# Patient Record
Sex: Female | Born: 1980 | Race: White | Hispanic: No | Marital: Single | Smoking: Former smoker
Health system: Southern US, Community
[De-identification: ages and names within clinical notes are randomized; demographics above are authoritative.]

## PROBLEM LIST (undated history)

## (undated) ENCOUNTER — Emergency Department (HOSPITAL_COMMUNITY): Discharge status: Absent without leave | Payer: Medicaid Other

## (undated) DIAGNOSIS — R112 Nausea with vomiting, unspecified: Secondary | ICD-10-CM

## (undated) DIAGNOSIS — N390 Urinary tract infection, site not specified: Secondary | ICD-10-CM

## (undated) DIAGNOSIS — R12 Heartburn: Secondary | ICD-10-CM

## (undated) DIAGNOSIS — N312 Flaccid neuropathic bladder, not elsewhere classified: Secondary | ICD-10-CM

## (undated) DIAGNOSIS — D699 Hemorrhagic condition, unspecified: Secondary | ICD-10-CM

## (undated) DIAGNOSIS — I10 Essential (primary) hypertension: Secondary | ICD-10-CM

## (undated) DIAGNOSIS — Z973 Presence of spectacles and contact lenses: Secondary | ICD-10-CM

## (undated) DIAGNOSIS — F419 Anxiety disorder, unspecified: Secondary | ICD-10-CM

## (undated) DIAGNOSIS — L8990 Pressure ulcer of unspecified site, unspecified stage: Secondary | ICD-10-CM

## (undated) DIAGNOSIS — M62838 Other muscle spasm: Secondary | ICD-10-CM

## (undated) DIAGNOSIS — A419 Sepsis, unspecified organism: Secondary | ICD-10-CM

## (undated) DIAGNOSIS — Q02 Microcephaly: Secondary | ICD-10-CM

## (undated) DIAGNOSIS — D509 Iron deficiency anemia, unspecified: Secondary | ICD-10-CM

## (undated) DIAGNOSIS — E871 Hypo-osmolality and hyponatremia: Secondary | ICD-10-CM

## (undated) DIAGNOSIS — E039 Hypothyroidism, unspecified: Secondary | ICD-10-CM

## (undated) DIAGNOSIS — G839 Paralytic syndrome, unspecified: Secondary | ICD-10-CM

## (undated) DIAGNOSIS — J189 Pneumonia, unspecified organism: Secondary | ICD-10-CM

## (undated) DIAGNOSIS — Z9889 Other specified postprocedural states: Secondary | ICD-10-CM

## (undated) DIAGNOSIS — E785 Hyperlipidemia, unspecified: Secondary | ICD-10-CM

## (undated) DIAGNOSIS — K029 Dental caries, unspecified: Secondary | ICD-10-CM

## (undated) DIAGNOSIS — M4628 Osteomyelitis of vertebra, sacral and sacrococcygeal region: Secondary | ICD-10-CM

## (undated) DIAGNOSIS — G629 Polyneuropathy, unspecified: Secondary | ICD-10-CM

## (undated) DIAGNOSIS — F32A Depression, unspecified: Secondary | ICD-10-CM

## (undated) DIAGNOSIS — K8050 Calculus of bile duct without cholangitis or cholecystitis without obstruction: Secondary | ICD-10-CM

## (undated) DIAGNOSIS — F329 Major depressive disorder, single episode, unspecified: Secondary | ICD-10-CM

## (undated) DIAGNOSIS — E079 Disorder of thyroid, unspecified: Secondary | ICD-10-CM

## (undated) DIAGNOSIS — R488 Other symbolic dysfunctions: Secondary | ICD-10-CM

## (undated) DIAGNOSIS — K219 Gastro-esophageal reflux disease without esophagitis: Secondary | ICD-10-CM

## (undated) DIAGNOSIS — N83201 Unspecified ovarian cyst, right side: Secondary | ICD-10-CM

## (undated) DIAGNOSIS — G8220 Paraplegia, unspecified: Secondary | ICD-10-CM

## (undated) DIAGNOSIS — K592 Neurogenic bowel, not elsewhere classified: Secondary | ICD-10-CM

## (undated) DIAGNOSIS — M5030 Other cervical disc degeneration, unspecified cervical region: Secondary | ICD-10-CM

## (undated) DIAGNOSIS — M1990 Unspecified osteoarthritis, unspecified site: Secondary | ICD-10-CM

## (undated) DIAGNOSIS — N83202 Unspecified ovarian cyst, left side: Secondary | ICD-10-CM

## (undated) DIAGNOSIS — M5020 Other cervical disc displacement, unspecified cervical region: Secondary | ICD-10-CM

## (undated) HISTORY — DX: Depression, unspecified: F32.A

## (undated) HISTORY — DX: Hyperlipidemia, unspecified: E78.5

## (undated) HISTORY — PX: ADENOIDECTOMY: SUR15

## (undated) HISTORY — PX: TONSILLECTOMY AND ADENOIDECTOMY: SUR1326

## (undated) HISTORY — DX: Heartburn: R12

## (undated) HISTORY — DX: Hemorrhagic condition, unspecified: D69.9

## (undated) HISTORY — PX: INTRATHECAL PUMP IMPLANTATION: SHX1844

## (undated) HISTORY — DX: Major depressive disorder, single episode, unspecified: F32.9

## (undated) HISTORY — PX: BACK SURGERY: SHX140

## (undated) HISTORY — DX: Disorder of thyroid, unspecified: E07.9

## (undated) HISTORY — DX: Morbid (severe) obesity due to excess calories: E66.01

## (undated) HISTORY — PX: OTHER SURGICAL HISTORY: SHX169

## (undated) HISTORY — PX: APPENDECTOMY: SHX54

## (undated) HISTORY — PX: ABDOMINAL HYSTERECTOMY: SHX81

## (undated) HISTORY — PX: OVARIAN CYST REMOVAL: SHX89

---
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
## 2001-10-13 ENCOUNTER — Other Ambulatory Visit: Admission: RE | Admit: 2001-10-13 | Discharge: 2001-10-13 | Payer: Self-pay | Admitting: Obstetrics and Gynecology

## 2003-01-18 ENCOUNTER — Emergency Department (HOSPITAL_COMMUNITY): Admission: EM | Admit: 2003-01-18 | Discharge: 2003-01-19 | Payer: Self-pay | Admitting: Emergency Medicine

## 2003-02-03 ENCOUNTER — Encounter (INDEPENDENT_AMBULATORY_CARE_PROVIDER_SITE_OTHER): Payer: Self-pay | Admitting: Specialist

## 2003-02-03 ENCOUNTER — Ambulatory Visit (HOSPITAL_COMMUNITY): Admission: RE | Admit: 2003-02-03 | Discharge: 2003-02-03 | Payer: Self-pay | Admitting: Gastroenterology

## 2003-02-16 ENCOUNTER — Encounter: Admission: RE | Admit: 2003-02-16 | Discharge: 2003-02-16 | Payer: Self-pay | Admitting: Gastroenterology

## 2003-04-28 ENCOUNTER — Ambulatory Visit (HOSPITAL_COMMUNITY): Admission: RE | Admit: 2003-04-28 | Discharge: 2003-04-28 | Payer: Self-pay | Admitting: *Deleted

## 2003-06-20 ENCOUNTER — Other Ambulatory Visit: Admission: RE | Admit: 2003-06-20 | Discharge: 2003-06-20 | Payer: Self-pay | Admitting: *Deleted

## 2004-08-13 ENCOUNTER — Other Ambulatory Visit: Admission: RE | Admit: 2004-08-13 | Discharge: 2004-08-13 | Payer: Self-pay | Admitting: *Deleted

## 2005-07-16 ENCOUNTER — Encounter: Admission: RE | Admit: 2005-07-16 | Discharge: 2005-07-16 | Payer: Self-pay | Admitting: *Deleted

## 2005-08-27 ENCOUNTER — Other Ambulatory Visit: Admission: RE | Admit: 2005-08-27 | Discharge: 2005-08-27 | Payer: Self-pay | Admitting: *Deleted

## 2006-09-02 ENCOUNTER — Other Ambulatory Visit: Admission: RE | Admit: 2006-09-02 | Discharge: 2006-09-02 | Payer: Self-pay | Admitting: *Deleted

## 2007-09-06 ENCOUNTER — Emergency Department (HOSPITAL_COMMUNITY): Admission: EM | Admit: 2007-09-06 | Discharge: 2007-09-07 | Payer: Self-pay | Admitting: Emergency Medicine

## 2007-09-25 ENCOUNTER — Encounter: Payer: Self-pay | Admitting: Orthopedic Surgery

## 2007-09-25 ENCOUNTER — Emergency Department (HOSPITAL_COMMUNITY): Admission: EM | Admit: 2007-09-25 | Discharge: 2007-09-25 | Payer: Self-pay | Admitting: Emergency Medicine

## 2007-09-29 ENCOUNTER — Ambulatory Visit: Payer: Self-pay | Admitting: Orthopedic Surgery

## 2007-10-18 ENCOUNTER — Ambulatory Visit: Payer: Self-pay | Admitting: Orthopedic Surgery

## 2008-02-24 ENCOUNTER — Ambulatory Visit: Payer: Self-pay | Admitting: Internal Medicine

## 2008-03-30 ENCOUNTER — Other Ambulatory Visit: Admission: RE | Admit: 2008-03-30 | Discharge: 2008-03-30 | Payer: Self-pay | Admitting: Internal Medicine

## 2008-03-30 ENCOUNTER — Ambulatory Visit: Payer: Self-pay | Admitting: Internal Medicine

## 2008-05-26 ENCOUNTER — Ambulatory Visit: Payer: Self-pay | Admitting: Internal Medicine

## 2008-07-13 ENCOUNTER — Ambulatory Visit: Payer: Self-pay | Admitting: Internal Medicine

## 2008-07-13 ENCOUNTER — Encounter: Admission: RE | Admit: 2008-07-13 | Discharge: 2008-07-13 | Payer: Self-pay | Admitting: Internal Medicine

## 2008-09-25 ENCOUNTER — Ambulatory Visit: Payer: Self-pay | Admitting: Internal Medicine

## 2009-04-06 ENCOUNTER — Ambulatory Visit: Payer: Self-pay | Admitting: Internal Medicine

## 2009-09-14 ENCOUNTER — Ambulatory Visit: Payer: Self-pay | Admitting: Internal Medicine

## 2010-03-25 ENCOUNTER — Encounter: Payer: Self-pay | Admitting: Internal Medicine

## 2010-04-01 ENCOUNTER — Ambulatory Visit: Admit: 2010-04-01 | Payer: Self-pay | Admitting: Internal Medicine

## 2010-04-12 ENCOUNTER — Encounter (INDEPENDENT_AMBULATORY_CARE_PROVIDER_SITE_OTHER): Payer: Medicaid Other | Admitting: Internal Medicine

## 2010-04-12 DIAGNOSIS — E039 Hypothyroidism, unspecified: Secondary | ICD-10-CM

## 2010-05-19 ENCOUNTER — Emergency Department (HOSPITAL_COMMUNITY): Payer: Medicaid Other

## 2010-05-19 ENCOUNTER — Emergency Department (HOSPITAL_COMMUNITY)
Admission: EM | Admit: 2010-05-19 | Discharge: 2010-05-19 | Disposition: A | Discharge status: Home / Self Care | Payer: Medicaid Other | Attending: Emergency Medicine | Admitting: Emergency Medicine

## 2010-05-19 DIAGNOSIS — Y92410 Unspecified street and highway as the place of occurrence of the external cause: Secondary | ICD-10-CM | POA: Insufficient documentation

## 2010-05-19 DIAGNOSIS — IMO0002 Reserved for concepts with insufficient information to code with codable children: Secondary | ICD-10-CM | POA: Insufficient documentation

## 2010-05-19 DIAGNOSIS — S0990XA Unspecified injury of head, initial encounter: Secondary | ICD-10-CM | POA: Insufficient documentation

## 2010-05-19 DIAGNOSIS — S5000XA Contusion of unspecified elbow, initial encounter: Secondary | ICD-10-CM | POA: Insufficient documentation

## 2010-05-19 DIAGNOSIS — M549 Dorsalgia, unspecified: Secondary | ICD-10-CM | POA: Insufficient documentation

## 2010-06-27 ENCOUNTER — Ambulatory Visit: Payer: No Typology Code available for payment source | Admitting: Internal Medicine

## 2010-07-19 NOTE — Op Note (Signed)
NAME:  [name], [name]                       ACCOUNT NO.:  [id number]   MEDICAL RECORD NO.:  [id number]                   PATIENT TYPE:  AMB   LOCATION:  SDC                                  FACILITY:  [facility]   PHYSICIAN:  Staples B. Earlene Plater, M.D.               DATE OF BIRTH:  [birth year]   DATE OF PROCEDURE:  04/28/2003  DATE OF DISCHARGE:                                 OPERATIVE REPORT   PREOPERATIVE DIAGNOSIS:  Chronic pelvic pain.   POSTOPERATIVE DIAGNOSIS:  Chronic pelvic pain.   PROCEDURE:  Open diagnostic laparoscopy.   SURGEON:  Chester Holstein. Earlene Plater, M.D.   ANESTHESIA:  General.   FINDINGS:  Normal-appearing uterus, tubes, and ovaries.  Single 1 mm area of  scarring in the right ovarian fossa indicative of possible previous  endometriosis.  Also minor adhesions around the terminal ileum (history of  previous appendectomy).   ESTIMATED BLOOD LOSS:  25 mL.   COMPLICATIONS:  None.   INDICATIONS:  Patient with a long history of lower abdominal pain, previous  appendectomy, and previous history of presumed ileitis based on  leukocytosis, abdominal pain, fever, and findings on CT scan.  These  symptoms resolved but had a persistent elevated white count with a normal CT  scan prior to surgery.  Ultrasound was normal.  Informed the patient that  endometriosis could not be ruled out to explain her pain, and she therefore  presented for laparoscopy.   DESCRIPTION OF PROCEDURE:  Patient taken to the operating room and general  anesthesia obtained.  She was prepped and draped in standard fashion and a  Foley catheter used to empty the bladder.  Heparin 5000 units was given  subcu for DVT prophylaxis.  A single-tooth attached to the anterior lip of  the cervix and a Hulka tenaculum inserted and attached.   Attention turned to the abdomen.  A 10 mm vertical infraumbilical skin fold  incision made with a knife and carried sharply to the fascia.  The fascia  was divided sharply and  elevated with the Kocher clamps.  The posterior  sheath and peritoneum were entered sharply.  A pursestring suture of 0  Vicryl placed around the fascial defect, Hasson cannula inserted and  secured, pneumoperitoneum obtained with CO2 gas.   A 5 mm port placed in the left lower quadrant under direct laparoscopic  visualization.   Pelvis and abdomen inspected with the above findings noted.  As there was no  evidence of any active endometriosis, I decided not to treat the single area  that looked like an old scar from endometriosis in the right ovarian fossa.  The adhesions around the terminal ileum are presumably due to previous  appendectomy.  The bowel itself appeared normal, and the adhesions were very  filmy and therefore left untreated.  The upper abdomen appeared normal.  Given there was no active pathology to treat, the procedure was terminated.   The  inferior port was removed and the site inspected with the laparoscope  and found to be hemostatic.  The scope was removed and gas released and  Hasson cannula removed.  I inserted my index finger through the fascial  defect and snugged down the pursestring suture.  This obliterated the  fascial defect and no intra-abdominal contents herniated through prior to  closure.  Skin was closed in each site with 4-0 Vicryl in a subcuticular  fashion.   The alcohol was removed and cervix hemostatic.   The patient tolerated the procedure well and there were no complications.  She was taken to recovery awake and alert, in stable condition.                                               Gerri Spore B. Earlene Plater, M.D.    WBD/MEDQ  D:  04/28/2003  T:  04/28/2003  Job:  [id number]

## 2010-07-19 NOTE — H&P (Signed)
[facility]  Patient:    [name], [name]                    MRN: [id number] Adm. Date:  [id number] Attending:  Kelvin Cellar CC:         Sung Amabile. Roslyn Smiling, M.D.  Lindaann Slough, M.D.  Harlon Ditty, M.D., [facility]   History and Physical  REASON FOR ADMISSION:  Acute abdomen.  BRIEF HISTORY:  Patient is an 30 year old white female with a complex two month history of illness.  She is referred at this time by Dr. Harlon Ditty of [facility].  The patient had initially presented approximately two months ago with sinusitis and cough.  She also developed abdominal pain. She was seen at that time by Dr. Brunilda Payor for cystoscopy which was negative. Patient had persistent abdominal pain and was seen back at [facility].  She underwent CT scan of the abdomen which was nonspecific in its findings.  Patient subsequently had an ultrasound performed for nausea and vomiting that did not demonstrate any biliary disease.  She was evaluated by Dr. Jake Church.  She was placed on oral Cipro for the past two weeks.  She completed her course of antibiotics four days ago.  She now has low grade fever of 99.1 with shaking chills.  White blood cell count is elevated at 17,000 with a left shift.  Patient complains of right lower quadrant abdominal pain.  Patient was seen this evening at [facility] by Dr. Hurley Cisco and referred to me for surgical evaluation here in the [facility] Emergency Department.  She had been seen previously by my partner, Dr. Ovidio Kin, at our office and at [facility] Emergency Department in the past 2-4 weeks.  Patient complains of right lower quadrant abdominal pain and suprapubic pain. She notes nausea and vomiting.  She has had a low grade fever.  She has shaking chills.  She was able to eat a slice of pizza at 2 p.m. today, but shortly thereafter, had emesis.  PAST MEDICAL HISTORY:  Essentially  unremarkable.  MEDICATIONS:  None.  ALLERGIES:  None known.  SOCIAL HISTORY:  Patient is a Holiday representative at [employer].  She lives in [place].  She does not smoke.  She does not drink alcohol.  She does not use drugs.  She is not sexually active.  She is accompanied by her mother and father.  REVIEW OF SYSTEMS:  Fifteen system review without significant other positives.  FAMILY HISTORY:  Notable for colon cancer in the patients grandfather.  PHYSICAL EXAMINATION:  GENERAL:  An 30 year old moderately obese white female on a stretcher in the Emergency Department in moderate discomfort.  VITAL SIGNS:  Temperature 97.4, pulse 88, respirations 18, blood pressure 141/77.  HEENT:  Normocephalic.  Sclerae are clear.  Mucous membranes are dry. Dentition is good.  NECK:  Supple without masses.  Thyroid is normal without nodularity.  LUNGS:  Clear to auscultation bilaterally.  There was no costovertebral angle tenderness.  CARDIAC:  Regular rate and rhythm without murmur.  ABDOMEN:  Soft.  There are a few bowel sounds present.  There is tenderness to percussion in the suprapubic and right lower quadrant.  There is tenderness to palpation, suprapubic, and right lower quadrant.  There are no palpable masses.  There is no hepatosplenomegaly.  There is rebound tenderness in the suprapubic and right lower quadrant areas.  EXTREMITIES:  Nontender without edema.  NEUROLOGIC:  Patient is alert and oriented to person, place, and time without focal neurologic deficit.  LABORATORY STUDIES:  CBC from [facility] done this evening shows a white blood cell count of 17,400 with 73% granulocytes and 20% lymphocytes. Hemoglobin is 12.2, hematocrit 36.8%, platelet count 555,000.  Urinalysis is benign.  Chemistry profile is pending.  RADIOGRAPHIC STUDIES:  None.  IMPRESSION:  Acute abdomen, rule out acute appendicitis versus gynecologic infection.  PLAN: 1.  Admission to [facility]. 2. Initiation of intravenous antibiotics. 3. To operating room for diagnostic laparoscopy and possibly laparoscopic    appendectomy. 4. Routine postoperative care.  I had a lengthy discussion with the patient, her mother, and her father regarding options for further management.  She has had a complicated course over the past two months with intermittent treatment with oral antibiotics. She has had multiple diagnostic studies without a clear cut diagnosis. Patient now presents with abdominal pain, chills, low grade fever, nausea, and vomiting.  I think the best course of action is a direct approach with diagnostic laparoscopy for diagnosis and possible therapeutic intervention for appendectomy.  The alternative to surgery would be a repeat CT scan of the abdomen and clinical observation here in the hospital.  Both the patient, her mother, and her father desire the surgical approach.  We will make arrangements with the operating room at this time.  Risks and benefits of the procedure have been discussed.  Potential for conversion to an open procedure has been discussed and understood.  We will proceed to the operating room on an urgent basis. DD:  01/17/00 TD:  01/18/00 Job: [id number]

## 2010-07-19 NOTE — Op Note (Signed)
[facility]  Patient:    [name], [name]                    MRN: [id number] Proc. Date: 01/17/00 Adm. Date:  [id number] Attending:  Gustavo Lah A CC:         Dr. Harlon Ditty of [facility]  Lindaann Slough, M.D.  Sung Amabile Roslyn Smiling, M.D.   Operative Report  PREOPERATIVE DIAGNOSIS:  Acute abdomen.  POSTOPERATIVE DIAGNOSES: 1. Right ovarian cyst. 2. Normal retrocecal appendix.  PROCEDURE: 1. Diagnostic laparoscopy. 2. Fenestration of right ovarian cyst. 3. Laparoscopic appendectomy.  SURGEON:  Velora Heckler, M.D.  ANESTHESIA:  General.  ESTIMATED BLOOD LOSS:  Minimal.  PREPARATION:  Betadine.  COMPLICATIONS:  None.  INDICATIONS:  The patient is an 30 year old white female with complicated two month history of abdominal pain.  She has been on and off antibiotics.  She has had an extensive workup including CT scan of the abdomen, abdominal ultrasound, and cystoscopy.  The patient now presents four days after discontinuing oral Ciprofloxacin with right lower quadrant abdominal pain, nausea, vomiting, and leukocytosis.  The patient is brought to the operating room for diagnostic laparoscopy.  DESCRIPTION OF PROCEDURE:  The procedure was done at [facility] in operating room #11.  The patient was brought to the operating room and placed in the supine position on the operating room table.  Following the administration of general anesthesia, the patient was prepped and draped in the usual strict aseptic fashion.  After ascertaining that an adequate level of anesthesia had been obtained, a supraumbilical incision was made with a #15 blade.  Dissection was carried down to the fascia.  The fascia was incised in the midline and the peritoneal cavity was entered cautiously.  An 0 Vicryl pursestring suture was placed in the fascia.  A Hasson cannula was introduced under direct vision and secured with a pursestring  suture.  The abdomen was insufflated with carbon dioxide.  The laparoscope was introduced under direct vision and the abdomen explored.  With positioning of the patient and exploration and manipulation of the bowel using a Glassman clamp placed through a right abdominal wall 5 mm port, the appendix was identified.  It is retrocecal.  It is grossly normal.  Terminal ileum is normal.  In the pelvis, the uterus is normal.  There is a large right ovarian cyst.  The left ovary and tube are normal.  Sigmoid colon is normal.  Liver is normal.  Gallbladder is essentially normal.  Left lateral segment of the liver and the stomach are normal.  Spleen is not visualized.  Representative photographs were made for the medical record.  The second operative port is placed in the left lower quadrant.  Peritoneal attachments around the cecum are mobilized.  The appendix is mobilized from its retrocecal location.  A window is created at the base of the appendix.  The base of the appendix was transected with an Endo-GIA stapler.  The appendiceal mesentery is dissected out and the mesentery transected with an Endo-GIA stapler using a vascular load of staples.  Remaining adventitial tissue to the appendix was dissected out using the electrocautery for hemostasis.  The appendix was placed into an Endocatch bag and withdrawn from the peritoneal cavity.  Next, we turned our attention to the pelvis.  The right ovary was elevated. Using the electrocautery, the cyst was fenestrated and clear fluid is evacuated from the right ovarian cyst.  Hemostasis was obtained  with the electrocautery.  Again, representative photographs were made for the medical record.  All ports were then removed under direct vision.  Port sites were anesthetized with local anesthetic.  The Hasson is removed and the 0 Vicryl pursestring suture tied securely.  All three surgical wounds were closed with interrupted 4-0 Vicryl subcuticular  sutures.  The wounds were washed and dried, and Benzoin and Steri-Strips are applied.  Sterile gauze dressings are applied.  The patient is awakened from anesthesia and brought to the recovery room in stable condition.  The patient tolerated the procedure well. DD:  01/17/00 TD:  01/18/00 Job: [id number]

## 2010-07-19 NOTE — H&P (Signed)
[facility]  Patient:    [name], [name]                    MRN: [id number] Adm. Date:  [id number] Disc. Date: [id number] Attending:  Lindaann Slough CC:         Louanna Raw, M.D., [facility], [address] [place], [place]   History and Physical  DATE OF BIRTH: [birth year]  CHIEF COMPLAINT: Ms. Manansala is a 30 year old white female, who goes to [facility] for her primary care.  She has otherwise been healthy up until three to four weeks ago and has had some really irregular periods. She has had some bleeding off and on over the last three or four weeks.  Then approximately five days ago, on December 13, 1999, she developed abdominal pain.  She kind of points to the right lower quadrant.  This pain has been kind of persistent for the past four or five days and got bad enough today in school that she was taken out by her mother and brought to Urgent Care.  She says her appetite has been poor.  She has not been really nauseated to today.  Her bowels have been normal.  She thinks she may have had some fever but she never had any documented febrile episode.  She was seen by Dr. Harlon Ditty at Urgent Care, who felt she may have appendicitis and asked me to see to see her.  She has no gastrointestinal history such as peptic ulcer disease, liver disease, pancreatic disease, or change in bowel habits.  She has had no prior abdominal surgery.  She denies any sexual activity.  She started her first period at age age 80 but, again, has had irregular menstrual periods the last three to four weeks.  Again, she is not sexually active.  ALLERGIES: No known drug allergies.  CURRENT MEDICATIONS: She is on no medications.  REVIEW OF SYSTEMS: Negative for significant pulmonary, cardiac, gastrointestinal, or urologic problems.  She is accompanied by her mother in the emergency room.  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  PHYSICAL EXAMINATION:  VITAL SIGNS: Temperature 98.3 degrees, pulse 87, respirations 20, blood pressure 152/90.  GENERAL: She is a well-nourished, mildly obese white female, alert and cooperative on physical examination.  HEENT: Unremarkable.  NECK: Supple, without masses or thyromegaly.  LUNGS: Clear to auscultation.  HEART: Regular rate and rhythm.  ABDOMEN: Soft.  Bowel sounds noted in all four quadrants.  She is tender in the right lower quadrant.  There is no rebound or guarding but she is tender in that area.  PELVIC: I tried to do a vaginal examination but she really did not tolerate that at all.  RECTAL: I did not do a rectal examination.  EXTREMITIES: Good strength in all four extremities.  LABORATORY DATA: WBC here is 16,100.  IMPRESSION/PLAN: I doubt she has appendicitis.  I think with her presentation and symptoms she would be best served by obtaining a CT scan.  I think this is probably more gynecologic in nature.  The elevated WBC is of concern.  I discussed this with the mother and we will proceed with CT scan.  If negative, she will be discharged home with pain medication and if positive she obviously will need further something further done. DD:  12/18/99 TD:  12/18/99 Job: [id number]

## 2010-07-19 NOTE — Op Note (Signed)
NAME:  [name], [name]                       ACCOUNT NO.:  [id number]   MEDICAL RECORD NO.:  [id number]                   PATIENT TYPE:  AMB   LOCATION:  ENDO                                 FACILITY:  [facility]   PHYSICIAN:  Danise Edge, M.D.                DATE OF BIRTH:  1981/01/25   DATE OF PROCEDURE:  02/04/2003  DATE OF DISCHARGE:                                 OPERATIVE REPORT   PROCEDURE:  Colonoscopy.   INDICATIONS FOR PROCEDURE:  Ms. Lillyonna A. Glasby is a 30 year old female  born 1981/01/20.  On January 18, 2003, Ms. Folsom presented to the  [facility] emergency room with right lower quadrant abdominal  pain, nausea and vomiting.  She was evaluated by Dr. Mancel Bale.  Her  white blood cell count was 20,300, hemoglobin 12.7 g, platelet count  500,000.  Her complete metabolic profile and urinalysis were normal.  Qualitative urine pregnancy test was negative.  RPR was nonreactive.  Cervical screen for yeast, trichomonas, gonorrhea and chlamydia negative.  CT scan of the abdomen and pelvis revealed thickening of the wall in the  terminal ileum and nonspecific mesenteric lymphadenopathy.  Ms. Creek was  discharged from the emergency room on Cipro and I evaluated her in my office  January 24, 2003.  She reported right lower quadrant abdominal pain and  back pain.  She also has abnormal menses.  She denied dysuria, hematuria,  gastrointestinal bleeding, diarrhea or constipation.  She was only taking  Cipro.   In July 2000, Kristel developed predominantly right lower quadrant abdominal  pain.  Her September 05, 1999 intravenous pyelogram was normal.  Her September 11, 1999  and Jul 17, 2000 abdominal ultrasounds were normal.  Her October 01, 1999,  December 18, 1999 and Jul 23, 2000 CT scan of the abdomen and pelvis were  normal.   On September 13, 1999, she underwent cystoscopy performed by Lindaann Slough,  M.D. to evaluate hematuria and the exam was normal.   On  January 17, 2000, Ms. Casco underwent a diagnostic laparoscopy with  appendectomy and fenestration of a large right ovarian cyst performed by  Velora Heckler, M.D. and Sung Amabile. Roslyn Smiling, M.D.   ALLERGIES:  No known drug allergies.   CURRENT MEDICATIONS:  None.   FAMILY HISTORY:  Colon cancer in grandparents.  I performed a colonoscopy on  Julieta's father and his exam was normal.   HABITS:  Teanna does not smoke cigarettes or consume alcohol.   ENDOSCOPIST:  Danise Edge, M.D.   PREMEDICATION:  Versed 10 mg, Demerol 100 mg.   DESCRIPTION OF PROCEDURE:  After obtaining informed consent, Ms. Santarelli was  placed in the left lateral decubitus position. I administered intravenous  Demerol and intravenous Versed to achieve conscious sedation for the  procedure. The patient's blood pressure, oxygen saturation and cardiac  rhythm were monitored throughout the procedure and documented in the medical  record.   Anal inspection was normal. Digital rectal exam was normal. The Olympus  adjustable pediatric colonoscope was introduced into the rectum and advanced  to the cecum.  The lip of the ileocecal valve had a bumpy mucosal appearance  without inflammation.  The ileocecal valve was intubated and the distal  ileum inspected.  The ileal mucosa appeared normal and biopsies were taken.  Colonic preparation for the exam today was excellent.   RECTUM:  Normal.   SIGMOID COLON AND DESCENDING COLON:  Normal.   SPLENIC FLEXURE:  Normal.   TRANSVERSE COLON:  Normal.   HEPATIC FLEXURE:  Normal.   ASCENDING COLON:  Normal.   CECUM:  Normal.   ILEOCECAL VALVE:  The inferior lip of the ileocecal valve had a bumpy  mucosal appearance without inflammation or erosion.   DISTAL ILEUM:  The distal ileal mucosa did not appear inflamed.  The bumpy  mucosal appearance was most consistent with lymphoid hyperplasia.  Multiple  biopsies were taken from the distal ileum and along the inferior lip of  the  ileocecal valve.   ASSESSMENT:  Essentially normal colonoscopy with distal ileoscopy in a  patient whose CT scan shows distal ileitis and whose white blood cell count  in the [facility] Emergency Room January 18, 2003 was 20,300.   PLAN:  Biopsies then performed looking for signs of Crohns disease.  She  could have had an infectious ileitis which prompted her emergency room visit  and has resolved after her course of Cipro.  If her biopsies from the distal  ileum are normal and she continues to have pain, I will schedule her for a  capsule enteroscopy looking for signs of Crohns disease.                                               Danise Edge, M.D.    MJ/MEDQ  D:  02/03/2003  T:  02/04/2003  Job:  [id number]   cc:   Sung Amabile. Roslyn Smiling, M.D.  [address]  [place]  [place] [address]  Fax: [phone]   Lindaann Slough, M.D.  [address]  [place]  [place] [address]  Fax: [phone]   Velora Heckler, M.D.  [address] [place]  [place] [address]  Fax: [phone]

## 2010-07-19 NOTE — Discharge Summary (Signed)
[facility]  Patient:    [name], [name]                    MRN: [id number] Adm. Date:  [id number] Disc. Date: 01/02/00 Attending:  Kelvin Cellar CC:         Sung Amabile. Roslyn Smiling, M.D.  Lindaann Slough, M.D.  Harlon Ditty, M.D., [facility]   Discharge Summary  REASON FOR ADMISSION:  Abdominal pain, rule out acute abdomen.  BRIEF HISTORY:  The patient is an 30 year old white female with a complicated two month history of abdominal pain.  The patient has had an extensive work-up including CT scan of the abdomen, ultrasound of abdomen, and cystoscopy.  The patient has been on and off of antibiotics.  She has been seen in consultation by urology and gynecology as well as general surgery.  The patient returns to the emergency room on January 17, 2000, after being off of Cipro oral antibiotics for four days.   She has developed a low-grade fever of 99.1 with a white count of 17,000.  She complains of right lower quadrant abdominal pain.  General surgery is again called to see the patient, and the patient is admitted on the general surgical service.  HOSPITAL COURSE:  The patient was admitted on January 17, 2000, and taken to the operating room where she underwent diagnostic laparoscopy.  Findings at surgery included a normal retrocecal appendix which was removed.  There was a large right ovarian cyst which was fenestrated.  Postoperative course was straightforward.  The patient had some initial nausea.  She tolerated clear liquids.  She was advanced to a regular diet.  She was prepared for discharge home on the second postoperative day.  DISCHARGE PLANNING:  The patient is discharged home on January 19, 2000.  She is tolerating a regular diet and ambulating independently.  She will be seen back at my office at [facility] in two weeks.  DISCHARGE MEDICATIONS:  Vicodin as needed for pain.  FINAL DIAGNOSES: 1. Normal  appendix. 2. Large right ovarian cyst.  CONDITION ON DISCHARGE:  Improved. DD:  01/19/00 TD:  01/19/00 Job: [id number]

## 2010-07-19 NOTE — Op Note (Signed)
[place]  Patient:    [name], [name]                    MRN: [id number] Proc. Date: 09/13/99 Adm. Date:  [id number] Attending:  Lindaann Slough                           Operative Report  PREOPERATIVE DIAGNOSIS:  Gross hematuria.  POSTOPERATIVE DIAGNOSIS:  Gross hematuria.  OPERATION PERFORMED:  Cystoscopy.  SURGEON:  Lindaann Slough, M.D.  ANESTHESIA:  INDICATIONS FOR PROCEDURE:  The patient is a 30 year old female who had been complaining of right flank pain and gross hematuria.  An IVP showed normal upper tracts.  She is now scheduled for cystoscopy.  DESCRIPTION OF PROCEDURE:  A #17 Wappler cystoscope was inserted in the bladder.  The bladder mucosa is normal.  There is no stone or tumor in the bladder.  The ureteral orifices are in normal position and shape with clear efflux.  There was no evidence of submucosal hemorrhage.  The bladder was emptied and the cystoscope removed.  The patient tolerated the procedure well and left the operating room in satifactory condition to post anesthesia care unit. DD:  09/13/99 TD:  09/13/99 Job: [id number]

## 2010-07-19 NOTE — H&P (Signed)
NAME:  [name], CHERAMIE                       ACCOUNT NO.:  [id number]   MEDICAL RECORD NO.:  [id number]                   PATIENT TYPE:  AMB   LOCATION:  SDC                                  FACILITY:  [facility]   PHYSICIAN:  [place] B. Earlene Plater, M.D.               DATE OF BIRTH:  1980-04-18   DATE OF ADMISSION:  DATE OF DISCHARGE:                                HISTORY & PHYSICAL   DATE OF PROCEDURE:  April 28, 2003   PREOPERATIVE DIAGNOSIS:  Chronic pelvic pain.   INTENDED PROCEDURE:  Diagnostic laparoscopy, possible CO2 laser ablation of  endometriosis.   HISTORY OF PRESENT ILLNESS:  A 30 year old white female gravida 0 with a  long history (over 2 years) of bilateral lower quadrant pain.  History of  recurrent ovarian cysts.  Also recently with a history of possible ileitis  diagnosed by leukocytosis and CT findings.  This was treated with Cipro and  the CT scan showed resolution of her ileitis.  However, she had a  persistently-elevated leukocytosis that has otherwise been unexplained at  this point.  Most recent CT scan was April 07, 2003 and showed no  abnormalities.  Given the persistent nature of the patient's pain and  unclear diagnosis she presents for a diagnostic laparoscopy.   PAST MEDICAL HISTORY:  Obesity and developmental delay.  The patient's  mother is intimately involved in her health care and decision making.   PAST SURGICAL HISTORY:  Appendectomy in November 2001.   FAMILY HISTORY:  Colon cancer.   SOCIAL HISTORY:  The patient smokes.  No alcohol or other drugs.  Family  history otherwise negative.   REVIEW OF SYSTEMS:  Otherwise negative.   PHYSICAL EXAMINATION:  VITAL SIGNS:  Blood pressure 120/84, weight 256,  height 5 feet 4 inches.  GENERAL:  Alert and oriented, no acute distress.  SKIN:  Warm and dry, no lesions.  HEART:  Regular rate and rhythm.  LUNGS:  Clear to auscultation.  ABDOMEN:  Liver and spleen normal, no hernia.  PELVIC:  Normal  external genitalia, vagina and cervix normal.  Uterus normal  size, nontender.  No adnexal masses or tenderness.   ASSESSMENT:  Persistent bilateral lower quadrant abdominal pain, cannot rule  out gynecological condition such as endometriosis or pelvic scarring.  The  patient desires definitive diagnosis if possible.   PLAN:  Diagnostic laparoscopy, CO2 ablation of endometriosis if identified.  Operative risks discussed including infection; bleeding; damage to bowel,  bladder, or surrounding organs.  All questions answered.  The patient wishes  to proceed.                                               Gerri Spore B. Earlene Plater, M.D.    WBD/MEDQ  D:  04/28/2003  T:  04/28/2003  Job:  [id number]

## 2010-08-02 ENCOUNTER — Ambulatory Visit: Payer: No Typology Code available for payment source | Admitting: Internal Medicine

## 2010-08-06 ENCOUNTER — Encounter: Payer: Self-pay | Admitting: Internal Medicine

## 2010-08-06 ENCOUNTER — Ambulatory Visit (INDEPENDENT_AMBULATORY_CARE_PROVIDER_SITE_OTHER): Payer: No Typology Code available for payment source | Admitting: Internal Medicine

## 2010-08-06 VITALS — BP 106/82 | HR 84 | Temp 97.6°F | Ht 65.75 in | Wt 258.0 lb

## 2010-08-06 DIAGNOSIS — E039 Hypothyroidism, unspecified: Secondary | ICD-10-CM | POA: Insufficient documentation

## 2010-08-06 DIAGNOSIS — F339 Major depressive disorder, recurrent, unspecified: Secondary | ICD-10-CM | POA: Insufficient documentation

## 2010-08-06 DIAGNOSIS — F329 Major depressive disorder, single episode, unspecified: Secondary | ICD-10-CM

## 2010-08-06 DIAGNOSIS — F70 Mild intellectual disabilities: Secondary | ICD-10-CM | POA: Insufficient documentation

## 2010-08-06 DIAGNOSIS — E669 Obesity, unspecified: Secondary | ICD-10-CM | POA: Insufficient documentation

## 2010-08-06 DIAGNOSIS — F17200 Nicotine dependence, unspecified, uncomplicated: Secondary | ICD-10-CM | POA: Insufficient documentation

## 2010-08-06 LAB — TSH: TSH: 4.307 u[IU]/mL (ref 0.350–4.500)

## 2010-08-06 NOTE — Patient Instructions (Signed)
Take meds as directed. Return Feb 2013 for PE

## 2010-08-06 NOTE — Progress Notes (Signed)
  Subjective:    [name], female    DOB: Jan 24, 1981, 30 y.o.   MRN: [id number]  HPI in today for followup of depression and hypothyroidism. Seems to be doing better with regard to depression. At last visit was having considerable lethargy and sleeping long hours with little motivation. She says that she is getting up earlier in the day and trying to help around the house. History of mild mental retardation. She is on disability. Cannot seem to hold a job due to mental handicap.    Review of Systems     Objective:   Physical Exam Neck: no JVD, no thyromegaly; Chest: clear; Cardiac exam: regular rate and rhythm no murmurs appreciated        Assessment & Plan:  1-depression stable on Prozac  2 hypothyroidism TSH drawn and is pending  3-obesity not motivated to diet and exercise  4 cigarette abuse not willing to stop smoking  5-mild mental retardation  Plan return February 2013 for physical examination

## 2010-08-07 ENCOUNTER — Telehealth: Payer: Self-pay

## 2010-08-07 NOTE — Telephone Encounter (Signed)
Patient informed of lab results today. TSH 4.2 . She is to remain on same dose of Synthroid and take it in a.m. On an empty stomach. Return in 4 weeks for TSH again. Scheduled for [facility]

## 2010-09-05 ENCOUNTER — Other Ambulatory Visit: Payer: No Typology Code available for payment source | Admitting: Internal Medicine

## 2010-09-05 DIAGNOSIS — E039 Hypothyroidism, unspecified: Secondary | ICD-10-CM

## 2010-09-05 LAB — TSH: TSH: 5.25 u[IU]/mL — ABNORMAL HIGH (ref 0.350–4.500)

## 2010-10-18 ENCOUNTER — Other Ambulatory Visit: Payer: No Typology Code available for payment source | Admitting: Internal Medicine

## 2010-12-30 ENCOUNTER — Emergency Department (HOSPITAL_COMMUNITY)
Admission: EM | Admit: 2010-12-30 | Discharge: 2010-12-31 | Disposition: A | Discharge status: Home / Self Care | Payer: Medicaid Other | Attending: Emergency Medicine | Admitting: Emergency Medicine

## 2010-12-30 ENCOUNTER — Encounter (HOSPITAL_COMMUNITY): Payer: Self-pay | Admitting: *Deleted

## 2010-12-30 DIAGNOSIS — R109 Unspecified abdominal pain: Secondary | ICD-10-CM | POA: Insufficient documentation

## 2010-12-30 HISTORY — DX: Unspecified ovarian cyst, right side: N83.201

## 2010-12-30 HISTORY — DX: Unspecified ovarian cyst, right side: N83.202

## 2010-12-30 LAB — URINALYSIS, ROUTINE W REFLEX MICROSCOPIC
Bilirubin Urine: NEGATIVE
Glucose, UA: NEGATIVE mg/dL
Hgb urine dipstick: NEGATIVE
Ketones, ur: NEGATIVE mg/dL
Leukocytes, UA: NEGATIVE
Nitrite: NEGATIVE
Protein, ur: NEGATIVE mg/dL
Specific Gravity, Urine: 1.025 (ref 1.005–1.030)
Urobilinogen, UA: 0.2 mg/dL (ref 0.0–1.0)
pH: 6 (ref 5.0–8.0)

## 2010-12-30 LAB — PREGNANCY, URINE: Preg Test, Ur: NEGATIVE

## 2010-12-30 MED ORDER — MORPHINE SULFATE 4 MG/ML IJ SOLN
4.0000 mg | Freq: Once | INTRAMUSCULAR | Status: AC
  Administered 2010-12-30: 4 mg via INTRAVENOUS
  Filled 2010-12-30: qty 1

## 2010-12-30 MED ORDER — ONDANSETRON HCL 4 MG/2ML IJ SOLN
4.0000 mg | Freq: Once | INTRAMUSCULAR | Status: AC
  Administered 2010-12-30: 4 mg via INTRAVENOUS
  Filled 2010-12-30: qty 2

## 2010-12-30 MED ORDER — SODIUM CHLORIDE 0.9 % IV BOLUS (SEPSIS)
1000.0000 mL | Freq: Once | INTRAVENOUS | Status: AC
  Administered 2010-12-30: 1000 mL via INTRAVENOUS

## 2010-12-30 NOTE — ED Notes (Signed)
Family at bedside. Patient wants a glass of water RN notified.

## 2010-12-30 NOTE — ED Notes (Signed)
Pt c/o pain in her lower abdomen that is mostly on the right but shoots over to the left side at times. Pt denies nausea, vomiting, diarrhea, urinary symptoms or vaginal discharge.

## 2010-12-31 LAB — BASIC METABOLIC PANEL
BUN: 3 mg/dL — ABNORMAL LOW (ref 6–23)
CO2: 27 mEq/L (ref 19–32)
Calcium: 9.1 mg/dL (ref 8.4–10.5)
Chloride: 99 mEq/L (ref 96–112)
Creatinine, Ser: 0.65 mg/dL (ref 0.50–1.10)
GFR calc Af Amer: >90 mL/min (ref >90)
GFR calc non Af Amer: >90 mL/min (ref >90)
Glucose, Bld: 92 mg/dL (ref 70–99)
Potassium: 3.9 mEq/L (ref 3.5–5.1)
Sodium: 135 mEq/L (ref 135–145)

## 2010-12-31 LAB — CBC
HCT: 38.9 % (ref 36.0–46.0)
Hemoglobin: 12.7 g/dL (ref 12.0–15.0)
MCH: 27.6 pg (ref 26.0–34.0)
MCHC: 32.6 g/dL (ref 30.0–36.0)
MCV: 84.6 fL (ref 78.0–100.0)
Platelets: 411 10*3/uL — ABNORMAL HIGH (ref 150–400)
RBC: 4.6 MIL/uL (ref 3.87–5.11)
RDW: 13.5 % (ref 11.5–15.5)
WBC: 13 10*3/uL — ABNORMAL HIGH (ref 4.0–10.5)

## 2010-12-31 MED ORDER — KETOROLAC TROMETHAMINE 30 MG/ML IJ SOLN
30.0000 mg | Freq: Once | INTRAMUSCULAR | Status: AC
  Administered 2010-12-31: 30 mg via INTRAVENOUS
  Filled 2010-12-31: qty 1

## 2010-12-31 MED ORDER — HYDROCODONE-ACETAMINOPHEN 5-325 MG PO TABS: 1.0000 | ORAL_TABLET | ORAL | Status: AC | PRN

## 2010-12-31 MED ORDER — HYDROCODONE-ACETAMINOPHEN 5-325 MG PO TABS
1.0000 | ORAL_TABLET | Freq: Once | ORAL | Status: AC
  Administered 2010-12-31: 1 via ORAL
  Filled 2010-12-31: qty 1

## 2010-12-31 MED ORDER — PROMETHAZINE HCL 25 MG PO TABS: 25.0000 mg | ORAL_TABLET | Freq: Four times a day (QID) | ORAL | Status: DC | PRN

## 2010-12-31 NOTE — ED Provider Notes (Signed)
Medical screening examination/treatment/procedure(s) were conducted as a shared visit with non-physician practitioner(s) and myself.  I personally evaluated the patient during the encounter  Raeford Razor, MD 12/31/10 [phone]

## 2010-12-31 NOTE — ED Provider Notes (Signed)
History     CSN: [id number] Arrival date & time: 12/30/2010 10:07 PM   First MD Initiated Contact with Patient 12/30/10 2256      Chief Complaint  Patient presents with  . Abdominal Pain    (Consider location/radiation/quality/duration/timing/severity/associated sxs/prior treatment) Patient is a 30 y.o. female presenting with abdominal pain. The history is provided by the patient.  Abdominal Pain The primary symptoms of the illness include abdominal pain and vaginal bleeding. The primary symptoms of the illness do not include fever, shortness of breath, nausea, vomiting, dysuria or vaginal discharge. Episode onset: She developed pain 1 week ago which has increased over the past 3 days. The onset of the illness was gradual. The problem has been gradually worsening.  The patient states that she believes she is currently not pregnant. The patient has not had a change in bowel habit. Symptoms associated with the illness do not include heartburn, constipation, urgency or frequency. Associated symptoms comments: She denies nausea,  Vomiting and fever.  She reports her pain reminds her her previous history of having ovarian cysts.  She also has endometriosis.  She denies vaginal discharge and is not sexually active..    Past Medical History  Diagnosis Date  . Depression   . Thyroid disease     hypothyroidism  . Endometriosis   . Morbid obesity   . Bilateral ovarian cysts     Past Surgical History  Procedure Date  . Appendectomy   . Tonsillectomy and adenoidectomy   . Laparoscopic ovarian cystectomy 2002    rt ovary  . Adenoidectomy     History reviewed. No pertinent family history.  History  Substance Use Topics  . Smoking status: Current Everyday Smoker  . Smokeless tobacco: Never Used  . Alcohol Use: No    OB History    Grav Para Term Preterm Abortions TAB SAB Ect Mult Living                  Review of Systems  Constitutional: Negative for fever.  HENT: Negative  for congestion, sore throat and neck pain.   Eyes: Negative.   Respiratory: Negative for chest tightness and shortness of breath.   Cardiovascular: Negative for chest pain.  Gastrointestinal: Positive for abdominal pain. Negative for heartburn, nausea, vomiting and constipation.  Genitourinary: Positive for vaginal bleeding. Negative for dysuria, urgency, frequency, vaginal discharge and vaginal pain.  Musculoskeletal: Negative for joint swelling and arthralgias.  Skin: Negative.  Negative for rash and wound.  Neurological: Negative for dizziness, weakness, light-headedness, numbness and headaches.  Hematological: Negative.   Psychiatric/Behavioral: Negative.     Allergies  Review of patient's allergies indicates no known allergies.  Home Medications   Current Outpatient Rx  Name Route Sig Dispense Refill  . LEVOTHYROXINE SODIUM 100 MCG PO TABS Oral Take 100 mcg by mouth daily.      . NORETHINDRONE 0.35 MG PO TABS Oral Take 1 tablet by mouth daily.      [name] CITALOPRAM HYDROBROMIDE 40 MG PO TABS Oral Take 40 mg by mouth daily.      [name] HYDROCODONE-ACETAMINOPHEN 5-325 MG PO TABS Oral Take 1 tablet by mouth every 4 (four) hours as needed for pain. 15 tablet 0  . LEVOTHYROXINE SODIUM 75 MCG PO TABS Oral Take 75 mcg by mouth daily.      [name] PROMETHAZINE HCL 25 MG PO TABS Oral Take 1 tablet (25 mg total) by mouth every 6 (six) hours as needed for nausea. 15 tablet 0  BP 115/59  Pulse 80  Temp(Src) 97.7 F (36.5 C) (Oral)  Resp 16  Ht 5\' 5"  (1.651 m)  Wt 250 lb (113.399 kg)  BMI 41.60 kg/m2  SpO2 99%  LMP 11/06/2010  Physical Exam  Nursing note and vitals reviewed. Constitutional: She is oriented to person, place, and time. She appears well-developed and well-nourished.  HENT:  Head: Normocephalic and atraumatic.  Eyes: Conjunctivae are normal.  Neck: Normal range of motion.  Cardiovascular: Normal rate, regular rhythm, normal heart sounds and intact distal pulses.     Pulmonary/Chest: Effort normal and breath sounds normal. She has no wheezes.  Abdominal: Soft. Bowel sounds are normal. She exhibits no distension and no mass. There is no hepatosplenomegaly. There is tenderness in the right lower quadrant and suprapubic area. There is no rigidity, no rebound, no guarding and no CVA tenderness. No hernia.  Musculoskeletal: Normal range of motion.  Neurological: She is alert and oriented to person, place, and time.  Skin: Skin is warm and dry.  Psychiatric: She has a normal mood and affect.    ED Course  Procedures (including critical care time)  Labs Reviewed  CBC - Abnormal; Notable for the following:    WBC 13.0 (*)    Platelets 411 (*)    All other components within normal limits  BASIC METABOLIC PANEL - Abnormal; Notable for the following:    BUN 3 (*)    All other components within normal limits  URINALYSIS, ROUTINE W REFLEX MICROSCOPIC  PREGNANCY, URINE  URINE CULTURE   No results found.   1. Abdominal pain       MDM  Discussed lab results with patient.  Exam and history most consistent with ovarian cyst.   No GI symptoms in this patient with appendectomy,  H/o ovarian cyst and endometriosis.  Morphine IV given in ed with moderate relief of pain. Added toradol with extra relief obtained.  Encouraged 12 hour f/u here or by pcp  if sx worsened or not improving.          Candis Musa, PA 12/31/10 0118  Candis Musa, PA 12/31/10 [phone]

## 2011-01-01 LAB — URINE CULTURE
Colony Count: 85000
Culture  Setup Time: 201210301356

## 2011-03-18 ENCOUNTER — Other Ambulatory Visit: Payer: Self-pay | Admitting: Internal Medicine

## 2011-04-15 ENCOUNTER — Ambulatory Visit (INDEPENDENT_AMBULATORY_CARE_PROVIDER_SITE_OTHER): Payer: No Typology Code available for payment source | Admitting: Internal Medicine

## 2011-04-15 ENCOUNTER — Encounter: Payer: Self-pay | Admitting: Internal Medicine

## 2011-04-15 DIAGNOSIS — J029 Acute pharyngitis, unspecified: Secondary | ICD-10-CM

## 2011-04-15 DIAGNOSIS — Z0000 Encounter for general adult medical examination without abnormal findings: Secondary | ICD-10-CM

## 2011-04-15 DIAGNOSIS — N912 Amenorrhea, unspecified: Secondary | ICD-10-CM

## 2011-04-15 DIAGNOSIS — E039 Hypothyroidism, unspecified: Secondary | ICD-10-CM

## 2011-04-15 LAB — POCT RAPID STREP A (OFFICE): Rapid Strep A Screen: NEGATIVE

## 2011-04-15 LAB — CBC WITH DIFFERENTIAL/PLATELET
Eosinophils Absolute: 0.4 10*3/uL (ref 0.0–0.7)
HCT: 43.3 % (ref 36.0–46.0)
Hemoglobin: 13.7 g/dL (ref 12.0–15.0)
Lymphs Abs: 3.8 10*3/uL (ref 0.7–4.0)
MCH: 27.4 pg (ref 26.0–34.0)
Monocytes Absolute: 0.8 10*3/uL (ref 0.1–1.0)
Monocytes Relative: 5 % (ref 3–12)
Neutro Abs: 10.5 10*3/uL — ABNORMAL HIGH (ref 1.7–7.7)
Neutrophils Relative %: 68 % (ref 43–77)
RBC: 5 MIL/uL (ref 3.87–5.11)

## 2011-04-15 LAB — POCT URINALYSIS DIPSTICK
Bilirubin, UA: NEGATIVE
Blood, UA: NEGATIVE
Glucose, UA: NEGATIVE
Ketones, UA: NEGATIVE
Nitrite, UA: NEGATIVE

## 2011-04-15 LAB — COMPREHENSIVE METABOLIC PANEL
Albumin: 4.3 g/dL (ref 3.5–5.2)
CO2: 27 mEq/L (ref 19–32)
Calcium: 9.5 mg/dL (ref 8.4–10.5)
Glucose, Bld: 93 mg/dL (ref 70–99)
Potassium: 4.7 mEq/L (ref 3.5–5.3)
Sodium: 141 mEq/L (ref 135–145)
Total Protein: 6.5 g/dL (ref 6.0–8.3)

## 2011-04-16 ENCOUNTER — Encounter: Payer: Self-pay | Admitting: Internal Medicine

## 2011-04-16 LAB — VITAMIN D 25 HYDROXY (VIT D DEFICIENCY, FRACTURES): Vit D, 25-Hydroxy: 11 ng/mL — ABNORMAL LOW (ref 30–89)

## 2011-04-16 LAB — HCG, QUANTITATIVE, PREGNANCY: hCG, Beta Chain, Quant, S: <2 m[IU]/mL
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
## 2011-04-16 NOTE — Patient Instructions (Signed)
Take Zithromax Z-PAK as directed for URI symptoms. Return in 6 months assuming lab work is normal.  Addendum: Decrease Synthroid to 0.88 mg daily and return in 3 months for TSH and office visit. Take Drisdol 50,000 units weekly for 12 weeks followed by 2000 units vitamin D 3 daily. Patient to return in 3 months for office visit, TSH, vitamin D level.

## 2011-04-18 LAB — TSH: TSH: 0.317 u[IU]/mL — ABNORMAL LOW (ref 0.350–4.500)

## 2011-06-17 ENCOUNTER — Other Ambulatory Visit: Payer: Self-pay | Admitting: Internal Medicine

## 2011-07-18 ENCOUNTER — Ambulatory Visit: Payer: Medicaid Other | Admitting: Internal Medicine

## 2011-07-18 ENCOUNTER — Encounter: Payer: Self-pay | Admitting: Internal Medicine

## 2011-08-12 ENCOUNTER — Other Ambulatory Visit: Payer: Self-pay | Admitting: Internal Medicine

## 2011-08-12 NOTE — Telephone Encounter (Signed)
Pt did not keep recent appointment. I am going to deny these refills. We did not have a correct phone number on her as I recall.

## 2011-08-16 ENCOUNTER — Other Ambulatory Visit: Payer: Self-pay | Admitting: Internal Medicine

## 2011-08-17 NOTE — Telephone Encounter (Signed)
Pt missed last appt. Was supposed to have contacted [place] for appt and then we will fill for 30-60 days.We do not have a good phone contact number as I recall. You may try her mother [name] who is also a patient here. Lacye is mildly  Mentally retarded.

## 2011-08-17 NOTE — Telephone Encounter (Signed)
Pt missed last appt. Have denied refills. We did not have a valid phone for her and she is mildly mentally retarded. Her mother is [name] also a patient here. We need to contact her about missed appt and refills. Currently she is off Synthroid. Would prefer to have her back on Synthroid for several weeks. I believe a CPE was missed since OCPs are due for refill as well.

## 2011-08-18 ENCOUNTER — Other Ambulatory Visit: Payer: Self-pay

## 2011-09-02 ENCOUNTER — Ambulatory Visit (INDEPENDENT_AMBULATORY_CARE_PROVIDER_SITE_OTHER): Payer: BC Managed Care – PPO | Admitting: Internal Medicine

## 2011-09-02 ENCOUNTER — Encounter: Payer: Self-pay | Admitting: Internal Medicine

## 2011-09-02 VITALS — BP 124/80 | HR 84 | Temp 98.4°F | Ht 64.5 in | Wt 259.0 lb

## 2011-09-02 DIAGNOSIS — E039 Hypothyroidism, unspecified: Secondary | ICD-10-CM

## 2011-09-02 DIAGNOSIS — F70 Mild intellectual disabilities: Secondary | ICD-10-CM

## 2011-09-02 DIAGNOSIS — Z8639 Personal history of other endocrine, nutritional and metabolic disease: Secondary | ICD-10-CM

## 2011-09-02 DIAGNOSIS — N912 Amenorrhea, unspecified: Secondary | ICD-10-CM

## 2011-09-02 DIAGNOSIS — Z87898 Personal history of other specified conditions: Secondary | ICD-10-CM

## 2011-09-02 DIAGNOSIS — M25519 Pain in unspecified shoulder: Secondary | ICD-10-CM

## 2011-09-02 DIAGNOSIS — E669 Obesity, unspecified: Secondary | ICD-10-CM

## 2011-09-02 DIAGNOSIS — M25512 Pain in left shoulder: Secondary | ICD-10-CM

## 2011-09-02 DIAGNOSIS — Z23 Encounter for immunization: Secondary | ICD-10-CM

## 2011-09-02 NOTE — Patient Instructions (Addendum)
Take Flexeril at bedtime for shoulder pain. Exercises left shoulder several times daily. Apply ice to shoulder 20 minutes twice daily. Take prednisone as directed in tapering course. Return in 3 weeks. Restart Synthroid 0.088 mg daily. Return in 3 weeks for reevaluation.
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
## 2011-09-30 ENCOUNTER — Encounter: Payer: Self-pay | Admitting: Internal Medicine

## 2011-09-30 ENCOUNTER — Ambulatory Visit (INDEPENDENT_AMBULATORY_CARE_PROVIDER_SITE_OTHER): Payer: Medicaid Other | Admitting: Internal Medicine

## 2011-09-30 ENCOUNTER — Ambulatory Visit
Admission: RE | Admit: 2011-09-30 | Discharge: 2011-09-30 | Disposition: A | Discharge status: Home / Self Care | Payer: BC Managed Care – PPO | Source: Ambulatory Visit | Attending: Internal Medicine | Admitting: Internal Medicine

## 2011-09-30 VITALS — BP 110/80 | HR 100 | Temp 98.8°F | Ht 64.5 in | Wt 253.0 lb

## 2011-09-30 DIAGNOSIS — M542 Cervicalgia: Secondary | ICD-10-CM

## 2011-09-30 DIAGNOSIS — M25519 Pain in unspecified shoulder: Secondary | ICD-10-CM

## 2011-09-30 DIAGNOSIS — F79 Unspecified intellectual disabilities: Secondary | ICD-10-CM

## 2011-09-30 DIAGNOSIS — E039 Hypothyroidism, unspecified: Secondary | ICD-10-CM

## 2011-09-30 DIAGNOSIS — M62838 Other muscle spasm: Secondary | ICD-10-CM
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
## 2011-10-01 NOTE — Patient Instructions (Addendum)
Take Ste rapred DS 10 mg 12 day dosepak as directed. We will instruct you further as to how to proceed for evaluation of shoulder and neck pain. Scheduled for MRI of C Spine on 10/08/2011 at 11:45 at [facility]

## 2011-10-01 NOTE — Addendum Note (Signed)
Addended by: Judy Pimple on: 10/01/2011 09:37 AM   Modules accepted: Orders

## 2011-10-08 ENCOUNTER — Ambulatory Visit
Admission: RE | Admit: 2011-10-08 | Discharge: 2011-10-08 | Disposition: A | Discharge status: Home / Self Care | Payer: BC Managed Care – PPO | Source: Ambulatory Visit | Attending: Internal Medicine | Admitting: Internal Medicine

## 2011-10-08 ENCOUNTER — Telehealth: Payer: Self-pay | Admitting: Internal Medicine

## 2011-10-08 DIAGNOSIS — M542 Cervicalgia: Secondary | ICD-10-CM

## 2011-10-08 DIAGNOSIS — M25519 Pain in unspecified shoulder: Secondary | ICD-10-CM

## 2011-10-08 NOTE — Telephone Encounter (Signed)
Mistakenly opened phone encounter.

## 2011-10-09 ENCOUNTER — Telehealth: Payer: Self-pay

## 2011-10-09 MED ORDER — HYDROCODONE-ACETAMINOPHEN 5-500 MG PO TABS: 1.0000 | ORAL_TABLET | Freq: Three times a day (TID) | ORAL | Status: AC | PRN

## 2011-10-09 NOTE — Telephone Encounter (Signed)
Rx for Vicodin 5/500 1 po q 8 hr prn sent to [facility]

## 2011-10-09 NOTE — Telephone Encounter (Signed)
Requesting pain medication/ states Ibuprofen not working

## 2011-10-09 NOTE — Telephone Encounter (Signed)
Requesting pain medication/ ibuprofen not helping enough

## 2011-10-09 NOTE — Progress Notes (Signed)
Patient informed. 

## 2011-10-17 ENCOUNTER — Ambulatory Visit: Payer: Medicaid Other | Admitting: Internal Medicine

## 2011-10-30 ENCOUNTER — Ambulatory Visit: Payer: BC Managed Care – PPO | Admitting: Internal Medicine

## 2011-11-07 ENCOUNTER — Encounter: Payer: Self-pay | Admitting: Internal Medicine

## 2011-11-07 ENCOUNTER — Ambulatory Visit (INDEPENDENT_AMBULATORY_CARE_PROVIDER_SITE_OTHER): Payer: Medicaid Other | Admitting: Internal Medicine

## 2011-11-07 VITALS — BP 134/78 | Temp 98.1°F | Ht 64.5 in | Wt 252.0 lb

## 2011-11-07 DIAGNOSIS — M25519 Pain in unspecified shoulder: Secondary | ICD-10-CM

## 2011-11-07 DIAGNOSIS — M25512 Pain in left shoulder: Secondary | ICD-10-CM

## 2011-11-07 DIAGNOSIS — E039 Hypothyroidism, unspecified: Secondary | ICD-10-CM

## 2011-11-07 LAB — TSH: TSH: 1.132 u[IU]/mL (ref 0.350–4.500)

## 2011-11-07 NOTE — Patient Instructions (Addendum)
Continue same dose of Synthroid and return in 6 months. 

## 2011-11-07 NOTE — Progress Notes (Signed)
  Subjective:    [name], female    DOB: May 01, 1980, 31 y.o.   MRN: [id number]  HPI 31 year old obese white female in today to followup on hypothyroidism. Denies noncompliance with thyroid replacement. Continues to have issues with painful left shoulder and will be seeing neurosurgeon today. Recent MRI shows right C4 encroachment but symptoms are on the left. If neurosurgeon cannot find reason for shoulder issues, we will need to do MRI of her left shoulder. Not motivated to diet and exercise. Has mild mental retardation.    Review of Systems     Objective:   Physical Exam obese white female in no acute distress. No thyromegaly. Continues to complain of shoulder pain. Tender in left trapezius muscle.        Assessment & Plan:  Left shoulder pain  Right C4 encroachment  Hypothyroidism  Obesity  Mild mental retardation  Plan: Await neurosurgical opinion make further recommendations after his evaluation. TSH drawn on Synthroid 0.088 mg daily

## 2011-11-11 ENCOUNTER — Ambulatory Visit: Payer: BC Managed Care – PPO | Admitting: Internal Medicine

## 2012-03-08 ENCOUNTER — Other Ambulatory Visit: Payer: Self-pay | Admitting: Internal Medicine

## 2012-03-08 NOTE — Telephone Encounter (Signed)
Refill Synthroid- should have repeat visit booked for spring. Please check on this may be a PE.

## 2012-03-09 ENCOUNTER — Other Ambulatory Visit: Payer: Self-pay

## 2012-03-09 MED ORDER — LEVOTHYROXINE SODIUM 88 MCG PO TABS: 88.0000 ug | ORAL_TABLET | Freq: Every day | ORAL | Status: DC

## 2012-04-15 ENCOUNTER — Other Ambulatory Visit: Payer: BC Managed Care – PPO | Admitting: Internal Medicine

## 2012-04-16 ENCOUNTER — Encounter: Payer: BC Managed Care – PPO | Admitting: Internal Medicine

## 2012-04-26 ENCOUNTER — Telehealth: Payer: Self-pay | Admitting: Internal Medicine

## 2012-04-26 MED ORDER — NORETHINDRONE 0.35 MG PO TABS: ORAL_TABLET | ORAL | Status: DC
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
## 2012-06-01 ENCOUNTER — Other Ambulatory Visit: Payer: BC Managed Care – PPO | Admitting: Internal Medicine

## 2012-06-01 ENCOUNTER — Other Ambulatory Visit: Payer: Self-pay | Admitting: Internal Medicine

## 2012-06-01 DIAGNOSIS — Z0000 Encounter for general adult medical examination without abnormal findings: Secondary | ICD-10-CM

## 2012-06-01 DIAGNOSIS — E039 Hypothyroidism, unspecified: Secondary | ICD-10-CM

## 2012-06-01 LAB — COMPREHENSIVE METABOLIC PANEL
ALT: 30 U/L (ref 0–35)
CO2: 26 mEq/L (ref 19–32)
Sodium: 138 mEq/L (ref 135–145)
Total Bilirubin: 0.6 mg/dL (ref 0.3–1.2)
Total Protein: 6.3 g/dL (ref 6.0–8.3)

## 2012-06-01 LAB — CBC WITH DIFFERENTIAL/PLATELET
Eosinophils Absolute: 0.3 10*3/uL (ref 0.0–0.7)
Lymphocytes Relative: 35 % (ref 12–46)
Lymphs Abs: 3.3 10*3/uL (ref 0.7–4.0)
MCH: 27.6 pg (ref 26.0–34.0)
Neutrophils Relative %: 51 % (ref 43–77)
Platelets: 497 10*3/uL — ABNORMAL HIGH (ref 150–400)
RBC: 4.68 MIL/uL (ref 3.87–5.11)
WBC: 9.4 10*3/uL (ref 4.0–10.5)

## 2012-06-01 LAB — LIPID PANEL
Cholesterol: 180 mg/dL (ref 0–200)
LDL Cholesterol: 131 mg/dL — ABNORMAL HIGH (ref 0–99)
VLDL: 19 mg/dL (ref 0–40)

## 2012-06-02 LAB — VITAMIN D 25 HYDROXY (VIT D DEFICIENCY, FRACTURES): Vit D, 25-Hydroxy: 17 ng/mL — ABNORMAL LOW (ref 30–89)

## 2012-06-02 LAB — TSH: TSH: 2.771 u[IU]/mL (ref 0.350–4.500)

## 2012-06-03 ENCOUNTER — Encounter: Payer: Self-pay | Admitting: Internal Medicine

## 2012-06-03 ENCOUNTER — Ambulatory Visit (INDEPENDENT_AMBULATORY_CARE_PROVIDER_SITE_OTHER): Payer: Medicaid Other | Admitting: Internal Medicine

## 2012-06-03 ENCOUNTER — Other Ambulatory Visit (HOSPITAL_COMMUNITY)
Admission: RE | Admit: 2012-06-03 | Discharge: 2012-06-03 | Disposition: A | Discharge status: Home / Self Care | Payer: Medicaid Other | Source: Ambulatory Visit | Attending: Internal Medicine | Admitting: Internal Medicine

## 2012-06-03 VITALS — BP 116/90 | HR 104 | Temp 98.8°F | Ht 64.75 in | Wt 234.0 lb

## 2012-06-03 DIAGNOSIS — Z01419 Encounter for gynecological examination (general) (routine) without abnormal findings: Secondary | ICD-10-CM | POA: Insufficient documentation

## 2012-06-03 DIAGNOSIS — M25519 Pain in unspecified shoulder: Secondary | ICD-10-CM

## 2012-06-03 DIAGNOSIS — R300 Dysuria: Secondary | ICD-10-CM

## 2012-06-03 DIAGNOSIS — F329 Major depressive disorder, single episode, unspecified: Secondary | ICD-10-CM

## 2012-06-03 DIAGNOSIS — M542 Cervicalgia: Secondary | ICD-10-CM

## 2012-06-03 DIAGNOSIS — E039 Hypothyroidism, unspecified: Secondary | ICD-10-CM

## 2012-06-03 DIAGNOSIS — Z0000 Encounter for general adult medical examination without abnormal findings: Secondary | ICD-10-CM

## 2012-06-03 DIAGNOSIS — Z87891 Personal history of nicotine dependence: Secondary | ICD-10-CM

## 2012-06-03 DIAGNOSIS — E669 Obesity, unspecified: Secondary | ICD-10-CM

## 2012-06-03 DIAGNOSIS — F70 Mild intellectual disabilities: Secondary | ICD-10-CM

## 2012-06-03 DIAGNOSIS — Z8639 Personal history of other endocrine, nutritional and metabolic disease: Secondary | ICD-10-CM

## 2012-06-03 DIAGNOSIS — M25512 Pain in left shoulder: Secondary | ICD-10-CM

## 2012-06-03 DIAGNOSIS — F341 Dysthymic disorder: Secondary | ICD-10-CM

## 2012-06-03 LAB — POCT URINALYSIS DIPSTICK
Bilirubin, UA: NEGATIVE
Leukocytes, UA: NEGATIVE
Nitrite, UA: NEGATIVE
Protein, UA: NEGATIVE
Urobilinogen, UA: NEGATIVE
pH, UA: 8.5

## 2012-06-08 ENCOUNTER — Telehealth: Payer: Self-pay

## 2012-06-08 NOTE — Progress Notes (Signed)
Attempted to call patient 2 days in a row. Phone no. Is not accepting any calls.

## 2012-07-20 ENCOUNTER — Telehealth: Payer: Self-pay

## 2012-07-20 ENCOUNTER — Other Ambulatory Visit: Payer: Self-pay | Admitting: Internal Medicine

## 2012-07-20 NOTE — Telephone Encounter (Signed)
Finished pack of oral contraceptives a week ago. Still no menstrual cycle.

## 2012-07-21 NOTE — Telephone Encounter (Signed)
Please refer to GYN physician

## 2012-08-03 ENCOUNTER — Other Ambulatory Visit: Payer: Self-pay | Admitting: Internal Medicine

## 2012-08-03 DIAGNOSIS — N912 Amenorrhea, unspecified: Secondary | ICD-10-CM

## 2012-08-03 NOTE — Telephone Encounter (Signed)
Patient scheduled for an appointment with Dr. Coral Ceo on 08/09/2012 at 1:00 pm. Informed of this.

## 2012-08-03 NOTE — Telephone Encounter (Signed)
Refill once 

## 2012-08-09 ENCOUNTER — Ambulatory Visit (INDEPENDENT_AMBULATORY_CARE_PROVIDER_SITE_OTHER): Payer: Medicaid Other | Admitting: Obstetrics

## 2012-08-09 ENCOUNTER — Encounter: Payer: Self-pay | Admitting: Obstetrics

## 2012-08-09 VITALS — BP 137/86 | HR 89 | Temp 99.4°F | Ht 65.0 in | Wt 231.0 lb

## 2012-08-09 DIAGNOSIS — N926 Irregular menstruation, unspecified: Secondary | ICD-10-CM | POA: Insufficient documentation

## 2012-08-09 DIAGNOSIS — Z3041 Encounter for surveillance of contraceptive pills: Secondary | ICD-10-CM

## 2012-08-09 DIAGNOSIS — N946 Dysmenorrhea, unspecified: Secondary | ICD-10-CM

## 2012-08-09 MED ORDER — MELOXICAM 7.5 MG PO TABS: 7.5000 mg | ORAL_TABLET | Freq: Every day | ORAL | Status: DC

## 2012-08-09 NOTE — Progress Notes (Signed)
Subjective:     [name] is a 32 y.o. female here for a routine exam.  Current complaints: Patient reports she had been using POP for years- in January her cycle stopped while still using POP. Stopped the pills in April to see if cycle would start- no cycle and pills restarted. Patient did have a pap in April- normal result . Personal health questionnaire reviewed:    Gynecologic History No LMP recorded. Patient is not currently having periods (Reason: Irregular Periods). Contraception: oral progesterone-only contraceptive Last Pap: 06/2012. Results were: normal                    Obstetric History OB History   Grav Para Term Preterm Abortions TAB SAB Ect Mult Living                   The following portions of the patient's history were reviewed and updated as appropriate: allergies, current medications, past family history, past medical history, past social history, past surgical history and problem list.  Review of Systems Pertinent items are noted in HPI.    Objective:    General appearance: alert and no distress Abdomen: normal findings: soft, non-tender Pelvic: cervix normal in appearance, external genitalia normal, no adnexal masses or tenderness, no cervical motion tenderness, uterus normal size, shape, and consistency and vagina normal without discharge    Assessment:    Irregular cycles.  Dysmenorrhea.  Tobacco abuse with contraceptive hormone use.   Plan:    Education reviewed: smoking cessation and dysmenorrhea. Contraception: oral progesterone-only contraceptive. Follow up in: 2 weeks. Ultrasound ordered. Mobic Rx.

## 2012-08-10 LAB — WET PREP BY MOLECULAR PROBE
Candida species: NEGATIVE
Gardnerella vaginalis: NEGATIVE

## 2012-08-10 LAB — GC/CHLAMYDIA PROBE AMP: GC Probe RNA: NEGATIVE

## 2012-08-11 ENCOUNTER — Other Ambulatory Visit: Payer: Self-pay | Admitting: Obstetrics

## 2012-08-11 ENCOUNTER — Ambulatory Visit (INDEPENDENT_AMBULATORY_CARE_PROVIDER_SITE_OTHER): Payer: Medicaid Other

## 2012-08-11 DIAGNOSIS — N946 Dysmenorrhea, unspecified: Secondary | ICD-10-CM

## 2012-08-11 DIAGNOSIS — N926 Irregular menstruation, unspecified: Secondary | ICD-10-CM

## 2012-08-23 ENCOUNTER — Encounter: Payer: Self-pay | Admitting: Obstetrics

## 2012-08-26 ENCOUNTER — Ambulatory Visit (INDEPENDENT_AMBULATORY_CARE_PROVIDER_SITE_OTHER): Payer: Medicaid Other | Admitting: Obstetrics

## 2012-08-26 ENCOUNTER — Encounter: Payer: Self-pay | Admitting: Obstetrics

## 2012-08-26 DIAGNOSIS — N946 Dysmenorrhea, unspecified: Secondary | ICD-10-CM

## 2012-08-26 DIAGNOSIS — Z3009 Encounter for other general counseling and advice on contraception: Secondary | ICD-10-CM

## 2012-08-26 DIAGNOSIS — N912 Amenorrhea, unspecified: Secondary | ICD-10-CM

## 2012-08-26 MED ORDER — LEVONORGESTREL-ETHINYL ESTRAD 0.15-30 MG-MCG PO TABS: 1.0000 | ORAL_TABLET | Freq: Every day | ORAL | Status: DC

## 2012-08-26 MED ORDER — HYDROCODONE-ACETAMINOPHEN 10-325 MG PO TABS: 1.0000 | ORAL_TABLET | Freq: Four times a day (QID) | ORAL | Status: DC | PRN

## 2012-08-26 MED ORDER — MEDROXYPROGESTERONE ACETATE 10 MG PO TABS: 10.0000 mg | ORAL_TABLET | Freq: Every day | ORAL | Status: DC

## 2012-08-26 NOTE — Progress Notes (Signed)
.   Subjective:     [name] is a 32 y.o. female here for a follow up visit.  No current complaints.  Personal health questionnaire reviewed: yes.   Gynecologic History No LMP recorded. Patient is not currently having periods (Reason: Irregular Periods). Contraception: oral progesterone-only contraceptive   Obstetric History OB History   Grav Para Term Preterm Abortions TAB SAB Ect Mult Living   0 0               The following portions of the patient's history were reviewed and updated as appropriate: allergies, current medications, past family history, past medical history, past social history, past surgical history and problem list.  Review of Systems Pertinent items are noted in HPI.    Objective:    No exam performed today, Follow up consult only for results of U/S and management of amenorrhea..    Assessment:    Amenorrhea  Dysmenorrhea   Plan:    Education reviewed: safe sex/STD prevention and management of secondary amenorrhea. Follow up in: 3 months. Hydrocodone Rx for dysmenorrhea.   Provera Rx for secondary amenorrhea. Nordette Rx for regulation of cycles and contraception.

## 2012-09-14 ENCOUNTER — Other Ambulatory Visit: Payer: Self-pay | Admitting: Internal Medicine

## 2012-09-14 NOTE — Telephone Encounter (Signed)
Give #60 with no refill. 

## 2012-09-21 ENCOUNTER — Encounter: Payer: Self-pay | Admitting: Obstetrics & Gynecology

## 2012-10-11 ENCOUNTER — Other Ambulatory Visit: Payer: Self-pay | Admitting: Internal Medicine

## 2012-10-11 NOTE — Telephone Encounter (Signed)
Refill once 

## 2012-10-22 ENCOUNTER — Other Ambulatory Visit: Payer: Self-pay | Admitting: Obstetrics

## 2012-10-25 ENCOUNTER — Telehealth: Payer: Self-pay | Admitting: *Deleted

## 2012-10-25 NOTE — Telephone Encounter (Signed)
Patient called requesting a refill. Left message to call back. ? medication

## 2012-11-14 ENCOUNTER — Other Ambulatory Visit: Payer: Self-pay | Admitting: Internal Medicine

## 2012-11-15 ENCOUNTER — Other Ambulatory Visit: Payer: Self-pay

## 2012-11-15 MED ORDER — ALPRAZOLAM 0.5 MG PO TABS: 0.5000 mg | ORAL_TABLET | Freq: Two times a day (BID) | ORAL | Status: DC | PRN

## 2012-11-15 NOTE — Telephone Encounter (Signed)
Refill once 
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
## 2012-11-21 NOTE — Patient Instructions (Addendum)
Takes Xanax sparingly for anxiety. Continue same dose of thyroid replacement. Try to diet exercise and lose weight. Return in 6 months.

## 2012-11-25 ENCOUNTER — Encounter: Payer: Self-pay | Admitting: Obstetrics

## 2012-11-25 ENCOUNTER — Ambulatory Visit (INDEPENDENT_AMBULATORY_CARE_PROVIDER_SITE_OTHER): Payer: Medicaid Other | Admitting: Obstetrics

## 2012-11-25 DIAGNOSIS — N912 Amenorrhea, unspecified: Secondary | ICD-10-CM

## 2012-11-25 DIAGNOSIS — N926 Irregular menstruation, unspecified: Secondary | ICD-10-CM

## 2012-11-25 NOTE — Progress Notes (Addendum)
.   Subjective:     [name] is a 32 y.o. female here for a 3 month follow up exam.  Current complaints  She still has not had a cycle in 10 months..  Personal health questionnaire reviewed: yes.   Gynecologic History No LMP recorded. Patient is not currently having periods (Reason: Irregular Periods). Contraception: none Last Pap: 2013. Results were: normal Last mammogram:N/A  Obstetric History OB History  Gravida Para Term Preterm AB SAB TAB Ectopic Multiple Living  0 0                 The following portions of the patient's history were reviewed and updated as appropriate: allergies, current medications, past family history, past medical history, past social history, past surgical history and problem list.  Review of Systems Pertinent items are noted in HPI.    Objective:    No exam performed today, Consult only..    Assessment:    Amenorrhea.   Plan:    Education reviewed: Management of amenorrhea.. Follow up in: 3 weeks. Check TSH.  E-P withdrawal if TSH normal.

## 2012-11-29 ENCOUNTER — Other Ambulatory Visit: Payer: Self-pay | Admitting: Internal Medicine

## 2012-11-29 NOTE — Telephone Encounter (Signed)
Have refilled Synthroid make sure she has 6 month recheck in October please

## 2012-11-30 NOTE — Telephone Encounter (Signed)
Has appointment scheduled for early October

## 2012-12-10 ENCOUNTER — Ambulatory Visit (INDEPENDENT_AMBULATORY_CARE_PROVIDER_SITE_OTHER): Payer: Medicaid Other | Admitting: Internal Medicine

## 2012-12-10 ENCOUNTER — Encounter: Payer: Self-pay | Admitting: Internal Medicine

## 2012-12-10 VITALS — BP 106/86 | HR 76 | Temp 97.9°F | Wt 237.0 lb

## 2012-12-10 DIAGNOSIS — F70 Mild intellectual disabilities: Secondary | ICD-10-CM

## 2012-12-10 DIAGNOSIS — F411 Generalized anxiety disorder: Secondary | ICD-10-CM

## 2012-12-10 DIAGNOSIS — E039 Hypothyroidism, unspecified: Secondary | ICD-10-CM

## 2012-12-10 DIAGNOSIS — Z23 Encounter for immunization: Secondary | ICD-10-CM

## 2012-12-10 MED ORDER — ALPRAZOLAM 0.5 MG PO TABS: 0.5000 mg | ORAL_TABLET | Freq: Two times a day (BID) | ORAL | Status: DC | PRN

## 2012-12-10 NOTE — Patient Instructions (Signed)
Suggest counseling with mental health in [place]. Take Xanax no more than twice daily. TSH checked today. Flu vaccine given. Return in 6 months
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
## 2012-12-11 LAB — TSH: TSH: 1.85 u[IU]/mL (ref 0.350–4.500)

## 2012-12-16 ENCOUNTER — Ambulatory Visit (INDEPENDENT_AMBULATORY_CARE_PROVIDER_SITE_OTHER): Payer: Medicaid Other | Admitting: Obstetrics

## 2012-12-16 ENCOUNTER — Encounter: Payer: Self-pay | Admitting: Obstetrics

## 2012-12-16 VITALS — BP 129/84 | HR 94 | Temp 98.1°F | Ht 64.0 in | Wt 241.8 lb

## 2012-12-16 DIAGNOSIS — N926 Irregular menstruation, unspecified: Secondary | ICD-10-CM

## 2012-12-16 MED ORDER — NORETHINDRONE ACETATE 5 MG PO TABS: 10.0000 mg | ORAL_TABLET | Freq: Every day | ORAL | Status: DC

## 2012-12-16 NOTE — Progress Notes (Signed)
.   Subjective:     [name] is a 32 y.o. female here for a routine exam.  Current complaints:Patient is here today for her follow up with her irregular cycles,.  Personal health questionnaire reviewed: yes.   Gynecologic History Patient's last menstrual period was 03/03/2012. Contraception: none Last Pap: 2014. Results were: normal Last mammogram: N/A  Obstetric History OB History  Gravida Para Term Preterm AB SAB TAB Ectopic Multiple Living  0 0                 The following portions of the patient's history were reviewed and updated as appropriate: allergies, current medications, past family history, past medical history, past social history, past surgical history and problem list.  Review of Systems Pertinent items are noted in HPI.    Objective:    No exam performed today, follow up for lab results.   Assessment:    Amenorrhea.  TSH WNL's.   Plan:    Education reviewed: treatment of irregular cycles.. Follow up in: 4 months. Aygestin for 10 days monthly x 3 months.

## 2013-01-12 ENCOUNTER — Ambulatory Visit: Payer: Medicaid Other | Admitting: Internal Medicine

## 2013-01-21 ENCOUNTER — Other Ambulatory Visit: Payer: Self-pay | Admitting: Obstetrics

## 2013-01-24 ENCOUNTER — Ambulatory Visit (INDEPENDENT_AMBULATORY_CARE_PROVIDER_SITE_OTHER): Payer: Medicaid Other | Admitting: Internal Medicine

## 2013-01-24 ENCOUNTER — Encounter: Payer: Self-pay | Admitting: Internal Medicine

## 2013-01-24 VITALS — BP 130/98 | HR 100 | Temp 98.9°F | Ht 64.75 in | Wt 233.0 lb

## 2013-01-24 DIAGNOSIS — J029 Acute pharyngitis, unspecified: Secondary | ICD-10-CM

## 2013-01-24 DIAGNOSIS — H6690 Otitis media, unspecified, unspecified ear: Secondary | ICD-10-CM

## 2013-01-24 DIAGNOSIS — H6693 Otitis media, unspecified, bilateral: Secondary | ICD-10-CM

## 2013-01-24 DIAGNOSIS — J209 Acute bronchitis, unspecified: Secondary | ICD-10-CM

## 2013-01-24 MED ORDER — FLUCONAZOLE 150 MG PO TABS: 150.0000 mg | ORAL_TABLET | Freq: Once | ORAL | Status: DC

## 2013-01-24 MED ORDER — AMOXICILLIN-POT CLAVULANATE 500-125 MG PO TABS: 1.0000 | ORAL_TABLET | Freq: Three times a day (TID) | ORAL | Status: DC

## 2013-01-24 NOTE — Progress Notes (Signed)
  Subjective:    [name], female    DOB: Dec 18, 1980, 32 y.o.   MRN: [id number]  HPI URI symptoms for 2 weeks. Smokes occasionally. Has had green sputum production. Has had sore throat, headache and runny nose. Sounds nasally congested. Has also developed a coated tongue posterior tongue during that time she has been ill. No documented fever or shaking chills. She took her mother to the emergency department at 2 AM today because of chest pain. She is still there being evaluated. Patient has history of anxiety, hypothyroidism, mental retardation.    Review of Systems     Objective:   Physical Exam Pharynx is red. TMs are full bilaterally but not pain. Neck is supple without significant adenopathy. She sounds hoarse when she speaks. Chest is clear. Tongue is coated posteriorly.       Assessment & Plan:  Bilateral serous otitis media  Pharyngitis- non strep  Bronchitis  History of smoking  Hypothyroidism  Mild mental retardation  Anxiety  Glossitis  Plan: Augmentin 500 mg 3 times daily for 10 days. Diflucan 150 mg 1 time dose.Call if tongue remains coated. 25 minutes spent with patient

## 2013-01-24 NOTE — Patient Instructions (Signed)
Take Augmentin 500 mg 3 times daily for 10 days. Take Diflucan 150 mg tablet one time dose. Call if not better in 7-10 days or sooner if worse

## 2013-02-11 ENCOUNTER — Other Ambulatory Visit: Payer: Self-pay | Admitting: Internal Medicine

## 2013-02-11 NOTE — Telephone Encounter (Signed)
Refill x 3 months 

## 2013-04-11 ENCOUNTER — Other Ambulatory Visit: Payer: Self-pay | Admitting: Internal Medicine

## 2013-04-18 ENCOUNTER — Ambulatory Visit (INDEPENDENT_AMBULATORY_CARE_PROVIDER_SITE_OTHER): Payer: Medicaid Other | Admitting: Obstetrics

## 2013-04-18 ENCOUNTER — Encounter: Payer: Self-pay | Admitting: Obstetrics

## 2013-04-18 VITALS — BP 127/84 | HR 91 | Temp 97.6°F | Wt 227.0 lb

## 2013-04-18 DIAGNOSIS — N946 Dysmenorrhea, unspecified: Secondary | ICD-10-CM

## 2013-04-18 MED ORDER — HYDROCODONE-IBUPROFEN 7.5-200 MG PO TABS: 1.0000 | ORAL_TABLET | Freq: Four times a day (QID) | ORAL | Status: DC | PRN

## 2013-04-18 MED ORDER — IBUPROFEN 600 MG PO TABS: 600.0000 mg | ORAL_TABLET | Freq: Four times a day (QID) | ORAL | Status: DC | PRN

## 2013-04-18 NOTE — Progress Notes (Signed)
Subjective:     [name] is a 33 y.o. female here for a follow up exam.  Current complaints: pt states that her cycles have only lasted approx 2 days with use of Aygestin.  Pt states that she is in a lot of pain with cycles.  Pt states that she has had a cycle at the beginning and end of the month.  Pt states that her pain rates 8 out of 10.  Pt mother states that she thinks that pt endometriosis may be back and causing the pain. Personal health questionnaire reviewed: yes.   Gynecologic History Patient's last menstrual period was 04/07/2013. Contraception: none Last Pap: 06/2012. Results were: normal Last mammogram: n/a  Obstetric History OB History  Gravida Para Term Preterm AB SAB TAB Ectopic Multiple Living  0 0                 The following portions of the patient's history were reviewed and updated as appropriate: allergies, current medications, past family history, past medical history, past social history, past surgical history and problem list.  Review of Systems Pertinent items are noted in HPI.    Objective:    General appearance: alert and no distress Abdomen: normal findings: soft, non-tender Pelvic: cervix normal in appearance, external genitalia normal, no adnexal masses or tenderness, no cervical motion tenderness, rectovaginal septum normal, uterus normal size, shape, and consistency and vagina normal without discharge    Assessment:    Healthy female exam.   Dysmenorrhea   Plan:    Education reviewed: low fat, low cholesterol diet, safe sex/STD prevention and weight bearing exercise. Contraception: none. Patient to F/U in the [facility].   Vicoprofen and Ibuprofen Rx.

## 2013-06-02 ENCOUNTER — Other Ambulatory Visit: Payer: Medicaid Other | Admitting: Internal Medicine

## 2013-06-06 ENCOUNTER — Other Ambulatory Visit: Payer: Medicaid Other | Admitting: Internal Medicine

## 2013-06-06 ENCOUNTER — Encounter: Payer: Self-pay | Admitting: Internal Medicine

## 2013-06-06 ENCOUNTER — Ambulatory Visit (INDEPENDENT_AMBULATORY_CARE_PROVIDER_SITE_OTHER): Payer: Medicaid Other | Admitting: Internal Medicine

## 2013-06-06 VITALS — BP 124/86 | HR 88 | Ht 65.0 in | Wt 224.0 lb

## 2013-06-06 DIAGNOSIS — Z87891 Personal history of nicotine dependence: Secondary | ICD-10-CM | POA: Diagnosis not present

## 2013-06-06 DIAGNOSIS — Z130 Encounter for screening for diseases of the blood and blood-forming organs and certain disorders involving the immune mechanism: Secondary | ICD-10-CM

## 2013-06-06 DIAGNOSIS — F411 Generalized anxiety disorder: Secondary | ICD-10-CM | POA: Diagnosis not present

## 2013-06-06 DIAGNOSIS — E039 Hypothyroidism, unspecified: Secondary | ICD-10-CM | POA: Diagnosis not present

## 2013-06-06 DIAGNOSIS — F70 Mild intellectual disabilities: Secondary | ICD-10-CM | POA: Diagnosis not present

## 2013-06-06 DIAGNOSIS — IMO0001 Reserved for inherently not codable concepts without codable children: Secondary | ICD-10-CM

## 2013-06-06 DIAGNOSIS — Z0000 Encounter for general adult medical examination without abnormal findings: Secondary | ICD-10-CM

## 2013-06-06 DIAGNOSIS — F418 Other specified anxiety disorders: Secondary | ICD-10-CM | POA: Insufficient documentation

## 2013-06-06 DIAGNOSIS — E559 Vitamin D deficiency, unspecified: Secondary | ICD-10-CM

## 2013-06-06 DIAGNOSIS — Z8659 Personal history of other mental and behavioral disorders: Secondary | ICD-10-CM

## 2013-06-06 DIAGNOSIS — M7918 Myalgia, other site: Secondary | ICD-10-CM

## 2013-06-06 DIAGNOSIS — Z13220 Encounter for screening for lipoid disorders: Secondary | ICD-10-CM

## 2013-06-06 DIAGNOSIS — F439 Reaction to severe stress, unspecified: Secondary | ICD-10-CM

## 2013-06-06 DIAGNOSIS — N946 Dysmenorrhea, unspecified: Secondary | ICD-10-CM

## 2013-06-06 DIAGNOSIS — Z733 Stress, not elsewhere classified: Secondary | ICD-10-CM | POA: Diagnosis not present

## 2013-06-06 DIAGNOSIS — E669 Obesity, unspecified: Secondary | ICD-10-CM | POA: Diagnosis not present

## 2013-06-06 LAB — CBC WITH DIFFERENTIAL/PLATELET
Basophils Absolute: 0 10*3/uL (ref 0.0–0.1)
Basophils Relative: 0 % (ref 0–1)
EOS ABS: 0.4 10*3/uL (ref 0.0–0.7)
EOS PCT: 3 % (ref 0–5)
HCT: 41.5 % (ref 36.0–46.0)
HEMOGLOBIN: 14.4 g/dL (ref 12.0–15.0)
LYMPHS ABS: 4.4 10*3/uL — AB (ref 0.7–4.0)
Lymphocytes Relative: 30 % (ref 12–46)
MCH: 29.7 pg (ref 26.0–34.0)
MCHC: 34.7 g/dL (ref 30.0–36.0)
MCV: 85.6 fL (ref 78.0–100.0)
MONOS PCT: 6 % (ref 3–12)
Monocytes Absolute: 0.9 10*3/uL (ref 0.1–1.0)
Neutro Abs: 8.8 10*3/uL — ABNORMAL HIGH (ref 1.7–7.7)
Neutrophils Relative %: 61 % (ref 43–77)
Platelets: 423 10*3/uL — ABNORMAL HIGH (ref 150–400)
RBC: 4.85 MIL/uL (ref 3.87–5.11)
RDW: 14.4 % (ref 11.5–15.5)
WBC: 14.5 10*3/uL — ABNORMAL HIGH (ref 4.0–10.5)

## 2013-06-06 LAB — POCT URINALYSIS DIPSTICK
BILIRUBIN UA: NEGATIVE
Blood, UA: NEGATIVE
Glucose, UA: NEGATIVE
Ketones, UA: NEGATIVE
LEUKOCYTES UA: NEGATIVE
Nitrite, UA: NEGATIVE
PH UA: 6.5
Protein, UA: NEGATIVE
Spec Grav, UA: 1.01
Urobilinogen, UA: NEGATIVE

## 2013-06-06 LAB — COMPREHENSIVE METABOLIC PANEL
ALT: <8 U/L (ref 0–35)
AST: 9 U/L (ref 0–37)
Albumin: 3.8 g/dL (ref 3.5–5.2)
Alkaline Phosphatase: 60 U/L (ref 39–117)
BILIRUBIN TOTAL: 0.7 mg/dL (ref 0.2–1.2)
BUN: 6 mg/dL (ref 6–23)
CO2: 29 meq/L (ref 19–32)
CREATININE: 0.6 mg/dL (ref 0.50–1.10)
Calcium: 9 mg/dL (ref 8.4–10.5)
Chloride: 100 mEq/L (ref 96–112)
GLUCOSE: 90 mg/dL (ref 70–99)
Potassium: 3.9 mEq/L (ref 3.5–5.3)
Sodium: 139 mEq/L (ref 135–145)
TOTAL PROTEIN: 6.3 g/dL (ref 6.0–8.3)

## 2013-06-06 LAB — LIPID PANEL
CHOL/HDL RATIO: 5.7 ratio
CHOLESTEROL: 166 mg/dL (ref 0–200)
HDL: 29 mg/dL — ABNORMAL LOW (ref >39)
LDL Cholesterol: 123 mg/dL — ABNORMAL HIGH (ref 0–99)
Triglycerides: 71 mg/dL (ref <150)
VLDL: 14 mg/dL (ref 0–40)

## 2013-06-06 LAB — TSH: TSH: 4.108 u[IU]/mL (ref 0.350–4.500)

## 2013-06-06 MED ORDER — ALPRAZOLAM 0.5 MG PO TABS: ORAL_TABLET | ORAL | Status: DC
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
## 2013-06-06 NOTE — Patient Instructions (Addendum)
Continue same meds and return in 6 months 

## 2013-06-07 ENCOUNTER — Other Ambulatory Visit: Payer: Medicaid Other | Admitting: Internal Medicine

## 2013-06-07 LAB — VITAMIN D 25 HYDROXY (VIT D DEFICIENCY, FRACTURES): Vit D, 25-Hydroxy: 35 ng/mL (ref 30–89)

## 2013-06-07 MED ORDER — LEVOTHYROXINE SODIUM 88 MCG PO TABS: 88.0000 ug | ORAL_TABLET | Freq: Every day | ORAL | Status: DC

## 2013-06-07 NOTE — Addendum Note (Signed)
Addended by: Brett Canales on: 06/07/2013 02:31 PM   Modules accepted: Orders

## 2013-06-09 ENCOUNTER — Encounter: Payer: Medicaid Other | Admitting: Internal Medicine

## 2013-06-14 ENCOUNTER — Other Ambulatory Visit: Payer: Medicaid Other | Admitting: Internal Medicine

## 2013-06-14 DIAGNOSIS — D72829 Elevated white blood cell count, unspecified: Secondary | ICD-10-CM

## 2013-06-14 LAB — CBC WITH DIFFERENTIAL/PLATELET
BASOS ABS: 0 10*3/uL (ref 0.0–0.1)
Basophils Relative: 0 % (ref 0–1)
Eosinophils Absolute: 0.5 10*3/uL (ref 0.0–0.7)
Eosinophils Relative: 4 % (ref 0–5)
HCT: 39.6 % (ref 36.0–46.0)
Hemoglobin: 13.4 g/dL (ref 12.0–15.0)
Lymphocytes Relative: 39 % (ref 12–46)
Lymphs Abs: 5 10*3/uL — ABNORMAL HIGH (ref 0.7–4.0)
MCH: 28.9 pg (ref 26.0–34.0)
MCHC: 33.8 g/dL (ref 30.0–36.0)
MCV: 85.3 fL (ref 78.0–100.0)
MONO ABS: 0.8 10*3/uL (ref 0.1–1.0)
Monocytes Relative: 6 % (ref 3–12)
NEUTROS ABS: 6.5 10*3/uL (ref 1.7–7.7)
Neutrophils Relative %: 51 % (ref 43–77)
PLATELETS: 499 10*3/uL — AB (ref 150–400)
RBC: 4.64 MIL/uL (ref 3.87–5.11)
RDW: 14.7 % (ref 11.5–15.5)
WBC: 12.7 10*3/uL — AB (ref 4.0–10.5)

## 2013-07-19 ENCOUNTER — Ambulatory Visit: Payer: Medicaid Other | Admitting: Internal Medicine

## 2013-08-16 ENCOUNTER — Telehealth: Payer: Self-pay | Admitting: *Deleted

## 2013-08-16 NOTE — Telephone Encounter (Signed)
Patient is calling to see if Dr Jodi Mourning has made a referral/ appointment  For her at the GYN clinic across the street.

## 2013-10-06 ENCOUNTER — Other Ambulatory Visit: Payer: Self-pay | Admitting: Internal Medicine

## 2013-10-06 NOTE — Telephone Encounter (Signed)
Refill x 90 days 

## 2013-12-08 ENCOUNTER — Ambulatory Visit
Admission: RE | Admit: 2013-12-08 | Discharge: 2013-12-08 | Disposition: A | Discharge status: Home / Self Care | Payer: Medicaid Other | Source: Ambulatory Visit | Attending: Internal Medicine | Admitting: Internal Medicine

## 2013-12-08 ENCOUNTER — Ambulatory Visit (INDEPENDENT_AMBULATORY_CARE_PROVIDER_SITE_OTHER): Payer: Medicaid Other | Admitting: Internal Medicine

## 2013-12-08 ENCOUNTER — Encounter: Payer: Self-pay | Admitting: Internal Medicine

## 2013-12-08 VITALS — BP 118/78 | HR 92 | Temp 98.1°F | Ht 65.0 in | Wt 219.0 lb

## 2013-12-08 DIAGNOSIS — M5442 Lumbago with sciatica, left side: Secondary | ICD-10-CM

## 2013-12-08 DIAGNOSIS — Z8659 Personal history of other mental and behavioral disorders: Secondary | ICD-10-CM | POA: Diagnosis not present

## 2013-12-08 DIAGNOSIS — N946 Dysmenorrhea, unspecified: Secondary | ICD-10-CM | POA: Diagnosis not present

## 2013-12-08 DIAGNOSIS — Z23 Encounter for immunization: Secondary | ICD-10-CM

## 2013-12-08 DIAGNOSIS — E039 Hypothyroidism, unspecified: Secondary | ICD-10-CM

## 2013-12-08 DIAGNOSIS — F411 Generalized anxiety disorder: Secondary | ICD-10-CM | POA: Diagnosis not present

## 2013-12-08 MED ORDER — ALPRAZOLAM 0.5 MG PO TABS: ORAL_TABLET | ORAL | Status: DC

## 2013-12-08 MED ORDER — HYDROCODONE-ACETAMINOPHEN 10-325 MG PO TABS: 1.0000 | ORAL_TABLET | Freq: Four times a day (QID) | ORAL | Status: DC | PRN

## 2013-12-08 MED ORDER — PREDNISONE 10 MG PO TABS: 10.0000 mg | ORAL_TABLET | Freq: Every day | ORAL | Status: DC

## 2013-12-08 MED ORDER — CYCLOBENZAPRINE HCL 10 MG PO TABS: 10.0000 mg | ORAL_TABLET | Freq: Every day | ORAL | Status: DC

## 2013-12-08 NOTE — Patient Instructions (Signed)
Take Flexeril at bedtime. Take hydrocodone/APAP sparingly for back pain. Take prednisone dosepak hysterectomy. If not better in 2 weeks, we will send you to physical therapy. Have LS-spine films today.
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
## 2013-12-09 NOTE — Addendum Note (Signed)
Addended by: Amado Coe on: 12/09/2013 12:47 PM   Modules accepted: Orders

## 2014-01-06 ENCOUNTER — Ambulatory Visit (INDEPENDENT_AMBULATORY_CARE_PROVIDER_SITE_OTHER): Payer: Medicaid Other | Admitting: Internal Medicine

## 2014-01-06 ENCOUNTER — Encounter: Payer: Self-pay | Admitting: Internal Medicine

## 2014-01-06 VITALS — BP 122/80 | HR 99 | Temp 97.3°F | Wt 216.0 lb

## 2014-01-06 DIAGNOSIS — N946 Dysmenorrhea, unspecified: Secondary | ICD-10-CM

## 2014-01-06 DIAGNOSIS — J069 Acute upper respiratory infection, unspecified: Secondary | ICD-10-CM

## 2014-01-06 DIAGNOSIS — M549 Dorsalgia, unspecified: Secondary | ICD-10-CM | POA: Diagnosis not present

## 2014-01-06 LAB — POCT URINALYSIS DIPSTICK
Bilirubin, UA: NEGATIVE
GLUCOSE UA: NEGATIVE
KETONES UA: NEGATIVE
Leukocytes, UA: NEGATIVE
Nitrite, UA: NEGATIVE
Protein, UA: NEGATIVE
Spec Grav, UA: 1.01
UROBILINOGEN UA: NEGATIVE
pH, UA: 6

## 2014-01-06 MED ORDER — PREDNISONE 10 MG PO TABS: 10.0000 mg | ORAL_TABLET | Freq: Every day | ORAL | Status: DC

## 2014-01-06 MED ORDER — LEVOFLOXACIN 500 MG PO TABS: 500.0000 mg | ORAL_TABLET | Freq: Every day | ORAL | Status: DC

## 2014-01-06 MED ORDER — HYDROCODONE-ACETAMINOPHEN 10-325 MG PO TABS: ORAL_TABLET | ORAL | Status: DC

## 2014-01-06 MED ORDER — CYCLOBENZAPRINE HCL 10 MG PO TABS: 10.0000 mg | ORAL_TABLET | Freq: Every day | ORAL | Status: DC

## 2014-01-06 NOTE — Patient Instructions (Signed)
Take Prednisone in tapering fashion as directed. Take Levaquin daily with a meal. Take Flexeril at bedtime. Take pain meds twice a day.

## 2014-01-06 NOTE — Progress Notes (Signed)
   Subjective:    [name], female    DOB: [birth year], 33 y.o.   MRN: [id number]  HPI  33 year old overweight Female with history of hypothyroidism in today with acute back pain. Pain is in low back radiating into her left buttock. Issues with dysmenorrhea. History of anxiety and hypothyroidism. Also has acute URI symptoms. History of mental retardation. Situational stress.    Review of Systems     Objective:   Physical Exam Pharynx is slightly injected. TMs are clear. Neck supple. Chest clear.  Straight leg raising is negative at 90 bilaterally. Muscle strength in lower extremities is normal. Deep tendon reflexes 1+ and symmetrical in the knees.      Assessment & Plan:  Low back pain  History of dysmenorrhea  Anxiety  Hypothyroidism  Acute URI  Plan: Sterapred DS 10 mg 6 day dosepak to take in tapering course as directed. Norco 10/325 one by mouth every 12 hours when necessary pain. Levaquin 500 milligrams daily for 7 days. Flexeril 10 mg at bedtime.

## 2014-02-01 ENCOUNTER — Emergency Department (HOSPITAL_COMMUNITY)
Admission: EM | Admit: 2014-02-01 | Discharge: 2014-02-01 | Disposition: A | Discharge status: Home / Self Care | Payer: No Typology Code available for payment source | Attending: Emergency Medicine | Admitting: Emergency Medicine

## 2014-02-01 ENCOUNTER — Emergency Department (HOSPITAL_COMMUNITY): Payer: No Typology Code available for payment source

## 2014-02-01 ENCOUNTER — Encounter (HOSPITAL_COMMUNITY): Payer: Self-pay | Admitting: Emergency Medicine

## 2014-02-01 DIAGNOSIS — F329 Major depressive disorder, single episode, unspecified: Secondary | ICD-10-CM | POA: Insufficient documentation

## 2014-02-01 DIAGNOSIS — Z8742 Personal history of other diseases of the female genital tract: Secondary | ICD-10-CM | POA: Insufficient documentation

## 2014-02-01 DIAGNOSIS — Z720 Tobacco use: Secondary | ICD-10-CM | POA: Insufficient documentation

## 2014-02-01 DIAGNOSIS — S4992XA Unspecified injury of left shoulder and upper arm, initial encounter: Secondary | ICD-10-CM | POA: Diagnosis not present

## 2014-02-01 DIAGNOSIS — R0602 Shortness of breath: Secondary | ICD-10-CM | POA: Diagnosis not present

## 2014-02-01 DIAGNOSIS — Z79899 Other long term (current) drug therapy: Secondary | ICD-10-CM | POA: Diagnosis not present

## 2014-02-01 DIAGNOSIS — S199XXA Unspecified injury of neck, initial encounter: Secondary | ICD-10-CM | POA: Diagnosis present

## 2014-02-01 DIAGNOSIS — E039 Hypothyroidism, unspecified: Secondary | ICD-10-CM | POA: Diagnosis not present

## 2014-02-01 DIAGNOSIS — Y998 Other external cause status: Secondary | ICD-10-CM | POA: Insufficient documentation

## 2014-02-01 DIAGNOSIS — S3992XA Unspecified injury of lower back, initial encounter: Secondary | ICD-10-CM | POA: Diagnosis not present

## 2014-02-01 DIAGNOSIS — Y9389 Activity, other specified: Secondary | ICD-10-CM | POA: Diagnosis not present

## 2014-02-01 DIAGNOSIS — Y92410 Unspecified street and highway as the place of occurrence of the external cause: Secondary | ICD-10-CM | POA: Diagnosis not present

## 2014-02-01 DIAGNOSIS — Z792 Long term (current) use of antibiotics: Secondary | ICD-10-CM | POA: Diagnosis not present

## 2014-02-01 MED ORDER — OXYCODONE-ACETAMINOPHEN 5-325 MG PO TABS
1.0000 | ORAL_TABLET | Freq: Once | ORAL | Status: AC
  Administered 2014-02-01: 1 via ORAL
  Filled 2014-02-01: qty 1

## 2014-02-01 MED ORDER — CYCLOBENZAPRINE HCL 10 MG PO TABS: 10.0000 mg | ORAL_TABLET | Freq: Three times a day (TID) | ORAL | Status: DC | PRN

## 2014-02-01 MED ORDER — IBUPROFEN 800 MG PO TABS: 800.0000 mg | ORAL_TABLET | Freq: Three times a day (TID) | ORAL | Status: DC
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
## 2014-02-01 NOTE — Discharge Instructions (Signed)
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
## 2014-02-01 NOTE — Progress Notes (Signed)
Orthopedic Tech Progress Note Patient Details:  [name] 1980/08/01 [id number] Applied soft cervical collar. Ortho Devices Type of Ortho Device: Soft collar Ortho Device/Splint Interventions: Application   Darrol Poke 02/01/2014, 7:08 PM

## 2014-02-01 NOTE — ED Notes (Signed)
Paged ortho for soft collar.

## 2014-02-07 ENCOUNTER — Other Ambulatory Visit: Payer: Self-pay | Admitting: Internal Medicine

## 2014-04-08 ENCOUNTER — Encounter: Payer: Self-pay | Admitting: Internal Medicine

## 2014-05-31 ENCOUNTER — Other Ambulatory Visit: Payer: Self-pay | Admitting: Internal Medicine

## 2014-05-31 NOTE — Telephone Encounter (Signed)
Refill x one month

## 2014-06-01 ENCOUNTER — Other Ambulatory Visit: Payer: Self-pay | Admitting: *Deleted

## 2014-06-01 MED ORDER — ALPRAZOLAM 0.5 MG PO TABS: ORAL_TABLET | ORAL | Status: DC

## 2014-06-01 NOTE — Telephone Encounter (Signed)
Refill on xanax sent to patient pharmacy

## 2014-07-18 ENCOUNTER — Ambulatory Visit (INDEPENDENT_AMBULATORY_CARE_PROVIDER_SITE_OTHER): Payer: Medicaid Other | Admitting: Internal Medicine

## 2014-07-18 ENCOUNTER — Encounter: Payer: Self-pay | Admitting: Internal Medicine

## 2014-07-18 VITALS — BP 124/84 | HR 71 | Temp 97.6°F | Ht 65.0 in | Wt 213.0 lb

## 2014-07-18 DIAGNOSIS — R0789 Other chest pain: Secondary | ICD-10-CM

## 2014-07-18 DIAGNOSIS — H6503 Acute serous otitis media, bilateral: Secondary | ICD-10-CM

## 2014-07-18 DIAGNOSIS — Z639 Problem related to primary support group, unspecified: Secondary | ICD-10-CM

## 2014-07-18 DIAGNOSIS — F411 Generalized anxiety disorder: Secondary | ICD-10-CM | POA: Diagnosis not present

## 2014-07-18 DIAGNOSIS — E039 Hypothyroidism, unspecified: Secondary | ICD-10-CM | POA: Diagnosis not present

## 2014-07-18 LAB — TSH: TSH: 4.146 u[IU]/mL (ref 0.350–4.500)

## 2014-07-18 MED ORDER — ALPRAZOLAM 0.5 MG PO TABS: ORAL_TABLET | ORAL | Status: DC

## 2014-07-18 MED ORDER — AZITHROMYCIN 250 MG PO TABS: ORAL_TABLET | ORAL | Status: DC

## 2014-07-18 NOTE — Patient Instructions (Signed)
Take Zithromax Z-PAK as directed. Takes Xanax for anxiety. Take Advil for musculoskeletal pain. But physical exam in the next 6 months.
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
## 2014-10-20 ENCOUNTER — Other Ambulatory Visit: Payer: Medicaid Other | Admitting: Internal Medicine

## 2014-10-21 ENCOUNTER — Other Ambulatory Visit: Payer: Self-pay | Admitting: Internal Medicine

## 2014-10-23 ENCOUNTER — Other Ambulatory Visit: Payer: Medicaid Other | Admitting: Internal Medicine

## 2014-10-23 DIAGNOSIS — Z0000 Encounter for general adult medical examination without abnormal findings: Secondary | ICD-10-CM

## 2014-10-23 DIAGNOSIS — Z1329 Encounter for screening for other suspected endocrine disorder: Secondary | ICD-10-CM

## 2014-10-23 DIAGNOSIS — Z13220 Encounter for screening for lipoid disorders: Secondary | ICD-10-CM

## 2014-10-23 DIAGNOSIS — Z130 Encounter for screening for diseases of the blood and blood-forming organs and certain disorders involving the immune mechanism: Secondary | ICD-10-CM

## 2014-10-23 DIAGNOSIS — Z1321 Encounter for screening for nutritional disorder: Secondary | ICD-10-CM

## 2014-10-23 LAB — CBC WITH DIFFERENTIAL/PLATELET
Basophils Absolute: 0 10*3/uL (ref 0.0–0.1)
Basophils Relative: 0 % (ref 0–1)
Eosinophils Absolute: 0.4 10*3/uL (ref 0.0–0.7)
Eosinophils Relative: 3 % (ref 0–5)
HCT: 42.3 % (ref 36.0–46.0)
HEMOGLOBIN: 14 g/dL (ref 12.0–15.0)
Lymphocytes Relative: 30 % (ref 12–46)
Lymphs Abs: 3.9 10*3/uL (ref 0.7–4.0)
MCH: 28.8 pg (ref 26.0–34.0)
MCHC: 33.1 g/dL (ref 30.0–36.0)
MCV: 87 fL (ref 78.0–100.0)
MPV: 9.2 fL (ref 8.6–12.4)
Monocytes Absolute: 0.9 10*3/uL (ref 0.1–1.0)
Monocytes Relative: 7 % (ref 3–12)
NEUTROS ABS: 7.7 10*3/uL (ref 1.7–7.7)
NEUTROS PCT: 60 % (ref 43–77)
PLATELETS: 504 10*3/uL — AB (ref 150–400)
RBC: 4.86 MIL/uL (ref 3.87–5.11)
RDW: 13.4 % (ref 11.5–15.5)
WBC: 12.9 10*3/uL — ABNORMAL HIGH (ref 4.0–10.5)

## 2014-10-23 LAB — COMPLETE METABOLIC PANEL WITH GFR
ALBUMIN: 3.7 g/dL (ref 3.6–5.1)
ALK PHOS: 61 U/L (ref 33–115)
ALT: 10 U/L (ref 6–29)
AST: 12 U/L (ref 10–30)
BUN: 4 mg/dL — AB (ref 7–25)
CO2: 27 mmol/L (ref 20–31)
Calcium: 9 mg/dL (ref 8.6–10.2)
Chloride: 103 mmol/L (ref 98–110)
Creat: 0.66 mg/dL (ref 0.50–1.10)
GFR, Est African American: >89 mL/min (ref >=60)
GFR, Est Non African American: >89 mL/min (ref >=60)
GLUCOSE: 107 mg/dL — AB (ref 65–99)
POTASSIUM: 4.4 mmol/L (ref 3.5–5.3)
SODIUM: 141 mmol/L (ref 135–146)
Total Bilirubin: 0.4 mg/dL (ref 0.2–1.2)
Total Protein: 6.4 g/dL (ref 6.1–8.1)

## 2014-10-23 LAB — LIPID PANEL
CHOLESTEROL: 237 mg/dL — AB (ref 125–200)
HDL: 31 mg/dL — ABNORMAL LOW (ref >=46)
LDL Cholesterol: 174 mg/dL — ABNORMAL HIGH (ref <130)
Total CHOL/HDL Ratio: 7.6 Ratio — ABNORMAL HIGH (ref <=5.0)
Triglycerides: 162 mg/dL — ABNORMAL HIGH (ref <150)
VLDL: 32 mg/dL — ABNORMAL HIGH (ref <30)

## 2014-10-23 LAB — TSH: TSH: 1.534 u[IU]/mL (ref 0.350–4.500)

## 2014-10-24 ENCOUNTER — Encounter: Payer: Medicaid Other | Admitting: Internal Medicine

## 2014-10-24 LAB — VITAMIN D 25 HYDROXY (VIT D DEFICIENCY, FRACTURES): Vit D, 25-Hydroxy: 26 ng/mL — ABNORMAL LOW (ref 30–100)

## 2014-11-13 ENCOUNTER — Encounter (HOSPITAL_COMMUNITY): Payer: Self-pay | Admitting: Emergency Medicine

## 2014-11-13 ENCOUNTER — Emergency Department (HOSPITAL_COMMUNITY)
Admission: EM | Admit: 2014-11-13 | Discharge: 2014-11-13 | Disposition: A | Discharge status: Home / Self Care | Payer: Medicaid Other | Attending: Emergency Medicine | Admitting: Emergency Medicine

## 2014-11-13 DIAGNOSIS — Z720 Tobacco use: Secondary | ICD-10-CM | POA: Insufficient documentation

## 2014-11-13 DIAGNOSIS — K002 Abnormalities of size and form of teeth: Secondary | ICD-10-CM | POA: Diagnosis not present

## 2014-11-13 DIAGNOSIS — E039 Hypothyroidism, unspecified: Secondary | ICD-10-CM | POA: Diagnosis not present

## 2014-11-13 DIAGNOSIS — Z8742 Personal history of other diseases of the female genital tract: Secondary | ICD-10-CM | POA: Insufficient documentation

## 2014-11-13 DIAGNOSIS — K029 Dental caries, unspecified: Secondary | ICD-10-CM | POA: Diagnosis not present

## 2014-11-13 DIAGNOSIS — Z79899 Other long term (current) drug therapy: Secondary | ICD-10-CM | POA: Diagnosis not present

## 2014-11-13 DIAGNOSIS — K088 Other specified disorders of teeth and supporting structures: Secondary | ICD-10-CM | POA: Diagnosis present

## 2014-11-13 DIAGNOSIS — F329 Major depressive disorder, single episode, unspecified: Secondary | ICD-10-CM | POA: Insufficient documentation

## 2014-11-13 MED ORDER — IBUPROFEN 800 MG PO TABS: 800.0000 mg | ORAL_TABLET | Freq: Three times a day (TID) | ORAL | Status: DC

## 2014-11-13 MED ORDER — PENICILLIN V POTASSIUM 500 MG PO TABS: 500.0000 mg | ORAL_TABLET | Freq: Three times a day (TID) | ORAL | Status: DC

## 2014-11-13 NOTE — ED Provider Notes (Signed)
CSN: [id number]     Arrival date & time 11/13/14  1942 History  This chart was scribed for non-physician practitioner, Abigail Butts, PA-C, working with Gareth Morgan, MD, by Helane Gunther ED Scribe. This patient was seen in room TR08C/TR08C and the patient's care was started at 8:53 PM     Chief Complaint  Patient presents with  . Dental Pain   The history is provided by the patient and medical records. No language interpreter was used.   HPI Comments: [name] is a 34 y.o. female who presents to the Emergency Department complaining of constant, aching, worsening dental pain onset 3 weeks ago. She notes 3 teeth in particular where the pain is localized. She states the pain is exacerbated with eating, causing her to eat less. She has tried taking ibuprofen (2 pills every 6-8 hours) and tylenol with some relief. She reports her last dose of both at 6 PM. She states she will not be able to see her dentist until 10/5. Pt denies fever, chills, nausea, and vomiting.  Patient is a smoker.  Pt has NKDA.  Past Medical History  Diagnosis Date  . Depression   . Thyroid disease     hypothyroidism  . Endometriosis   . Morbid obesity   . Bilateral ovarian cysts    Past Surgical History  Procedure Laterality Date  . Appendectomy    . Tonsillectomy and adenoidectomy    . Laparoscopic ovarian cystectomy  2002    rt ovary  . Adenoidectomy     Family History  Problem Relation Age of Onset  . Heart disease Mother    Social History  Substance Use Topics  . Smoking status: Current Every Day Smoker -- 0.50 packs/day    Types: Cigarettes  . Smokeless tobacco: Never Used  . Alcohol Use: No   OB History    Gravida Para Term Preterm AB TAB SAB Ectopic Multiple Living   0 0             Review of Systems  Constitutional: Negative for fever, chills and appetite change.  HENT: Positive for dental problem. Negative for drooling, ear pain, facial swelling, nosebleeds, postnasal  drip, rhinorrhea and trouble swallowing.   Eyes: Negative for pain and redness.  Respiratory: Negative for cough and wheezing.   Cardiovascular: Negative for chest pain.  Gastrointestinal: Negative for nausea, vomiting and abdominal pain.  Musculoskeletal: Negative for neck pain and neck stiffness.  Skin: Negative for color change and rash.  Neurological: Positive for headaches. Negative for weakness and light-headedness.  All other systems reviewed and are negative.   Allergies  Review of patient's allergies indicates no known allergies.  Home Medications   Prior to Admission medications   Medication Sig Start Date End Date Taking? Authorizing Provider  ALPRAZolam [name]) 0.5 MG tablet TAKE 1 TABLET BY MOUTH TWICE A DAY FOR ANXIETY 07/18/14   Elby Showers, MD  azithromycin (ZITHROMAX) 250 MG tablet 2 by mouth day one followed by 1 by mouth days 2 through 5 07/18/14   Elby Showers, MD  ibuprofen (ADVIL,MOTRIN) 800 MG tablet Take 1 tablet (800 mg total) by mouth 3 (three) times daily. 11/13/14   Azir Muzyka, PA-C  levothyroxine (SYNTHROID, LEVOTHROID) 88 MCG tablet TAKE 1 TABLET BY MOUTH EVERY MORNING BEFORE BREAKFAST 10/21/14   Elby Showers, MD  penicillin v potassium (VEETID) 500 MG tablet Take 1 tablet (500 mg total) by mouth 3 (three) times daily. 11/13/14   Jarrett Soho Renika Shiflet, PA-C  BP 131/88 mmHg  Pulse 96  Temp(Src) 98.7 F (37.1 C) (Oral)  Resp 16  Ht 5\' 5"  (1.651 m)  Wt 218 lb (98.884 kg)  BMI 36.28 kg/m2  SpO2 99%  LMP 10/12/2014 Physical Exam  Constitutional: She appears well-developed and well-nourished.  HENT:  Head: Normocephalic.  Right Ear: Tympanic membrane, external ear and ear canal normal.  Left Ear: Tympanic membrane, external ear and ear canal normal.  Nose: Nose normal. Right sinus exhibits no maxillary sinus tenderness and no frontal sinus tenderness. Left sinus exhibits no maxillary sinus tenderness and no frontal sinus tenderness.   Mouth/Throat: Uvula is midline, oropharynx is clear and moist and mucous membranes are normal. No oral lesions. Abnormal dentition. Dental caries present. No uvula swelling or lacerations. No oropharyngeal exudate, posterior oropharyngeal edema, posterior oropharyngeal erythema or tonsillar abscesses.    No gingival swelling, fluctuance or induration No gross abscess Dental caries throughout - visible at tooth #7, 16 and 25 where patient is having pain  Eyes: Conjunctivae are normal. Pupils are equal, round, and reactive to light. Right eye exhibits no discharge. Left eye exhibits no discharge.  Neck: Normal range of motion. Neck supple.  No stridor Handling secretions without difficulty No nuchal rigidity No cervical lymphadenopathy   Cardiovascular: Normal rate, regular rhythm and normal heart sounds.   Pulmonary/Chest: Effort normal. No respiratory distress.  Equal chest rise  Abdominal: Soft. Bowel sounds are normal. She exhibits no distension. There is no tenderness.  Lymphadenopathy:    She has no cervical adenopathy.  Neurological: She is alert.  Skin: Skin is warm and dry.  Psychiatric: She has a normal mood and affect.  Nursing note and vitals reviewed.   ED Course  Procedures  DIAGNOSTIC STUDIES: Oxygen Saturation is 99% on RA, normal by my interpretation.    COORDINATION OF CARE: 8:57 PM - Discussed plans to order penicillin. Will refer to a dentist. Pt advised of plan for treatment and pt agrees.  Labs Review Labs Reviewed - No data to display  Imaging Review No results found. I have personally reviewed and evaluated these images and lab results as part of my medical decision-making.   EKG Interpretation None      MDM   Final diagnoses:  Pain due to dental caries   [name] presents with toothache.  No gross abscess.  Exam unconcerning for Ludwig's angina or spread of infection.  Will treat with penicillin and anti-inflammatory medications.   Urged patient to follow-up with dentist.    BP 131/88 mmHg  Pulse 96  Temp(Src) 98.7 F (37.1 C) (Oral)  Resp 16  Ht 5\' 5"  (1.651 m)  Wt 218 lb (98.884 kg)  BMI 36.28 kg/m2  SpO2 99%  LMP 10/12/2014     Matia Zelada, PA-C 11/13/14 2106  Gareth Morgan, MD 11/16/14 1235

## 2014-11-13 NOTE — ED Notes (Signed)
Pt. reports multiple dental pain for several days unrelieved by OTC pain medication .

## 2014-11-13 NOTE — Discharge Instructions (Signed)
1. Medications: ibuprofen, penicillin, usual home medications 2. Treatment: rest, drink plenty of fluids, take medications as prescribed 3. Follow Up: Please followup with dentistry within 1 week for discussion of your diagnoses and further evaluation after today's visit; if you do not have a primary care doctor use the resource guide provided to find one; Return to the ER for high fevers, difficulty breathing, difficulty swallowing or other concerning symptoms    Dental Caries Dental caries (also called tooth decay) is the most common oral disease. It can occur at any age but is more common in children and young adults.  HOW DENTAL CARIES DEVELOPS  The process of decay begins when bacteria and foods (particularly sugars and starches) combine in your mouth to produce plaque. Plaque is a substance that sticks to the hard, outer surface of a tooth (enamel). The bacteria in plaque produce acids that attack enamel. These acids may also attack the root surface of a tooth (cementum) if it is exposed. Repeated attacks dissolve these surfaces and create holes in the tooth (cavities). If left untreated, the acids destroy the other layers of the tooth.  RISK FACTORS  Frequent sipping of sugary beverages.   Frequent snacking on sugary and starchy foods, especially those that easily get stuck in the teeth.   Poor oral hygiene.   Dry mouth.   Substance abuse such as methamphetamine abuse.   Broken or poor-fitting dental restorations.   Eating disorders.   Gastroesophageal reflux disease (GERD).   Certain radiation treatments to the head and neck. SYMPTOMS In the early stages of dental caries, symptoms are seldom present. Sometimes white, chalky areas may be seen on the enamel or other tooth layers. In later stages, symptoms may include:  Pits and holes on the enamel.  Toothache after sweet, hot, or cold foods or drinks are consumed.  Pain around the tooth.  Swelling around the  tooth. DIAGNOSIS  Most of the time, dental caries is detected during a regular dental checkup. A diagnosis is made after a thorough medical and dental history is taken and the surfaces of your teeth are checked for signs of dental caries. Sometimes special instruments, such as lasers, are used to check for dental caries. Dental X-ray exams may be taken so that areas not visible to the eye (such as between the contact areas of the teeth) can be checked for cavities.  TREATMENT  If dental caries is in its early stages, it may be reversed with a fluoride treatment or an application of a remineralizing agent at the dental office. Thorough brushing and flossing at home is needed to aid these treatments. If it is in its later stages, treatment depends on the location and extent of tooth destruction:   If a small area of the tooth has been destroyed, the destroyed area will be removed and cavities will be filled with a material such as gold, silver amalgam, or composite resin.   If a large area of the tooth has been destroyed, the destroyed area will be removed and a cap (crown) will be fitted over the remaining tooth structure.   If the center part of the tooth (pulp) is affected, a procedure called a root canal will be needed before a filling or crown can be placed.   If most of the tooth has been destroyed, the tooth may need to be pulled (extracted). HOME CARE INSTRUCTIONS You can prevent, stop, or reverse dental caries at home by practicing good oral hygiene. Good oral hygiene includes:  Thoroughly cleaning your teeth at least twice a day with a toothbrush and dental floss.   Using a fluoride toothpaste. A fluoride mouth rinse may also be used if recommended by your dentist or health care provider.   Restricting the amount of sugary and starchy foods and sugary liquids you consume.   Avoiding frequent snacking on these foods and sipping of these liquids.   Keeping regular visits with a  dentist for checkups and cleanings. PREVENTION   Practice good oral hygiene.  Consider a dental sealant. A dental sealant is a coating material that is applied by your dentist to the pits and grooves of teeth. The sealant prevents food from being trapped in them. It may protect the teeth for several years.  Ask about fluoride supplements if you live in a community without fluorinated water or with water that has a low fluoride content. Use fluoride supplements as directed by your dentist or health care provider.  Allow fluoride varnish applications to teeth if directed by your dentist or health care provider. Document Released: 11/09/2001 Document Revised: 07/04/2013 Document Reviewed: 02/20/2012 [facility] Patient Information 2015 [place], [place]. This information is not intended to replace advice given to you by your health care provider. Make sure you discuss any questions you have with your health care provider.

## 2014-11-27 ENCOUNTER — Encounter: Payer: Self-pay | Admitting: Internal Medicine

## 2014-11-27 ENCOUNTER — Ambulatory Visit (INDEPENDENT_AMBULATORY_CARE_PROVIDER_SITE_OTHER): Payer: Medicaid Other | Admitting: Internal Medicine

## 2014-11-27 ENCOUNTER — Other Ambulatory Visit: Payer: Self-pay | Admitting: Internal Medicine

## 2014-11-27 VITALS — BP 120/82 | HR 90 | Temp 97.9°F | Ht 65.0 in | Wt 214.0 lb

## 2014-11-27 DIAGNOSIS — Z658 Other specified problems related to psychosocial circumstances: Secondary | ICD-10-CM | POA: Diagnosis not present

## 2014-11-27 DIAGNOSIS — R739 Hyperglycemia, unspecified: Secondary | ICD-10-CM | POA: Diagnosis not present

## 2014-11-27 DIAGNOSIS — E785 Hyperlipidemia, unspecified: Secondary | ICD-10-CM | POA: Diagnosis not present

## 2014-11-27 DIAGNOSIS — H6691 Otitis media, unspecified, right ear: Secondary | ICD-10-CM | POA: Diagnosis not present

## 2014-11-27 DIAGNOSIS — Z720 Tobacco use: Secondary | ICD-10-CM | POA: Diagnosis not present

## 2014-11-27 DIAGNOSIS — Z23 Encounter for immunization: Secondary | ICD-10-CM | POA: Diagnosis not present

## 2014-11-27 DIAGNOSIS — F439 Reaction to severe stress, unspecified: Secondary | ICD-10-CM

## 2014-11-27 DIAGNOSIS — Z0000 Encounter for general adult medical examination without abnormal findings: Secondary | ICD-10-CM

## 2014-11-27 DIAGNOSIS — Z87891 Personal history of nicotine dependence: Secondary | ICD-10-CM

## 2014-11-27 DIAGNOSIS — F70 Mild intellectual disabilities: Secondary | ICD-10-CM

## 2014-11-27 DIAGNOSIS — H6692 Otitis media, unspecified, left ear: Secondary | ICD-10-CM

## 2014-11-27 DIAGNOSIS — F411 Generalized anxiety disorder: Secondary | ICD-10-CM | POA: Diagnosis not present

## 2014-11-27 LAB — POCT URINALYSIS DIPSTICK
BILIRUBIN UA: NEGATIVE
Blood, UA: NEGATIVE
GLUCOSE UA: NEGATIVE
Ketones, UA: NEGATIVE
Leukocytes, UA: NEGATIVE
Nitrite, UA: NEGATIVE
Protein, UA: NEGATIVE
SPEC GRAV UA: 1.02
UROBILINOGEN UA: NEGATIVE
pH, UA: 6

## 2014-11-27 MED ORDER — ALPRAZOLAM 0.5 MG PO TABS: ORAL_TABLET | ORAL | Status: DC

## 2014-11-27 MED ORDER — AZITHROMYCIN 250 MG PO TABS: ORAL_TABLET | ORAL | Status: DC

## 2014-11-27 NOTE — Patient Instructions (Signed)
Take Zithromax Z-PAK as directed for bilateral air infection. Xanax refilled. Please consider stopping smoking. Return in 6 months. Please have GYN exam.
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
## 2014-12-08 ENCOUNTER — Emergency Department (HOSPITAL_COMMUNITY)
Admission: EM | Admit: 2014-12-08 | Discharge: 2014-12-08 | Disposition: A | Discharge status: Home / Self Care | Payer: Medicaid Other | Attending: Emergency Medicine | Admitting: Emergency Medicine

## 2014-12-08 ENCOUNTER — Emergency Department (HOSPITAL_COMMUNITY): Payer: Medicaid Other

## 2014-12-08 ENCOUNTER — Encounter (HOSPITAL_COMMUNITY): Payer: Self-pay | Admitting: Emergency Medicine

## 2014-12-08 DIAGNOSIS — F329 Major depressive disorder, single episode, unspecified: Secondary | ICD-10-CM | POA: Insufficient documentation

## 2014-12-08 DIAGNOSIS — Z8742 Personal history of other diseases of the female genital tract: Secondary | ICD-10-CM | POA: Insufficient documentation

## 2014-12-08 DIAGNOSIS — Z720 Tobacco use: Secondary | ICD-10-CM | POA: Insufficient documentation

## 2014-12-08 DIAGNOSIS — Y9389 Activity, other specified: Secondary | ICD-10-CM | POA: Insufficient documentation

## 2014-12-08 DIAGNOSIS — W19XXXA Unspecified fall, initial encounter: Secondary | ICD-10-CM

## 2014-12-08 DIAGNOSIS — E079 Disorder of thyroid, unspecified: Secondary | ICD-10-CM | POA: Insufficient documentation

## 2014-12-08 DIAGNOSIS — Y998 Other external cause status: Secondary | ICD-10-CM | POA: Diagnosis not present

## 2014-12-08 DIAGNOSIS — T148XXA Other injury of unspecified body region, initial encounter: Secondary | ICD-10-CM

## 2014-12-08 DIAGNOSIS — S6991XA Unspecified injury of right wrist, hand and finger(s), initial encounter: Secondary | ICD-10-CM | POA: Insufficient documentation

## 2014-12-08 DIAGNOSIS — T148 Other injury of unspecified body region: Secondary | ICD-10-CM | POA: Diagnosis not present

## 2014-12-08 DIAGNOSIS — S59911A Unspecified injury of right forearm, initial encounter: Secondary | ICD-10-CM | POA: Diagnosis not present

## 2014-12-08 DIAGNOSIS — Y9289 Other specified places as the place of occurrence of the external cause: Secondary | ICD-10-CM | POA: Insufficient documentation

## 2014-12-08 DIAGNOSIS — W1839XA Other fall on same level, initial encounter: Secondary | ICD-10-CM | POA: Diagnosis not present

## 2014-12-08 MED ORDER — OXYCODONE HCL ER 10 MG PO T12A
10.0000 mg | EXTENDED_RELEASE_TABLET | Freq: Two times a day (BID) | ORAL | Status: DC
  Administered 2014-12-08: 10 mg via ORAL
  Filled 2014-12-08: qty 1

## 2014-12-08 MED ORDER — IBUPROFEN 600 MG PO TABS: 600.0000 mg | ORAL_TABLET | Freq: Four times a day (QID) | ORAL | Status: DC | PRN

## 2014-12-08 MED ORDER — HYDROCODONE-ACETAMINOPHEN 5-325 MG PO TABS: 1.0000 | ORAL_TABLET | Freq: Four times a day (QID) | ORAL | Status: DC | PRN

## 2014-12-08 NOTE — ED Provider Notes (Signed)
CSN: [id number]     Arrival date & time 12/08/14  0204 History   By signing my name below, I, Forrestine Him, attest that this documentation has been prepared under the direction and in the presence of Varney Biles, MD. Electronically Signed: Forrestine Him, ED Scribe. 12/08/2014. 3:32 AM.   Chief Complaint  Patient presents with  . Fall   The history is provided by the patient. No language interpreter was used.    HPI Comments: [name], R hand dominant is a 34 y.o. female with a PMHx of thyroid disease who presents to the Emergency Department here after a fall sustained at 1:30 AM this morning. Pt states she was out walking her dog when he/she dragged her across the pavement. No head trauma or LOC. She now c/o constant, ongoing pain and swelling to the R wrist. Discomfort is made worse with certain movements and palpation. No alleviating factors a this time. No OTC medications or home remedies attempted prior to arrival. Denies any recent fever, chills, nausea, vomiting, or abdominal pain. No numbness, loss of sensation, or weakness to R arm. No known allergies to medications.  Past Medical History  Diagnosis Date  . Depression   . Thyroid disease     hypothyroidism  . Endometriosis   . Morbid obesity ([place])   . Bilateral ovarian cysts    Past Surgical History  Procedure Laterality Date  . Appendectomy    . Tonsillectomy and adenoidectomy    . Laparoscopic ovarian cystectomy  2002    rt ovary  . Adenoidectomy     Family History  Problem Relation Age of Onset  . Heart disease Mother    Social History  Substance Use Topics  . Smoking status: Current Every Day Smoker -- 0.00 packs/day    Types: Cigarettes  . Smokeless tobacco: Never Used  . Alcohol Use: No   OB History    Gravida Para Term Preterm AB TAB SAB Ectopic Multiple Living   0 0             Review of Systems  Constitutional: Negative for fever and chills.  Gastrointestinal: Negative for nausea, vomiting  and abdominal pain.  Musculoskeletal: Positive for joint swelling and arthralgias.  Neurological: Negative for weakness and numbness.  Psychiatric/Behavioral: Negative for confusion.  All other systems reviewed and are negative.     Allergies  Review of patient's allergies indicates no known allergies.  Home Medications   Prior to Admission medications   Medication Sig Start Date End Date Taking? Authorizing Provider  ALPRAZolam [name]) 0.5 MG tablet TAKE 1 TABLET BY MOUTH TWICE A DAY FOR ANXIETY 11/27/14  Yes Elby Showers, MD  levothyroxine (SYNTHROID, LEVOTHROID) 88 MCG tablet TAKE 1 TABLET BY MOUTH EVERY MORNING BEFORE BREAKFAST 11/27/14  Yes Elby Showers, MD  azithromycin (ZITHROMAX Z-PAK) 250 MG tablet 2 tabs PO today then 1 tab daily for next 4 days Patient not taking: Reported on 12/08/2014 11/27/14   Elby Showers, MD  HYDROcodone-acetaminophen (NORCO/VICODIN) 5-325 MG tablet Take 1 tablet by mouth every 6 (six) hours as needed. 12/08/14   Varney Biles, MD  ibuprofen (ADVIL,MOTRIN) 600 MG tablet Take 1 tablet (600 mg total) by mouth every 6 (six) hours as needed. 12/08/14   Varney Biles, MD   Triage Vitals: BP 108/72 mmHg  Pulse 92  Temp(Src) 98.2 F (36.8 C) (Oral)  Resp 16  SpO2 99%  LMP 11/30/2014   Physical Exam  Constitutional: She is oriented to person,  place, and time. She appears well-developed and well-nourished. No distress.  HENT:  Head: Normocephalic and atraumatic.  Eyes: EOM are normal.  Neck: Normal range of motion.  Cardiovascular: Normal rate, regular rhythm and normal heart sounds.   Pulses:      Radial pulses are 2+ on the right side.  Capillary refill less than 2 seconds  Pulmonary/Chest: Effort normal and breath sounds normal.  Abdominal: Soft. She exhibits no distension. There is no tenderness.  Musculoskeletal: Normal range of motion. She exhibits edema and tenderness.  Swelling/edema to distal part of forearm of the R upper extremity No  anatomical snuffbox tenderness Focal R carpal bone tenderness  intrinsic and extrinsic muscles of the hand are working normally  Neurological: She is alert and oriented to person, place, and time.  Gross sensory exam is normal  Skin: Skin is warm and dry.  Psychiatric: She has a normal mood and affect. Judgment normal.  Nursing note and vitals reviewed.   ED Course  Procedures (including critical care time)  DIAGNOSTIC STUDIES: Oxygen Saturation is 99% on RA, Normal by my interpretation.    COORDINATION OF CARE: 3:22 AM- Will order DG wrist complete R and DG forearm R. Will give OxyContin. Discussed treatment plan with pt at bedside and pt agreed to plan.     Labs Review Labs Reviewed - No data to display  Imaging Review Dg Wrist Complete Right  12/08/2014   CLINICAL DATA:  Status post fall, with right wrist pain and swelling. Initial encounter.  EXAM: RIGHT WRIST - COMPLETE 3+ VIEW  COMPARISON:  None.  FINDINGS: There is no evidence of fracture or dislocation. The carpal rows are intact, and demonstrate normal alignment. The joint spaces are preserved. Mild negative ulnar variance is noted.  No significant soft tissue abnormalities are seen.  IMPRESSION: No evidence of fracture or dislocation.   Electronically Signed   By: Garald Balding M.D.   On: 12/08/2014 02:46   I have personally reviewed and evaluated these images and lab results as part of my medical decision-making.   EKG Interpretation None      MDM   Final diagnoses:  Fall, initial encounter  Contusion    I personally performed the services described in this documentation, which was scribed in my presence. The recorded information has been reviewed and is accurate.  Pt comes in with cc of fall. She as significant swelling on the distal forearm on the dorsal aspect, and i had high clinical suspicion of a fracture. Xrays however reveal no fractures. On reassessment, pt is able to supinate and pronate with  discomfort and also able to flex and extend the wrist. Will be conservative and place her in a short arm splnt and have Ortho f/u in 1 week for repeat evaluation. She is neuro vasculalarly intact.   Varney Biles, MD 12/08/14 [phone]

## 2014-12-08 NOTE — Discharge Instructions (Signed)
The Xrays are normal. Still, you are having severe pain, so we will place your arm in a splint and send you with an orthopedic follow up. Please call and set up an appointment for next week.  Contusion A contusion is a deep bruise. Contusions are the result of a blunt injury to tissues and muscle fibers under the skin. The injury causes bleeding under the skin. The skin overlying the contusion may turn blue, purple, or yellow. Minor injuries will give you a painless contusion, but more severe contusions may stay painful and swollen for a few weeks.  CAUSES  This condition is usually caused by a blow, trauma, or direct force to an area of the body. SYMPTOMS  Symptoms of this condition include:  Swelling of the injured area.  Pain and tenderness in the injured area.  Discoloration. The area may have redness and then turn blue, purple, or yellow. DIAGNOSIS  This condition is diagnosed based on a physical exam and medical history. An X-ray, CT scan, or MRI may be needed to determine if there are any associated injuries, such as broken bones (fractures). TREATMENT  Specific treatment for this condition depends on what area of the body was injured. In general, the best treatment for a contusion is resting, icing, applying pressure to (compression), and elevating the injured area. This is often called the RICE strategy. Over-the-counter anti-inflammatory medicines may also be recommended for pain control.  HOME CARE INSTRUCTIONS   Rest the injured area.  If directed, apply ice to the injured area:  Put ice in a plastic bag.  Place a towel between your skin and the bag.  Leave the ice on for 20 minutes, 2-3 times per day.  If directed, apply light compression to the injured area using an elastic bandage. Make sure the bandage is not wrapped too tightly. Remove and reapply the bandage as directed by your health care provider.  If possible, raise (elevate) the injured area above the level of  your heart while you are sitting or lying down.  Take over-the-counter and prescription medicines only as told by your health care provider. SEEK MEDICAL CARE IF:  Your symptoms do not improve after several days of treatment.  Your symptoms get worse.  You have difficulty moving the injured area. SEEK IMMEDIATE MEDICAL CARE IF:   You have severe pain.  You have numbness in a hand or foot.  Your hand or foot turns pale or cold.   This information is not intended to replace advice given to you by your health care provider. Make sure you discuss any questions you have with your health care provider.   Document Released: 11/27/2004 Document Revised: 11/08/2014 Document Reviewed: 07/05/2014 Elsevier Interactive Patient Education 2016 [place] DO? Elastic bandages come in different shapes and sizes. They generally provide support to your injury and reduce swelling while you are healing, but they can perform different functions. Your health care provider will help you to decide what is best for your protection, recovery, or rehabilitation following an injury. WHAT ARE SOME GENERAL TIPS FOR USING AN ELASTIC BANDAGE?  Use the bandage as directed by the maker of the bandage that you are using.  Do not wrap the bandage too tightly. This may cut off the circulation in the arm or leg in the area below the bandage.  If part of your body beyond the bandage becomes blue, numb, cold, swollen, or is more painful, your bandage  is most likely too tight. If this occurs, remove your bandage and reapply it more loosely.  See your health care provider if the bandage seems to be making your problems worse rather than better.  An elastic bandage should be removed and reapplied every 3-4 hours or as directed by your health care provider. WHAT IS RICE? The routine care of many injuries includes rest, ice, compression, and elevation (RICE therapy).    Rest Rest is required to allow your body to heal. Generally, you can resume your routine activities when you are comfortable and have been given permission by your health care provider. Ice Icing your injury helps to keep the swelling down and it reduces pain. Do not apply ice directly to your skin.  Put ice in a plastic bag.  Place a towel between your skin and the bag.  Leave the ice on for 20 minutes, 2-3 times per day. Do this for as long as you are directed by your health care provider. Compression Compression helps to keep swelling down, gives support, and helps with discomfort. Compression may be done with an elastic bandage. Elevation Elevation helps to reduce swelling and it decreases pain. If possible, your injured area should be placed at or above the level of your heart or the center of your chest. [place]? You should seek medical care if:  You have persistent pain and swelling.  Your symptoms are getting worse rather than improving. These symptoms may indicate that further evaluation or further X-rays are needed. Sometimes, X-rays may not show a small broken bone (fracture) until a number of days later. Make a follow-up appointment with your health care provider. Ask when your X-ray results will be ready. Make sure that you get your X-ray results. WHEN SHOULD I SEEK IMMEDIATE MEDICAL CARE? You should seek immediate medical care if:  You have a sudden onset of severe pain at or below the area of your injury.  You develop redness or increased swelling around your injury.  You have tingling or numbness at or below the area of your injury that does not improve after you remove the elastic bandage.   This information is not intended to replace advice given to you by your health care provider. Make sure you discuss any questions you have with your health care provider.   Document Released: 08/09/2001 Document Revised: 11/08/2014 Document Reviewed:  10/03/2013 Elsevier Interactive Patient Education [employer].

## 2014-12-08 NOTE — ED Notes (Addendum)
Pt. slipped and fell on wet grass this evening presents with right wrist pain/swelling , forehead swelling , denies LOC / ambulatory . Mild nasal bleeding .

## 2015-02-21 ENCOUNTER — Other Ambulatory Visit: Payer: Self-pay | Admitting: Internal Medicine

## 2015-02-22 NOTE — Telephone Encounter (Signed)
Refill x 3 months 

## 2015-02-22 NOTE — Telephone Encounter (Signed)
Phoned to pharmacy 

## 2015-05-09 ENCOUNTER — Other Ambulatory Visit: Payer: Self-pay | Admitting: Internal Medicine

## 2015-05-09 NOTE — Telephone Encounter (Signed)
Phoned to pharmacy 

## 2015-05-09 NOTE — Telephone Encounter (Signed)
Refill x 90 days 

## 2015-05-24 ENCOUNTER — Other Ambulatory Visit: Payer: Medicaid Other | Admitting: Internal Medicine

## 2015-05-28 ENCOUNTER — Ambulatory Visit (INDEPENDENT_AMBULATORY_CARE_PROVIDER_SITE_OTHER): Payer: Medicaid Other | Admitting: Internal Medicine

## 2015-05-28 ENCOUNTER — Encounter: Payer: Self-pay | Admitting: Internal Medicine

## 2015-05-28 VITALS — BP 124/80 | HR 88 | Temp 97.4°F | Resp 20 | Wt 218.0 lb

## 2015-05-28 DIAGNOSIS — E785 Hyperlipidemia, unspecified: Secondary | ICD-10-CM | POA: Diagnosis not present

## 2015-05-28 DIAGNOSIS — E039 Hypothyroidism, unspecified: Secondary | ICD-10-CM

## 2015-05-28 DIAGNOSIS — Z720 Tobacco use: Secondary | ICD-10-CM | POA: Diagnosis not present

## 2015-05-28 DIAGNOSIS — F411 Generalized anxiety disorder: Secondary | ICD-10-CM | POA: Diagnosis not present

## 2015-05-28 DIAGNOSIS — Z87891 Personal history of nicotine dependence: Secondary | ICD-10-CM

## 2015-05-28 LAB — LIPID PANEL
CHOLESTEROL: 185 mg/dL (ref 125–200)
HDL: 27 mg/dL — ABNORMAL LOW (ref >=46)
LDL Cholesterol: 126 mg/dL (ref <130)
Total CHOL/HDL Ratio: 6.9 Ratio — ABNORMAL HIGH (ref <=5.0)
Triglycerides: 162 mg/dL — ABNORMAL HIGH (ref <150)
VLDL: 32 mg/dL — ABNORMAL HIGH (ref <30)

## 2015-05-28 LAB — TSH: TSH: 1.49 mIU/L

## 2015-05-28 NOTE — Progress Notes (Signed)
   Subjective:    [name], female    DOB: Feb 06, 1981, 35 y.o.   MRN: [id number]  HPI In today for six-month recheck on anxiety, hyperlipidemia, hypothyroidism. Lipid panel and TSH drawn today. Results are pending. Continues to smoke. Remains overweight.Continues to smoke 10 cigarettes daily. Hasn't been able to stop. Fortunately, [name] and his girlfriend have moved out of the house. That is less stressful for her. Her mother is still sick from time to time. She has a history of Addison's disease and heart problems [name] says she tries to look after her mother's bed she can. Marriah says her anxiety has improved since [name] moved out. Recently felt nauseated and had some epigastric discomfort over the weekend. Seems to be resolving. No fever or chills. No diarrhea. TSH and fasting lipid panel drawn today. She did drink some sweet tea before coming to the office but did not eat food. Explained to her that she needed to work on diet and exercise. Her main exercise is walking the dog. Doesn't seem motivated to diet and exercise.     Review of Systems     Objective:   Physical Exam Neck is supple without thyromegaly. Chest clear to auscultation. Cardiac exam regular rate and rhythm normal S1 and S2. Extremities without edema. Hypothyroidism-TSH pending on thyroid replacement therapy  Hyperlipidemia-lipid panel pending       Assessment & Plan:  Hypothyroidism-TSH pending  Hyperlipidemia-fasting lipid panel pending  Anxiety-Xanax can be refilled for 6 months. Will need physical exam in 6 months.  History of smoking-not willing to quit at present time  Obesity-needs to be serious about diet and exercise. Cannot seem to motivate her. She does have some limitations mentally but doesn't seem to be motivated to see dietitian.

## 2015-05-28 NOTE — Patient Instructions (Signed)
Please work on diet and exercise. Please stop smoking. Lab work is pending. Return in 6 months for physical exam.

## 2015-06-05 ENCOUNTER — Other Ambulatory Visit: Payer: Self-pay | Admitting: Internal Medicine

## 2015-08-13 ENCOUNTER — Other Ambulatory Visit: Payer: Self-pay | Admitting: Internal Medicine

## 2015-08-13 NOTE — Telephone Encounter (Signed)
Refill x 3 months 

## 2015-11-07 ENCOUNTER — Other Ambulatory Visit: Payer: Self-pay | Admitting: Internal Medicine

## 2015-11-07 NOTE — Telephone Encounter (Signed)
Refill once has appt late September

## 2015-11-08 ENCOUNTER — Other Ambulatory Visit: Payer: Self-pay

## 2015-11-08 MED ORDER — ALPRAZOLAM 0.5 MG PO TABS: 0.5000 mg | ORAL_TABLET | Freq: Two times a day (BID) | ORAL | 0 refills | Status: DC | PRN

## 2015-11-15 ENCOUNTER — Ambulatory Visit (INDEPENDENT_AMBULATORY_CARE_PROVIDER_SITE_OTHER): Payer: Medicaid Other | Admitting: Internal Medicine

## 2015-11-15 DIAGNOSIS — M549 Dorsalgia, unspecified: Secondary | ICD-10-CM

## 2015-11-15 DIAGNOSIS — F411 Generalized anxiety disorder: Secondary | ICD-10-CM

## 2015-11-15 DIAGNOSIS — J069 Acute upper respiratory infection, unspecified: Secondary | ICD-10-CM

## 2015-11-15 MED ORDER — AZITHROMYCIN 250 MG PO TABS: ORAL_TABLET | ORAL | 0 refills | Status: DC

## 2015-11-15 MED ORDER — HYDROCODONE-ACETAMINOPHEN 5-325 MG PO TABS: 1.0000 | ORAL_TABLET | Freq: Four times a day (QID) | ORAL | 0 refills | Status: DC | PRN
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
## 2015-11-22 ENCOUNTER — Encounter: Payer: Self-pay | Admitting: Internal Medicine

## 2015-11-22 ENCOUNTER — Telehealth: Payer: Self-pay | Admitting: Internal Medicine

## 2015-11-22 ENCOUNTER — Other Ambulatory Visit: Payer: Medicaid Other | Admitting: Internal Medicine

## 2015-11-22 NOTE — Telephone Encounter (Signed)
Patient did not keep appointment today for fasting lab work. Has CPE scheduled September 28. Patient was contacted by phone. Said she had to go get her mother from the hospital today and forgot about her appointment. Says she will come fasting on September 28.

## 2015-11-28 ENCOUNTER — Encounter: Payer: Self-pay | Admitting: Internal Medicine

## 2015-11-28 NOTE — Patient Instructions (Signed)
Hydrocodone/APAP 5/325 one by mouth every 6 hours when necessary pain #30 no refill. Physical therapy orders written. Xanax 0.5 mg #60 one by mouth twice daily. Keep appointment for physical exam and fasting lab work.

## 2015-11-29 ENCOUNTER — Other Ambulatory Visit: Payer: Medicaid Other | Admitting: Internal Medicine

## 2015-11-29 ENCOUNTER — Other Ambulatory Visit: Payer: Self-pay | Admitting: Internal Medicine

## 2015-11-29 ENCOUNTER — Ambulatory Visit (INDEPENDENT_AMBULATORY_CARE_PROVIDER_SITE_OTHER): Payer: Medicaid Other | Admitting: Internal Medicine

## 2015-11-29 ENCOUNTER — Encounter: Payer: Self-pay | Admitting: Internal Medicine

## 2015-11-29 VITALS — BP 122/84 | HR 85 | Temp 97.6°F | Ht 64.5 in | Wt 211.5 lb

## 2015-11-29 DIAGNOSIS — Z23 Encounter for immunization: Secondary | ICD-10-CM | POA: Diagnosis not present

## 2015-11-29 DIAGNOSIS — E039 Hypothyroidism, unspecified: Secondary | ICD-10-CM

## 2015-11-29 DIAGNOSIS — Z0000 Encounter for general adult medical examination without abnormal findings: Secondary | ICD-10-CM | POA: Diagnosis not present

## 2015-11-29 DIAGNOSIS — R739 Hyperglycemia, unspecified: Secondary | ICD-10-CM | POA: Diagnosis not present

## 2015-11-29 DIAGNOSIS — M549 Dorsalgia, unspecified: Secondary | ICD-10-CM

## 2015-11-29 DIAGNOSIS — Z8659 Personal history of other mental and behavioral disorders: Secondary | ICD-10-CM

## 2015-11-29 LAB — COMPREHENSIVE METABOLIC PANEL
ALK PHOS: 53 U/L (ref 33–115)
ALT: 9 U/L (ref 6–29)
AST: 16 U/L (ref 10–30)
Albumin: 3.6 g/dL (ref 3.6–5.1)
BILIRUBIN TOTAL: 0.4 mg/dL (ref 0.2–1.2)
BUN: 5 mg/dL — AB (ref 7–25)
CALCIUM: 8.6 mg/dL (ref 8.6–10.2)
CO2: 23 mmol/L (ref 20–31)
CREATININE: 0.58 mg/dL (ref 0.50–1.10)
Chloride: 104 mmol/L (ref 98–110)
GLUCOSE: 111 mg/dL — AB (ref 65–99)
Potassium: 3.8 mmol/L (ref 3.5–5.3)
SODIUM: 137 mmol/L (ref 135–146)
Total Protein: 6 g/dL — ABNORMAL LOW (ref 6.1–8.1)

## 2015-11-29 LAB — POCT URINALYSIS DIPSTICK
Bilirubin, UA: NEGATIVE
Glucose, UA: NEGATIVE
Ketones, UA: NEGATIVE
Leukocytes, UA: NEGATIVE
NITRITE UA: NEGATIVE
PH UA: 7
PROTEIN UA: NEGATIVE
UROBILINOGEN UA: NEGATIVE

## 2015-11-29 LAB — CBC WITH DIFFERENTIAL/PLATELET
BASOS PCT: 0 %
Basophils Absolute: 0 cells/uL (ref 0–200)
EOS PCT: 3 %
Eosinophils Absolute: 225 cells/uL (ref 15–500)
HEMATOCRIT: 40.7 % (ref 35.0–45.0)
HEMOGLOBIN: 13.6 g/dL (ref 11.7–15.5)
LYMPHS ABS: 2625 {cells}/uL (ref 850–3900)
LYMPHS PCT: 35 %
MCH: 28.5 pg (ref 27.0–33.0)
MCHC: 33.4 g/dL (ref 32.0–36.0)
MCV: 85.1 fL (ref 80.0–100.0)
MONO ABS: 600 {cells}/uL (ref 200–950)
MPV: 9.8 fL (ref 7.5–12.5)
Monocytes Relative: 8 %
Neutro Abs: 4050 cells/uL (ref 1500–7800)
Neutrophils Relative %: 54 %
Platelets: 433 10*3/uL — ABNORMAL HIGH (ref 140–400)
RBC: 4.78 MIL/uL (ref 3.80–5.10)
RDW: 14.3 % (ref 11.0–15.0)
WBC: 7.5 10*3/uL (ref 3.8–10.8)

## 2015-11-29 LAB — LIPID PANEL
Cholesterol: 185 mg/dL (ref 125–200)
HDL: 36 mg/dL — AB (ref >=46)
LDL CALC: 126 mg/dL (ref <130)
Total CHOL/HDL Ratio: 5.1 Ratio — ABNORMAL HIGH (ref <=5.0)
Triglycerides: 116 mg/dL (ref <150)
VLDL: 23 mg/dL (ref <30)

## 2015-11-29 LAB — TSH: TSH: 2.06 m[IU]/L

## 2015-11-29 MED ORDER — CYCLOBENZAPRINE HCL 10 MG PO TABS: 10.0000 mg | ORAL_TABLET | Freq: Every day | ORAL | 0 refills | Status: DC

## 2015-11-29 MED ORDER — CITALOPRAM HYDROBROMIDE 40 MG PO TABS: 40.0000 mg | ORAL_TABLET | Freq: Every day | ORAL | 3 refills | Status: DC

## 2015-11-29 MED ORDER — PREDNISONE 10 MG PO TABS: ORAL_TABLET | ORAL | 0 refills | Status: DC

## 2015-11-29 MED ORDER — HYDROCODONE-ACETAMINOPHEN 10-325 MG PO TABS: ORAL_TABLET | ORAL | 0 refills | Status: DC

## 2015-11-29 MED ORDER — MUPIROCIN 2 % EX OINT: 1.0000 "application " | TOPICAL_OINTMENT | Freq: Two times a day (BID) | CUTANEOUS | 0 refills | Status: DC

## 2015-11-29 NOTE — Patient Instructions (Addendum)
Have ordered Sterapred DS 10 mg 6 day dosepak, Flexeril, hydrocodone 10/325 for back pain. She has yet to start physical therapy. Continue same dose of thyroid medication. Has refill on Xanax for anxiety. Not motivated to diet exercise and lose weight. Flu vaccine given. Restart Celexa for depression. Return in 6 months.
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
## 2015-11-30 ENCOUNTER — Telehealth: Payer: Self-pay | Admitting: *Deleted

## 2015-11-30 LAB — VITAMIN D 25 HYDROXY (VIT D DEFICIENCY, FRACTURES): VIT D 25 HYDROXY: 26 ng/mL — AB (ref 30–100)

## 2015-11-30 NOTE — Telephone Encounter (Signed)
hgb a1c added

## 2015-12-01 LAB — HEMOGLOBIN A1C
Hgb A1c MFr Bld: 5 % (ref <5.7)
MEAN PLASMA GLUCOSE: 97 mg/dL

## 2015-12-03 ENCOUNTER — Other Ambulatory Visit: Payer: Self-pay | Admitting: *Deleted

## 2015-12-03 ENCOUNTER — Telehealth: Payer: Self-pay | Admitting: Internal Medicine

## 2015-12-03 ENCOUNTER — Telehealth: Payer: Self-pay | Admitting: *Deleted

## 2015-12-03 MED ORDER — ALPRAZOLAM 0.5 MG PO TABS: 0.5000 mg | ORAL_TABLET | Freq: Two times a day (BID) | ORAL | 0 refills | Status: DC | PRN

## 2015-12-03 MED ORDER — HYDROCODONE-ACETAMINOPHEN 5-325 MG PO TABS: 1.0000 | ORAL_TABLET | Freq: Four times a day (QID) | ORAL | 0 refills | Status: DC | PRN

## 2015-12-03 NOTE — Telephone Encounter (Signed)
Refill xanax x 3 months

## 2015-12-03 NOTE — Telephone Encounter (Signed)
Patient came to pick up Rx for her pain medication and advised that she also needs a refill on her Xanax please.  Can we call her a refill in on her Xanax to her pharmacy please?    Thank you.

## 2015-12-03 NOTE — Telephone Encounter (Signed)
Pt notified of Rx ready for pick up.

## 2015-12-03 NOTE — Telephone Encounter (Signed)
Done

## 2015-12-10 ENCOUNTER — Other Ambulatory Visit: Payer: Self-pay | Admitting: Internal Medicine

## 2016-02-08 ENCOUNTER — Ambulatory Visit (INDEPENDENT_AMBULATORY_CARE_PROVIDER_SITE_OTHER): Payer: Medicaid Other | Admitting: Internal Medicine

## 2016-02-08 ENCOUNTER — Encounter: Payer: Self-pay | Admitting: Internal Medicine

## 2016-02-08 VITALS — BP 100/70 | HR 92 | Temp 97.6°F | Wt 195.0 lb

## 2016-02-08 DIAGNOSIS — M545 Low back pain, unspecified: Secondary | ICD-10-CM

## 2016-02-08 DIAGNOSIS — F411 Generalized anxiety disorder: Secondary | ICD-10-CM

## 2016-02-08 DIAGNOSIS — F70 Mild intellectual disabilities: Secondary | ICD-10-CM

## 2016-02-08 DIAGNOSIS — Z639 Problem related to primary support group, unspecified: Secondary | ICD-10-CM

## 2016-02-08 DIAGNOSIS — Z87891 Personal history of nicotine dependence: Secondary | ICD-10-CM

## 2016-02-08 DIAGNOSIS — J209 Acute bronchitis, unspecified: Secondary | ICD-10-CM

## 2016-02-08 MED ORDER — PREDNISONE 10 MG PO TABS: ORAL_TABLET | ORAL | 0 refills | Status: DC

## 2016-02-08 MED ORDER — AZITHROMYCIN 250 MG PO TABS: ORAL_TABLET | ORAL | 0 refills | Status: DC

## 2016-02-08 MED ORDER — HYDROCODONE-ACETAMINOPHEN 5-325 MG PO TABS: 1.0000 | ORAL_TABLET | Freq: Two times a day (BID) | ORAL | 0 refills | Status: DC

## 2016-02-14 ENCOUNTER — Telehealth: Payer: Self-pay | Admitting: Internal Medicine

## 2016-02-14 NOTE — Telephone Encounter (Signed)
Called [facility] to follow up on authorization for patient on her Hydrocodone today.  Had not heard back from them.  Spoke with Janett Billow @ [phone] in the pharmacy department.  She advised that this was approved on 12/9.  No reason as to why they had not called [place] back or faxed the information back to [place] or to the pharmacy.  PA [id number] x 30 days.    Patient's Mother, Babs Bertin called prior to this and states that they paid $28 for the Rx because patient needed the Rx.  Advised we had been waiting on the PA.    Called the pharmacy and left the PA # on the pharmacy line.  Advised they could call us back with any questions they may have.

## 2016-02-14 NOTE — Telephone Encounter (Signed)
Reference # for the call to [place] if [id number].

## 2016-03-02 NOTE — Patient Instructions (Signed)
Take tapering course of prednisone is directed. Take Zithromax Z-PAK for respiratory infection. Take Norco sparingly for pain and that will also help with cough. Rest and drink plenty of fluids. Try not to do heavy lifting. Please consider physical therapy. You were given order previously but it has not been arranged. Please call them.
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
## 2016-04-02 ENCOUNTER — Other Ambulatory Visit: Payer: Self-pay | Admitting: Internal Medicine

## 2016-04-02 NOTE — Telephone Encounter (Signed)
Refill x 90 days 

## 2016-04-07 ENCOUNTER — Emergency Department (HOSPITAL_COMMUNITY): Payer: Medicaid Other

## 2016-04-07 ENCOUNTER — Encounter (HOSPITAL_COMMUNITY): Payer: Self-pay | Admitting: *Deleted

## 2016-04-07 ENCOUNTER — Inpatient Hospital Stay (HOSPITAL_COMMUNITY)
Admission: EM | Admit: 2016-04-07 | Discharge: 2016-04-17 | DRG: 460 | Disposition: A | Discharge status: Home / Self Care | Payer: Medicaid Other | Attending: Neurosurgery | Admitting: Neurosurgery

## 2016-04-07 DIAGNOSIS — R402142 Coma scale, eyes open, spontaneous, at arrival to emergency department: Secondary | ICD-10-CM | POA: Diagnosis present

## 2016-04-07 DIAGNOSIS — R200 Anesthesia of skin: Secondary | ICD-10-CM | POA: Diagnosis present

## 2016-04-07 DIAGNOSIS — S32019A Unspecified fracture of first lumbar vertebra, initial encounter for closed fracture: Secondary | ICD-10-CM | POA: Diagnosis present

## 2016-04-07 DIAGNOSIS — M79609 Pain in unspecified limb: Secondary | ICD-10-CM | POA: Diagnosis not present

## 2016-04-07 DIAGNOSIS — S32011A Stable burst fracture of first lumbar vertebra, initial encounter for closed fracture: Principal | ICD-10-CM | POA: Diagnosis present

## 2016-04-07 DIAGNOSIS — F329 Major depressive disorder, single episode, unspecified: Secondary | ICD-10-CM | POA: Diagnosis present

## 2016-04-07 DIAGNOSIS — R339 Retention of urine, unspecified: Secondary | ICD-10-CM | POA: Diagnosis not present

## 2016-04-07 DIAGNOSIS — M545 Low back pain: Secondary | ICD-10-CM | POA: Diagnosis present

## 2016-04-07 DIAGNOSIS — N319 Neuromuscular dysfunction of bladder, unspecified: Secondary | ICD-10-CM | POA: Diagnosis not present

## 2016-04-07 DIAGNOSIS — R402252 Coma scale, best verbal response, oriented, at arrival to emergency department: Secondary | ICD-10-CM | POA: Diagnosis present

## 2016-04-07 DIAGNOSIS — S32001A Stable burst fracture of unspecified lumbar vertebra, initial encounter for closed fracture: Secondary | ICD-10-CM

## 2016-04-07 DIAGNOSIS — Z79899 Other long term (current) drug therapy: Secondary | ICD-10-CM

## 2016-04-07 DIAGNOSIS — G8220 Paraplegia, unspecified: Secondary | ICD-10-CM | POA: Diagnosis present

## 2016-04-07 DIAGNOSIS — M48061 Spinal stenosis, lumbar region without neurogenic claudication: Secondary | ICD-10-CM | POA: Diagnosis present

## 2016-04-07 DIAGNOSIS — Y92410 Unspecified street and highway as the place of occurrence of the external cause: Secondary | ICD-10-CM

## 2016-04-07 DIAGNOSIS — Z6831 Body mass index (BMI) 31.0-31.9, adult: Secondary | ICD-10-CM | POA: Diagnosis not present

## 2016-04-07 DIAGNOSIS — R739 Hyperglycemia, unspecified: Secondary | ICD-10-CM | POA: Diagnosis present

## 2016-04-07 DIAGNOSIS — S34101A Unspecified injury to L1 level of lumbar spinal cord, initial encounter: Secondary | ICD-10-CM

## 2016-04-07 DIAGNOSIS — R402362 Coma scale, best motor response, obeys commands, at arrival to emergency department: Secondary | ICD-10-CM | POA: Diagnosis present

## 2016-04-07 DIAGNOSIS — S32012A Unstable burst fracture of first lumbar vertebra, initial encounter for closed fracture: Secondary | ICD-10-CM

## 2016-04-07 DIAGNOSIS — F17210 Nicotine dependence, cigarettes, uncomplicated: Secondary | ICD-10-CM | POA: Diagnosis present

## 2016-04-07 DIAGNOSIS — N312 Flaccid neuropathic bladder, not elsewhere classified: Secondary | ICD-10-CM | POA: Diagnosis present

## 2016-04-07 DIAGNOSIS — S0083XA Contusion of other part of head, initial encounter: Secondary | ICD-10-CM | POA: Diagnosis present

## 2016-04-07 DIAGNOSIS — G8929 Other chronic pain: Secondary | ICD-10-CM | POA: Diagnosis present

## 2016-04-07 DIAGNOSIS — Z419 Encounter for procedure for purposes other than remedying health state, unspecified: Secondary | ICD-10-CM

## 2016-04-07 DIAGNOSIS — K592 Neurogenic bowel, not elsewhere classified: Secondary | ICD-10-CM | POA: Diagnosis present

## 2016-04-07 DIAGNOSIS — M5489 Other dorsalgia: Secondary | ICD-10-CM | POA: Diagnosis present

## 2016-04-07 DIAGNOSIS — Z791 Long term (current) use of non-steroidal anti-inflammatories (NSAID): Secondary | ICD-10-CM | POA: Diagnosis not present

## 2016-04-07 DIAGNOSIS — E039 Hypothyroidism, unspecified: Secondary | ICD-10-CM | POA: Diagnosis present

## 2016-04-07 DIAGNOSIS — E872 Acidosis: Secondary | ICD-10-CM | POA: Diagnosis present

## 2016-04-07 DIAGNOSIS — T1490XS Injury, unspecified, sequela: Secondary | ICD-10-CM | POA: Diagnosis present

## 2016-04-07 HISTORY — DX: Other cervical disc displacement, unspecified cervical region: M50.20

## 2016-04-07 HISTORY — DX: Other cervical disc degeneration, unspecified cervical region: M50.30

## 2016-04-07 LAB — COMPREHENSIVE METABOLIC PANEL
ALT: 12 U/L — AB (ref 14–54)
AST: 23 U/L (ref 15–41)
Albumin: 4 g/dL (ref 3.5–5.0)
Alkaline Phosphatase: 58 U/L (ref 38–126)
Anion gap: 11 (ref 5–15)
BUN: 6 mg/dL (ref 6–20)
CHLORIDE: 106 mmol/L (ref 101–111)
CO2: 21 mmol/L — AB (ref 22–32)
CREATININE: 0.74 mg/dL (ref 0.44–1.00)
Calcium: 9 mg/dL (ref 8.9–10.3)
GFR calc non Af Amer: >60 mL/min (ref >60)
Glucose, Bld: 142 mg/dL — ABNORMAL HIGH (ref 65–99)
POTASSIUM: 3.5 mmol/L (ref 3.5–5.1)
SODIUM: 138 mmol/L (ref 135–145)
Total Bilirubin: 0.7 mg/dL (ref 0.3–1.2)
Total Protein: 7.1 g/dL (ref 6.5–8.1)

## 2016-04-07 LAB — I-STAT CG4 LACTIC ACID, ED: Lactic Acid, Venous: 2.28 mmol/L (ref 0.5–1.9)

## 2016-04-07 LAB — ETHANOL: Alcohol, Ethyl (B): <5 mg/dL (ref <5)

## 2016-04-07 LAB — I-STAT CHEM 8, ED
BUN: 5 mg/dL — ABNORMAL LOW (ref 6–20)
Calcium, Ion: 0.97 mmol/L — ABNORMAL LOW (ref 1.15–1.40)
Chloride: 107 mmol/L (ref 101–111)
Creatinine, Ser: 0.6 mg/dL (ref 0.44–1.00)
GLUCOSE: 138 mg/dL — AB (ref 65–99)
HEMATOCRIT: 45 % (ref 36.0–46.0)
HEMOGLOBIN: 15.3 g/dL — AB (ref 12.0–15.0)
POTASSIUM: 3.6 mmol/L (ref 3.5–5.1)
Sodium: 141 mmol/L (ref 135–145)
TCO2: 23 mmol/L (ref 0–100)

## 2016-04-07 LAB — CBC
HCT: 44.6 % (ref 36.0–46.0)
Hemoglobin: 14.7 g/dL (ref 12.0–15.0)
MCH: 29 pg (ref 26.0–34.0)
MCHC: 33 g/dL (ref 30.0–36.0)
MCV: 88 fL (ref 78.0–100.0)
Platelets: 396 10*3/uL (ref 150–400)
RBC: 5.07 MIL/uL (ref 3.87–5.11)
RDW: 14.6 % (ref 11.5–15.5)
WBC: 14.3 10*3/uL — ABNORMAL HIGH (ref 4.0–10.5)

## 2016-04-07 LAB — I-STAT BETA HCG BLOOD, ED (MC, WL, AP ONLY)

## 2016-04-07 LAB — SAMPLE TO BLOOD BANK

## 2016-04-07 LAB — PROTIME-INR
INR: 1.19
PROTHROMBIN TIME: 15.2 s (ref 11.4–15.2)

## 2016-04-07 LAB — CDS SEROLOGY

## 2016-04-07 MED ORDER — FENTANYL CITRATE (PF) 100 MCG/2ML IJ SOLN
50.0000 ug | Freq: Once | INTRAMUSCULAR | Status: AC
  Administered 2016-04-07: 50 ug via INTRAVENOUS
  Filled 2016-04-07: qty 2

## 2016-04-07 MED ORDER — HYDROMORPHONE HCL 2 MG/ML IJ SOLN
1.0000 mg | Freq: Once | INTRAMUSCULAR | Status: AC
  Administered 2016-04-07: 1 mg via INTRAVENOUS
  Filled 2016-04-07: qty 1

## 2016-04-07 MED ORDER — HYDROMORPHONE HCL 2 MG/ML IJ SOLN
0.5000 mg | INTRAMUSCULAR | Status: DC | PRN
  Administered 2016-04-07 – 2016-04-08 (×2): 1 mg via INTRAVENOUS
  Filled 2016-04-07 (×2): qty 1

## 2016-04-07 MED ORDER — POTASSIUM CHLORIDE IN NACL 20-0.9 MEQ/L-% IV SOLN
INTRAVENOUS | Status: DC
  Administered 2016-04-08 – 2016-04-14 (×7): via INTRAVENOUS
  Filled 2016-04-07 (×10): qty 1000

## 2016-04-07 MED ORDER — IOPAMIDOL (ISOVUE-300) INJECTION 61%
INTRAVENOUS | Status: AC
  Administered 2016-04-07: 100 mL
  Filled 2016-04-07: qty 100

## 2016-04-07 MED ORDER — IOPAMIDOL (ISOVUE-300) INJECTION 61%: 100.0000 mL | Freq: Once | INTRAVENOUS | Status: DC | PRN
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
## 2016-04-07 NOTE — ED Notes (Signed)
Dr.Page at bedside updating pt

## 2016-04-07 NOTE — ED Notes (Signed)
Attempted to contact brother, Erlene Quan, [phone], no answer x 2

## 2016-04-07 NOTE — Progress Notes (Signed)
Orthopedic Tech Progress Note Patient Details:  [name] 11-21-1980 [id number]  Ortho Devices Type of Ortho Device: Other (comment) Ortho Device/Splint Location: Ortho Tech Not needed at this time.  Ortho Device/Splint Interventions: Other (comment)   Kristopher Oppenheim 04/07/2016, 9:16 PM

## 2016-04-08 ENCOUNTER — Inpatient Hospital Stay (HOSPITAL_COMMUNITY): Payer: Medicaid Other | Admitting: Certified Registered Nurse Anesthetist

## 2016-04-08 ENCOUNTER — Encounter (HOSPITAL_COMMUNITY): Payer: Self-pay | Admitting: *Deleted

## 2016-04-08 ENCOUNTER — Inpatient Hospital Stay (HOSPITAL_COMMUNITY): Payer: Medicaid Other

## 2016-04-08 ENCOUNTER — Encounter (HOSPITAL_COMMUNITY): Admission: EM | Disposition: A | Discharge status: Home / Self Care | Payer: Self-pay | Source: Home / Self Care

## 2016-04-08 HISTORY — PX: POSTERIOR LUMBAR FUSION 4 LEVEL: SHX6037

## 2016-04-08 LAB — CBC
HEMATOCRIT: 42.7 % (ref 36.0–46.0)
HEMOGLOBIN: 14 g/dL (ref 12.0–15.0)
MCH: 28.7 pg (ref 26.0–34.0)
MCHC: 32.8 g/dL (ref 30.0–36.0)
MCV: 87.7 fL (ref 78.0–100.0)
Platelets: 387 10*3/uL (ref 150–400)
RBC: 4.87 MIL/uL (ref 3.87–5.11)
RDW: 14.7 % (ref 11.5–15.5)
WBC: 18 10*3/uL — AB (ref 4.0–10.5)

## 2016-04-08 LAB — GLUCOSE, CAPILLARY
GLUCOSE-CAPILLARY: 151 mg/dL — AB (ref 65–99)
GLUCOSE-CAPILLARY: 95 mg/dL (ref 65–99)
Glucose-Capillary: 105 mg/dL — ABNORMAL HIGH (ref 65–99)

## 2016-04-08 LAB — RAPID URINE DRUG SCREEN, HOSP PERFORMED
AMPHETAMINES: NOT DETECTED
BENZODIAZEPINES: POSITIVE — AB
Barbiturates: NOT DETECTED
Cocaine: NOT DETECTED
Opiates: POSITIVE — AB
Tetrahydrocannabinol: POSITIVE — AB

## 2016-04-08 LAB — BASIC METABOLIC PANEL
ANION GAP: 13 (ref 5–15)
BUN: 6 mg/dL (ref 6–20)
CHLORIDE: 104 mmol/L (ref 101–111)
CO2: 24 mmol/L (ref 22–32)
Calcium: 8.9 mg/dL (ref 8.9–10.3)
Creatinine, Ser: 0.63 mg/dL (ref 0.44–1.00)
GFR calc non Af Amer: >60 mL/min (ref >60)
Glucose, Bld: 121 mg/dL — ABNORMAL HIGH (ref 65–99)
Potassium: 3.8 mmol/L (ref 3.5–5.1)
SODIUM: 141 mmol/L (ref 135–145)

## 2016-04-08 LAB — TYPE AND SCREEN
BLOOD PRODUCT EXPIRATION DATE: 201802092359
Blood Product Expiration Date: 201802062359
ISSUE DATE / TIME: 201801291033
ISSUE DATE / TIME: 201801291033
UNIT TYPE AND RH: 6200
Unit Type and Rh: 6200

## 2016-04-08 LAB — ABO/RH: ABO/RH(D): A POS

## 2016-04-08 LAB — PREPARE RBC (CROSSMATCH)

## 2016-04-08 LAB — MRSA PCR SCREENING: MRSA BY PCR: NEGATIVE

## 2016-04-08 SURGERY — POSTERIOR LUMBAR FUSION 4 LEVEL
Anesthesia: General | Site: Spine Thoracic | Laterality: Bilateral

## 2016-04-08 MED ORDER — DEXAMETHASONE SODIUM PHOSPHATE 10 MG/ML IJ SOLN
INTRAMUSCULAR | Status: DC | PRN
  Administered 2016-04-08: 10 mg via INTRAVENOUS

## 2016-04-08 MED ORDER — HYDROMORPHONE HCL 1 MG/ML IJ SOLN
INTRAMUSCULAR | Status: AC
  Administered 2016-04-08: 1 mg
  Filled 2016-04-08: qty 0.5

## 2016-04-08 MED ORDER — CEFAZOLIN SODIUM-DEXTROSE 2-4 GM/100ML-% IV SOLN
2.0000 g | INTRAVENOUS | Status: AC
  Administered 2016-04-08: 2 g via INTRAVENOUS
  Filled 2016-04-08: qty 100

## 2016-04-08 MED ORDER — BACITRACIN 50000 UNITS IM SOLR
INTRAMUSCULAR | Status: DC | PRN
  Administered 2016-04-08: 18:00:00

## 2016-04-08 MED ORDER — HYDROMORPHONE HCL 1 MG/ML IJ SOLN
INTRAMUSCULAR | Status: AC
  Administered 2016-04-08: 0.5 mg via INTRAVENOUS
  Filled 2016-04-08: qty 1

## 2016-04-08 MED ORDER — ALPRAZOLAM 0.5 MG PO TABS: 0.5000 mg | ORAL_TABLET | Freq: Two times a day (BID) | ORAL | Status: DC | PRN

## 2016-04-08 MED ORDER — LIDOCAINE 2% (20 MG/ML) 5 ML SYRINGE
INTRAMUSCULAR | Status: DC | PRN
  Administered 2016-04-08: 60 mg via INTRAVENOUS

## 2016-04-08 MED ORDER — PANTOPRAZOLE SODIUM 40 MG IV SOLR: 40.0000 mg | Freq: Every day | INTRAVENOUS | Status: DC

## 2016-04-08 MED ORDER — INSULIN ASPART 100 UNIT/ML ~~LOC~~ SOLN
0.0000 [IU] | Freq: Three times a day (TID) | SUBCUTANEOUS | Status: DC
  Administered 2016-04-08: 2 [IU] via SUBCUTANEOUS
  Administered 2016-04-09 – 2016-04-12 (×4): 1 [IU] via SUBCUTANEOUS

## 2016-04-08 MED ORDER — PNEUMOCOCCAL VAC POLYVALENT 25 MCG/0.5ML IJ INJ
0.5000 mL | INJECTION | INTRAMUSCULAR | Status: AC
  Administered 2016-04-10: 0.5 mL via INTRAMUSCULAR
  Filled 2016-04-08: qty 0.5

## 2016-04-08 MED ORDER — SODIUM CHLORIDE 0.9% FLUSH: 3.0000 mL | Freq: Two times a day (BID) | INTRAVENOUS | Status: DC

## 2016-04-08 MED ORDER — FENTANYL CITRATE (PF) 100 MCG/2ML IJ SOLN
INTRAMUSCULAR | Status: AC
  Filled 2016-04-08: qty 4

## 2016-04-08 MED ORDER — KETAMINE HCL 10 MG/ML IJ SOLN
INTRAMUSCULAR | Status: DC | PRN
  Administered 2016-04-08: 10 mg via INTRAVENOUS
  Administered 2016-04-08: 20 mg via INTRAVENOUS
  Administered 2016-04-08: 30 mg via INTRAVENOUS
  Administered 2016-04-08: 40 mg via INTRAVENOUS

## 2016-04-08 MED ORDER — ACETAMINOPHEN 325 MG PO TABS
650.0000 mg | ORAL_TABLET | ORAL | Status: DC | PRN
  Administered 2016-04-09 – 2016-04-14 (×6): 650 mg via ORAL
  Filled 2016-04-08 (×5): qty 2

## 2016-04-08 MED ORDER — PROPOFOL 10 MG/ML IV BOLUS
INTRAVENOUS | Status: DC | PRN
  Administered 2016-04-08: 200 mg via INTRAVENOUS

## 2016-04-08 MED ORDER — SODIUM CHLORIDE 0.9 % IV SOLN
250.0000 mL | INTRAVENOUS | Status: DC
  Administered 2016-04-08: 250 mL via INTRAVENOUS

## 2016-04-08 MED ORDER — THROMBIN 20000 UNITS EX SOLR
CUTANEOUS | Status: DC | PRN
  Administered 2016-04-08: 18:00:00 via TOPICAL

## 2016-04-08 MED ORDER — LEVOTHYROXINE SODIUM 88 MCG PO TABS
88.0000 ug | ORAL_TABLET | Freq: Every day | ORAL | Status: DC
  Administered 2016-04-09 – 2016-04-17 (×9): 88 ug via ORAL
  Filled 2016-04-08 (×9): qty 1

## 2016-04-08 MED ORDER — OXYCODONE HCL 5 MG PO TABS
5.0000 mg | ORAL_TABLET | ORAL | Status: DC | PRN
  Administered 2016-04-08 – 2016-04-10 (×9): 10 mg via ORAL
  Filled 2016-04-08 (×10): qty 2

## 2016-04-08 MED ORDER — MIDAZOLAM HCL 2 MG/2ML IJ SOLN
INTRAMUSCULAR | Status: AC
  Filled 2016-04-08: qty 2

## 2016-04-08 MED ORDER — DEXTROSE 5 % IV SOLN
INTRAVENOUS | Status: DC | PRN
  Administered 2016-04-08: 20 ug/min via INTRAVENOUS

## 2016-04-08 MED ORDER — MIDAZOLAM HCL 2 MG/2ML IJ SOLN
INTRAMUSCULAR | Status: DC | PRN
  Administered 2016-04-08: 2 mg via INTRAVENOUS

## 2016-04-08 MED ORDER — HYDROMORPHONE HCL 1 MG/ML IJ SOLN
0.5000 mg | INTRAMUSCULAR | Status: DC | PRN
  Administered 2016-04-08 (×3): 2 mg via INTRAVENOUS
  Filled 2016-04-08 (×3): qty 2

## 2016-04-08 MED ORDER — ROCURONIUM BROMIDE 50 MG/5ML IV SOSY
PREFILLED_SYRINGE | INTRAVENOUS | Status: AC
  Filled 2016-04-08: qty 5

## 2016-04-08 MED ORDER — PANTOPRAZOLE SODIUM 40 MG PO TBEC
40.0000 mg | DELAYED_RELEASE_TABLET | Freq: Every day | ORAL | Status: DC
  Administered 2016-04-09 – 2016-04-17 (×9): 40 mg via ORAL
  Filled 2016-04-08 (×9): qty 1

## 2016-04-08 MED ORDER — BUPIVACAINE HCL (PF) 0.25 % IJ SOLN
INTRAMUSCULAR | Status: AC
  Filled 2016-04-08: qty 30

## 2016-04-08 MED ORDER — HYDROMORPHONE 1 MG/ML IV SOLN
INTRAVENOUS | Status: DC
  Administered 2016-04-08: 25 mg via INTRAVENOUS
  Administered 2016-04-09: 5.1 mg via INTRAVENOUS
  Administered 2016-04-09: 6.6 mg via INTRAVENOUS
  Administered 2016-04-09: 2.4 mg via INTRAVENOUS
  Administered 2016-04-09: 16:00:00 via INTRAVENOUS
  Administered 2016-04-09: 1.8 mg via INTRAVENOUS
  Administered 2016-04-09: 4.5 mg via INTRAVENOUS
  Administered 2016-04-10: 2.7 mg via INTRAVENOUS
  Administered 2016-04-10: 6 mg via INTRAVENOUS
  Administered 2016-04-10: 4.2 mg via INTRAVENOUS
  Administered 2016-04-10: 3 mg via INTRAVENOUS
  Filled 2016-04-08 (×3): qty 25

## 2016-04-08 MED ORDER — MENTHOL 3 MG MT LOZG: 1.0000 | LOZENGE | OROMUCOSAL | Status: DC | PRN

## 2016-04-08 MED ORDER — PHENOL 1.4 % MT LIQD
1.0000 | OROMUCOSAL | Status: DC | PRN
Start: 2016-04-08 — End: 2016-04-17

## 2016-04-08 MED ORDER — PHENYLEPHRINE 40 MCG/ML (10ML) SYRINGE FOR IV PUSH (FOR BLOOD PRESSURE SUPPORT)
PREFILLED_SYRINGE | INTRAVENOUS | Status: DC | PRN
  Administered 2016-04-08: 80 ug via INTRAVENOUS

## 2016-04-08 MED ORDER — SCOPOLAMINE 1 MG/3DAYS TD PT72
MEDICATED_PATCH | TRANSDERMAL | Status: AC
  Filled 2016-04-08: qty 1

## 2016-04-08 MED ORDER — DIPHENHYDRAMINE HCL 50 MG/ML IJ SOLN: 12.5000 mg | Freq: Four times a day (QID) | INTRAMUSCULAR | Status: DC | PRN

## 2016-04-08 MED ORDER — FENTANYL CITRATE (PF) 100 MCG/2ML IJ SOLN
INTRAMUSCULAR | Status: DC | PRN
  Administered 2016-04-08: 200 ug via INTRAVENOUS
  Administered 2016-04-08 (×2): 50 ug via INTRAVENOUS

## 2016-04-08 MED ORDER — CEFAZOLIN IN D5W 1 GM/50ML IV SOLN
1.0000 g | Freq: Three times a day (TID) | INTRAVENOUS | Status: AC
  Administered 2016-04-08 – 2016-04-09 (×2): 1 g via INTRAVENOUS
  Filled 2016-04-08 (×2): qty 50

## 2016-04-08 MED ORDER — FENTANYL CITRATE (PF) 100 MCG/2ML IJ SOLN
INTRAMUSCULAR | Status: AC
  Filled 2016-04-08: qty 2

## 2016-04-08 MED ORDER — LORAZEPAM 2 MG/ML IJ SOLN
1.0000 mg | Freq: Four times a day (QID) | INTRAMUSCULAR | Status: DC | PRN
  Administered 2016-04-10 (×2): 1 mg via INTRAVENOUS
  Filled 2016-04-08: qty 1

## 2016-04-08 MED ORDER — NICOTINE 14 MG/24HR TD PT24
14.0000 mg | MEDICATED_PATCH | Freq: Every day | TRANSDERMAL | Status: DC
  Administered 2016-04-08 – 2016-04-17 (×10): 14 mg via TRANSDERMAL
  Filled 2016-04-08 (×10): qty 1

## 2016-04-08 MED ORDER — VECURONIUM BROMIDE 10 MG IV SOLR
INTRAVENOUS | Status: DC | PRN
  Administered 2016-04-08 (×2): 2 mg via INTRAVENOUS

## 2016-04-08 MED ORDER — LACTATED RINGERS IV SOLN
INTRAVENOUS | Status: DC
  Administered 2016-04-08 (×3): via INTRAVENOUS

## 2016-04-08 MED ORDER — SUGAMMADEX SODIUM 200 MG/2ML IV SOLN
INTRAVENOUS | Status: DC | PRN
  Administered 2016-04-08: 175 mg via INTRAVENOUS

## 2016-04-08 MED ORDER — ONDANSETRON HCL 4 MG/2ML IJ SOLN: 4.0000 mg | Freq: Four times a day (QID) | INTRAMUSCULAR | Status: DC | PRN

## 2016-04-08 MED ORDER — THROMBIN 20000 UNITS EX SOLR
CUTANEOUS | Status: AC
  Filled 2016-04-08: qty 20000

## 2016-04-08 MED ORDER — DOCUSATE SODIUM 100 MG PO CAPS
100.0000 mg | ORAL_CAPSULE | Freq: Two times a day (BID) | ORAL | Status: DC
  Administered 2016-04-09 – 2016-04-17 (×16): 100 mg via ORAL
  Filled 2016-04-08 (×17): qty 1

## 2016-04-08 MED ORDER — NALOXONE HCL 0.4 MG/ML IJ SOLN: 0.4000 mg | INTRAMUSCULAR | Status: DC | PRN

## 2016-04-08 MED ORDER — SUGAMMADEX SODIUM 200 MG/2ML IV SOLN
INTRAVENOUS | Status: AC
  Filled 2016-04-08: qty 2

## 2016-04-08 MED ORDER — PHENYLEPHRINE 40 MCG/ML (10ML) SYRINGE FOR IV PUSH (FOR BLOOD PRESSURE SUPPORT)
PREFILLED_SYRINGE | INTRAVENOUS | Status: AC
  Filled 2016-04-08: qty 10

## 2016-04-08 MED ORDER — CITALOPRAM HYDROBROMIDE 40 MG PO TABS
40.0000 mg | ORAL_TABLET | Freq: Every day | ORAL | Status: DC
  Administered 2016-04-09 – 2016-04-17 (×9): 40 mg via ORAL
  Filled 2016-04-08: qty 1
  Filled 2016-04-08: qty 4
  Filled 2016-04-08 (×7): qty 1

## 2016-04-08 MED ORDER — ONDANSETRON HCL 4 MG/2ML IJ SOLN
INTRAMUSCULAR | Status: AC
  Filled 2016-04-08: qty 4

## 2016-04-08 MED ORDER — DIPHENHYDRAMINE HCL 12.5 MG/5ML PO ELIX
12.5000 mg | ORAL_SOLUTION | Freq: Four times a day (QID) | ORAL | Status: DC | PRN
  Administered 2016-04-16: 12.5 mg via ORAL
  Filled 2016-04-08: qty 10

## 2016-04-08 MED ORDER — VANCOMYCIN HCL 1000 MG IV SOLR
INTRAVENOUS | Status: AC
  Filled 2016-04-08: qty 1000

## 2016-04-08 MED ORDER — 0.9 % SODIUM CHLORIDE (POUR BTL) OPTIME
TOPICAL | Status: DC | PRN
  Administered 2016-04-08: 1000 mL

## 2016-04-08 MED ORDER — ONDANSETRON HCL 4 MG PO TABS
4.0000 mg | ORAL_TABLET | Freq: Four times a day (QID) | ORAL | Status: DC | PRN
  Administered 2016-04-10: 4 mg via ORAL
  Filled 2016-04-08: qty 1

## 2016-04-08 MED ORDER — SODIUM CHLORIDE 0.9% FLUSH: 9.0000 mL | INTRAVENOUS | Status: DC | PRN

## 2016-04-08 MED ORDER — PROMETHAZINE HCL 25 MG/ML IJ SOLN: 6.2500 mg | INTRAMUSCULAR | Status: DC | PRN

## 2016-04-08 MED ORDER — CEFAZOLIN SODIUM-DEXTROSE 2-4 GM/100ML-% IV SOLN: 2.0000 g | INTRAVENOUS | Status: DC

## 2016-04-08 MED ORDER — BISACODYL 10 MG RE SUPP
10.0000 mg | Freq: Every day | RECTAL | Status: DC | PRN
  Administered 2016-04-11: 10 mg via RECTAL
  Filled 2016-04-08: qty 1

## 2016-04-08 MED ORDER — BUPIVACAINE HCL (PF) 0.25 % IJ SOLN
INTRAMUSCULAR | Status: DC | PRN
  Administered 2016-04-08: 30 mL

## 2016-04-08 MED ORDER — LIDOCAINE 2% (20 MG/ML) 5 ML SYRINGE
INTRAMUSCULAR | Status: AC
  Filled 2016-04-08: qty 15

## 2016-04-08 MED ORDER — HYDROMORPHONE HCL 1 MG/ML IJ SOLN
0.5000 mg | Freq: Once | INTRAMUSCULAR | Status: AC
  Administered 2016-04-08: 0.5 mg via INTRAVENOUS

## 2016-04-08 MED ORDER — PROPOFOL 10 MG/ML IV BOLUS
INTRAVENOUS | Status: AC
  Filled 2016-04-08: qty 40

## 2016-04-08 MED ORDER — DEXAMETHASONE SODIUM PHOSPHATE 10 MG/ML IJ SOLN
INTRAMUSCULAR | Status: AC
  Filled 2016-04-08: qty 3

## 2016-04-08 MED ORDER — HYDROMORPHONE HCL 1 MG/ML IJ SOLN
0.2500 mg | INTRAMUSCULAR | Status: DC | PRN
  Administered 2016-04-08 (×2): 0.5 mg via INTRAVENOUS

## 2016-04-08 MED ORDER — SCOPOLAMINE 1 MG/3DAYS TD PT72
1.0000 | MEDICATED_PATCH | TRANSDERMAL | Status: DC
  Administered 2016-04-08: 1.5 mg via TRANSDERMAL

## 2016-04-08 MED ORDER — SODIUM CHLORIDE 0.9% FLUSH: 3.0000 mL | INTRAVENOUS | Status: DC | PRN

## 2016-04-08 MED ORDER — ROCURONIUM BROMIDE 100 MG/10ML IV SOLN
INTRAVENOUS | Status: DC | PRN
  Administered 2016-04-08: 50 mg via INTRAVENOUS

## 2016-04-08 MED ORDER — HYDROMORPHONE HCL 1 MG/ML IJ SOLN
INTRAMUSCULAR | Status: AC
  Administered 2016-04-08: 0.5 mg via INTRAVENOUS
  Filled 2016-04-08: qty 0.5

## 2016-04-08 MED ORDER — KETAMINE HCL-SODIUM CHLORIDE 100-0.9 MG/10ML-% IV SOSY
PREFILLED_SYRINGE | INTRAVENOUS | Status: AC
  Filled 2016-04-08: qty 10

## 2016-04-08 MED ORDER — VANCOMYCIN HCL 1000 MG IV SOLR
INTRAVENOUS | Status: DC | PRN
  Administered 2016-04-08: 1000 mg via TOPICAL

## 2016-04-08 MED ORDER — SODIUM CHLORIDE 0.9 % IV BOLUS (SEPSIS)
1000.0000 mL | Freq: Once | INTRAVENOUS | Status: AC
  Administered 2016-04-08: 1000 mL via INTRAVENOUS

## 2016-04-08 MED ORDER — METHOCARBAMOL 1000 MG/10ML IJ SOLN
1000.0000 mg | Freq: Three times a day (TID) | INTRAMUSCULAR | Status: DC | PRN
  Administered 2016-04-08 – 2016-04-10 (×3): 1000 mg via INTRAVENOUS
  Filled 2016-04-08 (×7): qty 10

## 2016-04-08 SURGICAL SUPPLY — 63 items
BAG DECANTER FOR FLEXI CONT (MISCELLANEOUS) ×3 IMPLANT
BENZOIN TINCTURE PRP APPL 2/3 (GAUZE/BANDAGES/DRESSINGS) ×3 IMPLANT
BLADE CLIPPER SURG (BLADE) IMPLANT
BUR CUTTER 7.0 ROUND (BURR) IMPLANT
BUR MATCHSTICK NEURO 3.0 LAGG (BURR) ×3 IMPLANT
CANISTER SUCT 3000ML PPV (MISCELLANEOUS) ×3 IMPLANT
CAP LCK SPNE (Orthopedic Implant) ×8 IMPLANT
CAP LOCK SPINE RADIUS (Orthopedic Implant) ×8 IMPLANT
CAP LOCKING (Orthopedic Implant) ×16 IMPLANT
CARTRIDGE OIL MAESTRO DRILL (MISCELLANEOUS) ×1 IMPLANT
CLOSURE WOUND 1/2 X4 (GAUZE/BANDAGES/DRESSINGS) ×1
CONT SPEC 4OZ CLIKSEAL STRL BL (MISCELLANEOUS) ×3 IMPLANT
COVER BACK TABLE 60X90IN (DRAPES) ×3 IMPLANT
CROSSLINK MEDIUM (Orthopedic Implant) ×3 IMPLANT
DECANTER SPIKE VIAL GLASS SM (MISCELLANEOUS) ×3 IMPLANT
DERMABOND ADVANCED (GAUZE/BANDAGES/DRESSINGS) ×2
DERMABOND ADVANCED .7 DNX12 (GAUZE/BANDAGES/DRESSINGS) ×1 IMPLANT
DIFFUSER DRILL AIR PNEUMATIC (MISCELLANEOUS) ×3 IMPLANT
DRAPE C-ARM 42X72 X-RAY (DRAPES) ×6 IMPLANT
DRAPE HALF SHEET 40X57 (DRAPES) IMPLANT
DRAPE LAPAROTOMY 100X72X124 (DRAPES) ×3 IMPLANT
DRAPE POUCH INSTRU U-SHP 10X18 (DRAPES) ×3 IMPLANT
DRAPE SURG 17X23 STRL (DRAPES) ×12 IMPLANT
DRSG OPSITE POSTOP 4X10 (GAUZE/BANDAGES/DRESSINGS) ×3 IMPLANT
DURAPREP 26ML APPLICATOR (WOUND CARE) ×3 IMPLANT
ELECT REM PT RETURN 9FT ADLT (ELECTROSURGICAL) ×3
ELECTRODE REM PT RTRN 9FT ADLT (ELECTROSURGICAL) ×1 IMPLANT
EVACUATOR 1/8 PVC DRAIN (DRAIN) ×3 IMPLANT
GAUZE SPONGE 4X4 12PLY STRL (GAUZE/BANDAGES/DRESSINGS) ×3 IMPLANT
GLOVE BIO SURGEON STRL SZ 6.5 (GLOVE) ×6 IMPLANT
GLOVE BIO SURGEON STRL SZ8 (GLOVE) ×3 IMPLANT
GLOVE BIO SURGEON STRL SZ8.5 (GLOVE) ×3 IMPLANT
GLOVE BIO SURGEONS STRL SZ 6.5 (GLOVE) ×3
GLOVE BIOGEL PI IND STRL 8.5 (GLOVE) ×1 IMPLANT
GLOVE BIOGEL PI INDICATOR 8.5 (GLOVE) ×2
GLOVE ECLIPSE 9.0 STRL (GLOVE) ×9 IMPLANT
GOWN STRL REUS W/ TWL LRG LVL3 (GOWN DISPOSABLE) ×2 IMPLANT
GOWN STRL REUS W/ TWL XL LVL3 (GOWN DISPOSABLE) ×4 IMPLANT
GOWN STRL REUS W/TWL 2XL LVL3 (GOWN DISPOSABLE) IMPLANT
GOWN STRL REUS W/TWL LRG LVL3 (GOWN DISPOSABLE) ×4
GOWN STRL REUS W/TWL XL LVL3 (GOWN DISPOSABLE) ×8
GRAFT BN 10X1XDBM MAGNIFUSE (Bone Implant) ×1 IMPLANT
GRAFT BONE MAGNIFUSE 1X10 SNGL (Bone Implant) ×6 IMPLANT
GRAFT BONE MAGNIFUSE 1X10CM (Bone Implant) ×2 IMPLANT
KIT BASIN OR (CUSTOM PROCEDURE TRAY) ×3 IMPLANT
KIT ROOM TURNOVER OR (KITS) ×3 IMPLANT
NEEDLE HYPO 22GX1.5 SAFETY (NEEDLE) ×3 IMPLANT
NS IRRIG 1000ML POUR BTL (IV SOLUTION) ×3 IMPLANT
OIL CARTRIDGE MAESTRO DRILL (MISCELLANEOUS) ×3
PACK LAMINECTOMY NEURO (CUSTOM PROCEDURE TRAY) ×3 IMPLANT
ROD 160MM (Rod) ×6 IMPLANT
SCREW 5.75X40M (Screw) ×12 IMPLANT
SCREW 5.75X45MM (Screw) ×12 IMPLANT
SPONGE SURGIFOAM ABS GEL 100 (HEMOSTASIS) ×3 IMPLANT
STRIP CLOSURE SKIN 1/2X4 (GAUZE/BANDAGES/DRESSINGS) ×2 IMPLANT
SUT VIC AB 0 CT1 18XCR BRD8 (SUTURE) ×2 IMPLANT
SUT VIC AB 0 CT1 8-18 (SUTURE) ×4
SUT VIC AB 2-0 CT1 18 (SUTURE) ×6 IMPLANT
SUT VIC AB 3-0 SH 8-18 (SUTURE) ×6 IMPLANT
TOWEL OR 17X24 6PK STRL BLUE (TOWEL DISPOSABLE) ×3 IMPLANT
TOWEL OR 17X26 10 PK STRL BLUE (TOWEL DISPOSABLE) ×3 IMPLANT
TRAY FOLEY W/METER SILVER 16FR (SET/KITS/TRAYS/PACK) ×3 IMPLANT
WATER STERILE IRR 1000ML POUR (IV SOLUTION) ×3 IMPLANT

## 2016-04-08 NOTE — ED Notes (Signed)
Consent signed and at bedside  

## 2016-04-08 NOTE — Transfer of Care (Signed)
Immediate Anesthesia Transfer of Care Note  Patient: [name]  Procedure(s) Performed: Procedure(s): Thoracic Ten-Lumbar Three Posterior Lateral Arthrodesis with segmental pedicle screw fixation, Lumbar One transpedicular decompression (Bilateral)  Patient Location: PACU  Anesthesia Type:General  Level of Consciousness: awake and patient cooperative  Airway & Oxygen Therapy: Patient Spontanous Breathing and Patient connected to nasal cannula oxygen  Post-op Assessment: Report given to RN, Post -op Vital signs reviewed and stable and not attempting to move lower extremities  Post vital signs: Reviewed and stable  Last Vitals:  Vitals:   04/08/16 1500 04/08/16 2005  BP: (!) 102/59   Pulse: 73   Resp: 19   Temp: 37.3 C (P) 36.3 C    Last Pain:  Vitals:   04/08/16 1629  TempSrc:   PainSc: 7       Patients Stated Pain Goal: 2 ([id number])  Complications: No apparent anesthesia complications
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
## 2016-04-08 NOTE — Progress Notes (Signed)
Orthopedic Tech Progress Note Patient Details:  [name] [birth year] [id number]  [name], female   DOB: 10/24/1980, 36 y.o.   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 04/08/2016, 9:04 AM
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
## 2016-04-08 NOTE — ED Notes (Signed)
Updated pt's brother and mother on plan of care per pt's request
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
## 2016-04-08 NOTE — Progress Notes (Signed)
Pre op CHG bath done- pt refused wipe for back. Does not want to turn for wiping back d/t pain. Short stay and OR aware. Plan on completing full bath in OR.  Dr. Grandville Silos on unit as pt leaving with transport. Order given for pt to transport off monitor. PCA disconnected and in patient room. Will restart post op if order continued.

## 2016-04-08 NOTE — Anesthesia Postprocedure Evaluation (Signed)
Anesthesia Post Note  Patient: [name]  Procedure(s) Performed: Procedure(s) (LRB): Thoracic Ten-Lumbar Three Posterior Lateral Arthrodesis with segmental pedicle screw fixation, Lumbar One transpedicular decompression (Bilateral)  Patient location during evaluation: PACU Anesthesia Type: General Level of consciousness: awake Pain management: pain level controlled Vital Signs Assessment: post-procedure vital signs reviewed and stable Respiratory status: spontaneous breathing Cardiovascular status: stable Anesthetic complications: no       Last Vitals:  Vitals:   04/08/16 2130 04/08/16 2145  BP: (!) 75/50 (!) 91/57  Pulse: 88 83  Resp: 16 17  Temp: 37.2 C 37.3 C    Last Pain:  Vitals:   04/08/16 2114  TempSrc:   PainSc: 8                  Perley Arthurs

## 2016-04-08 NOTE — Anesthesia Procedure Notes (Signed)
Procedure Name: Intubation Date/Time: 04/08/2016 5:04 PM Performed by: Trixie Deis A Pre-anesthesia Checklist: Patient identified, Emergency Drugs available, Suction available and Patient being monitored Patient Re-evaluated:Patient Re-evaluated prior to inductionOxygen Delivery Method: Circle System Utilized Preoxygenation: Pre-oxygenation with 100% oxygen Intubation Type: IV induction Ventilation: Mask ventilation without difficulty Laryngoscope Size: Mac and 3 Grade View: Grade I Tube type: Oral Tube size: 7.0 mm Number of attempts: 1 Airway Equipment and Method: Stylet and Oral airway Placement Confirmation: ETT inserted through vocal cords under direct vision,  positive ETCO2 and breath sounds checked- equal and bilateral Secured at: 22 cm Tube secured with: Tape Dental Injury: Teeth and Oropharynx as per pre-operative assessment

## 2016-04-09 DIAGNOSIS — S32012A Unstable burst fracture of first lumbar vertebra, initial encounter for closed fracture: Secondary | ICD-10-CM

## 2016-04-09 DIAGNOSIS — N319 Neuromuscular dysfunction of bladder, unspecified: Secondary | ICD-10-CM

## 2016-04-09 DIAGNOSIS — N312 Flaccid neuropathic bladder, not elsewhere classified: Secondary | ICD-10-CM | POA: Diagnosis present

## 2016-04-09 DIAGNOSIS — T1490XS Injury, unspecified, sequela: Secondary | ICD-10-CM | POA: Diagnosis present

## 2016-04-09 DIAGNOSIS — K592 Neurogenic bowel, not elsewhere classified: Secondary | ICD-10-CM

## 2016-04-09 DIAGNOSIS — G8220 Paraplegia, unspecified: Secondary | ICD-10-CM | POA: Diagnosis present

## 2016-04-09 LAB — GLUCOSE, CAPILLARY
GLUCOSE-CAPILLARY: 126 mg/dL — AB (ref 65–99)
GLUCOSE-CAPILLARY: 107 mg/dL — AB (ref 65–99)
GLUCOSE-CAPILLARY: 104 mg/dL — AB (ref 65–99)
GLUCOSE-CAPILLARY: 107 mg/dL — AB (ref 65–99)

## 2016-04-09 NOTE — Progress Notes (Signed)
Orthopedic Tech Progress Note Patient Details:  [name] Aug 15, 1980 [id number]  [name], female   DOB: [birth year], 36 y.o.   MRN: [id number]   [name] 04/09/2016, 9:34 AM Called in bio-tech brace order; spoke with Judeen Hammans
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
## 2016-04-09 NOTE — Progress Notes (Signed)
Postop day 1. Patient overall doing reasonably well. Pain control difficult. No radicular pain. Patient still complains of sensory loss and weakness in both legs.  Slightly hypertensive but heart rate normal. Patient awake and alert. She is oriented and reasonably appropriate. Upper extremity strength normal. Lower extremity strength unchanged. Flicker muscle contraction in her quadriceps and gastrocnemius on the right. Patient still with some deep pressure and pain sensation in both anterior thighs but no sensation distal to knees. Dressing clean and dry.  Status post lumbar decompression with subsequent thoracic and lumbar fusion. Begin mobilization. Patient will certainly need prolonged rehabilitation for optimal recovery.

## 2016-04-09 NOTE — Progress Notes (Addendum)
I met with pt at bedside to discuss a potential inpt rehab admission. 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 She states her Mom is staying with her brother and with his family at this time. Patient has a wheelchair accessible home and aware she will need physical assist at a wheelchair level at d/c likely. She has given me permission to contact her brother, Erlene Quan, to discuss her rehab and care needs. 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 I await PT and OT evaluations so that I can begin discussions with pt and family for eventual disposition. [phone]
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
## 2016-04-10 ENCOUNTER — Encounter (HOSPITAL_COMMUNITY): Payer: Self-pay | Admitting: Neurosurgery

## 2016-04-10 LAB — GLUCOSE, CAPILLARY
GLUCOSE-CAPILLARY: 116 mg/dL — AB (ref 65–99)
GLUCOSE-CAPILLARY: 109 mg/dL — AB (ref 65–99)
GLUCOSE-CAPILLARY: 117 mg/dL — AB (ref 65–99)
Glucose-Capillary: 122 mg/dL — ABNORMAL HIGH (ref 65–99)

## 2016-04-10 MED ORDER — HYDROMORPHONE HCL 1 MG/ML IJ SOLN
1.0000 mg | INTRAMUSCULAR | Status: DC | PRN
  Administered 2016-04-10: 1 mg via INTRAVENOUS
  Administered 2016-04-11 (×3): 2 mg via INTRAVENOUS
  Administered 2016-04-11 – 2016-04-12 (×5): 1 mg via INTRAVENOUS
  Filled 2016-04-10 (×2): qty 1
  Filled 2016-04-10: qty 2
  Filled 2016-04-10 (×3): qty 1
  Filled 2016-04-10 (×2): qty 2
  Filled 2016-04-10: qty 1

## 2016-04-10 MED ORDER — OXYCODONE HCL 5 MG PO TABS
10.0000 mg | ORAL_TABLET | ORAL | Status: DC | PRN
  Administered 2016-04-10: 10 mg via ORAL
  Administered 2016-04-10: 20 mg via ORAL
  Administered 2016-04-10: 10 mg via ORAL
  Administered 2016-04-11 – 2016-04-12 (×6): 20 mg via ORAL
  Administered 2016-04-12: 10 mg via ORAL
  Administered 2016-04-12 – 2016-04-16 (×22): 20 mg via ORAL
  Administered 2016-04-17: 15 mg via ORAL
  Administered 2016-04-17: 10 mg via ORAL
  Administered 2016-04-17: 20 mg via ORAL
  Administered 2016-04-17: 10 mg via ORAL
  Filled 2016-04-10 (×6): qty 4
  Filled 2016-04-10: qty 2
  Filled 2016-04-10 (×16): qty 4
  Filled 2016-04-10: qty 2
  Filled 2016-04-10 (×4): qty 4
  Filled 2016-04-10: qty 2
  Filled 2016-04-10: qty 3
  Filled 2016-04-10 (×5): qty 4

## 2016-04-10 MED ORDER — TRAMADOL HCL 50 MG PO TABS
50.0000 mg | ORAL_TABLET | Freq: Four times a day (QID) | ORAL | Status: DC
  Administered 2016-04-10 – 2016-04-12 (×8): 50 mg via ORAL
  Filled 2016-04-10 (×8): qty 1

## 2016-04-10 MED FILL — Heparin Sodium (Porcine) Inj 1000 Unit/ML: INTRAMUSCULAR | Qty: 30 | Status: AC

## 2016-04-10 MED FILL — Sodium Chloride IV Soln 0.9%: INTRAVENOUS | Qty: 2000 | Status: AC
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
## 2016-04-10 NOTE — Progress Notes (Signed)
Patient encouraged to use incentive spirometer, 500 ml achieved.

## 2016-04-10 NOTE — Progress Notes (Signed)
Postop day 2. Pain reasonably controlled. No change in neurologic function of lower extremities. Dressing clean and dry. Abdomen soft.  Continue efforts at mobilization.

## 2016-04-10 NOTE — Progress Notes (Signed)
Pt is having difficulty voiding, she has been in/out straight cath'ed twice today both times over 1000 ml was taken out. She says she has no sensation of needing to void only feels pressure at her bladder.

## 2016-04-10 NOTE — Progress Notes (Signed)
Patient encouraged to use incentive spirometer, little effort made at this time. Patient achieved 250 ml x 3.

## 2016-04-10 NOTE — Progress Notes (Addendum)
I met with pt at bedside to discuss her progress thus far. Pain limiting her at this time. I contacted her brother, Erlene Quan, with her permission. We discussed a possible inpt rehab admission with plans that pt likely would d/c home at wheelchair level and would need 24/7 assist at home. 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 Pt also states she has chronic back pain issues. Erlene Quan to discuss issues with his parents and then will contact me to discuss caregiver support  Or arrange for a meeting with me to discuss pt situation with family. I have requested RN for pt to receive ducolax suppository, if no BM since admission due to spinal cord injury. [phone]

## 2016-04-11 LAB — GLUCOSE, CAPILLARY
GLUCOSE-CAPILLARY: 90 mg/dL (ref 65–99)
GLUCOSE-CAPILLARY: 150 mg/dL — AB (ref 65–99)
Glucose-Capillary: 112 mg/dL — ABNORMAL HIGH (ref 65–99)
Glucose-Capillary: 122 mg/dL — ABNORMAL HIGH (ref 65–99)

## 2016-04-11 MED ORDER — BETHANECHOL CHLORIDE 10 MG PO TABS
10.0000 mg | ORAL_TABLET | Freq: Three times a day (TID) | ORAL | Status: DC
  Administered 2016-04-11 – 2016-04-17 (×18): 10 mg via ORAL
  Filled 2016-04-11 (×19): qty 1
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
## 2016-04-11 NOTE — Progress Notes (Signed)
Orthopedic Tech Progress Note Patient Details:  [name] [birth year] [id number]  Ortho Devices Type of Ortho Device:  (prafo boot) Ortho Device/Splint Location: bilateral Ortho Device/Splint Interventions: Application   [name] 04/11/2016, 3:36 PM

## 2016-04-11 NOTE — Progress Notes (Signed)
I have left a message for pt's brother, Erlene Quan, to call me to continue our discussion about rehab venue options and caregiver support once discharged home. I await call back. [phone]
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
## 2016-04-11 NOTE — Progress Notes (Signed)
Pt has not voided on her own since Foley catheter was removed over almost 24 hours ago. She has been in/out twice this evening. Pt is also bed fast refusing to attempt to ambulate. Will continue to monitor.

## 2016-04-12 LAB — GLUCOSE, CAPILLARY
GLUCOSE-CAPILLARY: 124 mg/dL — AB (ref 65–99)
GLUCOSE-CAPILLARY: 107 mg/dL — AB (ref 65–99)
Glucose-Capillary: 105 mg/dL — ABNORMAL HIGH (ref 65–99)
Glucose-Capillary: 110 mg/dL — ABNORMAL HIGH (ref 65–99)

## 2016-04-12 MED ORDER — HYDROMORPHONE HCL 1 MG/ML IJ SOLN
1.0000 mg | INTRAMUSCULAR | Status: DC | PRN
  Administered 2016-04-12 – 2016-04-17 (×17): 1 mg via INTRAVENOUS
  Filled 2016-04-12 (×19): qty 1

## 2016-04-12 MED ORDER — TRAMADOL HCL 50 MG PO TABS
100.0000 mg | ORAL_TABLET | Freq: Four times a day (QID) | ORAL | Status: DC
  Administered 2016-04-12 – 2016-04-17 (×21): 100 mg via ORAL
  Filled 2016-04-12 (×22): qty 2

## 2016-04-12 MED ORDER — METHOCARBAMOL 500 MG PO TABS
500.0000 mg | ORAL_TABLET | Freq: Four times a day (QID) | ORAL | Status: DC | PRN
  Administered 2016-04-12 – 2016-04-14 (×5): 500 mg via ORAL
  Filled 2016-04-12 (×5): qty 1
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
## 2016-04-12 NOTE — Progress Notes (Signed)
Pt seen and examined. No issues overnight.  EXAM: Temp:  [98.6 F (37 C)-99.7 F (37.6 C)] 98.9 F (37.2 C) (02/10 0535) Pulse Rate:  [78-95] 82 (02/10 0535) Resp:  [18-20] 20 (02/10 0535) BP: (107-116)/(57-66) 116/66 (02/10 0535) SpO2:  [96 %-99 %] 99 % (02/10 0535) Intake/Output      02/09 0701 - 02/10 0700 02/10 0701 - 02/11 0700   I.V. (mL/kg)     IV Piggyback     Total Intake(mL/kg)     Urine (mL/kg/hr) 1250 (0.6)    Total Output 1250     Net -1250           Awake and alert Trace movements in quads and gastrocs  Stable Continue current care Encouraged therapy

## 2016-04-12 NOTE — Progress Notes (Signed)
Pt is having tingling sensation in right lower extremity, still requesting pain medication as soon as it is available, will conntinue to round on patient hourly.
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
## 2016-04-13 LAB — GLUCOSE, CAPILLARY
GLUCOSE-CAPILLARY: 112 mg/dL — AB (ref 65–99)
GLUCOSE-CAPILLARY: 109 mg/dL — AB (ref 65–99)
GLUCOSE-CAPILLARY: 110 mg/dL — AB (ref 65–99)
Glucose-Capillary: 82 mg/dL (ref 65–99)
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
## 2016-04-13 NOTE — Progress Notes (Signed)
Pt seen and examined.  No issues overnight. She is without concerns.   EXAM: Temp:  [98.4 F (36.9 C)-99.6 F (37.6 C)] 98.4 F (36.9 C) (02/11 0620) Pulse Rate:  [72-93] 72 (02/11 0620) Resp:  [20] 20 (02/11 0139) BP: (105-124)/(54-73) 109/73 (02/11 0620) SpO2:  [94 %-97 %] 96 % (02/11 0620) Intake/Output      02/10 0701 - 02/11 0700 02/11 0701 - 02/12 0700   Urine (mL/kg/hr) 1850 (0.9)    Stool 0 (0)    Total Output 1850     Net -1850          Stool Occurrence 3 x     Awake and alert Oriented x3 Trace movements in quads and gastrocs CN intact.  Plan Stable Continue current care Continue therapy

## 2016-04-13 NOTE — Progress Notes (Signed)
Pt unable to void since removal of foley. Pt bladder scanned for 858ml. Dr. Georgette Dover notified and new order received for foley. Foley inserted with NT in 5C06. Peri-care performed prior to foley insertion. Delia Heady RN

## 2016-04-14 LAB — GLUCOSE, CAPILLARY
GLUCOSE-CAPILLARY: 100 mg/dL — AB (ref 65–99)
Glucose-Capillary: 92 mg/dL (ref 65–99)
Glucose-Capillary: 80 mg/dL (ref 65–99)
Glucose-Capillary: 112 mg/dL — ABNORMAL HIGH (ref 65–99)

## 2016-04-14 MED ORDER — ALPRAZOLAM 0.5 MG PO TABS
0.5000 mg | ORAL_TABLET | Freq: Two times a day (BID) | ORAL | Status: DC | PRN
Start: 2016-04-14 — End: 2016-04-17
  Administered 2016-04-14 – 2016-04-17 (×6): 0.5 mg via ORAL
  Filled 2016-04-14 (×6): qty 1

## 2016-04-14 MED ORDER — METHOCARBAMOL 500 MG PO TABS
1000.0000 mg | ORAL_TABLET | Freq: Four times a day (QID) | ORAL | Status: DC | PRN
  Administered 2016-04-14 – 2016-04-17 (×6): 1000 mg via ORAL
  Filled 2016-04-14 (×6): qty 2

## 2016-04-14 MED ORDER — ENOXAPARIN SODIUM 40 MG/0.4ML ~~LOC~~ SOLN
40.0000 mg | SUBCUTANEOUS | Status: DC
  Administered 2016-04-14 – 2016-04-17 (×4): 40 mg via SUBCUTANEOUS
  Filled 2016-04-14 (×4): qty 0.4
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
## 2016-04-14 NOTE — Clinical Social Work Placement (Signed)
   CLINICAL SOCIAL WORK PLACEMENT  NOTE  Date:  04/14/2016  Patient Details  Name: [name] MRN: [id number] Date of Birth: 1980-06-20  Clinical Social Work is seeking post-discharge placement for this patient at the [place] level of care (*CSW will initial, date and re-position this form in  chart as items are completed):  Yes   Patient/family provided with [facility] Work Department's list of facilities offering this level of care within the geographic area requested by the patient (or if unable, by the patient's family).  Yes   Patient/family informed of their freedom to choose among providers that offer the needed level of care, that participate in Medicare, Medicaid or managed care program needed by the patient, have an available bed and are willing to accept the patient.  Yes   Patient/family informed of [facility]'s ownership interest in [facility] and [facility], as well as of the fact that they are under no obligation to receive care at these facilities.  PASRR submitted to EDS on 04/14/16     PASRR number received on 04/14/16     Existing PASRR number confirmed on       FL2 transmitted to all facilities in geographic area requested by pt/family on 04/14/16     FL2 transmitted to all facilities within larger geographic area on       Patient informed that his/her managed care company has contracts with or will negotiate with certain facilities, including the following:            Patient/family informed of bed offers received.  Patient chooses bed at       Physician recommends and patient chooses bed at      Patient to be transferred to   on  .  Patient to be transferred to facility by       Patient family notified on   of transfer.  Name of family member notified:        PHYSICIAN       Additional Comment:    _______________________________________________ Serafina Mitchell, [place] 04/14/2016, 3:52 PM
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
## 2016-04-14 NOTE — Clinical Social Work Note (Addendum)
Clinical Social Work Assessment  Patient Details  Name: [name] MRN: [id number] Date of Birth: 02-Jan-1981  Date of referral:  04/14/16               Reason for consult:  Discharge Planning, Trauma, Facility Placement                Permission sought to share information with:  Family Supports Permission granted to share information::  Yes, Verbal Permission Granted  Name::     Both Parents  Agency::     Relationship::     Contact Information:     Housing/Transportation Living arrangements for the past 2 months:  Single Family Home Source of Information:  Patient, Parent (Mom @ bedside) Patient Interpreter Needed:  None Criminal Activity/Legal Involvement Pertinent to Current Situation/Hospitalization:  No - Comment as needed Significant Relationships:  Parents Lives with:  Parents (Mom) Do you feel safe going back to the place where you live?  Yes Need for family participation in patient care:  Yes (Comment)  Care giving concerns:  Pt resides with her mother and prior to hospitalization she provided care for her mother. Pt's mother does not have the capacity to care for pt. Pt has no family that can provide 24 hr care for her at this time. Pt open to CIR and SNF.   Social Worker assessment / plan:  CSW met with pt and pt's mother at bedside to provide support and discuss DC planning. Pt pleasant, aox4. CSW discussed CIR and SNF, pt agreeable to both. CSW discussed SNF back-up in the event bed is not available for pt if accepted @ CIR. Pt is ok with this, reports that she just wants to walk again and she will do whatever it takes. CSW provided a list of SNF's so that pt/family could start looking at options as a back-up.  Pt becoming tearful when CSW attempted to discuss incident. It is unclear at this time if pt is experiencing nightmares or having flashbacks.  Prior to hospitalization pt was independent with all ADL's and ambulated with no assistive devices. Pt was the  caregiver for her mother at bedside and understands this will change. 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 Pt very optimistic she will recover and feels supported by both parents. CSW will  Remain available to coordinate DC plan for pt once pt is medically stable.  Employment status:  Disabled (Comment on whether or not currently receiving Disability) (SSI for dx Mentally Handicapp) Insurance information:  Medicaid [place] PT Recommendations:  [place], 24 Hour Supervision, Inpatient Rehab Consult Information / Referral to community resources:  [place], SBIRT  Patient/Family's Response to care: Pt and mother very appreciative of care received and CSW's involvement with DC planning.  Patient/Family's Understanding of and Emotional Response to Diagnosis, Current Treatment, and Prognosis:  Pt understands her prognosis and is very positive that she will overcome this setback/situation and walk again independently.  Emotional Assessment Appearance:  Appears stated age Attitude/Demeanor/Rapport:   (Pleasant) Affect (typically observed):  Accepting, Calm Orientation:  Oriented to Self, Oriented to Place, Oriented to  Time, Oriented to Situation Alcohol / Substance use:  Not Applicable Psych involvement (Current and /or in the community):  Outpatient Provider  Discharge Needs  Concerns to be addressed:  Home Safety Concerns, Discharge Planning Concerns Readmission within the last 30 days:  No Current discharge risk:  Physical Impairment Barriers to Discharge:  Continued Medical  Work up   [place], [place], [place] 04/14/2016, 3:02 PM

## 2016-04-15 ENCOUNTER — Inpatient Hospital Stay (HOSPITAL_COMMUNITY): Payer: Medicaid Other

## 2016-04-15 DIAGNOSIS — M79609 Pain in unspecified limb: Secondary | ICD-10-CM

## 2016-04-15 LAB — GLUCOSE, CAPILLARY
GLUCOSE-CAPILLARY: 90 mg/dL (ref 65–99)
GLUCOSE-CAPILLARY: 99 mg/dL (ref 65–99)
Glucose-Capillary: 86 mg/dL (ref 65–99)
Glucose-Capillary: 86 mg/dL (ref 65–99)

## 2016-04-15 LAB — BASIC METABOLIC PANEL
ANION GAP: 10 (ref 5–15)
BUN: 8 mg/dL (ref 6–20)
CHLORIDE: 98 mmol/L — AB (ref 101–111)
CO2: 28 mmol/L (ref 22–32)
Calcium: 8.5 mg/dL — ABNORMAL LOW (ref 8.9–10.3)
Creatinine, Ser: 0.55 mg/dL (ref 0.44–1.00)
GFR calc non Af Amer: >60 mL/min (ref >60)
GLUCOSE: 96 mg/dL (ref 65–99)
Potassium: 4.4 mmol/L (ref 3.5–5.1)
Sodium: 136 mmol/L (ref 135–145)

## 2016-04-15 MED ORDER — OXYCODONE HCL ER 10 MG PO T12A
10.0000 mg | EXTENDED_RELEASE_TABLET | Freq: Two times a day (BID) | ORAL | Status: DC
  Administered 2016-04-15 – 2016-04-17 (×5): 10 mg via ORAL
  Filled 2016-04-15 (×5): qty 1
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
## 2016-04-15 NOTE — Progress Notes (Signed)
Pt up in chair complaining of pain. RN aware and timing administration of meds when they are due. I spoke with Mom at bedside with pt and discussed that pt would have to be able to tolerate 4 hours of therapy per day with inpt rehab even if we admit her for two weeks to prepare her for d/c to SNF. I also discussed with pt and Mom that SNF would be where bed availability was and [place]. I will follow up tomorrow to assess pt's tolerance to participate with more therapy. [id number]

## 2016-04-15 NOTE — Progress Notes (Addendum)
*  PRELIMINARY RESULTS* Vascular Ultrasound Lower extremity venous duplex has been completed.  Preliminary findings: No evidence of DVT or baker's cyst.   Landry Mellow, RDMS, RVT  04/15/2016, 2:51 PM

## 2016-04-16 LAB — GLUCOSE, CAPILLARY
GLUCOSE-CAPILLARY: 88 mg/dL (ref 65–99)
GLUCOSE-CAPILLARY: 125 mg/dL — AB (ref 65–99)
GLUCOSE-CAPILLARY: 111 mg/dL — AB (ref 65–99)
Glucose-Capillary: 91 mg/dL (ref 65–99)
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
## 2016-04-16 NOTE — Progress Notes (Signed)
I met with pt at bedside to discuss her progress thus far. She states she does not feel she can do the 4 hrs of therapy per day required of her for admission to inpt rehab. She is in agreement to SNF rehab for her rehab venue. I have left a voicemail for her brother, Erlene Quan, or her Amado Nash to call me to discuss. I will alert RN CM and SW. [phone]

## 2016-04-17 DIAGNOSIS — S34101D Unspecified injury to L1 level of lumbar spinal cord, subsequent encounter: Secondary | ICD-10-CM | POA: Insufficient documentation

## 2016-04-17 LAB — GLUCOSE, CAPILLARY
GLUCOSE-CAPILLARY: 108 mg/dL — AB (ref 65–99)
GLUCOSE-CAPILLARY: 107 mg/dL — AB (ref 65–99)

## 2016-04-17 MED ORDER — ONDANSETRON HCL 4 MG PO TABS: 4.0000 mg | ORAL_TABLET | Freq: Four times a day (QID) | ORAL | 0 refills | Status: DC | PRN

## 2016-04-17 MED ORDER — OXYCODONE HCL ER 10 MG PO T12A: 10.0000 mg | EXTENDED_RELEASE_TABLET | Freq: Two times a day (BID) | ORAL | 0 refills | Status: DC

## 2016-04-17 MED ORDER — DOCUSATE SODIUM 100 MG PO CAPS: 100.0000 mg | ORAL_CAPSULE | Freq: Two times a day (BID) | ORAL | 0 refills | Status: DC

## 2016-04-17 MED ORDER — ACETAMINOPHEN 325 MG PO TABS: 650.0000 mg | ORAL_TABLET | ORAL | Status: DC | PRN

## 2016-04-17 MED ORDER — MENTHOL 3 MG MT LOZG: 1.0000 | LOZENGE | OROMUCOSAL | 12 refills | Status: DC | PRN

## 2016-04-17 MED ORDER — GABAPENTIN 400 MG PO CAPS: 400.0000 mg | ORAL_CAPSULE | Freq: Three times a day (TID) | ORAL | Status: DC

## 2016-04-17 MED ORDER — PANTOPRAZOLE SODIUM 40 MG PO TBEC: 40.0000 mg | DELAYED_RELEASE_TABLET | Freq: Every day | ORAL | Status: DC

## 2016-04-17 MED ORDER — INSULIN ASPART 100 UNIT/ML ~~LOC~~ SOLN: 0.0000 [IU] | Freq: Three times a day (TID) | SUBCUTANEOUS | 11 refills | Status: DC

## 2016-04-17 MED ORDER — NICOTINE 14 MG/24HR TD PT24: 14.0000 mg | MEDICATED_PATCH | Freq: Every day | TRANSDERMAL | 0 refills | Status: DC

## 2016-04-17 MED ORDER — DIPHENHYDRAMINE HCL 50 MG/ML IJ SOLN: 12.5000 mg | Freq: Four times a day (QID) | INTRAMUSCULAR | 0 refills | Status: DC | PRN

## 2016-04-17 MED ORDER — ENOXAPARIN SODIUM 40 MG/0.4ML ~~LOC~~ SOLN: 40.0000 mg | SUBCUTANEOUS | Status: DC

## 2016-04-17 MED ORDER — OXYCODONE HCL 10 MG PO TABS: 10.0000 mg | ORAL_TABLET | ORAL | 0 refills | Status: DC | PRN

## 2016-04-17 MED ORDER — METHOCARBAMOL 500 MG PO TABS: 1000.0000 mg | ORAL_TABLET | Freq: Four times a day (QID) | ORAL | Status: DC | PRN

## 2016-04-17 MED ORDER — GABAPENTIN 400 MG PO CAPS
400.0000 mg | ORAL_CAPSULE | Freq: Three times a day (TID) | ORAL | Status: DC
  Administered 2016-04-17: 400 mg via ORAL
  Filled 2016-04-17: qty 1

## 2016-04-17 MED ORDER — TRAMADOL HCL 50 MG PO TABS: 100.0000 mg | ORAL_TABLET | Freq: Four times a day (QID) | ORAL | 0 refills | Status: DC

## 2016-04-17 MED ORDER — BISACODYL 10 MG RE SUPP: 10.0000 mg | Freq: Every day | RECTAL | 0 refills | Status: DC | PRN

## 2016-04-17 NOTE — Care Management Note (Signed)
Case Management Note  Patient Details  Name: [name] MRN: [id number] Date of Birth: 03-24-1980  Subjective/Objective:    Pt medically stable for discharge today.  She is not able to tolerate therapy requirements for CIR, and will need SNF for rehab.                 Action/Plan: Plan dc to SNF today, per CSW arrangements.  Answered all of mother's questions with CSW present to the best of our abilities.  Letter given to mother to excuse pt from upcoming jury duty.    Expected Discharge Date:  04/17/16               Expected Discharge Plan:  Skilled Nursing Facility  In-House Referral:  Clinical Social Work  Discharge planning Services  CM Consult  Post Acute Care Choice:    Choice offered to:     DME Arranged:    DME Agency:     HH Arranged:    [place] Agency:     Status of Service:  Completed, signed off  If discussed at [employer], dates discussed:    Additional Comments:  Reinaldo Raddle, RN, BSN  Trauma/Neuro ICU Case Manager [phone]

## 2016-04-17 NOTE — Progress Notes (Signed)
No new issues. Pain control still a problem.  Wound looks good
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
## 2016-04-18 ENCOUNTER — Encounter: Payer: Self-pay | Admitting: Internal Medicine

## 2016-05-14 LAB — URINALYSIS, ROUTINE W REFLEX MICROSCOPIC
Bacteria, UA: NONE SEEN
Bilirubin Urine: NEGATIVE
GLUCOSE, UA: NEGATIVE mg/dL
HGB URINE DIPSTICK: NEGATIVE
Ketones, ur: NEGATIVE mg/dL
Leukocytes, UA: NEGATIVE
Nitrite: NEGATIVE
PH: 6 (ref 5.0–8.0)
Protein, ur: NEGATIVE mg/dL
SPECIFIC GRAVITY, URINE: 1.015 (ref 1.005–1.030)

## 2016-06-10 ENCOUNTER — Emergency Department (HOSPITAL_COMMUNITY): Payer: Medicaid Other

## 2016-06-10 ENCOUNTER — Inpatient Hospital Stay (HOSPITAL_COMMUNITY)
Admission: EM | Admit: 2016-06-10 | Discharge: 2016-06-16 | DRG: 853 | Disposition: A | Discharge status: Skilled Nursing Facility | Payer: Medicaid Other | Attending: Internal Medicine | Admitting: Internal Medicine

## 2016-06-10 ENCOUNTER — Encounter (HOSPITAL_COMMUNITY): Payer: Self-pay | Admitting: *Deleted

## 2016-06-10 DIAGNOSIS — N319 Neuromuscular dysfunction of bladder, unspecified: Secondary | ICD-10-CM | POA: Diagnosis present

## 2016-06-10 DIAGNOSIS — Z794 Long term (current) use of insulin: Secondary | ICD-10-CM | POA: Diagnosis not present

## 2016-06-10 DIAGNOSIS — G8220 Paraplegia, unspecified: Secondary | ICD-10-CM | POA: Diagnosis present

## 2016-06-10 DIAGNOSIS — T148XXA Other injury of unspecified body region, initial encounter: Secondary | ICD-10-CM

## 2016-06-10 DIAGNOSIS — Z7901 Long term (current) use of anticoagulants: Secondary | ICD-10-CM

## 2016-06-10 DIAGNOSIS — R6510 Systemic inflammatory response syndrome (SIRS) of non-infectious origin without acute organ dysfunction: Secondary | ICD-10-CM | POA: Diagnosis not present

## 2016-06-10 DIAGNOSIS — L89154 Pressure ulcer of sacral region, stage 4: Secondary | ICD-10-CM | POA: Diagnosis present

## 2016-06-10 DIAGNOSIS — M4628 Osteomyelitis of vertebra, sacral and sacrococcygeal region: Secondary | ICD-10-CM

## 2016-06-10 DIAGNOSIS — Z981 Arthrodesis status: Secondary | ICD-10-CM | POA: Diagnosis not present

## 2016-06-10 DIAGNOSIS — F329 Major depressive disorder, single episode, unspecified: Secondary | ICD-10-CM | POA: Diagnosis present

## 2016-06-10 DIAGNOSIS — L089 Local infection of the skin and subcutaneous tissue, unspecified: Secondary | ICD-10-CM | POA: Diagnosis not present

## 2016-06-10 DIAGNOSIS — F419 Anxiety disorder, unspecified: Secondary | ICD-10-CM | POA: Diagnosis present

## 2016-06-10 DIAGNOSIS — L89620 Pressure ulcer of left heel, unstageable: Secondary | ICD-10-CM | POA: Diagnosis present

## 2016-06-10 DIAGNOSIS — F339 Major depressive disorder, recurrent, unspecified: Secondary | ICD-10-CM | POA: Diagnosis present

## 2016-06-10 DIAGNOSIS — E039 Hypothyroidism, unspecified: Secondary | ICD-10-CM | POA: Diagnosis present

## 2016-06-10 DIAGNOSIS — N390 Urinary tract infection, site not specified: Secondary | ICD-10-CM | POA: Diagnosis present

## 2016-06-10 DIAGNOSIS — D638 Anemia in other chronic diseases classified elsewhere: Secondary | ICD-10-CM | POA: Diagnosis present

## 2016-06-10 DIAGNOSIS — E860 Dehydration: Secondary | ICD-10-CM | POA: Diagnosis present

## 2016-06-10 DIAGNOSIS — Z6836 Body mass index (BMI) 36.0-36.9, adult: Secondary | ICD-10-CM | POA: Diagnosis not present

## 2016-06-10 DIAGNOSIS — B951 Streptococcus, group B, as the cause of diseases classified elsewhere: Secondary | ICD-10-CM | POA: Diagnosis present

## 2016-06-10 DIAGNOSIS — Z79899 Other long term (current) drug therapy: Secondary | ICD-10-CM

## 2016-06-10 DIAGNOSIS — G8929 Other chronic pain: Secondary | ICD-10-CM | POA: Diagnosis present

## 2016-06-10 DIAGNOSIS — M25559 Pain in unspecified hip: Secondary | ICD-10-CM | POA: Diagnosis not present

## 2016-06-10 DIAGNOSIS — E871 Hypo-osmolality and hyponatremia: Secondary | ICD-10-CM | POA: Diagnosis present

## 2016-06-10 DIAGNOSIS — Z8249 Family history of ischemic heart disease and other diseases of the circulatory system: Secondary | ICD-10-CM

## 2016-06-10 DIAGNOSIS — L0390 Cellulitis, unspecified: Secondary | ICD-10-CM | POA: Diagnosis present

## 2016-06-10 DIAGNOSIS — L8945 Pressure ulcer of contiguous site of back, buttock and hip, unstageable: Secondary | ICD-10-CM | POA: Diagnosis not present

## 2016-06-10 DIAGNOSIS — M25561 Pain in right knee: Secondary | ICD-10-CM | POA: Diagnosis present

## 2016-06-10 DIAGNOSIS — F17210 Nicotine dependence, cigarettes, uncomplicated: Secondary | ICD-10-CM | POA: Diagnosis present

## 2016-06-10 DIAGNOSIS — E876 Hypokalemia: Secondary | ICD-10-CM | POA: Diagnosis present

## 2016-06-10 DIAGNOSIS — M869 Osteomyelitis, unspecified: Secondary | ICD-10-CM | POA: Diagnosis not present

## 2016-06-10 DIAGNOSIS — M25562 Pain in left knee: Secondary | ICD-10-CM | POA: Diagnosis present

## 2016-06-10 DIAGNOSIS — L8990 Pressure ulcer of unspecified site, unspecified stage: Secondary | ICD-10-CM | POA: Insufficient documentation

## 2016-06-10 DIAGNOSIS — Z9889 Other specified postprocedural states: Secondary | ICD-10-CM | POA: Diagnosis not present

## 2016-06-10 DIAGNOSIS — K592 Neurogenic bowel, not elsewhere classified: Secondary | ICD-10-CM | POA: Diagnosis present

## 2016-06-10 DIAGNOSIS — B9689 Other specified bacterial agents as the cause of diseases classified elsewhere: Secondary | ICD-10-CM | POA: Diagnosis not present

## 2016-06-10 DIAGNOSIS — Z79891 Long term (current) use of opiate analgesic: Secondary | ICD-10-CM

## 2016-06-10 DIAGNOSIS — D649 Anemia, unspecified: Secondary | ICD-10-CM | POA: Diagnosis not present

## 2016-06-10 DIAGNOSIS — T1490XS Injury, unspecified, sequela: Secondary | ICD-10-CM | POA: Diagnosis present

## 2016-06-10 DIAGNOSIS — A419 Sepsis, unspecified organism: Secondary | ICD-10-CM | POA: Diagnosis present

## 2016-06-10 DIAGNOSIS — F418 Other specified anxiety disorders: Secondary | ICD-10-CM | POA: Diagnosis present

## 2016-06-10 DIAGNOSIS — N312 Flaccid neuropathic bladder, not elsewhere classified: Secondary | ICD-10-CM | POA: Diagnosis not present

## 2016-06-10 DIAGNOSIS — B954 Other streptococcus as the cause of diseases classified elsewhere: Secondary | ICD-10-CM | POA: Diagnosis not present

## 2016-06-10 DIAGNOSIS — Z95828 Presence of other vascular implants and grafts: Secondary | ICD-10-CM | POA: Diagnosis not present

## 2016-06-10 DIAGNOSIS — L538 Other specified erythematous conditions: Secondary | ICD-10-CM | POA: Diagnosis present

## 2016-06-10 DIAGNOSIS — Z791 Long term (current) use of non-steroidal anti-inflammatories (NSAID): Secondary | ICD-10-CM

## 2016-06-10 DIAGNOSIS — F70 Mild intellectual disabilities: Secondary | ICD-10-CM | POA: Diagnosis present

## 2016-06-10 DIAGNOSIS — E559 Vitamin D deficiency, unspecified: Secondary | ICD-10-CM | POA: Diagnosis present

## 2016-06-10 DIAGNOSIS — L89159 Pressure ulcer of sacral region, unspecified stage: Secondary | ICD-10-CM | POA: Diagnosis present

## 2016-06-10 LAB — I-STAT CG4 LACTIC ACID, ED
Lactic Acid, Venous: 2.08 mmol/L (ref 0.5–1.9)
Lactic Acid, Venous: 1.34 mmol/L (ref 0.5–1.9)

## 2016-06-10 LAB — CBC WITH DIFFERENTIAL/PLATELET
BASOS PCT: 0 %
Basophils Absolute: 0 10*3/uL (ref 0.0–0.1)
EOS ABS: 0.4 10*3/uL (ref 0.0–0.7)
EOS PCT: 3 %
HCT: 30.5 % — ABNORMAL LOW (ref 36.0–46.0)
HEMOGLOBIN: 9.7 g/dL — AB (ref 12.0–15.0)
Lymphocytes Relative: 18 %
Lymphs Abs: 2.9 10*3/uL (ref 0.7–4.0)
MCH: 26.2 pg (ref 26.0–34.0)
MCHC: 31.8 g/dL (ref 30.0–36.0)
MCV: 82.4 fL (ref 78.0–100.0)
MONOS PCT: 8 %
Monocytes Absolute: 1.4 10*3/uL — ABNORMAL HIGH (ref 0.1–1.0)
NEUTROS PCT: 71 %
Neutro Abs: 12 10*3/uL — ABNORMAL HIGH (ref 1.7–7.7)
PLATELETS: 552 10*3/uL — AB (ref 150–400)
RBC: 3.7 MIL/uL — ABNORMAL LOW (ref 3.87–5.11)
RDW: 13.8 % (ref 11.5–15.5)
WBC: 16.7 10*3/uL — ABNORMAL HIGH (ref 4.0–10.5)

## 2016-06-10 LAB — URINALYSIS, ROUTINE W REFLEX MICROSCOPIC
Bilirubin Urine: NEGATIVE
GLUCOSE, UA: NEGATIVE mg/dL
Ketones, ur: NEGATIVE mg/dL
NITRITE: NEGATIVE
PH: 6 (ref 5.0–8.0)
Protein, ur: NEGATIVE mg/dL
SPECIFIC GRAVITY, URINE: 1.009 (ref 1.005–1.030)

## 2016-06-10 LAB — COMPREHENSIVE METABOLIC PANEL
ALK PHOS: 185 U/L — AB (ref 38–126)
ALT: 16 U/L (ref 14–54)
AST: 20 U/L (ref 15–41)
Albumin: 2.7 g/dL — ABNORMAL LOW (ref 3.5–5.0)
Anion gap: 10 (ref 5–15)
BUN: <5 mg/dL — ABNORMAL LOW (ref 6–20)
CALCIUM: 8.5 mg/dL — AB (ref 8.9–10.3)
CO2: 29 mmol/L (ref 22–32)
CREATININE: 0.42 mg/dL — AB (ref 0.44–1.00)
Chloride: 93 mmol/L — ABNORMAL LOW (ref 101–111)
Glucose, Bld: 101 mg/dL — ABNORMAL HIGH (ref 65–99)
Potassium: 3.5 mmol/L (ref 3.5–5.1)
SODIUM: 132 mmol/L — AB (ref 135–145)
Total Bilirubin: 0.5 mg/dL (ref 0.3–1.2)
Total Protein: 7.1 g/dL (ref 6.5–8.1)

## 2016-06-10 LAB — RETICULOCYTES
RBC.: 3.42 MIL/uL — AB (ref 3.87–5.11)
RETIC COUNT ABSOLUTE: 41 10*3/uL (ref 19.0–186.0)
Retic Ct Pct: 1.2 % (ref 0.4–3.1)

## 2016-06-10 LAB — APTT: aPTT: 30 seconds (ref 24–36)

## 2016-06-10 LAB — GLUCOSE, CAPILLARY: Glucose-Capillary: 97 mg/dL (ref 65–99)

## 2016-06-10 LAB — TYPE AND SCREEN
ABO/RH(D): A POS
ANTIBODY SCREEN: NEGATIVE

## 2016-06-10 LAB — PROTIME-INR
INR: 1.36
PROTHROMBIN TIME: 16.9 s — AB (ref 11.4–15.2)

## 2016-06-10 LAB — SEDIMENTATION RATE: Sed Rate: 99 mm/hr — ABNORMAL HIGH (ref 0–22)

## 2016-06-10 LAB — LACTIC ACID, PLASMA: Lactic Acid, Venous: 1 mmol/L (ref 0.5–1.9)

## 2016-06-10 MED ORDER — ALPRAZOLAM 0.5 MG PO TABS
0.5000 mg | ORAL_TABLET | Freq: Two times a day (BID) | ORAL | Status: DC | PRN
  Administered 2016-06-11: 0.5 mg via ORAL
  Filled 2016-06-10: qty 1

## 2016-06-10 MED ORDER — ONDANSETRON HCL 4 MG PO TABS: 4.0000 mg | ORAL_TABLET | Freq: Four times a day (QID) | ORAL | Status: DC | PRN

## 2016-06-10 MED ORDER — INSULIN ASPART 100 UNIT/ML ~~LOC~~ SOLN: 0.0000 [IU] | Freq: Three times a day (TID) | SUBCUTANEOUS | Status: DC

## 2016-06-10 MED ORDER — ACETAMINOPHEN 325 MG PO TABS
650.0000 mg | ORAL_TABLET | Freq: Four times a day (QID) | ORAL | Status: DC | PRN
  Administered 2016-06-10 – 2016-06-16 (×6): 650 mg via ORAL
  Filled 2016-06-10 (×7): qty 2

## 2016-06-10 MED ORDER — PIPERACILLIN-TAZOBACTAM 3.375 G IVPB
3.3750 g | Freq: Three times a day (TID) | INTRAVENOUS | Status: DC
  Administered 2016-06-10 – 2016-06-16 (×17): 3.375 g via INTRAVENOUS
  Filled 2016-06-10 (×18): qty 50

## 2016-06-10 MED ORDER — ENOXAPARIN SODIUM 40 MG/0.4ML ~~LOC~~ SOLN
40.0000 mg | SUBCUTANEOUS | Status: DC
Start: 2016-06-10 — End: 2016-06-16
  Administered 2016-06-10 – 2016-06-15 (×6): 40 mg via SUBCUTANEOUS
  Filled 2016-06-10 (×6): qty 0.4

## 2016-06-10 MED ORDER — METHOCARBAMOL 500 MG PO TABS
1000.0000 mg | ORAL_TABLET | Freq: Four times a day (QID) | ORAL | Status: DC | PRN
  Administered 2016-06-10 – 2016-06-16 (×12): 1000 mg via ORAL
  Filled 2016-06-10 (×12): qty 2

## 2016-06-10 MED ORDER — POLYETHYLENE GLYCOL 3350 17 G PO PACK
17.0000 g | PACK | Freq: Every day | ORAL | Status: DC
  Administered 2016-06-13 – 2016-06-15 (×2): 17 g via ORAL
  Filled 2016-06-10 (×5): qty 1

## 2016-06-10 MED ORDER — SODIUM CHLORIDE 0.9 % IV BOLUS (SEPSIS)
1000.0000 mL | Freq: Once | INTRAVENOUS | Status: AC
  Administered 2016-06-10: 1000 mL via INTRAVENOUS

## 2016-06-10 MED ORDER — VITAMIN D 1000 UNITS PO TABS
2000.0000 [IU] | ORAL_TABLET | Freq: Every day | ORAL | Status: DC
  Administered 2016-06-11 – 2016-06-16 (×6): 2000 [IU] via ORAL
  Filled 2016-06-10 (×7): qty 2

## 2016-06-10 MED ORDER — SODIUM CHLORIDE 0.9 % IV SOLN
INTRAVENOUS | Status: DC
  Administered 2016-06-10: 22:00:00 via INTRAVENOUS

## 2016-06-10 MED ORDER — PANTOPRAZOLE SODIUM 40 MG PO TBEC
40.0000 mg | DELAYED_RELEASE_TABLET | Freq: Every day | ORAL | Status: DC
  Administered 2016-06-11 – 2016-06-16 (×6): 40 mg via ORAL
  Filled 2016-06-10 (×6): qty 1

## 2016-06-10 MED ORDER — OMEGA-3-ACID ETHYL ESTERS 1 G PO CAPS
1.0000 g | ORAL_CAPSULE | Freq: Every day | ORAL | Status: DC
  Filled 2016-06-10: qty 1

## 2016-06-10 MED ORDER — OXYCODONE HCL 5 MG PO TABS
10.0000 mg | ORAL_TABLET | ORAL | Status: DC | PRN
  Administered 2016-06-11: 10 mg via ORAL
  Filled 2016-06-10: qty 2

## 2016-06-10 MED ORDER — GABAPENTIN 600 MG PO TABS: 600.0000 mg | ORAL_TABLET | Freq: Three times a day (TID) | ORAL | Status: DC

## 2016-06-10 MED ORDER — OXYCODONE HCL ER 10 MG PO T12A
10.0000 mg | EXTENDED_RELEASE_TABLET | Freq: Two times a day (BID) | ORAL | Status: DC
  Administered 2016-06-10: 10 mg via ORAL
  Filled 2016-06-10 (×2): qty 1

## 2016-06-10 MED ORDER — SENNA 8.6 MG PO TABS
1.0000 | ORAL_TABLET | Freq: Two times a day (BID) | ORAL | Status: DC
  Administered 2016-06-10 – 2016-06-15 (×9): 8.6 mg via ORAL
  Filled 2016-06-10 (×10): qty 1

## 2016-06-10 MED ORDER — PIPERACILLIN-TAZOBACTAM 3.375 G IVPB 30 MIN
3.3750 g | Freq: Once | INTRAVENOUS | Status: AC
  Administered 2016-06-10: 3.375 g via INTRAVENOUS
  Filled 2016-06-10: qty 50

## 2016-06-10 MED ORDER — ONDANSETRON HCL 4 MG/2ML IJ SOLN
4.0000 mg | Freq: Four times a day (QID) | INTRAMUSCULAR | Status: DC | PRN
  Administered 2016-06-12: 4 mg via INTRAVENOUS

## 2016-06-10 MED ORDER — IOPAMIDOL (ISOVUE-300) INJECTION 61%
INTRAVENOUS | Status: AC
  Administered 2016-06-10: 100 mL
  Filled 2016-06-10: qty 100

## 2016-06-10 MED ORDER — VANCOMYCIN HCL IN DEXTROSE 1-5 GM/200ML-% IV SOLN
1000.0000 mg | Freq: Three times a day (TID) | INTRAVENOUS | Status: DC
  Administered 2016-06-11 – 2016-06-15 (×13): 1000 mg via INTRAVENOUS
  Filled 2016-06-10 (×16): qty 200

## 2016-06-10 MED ORDER — DOCUSATE SODIUM 100 MG PO CAPS
100.0000 mg | ORAL_CAPSULE | Freq: Two times a day (BID) | ORAL | Status: DC
  Administered 2016-06-10 – 2016-06-15 (×9): 100 mg via ORAL
  Filled 2016-06-10 (×12): qty 1

## 2016-06-10 MED ORDER — DIPHENHYDRAMINE HCL 50 MG/ML IJ SOLN: 12.5000 mg | Freq: Four times a day (QID) | INTRAMUSCULAR | Status: DC | PRN

## 2016-06-10 MED ORDER — ACETAMINOPHEN 650 MG RE SUPP: 650.0000 mg | Freq: Four times a day (QID) | RECTAL | Status: DC | PRN

## 2016-06-10 MED ORDER — BISACODYL 10 MG RE SUPP: 10.0000 mg | Freq: Every day | RECTAL | Status: DC | PRN

## 2016-06-10 MED ORDER — LEVOTHYROXINE SODIUM 88 MCG PO TABS
88.0000 ug | ORAL_TABLET | Freq: Every day | ORAL | Status: DC
  Administered 2016-06-11 – 2016-06-16 (×6): 88 ug via ORAL
  Filled 2016-06-10 (×6): qty 1

## 2016-06-10 MED ORDER — INSULIN ASPART 100 UNIT/ML ~~LOC~~ SOLN
0.0000 [IU] | Freq: Every day | SUBCUTANEOUS | Status: DC
Start: 2016-06-10 — End: 2016-06-13

## 2016-06-10 MED ORDER — CITALOPRAM HYDROBROMIDE 20 MG PO TABS
40.0000 mg | ORAL_TABLET | Freq: Every day | ORAL | Status: DC
  Administered 2016-06-11 – 2016-06-16 (×6): 40 mg via ORAL
  Filled 2016-06-10 (×6): qty 2

## 2016-06-10 MED ORDER — GABAPENTIN 300 MG PO CAPS
600.0000 mg | ORAL_CAPSULE | Freq: Three times a day (TID) | ORAL | Status: DC
  Administered 2016-06-10 – 2016-06-16 (×16): 600 mg via ORAL
  Filled 2016-06-10 (×4): qty 2
  Filled 2016-06-10: qty 6
  Filled 2016-06-10 (×13): qty 2

## 2016-06-10 MED ORDER — FENTANYL CITRATE (PF) 100 MCG/2ML IJ SOLN
50.0000 ug | Freq: Once | INTRAMUSCULAR | Status: AC
  Administered 2016-06-10: 50 ug via INTRAVENOUS
  Filled 2016-06-10: qty 2

## 2016-06-10 MED ORDER — VITAMIN B-12 1000 MCG PO TABS
1000.0000 ug | ORAL_TABLET | Freq: Every day | ORAL | Status: DC
  Administered 2016-06-11 – 2016-06-16 (×6): 1000 ug via ORAL
  Filled 2016-06-10 (×6): qty 1

## 2016-06-10 MED ORDER — VANCOMYCIN HCL IN DEXTROSE 1-5 GM/200ML-% IV SOLN
1000.0000 mg | Freq: Once | INTRAVENOUS | Status: AC
  Administered 2016-06-10: 1000 mg via INTRAVENOUS
  Filled 2016-06-10: qty 200

## 2016-06-10 MED ORDER — MENTHOL 3 MG MT LOZG: 1.0000 | LOZENGE | OROMUCOSAL | Status: DC | PRN

## 2016-06-10 NOTE — ED Provider Notes (Signed)
  Face-to-face evaluation   History: Patient here for tender and concerning sacral decubitus.  She also complains of pain in her knees, and discomfort with mobilization of her knees.  She is currently in a skilled nursing facility.  She is not getting active physical therapy at this time.    Physical exam: Alert, cooperative.  Large sacral/coccygeal cubitus, with overlying necrotic tissue, and associated left-sided induration fluctuance and swelling, in the buttock.  Evaluation consistent with secondary infection with abscess.  Medical screening examination/treatment/procedure(s) were conducted as a shared visit with non-physician practitioner(s) and myself.  I personally evaluated the patient during the encounter   Daleen Bo, MD 06/11/16 [phone]

## 2016-06-10 NOTE — ED Notes (Signed)
Bed: WA08 Expected date:  Expected time:  Means of arrival:  Comments: EMS-fever

## 2016-06-10 NOTE — ED Notes (Signed)
Unable to collect labs at this time patient is on the phone
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
## 2016-06-10 NOTE — Progress Notes (Addendum)
Pharmacy Antibiotic Note  [name] is a 36 y.o. female admitted on 06/10/2016 with sepsis secondary to possible sacral wound infection. Pharmacy has been consulted for Vancomycin and Zosyn dosing.  Plan: Vancomycin 1g IV q8h. Plan for Vancomycin trough level at steady state.  Zosyn 3.375g IV x 1 over 30 minutes, then Zosyn 3.375g IV q8h (infuse over 4 hours). Monitor renal function, cultures, clinical course.   Weight: 195 lb (88.5 kg)  Temp (24hrs), Avg:102.2 F (39 C), Min:102.2 F (39 C), Max:102.2 F (39 C)   Recent Labs Lab 06/10/16 1418 06/10/16 1433  WBC 16.7*  --   CREATININE 0.42*  --   LATICACIDVEN  --  2.08*    Estimated Creatinine Clearance: 111.1 mL/min (A) (by C-G formula based on SCr of 0.42 mg/dL (L)).    No Known Allergies  Antimicrobials this admission: 4/10 >> Vancomycin >> 4/10 >> Zosyn >>  Dose adjustments this admission: --  Microbiology results: 4/10 BCx: sent 4/10 UCx: sent   Thank you for allowing pharmacy to be a part of this patient's care.   Lindell Spar, PharmD, BCPS Pager: [phone] 06/10/2016 3:28 PM
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
## 2016-06-11 DIAGNOSIS — E871 Hypo-osmolality and hyponatremia: Secondary | ICD-10-CM

## 2016-06-11 DIAGNOSIS — G8220 Paraplegia, unspecified: Secondary | ICD-10-CM

## 2016-06-11 DIAGNOSIS — T148XXA Other injury of unspecified body region, initial encounter: Secondary | ICD-10-CM

## 2016-06-11 DIAGNOSIS — L0390 Cellulitis, unspecified: Secondary | ICD-10-CM

## 2016-06-11 DIAGNOSIS — F418 Other specified anxiety disorders: Secondary | ICD-10-CM

## 2016-06-11 DIAGNOSIS — L089 Local infection of the skin and subcutaneous tissue, unspecified: Secondary | ICD-10-CM

## 2016-06-11 DIAGNOSIS — N312 Flaccid neuropathic bladder, not elsewhere classified: Secondary | ICD-10-CM

## 2016-06-11 DIAGNOSIS — N390 Urinary tract infection, site not specified: Secondary | ICD-10-CM

## 2016-06-11 DIAGNOSIS — E039 Hypothyroidism, unspecified: Secondary | ICD-10-CM

## 2016-06-11 DIAGNOSIS — L8990 Pressure ulcer of unspecified site, unspecified stage: Secondary | ICD-10-CM | POA: Insufficient documentation

## 2016-06-11 DIAGNOSIS — A419 Sepsis, unspecified organism: Principal | ICD-10-CM

## 2016-06-11 DIAGNOSIS — D649 Anemia, unspecified: Secondary | ICD-10-CM

## 2016-06-11 LAB — CBC WITH DIFFERENTIAL/PLATELET
Basophils Absolute: 0 10*3/uL (ref 0.0–0.1)
Basophils Relative: 0 %
EOS PCT: 4 %
Eosinophils Absolute: 0.5 10*3/uL (ref 0.0–0.7)
HCT: 27 % — ABNORMAL LOW (ref 36.0–46.0)
HEMOGLOBIN: 8.5 g/dL — AB (ref 12.0–15.0)
LYMPHS ABS: 2.2 10*3/uL (ref 0.7–4.0)
LYMPHS PCT: 19 %
MCH: 26.8 pg (ref 26.0–34.0)
MCHC: 31.5 g/dL (ref 30.0–36.0)
MCV: 85.2 fL (ref 78.0–100.0)
Monocytes Absolute: 1.1 10*3/uL — ABNORMAL HIGH (ref 0.1–1.0)
Monocytes Relative: 9 %
Neutro Abs: 7.7 10*3/uL (ref 1.7–7.7)
Neutrophils Relative %: 68 %
PLATELETS: 471 10*3/uL — AB (ref 150–400)
RBC: 3.17 MIL/uL — ABNORMAL LOW (ref 3.87–5.11)
RDW: 14 % (ref 11.5–15.5)
WBC: 11.4 10*3/uL — AB (ref 4.0–10.5)

## 2016-06-11 LAB — URINE CULTURE: Culture: >=100000 — AB

## 2016-06-11 LAB — BASIC METABOLIC PANEL
Anion gap: 9 (ref 5–15)
CHLORIDE: 100 mmol/L — AB (ref 101–111)
CO2: 26 mmol/L (ref 22–32)
Calcium: 7.9 mg/dL — ABNORMAL LOW (ref 8.9–10.3)
Creatinine, Ser: 0.41 mg/dL — ABNORMAL LOW (ref 0.44–1.00)
GFR calc Af Amer: >60 mL/min (ref >60)
GFR calc non Af Amer: >60 mL/min (ref >60)
GLUCOSE: 100 mg/dL — AB (ref 65–99)
POTASSIUM: 2.8 mmol/L — AB (ref 3.5–5.1)
Sodium: 135 mmol/L (ref 135–145)

## 2016-06-11 LAB — IRON AND TIBC
IRON: 9 ug/dL — AB (ref 28–170)
Saturation Ratios: 6 % — ABNORMAL LOW (ref 10.4–31.8)
TIBC: 160 ug/dL — ABNORMAL LOW (ref 250–450)
UIBC: 151 ug/dL

## 2016-06-11 LAB — GLUCOSE, CAPILLARY
GLUCOSE-CAPILLARY: 100 mg/dL — AB (ref 65–99)
Glucose-Capillary: 97 mg/dL (ref 65–99)
Glucose-Capillary: 109 mg/dL — ABNORMAL HIGH (ref 65–99)
Glucose-Capillary: 108 mg/dL — ABNORMAL HIGH (ref 65–99)

## 2016-06-11 LAB — VITAMIN B12: VITAMIN B 12: 436 pg/mL (ref 180–914)

## 2016-06-11 LAB — C-REACTIVE PROTEIN: CRP: 23.7 mg/dL — ABNORMAL HIGH (ref <1.0)

## 2016-06-11 LAB — ABO/RH: ABO/RH(D): A POS

## 2016-06-11 LAB — FERRITIN: Ferritin: 432 ng/mL — ABNORMAL HIGH (ref 11–307)

## 2016-06-11 LAB — FOLATE: FOLATE: 8.2 ng/mL (ref >5.9)

## 2016-06-11 MED ORDER — MAGNESIUM SULFATE 2 GM/50ML IV SOLN
2.0000 g | Freq: Once | INTRAVENOUS | Status: AC
  Administered 2016-06-11: 2 g via INTRAVENOUS
  Filled 2016-06-11: qty 50

## 2016-06-11 MED ORDER — MORPHINE SULFATE (PF) 2 MG/ML IV SOLN
2.0000 mg | INTRAVENOUS | Status: DC | PRN
  Administered 2016-06-12 (×2): 2 mg via INTRAVENOUS
  Filled 2016-06-11 (×2): qty 1

## 2016-06-11 MED ORDER — PREMIER PROTEIN SHAKE
11.0000 [oz_av] | Freq: Two times a day (BID) | ORAL | Status: DC
  Administered 2016-06-11 – 2016-06-16 (×5): 11 [oz_av] via ORAL

## 2016-06-11 MED ORDER — ALPRAZOLAM 0.25 MG PO TABS: 0.2500 mg | ORAL_TABLET | Freq: Three times a day (TID) | ORAL | Status: DC | PRN

## 2016-06-11 MED ORDER — POTASSIUM CHLORIDE CRYS ER 20 MEQ PO TBCR
40.0000 meq | EXTENDED_RELEASE_TABLET | Freq: Four times a day (QID) | ORAL | Status: AC
  Administered 2016-06-11 (×2): 40 meq via ORAL
  Filled 2016-06-11 (×2): qty 2

## 2016-06-11 MED ORDER — OXYCODONE HCL 5 MG PO TABS
10.0000 mg | ORAL_TABLET | Freq: Four times a day (QID) | ORAL | Status: DC | PRN
  Administered 2016-06-11 – 2016-06-16 (×15): 10 mg via ORAL
  Filled 2016-06-11 (×16): qty 2

## 2016-06-11 MED ORDER — DIPHENHYDRAMINE HCL 25 MG PO CAPS: 25.0000 mg | ORAL_CAPSULE | Freq: Four times a day (QID) | ORAL | Status: DC | PRN
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
## 2016-06-11 NOTE — NC FL2 (Signed)
[place] MEDICAID FL2 LEVEL OF CARE SCREENING TOOL     IDENTIFICATION  Patient Name: [name] Birthdate: [birth year] Sex: female Admission Date (Current Location): 06/10/2016  [facility] and [place] Number:  Engineer, manufacturing systems and Address:  [facility],  [place] [address], [place]      Provider Number: [id number]  Attending Physician Name and Address:  Verlee Monte, MD  Relative Name and Phone Number:       Current Level of Care: Hospital Recommended Level of Care: [place] Prior Approval Number:    Date Approved/Denied:   PASRR Number:    Discharge Plan: SNF    Current Diagnoses: Patient Active Problem List   Diagnosis Date Noted  . Pressure injury of skin 06/11/2016  . Sepsis due to cellulitis ([place]) 06/10/2016  . Normocytic anemia 06/10/2016  . Hyponatremia 06/10/2016  . Acute lower UTI 06/10/2016  . Wound infection   . 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 L1 vertebral fracture ([place]) 04/07/2016  . Depression with anxiety 06/06/2013  . Dysmenorrhea 08/09/2012  . Irregular menstrual cycle 08/09/2012  . Shoulder pain, left 11/07/2011  . H/O vitamin D deficiency 09/02/2011  . Obesity 08/06/2010  . Hypothyroidism 08/06/2010  . Smoking 08/06/2010  . Mental retardation, mild (I.Q. 50-70) 08/06/2010    Orientation RESPIRATION BLADDER Height & Weight     Self, Time, Situation, Place  Normal Incontinent Weight: 218 lb 0.6 oz (98.9 kg) Height:  5\' 6"  (167.6 cm)  BEHAVIORAL SYMPTOMS/MOOD NEUROLOGICAL BOWEL NUTRITION STATUS      Continent Diet (See DC summary)  AMBULATORY STATUS COMMUNICATION OF NEEDS Skin   Extensive Assist Verbally Other (Comment)   Unstageable - Full thickness tissue loss in which the base of the ulcer is covered by slough (yellow, tan, gray, green or brown) and/or eschar (tan, brown or black) in the wound bed.   (Sacrum)  Stage III -Full thickness tissue loss. Subcutaneous fat may be visible but bone, tendon or muscle are NOT exposed.  (L Heel)                     Personal Care Assistance Level of Assistance  Bathing, Feeding, Dressing Bathing Assistance: Limited assistance Feeding assistance: Independent Dressing Assistance: Limited assistance     Functional Limitations Info  Sight, Hearing, Speech Sight Info: Adequate Hearing Info: Adequate Speech Info: Adequate    SPECIAL CARE FACTORS FREQUENCY                       Contractures Contractures Info: Not present    Additional Factors Info  Code Status, Allergies Code Status Info: Full Code Allergies Info: NKA           Current Medications (06/11/2016):  This is the current hospital active medication list Current Facility-Administered Medications  Medication Dose Route Frequency Provider Last Rate Last Dose  . acetaminophen (TYLENOL) tablet 650 mg  650 mg Oral Q6H PRN Vianne Bulls, MD   650 mg at 06/10/16 1943   Or  . acetaminophen (TYLENOL) suppository 650 mg  650 mg Rectal Q6H PRN Vianne Bulls, MD      . ALPRAZolam [name]) tablet 0.25 mg  0.25 mg Oral TID PRN Verlee Monte, MD      . bisacodyl (DULCOLAX) suppository 10 mg  10 mg Rectal Daily PRN Vianne Bulls, MD      . cholecalciferol (VITAMIN  D) tablet 2,000 Units  2,000 Units Oral Daily Vianne Bulls, MD   2,000 Units at 06/11/16 0915  . citalopram (CELEXA) tablet 40 mg  40 mg Oral Daily Vianne Bulls, MD   40 mg at 06/11/16 0915  . diphenhydrAMINE (BENADRYL) capsule 25 mg  25 mg Oral Q6H PRN Verlee Monte, MD      . docusate sodium (COLACE) capsule 100 mg  100 mg Oral BID Vianne Bulls, MD   100 mg at 06/11/16 0914  . enoxaparin (LOVENOX) injection 40 mg  40 mg Subcutaneous Q24H Vianne Bulls, MD   40 mg at 06/10/16 2136  . gabapentin (NEURONTIN) capsule 600 mg  600 mg Oral TID Vianne Bulls, MD   600 mg at 06/11/16 0914  . insulin aspart (novoLOG)  injection 0-5 Units  0-5 Units Subcutaneous QHS Timothy S Opyd, MD      . insulin aspart (novoLOG) injection 0-9 Units  0-9 Units Subcutaneous TID WC Timothy S Opyd, MD      . levothyroxine (SYNTHROID, LEVOTHROID) tablet 88 mcg  88 mcg Oral QAC breakfast Vianne Bulls, MD   88 mcg at 06/11/16 0917  . menthol-cetylpyridinium (CEPACOL) lozenge 3 mg  1 lozenge Oral PRN Vianne Bulls, MD      . methocarbamol (ROBAXIN) tablet 1,000 mg  1,000 mg Oral Q6H PRN Vianne Bulls, MD   1,000 mg at 06/11/16 0553  . ondansetron (ZOFRAN) tablet 4 mg  4 mg Oral Q6H PRN Vianne Bulls, MD       Or  . ondansetron (ZOFRAN) injection 4 mg  4 mg Intravenous Q6H PRN Vianne Bulls, MD      . pantoprazole (PROTONIX) EC tablet 40 mg  40 mg Oral Daily Vianne Bulls, MD   40 mg at 06/11/16 0915  . piperacillin-tazobactam (ZOSYN) IVPB 3.375 g  3.375 g Intravenous Q8H Luiz Ochoa, RPH   3.375 g at 06/11/16 0548  . polyethylene glycol (MIRALAX / GLYCOLAX) packet 17 g  17 g Oral Daily Vianne Bulls, MD      . senna (SENOKOT) tablet 8.6 mg  1 tablet Oral BID Vianne Bulls, MD   8.6 mg at 06/11/16 0915  . vancomycin (VANCOCIN) IVPB 1000 mg/200 mL premix  1,000 mg Intravenous Q8H Luiz Ochoa, RPH   1,000 mg at 06/11/16 8527  . vitamin B-12 (CYANOCOBALAMIN) tablet 1,000 mcg  1,000 mcg Oral Daily Vianne Bulls, MD   1,000 mcg at 06/11/16 7824     Discharge Medications: Please see discharge summary for a list of discharge medications.  Relevant Imaging Results:  Relevant Lab Results:   Additional Information SSN:  [id number]  Continue with Wound Care at facility.   Lilly [place], LCSW
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
## 2016-06-12 ENCOUNTER — Encounter (HOSPITAL_COMMUNITY)
Admission: EM | Disposition: A | Discharge status: Skilled Nursing Facility | Payer: Self-pay | Source: Home / Self Care | Attending: Internal Medicine

## 2016-06-12 ENCOUNTER — Inpatient Hospital Stay (HOSPITAL_COMMUNITY): Payer: Medicaid Other | Admitting: Anesthesiology

## 2016-06-12 ENCOUNTER — Encounter (HOSPITAL_COMMUNITY): Payer: Self-pay | Admitting: Anesthesiology

## 2016-06-12 DIAGNOSIS — R6510 Systemic inflammatory response syndrome (SIRS) of non-infectious origin without acute organ dysfunction: Secondary | ICD-10-CM

## 2016-06-12 DIAGNOSIS — L8945 Pressure ulcer of contiguous site of back, buttock and hip, unstageable: Secondary | ICD-10-CM

## 2016-06-12 HISTORY — PX: IRRIGATION AND DEBRIDEMENT BUTTOCKS: SHX6601

## 2016-06-12 LAB — SURGICAL PCR SCREEN
MRSA, PCR: NEGATIVE
STAPHYLOCOCCUS AUREUS: NEGATIVE

## 2016-06-12 LAB — BASIC METABOLIC PANEL
Anion gap: 9 (ref 5–15)
BUN: 6 mg/dL (ref 6–20)
CALCIUM: 8.1 mg/dL — AB (ref 8.9–10.3)
CO2: 27 mmol/L (ref 22–32)
CREATININE: 0.37 mg/dL — AB (ref 0.44–1.00)
Chloride: 103 mmol/L (ref 101–111)
GFR calc Af Amer: >60 mL/min (ref >60)
GFR calc non Af Amer: >60 mL/min (ref >60)
GLUCOSE: 84 mg/dL (ref 65–99)
Potassium: 3.6 mmol/L (ref 3.5–5.1)
SODIUM: 139 mmol/L (ref 135–145)

## 2016-06-12 LAB — GLUCOSE, CAPILLARY
GLUCOSE-CAPILLARY: 87 mg/dL (ref 65–99)
GLUCOSE-CAPILLARY: 93 mg/dL (ref 65–99)
Glucose-Capillary: 95 mg/dL (ref 65–99)
Glucose-Capillary: 75 mg/dL (ref 65–99)

## 2016-06-12 LAB — CBC
HCT: 28.3 % — ABNORMAL LOW (ref 36.0–46.0)
Hemoglobin: 8.7 g/dL — ABNORMAL LOW (ref 12.0–15.0)
MCH: 25.4 pg — ABNORMAL LOW (ref 26.0–34.0)
MCHC: 30.7 g/dL (ref 30.0–36.0)
MCV: 82.7 fL (ref 78.0–100.0)
PLATELETS: 503 10*3/uL — AB (ref 150–400)
RBC: 3.42 MIL/uL — ABNORMAL LOW (ref 3.87–5.11)
RDW: 14 % (ref 11.5–15.5)
WBC: 11.3 10*3/uL — AB (ref 4.0–10.5)

## 2016-06-12 LAB — MAGNESIUM: MAGNESIUM: 1.8 mg/dL (ref 1.7–2.4)

## 2016-06-12 LAB — HIV ANTIBODY (ROUTINE TESTING W REFLEX): HIV Screen 4th Generation wRfx: NONREACTIVE

## 2016-06-12 SURGERY — IRRIGATION AND DEBRIDEMENT BUTTOCKS
Anesthesia: General | Site: Buttocks

## 2016-06-12 MED ORDER — 0.9 % SODIUM CHLORIDE (POUR BTL) OPTIME
TOPICAL | Status: DC | PRN
  Administered 2016-06-12: 1000 mL

## 2016-06-12 MED ORDER — SCOPOLAMINE 1 MG/3DAYS TD PT72
1.0000 | MEDICATED_PATCH | Freq: Once | TRANSDERMAL | Status: DC
  Administered 2016-06-12: 1.5 mg via TRANSDERMAL

## 2016-06-12 MED ORDER — ONDANSETRON HCL 4 MG/2ML IJ SOLN
INTRAMUSCULAR | Status: AC
  Filled 2016-06-12: qty 2

## 2016-06-12 MED ORDER — HYDROMORPHONE HCL 2 MG/ML IJ SOLN
INTRAMUSCULAR | Status: AC
  Administered 2016-06-12: 0.3 mg via INTRAVENOUS
  Filled 2016-06-12: qty 1

## 2016-06-12 MED ORDER — SODIUM CHLORIDE 0.9 % IV SOLN
INTRAVENOUS | Status: DC
  Administered 2016-06-12: 12:00:00 via INTRAVENOUS
  Administered 2016-06-12: 1000 mL via INTRAVENOUS

## 2016-06-12 MED ORDER — SUGAMMADEX SODIUM 200 MG/2ML IV SOLN
INTRAVENOUS | Status: DC | PRN
  Administered 2016-06-12: 200 mg via INTRAVENOUS

## 2016-06-12 MED ORDER — MIDAZOLAM HCL 2 MG/2ML IJ SOLN
INTRAMUSCULAR | Status: AC
  Filled 2016-06-12: qty 2

## 2016-06-12 MED ORDER — PROPOFOL 10 MG/ML IV BOLUS
INTRAVENOUS | Status: DC | PRN
  Administered 2016-06-12: 150 mg via INTRAVENOUS

## 2016-06-12 MED ORDER — PROPOFOL 10 MG/ML IV BOLUS
INTRAVENOUS | Status: AC
  Filled 2016-06-12: qty 20

## 2016-06-12 MED ORDER — LACTATED RINGERS IV SOLN
INTRAVENOUS | Status: DC
  Administered 2016-06-12: 14:00:00 via INTRAVENOUS

## 2016-06-12 MED ORDER — FENTANYL CITRATE (PF) 100 MCG/2ML IJ SOLN
INTRAMUSCULAR | Status: DC | PRN
  Administered 2016-06-12: 100 ug via INTRAVENOUS
  Administered 2016-06-12: 50 ug via INTRAVENOUS

## 2016-06-12 MED ORDER — FENTANYL CITRATE (PF) 250 MCG/5ML IJ SOLN
INTRAMUSCULAR | Status: AC
  Filled 2016-06-12: qty 5

## 2016-06-12 MED ORDER — LIDOCAINE 2% (20 MG/ML) 5 ML SYRINGE
INTRAMUSCULAR | Status: AC
  Filled 2016-06-12: qty 5

## 2016-06-12 MED ORDER — METOCLOPRAMIDE HCL 5 MG/ML IJ SOLN: 10.0000 mg | Freq: Once | INTRAMUSCULAR | Status: DC | PRN

## 2016-06-12 MED ORDER — DEXAMETHASONE SODIUM PHOSPHATE 10 MG/ML IJ SOLN
INTRAMUSCULAR | Status: AC
  Filled 2016-06-12: qty 1

## 2016-06-12 MED ORDER — ROCURONIUM BROMIDE 50 MG/5ML IV SOSY
PREFILLED_SYRINGE | INTRAVENOUS | Status: AC
  Filled 2016-06-12: qty 5

## 2016-06-12 MED ORDER — SUCCINYLCHOLINE CHLORIDE 200 MG/10ML IV SOSY
PREFILLED_SYRINGE | INTRAVENOUS | Status: AC
  Filled 2016-06-12: qty 10

## 2016-06-12 MED ORDER — MIDAZOLAM HCL 5 MG/5ML IJ SOLN
INTRAMUSCULAR | Status: DC | PRN
  Administered 2016-06-12: 2 mg via INTRAVENOUS

## 2016-06-12 MED ORDER — PIPERACILLIN-TAZOBACTAM 3.375 G IVPB
INTRAVENOUS | Status: AC
  Filled 2016-06-12: qty 50

## 2016-06-12 MED ORDER — SCOPOLAMINE 1 MG/3DAYS TD PT72
MEDICATED_PATCH | TRANSDERMAL | Status: AC
  Administered 2016-06-12: 1.5 mg via TRANSDERMAL
  Filled 2016-06-12: qty 1

## 2016-06-12 MED ORDER — LIDOCAINE 2% (20 MG/ML) 5 ML SYRINGE
INTRAMUSCULAR | Status: DC | PRN
  Administered 2016-06-12: 75 mg via INTRAVENOUS

## 2016-06-12 MED ORDER — HYDROMORPHONE HCL 2 MG/ML IJ SOLN
0.2500 mg | INTRAMUSCULAR | Status: DC | PRN
  Administered 2016-06-12 (×2): 0.3 mg via INTRAVENOUS

## 2016-06-12 MED ORDER — MORPHINE SULFATE (PF) 10 MG/ML IV SOLN
2.0000 mg | INTRAVENOUS | Status: DC | PRN
  Administered 2016-06-12 – 2016-06-16 (×13): 2 mg via INTRAVENOUS
  Filled 2016-06-12 (×13): qty 1

## 2016-06-12 MED ORDER — MEPERIDINE HCL 50 MG/ML IJ SOLN: 6.2500 mg | INTRAMUSCULAR | Status: DC | PRN

## 2016-06-12 MED ORDER — ROCURONIUM BROMIDE 10 MG/ML (PF) SYRINGE
PREFILLED_SYRINGE | INTRAVENOUS | Status: DC | PRN
  Administered 2016-06-12: 50 mg via INTRAVENOUS

## 2016-06-12 MED ORDER — SUGAMMADEX SODIUM 200 MG/2ML IV SOLN
INTRAVENOUS | Status: AC
  Filled 2016-06-12: qty 2

## 2016-06-12 SURGICAL SUPPLY — 27 items
BLADE SURG SZ10 CARB STEEL (BLADE) ×3 IMPLANT
BNDG GAUZE ELAST 4 BULKY (GAUZE/BANDAGES/DRESSINGS) ×3 IMPLANT
CONT SPEC 4OZ CLIKSEAL STRL BL (MISCELLANEOUS) ×3 IMPLANT
DECANTER SPIKE VIAL GLASS SM (MISCELLANEOUS) IMPLANT
DERMABOND ADVANCED (GAUZE/BANDAGES/DRESSINGS)
DERMABOND ADVANCED .7 DNX12 (GAUZE/BANDAGES/DRESSINGS) IMPLANT
DRAPE LAPAROTOMY TRNSV 102X78 (DRAPE) IMPLANT
ELECT PENCIL ROCKER SW 15FT (MISCELLANEOUS) ×3 IMPLANT
ELECT REM PT RETURN 15FT ADLT (MISCELLANEOUS) ×3 IMPLANT
GAUZE SPONGE 4X4 12PLY STRL (GAUZE/BANDAGES/DRESSINGS) ×3 IMPLANT
GLOVE BIOGEL PI IND STRL 7.0 (GLOVE) ×1 IMPLANT
GLOVE BIOGEL PI INDICATOR 7.0 (GLOVE) ×2
GOWN STRL REUS W/TWL 2XL LVL3 (GOWN DISPOSABLE) ×3 IMPLANT
GOWN STRL REUS W/TWL XL LVL3 (GOWN DISPOSABLE) ×3 IMPLANT
KIT BASIN OR (CUSTOM PROCEDURE TRAY) ×3 IMPLANT
NEEDLE HYPO 22GX1.5 SAFETY (NEEDLE) ×3 IMPLANT
PACK BASIC VI WITH GOWN DISP (CUSTOM PROCEDURE TRAY) ×3 IMPLANT
PAD ABD 8X10 STRL (GAUZE/BANDAGES/DRESSINGS) ×3 IMPLANT
SPONGE LAP 18X18 X RAY DECT (DISPOSABLE) ×3 IMPLANT
STAPLER VISISTAT 35W (STAPLE) IMPLANT
SUT VIC AB 3-0 SH 18 (SUTURE) IMPLANT
SUT VIC AB 4-0 PS2 18 (SUTURE) IMPLANT
SYR 20CC LL (SYRINGE) ×3 IMPLANT
TAPE CLOTH SURG 4X10 WHT LF (GAUZE/BANDAGES/DRESSINGS) ×3 IMPLANT
TOWEL OR 17X26 10 PK STRL BLUE (TOWEL DISPOSABLE) ×3 IMPLANT
TOWEL OR NON WOVEN STRL DISP B (DISPOSABLE) ×3 IMPLANT
YANKAUER SUCT BULB TIP 10FT TU (MISCELLANEOUS) ×3 IMPLANT
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
## 2016-06-12 NOTE — Op Note (Signed)
06/10/2016 - 06/12/2016  3:54 PM  PATIENT:  [name]  36 y.o. female  Patient Care Team: Elby Showers, MD as PCP - General (Internal Medicine) Elby Showers, MD (Internal Medicine)  PRE-OPERATIVE DIAGNOSIS:  sacral ulcer  POST-OPERATIVE DIAGNOSIS:  sacral ulcer  PROCEDURE:  Procedure(s): IRRIGATION AND DEBRIDEMENT BUTTOCKS  SURGEON:  Surgeon(s): Leighton Ruff, MD  ASSISTANT: none   ANESTHESIA:   general  EBL:  Total I/O In: 1476.7 [I.V.:1276.7; IV Piggyback:200] Out: 625 [Urine:600; Blood:25]  DRAINS: none   SPECIMEN:  Source of Specimen:  tissue culture for micro  DISPOSITION OF SPECIMEN:  Micro  COUNTS:  YES  PLAN OF CARE: inpatient  PATIENT DISPOSITION:  PACU - hemodynamically stable.  INDICATION: 36 y.o. F with sacral decubitus   OR FINDINGS: stage 4 pressure ulcer  DESCRIPTION: the patient was identified in the preoperative holding area and taken to the OR where general anesthesia was induced without difficulty. they were then laid prone on the operating room table.   SCDs were also noted to be in place prior to the initiation of anesthesia.  The patient was then prepped and draped in the usual sterile fashion.   A surgical timeout was performed indicating the correct patient, procedure, positioning and need for preoperative antibiotics.   I began by incising the eschar around the ulcer using Bovie electrocautery. I debrided the tissue down to the level of healthy appearing tissue. The ulcer extended down to the coccyx. I sent a piece of the periosteum for wound cultures. The wound measured approximately 8 x 10 cm and approximately 6 cm deep.  Hemostasis was applied using electrocautery. The wound was then packed with a Kerlix fluff dressing. Patient tolerated this well and sent to postanesthesia care unit in stable condition. All counts were correct per operating room staff.

## 2016-06-12 NOTE — Transfer of Care (Signed)
Immediate Anesthesia Transfer of Care Note  Patient: [name]  Procedure(s) Performed: Procedure(s): IRRIGATION AND DEBRIDEMENT BUTTOCKS (N/A)  Patient Location: PACU  Anesthesia Type:General  Level of Consciousness: awake, alert , oriented and patient cooperative  Airway & Oxygen Therapy: Patient Spontanous Breathing and Patient connected to face mask oxygen  Post-op Assessment: Report given to RN and Post -op Vital signs reviewed and stable  Post vital signs: Reviewed and stable  Last Vitals:  Vitals:   06/12/16 0830 06/12/16 1249  BP: (!) 90/52 (!) 92/51  Pulse: 87 85  Resp: 17 17  Temp: 37.2 C 37 C    Last Pain:  Vitals:   06/12/16 1249  TempSrc: Oral  PainSc:       Patients Stated Pain Goal: 3 (17/35/67 0141)  Complications: No apparent anesthesia complications

## 2016-06-12 NOTE — Progress Notes (Signed)
Sepsis due to cellulitis ([place])  Subjective: Pt ready for operative debridement  Objective: Vital signs in last 24 hours: Temp:  [98.6 F (37 C)-101.8 F (38.8 C)] 98.6 F (37 C) (04/12 1249) Pulse Rate:  [81-95] 85 (04/12 1249) Resp:  [17-18] 17 (04/12 1249) BP: (90-104)/(51-56) 92/51 (04/12 1249) SpO2:  [94 %-100 %] 94 % (04/12 1249) Weight:  [103 kg (227 lb 1.2 oz)] 103 kg (227 lb 1.2 oz) (04/12 0526) Last BM Date: 06/10/16  Intake/Output from previous day: 04/11 0701 - 04/12 0700 In: 1080 [P.O.:480; IV Piggyback:600] Out: 3050 [Urine:3050] Intake/Output this shift: Total I/O In: 676.7 [I.V.:476.7; IV Piggyback:200] Out: 600 [Urine:600]  General appearance: alert and cooperative Resp: clear to auscultation bilaterally Cardio: regular rate and rhythm GI: normal findings: soft, non-tender  Lab Results:  Results for orders placed or performed during the hospital encounter of 06/10/16 (from the past 24 hour(s))  Glucose, capillary     Status: Abnormal   Collection Time: 06/11/16  5:40 PM  Result Value Ref Range   Glucose-Capillary 108 (H) 65 - 99 mg/dL  Glucose, capillary     Status: Abnormal   Collection Time: 06/11/16  9:41 PM  Result Value Ref Range   Glucose-Capillary 100 (H) 65 - 99 mg/dL  Magnesium     Status: None   Collection Time: 06/12/16  5:06 AM  Result Value Ref Range   Magnesium 1.8 1.7 - 2.4 mg/dL  Basic metabolic panel     Status: Abnormal   Collection Time: 06/12/16  5:06 AM  Result Value Ref Range   Sodium 139 135 - 145 mmol/L   Potassium 3.6 3.5 - 5.1 mmol/L   Chloride 103 101 - 111 mmol/L   CO2 27 22 - 32 mmol/L   Glucose, Bld 84 65 - 99 mg/dL   BUN 6 6 - 20 mg/dL   Creatinine, Ser 0.37 (L) 0.44 - 1.00 mg/dL   Calcium 8.1 (L) 8.9 - 10.3 mg/dL   GFR calc non Af Amer >60 >60 mL/min   GFR calc Af Amer >60 >60 mL/min   Anion gap 9 5 - 15  CBC     Status: Abnormal   Collection Time: 06/12/16  5:06 AM  Result Value Ref Range   WBC 11.3 (H)  4.0 - 10.5 K/uL   RBC 3.42 (L) 3.87 - 5.11 MIL/uL   Hemoglobin 8.7 (L) 12.0 - 15.0 g/dL   HCT 28.3 (L) 36.0 - 46.0 %   MCV 82.7 78.0 - 100.0 fL   MCH 25.4 (L) 26.0 - 34.0 pg   MCHC 30.7 30.0 - 36.0 g/dL   RDW 14.0 11.5 - 15.5 %   Platelets 503 (H) 150 - 400 K/uL  Glucose, capillary     Status: None   Collection Time: 06/12/16  8:00 AM  Result Value Ref Range   Glucose-Capillary 87 65 - 99 mg/dL  Surgical pcr screen     Status: None   Collection Time: 06/12/16  9:17 AM  Result Value Ref Range   MRSA, PCR NEGATIVE NEGATIVE   Staphylococcus aureus NEGATIVE NEGATIVE  Glucose, capillary     Status: None   Collection Time: 06/12/16 12:02 PM  Result Value Ref Range   Glucose-Capillary 95 65 - 99 mg/dL     Studies/Results Radiology     MEDS, Scheduled . [MAR Hold] cholecalciferol  2,000 Units Oral Daily  . [MAR Hold] citalopram  40 mg Oral Daily  . [MAR Hold] docusate sodium  100 mg Oral BID  . [  MAR Hold] enoxaparin (LOVENOX) injection  40 mg Subcutaneous Q24H  . [MAR Hold] gabapentin  600 mg Oral TID  . [MAR Hold] insulin aspart  0-5 Units Subcutaneous QHS  . [MAR Hold] insulin aspart  0-9 Units Subcutaneous TID WC  . [MAR Hold] levothyroxine  88 mcg Oral QAC breakfast  . [MAR Hold] pantoprazole  40 mg Oral Daily  . [MAR Hold] piperacillin-tazobactam (ZOSYN)  IV  3.375 g Intravenous Q8H  . [MAR Hold] polyethylene glycol  17 g Oral Daily  . [MAR Hold] protein supplement shake  11 oz Oral BID BM  . [MAR Hold] senna  1 tablet Oral BID  . [MAR Hold] vancomycin  1,000 mg Intravenous Q8H  . [MAR Hold] vitamin B-12  1,000 mcg Oral Daily     Assessment: Sepsis due to cellulitis ([place]) Sacral decubitus ulcer  Plan: OR for debridement.  Risks include bleeding, continued infection, need for additional surgery and chronic wound.  I believe she understands this and has agreed to proceed with surgery.  LOS: 2 days    Rosario Adie, MD [facility],  [place] [phone]   06/12/2016 1:16 PM

## 2016-06-12 NOTE — Anesthesia Procedure Notes (Signed)
Procedure Name: Intubation Date/Time: 06/12/2016 2:32 PM Performed by: Lollie Sails Pre-anesthesia Checklist: Patient identified, Emergency Drugs available, Suction available, Patient being monitored and Timeout performed Patient Re-evaluated:Patient Re-evaluated prior to inductionOxygen Delivery Method: Circle system utilized Preoxygenation: Pre-oxygenation with 100% oxygen Intubation Type: IV induction Ventilation: Mask ventilation without difficulty Laryngoscope Size: Mac and 4 Grade View: Grade I Tube type: Oral Tube size: 7.5 mm Number of attempts: 1 Airway Equipment and Method: Stylet Placement Confirmation: ETT inserted through vocal cords under direct vision,  positive ETCO2 and breath sounds checked- equal and bilateral Secured at: 22 cm Tube secured with: Tape Dental Injury: Teeth and Oropharynx as per pre-operative assessment

## 2016-06-12 NOTE — Anesthesia Postprocedure Evaluation (Addendum)
Anesthesia Post Note  Patient: [name]  Procedure(s) Performed: Procedure(s) (LRB): IRRIGATION AND DEBRIDEMENT BUTTOCKS (N/A)  Patient location during evaluation: PACU Anesthesia Type: General Level of consciousness: awake Pain management: pain level controlled Vital Signs Assessment: post-procedure vital signs reviewed and stable Respiratory status: spontaneous breathing Cardiovascular status: stable Postop Assessment: no signs of nausea or vomiting Anesthetic complications: no        Last Vitals:  Vitals:   06/12/16 1600 06/12/16 1614  BP: (!) 105/58 (!) 105/57  Pulse: 77 78  Resp: 12 16  Temp: 36.4 C 36.6 C    Last Pain:  Vitals:   06/12/16 1631  TempSrc:   PainSc: 8    Pain Goal: Patients Stated Pain Goal: 4 (06/12/16 1631)               [place]

## 2016-06-13 LAB — BASIC METABOLIC PANEL
ANION GAP: 6 (ref 5–15)
BUN: <5 mg/dL — ABNORMAL LOW (ref 6–20)
CALCIUM: 7.6 mg/dL — AB (ref 8.9–10.3)
CO2: 26 mmol/L (ref 22–32)
Chloride: 104 mmol/L (ref 101–111)
Creatinine, Ser: 0.31 mg/dL — ABNORMAL LOW (ref 0.44–1.00)
GFR calc non Af Amer: >60 mL/min (ref >60)
Glucose, Bld: 84 mg/dL (ref 65–99)
Potassium: 3.3 mmol/L — ABNORMAL LOW (ref 3.5–5.1)
Sodium: 136 mmol/L (ref 135–145)

## 2016-06-13 LAB — GLUCOSE, CAPILLARY: Glucose-Capillary: 86 mg/dL (ref 65–99)

## 2016-06-13 LAB — VANCOMYCIN, TROUGH: VANCOMYCIN TR: 15 ug/mL (ref 15–20)

## 2016-06-13 MED ORDER — POTASSIUM CHLORIDE CRYS ER 10 MEQ PO TBCR
30.0000 meq | EXTENDED_RELEASE_TABLET | Freq: Four times a day (QID) | ORAL | Status: AC
  Administered 2016-06-13 (×2): 30 meq via ORAL
  Filled 2016-06-13 (×2): qty 1

## 2016-06-13 MED ORDER — COLLAGENASE 250 UNIT/GM EX OINT
TOPICAL_OINTMENT | Freq: Every day | CUTANEOUS | Status: DC
  Administered 2016-06-14 – 2016-06-15 (×2): via TOPICAL
  Filled 2016-06-13: qty 30
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
## 2016-06-13 NOTE — Progress Notes (Signed)
[facility] Progress Note  1 Day Post-Op  Subjective: CC: pain 8/10  No feeling of fever, chills, nausea, vomiting overnight. Pain in buttock.  Objective: Vital signs in last 24 hours: Temp:  [97.6 F (36.4 C)-99.8 F (37.7 C)] 98.9 F (37.2 C) (04/13 0452) Pulse Rate:  [75-88] 75 (04/13 0452) Resp:  [12-20] 16 (04/13 0452) BP: (92-108)/(45-71) 103/52 (04/13 0452) SpO2:  [94 %-100 %] 97 % (04/13 0452) Last BM Date: 06/10/16  Intake/Output from previous day: 04/12 0701 - 04/13 0700 In: 4055 [P.O.:680; I.V.:2875; IV Piggyback:500] Out: 1195 [Urine:1170; Blood:25] Intake/Output this shift: Total I/O In: 400 [P.O.:400] Out: -   PE: Gen:  Alert, NAD, pleasant, cooperative, well appearing Pulm:  Rate and effort normal Skin: no rashes noted, warm and dry GU: sacral wound without purulent drainage, see photo below      Lab Results:   Recent Labs  06/11/16 0448 06/12/16 0506  WBC 11.4* 11.3*  HGB 8.5* 8.7*  HCT 27.0* 28.3*  PLT 471* 503*   BMET  Recent Labs  06/12/16 0506 06/13/16 0441  NA 139 136  K 3.6 3.3*  CL 103 104  CO2 27 26  GLUCOSE 84 84  BUN 6 <5*  CREATININE 0.37* 0.31*  CALCIUM 8.1* 7.6*   PT/INR  Recent Labs  06/10/16 2204  LABPROT 16.9*  INR 1.36   CMP     Component Value Date/Time   NA 136 06/13/2016 0441   K 3.3 (L) 06/13/2016 0441   CL 104 06/13/2016 0441   CO2 26 06/13/2016 0441   GLUCOSE 84 06/13/2016 0441   BUN <5 (L) 06/13/2016 0441   CREATININE 0.31 (L) 06/13/2016 0441   CREATININE 0.58 11/29/2015 1415   CALCIUM 7.6 (L) 06/13/2016 0441   PROT 7.1 06/10/2016 1418   ALBUMIN 2.7 (L) 06/10/2016 1418   AST 20 06/10/2016 1418   ALT 16 06/10/2016 1418   ALKPHOS 185 (H) 06/10/2016 1418   BILITOT 0.5 06/10/2016 1418   GFRNONAA >60 06/13/2016 0441   GFRNONAA >89 10/23/2014 0907   GFRAA >60 06/13/2016 0441   GFRAA >89 10/23/2014 0907   Lipase  No results found for: LIPASE     Studies/Results: No  results found.  Anti-infectives: Anti-infectives    Start     Dose/Rate Route Frequency Ordered Stop   06/12/16 1322  piperacillin-tazobactam (ZOSYN) 3.375 GM/50ML IVPB    Comments:  Whitlow, Cheryl   : cabinet override      06/12/16 1322 06/12/16 1434   06/11/16 0000  vancomycin (VANCOCIN) IVPB 1000 mg/200 mL premix     1,000 mg 200 mL/hr over 60 Minutes Intravenous Every 8 hours 06/10/16 1525     06/10/16 2200  piperacillin-tazobactam (ZOSYN) IVPB 3.375 g     3.375 g 12.5 mL/hr over 240 Minutes Intravenous Every 8 hours 06/10/16 1525     06/10/16 1530  piperacillin-tazobactam (ZOSYN) IVPB 3.375 g     3.375 g 100 mL/hr over 30 Minutes Intravenous  Once 06/10/16 1522 06/10/16 1620   06/10/16 1530  vancomycin (VANCOCIN) IVPB 1000 mg/200 mL premix     1,000 mg 200 mL/hr over 60 Minutes Intravenous  Once 06/10/16 1522 06/10/16 1650       Assessment/Plan Sepsis, suspect secondary to necrotic sacral ulcer with surrounding cellulitis  S/P IRRIGATION AND DEBRIDEMENT BUTTOCKS, Dr. Marcello Moores, 06/12/16 - wound culture growing gram (-) rods and gram (+) cocci in chains - blood cultures no growth in 2 days - continue IV abx   UTI  in the setting of neurogenic bladder - GROUP B STREP(S.AGALACTIAE)  Leukocytosis - trending down  Normocytic anemia - iron and ferritin are low, B12 and folate WNL Depression with anxiety - home medications, stable  Hypothyroidism - synthroid   FEN - reg diet ID - Vancomycin stopped, Zosyn 4/10 >>  VTE - SCD's   Plan - wound care BID wet to dry dressing changes. WOC consult pending    LOS: 3 days    Kalman Drape , [facility] 06/13/2016, 8:45 AM Pager: [phone] Consults: [phone] Mon-Fri 7:00 am-4:30 pm Sat-Sun 7:00 am-11:30 am
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
## 2016-06-13 NOTE — Progress Notes (Signed)
Pharmacy Antibiotic Note  Please see progress note from Reuel Boom, PharmD from earlier today for full details.  Vancomycin trough = 15 mcg/ml which is within goal of 15-20 mcg/ml  Plan: - continue vancomycin 1g IV q8h - continue to monitor renal function - recheck trough as needed  Peggyann Juba, PharmD, BCPS Pager: [phone] 06/13/2016 4:29 PM

## 2016-06-14 LAB — CBC
HCT: 23 % — ABNORMAL LOW (ref 36.0–46.0)
Hemoglobin: 7.2 g/dL — ABNORMAL LOW (ref 12.0–15.0)
MCH: 26 pg (ref 26.0–34.0)
MCHC: 31.3 g/dL (ref 30.0–36.0)
MCV: 83 fL (ref 78.0–100.0)
PLATELETS: 453 10*3/uL — AB (ref 150–400)
RBC: 2.77 MIL/uL — ABNORMAL LOW (ref 3.87–5.11)
RDW: 14.1 % (ref 11.5–15.5)
WBC: 6 10*3/uL (ref 4.0–10.5)

## 2016-06-14 LAB — BASIC METABOLIC PANEL
Anion gap: 6 (ref 5–15)
CHLORIDE: 107 mmol/L (ref 101–111)
CO2: 28 mmol/L (ref 22–32)
CREATININE: 0.43 mg/dL — AB (ref 0.44–1.00)
Calcium: 7.9 mg/dL — ABNORMAL LOW (ref 8.9–10.3)
GFR calc Af Amer: >60 mL/min (ref >60)
Glucose, Bld: 88 mg/dL (ref 65–99)
Potassium: 3.6 mmol/L (ref 3.5–5.1)
SODIUM: 141 mmol/L (ref 135–145)

## 2016-06-14 LAB — GLUCOSE, CAPILLARY: Glucose-Capillary: 105 mg/dL — ABNORMAL HIGH (ref 65–99)

## 2016-06-14 MED ORDER — MAGNESIUM SULFATE 2 GM/50ML IV SOLN
2.0000 g | Freq: Once | INTRAVENOUS | Status: AC
  Administered 2016-06-14: 2 g via INTRAVENOUS
  Filled 2016-06-14: qty 50

## 2016-06-14 MED ORDER — FUROSEMIDE 10 MG/ML IJ SOLN
40.0000 mg | Freq: Once | INTRAMUSCULAR | Status: AC
  Administered 2016-06-14: 40 mg via INTRAVENOUS
  Filled 2016-06-14: qty 4

## 2016-06-14 MED ORDER — POTASSIUM CHLORIDE CRYS ER 20 MEQ PO TBCR
30.0000 meq | EXTENDED_RELEASE_TABLET | Freq: Four times a day (QID) | ORAL | Status: AC
  Administered 2016-06-14 (×2): 30 meq via ORAL
  Filled 2016-06-14 (×2): qty 1
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
## 2016-06-14 NOTE — Progress Notes (Signed)
2 Days Post-Op  Subjective: Complains of pain at hip and back  Objective: Vital signs in last 24 hours: Temp:  [97.8 F (36.6 C)-98.9 F (37.2 C)] 98.6 F (37 C) (04/14 0510) Pulse Rate:  [65-74] 74 (04/14 0510) Resp:  [16-18] 18 (04/14 0510) BP: (87-111)/(43-64) 99/63 (04/14 0510) SpO2:  [95 %-98 %] 95 % (04/14 0510) Last BM Date: 06/13/16  Intake/Output from previous day: 04/13 0701 - 04/14 0700 In: 4931.7 [P.O.:2990; I.V.:1191.7; IV Piggyback:750] Out: 4170 [Urine:4170] Intake/Output this shift: No intake/output data recorded.  General appearance: alert and cooperative Resp: clear to auscultation bilaterally Cardio: regular rate and rhythm GI: soft, nontender. good bs and having bm's  Lab Results:   Recent Labs  06/12/16 0506 06/14/16 0452  WBC 11.3* 6.0  HGB 8.7* 7.2*  HCT 28.3* 23.0*  PLT 503* 453*   BMET  Recent Labs  06/13/16 0441 06/14/16 0452  NA 136 141  K 3.3* 3.6  CL 104 107  CO2 26 28  GLUCOSE 84 88  BUN <5* <5*  CREATININE 0.31* 0.43*  CALCIUM 7.6* 7.9*   PT/INR No results for input(s): LABPROT, INR in the last 72 hours. ABG No results for input(s): PHART, HCO3 in the last 72 hours.  Invalid input(s): PCO2, PO2  Studies/Results: No results found.  Anti-infectives: Anti-infectives    Start     Dose/Rate Route Frequency Ordered Stop   06/12/16 1322  piperacillin-tazobactam (ZOSYN) 3.375 GM/50ML IVPB    Comments:  [name], Jamie   : cabinet override      06/12/16 1322 06/12/16 1434   06/11/16 0000  vancomycin (VANCOCIN) IVPB 1000 mg/200 mL premix     1,000 mg 200 mL/hr over 60 Minutes Intravenous Every 8 hours 06/10/16 1525     06/10/16 2200  piperacillin-tazobactam (ZOSYN) IVPB 3.375 g     3.375 g 12.5 mL/hr over 240 Minutes Intravenous Every 8 hours 06/10/16 1525     06/10/16 1530  piperacillin-tazobactam (ZOSYN) IVPB 3.375 g     3.375 g 100 mL/hr over 30 Minutes Intravenous  Once 06/10/16 1522 06/10/16 1620   06/10/16 1530   vancomycin (VANCOCIN) IVPB 1000 mg/200 mL premix     1,000 mg 200 mL/hr over 60 Minutes Intravenous  Once 06/10/16 1522 06/10/16 1650      Assessment/Plan: s/p Procedure(s): IRRIGATION AND DEBRIDEMENT BUTTOCKS (N/A) Advance diet  Continue vanc and zosyn Continue dressing changes. Will check again on monday  LOS: 4 days    [name],[name] 06/14/2016

## 2016-06-15 DIAGNOSIS — Z9889 Other specified postprocedural states: Secondary | ICD-10-CM

## 2016-06-15 DIAGNOSIS — F17210 Nicotine dependence, cigarettes, uncomplicated: Secondary | ICD-10-CM

## 2016-06-15 DIAGNOSIS — Z8249 Family history of ischemic heart disease and other diseases of the circulatory system: Secondary | ICD-10-CM

## 2016-06-15 DIAGNOSIS — M4628 Osteomyelitis of vertebra, sacral and sacrococcygeal region: Secondary | ICD-10-CM

## 2016-06-15 LAB — BASIC METABOLIC PANEL
Anion gap: 10 (ref 5–15)
BUN: <5 mg/dL — ABNORMAL LOW (ref 6–20)
CHLORIDE: 101 mmol/L (ref 101–111)
CO2: 27 mmol/L (ref 22–32)
CREATININE: 0.47 mg/dL (ref 0.44–1.00)
Calcium: 7.9 mg/dL — ABNORMAL LOW (ref 8.9–10.3)
GFR calc Af Amer: >60 mL/min (ref >60)
GFR calc non Af Amer: >60 mL/min (ref >60)
GLUCOSE: 81 mg/dL (ref 65–99)
POTASSIUM: 3.7 mmol/L (ref 3.5–5.1)
Sodium: 138 mmol/L (ref 135–145)

## 2016-06-15 LAB — CBC
HEMATOCRIT: 25.7 % — AB (ref 36.0–46.0)
Hemoglobin: 8.1 g/dL — ABNORMAL LOW (ref 12.0–15.0)
MCH: 26.6 pg (ref 26.0–34.0)
MCHC: 31.5 g/dL (ref 30.0–36.0)
MCV: 84.3 fL (ref 78.0–100.0)
PLATELETS: 527 10*3/uL — AB (ref 150–400)
RBC: 3.05 MIL/uL — ABNORMAL LOW (ref 3.87–5.11)
RDW: 14.5 % (ref 11.5–15.5)
WBC: 8.9 10*3/uL (ref 4.0–10.5)

## 2016-06-15 LAB — CULTURE, BLOOD (ROUTINE X 2)
CULTURE: NO GROWTH
Culture: NO GROWTH
Special Requests: ADEQUATE
Special Requests: ADEQUATE

## 2016-06-15 LAB — VANCOMYCIN, TROUGH: Vancomycin Tr: 20 ug/mL (ref 15–20)

## 2016-06-15 LAB — MAGNESIUM: Magnesium: 2.1 mg/dL (ref 1.7–2.4)

## 2016-06-15 MED ORDER — VANCOMYCIN HCL IN DEXTROSE 750-5 MG/150ML-% IV SOLN
750.0000 mg | Freq: Three times a day (TID) | INTRAVENOUS | Status: DC
  Administered 2016-06-15 – 2016-06-16 (×3): 750 mg via INTRAVENOUS
  Filled 2016-06-15 (×3): qty 150

## 2016-06-15 MED ORDER — POTASSIUM CHLORIDE CRYS ER 20 MEQ PO TBCR
40.0000 meq | EXTENDED_RELEASE_TABLET | Freq: Four times a day (QID) | ORAL | Status: AC
  Administered 2016-06-15 (×2): 40 meq via ORAL
  Filled 2016-06-15 (×3): qty 2

## 2016-06-15 MED ORDER — SODIUM CHLORIDE 0.9 % IV SOLN
INTRAVENOUS | Status: DC
  Administered 2016-06-15 – 2016-06-16 (×2): via INTRAVENOUS

## 2016-06-15 MED ORDER — SODIUM CHLORIDE 0.9 % IV BOLUS (SEPSIS)
500.0000 mL | Freq: Once | INTRAVENOUS | Status: AC
  Administered 2016-06-16: 500 mL via INTRAVENOUS

## 2016-06-15 MED ORDER — FUROSEMIDE 10 MG/ML IJ SOLN
40.0000 mg | Freq: Once | INTRAMUSCULAR | Status: AC
  Administered 2016-06-15: 40 mg via INTRAVENOUS
  Filled 2016-06-15: qty 4
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
## 2016-06-15 NOTE — Progress Notes (Signed)
Pharmacy: Re- vancomycin  Patient's a 36 y.o paraplegic F currently on vancomycin for sacral wound infection (s/p I&D) and suspected osteomyelitis.  Scr trending up to 0.47 today. Vancomycin trough level was at 15 (goal 15-20) on 4/13 and today's level now back as 20-- therapeutic  but is at the upper end of therapeutic range. Will anticipate drug to accumulate and level will be greater than 20 if to continue at current regimen.   Plan: - decrease dose to 750 mg IV q8h - monitor renal function  Dia Sitter, PharmD, BCPS 06/15/2016 4:32 PM
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
## 2016-06-15 NOTE — Progress Notes (Signed)
3 Days Post-Op  Subjective: Complains of pain in back  Objective: Vital signs in last 24 hours: Temp:  [98.3 F (36.8 C)-99.4 F (37.4 C)] 99.4 F (37.4 C) (04/15 0440) Pulse Rate:  [72-75] 72 (04/15 0440) Resp:  [16-20] 20 (04/15 0440) BP: (95-110)/(53-69) 99/53 (04/15 0440) SpO2:  [96 %-99 %] 99 % (04/15 0440) Last BM Date: 06/14/16  Intake/Output from previous day: 04/14 0701 - 04/15 0700 In: 3080 [P.O.:2280; IV Piggyback:700] Out: 6200 [Urine:6200] Intake/Output this shift: No intake/output data recorded.  General appearance: alert and cooperative Resp: clear to auscultation bilaterally Cardio: regular rate and rhythm GI: soft, nontender  Lab Results:   Recent Labs  06/14/16 0452 06/15/16 0426  WBC 6.0 8.9  HGB 7.2* 8.1*  HCT 23.0* 25.7*  PLT 453* 527*   BMET  Recent Labs  06/14/16 0452 06/15/16 0426  NA 141 138  K 3.6 3.7  CL 107 101  CO2 28 27  GLUCOSE 88 81  BUN <5* <5*  CREATININE 0.43* 0.47  CALCIUM 7.9* 7.9*   PT/INR No results for input([name]): LABPROT, INR in the last 72 hours. ABG No results for input([name]): PHART, HCO3 in the last 72 hours.  Invalid input([name]): PCO2, PO2  Studies/Results: No results found.  Anti-infectives: Anti-infectives    Start     Dose/Rate Route Frequency Ordered Stop   06/12/16 1322  piperacillin-tazobactam (ZOSYN) 3.375 GM/50ML IVPB    Comments:  [name], Jamie   : cabinet override      06/12/16 1322 06/12/16 1434   06/11/16 0000  vancomycin (VANCOCIN) IVPB 1000 mg/200 mL premix     1,000 mg 200 mL/hr over 60 Minutes Intravenous Every 8 hours 06/10/16 1525     06/10/16 2200  piperacillin-tazobactam (ZOSYN) IVPB 3.375 g     3.375 g 12.5 mL/hr over 240 Minutes Intravenous Every 8 hours 06/10/16 1525     06/10/16 1530  piperacillin-tazobactam (ZOSYN) IVPB 3.375 g     3.375 g 100 mL/hr over 30 Minutes Intravenous  Once 06/10/16 1522 06/10/16 1620   06/10/16 1530  vancomycin (VANCOCIN) IVPB 1000 mg/200 mL premix      1,000 mg 200 mL/hr over 60 Minutes Intravenous  Once 06/10/16 1522 06/10/16 1650      Assessment/Plan: [name] Procedure([name]): IRRIGATION AND DEBRIDEMENT BUTTOCKS (N/A) continue dressing changes and abx  No new recs  LOS: 5 days    [name],Jamie [name] 06/15/2016

## 2016-06-16 DIAGNOSIS — B9689 Other specified bacterial agents as the cause of diseases classified elsewhere: Secondary | ICD-10-CM

## 2016-06-16 DIAGNOSIS — M869 Osteomyelitis, unspecified: Secondary | ICD-10-CM

## 2016-06-16 DIAGNOSIS — B954 Other streptococcus as the cause of diseases classified elsewhere: Secondary | ICD-10-CM

## 2016-06-16 DIAGNOSIS — M4628 Osteomyelitis of vertebra, sacral and sacrococcygeal region: Secondary | ICD-10-CM

## 2016-06-16 DIAGNOSIS — Z95828 Presence of other vascular implants and grafts: Secondary | ICD-10-CM

## 2016-06-16 LAB — BASIC METABOLIC PANEL
Anion gap: 7 (ref 5–15)
BUN: 9 mg/dL (ref 6–20)
CHLORIDE: 101 mmol/L (ref 101–111)
CO2: 29 mmol/L (ref 22–32)
CREATININE: 0.58 mg/dL (ref 0.44–1.00)
Calcium: 8 mg/dL — ABNORMAL LOW (ref 8.9–10.3)
Glucose, Bld: 88 mg/dL (ref 65–99)
Potassium: 4 mmol/L (ref 3.5–5.1)
SODIUM: 137 mmol/L (ref 135–145)

## 2016-06-16 LAB — AEROBIC/ANAEROBIC CULTURE W GRAM STAIN (SURGICAL/DEEP WOUND)

## 2016-06-16 LAB — AEROBIC/ANAEROBIC CULTURE (SURGICAL/DEEP WOUND)

## 2016-06-16 MED ORDER — HEPARIN SOD (PORK) LOCK FLUSH 100 UNIT/ML IV SOLN
250.0000 [IU] | Freq: Every day | INTRAVENOUS | Status: DC
  Filled 2016-06-16: qty 3

## 2016-06-16 MED ORDER — VANCOMYCIN HCL 10 G IV SOLR
1250.0000 mg | Freq: Two times a day (BID) | INTRAVENOUS | Status: DC
  Filled 2016-06-16: qty 1250

## 2016-06-16 MED ORDER — OXYCODONE HCL ER 10 MG PO T12A: 10.0000 mg | EXTENDED_RELEASE_TABLET | Freq: Two times a day (BID) | ORAL | 0 refills | Status: DC

## 2016-06-16 MED ORDER — SODIUM CHLORIDE 0.9% FLUSH: 10.0000 mL | INTRAVENOUS | Status: DC | PRN

## 2016-06-16 MED ORDER — METRONIDAZOLE 500 MG PO TABS: 500.0000 mg | ORAL_TABLET | Freq: Three times a day (TID) | ORAL | Status: DC

## 2016-06-16 MED ORDER — HEPARIN SOD (PORK) LOCK FLUSH 100 UNIT/ML IV SOLN
250.0000 [IU] | INTRAVENOUS | Status: DC | PRN
  Administered 2016-06-16: 250 [IU]
  Filled 2016-06-16: qty 3

## 2016-06-16 MED ORDER — DEXTROSE 5 % IV SOLN: 2.0000 g | INTRAVENOUS | Status: DC

## 2016-06-16 MED ORDER — OXYCODONE HCL 10 MG PO TABS: 10.0000 mg | ORAL_TABLET | ORAL | 0 refills | Status: DC | PRN

## 2016-06-16 MED ORDER — DEXTROSE 5 % IV SOLN
2.0000 g | INTRAVENOUS | Status: DC
  Administered 2016-06-16: 2 g via INTRAVENOUS
  Filled 2016-06-16: qty 2

## 2016-06-16 MED ORDER — METRONIDAZOLE 500 MG PO TABS
500.0000 mg | ORAL_TABLET | Freq: Three times a day (TID) | ORAL | Status: DC
  Administered 2016-06-16: 500 mg via ORAL
  Filled 2016-06-16: qty 1

## 2016-06-16 NOTE — Clinical Social Work Placement (Signed)
   CLINICAL SOCIAL WORK PLACEMENT  NOTE  Date:  06/16/2016  Patient Details  Name: [name] MRN: [id number] Date of Birth: 04/12/1980  Clinical Social Work is seeking post-discharge placement for this patient at the [place] level of care (*CSW will initial, date and re-position this form in  chart as items are completed):  Yes   Patient/family provided with [facility] Work Department's list of facilities offering this level of care within the geographic area requested by the patient (or if unable, by the patient's family).  Yes   Patient/family informed of their freedom to choose among providers that offer the needed level of care, that participate in Medicare, Medicaid or managed care program needed by the patient, have an available bed and are willing to accept the patient.  Yes   Patient/family informed of [facility]'s ownership interest in [facility] and [facility], as well as of the fact that they are under no obligation to receive care at these facilities.  PASRR submitted to EDS on       PASRR number received on       Existing PASRR number confirmed on 06/16/16     FL2 transmitted to all facilities in geographic area requested by pt/family on       FL2 transmitted to all facilities within larger geographic area on       Patient informed that his/her managed care company has contracts with or will negotiate with certain facilities, including the following:            Patient/family informed of bed offers received.  Patient chooses bed at [facility]     Physician recommends and patient chooses bed at      Patient to be transferred to [facility] on  .  Patient to be transferred to facility by [facility]     Patient family notified on   of transfer.  Name of family member notified:  Mother at Bedside/ Patient is returning to [facility].      PHYSICIAN Please prepare priority discharge summary, including  medications     Additional Comment:    _______________________________________________ Lia Hopping, LCSW 06/16/2016, 12:09 PM

## 2016-06-16 NOTE — Progress Notes (Signed)
Discharge instructions discussed with patientand also called to [facility] at [facility]

## 2016-06-16 NOTE — Progress Notes (Signed)
Pharmacy Antibiotic Note  [name] is a 36 y.o. female admitted on 06/10/2016 with sepsis secondary to sacral wound infection s/p I&D 4/13. [name] been consulted for Vancomycin and Zosyn dosing.  Today, 06/16/2016: Day #6/42 broad spectrum antibiotics  Plan: 1) Patient to be discharged home on abxs per ID of IV Vanc/IV Rocephin/PO Flagyl for osteomyelitis. Confirmed with ID that abx's to continue x 6 weeks - End date would therefore be May 22 2) In order for patient to better able to administer vanc at home, will change Vanc from 750mg  IV q8 to 1250mg  IV q12. Recheck vanc trough prior to 6th dose   Height: 5\' 6"  (167.6 cm) Weight: 227 lb 1.2 oz (103 kg) IBW/kg (Calculated) : 59.3  Temp (24hrs), Avg:98.4 F (36.9 C), Min:98.3 F (36.8 C), Max:98.4 F (36.9 C)   Recent Labs Lab 06/10/16 1418 06/10/16 1433 06/10/16 1758 06/10/16 2204 06/11/16 0448 06/12/16 0506 06/13/16 0441 06/13/16 1532 06/14/16 0452 06/15/16 0426 06/15/16 1511 06/16/16 0420  WBC 16.7*  --   --   --  11.4* 11.3*  --   --  6.0 8.9  --   --   CREATININE 0.42*  --   --   --  0.41* 0.37* 0.31*  --  0.43* 0.47  --  0.58  LATICACIDVEN  --  2.08* 1.34 1.0  --   --   --   --   --   --   --   --   VANCOTROUGH  --   --   --   --   --   --   --  15  --   --  20  --     Estimated Creatinine Clearance: 120.1 mL/min (by C-G formula based on SCr of 0.58 mg/dL).    No Known Allergies  Antimicrobials this admission: 4/10 >> Vancomycin >> 4/10 >> Zosyn >> 4/16 4/16 >> Rocephin/Flagyl >>   Dose adjustments this admission: 4/13 at 1530 VT = 15 before 8th dose of vanc 1000 q8 (note missed dose yesterday) - no change 4/15 1530 VT = mcg/mL on 1gm q8h (no missed doses)   Microbiology results: 4/10 BCx: NGTD 4/10 UCx: > 100k GBS (usu PCN sens) - poor quality sample, chronic foley until recently 4/12: Surgical tissue Cx: rare GNR; rare GPC chains. Re-incubated for possible anaerobe  Thank you for  allowing pharmacy to be a part of this patient's care.  Adrian Saran, PharmD, BCPS Pager [phone] 06/16/2016 10:18 AM

## 2016-06-16 NOTE — Progress Notes (Signed)
CSW met with patient and mother at bedside. Patient express concerns about her care at [facility]. Patient inquired about going to another facuility. Patient reports she has to be able to smoke at the facility. CSW presented other bed offer, patient reports the facility is too far. Patient says she is agreeable to return to [facility]. CSW updated facility admission coordinator. Facility is ordering air mattress bed.  Will continue to assist with d/c summary.   Kathrin Greathouse, Latanya Presser, MSW Clinical Social Worker [place] [facility] [phone] 06/16/2016  11:32 AM

## 2016-06-16 NOTE — Progress Notes (Signed)
PHARMACY CONSULT NOTE FOR:  OUTPATIENT  PARENTERAL ANTIBIOTIC THERAPY (OPAT)  Indication: osteomyelitis, wound Regimen: IV Vancomycin, IV Rocephin, PO Flagyl End date: 07/22/16  IV antibiotic discharge orders are pended. To discharging provider:  please sign these orders via discharge navigator,  Select New Orders & click on the button choice - Manage This Unsigned Work.     Thank you for allowing pharmacy to be a part of this patient's care.  Kara Mead 06/16/2016, 10:30 AM

## 2016-06-16 NOTE — Progress Notes (Signed)
Peripherally Inserted Central Catheter/Midline Placement  The IV Nurse has discussed with the patient and/or persons authorized to consent for the patient, the purpose of this procedure and the potential benefits and risks involved with this procedure.  The benefits include less needle sticks, lab draws from the catheter, and the patient may be discharged home with the catheter. Risks include, but not limited to, infection, bleeding, blood clot (thrombus formation), and puncture of an artery; nerve damage and irregular heartbeat and possibility to perform a PICC exchange if needed/ordered by physician.  Alternatives to this procedure were also discussed.  Bard Power PICC patient education guide, fact sheet on infection prevention and patient information card has been provided to patient /or left at bedside.    PICC/Midline Placement Documentation        [name] 06/16/2016, 10:31 AM
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
## 2016-06-16 NOTE — Progress Notes (Signed)
Pt has bp of 87/47. Notified provider on call. Received orde for a 500 cc bolus.

## 2016-06-16 NOTE — Progress Notes (Signed)
[facility] for Infectious Disease   Reason for visit: Follow up on osteomyelitis  Interval History: growth with Strep anginosis and mixed anaerobes on bone culture  Physical Exam: Constitutional:  Vitals:   06/15/16 1356 06/15/16 2104  BP: 113/68 (!) 87/47  Pulse: 72 71  Resp: 18 18  Temp: 98.3 F (36.8 C) 98.4 F (36.9 C)   patient appears in NAD  Impression: Stable osteo  Plan: 1.  Can stop vancomycin 2.  6 weeks of IV ceftriaxone and oral flagyl with end date of 07/22/16 3.  We will arrange follow up with [place] in 4-5 weeks 4.  Ok to pull the picc line at the end of treatment on 07/22/16 5.  Labs and antibiotics per SNF protocol 6.  Please check a CRP and ESR in 4 weeks

## 2016-06-16 NOTE — Progress Notes (Signed)
[facility] called for transport.  Rn notified.   Kathrin Greathouse, Latanya Presser, MSW Clinical Social Worker 5E and Psychiatric Service Line [phone] 06/16/2016  3:04 PM

## 2016-06-19 ENCOUNTER — Ambulatory Visit: Payer: Medicaid Other | Admitting: Internal Medicine

## 2016-06-27 ENCOUNTER — Encounter (HOSPITAL_BASED_OUTPATIENT_CLINIC_OR_DEPARTMENT_OTHER): Payer: Self-pay

## 2016-06-27 ENCOUNTER — Encounter (HOSPITAL_BASED_OUTPATIENT_CLINIC_OR_DEPARTMENT_OTHER): Payer: Medicaid Other | Attending: Internal Medicine

## 2016-06-27 DIAGNOSIS — L89154 Pressure ulcer of sacral region, stage 4: Secondary | ICD-10-CM | POA: Diagnosis present

## 2016-06-27 DIAGNOSIS — L89623 Pressure ulcer of left heel, stage 3: Secondary | ICD-10-CM | POA: Diagnosis not present

## 2016-06-27 DIAGNOSIS — G8222 Paraplegia, incomplete: Secondary | ICD-10-CM | POA: Insufficient documentation

## 2016-06-27 DIAGNOSIS — M4628 Osteomyelitis of vertebra, sacral and sacrococcygeal region: Secondary | ICD-10-CM | POA: Diagnosis not present

## 2016-07-09 ENCOUNTER — Encounter (HOSPITAL_COMMUNITY): Payer: Self-pay | Admitting: Emergency Medicine

## 2016-07-09 ENCOUNTER — Emergency Department (HOSPITAL_COMMUNITY): Payer: Medicaid Other

## 2016-07-09 ENCOUNTER — Emergency Department (HOSPITAL_COMMUNITY)
Admission: EM | Admit: 2016-07-09 | Discharge: 2016-07-10 | Disposition: A | Discharge status: Home / Self Care | Payer: Medicaid Other | Attending: Emergency Medicine | Admitting: Emergency Medicine

## 2016-07-09 DIAGNOSIS — F17210 Nicotine dependence, cigarettes, uncomplicated: Secondary | ICD-10-CM | POA: Diagnosis not present

## 2016-07-09 DIAGNOSIS — E039 Hypothyroidism, unspecified: Secondary | ICD-10-CM | POA: Insufficient documentation

## 2016-07-09 DIAGNOSIS — R1011 Right upper quadrant pain: Secondary | ICD-10-CM | POA: Diagnosis present

## 2016-07-09 DIAGNOSIS — K8020 Calculus of gallbladder without cholecystitis without obstruction: Secondary | ICD-10-CM | POA: Diagnosis not present

## 2016-07-09 DIAGNOSIS — R0602 Shortness of breath: Secondary | ICD-10-CM | POA: Diagnosis not present

## 2016-07-09 DIAGNOSIS — Z79899 Other long term (current) drug therapy: Secondary | ICD-10-CM | POA: Insufficient documentation

## 2016-07-09 HISTORY — DX: Urinary tract infection, site not specified: N39.0

## 2016-07-09 HISTORY — DX: Flaccid neuropathic bladder, not elsewhere classified: N31.2

## 2016-07-09 HISTORY — DX: Polyneuropathy, unspecified: G62.9

## 2016-07-09 HISTORY — DX: Iron deficiency anemia, unspecified: D50.9

## 2016-07-09 HISTORY — DX: Hypo-osmolality and hyponatremia: E87.1

## 2016-07-09 HISTORY — DX: Osteomyelitis of vertebra, sacral and sacrococcygeal region: M46.28

## 2016-07-09 HISTORY — DX: Anxiety disorder, unspecified: F41.9

## 2016-07-09 HISTORY — DX: Neurogenic bowel, not elsewhere classified: K59.2

## 2016-07-09 HISTORY — DX: Other symbolic dysfunctions: R48.8

## 2016-07-09 HISTORY — DX: Pressure ulcer of unspecified site, unspecified stage: L89.90

## 2016-07-09 HISTORY — DX: Gastro-esophageal reflux disease without esophagitis: K21.9

## 2016-07-09 HISTORY — DX: Other muscle spasm: M62.838

## 2016-07-09 MED ORDER — HYDROMORPHONE HCL 1 MG/ML IJ SOLN
1.0000 mg | Freq: Once | INTRAMUSCULAR | Status: AC
Start: 2016-07-09 — End: 2016-07-10
  Administered 2016-07-10: 1 mg via INTRAVENOUS
  Filled 2016-07-09: qty 1

## 2016-07-09 MED ORDER — SODIUM CHLORIDE 0.9 % IV BOLUS (SEPSIS)
1000.0000 mL | Freq: Once | INTRAVENOUS | Status: AC
  Administered 2016-07-10: 1000 mL via INTRAVENOUS

## 2016-07-09 MED ORDER — PROMETHAZINE HCL 25 MG/ML IJ SOLN
12.5000 mg | Freq: Once | INTRAMUSCULAR | Status: AC
  Administered 2016-07-10: 12.5 mg via INTRAVENOUS
  Filled 2016-07-09: qty 1
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
## 2016-07-10 LAB — CBC WITH DIFFERENTIAL/PLATELET
BASOS ABS: 0 10*3/uL (ref 0.0–0.1)
Basophils Relative: 0 %
Eosinophils Absolute: 0.6 10*3/uL (ref 0.0–0.7)
Eosinophils Relative: 5 %
HEMATOCRIT: 32 % — AB (ref 36.0–46.0)
Hemoglobin: 10.1 g/dL — ABNORMAL LOW (ref 12.0–15.0)
LYMPHS PCT: 25 %
Lymphs Abs: 2.8 10*3/uL (ref 0.7–4.0)
MCH: 26.5 pg (ref 26.0–34.0)
MCHC: 31.6 g/dL (ref 30.0–36.0)
MCV: 84 fL (ref 78.0–100.0)
Monocytes Absolute: 0.8 10*3/uL (ref 0.1–1.0)
Monocytes Relative: 7 %
Neutro Abs: 7 10*3/uL (ref 1.7–7.7)
Neutrophils Relative %: 63 %
PLATELETS: 351 10*3/uL (ref 150–400)
RBC: 3.81 MIL/uL — AB (ref 3.87–5.11)
RDW: 16.2 % — ABNORMAL HIGH (ref 11.5–15.5)
WBC: 11.2 10*3/uL — AB (ref 4.0–10.5)

## 2016-07-10 LAB — COMPREHENSIVE METABOLIC PANEL
ALT: 8 U/L — ABNORMAL LOW (ref 14–54)
ANION GAP: 8 (ref 5–15)
AST: 14 U/L — AB (ref 15–41)
Albumin: 2.7 g/dL — ABNORMAL LOW (ref 3.5–5.0)
Alkaline Phosphatase: 56 U/L (ref 38–126)
BUN: 5 mg/dL — ABNORMAL LOW (ref 6–20)
CHLORIDE: 98 mmol/L — AB (ref 101–111)
CO2: 28 mmol/L (ref 22–32)
Calcium: 8.2 mg/dL — ABNORMAL LOW (ref 8.9–10.3)
Creatinine, Ser: <0.3 mg/dL — ABNORMAL LOW (ref 0.44–1.00)
Glucose, Bld: 89 mg/dL (ref 65–99)
POTASSIUM: 3.4 mmol/L — AB (ref 3.5–5.1)
Sodium: 134 mmol/L — ABNORMAL LOW (ref 135–145)
Total Bilirubin: 0.3 mg/dL (ref 0.3–1.2)
Total Protein: 6.2 g/dL — ABNORMAL LOW (ref 6.5–8.1)

## 2016-07-10 LAB — LIPASE, BLOOD: LIPASE: 16 U/L (ref 11–51)

## 2016-07-10 LAB — POC URINE PREG, ED: Preg Test, Ur: NEGATIVE

## 2016-07-10 MED ORDER — IOPAMIDOL (ISOVUE-300) INJECTION 61%
100.0000 mL | Freq: Once | INTRAVENOUS | Status: AC | PRN
  Administered 2016-07-10: 100 mL via INTRAVENOUS

## 2016-07-10 NOTE — ED Notes (Signed)
Unable to reassess pain due to being out of the departtment for CT

## 2016-07-11 ENCOUNTER — Encounter (HOSPITAL_BASED_OUTPATIENT_CLINIC_OR_DEPARTMENT_OTHER): Payer: Medicaid Other | Attending: Internal Medicine

## 2016-07-11 ENCOUNTER — Other Ambulatory Visit (HOSPITAL_COMMUNITY)
Admission: RE | Admit: 2016-07-11 | Discharge: 2016-07-11 | Disposition: A | Discharge status: Home / Self Care | Payer: Medicaid Other | Source: Other Acute Inpatient Hospital | Attending: Nurse Practitioner | Admitting: Nurse Practitioner

## 2016-07-11 DIAGNOSIS — G8222 Paraplegia, incomplete: Secondary | ICD-10-CM | POA: Insufficient documentation

## 2016-07-11 DIAGNOSIS — L89154 Pressure ulcer of sacral region, stage 4: Secondary | ICD-10-CM | POA: Insufficient documentation

## 2016-07-11 DIAGNOSIS — L89624 Pressure ulcer of left heel, stage 4: Secondary | ICD-10-CM | POA: Insufficient documentation

## 2016-07-12 ENCOUNTER — Encounter (HOSPITAL_COMMUNITY): Payer: Self-pay | Admitting: Emergency Medicine

## 2016-07-12 ENCOUNTER — Emergency Department (HOSPITAL_COMMUNITY)
Admission: EM | Admit: 2016-07-12 | Discharge: 2016-07-13 | Disposition: A | Discharge status: Home / Self Care | Payer: Medicaid Other | Attending: Emergency Medicine | Admitting: Emergency Medicine

## 2016-07-12 DIAGNOSIS — D75839 Thrombocytosis, unspecified: Secondary | ICD-10-CM

## 2016-07-12 DIAGNOSIS — G8220 Paraplegia, unspecified: Secondary | ICD-10-CM

## 2016-07-12 DIAGNOSIS — K8050 Calculus of bile duct without cholangitis or cholecystitis without obstruction: Secondary | ICD-10-CM | POA: Diagnosis not present

## 2016-07-12 DIAGNOSIS — Z79899 Other long term (current) drug therapy: Secondary | ICD-10-CM | POA: Diagnosis not present

## 2016-07-12 DIAGNOSIS — D473 Essential (hemorrhagic) thrombocythemia: Secondary | ICD-10-CM | POA: Insufficient documentation

## 2016-07-12 DIAGNOSIS — D500 Iron deficiency anemia secondary to blood loss (chronic): Secondary | ICD-10-CM | POA: Insufficient documentation

## 2016-07-12 DIAGNOSIS — D649 Anemia, unspecified: Secondary | ICD-10-CM

## 2016-07-12 DIAGNOSIS — F17210 Nicotine dependence, cigarettes, uncomplicated: Secondary | ICD-10-CM | POA: Diagnosis not present

## 2016-07-12 DIAGNOSIS — R112 Nausea with vomiting, unspecified: Secondary | ICD-10-CM

## 2016-07-12 DIAGNOSIS — R1011 Right upper quadrant pain: Secondary | ICD-10-CM | POA: Diagnosis present

## 2016-07-12 NOTE — ED Triage Notes (Signed)
Per EMS patient from [facility]. Patient complains of abdominal pain and vomiting. Report from nurse at [facility] patient vomited up her usual pain medication and PO zofran. Patient complains of back pain and right sided abdominal pain.  Patient given 4mg  zofran IV by EMS.
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
## 2016-07-13 LAB — CBC WITH DIFFERENTIAL/PLATELET
Basophils Absolute: 0 10*3/uL (ref 0.0–0.1)
Basophils Relative: 0 %
EOS ABS: 0.5 10*3/uL (ref 0.0–0.7)
Eosinophils Relative: 4 %
HEMATOCRIT: 34.8 % — AB (ref 36.0–46.0)
HEMOGLOBIN: 11.1 g/dL — AB (ref 12.0–15.0)
LYMPHS ABS: 1.9 10*3/uL (ref 0.7–4.0)
Lymphocytes Relative: 15 %
MCH: 26.2 pg (ref 26.0–34.0)
MCHC: 31.9 g/dL (ref 30.0–36.0)
MCV: 82.3 fL (ref 78.0–100.0)
Monocytes Absolute: 0.9 10*3/uL (ref 0.1–1.0)
Monocytes Relative: 7 %
NEUTROS ABS: 9.8 10*3/uL — AB (ref 1.7–7.7)
Neutrophils Relative %: 74 %
Platelets: 470 10*3/uL — ABNORMAL HIGH (ref 150–400)
RBC: 4.23 MIL/uL (ref 3.87–5.11)
RDW: 16.1 % — ABNORMAL HIGH (ref 11.5–15.5)
WBC: 13.1 10*3/uL — ABNORMAL HIGH (ref 4.0–10.5)

## 2016-07-13 LAB — COMPREHENSIVE METABOLIC PANEL
ALBUMIN: 2.7 g/dL — AB (ref 3.5–5.0)
ALK PHOS: 58 U/L (ref 38–126)
ALT: 9 U/L — ABNORMAL LOW (ref 14–54)
ANION GAP: 7 (ref 5–15)
AST: 13 U/L — ABNORMAL LOW (ref 15–41)
BUN: 5 mg/dL — ABNORMAL LOW (ref 6–20)
CALCIUM: 8.4 mg/dL — AB (ref 8.9–10.3)
CO2: 28 mmol/L (ref 22–32)
Chloride: 101 mmol/L (ref 101–111)
Creatinine, Ser: <0.3 mg/dL — ABNORMAL LOW (ref 0.44–1.00)
Glucose, Bld: 119 mg/dL — ABNORMAL HIGH (ref 65–99)
POTASSIUM: 3.4 mmol/L — AB (ref 3.5–5.1)
SODIUM: 136 mmol/L (ref 135–145)
Total Bilirubin: 0.2 mg/dL — ABNORMAL LOW (ref 0.3–1.2)
Total Protein: 6.3 g/dL — ABNORMAL LOW (ref 6.5–8.1)

## 2016-07-13 LAB — LIPASE, BLOOD: Lipase: 14 U/L (ref 11–51)

## 2016-07-13 LAB — I-STAT BETA HCG BLOOD, ED (MC, WL, AP ONLY)

## 2016-07-13 MED ORDER — OXYCODONE HCL 10 MG PO TABS: 10.0000 mg | ORAL_TABLET | ORAL | 0 refills | Status: DC | PRN

## 2016-07-13 MED ORDER — METOCLOPRAMIDE HCL 10 MG PO TABS: 10.0000 mg | ORAL_TABLET | Freq: Four times a day (QID) | ORAL | 0 refills | Status: DC | PRN

## 2016-07-13 MED ORDER — ONDANSETRON HCL 4 MG/2ML IJ SOLN
4.0000 mg | Freq: Once | INTRAMUSCULAR | Status: AC
  Administered 2016-07-13: 4 mg via INTRAVENOUS
  Filled 2016-07-13: qty 2

## 2016-07-13 MED ORDER — MORPHINE SULFATE (PF) 4 MG/ML IV SOLN
4.0000 mg | Freq: Once | INTRAVENOUS | Status: AC
  Administered 2016-07-13: 4 mg via INTRAVENOUS
  Filled 2016-07-13: qty 1

## 2016-07-13 MED ORDER — DIPHENHYDRAMINE HCL 50 MG/ML IJ SOLN
25.0000 mg | Freq: Once | INTRAMUSCULAR | Status: AC
  Administered 2016-07-13: 25 mg via INTRAVENOUS
  Filled 2016-07-13: qty 1

## 2016-07-13 MED ORDER — METOCLOPRAMIDE HCL 5 MG/ML IJ SOLN
10.0000 mg | Freq: Once | INTRAMUSCULAR | Status: AC
  Administered 2016-07-13: 10 mg via INTRAVENOUS
  Filled 2016-07-13: qty 2

## 2016-07-13 MED ORDER — SODIUM CHLORIDE 0.9 % IV BOLUS (SEPSIS)
1000.0000 mL | Freq: Once | INTRAVENOUS | Status: AC
  Administered 2016-07-13: 1000 mL via INTRAVENOUS

## 2016-07-13 NOTE — Discharge Instructions (Signed)
Return if pain is not being adequately controlled, nausea is not being adequately controlled, or if you start running a high fever.

## 2016-07-13 NOTE — ED Notes (Addendum)
Pt stated "if yall send me back home I am just going to come right back." Pt C/O nausea, MD notified.

## 2016-07-15 LAB — AEROBIC/ANAEROBIC CULTURE (SURGICAL/DEEP WOUND)

## 2016-07-15 LAB — AEROBIC/ANAEROBIC CULTURE W GRAM STAIN (SURGICAL/DEEP WOUND)

## 2016-07-22 ENCOUNTER — Ambulatory Visit (INDEPENDENT_AMBULATORY_CARE_PROVIDER_SITE_OTHER): Payer: Medicaid Other | Admitting: Internal Medicine

## 2016-07-22 ENCOUNTER — Encounter: Payer: Self-pay | Admitting: Internal Medicine

## 2016-07-22 DIAGNOSIS — Z452 Encounter for adjustment and management of vascular access device: Secondary | ICD-10-CM | POA: Insufficient documentation

## 2016-07-22 DIAGNOSIS — M4628 Osteomyelitis of vertebra, sacral and sacrococcygeal region: Secondary | ICD-10-CM | POA: Diagnosis not present

## 2016-07-22 MED ORDER — DOXYCYCLINE HYCLATE 100 MG PO CAPS: 100.0000 mg | ORAL_CAPSULE | Freq: Two times a day (BID) | ORAL | 1 refills | Status: DC

## 2016-07-22 NOTE — Progress Notes (Signed)
   Subjective:    [name], female    DOB: [birth year], 36 y.o.   MRN: [id number]  HPI Here for follow up with osteomyelitis.   She has a history of paraplegia with stage IV decubitus ulcer and was hospitalized last month with infection.  Bone culture sent and positive for Strep species and treated with 6 weeks of IV ceftriaxone and flagyl with an end date of today.  She also had a bone culture done by wound care on 5/11 that was positive for MRSA.  Has wound VAC on and has closed just a small amount according to her.  Gets some intermittent fever and chills that she attributes to her gallbladder.  No rash, no associated diarrhea.    Review of Systems  Neurological: Negative for dizziness and light-headedness.       Objective:   Physical Exam  Constitutional: She appears well-developed and well-nourished.  In wheelchair  Eyes: No scleral icterus.  Cardiovascular: Normal rate, regular rhythm and normal heart sounds.   Pulmonary/Chest: Effort normal and breath sounds normal. No respiratory distress.  Musculoskeletal:  Moves upper extremities  Lymphadenopathy:    She has no cervical adenopathy.   SH: is at [facility] for rehab      Assessment & Plan:

## 2016-07-22 NOTE — Assessment & Plan Note (Addendum)
Slow to heal and I am concerned that the MRSA is part of it and not improved or worsened.  I will have her start doxycycline which is sensitive and plan for 6 weeks with follow up with me in 4-5 weeks.  Will check ESR and CRP then.

## 2016-07-22 NOTE — Assessment & Plan Note (Signed)
Ok to pull picc line.  Gave rec on her SNF order

## 2016-07-24 ENCOUNTER — Ambulatory Visit (INDEPENDENT_AMBULATORY_CARE_PROVIDER_SITE_OTHER): Payer: Medicaid Other | Admitting: General Surgery

## 2016-07-24 ENCOUNTER — Encounter: Payer: Self-pay | Admitting: General Surgery

## 2016-07-24 VITALS — BP 112/77 | HR 85 | Temp 98.7°F | Resp 18 | Ht 66.0 in | Wt 201.0 lb

## 2016-07-24 DIAGNOSIS — K8020 Calculus of gallbladder without cholecystitis without obstruction: Secondary | ICD-10-CM | POA: Diagnosis not present
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
## 2016-07-25 DIAGNOSIS — L89154 Pressure ulcer of sacral region, stage 4: Secondary | ICD-10-CM | POA: Diagnosis not present

## 2016-08-01 ENCOUNTER — Encounter (HOSPITAL_BASED_OUTPATIENT_CLINIC_OR_DEPARTMENT_OTHER): Payer: Medicaid Other | Attending: Internal Medicine

## 2016-08-01 DIAGNOSIS — L89624 Pressure ulcer of left heel, stage 4: Secondary | ICD-10-CM | POA: Insufficient documentation

## 2016-08-01 DIAGNOSIS — G8222 Paraplegia, incomplete: Secondary | ICD-10-CM | POA: Insufficient documentation

## 2016-08-01 DIAGNOSIS — L89154 Pressure ulcer of sacral region, stage 4: Secondary | ICD-10-CM | POA: Insufficient documentation

## 2016-08-01 DIAGNOSIS — M4628 Osteomyelitis of vertebra, sacral and sacrococcygeal region: Secondary | ICD-10-CM | POA: Insufficient documentation

## 2016-08-01 DIAGNOSIS — Z8614 Personal history of Methicillin resistant Staphylococcus aureus infection: Secondary | ICD-10-CM | POA: Insufficient documentation

## 2016-08-08 DIAGNOSIS — G8222 Paraplegia, incomplete: Secondary | ICD-10-CM | POA: Diagnosis not present

## 2016-08-08 DIAGNOSIS — L89624 Pressure ulcer of left heel, stage 4: Secondary | ICD-10-CM | POA: Diagnosis not present

## 2016-08-08 DIAGNOSIS — Z8614 Personal history of Methicillin resistant Staphylococcus aureus infection: Secondary | ICD-10-CM | POA: Diagnosis not present

## 2016-08-08 DIAGNOSIS — M4628 Osteomyelitis of vertebra, sacral and sacrococcygeal region: Secondary | ICD-10-CM | POA: Diagnosis not present

## 2016-08-08 DIAGNOSIS — L89154 Pressure ulcer of sacral region, stage 4: Secondary | ICD-10-CM | POA: Diagnosis not present

## 2016-08-08 NOTE — Addendum Note (Signed)
Addendum  created 08/08/16 1159 by Lyn Hollingshead, MD   Sign clinical note

## 2016-08-20 ENCOUNTER — Ambulatory Visit (INDEPENDENT_AMBULATORY_CARE_PROVIDER_SITE_OTHER): Payer: Medicaid Other | Admitting: Physician Assistant

## 2016-08-21 ENCOUNTER — Ambulatory Visit: Payer: Self-pay | Admitting: General Surgery

## 2016-08-22 DIAGNOSIS — L89154 Pressure ulcer of sacral region, stage 4: Secondary | ICD-10-CM | POA: Diagnosis not present

## 2016-08-25 ENCOUNTER — Encounter (INDEPENDENT_AMBULATORY_CARE_PROVIDER_SITE_OTHER): Payer: Self-pay | Admitting: Physician Assistant

## 2016-08-25 ENCOUNTER — Ambulatory Visit (INDEPENDENT_AMBULATORY_CARE_PROVIDER_SITE_OTHER): Payer: Medicaid Other | Admitting: Physician Assistant

## 2016-08-25 DIAGNOSIS — M25562 Pain in left knee: Secondary | ICD-10-CM | POA: Diagnosis not present
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
## 2016-08-27 ENCOUNTER — Encounter (HOSPITAL_COMMUNITY): Payer: Self-pay | Admitting: *Deleted

## 2016-08-27 NOTE — Pre-Procedure Instructions (Signed)
    [name]  08/27/2016      [place], [place] - [address] [place] [address] Phone: [phone] Fax: [phone]    Your procedure is scheduled on Thursday, August 28, 2016  Report to [facility] Admitting at 8:30 A.M.  Call this number if you have problems the morning of surgery:  [phone]   Remember:  Do not eat food or drink liquids after midnight.  Take these medicines the morning of surgery with A SIP OF WATER: ALPRAZolam [name]), citalopram (CELEXA), gabapentin (NEURONTIN), levothyroxine (SYNTHROID),  morphine (MS CONTIN), if needed: pain medication , medication for nausea and vomiting  Stop taking Aspirin,vitamins, fish oil and herbal medications. Do not take any NSAIDs ie: Ibuprofen, Motrin, Advil, Naproxen ( Aleve) , BC and Goody Powder or any medication containing Aspirin; stop now.  Do not wear jewelry, make-up or nail polish.  Do not wear lotions, powders, or perfumes, or deoderant.  Do not shave 48 hours prior to surgery.    Do not bring valuables to the hospital.  [facility] is not responsible for any belongings or valuables.  Contacts, dentures or bridgework may not be worn into surgery.  Leave your suitcase in the car.  After surgery it may be brought to your room.  For patients admitted to the hospital, discharge time will be determined by your treatment team.  Patients discharged the day of surgery will not be allowed to drive home.

## 2016-08-27 NOTE — Progress Notes (Signed)
SDW-Pre-op call was completed by both pt and pt nurse, April, LPN. Nurse confirmed receipt of fax and verbalized understanding of all pre-op instructions. Nurse denies that pt C/O SOB, chest pain and being under the care of a cardiologist. Nurse denies that pt had a stress test, echo and cardiac cath.

## 2016-08-28 ENCOUNTER — Ambulatory Visit (HOSPITAL_COMMUNITY): Payer: Medicaid Other | Admitting: Anesthesiology

## 2016-08-28 ENCOUNTER — Encounter (HOSPITAL_COMMUNITY): Payer: Self-pay | Admitting: Urology

## 2016-08-28 ENCOUNTER — Inpatient Hospital Stay (HOSPITAL_COMMUNITY)
Admission: RE | Admit: 2016-08-28 | Discharge: 2016-08-30 | DRG: 417 | Disposition: A | Discharge status: Skilled Nursing Facility | Payer: Medicaid Other | Source: Ambulatory Visit | Attending: General Surgery | Admitting: General Surgery

## 2016-08-28 ENCOUNTER — Encounter (HOSPITAL_COMMUNITY)
Admission: RE | Disposition: A | Discharge status: Skilled Nursing Facility | Payer: Self-pay | Source: Ambulatory Visit | Attending: General Surgery

## 2016-08-28 DIAGNOSIS — K8010 Calculus of gallbladder with chronic cholecystitis without obstruction: Principal | ICD-10-CM | POA: Diagnosis present

## 2016-08-28 DIAGNOSIS — G8221 Paraplegia, complete: Secondary | ICD-10-CM | POA: Diagnosis present

## 2016-08-28 DIAGNOSIS — E669 Obesity, unspecified: Secondary | ICD-10-CM | POA: Diagnosis present

## 2016-08-28 DIAGNOSIS — L89620 Pressure ulcer of left heel, unstageable: Secondary | ICD-10-CM | POA: Diagnosis present

## 2016-08-28 DIAGNOSIS — F329 Major depressive disorder, single episode, unspecified: Secondary | ICD-10-CM | POA: Diagnosis present

## 2016-08-28 DIAGNOSIS — E039 Hypothyroidism, unspecified: Secondary | ICD-10-CM | POA: Diagnosis present

## 2016-08-28 DIAGNOSIS — K660 Peritoneal adhesions (postprocedural) (postinfection): Secondary | ICD-10-CM | POA: Diagnosis present

## 2016-08-28 DIAGNOSIS — L89154 Pressure ulcer of sacral region, stage 4: Secondary | ICD-10-CM | POA: Diagnosis present

## 2016-08-28 DIAGNOSIS — Z981 Arthrodesis status: Secondary | ICD-10-CM

## 2016-08-28 DIAGNOSIS — N319 Neuromuscular dysfunction of bladder, unspecified: Secondary | ICD-10-CM | POA: Diagnosis present

## 2016-08-28 DIAGNOSIS — F17210 Nicotine dependence, cigarettes, uncomplicated: Secondary | ICD-10-CM | POA: Diagnosis present

## 2016-08-28 DIAGNOSIS — K219 Gastro-esophageal reflux disease without esophagitis: Secondary | ICD-10-CM | POA: Diagnosis present

## 2016-08-28 DIAGNOSIS — Z6832 Body mass index (BMI) 32.0-32.9, adult: Secondary | ICD-10-CM

## 2016-08-28 HISTORY — DX: Paraplegia, unspecified: G82.20

## 2016-08-28 HISTORY — DX: Hypothyroidism, unspecified: E03.9

## 2016-08-28 HISTORY — PX: LAPAROSCOPIC CHOLECYSTECTOMY: SUR755

## 2016-08-28 HISTORY — DX: Nausea with vomiting, unspecified: Z98.890

## 2016-08-28 HISTORY — DX: Other specified postprocedural states: R11.2

## 2016-08-28 HISTORY — DX: Calculus of bile duct without cholangitis or cholecystitis without obstruction: K80.50

## 2016-08-28 HISTORY — PX: CHOLECYSTECTOMY: SHX55

## 2016-08-28 LAB — COMPREHENSIVE METABOLIC PANEL
ALBUMIN: 2.6 g/dL — AB (ref 3.5–5.0)
ALT: 13 U/L — ABNORMAL LOW (ref 14–54)
ANION GAP: 5 (ref 5–15)
AST: 18 U/L (ref 15–41)
Alkaline Phosphatase: 111 U/L (ref 38–126)
BUN: <5 mg/dL — ABNORMAL LOW (ref 6–20)
CHLORIDE: 102 mmol/L (ref 101–111)
CO2: 32 mmol/L (ref 22–32)
Calcium: 9 mg/dL (ref 8.9–10.3)
Creatinine, Ser: 0.45 mg/dL (ref 0.44–1.00)
GFR calc Af Amer: >60 mL/min (ref >60)
GLUCOSE: 86 mg/dL (ref 65–99)
POTASSIUM: 3.6 mmol/L (ref 3.5–5.1)
Sodium: 139 mmol/L (ref 135–145)
TOTAL PROTEIN: 6.1 g/dL — AB (ref 6.5–8.1)
Total Bilirubin: 0.5 mg/dL (ref 0.3–1.2)

## 2016-08-28 LAB — CBC WITH DIFFERENTIAL/PLATELET
BASOS ABS: 0 10*3/uL (ref 0.0–0.1)
Basophils Relative: 0 %
EOS PCT: 8 %
Eosinophils Absolute: 0.6 10*3/uL (ref 0.0–0.7)
HEMATOCRIT: 34.5 % — AB (ref 36.0–46.0)
Hemoglobin: 10.8 g/dL — ABNORMAL LOW (ref 12.0–15.0)
LYMPHS ABS: 2.9 10*3/uL (ref 0.7–4.0)
LYMPHS PCT: 36 %
MCH: 26.5 pg (ref 26.0–34.0)
MCHC: 31.3 g/dL (ref 30.0–36.0)
MCV: 84.8 fL (ref 78.0–100.0)
Monocytes Absolute: 0.6 10*3/uL (ref 0.1–1.0)
Monocytes Relative: 7 %
NEUTROS ABS: 4 10*3/uL (ref 1.7–7.7)
Neutrophils Relative %: 49 %
PLATELETS: 449 10*3/uL — AB (ref 150–400)
RBC: 4.07 MIL/uL (ref 3.87–5.11)
RDW: 17.1 % — ABNORMAL HIGH (ref 11.5–15.5)
WBC: 8.2 10*3/uL (ref 4.0–10.5)

## 2016-08-28 LAB — HCG, SERUM, QUALITATIVE: Preg, Serum: NEGATIVE

## 2016-08-28 LAB — GLUCOSE, CAPILLARY: GLUCOSE-CAPILLARY: 249 mg/dL — AB (ref 65–99)

## 2016-08-28 SURGERY — LAPAROSCOPIC CHOLECYSTECTOMY
Anesthesia: General

## 2016-08-28 MED ORDER — SUGAMMADEX SODIUM 200 MG/2ML IV SOLN
INTRAVENOUS | Status: AC
  Filled 2016-08-28: qty 2

## 2016-08-28 MED ORDER — ALPRAZOLAM 0.25 MG PO TABS
0.2500 mg | ORAL_TABLET | Freq: Three times a day (TID) | ORAL | Status: DC
  Administered 2016-08-28 – 2016-08-30 (×7): 0.25 mg via ORAL
  Filled 2016-08-28 (×7): qty 1

## 2016-08-28 MED ORDER — GABAPENTIN 300 MG PO CAPS: 300.0000 mg | ORAL_CAPSULE | ORAL | Status: DC

## 2016-08-28 MED ORDER — ROCURONIUM BROMIDE 10 MG/ML (PF) SYRINGE
PREFILLED_SYRINGE | INTRAVENOUS | Status: AC
  Filled 2016-08-28: qty 5

## 2016-08-28 MED ORDER — SIMETHICONE 80 MG PO CHEW: 40.0000 mg | CHEWABLE_TABLET | Freq: Four times a day (QID) | ORAL | Status: DC | PRN

## 2016-08-28 MED ORDER — SODIUM CHLORIDE 0.9 % IR SOLN
Status: DC | PRN
  Administered 2016-08-28: 1

## 2016-08-28 MED ORDER — OXYCODONE HCL 5 MG PO TABS
10.0000 mg | ORAL_TABLET | ORAL | Status: DC | PRN
  Administered 2016-08-28 – 2016-08-30 (×6): 10 mg via ORAL
  Filled 2016-08-28 (×7): qty 2

## 2016-08-28 MED ORDER — METOCLOPRAMIDE HCL 10 MG PO TABS: 10.0000 mg | ORAL_TABLET | Freq: Four times a day (QID) | ORAL | Status: DC | PRN

## 2016-08-28 MED ORDER — GABAPENTIN 300 MG PO CAPS
ORAL_CAPSULE | ORAL | Status: AC
  Filled 2016-08-28: qty 1

## 2016-08-28 MED ORDER — MORPHINE SULFATE ER 15 MG PO TBCR
30.0000 mg | EXTENDED_RELEASE_TABLET | Freq: Two times a day (BID) | ORAL | Status: DC
  Administered 2016-08-28 – 2016-08-30 (×4): 30 mg via ORAL
  Filled 2016-08-28 (×4): qty 2

## 2016-08-28 MED ORDER — SODIUM CHLORIDE 0.9 % IV SOLN
INTRAVENOUS | Status: DC
  Administered 2016-08-28 – 2016-08-30 (×2): via INTRAVENOUS

## 2016-08-28 MED ORDER — KETOROLAC TROMETHAMINE 30 MG/ML IJ SOLN
INTRAMUSCULAR | Status: AC
  Filled 2016-08-28: qty 1

## 2016-08-28 MED ORDER — ACETAMINOPHEN 500 MG PO TABS
ORAL_TABLET | ORAL | Status: AC
  Administered 2016-08-28: 1000 mg via ORAL
  Filled 2016-08-28: qty 2

## 2016-08-28 MED ORDER — CEFOTETAN DISODIUM-DEXTROSE 2-2.08 GM-% IV SOLR
INTRAVENOUS | Status: AC
  Filled 2016-08-28: qty 50

## 2016-08-28 MED ORDER — 0.9 % SODIUM CHLORIDE (POUR BTL) OPTIME
TOPICAL | Status: DC | PRN
Start: 2016-08-28 — End: 2016-08-28
  Administered 2016-08-28: 1000 mL

## 2016-08-28 MED ORDER — SCOPOLAMINE 1 MG/3DAYS TD PT72
1.0000 | MEDICATED_PATCH | TRANSDERMAL | Status: DC
  Administered 2016-08-28: 1.5 mg via TRANSDERMAL
  Filled 2016-08-28: qty 1

## 2016-08-28 MED ORDER — CHLORHEXIDINE GLUCONATE CLOTH 2 % EX PADS: 6.0000 | MEDICATED_PAD | Freq: Once | CUTANEOUS | Status: DC

## 2016-08-28 MED ORDER — ONDANSETRON HCL 4 MG/2ML IJ SOLN
INTRAMUSCULAR | Status: AC
  Filled 2016-08-28: qty 2

## 2016-08-28 MED ORDER — METHOCARBAMOL 500 MG PO TABS
1000.0000 mg | ORAL_TABLET | Freq: Four times a day (QID) | ORAL | Status: DC | PRN
  Administered 2016-08-28 – 2016-08-29 (×2): 1000 mg via ORAL
  Filled 2016-08-28 (×3): qty 2

## 2016-08-28 MED ORDER — LACTATED RINGERS IV SOLN
INTRAVENOUS | Status: DC
Start: 2016-08-28 — End: 2016-08-28
  Administered 2016-08-28 (×2): via INTRAVENOUS

## 2016-08-28 MED ORDER — CITALOPRAM HYDROBROMIDE 40 MG PO TABS
40.0000 mg | ORAL_TABLET | Freq: Every day | ORAL | Status: DC
  Administered 2016-08-29 – 2016-08-30 (×2): 40 mg via ORAL
  Filled 2016-08-28: qty 2
  Filled 2016-08-28: qty 1
  Filled 2016-08-28: qty 2
  Filled 2016-08-28: qty 1

## 2016-08-28 MED ORDER — KETOROLAC TROMETHAMINE 30 MG/ML IJ SOLN
30.0000 mg | Freq: Four times a day (QID) | INTRAMUSCULAR | Status: DC | PRN
  Administered 2016-08-28: 30 mg via INTRAVENOUS

## 2016-08-28 MED ORDER — MIDAZOLAM HCL 2 MG/2ML IJ SOLN
INTRAMUSCULAR | Status: AC
  Filled 2016-08-28: qty 2

## 2016-08-28 MED ORDER — LEVOTHYROXINE SODIUM 88 MCG PO TABS
88.0000 ug | ORAL_TABLET | Freq: Every day | ORAL | Status: DC
  Administered 2016-08-29 – 2016-08-30 (×2): 88 ug via ORAL
  Filled 2016-08-28 (×3): qty 1

## 2016-08-28 MED ORDER — FENTANYL CITRATE (PF) 250 MCG/5ML IJ SOLN
INTRAMUSCULAR | Status: AC
  Filled 2016-08-28: qty 5

## 2016-08-28 MED ORDER — ROCURONIUM BROMIDE 100 MG/10ML IV SOLN
INTRAVENOUS | Status: DC | PRN
  Administered 2016-08-28: 60 mg via INTRAVENOUS

## 2016-08-28 MED ORDER — MORPHINE SULFATE (PF) 4 MG/ML IV SOLN
INTRAVENOUS | Status: AC
  Administered 2016-08-28: 4 mg
  Filled 2016-08-28: qty 1

## 2016-08-28 MED ORDER — DEXAMETHASONE SODIUM PHOSPHATE 10 MG/ML IJ SOLN
INTRAMUSCULAR | Status: AC
  Filled 2016-08-28: qty 1

## 2016-08-28 MED ORDER — MORPHINE SULFATE (PF) 4 MG/ML IV SOLN
2.0000 mg | INTRAVENOUS | Status: DC | PRN
  Administered 2016-08-28 – 2016-08-30 (×3): 4 mg via INTRAVENOUS
  Filled 2016-08-28 (×3): qty 1

## 2016-08-28 MED ORDER — HEPARIN SODIUM (PORCINE) 5000 UNIT/ML IJ SOLN
INTRAMUSCULAR | Status: AC
  Administered 2016-08-28: 5000 [IU] via SUBCUTANEOUS
  Filled 2016-08-28: qty 1

## 2016-08-28 MED ORDER — BUPIVACAINE-EPINEPHRINE (PF) 0.25% -1:200000 IJ SOLN
INTRAMUSCULAR | Status: AC
  Filled 2016-08-28: qty 30

## 2016-08-28 MED ORDER — DEXAMETHASONE SODIUM PHOSPHATE 10 MG/ML IJ SOLN
INTRAMUSCULAR | Status: DC | PRN
  Administered 2016-08-28: 10 mg via INTRAVENOUS

## 2016-08-28 MED ORDER — ACETAMINOPHEN 500 MG PO TABS
1000.0000 mg | ORAL_TABLET | ORAL | Status: AC
  Administered 2016-08-28: 1000 mg via ORAL

## 2016-08-28 MED ORDER — HEPARIN SODIUM (PORCINE) 5000 UNIT/ML IJ SOLN
5000.0000 [IU] | Freq: Once | INTRAMUSCULAR | Status: AC
  Administered 2016-08-28: 5000 [IU] via SUBCUTANEOUS

## 2016-08-28 MED ORDER — CEFOTETAN DISODIUM-DEXTROSE 2-2.08 GM-% IV SOLR
2.0000 g | INTRAVENOUS | Status: AC
  Administered 2016-08-28: 2 g via INTRAVENOUS

## 2016-08-28 MED ORDER — GABAPENTIN 600 MG PO TABS
600.0000 mg | ORAL_TABLET | Freq: Three times a day (TID) | ORAL | Status: DC
  Administered 2016-08-28 – 2016-08-30 (×6): 600 mg via ORAL
  Filled 2016-08-28 (×6): qty 1

## 2016-08-28 MED ORDER — PANTOPRAZOLE SODIUM 40 MG PO TBEC
40.0000 mg | DELAYED_RELEASE_TABLET | Freq: Every day | ORAL | Status: DC
  Administered 2016-08-28 – 2016-08-30 (×3): 40 mg via ORAL
  Filled 2016-08-28 (×2): qty 1

## 2016-08-28 MED ORDER — SUGAMMADEX SODIUM 200 MG/2ML IV SOLN
INTRAVENOUS | Status: DC | PRN
  Administered 2016-08-28: 200 mg via INTRAVENOUS

## 2016-08-28 MED ORDER — MIDAZOLAM HCL 5 MG/5ML IJ SOLN
INTRAMUSCULAR | Status: DC | PRN
  Administered 2016-08-28: 2 mg via INTRAVENOUS

## 2016-08-28 MED ORDER — OXYCODONE HCL 5 MG PO TABS
ORAL_TABLET | ORAL | Status: AC
  Administered 2016-08-28: 10 mg via ORAL
  Filled 2016-08-28: qty 2

## 2016-08-28 MED ORDER — ONDANSETRON HCL 4 MG/2ML IJ SOLN: 4.0000 mg | Freq: Four times a day (QID) | INTRAMUSCULAR | Status: DC | PRN

## 2016-08-28 MED ORDER — ONDANSETRON 4 MG PO TBDP: 4.0000 mg | ORAL_TABLET | Freq: Four times a day (QID) | ORAL | Status: DC | PRN

## 2016-08-28 MED ORDER — POLYETHYLENE GLYCOL 3350 17 G PO PACK
17.0000 g | PACK | Freq: Every day | ORAL | Status: DC
  Administered 2016-08-30: 17 g via ORAL
  Filled 2016-08-28 (×2): qty 1

## 2016-08-28 MED ORDER — DOCUSATE SODIUM 100 MG PO CAPS
100.0000 mg | ORAL_CAPSULE | Freq: Two times a day (BID) | ORAL | Status: DC
  Administered 2016-08-30: 100 mg via ORAL
  Filled 2016-08-28 (×4): qty 1

## 2016-08-28 MED ORDER — PROPOFOL 10 MG/ML IV BOLUS
INTRAVENOUS | Status: AC
  Filled 2016-08-28: qty 20

## 2016-08-28 MED ORDER — ONDANSETRON HCL 4 MG/2ML IJ SOLN
INTRAMUSCULAR | Status: DC | PRN
  Administered 2016-08-28: 4 mg via INTRAVENOUS

## 2016-08-28 MED ORDER — BUPIVACAINE-EPINEPHRINE 0.25% -1:200000 IJ SOLN
INTRAMUSCULAR | Status: DC | PRN
  Administered 2016-08-28: 19 mL

## 2016-08-28 MED ORDER — LIDOCAINE 2% (20 MG/ML) 5 ML SYRINGE
INTRAMUSCULAR | Status: AC
  Filled 2016-08-28: qty 5

## 2016-08-28 MED ORDER — HYDRALAZINE HCL 20 MG/ML IJ SOLN: 10.0000 mg | INTRAMUSCULAR | Status: DC | PRN

## 2016-08-28 MED ORDER — PROPOFOL 10 MG/ML IV BOLUS
INTRAVENOUS | Status: DC | PRN
  Administered 2016-08-28: 140 mg via INTRAVENOUS

## 2016-08-28 MED ORDER — FENTANYL CITRATE (PF) 100 MCG/2ML IJ SOLN
INTRAMUSCULAR | Status: DC | PRN
  Administered 2016-08-28: 150 ug via INTRAVENOUS
  Administered 2016-08-28 (×2): 50 ug via INTRAVENOUS

## 2016-08-28 MED ORDER — ENOXAPARIN SODIUM 40 MG/0.4ML ~~LOC~~ SOLN
40.0000 mg | SUBCUTANEOUS | Status: DC
  Administered 2016-08-29 – 2016-08-30 (×2): 40 mg via SUBCUTANEOUS
  Filled 2016-08-28 (×2): qty 0.4

## 2016-08-28 SURGICAL SUPPLY — 32 items
BLADE CLIPPER SURG (BLADE) IMPLANT
CANISTER SUCT 3000ML PPV (MISCELLANEOUS) ×3 IMPLANT
CHLORAPREP W/TINT 26ML (MISCELLANEOUS) ×3 IMPLANT
CLIP LIGATING HEMO LOK XL GOLD (MISCELLANEOUS) ×6 IMPLANT
COVER SURGICAL LIGHT HANDLE (MISCELLANEOUS) ×3 IMPLANT
DERMABOND ADVANCED (GAUZE/BANDAGES/DRESSINGS) ×2
DERMABOND ADVANCED .7 DNX12 (GAUZE/BANDAGES/DRESSINGS) ×1 IMPLANT
ELECT REM PT RETURN 9FT ADLT (ELECTROSURGICAL) ×3
ELECTRODE REM PT RTRN 9FT ADLT (ELECTROSURGICAL) ×1 IMPLANT
GLOVE BIOGEL PI IND STRL 7.0 (GLOVE) ×2 IMPLANT
GLOVE BIOGEL PI INDICATOR 7.0 (GLOVE) ×4
GLOVE SURG SS PI 7.0 STRL IVOR (GLOVE) ×9 IMPLANT
GOWN STRL REUS W/ TWL LRG LVL3 (GOWN DISPOSABLE) ×3 IMPLANT
GOWN STRL REUS W/TWL LRG LVL3 (GOWN DISPOSABLE) ×6
GRASPER SUT TROCAR 14GX15 (MISCELLANEOUS) ×3 IMPLANT
KIT BASIN OR (CUSTOM PROCEDURE TRAY) ×3 IMPLANT
KIT ROOM TURNOVER OR (KITS) ×3 IMPLANT
NS IRRIG 1000ML POUR BTL (IV SOLUTION) ×3 IMPLANT
PAD ARMBOARD 7.5X6 YLW CONV (MISCELLANEOUS) ×3 IMPLANT
POUCH RETRIEVAL ECOSAC 10 (ENDOMECHANICALS) ×1 IMPLANT
POUCH RETRIEVAL ECOSAC 10MM (ENDOMECHANICALS) ×2
SCISSORS LAP 5X35 DISP (ENDOMECHANICALS) ×3 IMPLANT
SET IRRIG TUBING LAPAROSCOPIC (IRRIGATION / IRRIGATOR) ×3 IMPLANT
SLEEVE ENDOPATH XCEL 5M (ENDOMECHANICALS) ×6 IMPLANT
SPECIMEN JAR SMALL (MISCELLANEOUS) ×3 IMPLANT
SUT MNCRL AB 4-0 PS2 18 (SUTURE) ×6 IMPLANT
TOWEL OR 17X24 6PK STRL BLUE (TOWEL DISPOSABLE) ×3 IMPLANT
TOWEL OR 17X26 10 PK STRL BLUE (TOWEL DISPOSABLE) ×3 IMPLANT
TRAY LAPAROSCOPIC MC (CUSTOM PROCEDURE TRAY) ×3 IMPLANT
TROCAR XCEL 12X100 BLDLESS (ENDOMECHANICALS) ×3 IMPLANT
TROCAR XCEL NON-BLD 5MMX100MML (ENDOMECHANICALS) ×3 IMPLANT
TUBING INSUFFLATION (TUBING) ×3 IMPLANT

## 2016-08-28 NOTE — Op Note (Signed)
PATIENT:  [name]  36 y.o. female  PRE-OPERATIVE DIAGNOSIS:  Chronic Calculous Cholecystitis  POST-OPERATIVE DIAGNOSIS:  Chronic Calculous Cholecystitis  PROCEDURE:  Procedure(s): LAPAROSCOPIC CHOLECYSTECTOMY   SURGEON:  Surgeon(s): Jones Viviani, Arta Bruce, MD  ASSISTANT: Marcie Bal, PA student  ANESTHESIA:   local and general  Indications for procedure: [name] is a 36 y.o. female with symptoms of Abdominal pain and Nausea and vomiting consistent with gallbladder disease, Confirmed by Ultrasound and CT.  Description of procedure: The patient was brought into the operative suite, placed supine. Anesthesia was administered with endotracheal tube. Patient was strapped in place and foot board was secured. All pressure points were offloaded by foam padding. The patient was prepped and draped in the usual sterile fashion.  A small incision was made to the right of the umbilicus. A 28mm trocar was inserted into the peritoneal cavity with optical entry. Pneumoperitoneum was applied with high flow low pressure. 2 41mm trocars were placed in the RUQ. A 34mm trocar was placed in the subxiphoid space. All trocars sites were first anesthesized with 0.25% marcaine with epinephrine in the subcutaneous and preperitoneal layers. Next the patient was placed in reverse trendelenberg. The gallbladder was white in color and covered with adhesions to the omentum. These were taken down with cautery.  The gallbladder was retracted cephalad and lateral. The peritoneum was reflected off the infundibulum working lateral to medial. The cystic duct and cystic artery were identified and further dissection revealed a critical view.  The cystic duct and cystic artery were doubly clipped and ligated.   The gallbladder was removed off the liver bed with cautery. The Gallbladder was placed in a specimen bag. The gallbladder fossa was irrigated and hemostasis was applied with cautery. The gallbladder was  removed via the 23mm trocar. The fascial defect was closed with interrupted 0 vicryl suture via laparoscopic trans-fascial suture passer. Pneumoperitoneum was removed, all trocar were removed. All incisions were closed with 4-0 monocryl subcuticular stitch. The patient woke from anesthesia and was brought to PACU in stable condition. All counts were correct  Findings: inflamed gallbladder with large stone  Specimen: gallbladder  Blood loss: Total I/O In: 1000 [I.V.:1000] Out: -  ml  Local anesthesia: 20 ml 9.1% marcaine  Complications: none  PLAN OF CARE: Admit for overnight observation  PATIENT DISPOSITION:  PACU - hemodynamically stable.  Gurney Maxin, M.D. General, Bariatric, & Minimally Invasive Surgery [facility]
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
## 2016-08-28 NOTE — Anesthesia Procedure Notes (Signed)
Procedure Name: Intubation Date/Time: 08/28/2016 11:25 AM Performed by: Jenne Campus Pre-anesthesia Checklist: Patient identified, Emergency Drugs available, Suction available, Patient being monitored and Timeout performed Patient Re-evaluated:Patient Re-evaluated prior to inductionOxygen Delivery Method: Circle system utilized Preoxygenation: Pre-oxygenation with 100% oxygen Intubation Type: IV induction Ventilation: Mask ventilation without difficulty Laryngoscope Size: Miller and 2 Grade View: Grade I Tube type: Oral Tube size: 7.0 mm Number of attempts: 1 Airway Equipment and Method: Stylet Placement Confirmation: ETT inserted through vocal cords under direct vision,  positive ETCO2,  CO2 detector and breath sounds checked- equal and bilateral Secured at: 21 cm Tube secured with: Tape Dental Injury: Teeth and Oropharynx as per pre-operative assessment

## 2016-08-28 NOTE — Transfer of Care (Signed)
Immediate Anesthesia Transfer of Care Note  Patient: [name]  Procedure(s) Performed: Procedure(s): LAPAROSCOPIC CHOLECYSTECTOMY (N/A)  Patient Location: PACU  Anesthesia Type:General  Level of Consciousness: awake, oriented and patient cooperative  Airway & Oxygen Therapy: Patient Spontanous Breathing and Patient connected to nasal cannula oxygen  Post-op Assessment: Report given to RN and Post -op Vital signs reviewed and stable  Post vital signs: Reviewed  Last Vitals:  Vitals:   08/28/16 0926  BP: (!) 107/58  Pulse: 75  Resp: 18  Temp: 37 C    Last Pain:  Vitals:   08/28/16 0941  TempSrc:   PainSc: 8       Patients Stated Pain Goal: 4 (67/28/97 9150)  Complications: No apparent anesthesia complications

## 2016-08-29 ENCOUNTER — Encounter (HOSPITAL_COMMUNITY): Payer: Self-pay | Admitting: General Surgery

## 2016-08-29 DIAGNOSIS — Z981 Arthrodesis status: Secondary | ICD-10-CM | POA: Diagnosis not present

## 2016-08-29 DIAGNOSIS — L89620 Pressure ulcer of left heel, unstageable: Secondary | ICD-10-CM | POA: Diagnosis present

## 2016-08-29 DIAGNOSIS — K660 Peritoneal adhesions (postprocedural) (postinfection): Secondary | ICD-10-CM | POA: Diagnosis present

## 2016-08-29 DIAGNOSIS — N319 Neuromuscular dysfunction of bladder, unspecified: Secondary | ICD-10-CM | POA: Diagnosis present

## 2016-08-29 DIAGNOSIS — F329 Major depressive disorder, single episode, unspecified: Secondary | ICD-10-CM | POA: Diagnosis present

## 2016-08-29 DIAGNOSIS — E039 Hypothyroidism, unspecified: Secondary | ICD-10-CM | POA: Diagnosis present

## 2016-08-29 DIAGNOSIS — G8221 Paraplegia, complete: Secondary | ICD-10-CM | POA: Diagnosis present

## 2016-08-29 DIAGNOSIS — E669 Obesity, unspecified: Secondary | ICD-10-CM | POA: Diagnosis present

## 2016-08-29 DIAGNOSIS — Z6832 Body mass index (BMI) 32.0-32.9, adult: Secondary | ICD-10-CM | POA: Diagnosis not present

## 2016-08-29 DIAGNOSIS — F17210 Nicotine dependence, cigarettes, uncomplicated: Secondary | ICD-10-CM | POA: Diagnosis present

## 2016-08-29 DIAGNOSIS — L89154 Pressure ulcer of sacral region, stage 4: Secondary | ICD-10-CM | POA: Diagnosis present

## 2016-08-29 DIAGNOSIS — R1011 Right upper quadrant pain: Secondary | ICD-10-CM | POA: Diagnosis present

## 2016-08-29 DIAGNOSIS — K219 Gastro-esophageal reflux disease without esophagitis: Secondary | ICD-10-CM | POA: Diagnosis present

## 2016-08-29 DIAGNOSIS — K8010 Calculus of gallbladder with chronic cholecystitis without obstruction: Secondary | ICD-10-CM | POA: Diagnosis present

## 2016-08-29 LAB — COMPREHENSIVE METABOLIC PANEL
ALBUMIN: 2.3 g/dL — AB (ref 3.5–5.0)
ALT: 21 U/L (ref 14–54)
ANION GAP: 6 (ref 5–15)
AST: 35 U/L (ref 15–41)
Alkaline Phosphatase: 99 U/L (ref 38–126)
BUN: <5 mg/dL — ABNORMAL LOW (ref 6–20)
CHLORIDE: 106 mmol/L (ref 101–111)
CO2: 26 mmol/L (ref 22–32)
Calcium: 8.7 mg/dL — ABNORMAL LOW (ref 8.9–10.3)
Creatinine, Ser: 0.4 mg/dL — ABNORMAL LOW (ref 0.44–1.00)
GFR calc Af Amer: >60 mL/min (ref >60)
GFR calc non Af Amer: >60 mL/min (ref >60)
GLUCOSE: 127 mg/dL — AB (ref 65–99)
Potassium: 3.8 mmol/L (ref 3.5–5.1)
SODIUM: 138 mmol/L (ref 135–145)
Total Bilirubin: 0.4 mg/dL (ref 0.3–1.2)
Total Protein: 6.1 g/dL — ABNORMAL LOW (ref 6.5–8.1)

## 2016-08-29 LAB — CBC WITH DIFFERENTIAL/PLATELET
BASOS ABS: 0 10*3/uL (ref 0.0–0.1)
Basophils Relative: 0 %
Eosinophils Absolute: 0 10*3/uL (ref 0.0–0.7)
Eosinophils Relative: 0 %
HEMATOCRIT: 31.4 % — AB (ref 36.0–46.0)
Hemoglobin: 9.6 g/dL — ABNORMAL LOW (ref 12.0–15.0)
LYMPHS PCT: 17 %
Lymphs Abs: 2.3 10*3/uL (ref 0.7–4.0)
MCH: 25.5 pg — ABNORMAL LOW (ref 26.0–34.0)
MCHC: 30.6 g/dL (ref 30.0–36.0)
MCV: 83.5 fL (ref 78.0–100.0)
MONO ABS: 0.7 10*3/uL (ref 0.1–1.0)
MONOS PCT: 5 %
NEUTROS ABS: 10.5 10*3/uL — AB (ref 1.7–7.7)
Neutrophils Relative %: 78 %
Platelets: 492 10*3/uL — ABNORMAL HIGH (ref 150–400)
RBC: 3.76 MIL/uL — ABNORMAL LOW (ref 3.87–5.11)
RDW: 16.7 % — AB (ref 11.5–15.5)
WBC: 13.5 10*3/uL — ABNORMAL HIGH (ref 4.0–10.5)

## 2016-08-29 LAB — GLUCOSE, CAPILLARY
GLUCOSE-CAPILLARY: 142 mg/dL — AB (ref 65–99)
Glucose-Capillary: 124 mg/dL — ABNORMAL HIGH (ref 65–99)
Glucose-Capillary: 85 mg/dL (ref 65–99)

## 2016-08-29 LAB — MRSA PCR SCREENING: MRSA by PCR: NEGATIVE

## 2016-08-29 MED ORDER — IBUPROFEN 800 MG PO TABS: 800.0000 mg | ORAL_TABLET | Freq: Three times a day (TID) | ORAL | 0 refills | Status: DC | PRN

## 2016-08-29 MED ORDER — KETOROLAC TROMETHAMINE 30 MG/ML IJ SOLN
30.0000 mg | Freq: Four times a day (QID) | INTRAMUSCULAR | Status: DC
  Administered 2016-08-29 – 2016-08-30 (×5): 30 mg via INTRAVENOUS
  Filled 2016-08-29 (×5): qty 1

## 2016-08-29 MED ORDER — OXYCODONE HCL 10 MG PO TABS: 10.0000 mg | ORAL_TABLET | ORAL | 0 refills | Status: DC | PRN

## 2016-08-29 MED ORDER — ACETAMINOPHEN 325 MG PO TABS
650.0000 mg | ORAL_TABLET | Freq: Four times a day (QID) | ORAL | Status: DC
  Administered 2016-08-29 – 2016-08-30 (×6): 650 mg via ORAL
  Filled 2016-08-29 (×6): qty 2

## 2016-08-29 MED ORDER — INSULIN ASPART 100 UNIT/ML ~~LOC~~ SOLN
0.0000 [IU] | Freq: Three times a day (TID) | SUBCUTANEOUS | Status: DC
  Administered 2016-08-29: 2 [IU] via SUBCUTANEOUS

## 2016-08-29 NOTE — Consult Note (Signed)
[place] Nurse wound consult note Reason for Consult: sacral wound, admitted with NPWT Wound type: Stage 4 pressure injury sacrum: 4cm x 3.5cm x 2.0cm with undermining that is 2.0cm from 4-8 o'clock; 100% slightly ruddy but not granular Unstageable Pressure injury left heel; 1.0cm x 1.0cm x 0.2cm; 100% white/grey non viable tissue Pressure Injury POA: Yes Measurement:see above Wound bed: see above Drainage (amount, consistency, odor) yellow/green, with no odor from the sacrum.  Yellow with no odor from the left heel.  Periwound: sacrum-epibole of wound edges, slightly macerated  Dressing procedure/placement/frequency: Continue saline dressings while inpatient.  VAC to be sent home with patient for continued use at SNF.  She is planned for DC tomorrow and she is in a lot of pain right now, to avoid having her turned over and causing discomfort will just allow SNF staff to restart [facility] upon her return.  Foam added to heel ulcer, it appears to have a collagen based dressing on the wound bed, I have left this in place and covered with foam for protection. Float heels for pressure redistribution.  Discussed POC with patient and bedside nurse.  Re consult if needed, will not follow at this time. Thanks  Adriauna Campton [employer], RN,CWOCN, CNS, [place] [phone])

## 2016-08-29 NOTE — [place] FL2 (Signed)
[place] MEDICAID FL2 LEVEL OF CARE SCREENING TOOL     IDENTIFICATION  Patient Name: [name] Birthdate: 04-23-1980 Sex: female Admission Date (Current Location): 08/28/2016  [facility] and [place] Number:  Herbalist and Address:  [facility] [place], [place] [address] [place], [place] [address]      Provider Number: [id number]  Attending Physician Name and Address:  Kinsinger, Arta Bruce, MD  Relative Name and Phone Number:       Current Level of Care: Hospital Recommended Level of Care: [place] Prior Approval Number:    Date Approved/Denied:   PASRR Number: [id number]  Discharge Plan: SNF    Current Diagnoses: Patient Active Problem List   Diagnosis Date Noted  . 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 PICC (peripherally inserted central catheter) in place 07/22/2016  . Sacral osteomyelitis ([place])   . Pressure injury of skin 06/11/2016  . Normocytic anemia 06/10/2016  . Post-traumatic paraplegia ([place]) 04/09/2016  . 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 L1 vertebral fracture ([place]) 04/07/2016  . Depression with anxiety 06/06/2013  . Dysmenorrhea 08/09/2012  . Irregular menstrual cycle 08/09/2012  . Shoulder pain, left 11/07/2011  . H/O vitamin D deficiency 09/02/2011  . Obesity 08/06/2010  . Hypothyroidism 08/06/2010  . Smoking 08/06/2010  . Mental retardation, mild (I.Q. 50-70) 08/06/2010    Orientation RESPIRATION BLADDER Height & Weight     Self, Time, Situation, Place  Normal Indwelling catheter Weight: 203 lb (92.1 kg) Height:  5\' 6"  (167.6 cm)  BEHAVIORAL SYMPTOMS/MOOD NEUROLOGICAL BOWEL NUTRITION STATUS   (None)  (None) Continent Diet (Regular)  AMBULATORY STATUS COMMUNICATION OF NEEDS Skin   Total Care Verbally Surgical wounds                       Personal Care Assistance Level of Assistance              Functional Limitations Info  Sight,  Hearing, Speech Sight Info: Adequate Hearing Info: Adequate Speech Info: Adequate    SPECIAL CARE FACTORS FREQUENCY  PT (By licensed PT)     PT Frequency: 5 x week              Contractures Contractures Info: Not present    Additional Factors Info  Code Status, Allergies, Psychotropic, Isolation Precautions Code Status Info: Full Allergies Info: NKDA Psychotropic Info: Depression, Anxiety: Xanax 0.25 mg PO TID, Celexa 40 mg PO daily   Isolation Precautions Info: Contact     Current Medications (08/29/2016):  This is the current hospital active medication list Current Facility-Administered Medications  Medication Dose Route Frequency Provider Last Rate Last Dose  . 0.9 %  sodium chloride infusion   Intravenous Continuous Kinsinger, Arta Bruce, MD 10 mL/hr at 08/29/16 [phone]    . acetaminophen (TYLENOL) tablet 650 mg  650 mg Oral Q6H Kinsinger, Arta Bruce, MD   650 mg at 08/29/16 [phone]  . ALPRAZolam [name]) tablet 0.25 mg  0.25 mg Oral TID Kinsinger, Arta Bruce, MD   0.25 mg at 08/29/16 1030  . citalopram (CELEXA) tablet 40 mg  40 mg Oral Daily Kinsinger, Arta Bruce, MD   40 mg at 08/29/16 1030  . docusate sodium (COLACE) capsule 100 mg  100 mg Oral BID Kinsinger, Arta Bruce, MD      . enoxaparin (LOVENOX) injection 40 mg  40 mg Subcutaneous Q24H Kinsinger, Arta Bruce, MD   40 mg at 08/29/16 0710  .  gabapentin (NEURONTIN) tablet 600 mg  600 mg Oral TID Kinsinger, Arta Bruce, MD   600 mg at 08/29/16 1030  . hydrALAZINE (APRESOLINE) injection 10 mg  10 mg Intravenous Q2H PRN Kinsinger, Arta Bruce, MD      . insulin aspart (novoLOG) injection 0-15 Units  0-15 Units Subcutaneous TID WC Kinsinger, Arta Bruce, MD      . ketorolac (TORADOL) 30 MG/ML injection 30 mg  30 mg Intravenous Q6H Kinsinger, Arta Bruce, MD      . levothyroxine (SYNTHROID, LEVOTHROID) tablet 88 mcg  88 mcg Oral QAC breakfast Kinsinger, Arta Bruce, MD   88 mcg at 08/29/16 0825  . methocarbamol (ROBAXIN) tablet 1,000  mg  1,000 mg Oral Q6H PRN Kinsinger, Arta Bruce, MD   1,000 mg at 08/29/16 0521  . metoCLOPramide (REGLAN) tablet 10 mg  10 mg Oral Q6H PRN Kinsinger, Arta Bruce, MD      . morphine (MS CONTIN) 12 hr tablet 30 mg  30 mg Oral Q12H Kinsinger, Arta Bruce, MD   30 mg at 08/29/16 0520  . morphine 4 MG/ML injection 2-4 mg  2-4 mg Intravenous Q1H PRN Kinsinger, Arta Bruce, MD   4 mg at 08/29/16 1033  . ondansetron (ZOFRAN-ODT) disintegrating tablet 4 mg  4 mg Oral Q6H PRN Kinsinger, Arta Bruce, MD       Or  . ondansetron [facility]) injection 4 mg  4 mg Intravenous Q6H PRN Kinsinger, Arta Bruce, MD      . oxyCODONE (Oxy IR/ROXICODONE) immediate release tablet 10 mg  10 mg Oral Q4H PRN Kinsinger, Arta Bruce, MD   10 mg at 08/29/16 0709  . pantoprazole (PROTONIX) EC tablet 40 mg  40 mg Oral Daily Kinsinger, Arta Bruce, MD   40 mg at 08/29/16 1030  . polyethylene glycol (MIRALAX / GLYCOLAX) packet 17 g  17 g Oral Daily Kinsinger, Arta Bruce, MD      . simethicone [facility]) chewable tablet 40 mg  40 mg Oral Q6H PRN Kinsinger, Arta Bruce, MD         Discharge Medications: Please see discharge summary for a list of discharge medications.  Relevant Imaging Results:  Relevant Lab Results:   Additional Information SS#: [id number]  Candie Chroman, LCSW

## 2016-08-29 NOTE — Clinical Social Work Note (Signed)
Received consult regarding discharge plan for tomorrow. Admissions coordinator at SNF notified and they can accept her tomorrow.  Dayton Scrape, [place]
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
## 2016-08-29 NOTE — Clinical Social Work Note (Signed)
Clinical Social Work Assessment  Patient Details  Name: [name] MRN: [id number] Date of Birth: [birth year]  Date of referral:  08/29/16               Reason for consult:  Discharge Planning                Permission sought to share information with:  Chartered certified accountant granted to share information::  Yes, Verbal Permission Granted  Name::        Agency::  [facility]  Relationship::     Contact Information:     Housing/Transportation Living arrangements for the past 2 months:  [place] of Information:  Patient, Scientist, water quality, Facility Patient Interpreter Needed:  None Criminal Activity/Legal Involvement Pertinent to Current Situation/Hospitalization:  No - Comment as needed Significant Relationships:  Parents, Siblings Lives with:  Facility Resident Do you feel safe going back to the place where you live?  Yes Need for family participation in patient care:  Yes (Comment)  Care giving concerns:  Patient is a long-term resident at [facility].   Social Worker assessment / plan:  CSW met with patient. No supports at bedside. CSW introduced role and explained that discharge planning would be discussed. Patient confirmed that she is a long-term resident at [facility] and plans to return once discharged. She has been there for about four months and plans to return home once she can walk again. No further concerns. CSW encouraged patient to contact CSW as needed. CSW will continue to follow patient for support and facilitate discharge back to SNF once medically stable.  Employment status:  Disabled (Comment on whether or not currently receiving Disability) Insurance information:  Medicaid [place] PT Recommendations:  Not assessed at this time Information / Referral to community resources:  [employer]  Patient/Family's Response to care:  Patient agreeable to return to SNF. Patient's family supportive and  involved in patient's care. Patient appreciated social work intervention.  Patient/Family's Understanding of and Emotional Response to Diagnosis, Current Treatment, and Prognosis:  Patient has a good understanding of the reason for admission and her need to return to SNF when discharged. CSW does not know prognosis but patient believes that she will walk again. Patient appears pleased with hospital care.  Emotional Assessment Appearance:  Appears stated age Attitude/Demeanor/Rapport:  Other (Depressed) Affect (typically observed):  Accepting, Depressed, Flat Orientation:  Oriented to Self, Oriented to Place, Oriented to  Time, Oriented to Situation Alcohol / Substance use:  Tobacco Use Psych involvement (Current and /or in the community):  No (Comment)  Discharge Needs  Concerns to be addressed:  Care Coordination Readmission within the last 30 days:  No Current discharge risk:  Dependent with Mobility Barriers to Discharge:  Continued Medical Work up   Jamie Chroman, LCSW 08/29/2016, 12:35 PM

## 2016-08-30 LAB — GLUCOSE, CAPILLARY
GLUCOSE-CAPILLARY: 95 mg/dL (ref 65–99)
Glucose-Capillary: 91 mg/dL (ref 65–99)
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
## 2016-08-30 NOTE — Anesthesia Postprocedure Evaluation (Signed)
Anesthesia Post Note  Patient: [name]  Procedure(s) Performed: Procedure(s) (LRB): LAPAROSCOPIC CHOLECYSTECTOMY (N/A)     Patient location during evaluation: PACU Anesthesia Type: General Level of consciousness: awake and alert Pain management: pain level controlled Vital Signs Assessment: post-procedure vital signs reviewed and stable Respiratory status: spontaneous breathing, nonlabored ventilation, respiratory function stable and patient connected to nasal cannula oxygen Cardiovascular status: blood pressure returned to baseline and stable Postop Assessment: no signs of nausea or vomiting Anesthetic complications: no    Last Vitals:  Vitals:   08/30/16 0529 08/30/16 1538  BP: 114/71 125/72  Pulse: (!) 58 88  Resp: 17 17  Temp: 36.7 C 37.1 C    Last Pain:  Vitals:   08/30/16 1538  TempSrc: Oral  PainSc:                  [name]

## 2016-08-30 NOTE — Care Management Note (Signed)
Case Management Note  Patient Details  Name: [name] MRN: [id number] Date of Birth: February 07, 1981  Subjective/Objective:                 Long term care resident at [facility], will return at [place]. No CM needs identified. CSW to facilitate DC to SNF.   Action/Plan:   Expected Discharge Date:  08/30/16               Expected Discharge Plan:  Skilled Nursing Facility  In-House Referral:  Clinical Social Work  Discharge planning Services  CM Consult  Post Acute Care Choice:    Choice offered to:     DME Arranged:    DME Agency:     HH Arranged:    [place] Agency:     Status of Service:  Completed, signed off  If discussed at [employer], dates discussed:    Additional Comments:  Carles Collet, RN 08/30/2016, 9:47 AM

## 2016-08-30 NOTE — Discharge Instructions (Signed)
[facility] ______[facility] °LAPAROSCOPIC SURGERY: POST OP INSTRUCTIONS °Always review your discharge instruction sheet given to you by the facility where your surgery was performed. °IF YOU HAVE DISABILITY OR FAMILY LEAVE FORMS, YOU MUST BRING THEM TO THE OFFICE FOR PROCESSING.   °DO NOT GIVE THEM TO YOUR DOCTOR. ° °1. A prescription for pain medication may be given to you upon discharge.  Take your pain medication as prescribed, if needed.  If narcotic pain medicine is not needed, then you may take acetaminophen (Tylenol) or ibuprofen (Advil) as needed. °2. Take your usually prescribed medications unless otherwise directed. °3. If you need a refill on your pain medication, please contact your pharmacy.  They will contact our office to request authorization. Prescriptions will not be filled after 5pm or on week-ends. °4. You should follow a light diet the first few days after arrival home, such as soup and crackers, etc.  Be sure to include lots of fluids daily. °5. Most patients will experience some swelling and bruising in the area of the incisions.  Ice packs will help.  Swelling and bruising can take several days to resolve.  °6. It is common to experience some constipation if taking pain medication after surgery.  Increasing fluid intake and taking a stool softener (such as Colace) will usually help or prevent this problem from occurring.  A mild laxative (Milk of Magnesia or Miralax) should be taken according to package instructions if there are no bowel movements after 48 hours. °7. Unless discharge instructions indicate otherwise, you may remove your bandages 24-48 hours after surgery, and you may shower at that time.  You may have steri-strips (small skin tapes) in place directly over the incision.  These strips should be left on the skin for 7-10 days.  If your surgeon used skin glue on the incision, you may shower in 24 hours.  The glue will flake off over the next 2-3 weeks.  Any sutures or  staples will be removed at the office during your follow-up visit. °8. ACTIVITIES:  You may resume regular (light) daily activities beginning the next day--such as daily self-care, walking, climbing stairs--gradually increasing activities as tolerated.  You may have sexual intercourse when it is comfortable.  Refrain from any heavy lifting or straining until approved by your doctor. °a. You may drive when you are no longer taking prescription pain medication, you can comfortably wear a seatbelt, and you can safely maneuver your car and apply brakes. °b. RETURN TO WORK:  __________________________________________________________ °9. You should see your doctor in the office for a follow-up appointment approximately 2-3 weeks after your surgery.  Make sure that you call for this appointment within a day or two after you arrive home to insure a convenient appointment time. °10. OTHER INSTRUCTIONS: __________________________________________________________________________________________________________________________ __________________________________________________________________________________________________________________________ °WHEN TO CALL YOUR DOCTOR: °1. Fever over 101.0 °2. Inability to urinate °3. Continued bleeding from incision. °4. Increased pain, redness, or drainage from the incision. °5. Increasing abdominal pain ° °The clinic staff is available to answer your questions during regular business hours.  Please don’t hesitate to call and ask to speak to one of the nurses for clinical concerns.  If you have a medical emergency, go to the nearest emergency room or call 911.  A surgeon from [facility] is always on call at the hospital. °[address], [address], [place], [place]  [address] ? [address] [address], [place],    [address] °[phone] ? [phone] ? [phone] [phone] °Web site:   [email or web address] °

## 2016-08-30 NOTE — Progress Notes (Signed)
Patient refused to have a dressing change at her sacrum at this time and requested for the day shift RN to do it.  Will ask again prior to shift change.

## 2016-08-30 NOTE — Progress Notes (Signed)
PT Cancellation Note  Patient Details Name: [name] MRN: [id number] DOB: 10-04-1980   Cancelled Treatment:    Reason Eval/Treat Not Completed: PT screened, no needs identified, will sign off. Per RN, pt going back to long term skilled facility. Has sacral wound and will not benefit from sitting up in bed. Reports pt is paraplegic. No needs identified at this time. PT will sign off. Please re-order if a new need arises.    Scheryl Marten PT, DPT  [phone]  08/30/2016, 10:53 AM

## 2016-08-30 NOTE — Progress Notes (Signed)
Patient asleep.

## 2016-08-30 NOTE — Progress Notes (Signed)
Clinical Social Worker facilitated patient discharge including contacting patient family and facility to confirm patient discharge plans.  Clinical information faxed to facility and family agreeable with plan.  CSW arranged ambulance transport via [facility] to [facility] .  RN to call [phone] (ask for the 100 hall nurse) for report prior to discharge.  Clinical Social Worker will sign off for now as social work intervention is no longer needed. Please consult [place] again if new need arises.  Rhea Pink, MSW, [place]

## 2016-09-02 ENCOUNTER — Ambulatory Visit: Payer: Medicaid Other | Admitting: Internal Medicine

## 2016-09-05 ENCOUNTER — Encounter (HOSPITAL_BASED_OUTPATIENT_CLINIC_OR_DEPARTMENT_OTHER): Payer: Medicaid Other | Attending: Internal Medicine

## 2016-09-05 DIAGNOSIS — M4628 Osteomyelitis of vertebra, sacral and sacrococcygeal region: Secondary | ICD-10-CM | POA: Insufficient documentation

## 2016-09-05 DIAGNOSIS — L89312 Pressure ulcer of right buttock, stage 2: Secondary | ICD-10-CM | POA: Insufficient documentation

## 2016-09-05 DIAGNOSIS — Y836 Removal of other organ (partial) (total) as the cause of abnormal reaction of the patient, or of later complication, without mention of misadventure at the time of the procedure: Secondary | ICD-10-CM | POA: Insufficient documentation

## 2016-09-05 DIAGNOSIS — L89154 Pressure ulcer of sacral region, stage 4: Secondary | ICD-10-CM | POA: Insufficient documentation

## 2016-09-05 DIAGNOSIS — T8131XA Disruption of external operation (surgical) wound, not elsewhere classified, initial encounter: Secondary | ICD-10-CM | POA: Insufficient documentation

## 2016-09-05 DIAGNOSIS — L97218 Non-pressure chronic ulcer of right calf with other specified severity: Secondary | ICD-10-CM | POA: Insufficient documentation

## 2016-09-05 DIAGNOSIS — G8222 Paraplegia, incomplete: Secondary | ICD-10-CM | POA: Insufficient documentation

## 2016-09-05 DIAGNOSIS — L89624 Pressure ulcer of left heel, stage 4: Secondary | ICD-10-CM | POA: Insufficient documentation

## 2016-09-12 ENCOUNTER — Other Ambulatory Visit (HOSPITAL_COMMUNITY)
Admission: RE | Admit: 2016-09-12 | Discharge: 2016-09-12 | Disposition: A | Discharge status: Home / Self Care | Payer: Medicaid Other | Source: Other Acute Inpatient Hospital | Attending: Internal Medicine | Admitting: Internal Medicine

## 2016-09-12 DIAGNOSIS — T8131XA Disruption of external operation (surgical) wound, not elsewhere classified, initial encounter: Secondary | ICD-10-CM | POA: Diagnosis not present

## 2016-09-12 DIAGNOSIS — Y836 Removal of other organ (partial) (total) as the cause of abnormal reaction of the patient, or of later complication, without mention of misadventure at the time of the procedure: Secondary | ICD-10-CM | POA: Diagnosis not present

## 2016-09-12 DIAGNOSIS — G8222 Paraplegia, incomplete: Secondary | ICD-10-CM | POA: Insufficient documentation

## 2016-09-12 DIAGNOSIS — L89312 Pressure ulcer of right buttock, stage 2: Secondary | ICD-10-CM | POA: Diagnosis not present

## 2016-09-12 DIAGNOSIS — M4628 Osteomyelitis of vertebra, sacral and sacrococcygeal region: Secondary | ICD-10-CM | POA: Diagnosis not present

## 2016-09-12 DIAGNOSIS — L97218 Non-pressure chronic ulcer of right calf with other specified severity: Secondary | ICD-10-CM | POA: Diagnosis not present

## 2016-09-12 DIAGNOSIS — L89154 Pressure ulcer of sacral region, stage 4: Secondary | ICD-10-CM | POA: Diagnosis present

## 2016-09-12 DIAGNOSIS — L89624 Pressure ulcer of left heel, stage 4: Secondary | ICD-10-CM | POA: Diagnosis not present

## 2016-09-15 LAB — AEROBIC CULTURE W GRAM STAIN (SUPERFICIAL SPECIMEN): Culture: NORMAL

## 2016-09-15 LAB — AEROBIC CULTURE  (SUPERFICIAL SPECIMEN)

## 2016-09-26 DIAGNOSIS — L89154 Pressure ulcer of sacral region, stage 4: Secondary | ICD-10-CM | POA: Diagnosis not present

## 2016-10-10 ENCOUNTER — Encounter (HOSPITAL_BASED_OUTPATIENT_CLINIC_OR_DEPARTMENT_OTHER): Payer: Medicaid Other | Attending: Internal Medicine

## 2016-10-10 DIAGNOSIS — L89312 Pressure ulcer of right buttock, stage 2: Secondary | ICD-10-CM | POA: Insufficient documentation

## 2016-10-10 DIAGNOSIS — L89154 Pressure ulcer of sacral region, stage 4: Secondary | ICD-10-CM | POA: Insufficient documentation

## 2016-10-10 DIAGNOSIS — G8222 Paraplegia, incomplete: Secondary | ICD-10-CM | POA: Insufficient documentation

## 2016-10-10 DIAGNOSIS — M4628 Osteomyelitis of vertebra, sacral and sacrococcygeal region: Secondary | ICD-10-CM | POA: Insufficient documentation

## 2016-10-10 DIAGNOSIS — L97812 Non-pressure chronic ulcer of other part of right lower leg with fat layer exposed: Secondary | ICD-10-CM | POA: Insufficient documentation

## 2016-10-13 DIAGNOSIS — L97812 Non-pressure chronic ulcer of other part of right lower leg with fat layer exposed: Secondary | ICD-10-CM | POA: Diagnosis not present

## 2016-10-13 DIAGNOSIS — G8222 Paraplegia, incomplete: Secondary | ICD-10-CM | POA: Diagnosis not present

## 2016-10-13 DIAGNOSIS — L89154 Pressure ulcer of sacral region, stage 4: Secondary | ICD-10-CM | POA: Diagnosis not present

## 2016-10-13 DIAGNOSIS — L89312 Pressure ulcer of right buttock, stage 2: Secondary | ICD-10-CM | POA: Diagnosis not present

## 2016-10-13 DIAGNOSIS — M4628 Osteomyelitis of vertebra, sacral and sacrococcygeal region: Secondary | ICD-10-CM | POA: Diagnosis not present

## 2016-10-27 DIAGNOSIS — L89154 Pressure ulcer of sacral region, stage 4: Secondary | ICD-10-CM | POA: Diagnosis not present

## 2016-11-19 ENCOUNTER — Other Ambulatory Visit (HOSPITAL_COMMUNITY): Payer: Self-pay | Admitting: Neurosurgery

## 2016-11-19 DIAGNOSIS — M542 Cervicalgia: Secondary | ICD-10-CM

## 2016-11-20 ENCOUNTER — Encounter (HOSPITAL_BASED_OUTPATIENT_CLINIC_OR_DEPARTMENT_OTHER): Payer: Medicaid Other | Attending: Internal Medicine

## 2016-11-20 DIAGNOSIS — L89623 Pressure ulcer of left heel, stage 3: Secondary | ICD-10-CM | POA: Diagnosis not present

## 2016-11-20 DIAGNOSIS — M4628 Osteomyelitis of vertebra, sacral and sacrococcygeal region: Secondary | ICD-10-CM | POA: Insufficient documentation

## 2016-11-20 DIAGNOSIS — L97812 Non-pressure chronic ulcer of other part of right lower leg with fat layer exposed: Secondary | ICD-10-CM | POA: Insufficient documentation

## 2016-11-20 DIAGNOSIS — L89154 Pressure ulcer of sacral region, stage 4: Secondary | ICD-10-CM | POA: Insufficient documentation

## 2016-11-20 DIAGNOSIS — G8222 Paraplegia, incomplete: Secondary | ICD-10-CM | POA: Insufficient documentation

## 2016-11-28 ENCOUNTER — Ambulatory Visit (HOSPITAL_COMMUNITY)
Admission: RE | Admit: 2016-11-28 | Discharge: 2016-11-28 | Disposition: A | Discharge status: Home / Self Care | Payer: Medicaid Other | Source: Ambulatory Visit | Attending: Neurosurgery | Admitting: Neurosurgery

## 2016-11-28 DIAGNOSIS — M50323 Other cervical disc degeneration at C6-C7 level: Secondary | ICD-10-CM | POA: Diagnosis not present

## 2016-11-28 DIAGNOSIS — M542 Cervicalgia: Secondary | ICD-10-CM

## 2016-11-28 DIAGNOSIS — M50223 Other cervical disc displacement at C6-C7 level: Secondary | ICD-10-CM | POA: Diagnosis not present

## 2016-11-28 DIAGNOSIS — M4802 Spinal stenosis, cervical region: Secondary | ICD-10-CM | POA: Insufficient documentation

## 2016-12-11 ENCOUNTER — Encounter (HOSPITAL_BASED_OUTPATIENT_CLINIC_OR_DEPARTMENT_OTHER): Payer: Medicaid Other | Attending: Internal Medicine

## 2016-12-11 DIAGNOSIS — L97219 Non-pressure chronic ulcer of right calf with unspecified severity: Secondary | ICD-10-CM | POA: Diagnosis not present

## 2016-12-11 DIAGNOSIS — L89623 Pressure ulcer of left heel, stage 3: Secondary | ICD-10-CM | POA: Insufficient documentation

## 2016-12-11 DIAGNOSIS — M4628 Osteomyelitis of vertebra, sacral and sacrococcygeal region: Secondary | ICD-10-CM | POA: Diagnosis not present

## 2016-12-11 DIAGNOSIS — G8222 Paraplegia, incomplete: Secondary | ICD-10-CM | POA: Insufficient documentation

## 2016-12-11 DIAGNOSIS — L89154 Pressure ulcer of sacral region, stage 4: Secondary | ICD-10-CM | POA: Diagnosis not present

## 2017-01-01 ENCOUNTER — Encounter (HOSPITAL_BASED_OUTPATIENT_CLINIC_OR_DEPARTMENT_OTHER): Payer: Medicaid Other | Attending: Internal Medicine

## 2017-01-01 DIAGNOSIS — T8131XA Disruption of external operation (surgical) wound, not elsewhere classified, initial encounter: Secondary | ICD-10-CM | POA: Insufficient documentation

## 2017-01-01 DIAGNOSIS — G8222 Paraplegia, incomplete: Secondary | ICD-10-CM | POA: Insufficient documentation

## 2017-01-01 DIAGNOSIS — Y838 Other surgical procedures as the cause of abnormal reaction of the patient, or of later complication, without mention of misadventure at the time of the procedure: Secondary | ICD-10-CM | POA: Insufficient documentation

## 2017-01-01 DIAGNOSIS — L89154 Pressure ulcer of sacral region, stage 4: Secondary | ICD-10-CM | POA: Insufficient documentation

## 2017-01-01 DIAGNOSIS — L97312 Non-pressure chronic ulcer of right 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: Secondary | ICD-10-CM | POA: Insufficient documentation

## 2017-01-01 DIAGNOSIS — L97322 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: Secondary | ICD-10-CM | POA: Insufficient documentation

## 2017-01-01 DIAGNOSIS — M4628 Osteomyelitis of vertebra, sacral and sacrococcygeal region: Secondary | ICD-10-CM | POA: Insufficient documentation

## 2017-01-01 DIAGNOSIS — L97522 Non-pressure chronic ulcer of other part of left foot with fat layer exposed: Secondary | ICD-10-CM | POA: Insufficient documentation

## 2017-01-26 DIAGNOSIS — T8131XA Disruption of external operation (surgical) wound, not elsewhere classified, initial encounter: Secondary | ICD-10-CM | POA: Diagnosis not present

## 2017-01-26 DIAGNOSIS — L97322 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: Secondary | ICD-10-CM | POA: Diagnosis not present

## 2017-01-26 DIAGNOSIS — L89154 Pressure ulcer of sacral region, stage 4: Secondary | ICD-10-CM | POA: Diagnosis not present

## 2017-01-26 DIAGNOSIS — G8222 Paraplegia, incomplete: Secondary | ICD-10-CM | POA: Diagnosis not present

## 2017-01-26 DIAGNOSIS — L97312 Non-pressure chronic ulcer of right 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: Secondary | ICD-10-CM | POA: Diagnosis not present

## 2017-01-26 DIAGNOSIS — Y838 Other surgical procedures as the cause of abnormal reaction of the patient, or of later complication, without mention of misadventure at the time of the procedure: Secondary | ICD-10-CM | POA: Diagnosis not present

## 2017-01-26 DIAGNOSIS — M4628 Osteomyelitis of vertebra, sacral and sacrococcygeal region: Secondary | ICD-10-CM | POA: Diagnosis not present

## 2017-01-26 DIAGNOSIS — L97522 Non-pressure chronic ulcer of other part of left foot with fat layer exposed: Secondary | ICD-10-CM | POA: Diagnosis not present

## 2017-02-02 ENCOUNTER — Encounter (HOSPITAL_BASED_OUTPATIENT_CLINIC_OR_DEPARTMENT_OTHER): Payer: Medicaid Other | Attending: Internal Medicine

## 2017-02-02 DIAGNOSIS — S81802A Unspecified open wound, left lower leg, initial encounter: Secondary | ICD-10-CM | POA: Diagnosis not present

## 2017-02-02 DIAGNOSIS — S81801A Unspecified open wound, right lower leg, initial encounter: Secondary | ICD-10-CM | POA: Diagnosis not present

## 2017-02-02 DIAGNOSIS — L89154 Pressure ulcer of sacral region, stage 4: Secondary | ICD-10-CM | POA: Insufficient documentation

## 2017-02-02 DIAGNOSIS — W228XXA Striking against or struck by other objects, initial encounter: Secondary | ICD-10-CM | POA: Insufficient documentation

## 2017-02-02 DIAGNOSIS — M4628 Osteomyelitis of vertebra, sacral and sacrococcygeal region: Secondary | ICD-10-CM | POA: Insufficient documentation

## 2017-02-02 DIAGNOSIS — G8222 Paraplegia, incomplete: Secondary | ICD-10-CM | POA: Insufficient documentation

## 2017-02-02 DIAGNOSIS — S91102A Unspecified open wound of left great toe without damage to nail, initial encounter: Secondary | ICD-10-CM | POA: Diagnosis not present

## 2017-02-16 DIAGNOSIS — L89154 Pressure ulcer of sacral region, stage 4: Secondary | ICD-10-CM | POA: Diagnosis not present

## 2017-03-16 ENCOUNTER — Encounter (HOSPITAL_BASED_OUTPATIENT_CLINIC_OR_DEPARTMENT_OTHER): Payer: Medicaid Other | Attending: Internal Medicine

## 2017-03-16 DIAGNOSIS — L89154 Pressure ulcer of sacral region, stage 4: Secondary | ICD-10-CM | POA: Insufficient documentation

## 2017-03-16 DIAGNOSIS — L97822 Non-pressure chronic ulcer of other part of left lower leg with fat layer exposed: Secondary | ICD-10-CM | POA: Insufficient documentation

## 2017-03-23 DIAGNOSIS — L89154 Pressure ulcer of sacral region, stage 4: Secondary | ICD-10-CM | POA: Diagnosis present

## 2017-03-23 DIAGNOSIS — L97822 Non-pressure chronic ulcer of other part of left lower leg with fat layer exposed: Secondary | ICD-10-CM | POA: Diagnosis not present

## 2017-04-20 ENCOUNTER — Encounter (HOSPITAL_BASED_OUTPATIENT_CLINIC_OR_DEPARTMENT_OTHER): Payer: Medicaid Other | Attending: Internal Medicine

## 2017-05-07 ENCOUNTER — Encounter (HOSPITAL_BASED_OUTPATIENT_CLINIC_OR_DEPARTMENT_OTHER): Payer: Medicaid Other | Attending: Internal Medicine

## 2017-05-07 DIAGNOSIS — G8222 Paraplegia, incomplete: Secondary | ICD-10-CM | POA: Insufficient documentation

## 2017-05-07 DIAGNOSIS — L97422 Non-pressure chronic ulcer of left heel and midfoot with fat layer exposed: Secondary | ICD-10-CM | POA: Insufficient documentation

## 2017-05-07 DIAGNOSIS — L89154 Pressure ulcer of sacral region, stage 4: Secondary | ICD-10-CM | POA: Insufficient documentation

## 2017-05-13 DIAGNOSIS — G8222 Paraplegia, incomplete: Secondary | ICD-10-CM | POA: Diagnosis not present

## 2017-05-13 DIAGNOSIS — L97422 Non-pressure chronic ulcer of left heel and midfoot with fat layer exposed: Secondary | ICD-10-CM | POA: Diagnosis not present

## 2017-05-13 DIAGNOSIS — L89154 Pressure ulcer of sacral region, stage 4: Secondary | ICD-10-CM | POA: Diagnosis not present

## 2017-06-10 ENCOUNTER — Encounter (HOSPITAL_BASED_OUTPATIENT_CLINIC_OR_DEPARTMENT_OTHER): Payer: Medicaid Other | Attending: Physician Assistant

## 2017-06-10 DIAGNOSIS — G8222 Paraplegia, incomplete: Secondary | ICD-10-CM | POA: Insufficient documentation

## 2017-06-10 DIAGNOSIS — L89154 Pressure ulcer of sacral region, stage 4: Secondary | ICD-10-CM | POA: Diagnosis not present

## 2017-06-10 DIAGNOSIS — L89620 Pressure ulcer of left heel, unstageable: Secondary | ICD-10-CM | POA: Diagnosis not present

## 2017-07-08 ENCOUNTER — Encounter (HOSPITAL_BASED_OUTPATIENT_CLINIC_OR_DEPARTMENT_OTHER): Payer: Medicaid Other | Attending: Internal Medicine

## 2017-07-08 DIAGNOSIS — G8222 Paraplegia, incomplete: Secondary | ICD-10-CM | POA: Insufficient documentation

## 2017-07-08 DIAGNOSIS — L89154 Pressure ulcer of sacral region, stage 4: Secondary | ICD-10-CM | POA: Diagnosis not present

## 2017-07-08 DIAGNOSIS — L89620 Pressure ulcer of left heel, unstageable: Secondary | ICD-10-CM | POA: Insufficient documentation

## 2017-08-05 ENCOUNTER — Encounter (HOSPITAL_BASED_OUTPATIENT_CLINIC_OR_DEPARTMENT_OTHER): Payer: Medicaid Other | Attending: Physician Assistant

## 2017-08-05 DIAGNOSIS — L89620 Pressure ulcer of left heel, unstageable: Secondary | ICD-10-CM | POA: Insufficient documentation

## 2017-08-05 DIAGNOSIS — G8222 Paraplegia, incomplete: Secondary | ICD-10-CM | POA: Insufficient documentation

## 2017-08-05 DIAGNOSIS — L89154 Pressure ulcer of sacral region, stage 4: Secondary | ICD-10-CM | POA: Insufficient documentation

## 2017-08-10 DIAGNOSIS — L89620 Pressure ulcer of left heel, unstageable: Secondary | ICD-10-CM | POA: Diagnosis not present

## 2017-08-10 DIAGNOSIS — L89154 Pressure ulcer of sacral region, stage 4: Secondary | ICD-10-CM | POA: Diagnosis present

## 2017-08-10 DIAGNOSIS — G8222 Paraplegia, incomplete: Secondary | ICD-10-CM | POA: Diagnosis not present

## 2017-09-07 ENCOUNTER — Encounter (HOSPITAL_BASED_OUTPATIENT_CLINIC_OR_DEPARTMENT_OTHER): Payer: Medicaid Other | Attending: Internal Medicine

## 2017-09-07 ENCOUNTER — Encounter (HOSPITAL_BASED_OUTPATIENT_CLINIC_OR_DEPARTMENT_OTHER): Payer: Self-pay

## 2017-09-07 DIAGNOSIS — G8222 Paraplegia, incomplete: Secondary | ICD-10-CM | POA: Insufficient documentation

## 2017-09-07 DIAGNOSIS — L89620 Pressure ulcer of left heel, unstageable: Secondary | ICD-10-CM | POA: Insufficient documentation

## 2017-09-07 DIAGNOSIS — L89154 Pressure ulcer of sacral region, stage 4: Secondary | ICD-10-CM | POA: Insufficient documentation

## 2017-09-14 DIAGNOSIS — L89154 Pressure ulcer of sacral region, stage 4: Secondary | ICD-10-CM | POA: Diagnosis present

## 2017-09-14 DIAGNOSIS — G8222 Paraplegia, incomplete: Secondary | ICD-10-CM | POA: Diagnosis not present

## 2017-09-14 DIAGNOSIS — L89620 Pressure ulcer of left heel, unstageable: Secondary | ICD-10-CM | POA: Diagnosis not present

## 2017-10-12 ENCOUNTER — Encounter (HOSPITAL_COMMUNITY): Payer: Self-pay | Admitting: Emergency Medicine

## 2017-10-12 ENCOUNTER — Emergency Department (HOSPITAL_COMMUNITY)
Admission: EM | Admit: 2017-10-12 | Discharge: 2017-10-13 | Disposition: A | Discharge status: Home / Self Care | Payer: Medicaid Other | Attending: Emergency Medicine | Admitting: Emergency Medicine

## 2017-10-12 ENCOUNTER — Other Ambulatory Visit: Payer: Self-pay

## 2017-10-12 ENCOUNTER — Encounter (HOSPITAL_BASED_OUTPATIENT_CLINIC_OR_DEPARTMENT_OTHER): Payer: Medicaid Other | Attending: Internal Medicine

## 2017-10-12 DIAGNOSIS — S3992XA Unspecified injury of lower back, initial encounter: Secondary | ICD-10-CM | POA: Diagnosis present

## 2017-10-12 DIAGNOSIS — Y92199 Unspecified place in other specified residential institution as the place of occurrence of the external cause: Secondary | ICD-10-CM | POA: Insufficient documentation

## 2017-10-12 DIAGNOSIS — E039 Hypothyroidism, unspecified: Secondary | ICD-10-CM | POA: Insufficient documentation

## 2017-10-12 DIAGNOSIS — F17210 Nicotine dependence, cigarettes, uncomplicated: Secondary | ICD-10-CM | POA: Insufficient documentation

## 2017-10-12 DIAGNOSIS — L89620 Pressure ulcer of left heel, unstageable: Secondary | ICD-10-CM | POA: Insufficient documentation

## 2017-10-12 DIAGNOSIS — W050XXA Fall from non-moving wheelchair, initial encounter: Secondary | ICD-10-CM | POA: Insufficient documentation

## 2017-10-12 DIAGNOSIS — Z79899 Other long term (current) drug therapy: Secondary | ICD-10-CM | POA: Insufficient documentation

## 2017-10-12 DIAGNOSIS — G8220 Paraplegia, unspecified: Secondary | ICD-10-CM | POA: Insufficient documentation

## 2017-10-12 DIAGNOSIS — Z7982 Long term (current) use of aspirin: Secondary | ICD-10-CM | POA: Insufficient documentation

## 2017-10-12 DIAGNOSIS — S39012A Strain of muscle, fascia and tendon of lower back, initial encounter: Secondary | ICD-10-CM | POA: Diagnosis not present

## 2017-10-12 DIAGNOSIS — L89154 Pressure ulcer of sacral region, stage 4: Secondary | ICD-10-CM | POA: Insufficient documentation

## 2017-10-12 DIAGNOSIS — W19XXXA Unspecified fall, initial encounter: Secondary | ICD-10-CM

## 2017-10-12 DIAGNOSIS — Z8669 Personal history of other diseases of the nervous system and sense organs: Secondary | ICD-10-CM

## 2017-10-12 DIAGNOSIS — G8222 Paraplegia, incomplete: Secondary | ICD-10-CM | POA: Insufficient documentation

## 2017-10-12 DIAGNOSIS — S8000XA Contusion of unspecified knee, initial encounter: Secondary | ICD-10-CM

## 2017-10-12 DIAGNOSIS — Y999 Unspecified external cause status: Secondary | ICD-10-CM | POA: Insufficient documentation

## 2017-10-12 DIAGNOSIS — Y9389 Activity, other specified: Secondary | ICD-10-CM | POA: Diagnosis not present

## 2017-10-12 NOTE — ED Triage Notes (Signed)
Pt fell out of wheelchair today and c/o back and neck pain.

## 2017-10-13 ENCOUNTER — Emergency Department (HOSPITAL_COMMUNITY): Payer: Medicaid Other

## 2017-10-13 MED ORDER — OXYCODONE-ACETAMINOPHEN 5-325 MG PO TABS
2.0000 | ORAL_TABLET | Freq: Once | ORAL | Status: DC
  Filled 2017-10-13: qty 2

## 2017-10-13 MED ORDER — HYDROMORPHONE HCL 2 MG/ML IJ SOLN
2.0000 mg | Freq: Once | INTRAMUSCULAR | Status: AC
  Administered 2017-10-13: 2 mg via INTRAMUSCULAR
  Filled 2017-10-13: qty 1

## 2017-10-13 MED ORDER — HYDROMORPHONE HCL 2 MG/ML IJ SOLN
2.0000 mg | Freq: Once | INTRAMUSCULAR | Status: AC
Start: 2017-10-13 — End: 2017-10-13
  Administered 2017-10-13: 2 mg via INTRAMUSCULAR
  Filled 2017-10-13 (×2): qty 1

## 2017-10-13 NOTE — ED Notes (Signed)
[facility] notified of patient needing transportation home.
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
## 2017-10-13 NOTE — Discharge Instructions (Addendum)
Follow-up with your primary doctor if you experience additional problems.

## 2017-10-23 DIAGNOSIS — L89154 Pressure ulcer of sacral region, stage 4: Secondary | ICD-10-CM | POA: Diagnosis present

## 2017-10-23 DIAGNOSIS — G8222 Paraplegia, incomplete: Secondary | ICD-10-CM | POA: Diagnosis not present

## 2017-10-23 DIAGNOSIS — L89620 Pressure ulcer of left heel, unstageable: Secondary | ICD-10-CM | POA: Diagnosis not present

## 2017-11-06 ENCOUNTER — Encounter (HOSPITAL_BASED_OUTPATIENT_CLINIC_OR_DEPARTMENT_OTHER): Payer: Medicaid Other | Attending: Internal Medicine

## 2017-11-06 DIAGNOSIS — L89620 Pressure ulcer of left heel, unstageable: Secondary | ICD-10-CM | POA: Diagnosis not present

## 2017-11-06 DIAGNOSIS — L97521 Non-pressure chronic ulcer of 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: Secondary | ICD-10-CM | POA: Diagnosis not present

## 2017-11-06 DIAGNOSIS — L97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: Secondary | ICD-10-CM | POA: Diagnosis not present

## 2017-11-06 DIAGNOSIS — G8222 Paraplegia, incomplete: Secondary | ICD-10-CM | POA: Insufficient documentation

## 2017-11-06 DIAGNOSIS — L89154 Pressure ulcer of sacral region, stage 4: Secondary | ICD-10-CM | POA: Insufficient documentation

## 2017-11-20 DIAGNOSIS — L89154 Pressure ulcer of sacral region, stage 4: Secondary | ICD-10-CM | POA: Diagnosis not present

## 2017-12-04 ENCOUNTER — Encounter (HOSPITAL_BASED_OUTPATIENT_CLINIC_OR_DEPARTMENT_OTHER): Payer: Medicaid Other | Attending: Internal Medicine

## 2017-12-04 DIAGNOSIS — L89620 Pressure ulcer of left heel, unstageable: Secondary | ICD-10-CM | POA: Insufficient documentation

## 2017-12-04 DIAGNOSIS — S80812A Abrasion, left lower leg, initial encounter: Secondary | ICD-10-CM | POA: Insufficient documentation

## 2017-12-04 DIAGNOSIS — L89154 Pressure ulcer of sacral region, stage 4: Secondary | ICD-10-CM | POA: Insufficient documentation

## 2017-12-04 DIAGNOSIS — X58XXXA Exposure to other specified factors, initial encounter: Secondary | ICD-10-CM | POA: Insufficient documentation

## 2017-12-17 DIAGNOSIS — X58XXXA Exposure to other specified factors, initial encounter: Secondary | ICD-10-CM | POA: Diagnosis not present

## 2017-12-17 DIAGNOSIS — L89154 Pressure ulcer of sacral region, stage 4: Secondary | ICD-10-CM | POA: Diagnosis not present

## 2017-12-17 DIAGNOSIS — L89620 Pressure ulcer of left heel, unstageable: Secondary | ICD-10-CM | POA: Diagnosis not present

## 2017-12-17 DIAGNOSIS — S80812A Abrasion, left lower leg, initial encounter: Secondary | ICD-10-CM | POA: Diagnosis not present

## 2017-12-31 DIAGNOSIS — L89154 Pressure ulcer of sacral region, stage 4: Secondary | ICD-10-CM | POA: Diagnosis not present

## 2018-01-21 ENCOUNTER — Encounter (HOSPITAL_BASED_OUTPATIENT_CLINIC_OR_DEPARTMENT_OTHER): Payer: Medicaid Other | Attending: Internal Medicine

## 2018-01-21 DIAGNOSIS — G8222 Paraplegia, incomplete: Secondary | ICD-10-CM | POA: Diagnosis not present

## 2018-01-21 DIAGNOSIS — L89154 Pressure ulcer of sacral region, stage 4: Secondary | ICD-10-CM | POA: Diagnosis present

## 2018-01-21 DIAGNOSIS — L89620 Pressure ulcer of left heel, unstageable: Secondary | ICD-10-CM | POA: Insufficient documentation

## 2018-02-04 ENCOUNTER — Encounter (HOSPITAL_BASED_OUTPATIENT_CLINIC_OR_DEPARTMENT_OTHER): Payer: Medicaid Other | Attending: Internal Medicine

## 2018-02-04 DIAGNOSIS — G8222 Paraplegia, incomplete: Secondary | ICD-10-CM | POA: Insufficient documentation

## 2018-02-04 DIAGNOSIS — L89154 Pressure ulcer of sacral region, stage 4: Secondary | ICD-10-CM | POA: Insufficient documentation

## 2018-02-11 DIAGNOSIS — G8222 Paraplegia, incomplete: Secondary | ICD-10-CM | POA: Diagnosis not present

## 2018-02-11 DIAGNOSIS — L89154 Pressure ulcer of sacral region, stage 4: Secondary | ICD-10-CM | POA: Diagnosis not present

## 2018-03-04 ENCOUNTER — Encounter: Payer: Self-pay | Admitting: *Deleted

## 2018-03-11 ENCOUNTER — Encounter (HOSPITAL_BASED_OUTPATIENT_CLINIC_OR_DEPARTMENT_OTHER): Payer: Medicaid Other | Attending: Internal Medicine

## 2018-03-11 DIAGNOSIS — L89154 Pressure ulcer of sacral region, stage 4: Secondary | ICD-10-CM | POA: Insufficient documentation

## 2018-03-18 ENCOUNTER — Encounter: Payer: Medicaid Other | Admitting: Obstetrics & Gynecology

## 2018-03-18 DIAGNOSIS — L89154 Pressure ulcer of sacral region, stage 4: Secondary | ICD-10-CM | POA: Diagnosis not present

## 2018-04-07 ENCOUNTER — Encounter: Payer: Medicaid Other | Admitting: Family Medicine

## 2018-04-08 ENCOUNTER — Encounter (HOSPITAL_BASED_OUTPATIENT_CLINIC_OR_DEPARTMENT_OTHER): Payer: Medicaid Other | Attending: Internal Medicine

## 2018-04-08 ENCOUNTER — Encounter (HOSPITAL_BASED_OUTPATIENT_CLINIC_OR_DEPARTMENT_OTHER): Payer: Self-pay

## 2018-04-08 DIAGNOSIS — L89154 Pressure ulcer of sacral region, stage 4: Secondary | ICD-10-CM | POA: Insufficient documentation

## 2018-04-15 DIAGNOSIS — L89154 Pressure ulcer of sacral region, stage 4: Secondary | ICD-10-CM | POA: Diagnosis not present

## 2018-05-03 ENCOUNTER — Inpatient Hospital Stay (HOSPITAL_COMMUNITY)
Admission: EM | Admit: 2018-05-03 | Discharge: 2018-05-06 | DRG: 698 | Disposition: A | Discharge status: Home Health | Payer: Medicaid Other | Attending: Internal Medicine | Admitting: Internal Medicine

## 2018-05-03 ENCOUNTER — Encounter (HOSPITAL_COMMUNITY): Payer: Self-pay | Admitting: *Deleted

## 2018-05-03 DIAGNOSIS — G8220 Paraplegia, unspecified: Secondary | ICD-10-CM | POA: Diagnosis present

## 2018-05-03 DIAGNOSIS — E039 Hypothyroidism, unspecified: Secondary | ICD-10-CM | POA: Diagnosis present

## 2018-05-03 DIAGNOSIS — N3091 Cystitis, unspecified with hematuria: Secondary | ICD-10-CM | POA: Diagnosis present

## 2018-05-03 DIAGNOSIS — B359 Dermatophytosis, unspecified: Secondary | ICD-10-CM | POA: Diagnosis present

## 2018-05-03 DIAGNOSIS — Z79890 Hormone replacement therapy (postmenopausal): Secondary | ICD-10-CM

## 2018-05-03 DIAGNOSIS — F17210 Nicotine dependence, cigarettes, uncomplicated: Secondary | ICD-10-CM | POA: Diagnosis present

## 2018-05-03 DIAGNOSIS — Z8249 Family history of ischemic heart disease and other diseases of the circulatory system: Secondary | ICD-10-CM

## 2018-05-03 DIAGNOSIS — T83518A Infection and inflammatory reaction due to other urinary catheter, initial encounter: Principal | ICD-10-CM | POA: Diagnosis present

## 2018-05-03 DIAGNOSIS — M8668 Other chronic osteomyelitis, other site: Secondary | ICD-10-CM | POA: Diagnosis present

## 2018-05-03 DIAGNOSIS — Y846 Urinary catheterization as the cause of abnormal reaction of the patient, or of later complication, without mention of misadventure at the time of the procedure: Secondary | ICD-10-CM | POA: Diagnosis present

## 2018-05-03 DIAGNOSIS — Z79891 Long term (current) use of opiate analgesic: Secondary | ICD-10-CM

## 2018-05-03 DIAGNOSIS — L89156 Pressure-induced deep tissue damage of sacral region: Secondary | ICD-10-CM

## 2018-05-03 DIAGNOSIS — Z981 Arthrodesis status: Secondary | ICD-10-CM

## 2018-05-03 DIAGNOSIS — M4628 Osteomyelitis of vertebra, sacral and sacrococcygeal region: Secondary | ICD-10-CM | POA: Diagnosis present

## 2018-05-03 DIAGNOSIS — L89154 Pressure ulcer of sacral region, stage 4: Secondary | ICD-10-CM | POA: Diagnosis present

## 2018-05-03 DIAGNOSIS — Z79899 Other long term (current) drug therapy: Secondary | ICD-10-CM

## 2018-05-03 DIAGNOSIS — K219 Gastro-esophageal reflux disease without esophagitis: Secondary | ICD-10-CM | POA: Diagnosis present

## 2018-05-03 DIAGNOSIS — G894 Chronic pain syndrome: Secondary | ICD-10-CM | POA: Diagnosis present

## 2018-05-03 DIAGNOSIS — G8929 Other chronic pain: Secondary | ICD-10-CM | POA: Diagnosis present

## 2018-05-03 DIAGNOSIS — N390 Urinary tract infection, site not specified: Secondary | ICD-10-CM | POA: Diagnosis present

## 2018-05-03 DIAGNOSIS — T1490XS Injury, unspecified, sequela: Secondary | ICD-10-CM | POA: Diagnosis present

## 2018-05-03 DIAGNOSIS — F418 Other specified anxiety disorders: Secondary | ICD-10-CM | POA: Diagnosis present

## 2018-05-03 DIAGNOSIS — K529 Noninfective gastroenteritis and colitis, unspecified: Secondary | ICD-10-CM | POA: Diagnosis present

## 2018-05-03 DIAGNOSIS — Z9049 Acquired absence of other specified parts of digestive tract: Secondary | ICD-10-CM

## 2018-05-03 DIAGNOSIS — R197 Diarrhea, unspecified: Secondary | ICD-10-CM

## 2018-05-03 DIAGNOSIS — Z6834 Body mass index (BMI) 34.0-34.9, adult: Secondary | ICD-10-CM

## 2018-05-03 DIAGNOSIS — E876 Hypokalemia: Secondary | ICD-10-CM | POA: Diagnosis present

## 2018-05-03 MED ORDER — SODIUM CHLORIDE 0.9 % IV BOLUS
1000.0000 mL | Freq: Once | INTRAVENOUS | Status: AC
  Administered 2018-05-04: 1000 mL via INTRAVENOUS

## 2018-05-03 MED ORDER — ONDANSETRON HCL 4 MG/2ML IJ SOLN
4.0000 mg | Freq: Once | INTRAMUSCULAR | Status: AC
  Administered 2018-05-04: 4 mg via INTRAVENOUS
  Filled 2018-05-03: qty 2

## 2018-05-03 NOTE — ED Triage Notes (Signed)
Pt brought in by [facility] for c/o n/v/d; pt is a paraplegic and is c/o a pressure ulcer to her sacrum that she wants to be checked out
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
## 2018-05-04 ENCOUNTER — Other Ambulatory Visit: Payer: Self-pay

## 2018-05-04 ENCOUNTER — Encounter (HOSPITAL_COMMUNITY): Payer: Self-pay | Admitting: Family Medicine

## 2018-05-04 ENCOUNTER — Emergency Department (HOSPITAL_COMMUNITY): Payer: Medicaid Other

## 2018-05-04 DIAGNOSIS — N390 Urinary tract infection, site not specified: Secondary | ICD-10-CM | POA: Diagnosis not present

## 2018-05-04 DIAGNOSIS — E039 Hypothyroidism, unspecified: Secondary | ICD-10-CM

## 2018-05-04 DIAGNOSIS — G8220 Paraplegia, unspecified: Secondary | ICD-10-CM

## 2018-05-04 DIAGNOSIS — B359 Dermatophytosis, unspecified: Secondary | ICD-10-CM

## 2018-05-04 DIAGNOSIS — G8929 Other chronic pain: Secondary | ICD-10-CM | POA: Diagnosis present

## 2018-05-04 DIAGNOSIS — K529 Noninfective gastroenteritis and colitis, unspecified: Secondary | ICD-10-CM | POA: Diagnosis not present

## 2018-05-04 DIAGNOSIS — M4628 Osteomyelitis of vertebra, sacral and sacrococcygeal region: Secondary | ICD-10-CM

## 2018-05-04 DIAGNOSIS — F418 Other specified anxiety disorders: Secondary | ICD-10-CM

## 2018-05-04 DIAGNOSIS — T1490XS Injury, unspecified, sequela: Secondary | ICD-10-CM

## 2018-05-04 DIAGNOSIS — G894 Chronic pain syndrome: Secondary | ICD-10-CM | POA: Diagnosis not present

## 2018-05-04 DIAGNOSIS — E876 Hypokalemia: Secondary | ICD-10-CM | POA: Diagnosis present

## 2018-05-04 DIAGNOSIS — R197 Diarrhea, unspecified: Secondary | ICD-10-CM

## 2018-05-04 DIAGNOSIS — L89156 Pressure-induced deep tissue damage of sacral region: Secondary | ICD-10-CM

## 2018-05-04 DIAGNOSIS — E038 Other specified hypothyroidism: Secondary | ICD-10-CM

## 2018-05-04 LAB — CBC
HCT: 38.7 % (ref 36.0–46.0)
Hemoglobin: 11.7 g/dL — ABNORMAL LOW (ref 12.0–15.0)
MCH: 26.8 pg (ref 26.0–34.0)
MCHC: 30.2 g/dL (ref 30.0–36.0)
MCV: 88.6 fL (ref 80.0–100.0)
NRBC: 0 % (ref 0.0–0.2)
Platelets: 635 10*3/uL — ABNORMAL HIGH (ref 150–400)
RBC: 4.37 MIL/uL (ref 3.87–5.11)
RDW: 15.8 % — ABNORMAL HIGH (ref 11.5–15.5)
WBC: 17.9 10*3/uL — ABNORMAL HIGH (ref 4.0–10.5)

## 2018-05-04 LAB — CBC WITH DIFFERENTIAL/PLATELET
Abs Immature Granulocytes: 0.07 10*3/uL (ref 0.00–0.07)
BASOS ABS: 0.1 10*3/uL (ref 0.0–0.1)
BASOS PCT: 0 %
EOS ABS: 0.4 10*3/uL (ref 0.0–0.5)
EOS PCT: 2 %
HEMATOCRIT: 43 % (ref 36.0–46.0)
Hemoglobin: 13.5 g/dL (ref 12.0–15.0)
IMMATURE GRANULOCYTES: 0 %
Lymphocytes Relative: 30 %
Lymphs Abs: 5.9 10*3/uL — ABNORMAL HIGH (ref 0.7–4.0)
MCH: 27.1 pg (ref 26.0–34.0)
MCHC: 31.4 g/dL (ref 30.0–36.0)
MCV: 86.3 fL (ref 80.0–100.0)
Monocytes Absolute: 1.3 10*3/uL — ABNORMAL HIGH (ref 0.1–1.0)
Monocytes Relative: 7 %
NEUTROS PCT: 61 %
NRBC: 0 % (ref 0.0–0.2)
Neutro Abs: 12.2 10*3/uL — ABNORMAL HIGH (ref 1.7–7.7)
PLATELETS: 762 10*3/uL — AB (ref 150–400)
RBC: 4.98 MIL/uL (ref 3.87–5.11)
RDW: 15.9 % — AB (ref 11.5–15.5)
WBC: 19.9 10*3/uL — AB (ref 4.0–10.5)

## 2018-05-04 LAB — BASIC METABOLIC PANEL
Anion gap: 7 (ref 5–15)
BUN: 6 mg/dL (ref 6–20)
CO2: 24 mmol/L (ref 22–32)
Calcium: 8.5 mg/dL — ABNORMAL LOW (ref 8.9–10.3)
Chloride: 109 mmol/L (ref 98–111)
Creatinine, Ser: 0.43 mg/dL — ABNORMAL LOW (ref 0.44–1.00)
GFR calc non Af Amer: >60 mL/min (ref >60)
Glucose, Bld: 99 mg/dL (ref 70–99)
POTASSIUM: 3.1 mmol/L — AB (ref 3.5–5.1)
Sodium: 140 mmol/L (ref 135–145)

## 2018-05-04 LAB — URINALYSIS, ROUTINE W REFLEX MICROSCOPIC
Bilirubin Urine: NEGATIVE
Glucose, UA: NEGATIVE mg/dL
Ketones, ur: NEGATIVE mg/dL
Nitrite: POSITIVE — AB
Protein, ur: 100 mg/dL — AB
Specific Gravity, Urine: 1.012 (ref 1.005–1.030)
pH: 7 (ref 5.0–8.0)

## 2018-05-04 LAB — COMPREHENSIVE METABOLIC PANEL
ALT: 9 U/L (ref 0–44)
AST: 13 U/L — ABNORMAL LOW (ref 15–41)
Albumin: 3.7 g/dL (ref 3.5–5.0)
Alkaline Phosphatase: 63 U/L (ref 38–126)
Anion gap: 9 (ref 5–15)
BILIRUBIN TOTAL: 0.4 mg/dL (ref 0.3–1.2)
BUN: 6 mg/dL (ref 6–20)
CO2: 24 mmol/L (ref 22–32)
Calcium: 8.7 mg/dL — ABNORMAL LOW (ref 8.9–10.3)
Chloride: 107 mmol/L (ref 98–111)
Creatinine, Ser: 0.37 mg/dL — ABNORMAL LOW (ref 0.44–1.00)
GFR calc Af Amer: >60 mL/min (ref >60)
GFR calc non Af Amer: >60 mL/min (ref >60)
Glucose, Bld: 103 mg/dL — ABNORMAL HIGH (ref 70–99)
Potassium: 3.1 mmol/L — ABNORMAL LOW (ref 3.5–5.1)
Sodium: 140 mmol/L (ref 135–145)
Total Protein: 7.2 g/dL (ref 6.5–8.1)

## 2018-05-04 LAB — GASTROINTESTINAL PANEL BY PCR, STOOL (REPLACES STOOL CULTURE)

## 2018-05-04 LAB — C DIFFICILE QUICK SCREEN W PCR REFLEX
C DIFFICILE (CDIFF) TOXIN: NEGATIVE
C Diff antigen: NEGATIVE
C Diff interpretation: NOT DETECTED

## 2018-05-04 LAB — MRSA PCR SCREENING: MRSA BY PCR: NEGATIVE

## 2018-05-04 LAB — LACTIC ACID, PLASMA: Lactic Acid, Venous: 1.7 mmol/L (ref 0.5–1.9)

## 2018-05-04 LAB — LIPASE, BLOOD: Lipase: 37 U/L (ref 11–51)

## 2018-05-04 LAB — POC OCCULT BLOOD, ED: Fecal Occult Bld: POSITIVE — AB

## 2018-05-04 LAB — MAGNESIUM: Magnesium: 2 mg/dL (ref 1.7–2.4)

## 2018-05-04 MED ORDER — COLLAGENASE 250 UNIT/GM EX OINT
TOPICAL_OINTMENT | Freq: Every day | CUTANEOUS | Status: DC
  Administered 2018-05-04 – 2018-05-05 (×2): via TOPICAL
  Filled 2018-05-04 (×2): qty 30

## 2018-05-04 MED ORDER — ONDANSETRON HCL 4 MG/2ML IJ SOLN: 4.0000 mg | Freq: Four times a day (QID) | INTRAMUSCULAR | Status: DC | PRN

## 2018-05-04 MED ORDER — ALPRAZOLAM 0.25 MG PO TABS
0.2500 mg | ORAL_TABLET | Freq: Three times a day (TID) | ORAL | Status: DC
  Administered 2018-05-04 – 2018-05-06 (×6): 0.25 mg via ORAL
  Filled 2018-05-04 (×6): qty 1

## 2018-05-04 MED ORDER — ONDANSETRON HCL 4 MG PO TABS: 4.0000 mg | ORAL_TABLET | Freq: Four times a day (QID) | ORAL | Status: DC | PRN

## 2018-05-04 MED ORDER — CITALOPRAM HYDROBROMIDE 20 MG PO TABS
40.0000 mg | ORAL_TABLET | Freq: Every day | ORAL | Status: DC
  Administered 2018-05-04 – 2018-05-06 (×3): 40 mg via ORAL
  Filled 2018-05-04 (×3): qty 2

## 2018-05-04 MED ORDER — POTASSIUM CHLORIDE IN NACL 20-0.9 MEQ/L-% IV SOLN
INTRAVENOUS | Status: DC
  Administered 2018-05-04: 04:00:00 via INTRAVENOUS
  Filled 2018-05-04: qty 1000

## 2018-05-04 MED ORDER — GABAPENTIN 300 MG PO CAPS
600.0000 mg | ORAL_CAPSULE | Freq: Three times a day (TID) | ORAL | Status: DC
  Administered 2018-05-04 – 2018-05-06 (×6): 600 mg via ORAL
  Filled 2018-05-04 (×6): qty 2

## 2018-05-04 MED ORDER — OXYCODONE HCL 5 MG PO TABS
10.0000 mg | ORAL_TABLET | Freq: Two times a day (BID) | ORAL | Status: DC | PRN
  Administered 2018-05-04 – 2018-05-06 (×4): 10 mg via ORAL
  Filled 2018-05-04 (×4): qty 2

## 2018-05-04 MED ORDER — METHOCARBAMOL 500 MG PO TABS
1000.0000 mg | ORAL_TABLET | Freq: Four times a day (QID) | ORAL | Status: DC | PRN
  Administered 2018-05-05: 1000 mg via ORAL
  Filled 2018-05-04: qty 2

## 2018-05-04 MED ORDER — SODIUM CHLORIDE 0.9 % IV SOLN
1.0000 g | Freq: Once | INTRAVENOUS | Status: AC
  Administered 2018-05-04: 1 g via INTRAVENOUS
  Filled 2018-05-04: qty 10

## 2018-05-04 MED ORDER — ACETAMINOPHEN 650 MG RE SUPP: 650.0000 mg | Freq: Four times a day (QID) | RECTAL | Status: DC | PRN

## 2018-05-04 MED ORDER — SODIUM CHLORIDE 0.9 % IV SOLN
1.0000 g | INTRAVENOUS | Status: DC
  Administered 2018-05-05 – 2018-05-06 (×2): 1 g via INTRAVENOUS
  Filled 2018-05-04 (×2): qty 10

## 2018-05-04 MED ORDER — PANTOPRAZOLE SODIUM 40 MG PO TBEC
40.0000 mg | DELAYED_RELEASE_TABLET | Freq: Every day | ORAL | Status: DC
  Administered 2018-05-04 – 2018-05-06 (×3): 40 mg via ORAL
  Filled 2018-05-04 (×3): qty 1

## 2018-05-04 MED ORDER — LEVOTHYROXINE SODIUM 88 MCG PO TABS
88.0000 ug | ORAL_TABLET | Freq: Every day | ORAL | Status: DC
  Administered 2018-05-04 – 2018-05-06 (×3): 88 ug via ORAL
  Filled 2018-05-04 (×5): qty 1

## 2018-05-04 MED ORDER — POTASSIUM CHLORIDE CRYS ER 20 MEQ PO TBCR
20.0000 meq | EXTENDED_RELEASE_TABLET | Freq: Two times a day (BID) | ORAL | Status: AC
  Administered 2018-05-04 (×2): 20 meq via ORAL
  Filled 2018-05-04 (×2): qty 1

## 2018-05-04 MED ORDER — POTASSIUM CHLORIDE IN NACL 40-0.9 MEQ/L-% IV SOLN
INTRAVENOUS | Status: DC
  Administered 2018-05-04 – 2018-05-06 (×4): 100 mL/h via INTRAVENOUS

## 2018-05-04 MED ORDER — IOHEXOL 300 MG/ML  SOLN
100.0000 mL | Freq: Once | INTRAMUSCULAR | Status: AC | PRN
  Administered 2018-05-04: 100 mL via INTRAVENOUS

## 2018-05-04 MED ORDER — MORPHINE SULFATE ER 30 MG PO TBCR
30.0000 mg | EXTENDED_RELEASE_TABLET | Freq: Three times a day (TID) | ORAL | Status: DC
  Administered 2018-05-04 – 2018-05-06 (×8): 30 mg via ORAL
  Filled 2018-05-04 (×8): qty 1

## 2018-05-04 MED ORDER — HYDROMORPHONE HCL 1 MG/ML IJ SOLN
1.0000 mg | Freq: Once | INTRAMUSCULAR | Status: AC
  Administered 2018-05-04: 1 mg via INTRAVENOUS
  Filled 2018-05-04: qty 1

## 2018-05-04 MED ORDER — ACETAMINOPHEN 325 MG PO TABS
650.0000 mg | ORAL_TABLET | Freq: Four times a day (QID) | ORAL | Status: DC | PRN
  Administered 2018-05-05: 650 mg via ORAL
  Filled 2018-05-04: qty 2

## 2018-05-04 MED ORDER — FLUCONAZOLE 100 MG PO TABS
200.0000 mg | ORAL_TABLET | Freq: Once | ORAL | Status: AC
  Administered 2018-05-04: 200 mg via ORAL
  Filled 2018-05-04: qty 2
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
## 2018-05-04 NOTE — Consult Note (Signed)
[place] Nurse wound consult note Reason for Consult: pressure injury Wound type: Stage 4 Pressure injury, present since 2018. Described by this clinician in 2018 with similar characteristics as image reviewed today.  Severe epibole of the wound edges with maceration. Noted to have fungal overgrowth as well.  Pressure Injury POA: Yes Measurement: see nursing flow sheet Wound bed: did not assess, reviewed of record and images with chronic non healing PI, chronic osteomyelitis. Would not expect this wound to heal at this time.  Periwound: intense redness with satellite lesions Dressing procedure/placement/frequency:  Continue POC per outpatient orders per SNF MD. Annitta Needs to gauze packed into wound. Would limit any moisture on the periwound areas and buttocks. Will add antifungal cream. Patient can turn herself side to side per staff, will not add low air loss mattress for that reason.  Discussed POC with bedside nurse.  Re consult if needed, will not follow at this time. Thanks  Zaidin Blyden [employer], RN,CWOCN, CNS, [place] [phone])

## 2018-05-04 NOTE — Progress Notes (Signed)
Just Received a call from patients PCP Camila Li, she will be sending over the medication that patient has in hopes it will be helpful for the patient.

## 2018-05-04 NOTE — ED Notes (Signed)
Pt presents to ED with stage IV, tunneling decubitus pressure ulcer over sacrum. Wound was uncovered at time of arrival and appears to have urine and feces present in the wound. Pt also presents massive stage II pressure ulcer covering entire buttocks, mid-thighs and groin. Pt was given peri-care, given bed bath, linen changed and barrier ointment applied.

## 2018-05-05 ENCOUNTER — Encounter: Payer: Medicaid Other | Admitting: Family Medicine

## 2018-05-05 DIAGNOSIS — Z6834 Body mass index (BMI) 34.0-34.9, adult: Secondary | ICD-10-CM | POA: Diagnosis not present

## 2018-05-05 DIAGNOSIS — B359 Dermatophytosis, unspecified: Secondary | ICD-10-CM | POA: Diagnosis present

## 2018-05-05 DIAGNOSIS — Z981 Arthrodesis status: Secondary | ICD-10-CM | POA: Diagnosis not present

## 2018-05-05 DIAGNOSIS — Z9049 Acquired absence of other specified parts of digestive tract: Secondary | ICD-10-CM | POA: Diagnosis not present

## 2018-05-05 DIAGNOSIS — Z79891 Long term (current) use of opiate analgesic: Secondary | ICD-10-CM | POA: Diagnosis not present

## 2018-05-05 DIAGNOSIS — R197 Diarrhea, unspecified: Secondary | ICD-10-CM | POA: Diagnosis not present

## 2018-05-05 DIAGNOSIS — E876 Hypokalemia: Secondary | ICD-10-CM | POA: Diagnosis present

## 2018-05-05 DIAGNOSIS — K529 Noninfective gastroenteritis and colitis, unspecified: Secondary | ICD-10-CM | POA: Diagnosis not present

## 2018-05-05 DIAGNOSIS — E039 Hypothyroidism, unspecified: Secondary | ICD-10-CM | POA: Diagnosis present

## 2018-05-05 DIAGNOSIS — F418 Other specified anxiety disorders: Secondary | ICD-10-CM | POA: Diagnosis present

## 2018-05-05 DIAGNOSIS — Z8249 Family history of ischemic heart disease and other diseases of the circulatory system: Secondary | ICD-10-CM | POA: Diagnosis not present

## 2018-05-05 DIAGNOSIS — G8220 Paraplegia, unspecified: Secondary | ICD-10-CM | POA: Diagnosis present

## 2018-05-05 DIAGNOSIS — Z79899 Other long term (current) drug therapy: Secondary | ICD-10-CM | POA: Diagnosis not present

## 2018-05-05 DIAGNOSIS — K219 Gastro-esophageal reflux disease without esophagitis: Secondary | ICD-10-CM | POA: Diagnosis present

## 2018-05-05 DIAGNOSIS — Y846 Urinary catheterization as the cause of abnormal reaction of the patient, or of later complication, without mention of misadventure at the time of the procedure: Secondary | ICD-10-CM | POA: Diagnosis present

## 2018-05-05 DIAGNOSIS — M8668 Other chronic osteomyelitis, other site: Secondary | ICD-10-CM | POA: Diagnosis present

## 2018-05-05 DIAGNOSIS — R1110 Vomiting, unspecified: Secondary | ICD-10-CM | POA: Diagnosis present

## 2018-05-05 DIAGNOSIS — G894 Chronic pain syndrome: Secondary | ICD-10-CM | POA: Diagnosis not present

## 2018-05-05 DIAGNOSIS — F17210 Nicotine dependence, cigarettes, uncomplicated: Secondary | ICD-10-CM | POA: Diagnosis present

## 2018-05-05 DIAGNOSIS — T83518A Infection and inflammatory reaction due to other urinary catheter, initial encounter: Secondary | ICD-10-CM | POA: Diagnosis present

## 2018-05-05 DIAGNOSIS — N3091 Cystitis, unspecified with hematuria: Secondary | ICD-10-CM | POA: Diagnosis present

## 2018-05-05 DIAGNOSIS — L89154 Pressure ulcer of sacral region, stage 4: Secondary | ICD-10-CM | POA: Diagnosis present

## 2018-05-05 DIAGNOSIS — N390 Urinary tract infection, site not specified: Secondary | ICD-10-CM | POA: Diagnosis not present

## 2018-05-05 DIAGNOSIS — Z79890 Hormone replacement therapy (postmenopausal): Secondary | ICD-10-CM | POA: Diagnosis not present

## 2018-05-05 LAB — CBC WITH DIFFERENTIAL/PLATELET
Abs Immature Granulocytes: 0.03 10*3/uL (ref 0.00–0.07)
Basophils Absolute: 0.1 10*3/uL (ref 0.0–0.1)
Basophils Relative: 1 %
Eosinophils Absolute: 0.7 10*3/uL — ABNORMAL HIGH (ref 0.0–0.5)
Eosinophils Relative: 6 %
HCT: 37.2 % (ref 36.0–46.0)
Hemoglobin: 11.2 g/dL — ABNORMAL LOW (ref 12.0–15.0)
Immature Granulocytes: 0 %
LYMPHS PCT: 38 %
Lymphs Abs: 4.7 10*3/uL — ABNORMAL HIGH (ref 0.7–4.0)
MCH: 26.9 pg (ref 26.0–34.0)
MCHC: 30.1 g/dL (ref 30.0–36.0)
MCV: 89.4 fL (ref 80.0–100.0)
Monocytes Absolute: 0.8 10*3/uL (ref 0.1–1.0)
Monocytes Relative: 7 %
Neutro Abs: 6 10*3/uL (ref 1.7–7.7)
Neutrophils Relative %: 48 %
Platelets: 569 10*3/uL — ABNORMAL HIGH (ref 150–400)
RBC: 4.16 MIL/uL (ref 3.87–5.11)
RDW: 15.9 % — ABNORMAL HIGH (ref 11.5–15.5)
WBC: 12.3 10*3/uL — ABNORMAL HIGH (ref 4.0–10.5)
nRBC: 0 % (ref 0.0–0.2)

## 2018-05-05 LAB — URINE CULTURE

## 2018-05-05 LAB — BASIC METABOLIC PANEL
Anion gap: 5 (ref 5–15)
CO2: 23 mmol/L (ref 22–32)
Calcium: 8.3 mg/dL — ABNORMAL LOW (ref 8.9–10.3)
Chloride: 112 mmol/L — ABNORMAL HIGH (ref 98–111)
Glucose, Bld: 78 mg/dL (ref 70–99)
Potassium: 3.9 mmol/L (ref 3.5–5.1)
SODIUM: 140 mmol/L (ref 135–145)

## 2018-05-05 LAB — HIV ANTIBODY (ROUTINE TESTING W REFLEX): HIV Screen 4th Generation wRfx: NONREACTIVE

## 2018-05-05 MED ORDER — DIPHENOXYLATE-ATROPINE 2.5-0.025 MG PO TABS: 1.0000 | ORAL_TABLET | Freq: Two times a day (BID) | ORAL | Status: DC | PRN

## 2018-05-05 MED ORDER — SACCHAROMYCES BOULARDII 250 MG PO CAPS
250.0000 mg | ORAL_CAPSULE | Freq: Two times a day (BID) | ORAL | Status: DC
  Administered 2018-05-05 – 2018-05-06 (×3): 250 mg via ORAL
  Filled 2018-05-05 (×3): qty 1

## 2018-05-05 NOTE — Care Management (Signed)
Pt active with [facility]. Discussed with rep, Romualdo Bolk.
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
## 2018-05-06 ENCOUNTER — Encounter (HOSPITAL_BASED_OUTPATIENT_CLINIC_OR_DEPARTMENT_OTHER): Payer: Medicaid Other | Attending: Internal Medicine

## 2018-05-06 DIAGNOSIS — G8220 Paraplegia, unspecified: Secondary | ICD-10-CM | POA: Insufficient documentation

## 2018-05-06 DIAGNOSIS — L89154 Pressure ulcer of sacral region, stage 4: Secondary | ICD-10-CM | POA: Insufficient documentation

## 2018-05-06 LAB — BASIC METABOLIC PANEL
Anion gap: 6 (ref 5–15)
BUN: <5 mg/dL — ABNORMAL LOW (ref 6–20)
CO2: 26 mmol/L (ref 22–32)
Calcium: 8.3 mg/dL — ABNORMAL LOW (ref 8.9–10.3)
Chloride: 108 mmol/L (ref 98–111)
Creatinine, Ser: 0.46 mg/dL (ref 0.44–1.00)
GFR calc Af Amer: >60 mL/min (ref >60)
GFR calc non Af Amer: >60 mL/min (ref >60)
Glucose, Bld: 81 mg/dL (ref 70–99)
POTASSIUM: 3.8 mmol/L (ref 3.5–5.1)
Sodium: 140 mmol/L (ref 135–145)

## 2018-05-06 LAB — CBC
HCT: 40.2 % (ref 36.0–46.0)
Hemoglobin: 11.8 g/dL — ABNORMAL LOW (ref 12.0–15.0)
MCH: 26.4 pg (ref 26.0–34.0)
MCHC: 29.4 g/dL — ABNORMAL LOW (ref 30.0–36.0)
MCV: 89.9 fL (ref 80.0–100.0)
Platelets: 595 10*3/uL — ABNORMAL HIGH (ref 150–400)
RBC: 4.47 MIL/uL (ref 3.87–5.11)
RDW: 15.8 % — ABNORMAL HIGH (ref 11.5–15.5)
WBC: 9.6 10*3/uL (ref 4.0–10.5)
nRBC: 0 % (ref 0.0–0.2)

## 2018-05-06 LAB — MAGNESIUM: Magnesium: 1.8 mg/dL (ref 1.7–2.4)

## 2018-05-06 MED ORDER — SACCHAROMYCES BOULARDII 250 MG PO CAPS: 250.0000 mg | ORAL_CAPSULE | Freq: Two times a day (BID) | ORAL | 0 refills | Status: AC

## 2018-05-06 MED ORDER — CEFUROXIME AXETIL 500 MG PO TABS: 500.0000 mg | ORAL_TABLET | Freq: Two times a day (BID) | ORAL | 0 refills | Status: AC

## 2018-05-06 MED ORDER — DIPHENOXYLATE-ATROPINE 2.5-0.025 MG PO TABS: 1.0000 | ORAL_TABLET | Freq: Two times a day (BID) | ORAL | 0 refills | Status: DC | PRN

## 2018-05-06 MED ORDER — SENNOSIDES-DOCUSATE SODIUM 8.6-50 MG PO TABS: 1.0000 | ORAL_TABLET | Freq: Every day | ORAL | Status: DC | PRN

## 2018-05-06 NOTE — Care Management Note (Signed)
Case Management Note  Patient Details  Name: [name] MRN: [id number] Date of Birth: 27-Jan-1981  Subjective/Objective:           Admitted with AGE. Pt from home, lives parents. Has insurance and PCP. Active with [facility] pta, plans to resume services. Aware HH has 48 hrs to make first visit.        Action/Plan: DC home with HH. Vaughan Basta, [facility] rep, aware of DC.  Expected Discharge Date:  05/06/18               Expected Discharge Plan:  [place]  In-House Referral:  NA  Discharge planning Services  CM Consult  Post Acute Care Choice:  Home Health, Resumption of Svcs/PTA Provider Choice offered to:  Patient  HH Arranged:  RN, PT [facility] Agency:  [place] ([facility]  Status of Service:  Completed, signed off  If discussed at [place] of Stay Meetings, dates discussed:    Additional Comments:  Sherald Barge, RN 05/06/2018, 12:59 PM
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
## 2018-05-12 ENCOUNTER — Ambulatory Visit (HOSPITAL_COMMUNITY): Payer: Medicaid Other

## 2018-05-12 ENCOUNTER — Encounter (HOSPITAL_COMMUNITY): Payer: Self-pay

## 2018-05-12 ENCOUNTER — Telehealth (HOSPITAL_COMMUNITY): Payer: Self-pay | Admitting: *Deleted

## 2018-05-12 NOTE — Telephone Encounter (Signed)
05/12/18  we cx because patient is already receiving home health care

## 2018-05-17 ENCOUNTER — Other Ambulatory Visit: Payer: Self-pay

## 2018-05-17 ENCOUNTER — Encounter: Payer: Self-pay | Admitting: Family Medicine

## 2018-05-17 ENCOUNTER — Ambulatory Visit (INDEPENDENT_AMBULATORY_CARE_PROVIDER_SITE_OTHER): Payer: Medicaid Other | Admitting: Family Medicine

## 2018-05-17 ENCOUNTER — Other Ambulatory Visit (HOSPITAL_COMMUNITY)
Admission: RE | Admit: 2018-05-17 | Discharge: 2018-05-17 | Disposition: A | Discharge status: Home / Self Care | Payer: Medicaid Other | Source: Ambulatory Visit | Attending: Family Medicine | Admitting: Family Medicine

## 2018-05-17 VITALS — BP 120/70 | HR 80 | Ht 66.0 in

## 2018-05-17 DIAGNOSIS — Z01419 Encounter for gynecological examination (general) (routine) without abnormal findings: Secondary | ICD-10-CM

## 2018-05-17 DIAGNOSIS — N898 Other specified noninflammatory disorders of vagina: Secondary | ICD-10-CM

## 2018-05-17 DIAGNOSIS — B373 Candidiasis of vulva and vagina: Secondary | ICD-10-CM

## 2018-05-17 DIAGNOSIS — B3731 Acute candidiasis of vulva and vagina: Secondary | ICD-10-CM

## 2018-05-17 DIAGNOSIS — Z0000 Encounter for general adult medical examination without abnormal findings: Secondary | ICD-10-CM

## 2018-05-17 MED ORDER — FLUCONAZOLE 150 MG PO TABS: 150.0000 mg | ORAL_TABLET | Freq: Every day | ORAL | 0 refills | Status: DC

## 2018-05-17 NOTE — Progress Notes (Signed)
error 
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
## 2018-05-18 LAB — CERVICOVAGINAL ANCILLARY ONLY
Bacterial vaginitis: NEGATIVE
CANDIDA VAGINITIS: POSITIVE — AB

## 2018-05-19 LAB — CYTOLOGY - PAP
Chlamydia: NEGATIVE
Diagnosis: NEGATIVE
HPV: NOT DETECTED
Neisseria Gonorrhea: NEGATIVE
Trichomonas: NEGATIVE

## 2018-05-28 DIAGNOSIS — G8220 Paraplegia, unspecified: Secondary | ICD-10-CM | POA: Diagnosis not present

## 2018-05-28 DIAGNOSIS — L89154 Pressure ulcer of sacral region, stage 4: Secondary | ICD-10-CM | POA: Diagnosis not present

## 2018-06-11 ENCOUNTER — Encounter (HOSPITAL_BASED_OUTPATIENT_CLINIC_OR_DEPARTMENT_OTHER): Payer: Medicaid Other | Attending: Internal Medicine

## 2018-06-11 ENCOUNTER — Other Ambulatory Visit: Payer: Self-pay

## 2018-06-11 DIAGNOSIS — L89154 Pressure ulcer of sacral region, stage 4: Secondary | ICD-10-CM | POA: Diagnosis not present

## 2018-06-25 DIAGNOSIS — L89154 Pressure ulcer of sacral region, stage 4: Secondary | ICD-10-CM | POA: Diagnosis not present

## 2018-06-27 ENCOUNTER — Emergency Department (HOSPITAL_COMMUNITY)
Admission: EM | Admit: 2018-06-27 | Discharge: 2018-06-28 | Disposition: A | Discharge status: Home / Self Care | Payer: Medicaid Other | Attending: Emergency Medicine | Admitting: Emergency Medicine

## 2018-06-27 ENCOUNTER — Encounter (HOSPITAL_COMMUNITY): Payer: Self-pay

## 2018-06-27 ENCOUNTER — Emergency Department (HOSPITAL_COMMUNITY): Payer: Medicaid Other

## 2018-06-27 ENCOUNTER — Other Ambulatory Visit: Payer: Self-pay

## 2018-06-27 DIAGNOSIS — F99 Mental disorder, not otherwise specified: Secondary | ICD-10-CM | POA: Diagnosis not present

## 2018-06-27 DIAGNOSIS — R197 Diarrhea, unspecified: Secondary | ICD-10-CM | POA: Diagnosis not present

## 2018-06-27 DIAGNOSIS — F17210 Nicotine dependence, cigarettes, uncomplicated: Secondary | ICD-10-CM | POA: Insufficient documentation

## 2018-06-27 DIAGNOSIS — Z79899 Other long term (current) drug therapy: Secondary | ICD-10-CM | POA: Diagnosis not present

## 2018-06-27 DIAGNOSIS — E876 Hypokalemia: Secondary | ICD-10-CM | POA: Diagnosis not present

## 2018-06-27 DIAGNOSIS — E039 Hypothyroidism, unspecified: Secondary | ICD-10-CM | POA: Insufficient documentation

## 2018-06-27 DIAGNOSIS — R109 Unspecified abdominal pain: Secondary | ICD-10-CM | POA: Diagnosis present

## 2018-06-27 LAB — CBC WITH DIFFERENTIAL/PLATELET
Abs Immature Granulocytes: 0.07 10*3/uL (ref 0.00–0.07)
Basophils Absolute: 0 10*3/uL (ref 0.0–0.1)
Basophils Relative: 0 %
Eosinophils Absolute: 0 10*3/uL (ref 0.0–0.5)
Eosinophils Relative: 0 %
HCT: 37.2 % (ref 36.0–46.0)
Hemoglobin: 11.8 g/dL — ABNORMAL LOW (ref 12.0–15.0)
Immature Granulocytes: 0 %
Lymphocytes Relative: 16 %
Lymphs Abs: 2.8 10*3/uL (ref 0.7–4.0)
MCH: 28 pg (ref 26.0–34.0)
MCHC: 31.7 g/dL (ref 30.0–36.0)
MCV: 88.4 fL (ref 80.0–100.0)
Monocytes Absolute: 1 10*3/uL (ref 0.1–1.0)
Monocytes Relative: 6 %
Neutro Abs: 13.6 10*3/uL — ABNORMAL HIGH (ref 1.7–7.7)
Neutrophils Relative %: 78 %
Platelets: 524 10*3/uL — ABNORMAL HIGH (ref 150–400)
RBC: 4.21 MIL/uL (ref 3.87–5.11)
RDW: 16.2 % — ABNORMAL HIGH (ref 11.5–15.5)
WBC: 17.6 10*3/uL — ABNORMAL HIGH (ref 4.0–10.5)
nRBC: 0 % (ref 0.0–0.2)

## 2018-06-27 LAB — COMPREHENSIVE METABOLIC PANEL
ALT: 9 U/L (ref 0–44)
AST: 10 U/L — ABNORMAL LOW (ref 15–41)
Albumin: 3.6 g/dL (ref 3.5–5.0)
Alkaline Phosphatase: 77 U/L (ref 38–126)
Anion gap: 8 (ref 5–15)
BUN: <5 mg/dL — ABNORMAL LOW (ref 6–20)
CO2: 25 mmol/L (ref 22–32)
Calcium: 8.9 mg/dL (ref 8.9–10.3)
Chloride: 107 mmol/L (ref 98–111)
Creatinine, Ser: 0.37 mg/dL — ABNORMAL LOW (ref 0.44–1.00)
GFR calc Af Amer: >60 mL/min (ref >60)
GFR calc non Af Amer: >60 mL/min (ref >60)
Glucose, Bld: 104 mg/dL — ABNORMAL HIGH (ref 70–99)
Potassium: 3.1 mmol/L — ABNORMAL LOW (ref 3.5–5.1)
Sodium: 140 mmol/L (ref 135–145)
Total Bilirubin: 0.4 mg/dL (ref 0.3–1.2)
Total Protein: 7.5 g/dL (ref 6.5–8.1)

## 2018-06-27 LAB — LIPASE, BLOOD: Lipase: 34 U/L (ref 11–51)

## 2018-06-27 LAB — URINALYSIS, ROUTINE W REFLEX MICROSCOPIC
Bacteria, UA: NONE SEEN
Bilirubin Urine: NEGATIVE
Glucose, UA: NEGATIVE mg/dL
Hgb urine dipstick: NEGATIVE
Ketones, ur: NEGATIVE mg/dL
Nitrite: NEGATIVE
Protein, ur: 30 mg/dL — AB
Specific Gravity, Urine: >1.046 — ABNORMAL HIGH (ref 1.005–1.030)
pH: 7 (ref 5.0–8.0)

## 2018-06-27 MED ORDER — SODIUM CHLORIDE 0.9 % IV BOLUS
1000.0000 mL | Freq: Once | INTRAVENOUS | Status: AC
  Administered 2018-06-27: 1000 mL via INTRAVENOUS

## 2018-06-27 MED ORDER — LOPERAMIDE HCL 2 MG PO TABS: 2.0000 mg | ORAL_TABLET | Freq: Four times a day (QID) | ORAL | 0 refills | Status: DC | PRN

## 2018-06-27 MED ORDER — ONDANSETRON HCL 4 MG/2ML IJ SOLN
4.0000 mg | Freq: Once | INTRAMUSCULAR | Status: AC
  Administered 2018-06-27: 4 mg via INTRAVENOUS
  Filled 2018-06-27: qty 2

## 2018-06-27 MED ORDER — IOHEXOL 300 MG/ML  SOLN
100.0000 mL | Freq: Once | INTRAMUSCULAR | Status: AC | PRN
  Administered 2018-06-27: 21:00:00 100 mL via INTRAVENOUS

## 2018-06-27 MED ORDER — HYDROMORPHONE HCL 1 MG/ML IJ SOLN
0.5000 mg | Freq: Once | INTRAMUSCULAR | Status: AC
  Administered 2018-06-27: 0.5 mg via INTRAVENOUS
  Filled 2018-06-27: qty 1

## 2018-06-27 MED ORDER — SODIUM CHLORIDE 0.9 % IV SOLN
INTRAVENOUS | Status: DC
  Administered 2018-06-27: 20:00:00 via INTRAVENOUS

## 2018-06-27 NOTE — Discharge Instructions (Addendum)
Work-up for the diarrhea here today without any acute findings.  CT scan as far as the inside of the abdomen without any abnormalities.  Still have the chronic osteo-in the sacral buttocks area.  No real significant change there.  White blood cell count was elevated urinalysis negative for urinary tract infection but was sent for urine culture you will be contacted if it grows a significant amount of bacteria but we would not expect that.  Take the Imodium as needed for the diarrhea.  Make an appointment to follow-up with your doctor.

## 2018-06-27 NOTE — ED Triage Notes (Signed)
EMS reports pt has had diarrhea all day and vomiting this morning.  Reports was admitted approx 1 month ago for same.  Says had UTI and a bowel infection.  Pt c/o pain in lower back.

## 2018-06-27 NOTE — ED Notes (Signed)
Patient transported to CT 

## 2018-06-27 NOTE — ED Notes (Signed)
Pt given crackers and ginger ale at pt request.
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
## 2018-06-27 NOTE — ED Notes (Signed)
Pt waiting for EMS to transport her home.

## 2018-06-28 ENCOUNTER — Emergency Department (HOSPITAL_COMMUNITY)
Admission: EM | Admit: 2018-06-28 | Discharge: 2018-06-28 | Disposition: A | Discharge status: Home / Self Care | Payer: Medicaid Other | Source: Home / Self Care | Attending: Emergency Medicine | Admitting: Emergency Medicine

## 2018-06-28 ENCOUNTER — Encounter (HOSPITAL_COMMUNITY): Payer: Self-pay | Admitting: Emergency Medicine

## 2018-06-28 ENCOUNTER — Other Ambulatory Visit: Payer: Self-pay

## 2018-06-28 DIAGNOSIS — F99 Mental disorder, not otherwise specified: Secondary | ICD-10-CM | POA: Insufficient documentation

## 2018-06-28 DIAGNOSIS — E039 Hypothyroidism, unspecified: Secondary | ICD-10-CM

## 2018-06-28 DIAGNOSIS — R197 Diarrhea, unspecified: Secondary | ICD-10-CM | POA: Insufficient documentation

## 2018-06-28 DIAGNOSIS — F17210 Nicotine dependence, cigarettes, uncomplicated: Secondary | ICD-10-CM

## 2018-06-28 DIAGNOSIS — E876 Hypokalemia: Secondary | ICD-10-CM

## 2018-06-28 DIAGNOSIS — Z79899 Other long term (current) drug therapy: Secondary | ICD-10-CM | POA: Insufficient documentation

## 2018-06-28 LAB — BASIC METABOLIC PANEL
Anion gap: 7 (ref 5–15)
BUN: 6 mg/dL (ref 6–20)
CO2: 23 mmol/L (ref 22–32)
Calcium: 8.5 mg/dL — ABNORMAL LOW (ref 8.9–10.3)
Chloride: 110 mmol/L (ref 98–111)
Creatinine, Ser: 0.37 mg/dL — ABNORMAL LOW (ref 0.44–1.00)
GFR calc Af Amer: >60 mL/min (ref >60)
GFR calc non Af Amer: >60 mL/min (ref >60)
Glucose, Bld: 97 mg/dL (ref 70–99)
Potassium: 3.2 mmol/L — ABNORMAL LOW (ref 3.5–5.1)
Sodium: 140 mmol/L (ref 135–145)

## 2018-06-28 LAB — CBC
HCT: 36.2 % (ref 36.0–46.0)
Hemoglobin: 11.5 g/dL — ABNORMAL LOW (ref 12.0–15.0)
MCH: 28 pg (ref 26.0–34.0)
MCHC: 31.8 g/dL (ref 30.0–36.0)
MCV: 88.1 fL (ref 80.0–100.0)
Platelets: 491 10*3/uL — ABNORMAL HIGH (ref 150–400)
RBC: 4.11 MIL/uL (ref 3.87–5.11)
RDW: 16.3 % — ABNORMAL HIGH (ref 11.5–15.5)
WBC: 13.2 10*3/uL — ABNORMAL HIGH (ref 4.0–10.5)
nRBC: 0 % (ref 0.0–0.2)

## 2018-06-28 LAB — POC OCCULT BLOOD, ED: Fecal Occult Bld: POSITIVE — AB
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
## 2018-06-28 MED ORDER — FENTANYL CITRATE (PF) 100 MCG/2ML IJ SOLN
100.0000 ug | Freq: Once | INTRAMUSCULAR | Status: AC
  Administered 2018-06-28: 100 ug via INTRAVENOUS
  Filled 2018-06-28: qty 2

## 2018-06-28 MED ORDER — HYDROMORPHONE HCL 1 MG/ML IJ SOLN
1.0000 mg | Freq: Once | INTRAMUSCULAR | Status: AC
  Administered 2018-06-28: 17:00:00 1 mg via INTRAVENOUS
  Filled 2018-06-28: qty 1

## 2018-06-28 MED ORDER — POTASSIUM CHLORIDE CRYS ER 20 MEQ PO TBCR: 20.0000 meq | EXTENDED_RELEASE_TABLET | Freq: Every day | ORAL | 0 refills | Status: DC

## 2018-06-28 MED ORDER — LACTATED RINGERS IV BOLUS
1000.0000 mL | Freq: Once | INTRAVENOUS | Status: AC
  Administered 2018-06-28: 1000 mL via INTRAVENOUS

## 2018-06-28 MED ORDER — DIPHENOXYLATE-ATROPINE 2.5-0.025 MG PO TABS: 1.0000 | ORAL_TABLET | Freq: Four times a day (QID) | ORAL | 0 refills | Status: DC | PRN

## 2018-06-28 MED ORDER — POTASSIUM CHLORIDE CRYS ER 20 MEQ PO TBCR
40.0000 meq | EXTENDED_RELEASE_TABLET | Freq: Once | ORAL | Status: AC
  Administered 2018-06-28: 18:00:00 40 meq via ORAL
  Filled 2018-06-28: qty 2
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
## 2018-06-28 NOTE — ED Triage Notes (Signed)
Pt reports she was seen last night for diarrhea and it continues with some bright red blood in stool around lunch.  Last had immodium 3 hours ago.

## 2018-06-28 NOTE — Discharge Instructions (Signed)
If you develop worsening, continued, or recurrent abdominal pain, uncontrolled vomiting, fever, chest or back pain, or any other new/concerning symptoms then return to the ER for evaluation.  

## 2018-06-28 NOTE — ED Notes (Signed)
EMS notified to get patient

## 2018-06-28 NOTE — ED Notes (Addendum)
Have cleaned pt up. Not enough stool to get a stool sample.

## 2018-06-29 ENCOUNTER — Emergency Department (HOSPITAL_COMMUNITY): Payer: Medicaid Other

## 2018-06-29 ENCOUNTER — Other Ambulatory Visit: Payer: Self-pay

## 2018-06-29 ENCOUNTER — Encounter (HOSPITAL_COMMUNITY): Payer: Self-pay

## 2018-06-29 ENCOUNTER — Emergency Department (HOSPITAL_COMMUNITY)
Admission: EM | Admit: 2018-06-29 | Discharge: 2018-06-29 | Disposition: A | Discharge status: Home / Self Care | Payer: Medicaid Other | Attending: Emergency Medicine | Admitting: Emergency Medicine

## 2018-06-29 DIAGNOSIS — R112 Nausea with vomiting, unspecified: Secondary | ICD-10-CM | POA: Diagnosis present

## 2018-06-29 DIAGNOSIS — K529 Noninfective gastroenteritis and colitis, unspecified: Secondary | ICD-10-CM | POA: Insufficient documentation

## 2018-06-29 DIAGNOSIS — F17210 Nicotine dependence, cigarettes, uncomplicated: Secondary | ICD-10-CM | POA: Insufficient documentation

## 2018-06-29 DIAGNOSIS — Z79899 Other long term (current) drug therapy: Secondary | ICD-10-CM | POA: Insufficient documentation

## 2018-06-29 DIAGNOSIS — E876 Hypokalemia: Secondary | ICD-10-CM | POA: Diagnosis not present

## 2018-06-29 DIAGNOSIS — E039 Hypothyroidism, unspecified: Secondary | ICD-10-CM | POA: Insufficient documentation

## 2018-06-29 LAB — CBC WITH DIFFERENTIAL/PLATELET
Abs Immature Granulocytes: 0.04 10*3/uL (ref 0.00–0.07)
Basophils Absolute: 0 10*3/uL (ref 0.0–0.1)
Basophils Relative: 0 %
Eosinophils Absolute: 0.2 10*3/uL (ref 0.0–0.5)
Eosinophils Relative: 1 %
HCT: 36.5 % (ref 36.0–46.0)
Hemoglobin: 11.5 g/dL — ABNORMAL LOW (ref 12.0–15.0)
Immature Granulocytes: 0 %
Lymphocytes Relative: 23 %
Lymphs Abs: 2.9 10*3/uL (ref 0.7–4.0)
MCH: 27.8 pg (ref 26.0–34.0)
MCHC: 31.5 g/dL (ref 30.0–36.0)
MCV: 88.4 fL (ref 80.0–100.0)
Monocytes Absolute: 0.7 10*3/uL (ref 0.1–1.0)
Monocytes Relative: 6 %
Neutro Abs: 8.7 10*3/uL — ABNORMAL HIGH (ref 1.7–7.7)
Neutrophils Relative %: 70 %
Platelets: 479 10*3/uL — ABNORMAL HIGH (ref 150–400)
RBC: 4.13 MIL/uL (ref 3.87–5.11)
RDW: 16.1 % — ABNORMAL HIGH (ref 11.5–15.5)
WBC: 12.5 10*3/uL — ABNORMAL HIGH (ref 4.0–10.5)
nRBC: 0 % (ref 0.0–0.2)

## 2018-06-29 LAB — URINE CULTURE

## 2018-06-29 LAB — BASIC METABOLIC PANEL
Anion gap: 8 (ref 5–15)
BUN: <5 mg/dL — ABNORMAL LOW (ref 6–20)
CO2: 25 mmol/L (ref 22–32)
Calcium: 8.6 mg/dL — ABNORMAL LOW (ref 8.9–10.3)
Chloride: 109 mmol/L (ref 98–111)
Creatinine, Ser: 0.38 mg/dL — ABNORMAL LOW (ref 0.44–1.00)
GFR calc Af Amer: >60 mL/min (ref >60)
GFR calc non Af Amer: >60 mL/min (ref >60)
Glucose, Bld: 90 mg/dL (ref 70–99)
Potassium: 3.3 mmol/L — ABNORMAL LOW (ref 3.5–5.1)
Sodium: 142 mmol/L (ref 135–145)

## 2018-06-29 LAB — HEPATIC FUNCTION PANEL
ALT: 8 U/L (ref 0–44)
AST: 10 U/L — ABNORMAL LOW (ref 15–41)
Albumin: 3.1 g/dL — ABNORMAL LOW (ref 3.5–5.0)
Alkaline Phosphatase: 54 U/L (ref 38–126)
Bilirubin, Direct: 0.1 mg/dL (ref 0.0–0.2)
Indirect Bilirubin: 0.2 mg/dL — ABNORMAL LOW (ref 0.3–0.9)
Total Bilirubin: 0.3 mg/dL (ref 0.3–1.2)
Total Protein: 6.5 g/dL (ref 6.5–8.1)

## 2018-06-29 LAB — MAGNESIUM: Magnesium: 1.9 mg/dL (ref 1.7–2.4)

## 2018-06-29 LAB — LIPASE, BLOOD: Lipase: 24 U/L (ref 11–51)

## 2018-06-29 IMAGING — RF DG C-ARM 61-120 MIN
1 series · 3 of 3 positions shown · non-contrast
Comparison: 04/07/2016

CLINICAL DATA: Thoracolumbar fusion, history of L1 burst fracture

EXAM:
LUMBAR SPINE - 2-3 VIEW; DG C-ARM 61-120 MIN

[Series 1: run · 3 of 3 slices shown]
[im 1/3]
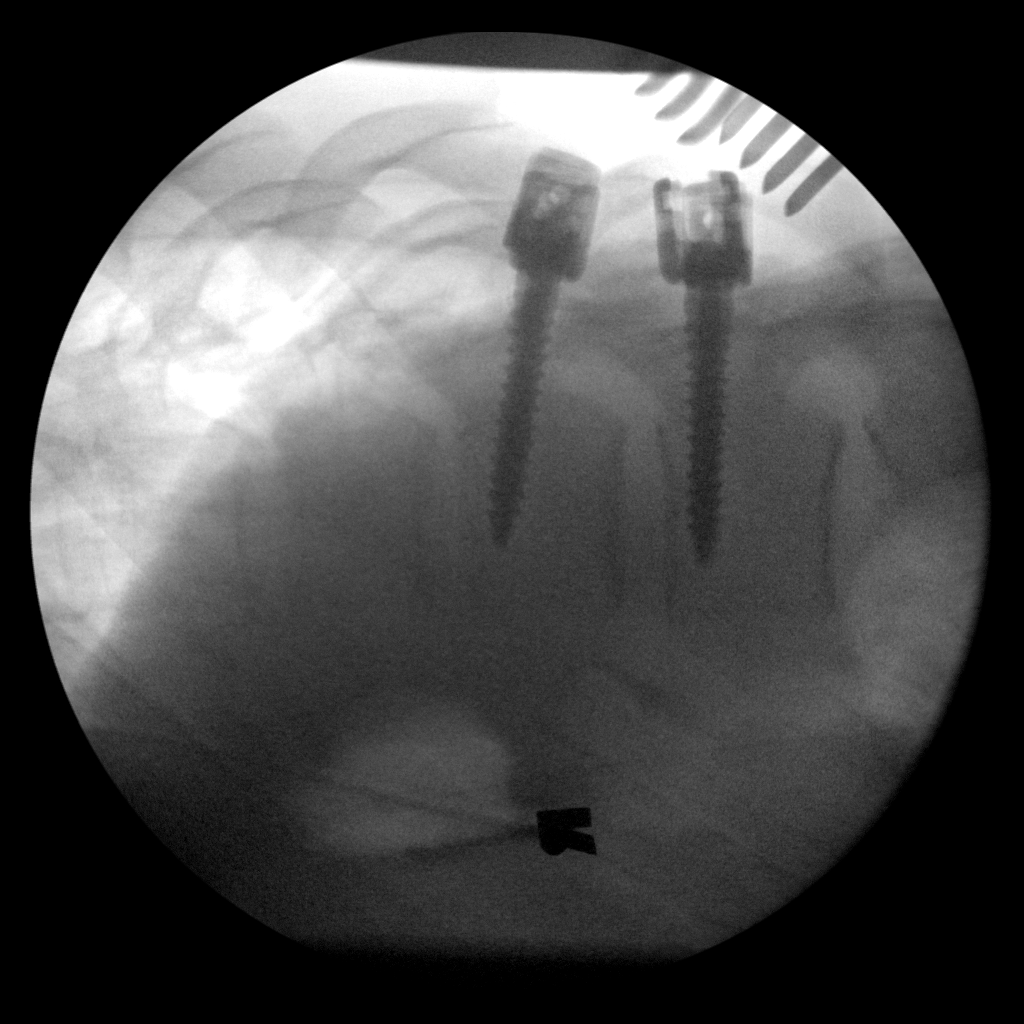
[im 2/3]
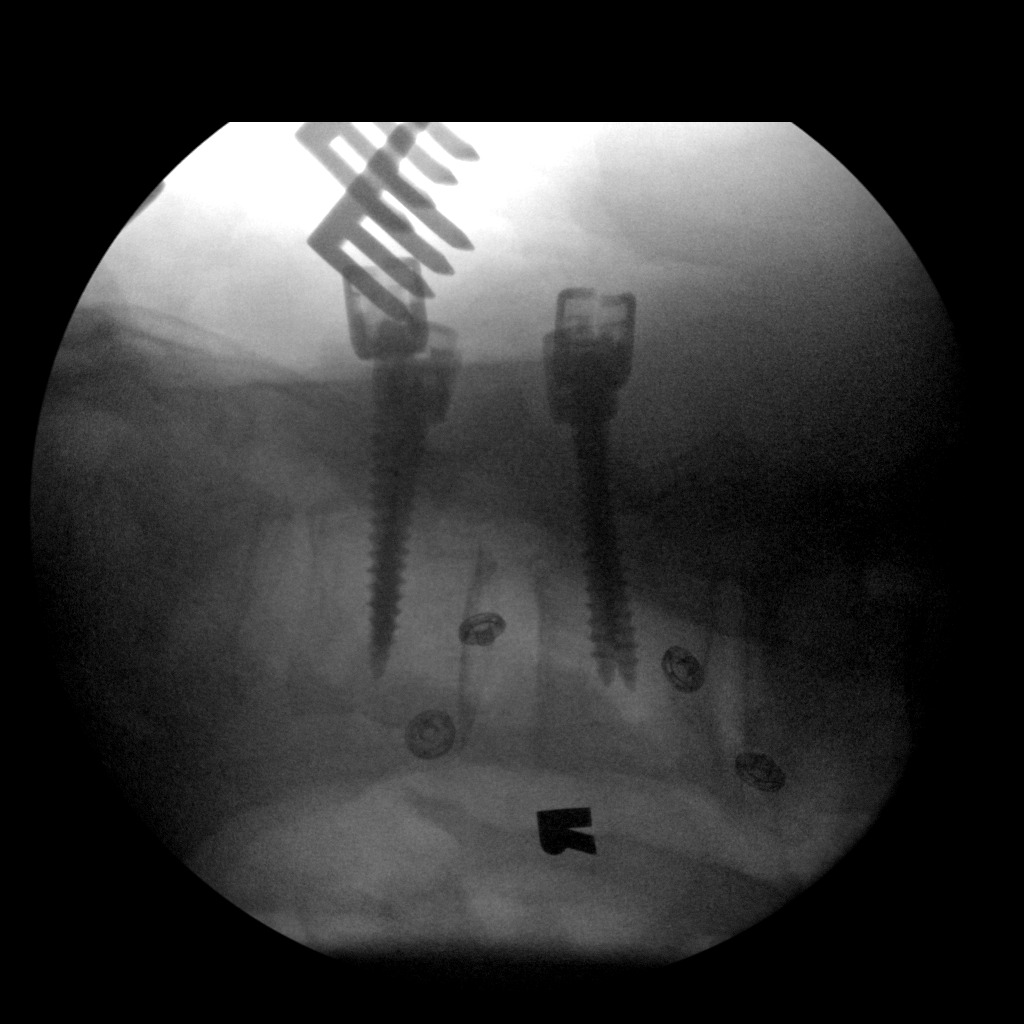
[im 3/3]
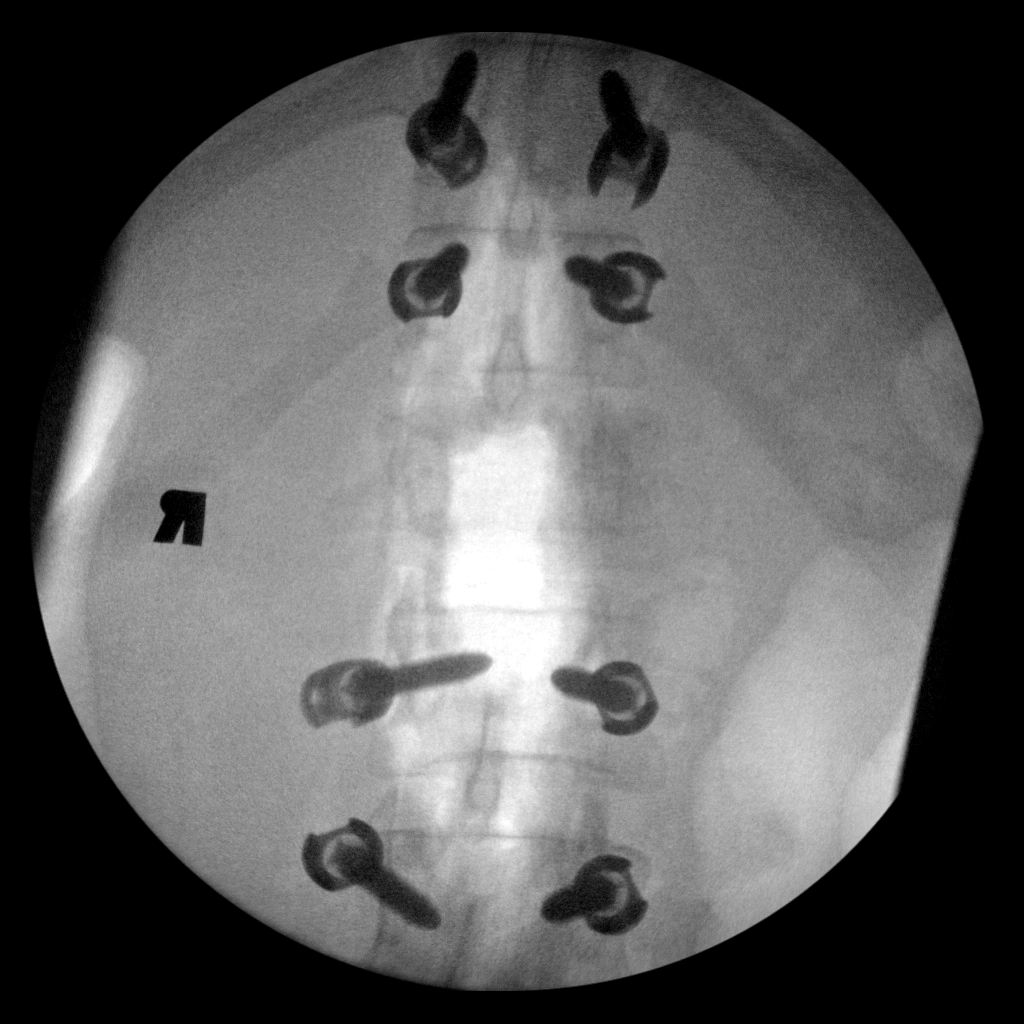

[3 of 3 positions shown; findings below may reference images not displayed]

FLUOROSCOPY TIME:  Fluoroscopy Time:  54 seconds

Radiation Exposure Index (if provided by the fluoroscopic device):
Not available

Number of Acquired Spot Images: 3
FINDINGS: Pedicle screws are noted at T11, T12, L2 and L3. The known L1 burst
fracture is again noted.
IMPRESSION: Thoracolumbar fixation

## 2018-06-29 MED ORDER — HYDROMORPHONE HCL 1 MG/ML IJ SOLN
1.0000 mg | Freq: Once | INTRAMUSCULAR | Status: AC
  Administered 2018-06-29: 1 mg via INTRAVENOUS
  Filled 2018-06-29: qty 1

## 2018-06-29 MED ORDER — PROCHLORPERAZINE EDISYLATE 10 MG/2ML IJ SOLN
5.0000 mg | Freq: Once | INTRAMUSCULAR | Status: AC
  Administered 2018-06-29: 15:00:00 5 mg via INTRAVENOUS
  Filled 2018-06-29: qty 2

## 2018-06-29 MED ORDER — HYDROMORPHONE HCL 1 MG/ML IJ SOLN
1.0000 mg | Freq: Once | INTRAMUSCULAR | Status: AC
  Administered 2018-06-29: 15:00:00 1 mg via INTRAVENOUS
  Filled 2018-06-29: qty 1

## 2018-06-29 MED ORDER — PROCHLORPERAZINE EDISYLATE 10 MG/2ML IJ SOLN
5.0000 mg | Freq: Once | INTRAMUSCULAR | Status: AC
  Administered 2018-06-29: 13:00:00 5 mg via INTRAVENOUS
  Filled 2018-06-29: qty 2

## 2018-06-29 MED ORDER — POTASSIUM CHLORIDE CRYS ER 20 MEQ PO TBCR
40.0000 meq | EXTENDED_RELEASE_TABLET | Freq: Once | ORAL | Status: AC
  Administered 2018-06-29: 15:00:00 40 meq via ORAL
  Filled 2018-06-29: qty 2

## 2018-06-29 MED ORDER — PROMETHAZINE HCL 25 MG PO TABS: 25.0000 mg | ORAL_TABLET | Freq: Four times a day (QID) | ORAL | 1 refills | Status: DC | PRN

## 2018-06-29 MED ORDER — LACTATED RINGERS IV BOLUS
1000.0000 mL | Freq: Once | INTRAVENOUS | Status: AC
  Administered 2018-06-29: 1000 mL via INTRAVENOUS

## 2018-06-29 NOTE — ED Notes (Signed)
Patient denies pain and is resting comfortably.  

## 2018-06-29 NOTE — ED Notes (Signed)
Pt with emesis, about 100cc of emesis noted in bag
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
## 2018-06-29 NOTE — ED Triage Notes (Signed)
Pt in today due to continued N/V. Pt reports that she started vomiting Sunday. Last vomiting when EMS arrived. Pt reports off and on vomiting during night. Unable to keep anything down. Pt is paraplegic and has wounds to buttocks

## 2018-06-29 NOTE — ED Notes (Signed)
Called EMS for transport back home 

## 2018-06-29 NOTE — ED Notes (Signed)
PO challenge done, pt able to drink a cup of water, also provided pt with sprite as well.

## 2018-06-29 NOTE — Discharge Instructions (Addendum)
Your test and xrays have been reviewed and no acute problem noted at this time. Your exam and testing seem to suggest a stomach bug/ gastroenteritis.  Please use promethazine for nausea or vomiting. Ms Blanch Media and the medical team are working on possible nursing facility placement for you. There are delays due to the current pandemic. They will reach out to you with additional information. Wash hands frequently. Continue current medications.

## 2018-07-09 ENCOUNTER — Encounter (HOSPITAL_BASED_OUTPATIENT_CLINIC_OR_DEPARTMENT_OTHER): Payer: Medicaid Other | Attending: Internal Medicine

## 2018-07-09 DIAGNOSIS — G8222 Paraplegia, incomplete: Secondary | ICD-10-CM | POA: Insufficient documentation

## 2018-07-09 DIAGNOSIS — L89154 Pressure ulcer of sacral region, stage 4: Secondary | ICD-10-CM | POA: Insufficient documentation

## 2018-07-23 DIAGNOSIS — G8222 Paraplegia, incomplete: Secondary | ICD-10-CM | POA: Diagnosis not present

## 2018-07-23 DIAGNOSIS — L89154 Pressure ulcer of sacral region, stage 4: Secondary | ICD-10-CM | POA: Diagnosis not present

## 2018-08-13 ENCOUNTER — Encounter (HOSPITAL_BASED_OUTPATIENT_CLINIC_OR_DEPARTMENT_OTHER): Payer: Medicaid Other | Attending: Internal Medicine

## 2018-08-13 DIAGNOSIS — L89154 Pressure ulcer of sacral region, stage 4: Secondary | ICD-10-CM | POA: Insufficient documentation

## 2018-09-06 ENCOUNTER — Other Ambulatory Visit: Payer: Self-pay

## 2018-09-06 ENCOUNTER — Encounter (HOSPITAL_BASED_OUTPATIENT_CLINIC_OR_DEPARTMENT_OTHER): Payer: Medicaid Other | Attending: Internal Medicine

## 2018-09-06 DIAGNOSIS — L89154 Pressure ulcer of sacral region, stage 4: Secondary | ICD-10-CM | POA: Insufficient documentation

## 2018-09-06 DIAGNOSIS — L97212 Non-pressure chronic ulcer of right calf with fat layer exposed: Secondary | ICD-10-CM | POA: Insufficient documentation

## 2018-09-06 DIAGNOSIS — R197 Diarrhea, unspecified: Secondary | ICD-10-CM | POA: Diagnosis not present

## 2018-09-20 DIAGNOSIS — L89154 Pressure ulcer of sacral region, stage 4: Secondary | ICD-10-CM | POA: Diagnosis not present

## 2018-10-15 ENCOUNTER — Encounter (HOSPITAL_BASED_OUTPATIENT_CLINIC_OR_DEPARTMENT_OTHER): Payer: Medicaid Other | Attending: Internal Medicine

## 2018-10-15 DIAGNOSIS — L89154 Pressure ulcer of sacral region, stage 4: Secondary | ICD-10-CM | POA: Diagnosis not present

## 2018-10-15 DIAGNOSIS — G8222 Paraplegia, incomplete: Secondary | ICD-10-CM | POA: Diagnosis not present

## 2018-10-15 DIAGNOSIS — L97212 Non-pressure chronic ulcer of right calf with fat layer exposed: Secondary | ICD-10-CM | POA: Insufficient documentation

## 2018-10-29 ENCOUNTER — Emergency Department (HOSPITAL_COMMUNITY): Payer: Medicaid Other

## 2018-10-29 ENCOUNTER — Other Ambulatory Visit: Payer: Self-pay

## 2018-10-29 ENCOUNTER — Emergency Department (HOSPITAL_COMMUNITY)
Admission: EM | Admit: 2018-10-29 | Discharge: 2018-10-30 | Disposition: A | Discharge status: Home / Self Care | Payer: Medicaid Other | Attending: Emergency Medicine | Admitting: Emergency Medicine

## 2018-10-29 ENCOUNTER — Encounter (HOSPITAL_COMMUNITY): Payer: Self-pay | Admitting: Emergency Medicine

## 2018-10-29 DIAGNOSIS — G8220 Paraplegia, unspecified: Secondary | ICD-10-CM | POA: Insufficient documentation

## 2018-10-29 DIAGNOSIS — Z79899 Other long term (current) drug therapy: Secondary | ICD-10-CM | POA: Diagnosis not present

## 2018-10-29 DIAGNOSIS — M545 Low back pain, unspecified: Secondary | ICD-10-CM

## 2018-10-29 DIAGNOSIS — F17210 Nicotine dependence, cigarettes, uncomplicated: Secondary | ICD-10-CM | POA: Insufficient documentation

## 2018-10-29 DIAGNOSIS — E039 Hypothyroidism, unspecified: Secondary | ICD-10-CM | POA: Insufficient documentation

## 2018-10-29 DIAGNOSIS — W06XXXA Fall from bed, initial encounter: Secondary | ICD-10-CM | POA: Insufficient documentation

## 2018-10-29 MED ORDER — OXYCODONE HCL 5 MG PO TABS
10.0000 mg | ORAL_TABLET | Freq: Once | ORAL | Status: AC
  Administered 2018-10-29: 22:00:00 10 mg via ORAL
  Filled 2018-10-29: qty 2

## 2018-10-29 NOTE — Discharge Instructions (Signed)
Your xrays show no broken bones See your doctor as needed
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
## 2018-10-29 NOTE — ED Triage Notes (Addendum)
Pt who is paraplegic, fell out of bed a week ago and landed on her buttocks. Pt here via EMS for increased pain over the last week to her lower back.

## 2018-11-07 ENCOUNTER — Encounter (HOSPITAL_COMMUNITY): Payer: Self-pay | Admitting: Emergency Medicine

## 2018-11-07 ENCOUNTER — Inpatient Hospital Stay (HOSPITAL_COMMUNITY)
Admission: EM | Admit: 2018-11-07 | Discharge: 2018-11-12 | DRG: 698 | Disposition: A | Discharge status: Home Health | Payer: Medicaid Other | Attending: Internal Medicine | Admitting: Internal Medicine

## 2018-11-07 ENCOUNTER — Other Ambulatory Visit: Payer: Self-pay

## 2018-11-07 ENCOUNTER — Emergency Department (HOSPITAL_COMMUNITY): Payer: Medicaid Other

## 2018-11-07 DIAGNOSIS — E876 Hypokalemia: Secondary | ICD-10-CM | POA: Diagnosis present

## 2018-11-07 DIAGNOSIS — E039 Hypothyroidism, unspecified: Secondary | ICD-10-CM | POA: Diagnosis present

## 2018-11-07 DIAGNOSIS — E038 Other specified hypothyroidism: Secondary | ICD-10-CM | POA: Diagnosis not present

## 2018-11-07 DIAGNOSIS — N312 Flaccid neuropathic bladder, not elsewhere classified: Secondary | ICD-10-CM | POA: Diagnosis present

## 2018-11-07 DIAGNOSIS — K219 Gastro-esophageal reflux disease without esophagitis: Secondary | ICD-10-CM | POA: Diagnosis present

## 2018-11-07 DIAGNOSIS — D500 Iron deficiency anemia secondary to blood loss (chronic): Secondary | ICD-10-CM | POA: Diagnosis present

## 2018-11-07 DIAGNOSIS — G894 Chronic pain syndrome: Secondary | ICD-10-CM | POA: Diagnosis present

## 2018-11-07 DIAGNOSIS — Z981 Arthrodesis status: Secondary | ICD-10-CM

## 2018-11-07 DIAGNOSIS — T83518A Infection and inflammatory reaction due to other urinary catheter, initial encounter: Secondary | ICD-10-CM | POA: Diagnosis present

## 2018-11-07 DIAGNOSIS — G8220 Paraplegia, unspecified: Secondary | ICD-10-CM | POA: Diagnosis present

## 2018-11-07 DIAGNOSIS — F17210 Nicotine dependence, cigarettes, uncomplicated: Secondary | ICD-10-CM | POA: Diagnosis present

## 2018-11-07 DIAGNOSIS — L8990 Pressure ulcer of unspecified site, unspecified stage: Secondary | ICD-10-CM | POA: Diagnosis present

## 2018-11-07 DIAGNOSIS — N390 Urinary tract infection, site not specified: Secondary | ICD-10-CM | POA: Diagnosis present

## 2018-11-07 DIAGNOSIS — D509 Iron deficiency anemia, unspecified: Secondary | ICD-10-CM | POA: Diagnosis present

## 2018-11-07 DIAGNOSIS — N136 Pyonephrosis: Secondary | ICD-10-CM | POA: Diagnosis present

## 2018-11-07 DIAGNOSIS — E871 Hypo-osmolality and hyponatremia: Secondary | ICD-10-CM | POA: Diagnosis present

## 2018-11-07 DIAGNOSIS — T1490XS Injury, unspecified, sequela: Secondary | ICD-10-CM | POA: Diagnosis present

## 2018-11-07 DIAGNOSIS — Z79899 Other long term (current) drug therapy: Secondary | ICD-10-CM

## 2018-11-07 DIAGNOSIS — Y846 Urinary catheterization as the cause of abnormal reaction of the patient, or of later complication, without mention of misadventure at the time of the procedure: Secondary | ICD-10-CM | POA: Diagnosis present

## 2018-11-07 DIAGNOSIS — F329 Major depressive disorder, single episode, unspecified: Secondary | ICD-10-CM | POA: Diagnosis present

## 2018-11-07 DIAGNOSIS — L89154 Pressure ulcer of sacral region, stage 4: Secondary | ICD-10-CM | POA: Diagnosis present

## 2018-11-07 DIAGNOSIS — L8945 Pressure ulcer of contiguous site of back, buttock and hip, unstageable: Secondary | ICD-10-CM

## 2018-11-07 DIAGNOSIS — R509 Fever, unspecified: Secondary | ICD-10-CM | POA: Diagnosis present

## 2018-11-07 DIAGNOSIS — E43 Unspecified severe protein-calorie malnutrition: Secondary | ICD-10-CM | POA: Diagnosis present

## 2018-11-07 DIAGNOSIS — N319 Neuromuscular dysfunction of bladder, unspecified: Secondary | ICD-10-CM | POA: Diagnosis present

## 2018-11-07 DIAGNOSIS — R6520 Severe sepsis without septic shock: Secondary | ICD-10-CM | POA: Diagnosis present

## 2018-11-07 DIAGNOSIS — Z79890 Hormone replacement therapy (postmenopausal): Secondary | ICD-10-CM | POA: Diagnosis not present

## 2018-11-07 DIAGNOSIS — F411 Generalized anxiety disorder: Secondary | ICD-10-CM | POA: Diagnosis present

## 2018-11-07 DIAGNOSIS — N10 Acute tubulo-interstitial nephritis: Secondary | ICD-10-CM

## 2018-11-07 DIAGNOSIS — A419 Sepsis, unspecified organism: Secondary | ICD-10-CM | POA: Diagnosis present

## 2018-11-07 DIAGNOSIS — Z20828 Contact with and (suspected) exposure to other viral communicable diseases: Secondary | ICD-10-CM | POA: Diagnosis present

## 2018-11-07 DIAGNOSIS — Z9049 Acquired absence of other specified parts of digestive tract: Secondary | ICD-10-CM

## 2018-11-07 DIAGNOSIS — M4628 Osteomyelitis of vertebra, sacral and sacrococcygeal region: Secondary | ICD-10-CM | POA: Diagnosis present

## 2018-11-07 LAB — URINALYSIS, ROUTINE W REFLEX MICROSCOPIC
Bilirubin Urine: NEGATIVE
Glucose, UA: NEGATIVE mg/dL
Ketones, ur: NEGATIVE mg/dL
Nitrite: NEGATIVE
Protein, ur: 30 mg/dL — AB
Specific Gravity, Urine: 1.01 (ref 1.005–1.030)
WBC, UA: >50 WBC/hpf — ABNORMAL HIGH (ref 0–5)
pH: 6 (ref 5.0–8.0)

## 2018-11-07 LAB — CBC WITH DIFFERENTIAL/PLATELET
Abs Immature Granulocytes: 0.23 10*3/uL — ABNORMAL HIGH (ref 0.00–0.07)
Basophils Absolute: 0.1 10*3/uL (ref 0.0–0.1)
Basophils Relative: 0 %
Eosinophils Absolute: 0.1 10*3/uL (ref 0.0–0.5)
Eosinophils Relative: 0 %
HCT: 36.6 % (ref 36.0–46.0)
Hemoglobin: 11.3 g/dL — ABNORMAL LOW (ref 12.0–15.0)
Immature Granulocytes: 1 %
Lymphocytes Relative: 5 %
Lymphs Abs: 1.3 10*3/uL (ref 0.7–4.0)
MCH: 27.9 pg (ref 26.0–34.0)
MCHC: 30.9 g/dL (ref 30.0–36.0)
MCV: 90.4 fL (ref 80.0–100.0)
Monocytes Absolute: 2.2 10*3/uL — ABNORMAL HIGH (ref 0.1–1.0)
Monocytes Relative: 9 %
Neutro Abs: 22.4 10*3/uL — ABNORMAL HIGH (ref 1.7–7.7)
Neutrophils Relative %: 85 %
Platelets: 434 10*3/uL — ABNORMAL HIGH (ref 150–400)
RBC: 4.05 MIL/uL (ref 3.87–5.11)
RDW: 16.8 % — ABNORMAL HIGH (ref 11.5–15.5)
WBC: 26.4 10*3/uL — ABNORMAL HIGH (ref 4.0–10.5)
nRBC: 0 % (ref 0.0–0.2)

## 2018-11-07 LAB — COMPREHENSIVE METABOLIC PANEL
ALT: 23 U/L (ref 0–44)
AST: 18 U/L (ref 15–41)
Albumin: 3 g/dL — ABNORMAL LOW (ref 3.5–5.0)
Alkaline Phosphatase: 121 U/L (ref 38–126)
Anion gap: 7 (ref 5–15)
BUN: 8 mg/dL (ref 6–20)
CO2: 27 mmol/L (ref 22–32)
Calcium: 7.9 mg/dL — ABNORMAL LOW (ref 8.9–10.3)
Chloride: 98 mmol/L (ref 98–111)
Creatinine, Ser: 0.63 mg/dL (ref 0.44–1.00)
GFR calc Af Amer: >60 mL/min (ref >60)
GFR calc non Af Amer: >60 mL/min (ref >60)
Glucose, Bld: 128 mg/dL — ABNORMAL HIGH (ref 70–99)
Potassium: 3.4 mmol/L — ABNORMAL LOW (ref 3.5–5.1)
Sodium: 132 mmol/L — ABNORMAL LOW (ref 135–145)
Total Bilirubin: 1.2 mg/dL (ref 0.3–1.2)
Total Protein: 7.1 g/dL (ref 6.5–8.1)

## 2018-11-07 LAB — LIPASE, BLOOD: Lipase: 18 U/L (ref 11–51)

## 2018-11-07 LAB — SARS CORONAVIRUS 2 BY RT PCR (HOSPITAL ORDER, PERFORMED IN ~~LOC~~ HOSPITAL LAB): SARS Coronavirus 2: NEGATIVE

## 2018-11-07 LAB — LACTIC ACID, PLASMA
Lactic Acid, Venous: 1.7 mmol/L (ref 0.5–1.9)
Lactic Acid, Venous: 1.6 mmol/L (ref 0.5–1.9)

## 2018-11-07 MED ORDER — ENOXAPARIN SODIUM 40 MG/0.4ML ~~LOC~~ SOLN: 40.0000 mg | SUBCUTANEOUS | Status: DC

## 2018-11-07 MED ORDER — SODIUM CHLORIDE 0.9 % IV BOLUS (SEPSIS)
1000.0000 mL | Freq: Once | INTRAVENOUS | Status: AC
  Administered 2018-11-07: 17:00:00 1000 mL via INTRAVENOUS

## 2018-11-07 MED ORDER — VANCOMYCIN HCL IN DEXTROSE 1-5 GM/200ML-% IV SOLN
1000.0000 mg | Freq: Three times a day (TID) | INTRAVENOUS | Status: DC
  Filled 2018-11-07: qty 200

## 2018-11-07 MED ORDER — ONDANSETRON HCL 4 MG/2ML IJ SOLN
4.0000 mg | Freq: Once | INTRAMUSCULAR | Status: AC
  Administered 2018-11-07: 22:00:00 4 mg via INTRAVENOUS
  Filled 2018-11-07: qty 2

## 2018-11-07 MED ORDER — VANCOMYCIN HCL IN DEXTROSE 1-5 GM/200ML-% IV SOLN
1000.0000 mg | Freq: Once | INTRAVENOUS | Status: AC
  Administered 2018-11-07: 1000 mg via INTRAVENOUS
  Filled 2018-11-07: qty 200

## 2018-11-07 MED ORDER — SODIUM CHLORIDE 0.9 % IV SOLN
2.0000 g | Freq: Three times a day (TID) | INTRAVENOUS | Status: DC
  Administered 2018-11-08 – 2018-11-12 (×14): 2 g via INTRAVENOUS
  Filled 2018-11-07 (×14): qty 2

## 2018-11-07 MED ORDER — HYDROMORPHONE HCL 1 MG/ML IJ SOLN
1.0000 mg | Freq: Once | INTRAMUSCULAR | Status: AC
  Administered 2018-11-07: 21:00:00 1 mg via INTRAVENOUS
  Filled 2018-11-07: qty 1

## 2018-11-07 MED ORDER — ACETAMINOPHEN 500 MG PO TABS
1000.0000 mg | ORAL_TABLET | Freq: Once | ORAL | Status: AC
  Administered 2018-11-07: 17:00:00 1000 mg via ORAL
  Filled 2018-11-07: qty 2

## 2018-11-07 MED ORDER — ACETAMINOPHEN 325 MG PO TABS
650.0000 mg | ORAL_TABLET | Freq: Once | ORAL | Status: AC
  Administered 2018-11-07: 22:00:00 650 mg via ORAL
  Filled 2018-11-07: qty 2

## 2018-11-07 MED ORDER — SODIUM CHLORIDE 0.9 % IV BOLUS
1000.0000 mL | Freq: Once | INTRAVENOUS | Status: AC
  Administered 2018-11-07: 1000 mL via INTRAVENOUS

## 2018-11-07 MED ORDER — SODIUM CHLORIDE 0.9 % IV SOLN
1000.0000 mL | INTRAVENOUS | Status: DC
  Administered 2018-11-07: 17:00:00 1000 mL via INTRAVENOUS

## 2018-11-07 MED ORDER — HYDROMORPHONE HCL 1 MG/ML IJ SOLN
0.5000 mg | Freq: Once | INTRAMUSCULAR | Status: AC
  Administered 2018-11-07: 18:00:00 0.5 mg via INTRAVENOUS
  Filled 2018-11-07: qty 1

## 2018-11-07 MED ORDER — ENOXAPARIN SODIUM 60 MG/0.6ML ~~LOC~~ SOLN
0.5000 mg/kg | SUBCUTANEOUS | Status: DC
  Administered 2018-11-08 – 2018-11-11 (×5): 55 mg via SUBCUTANEOUS
  Filled 2018-11-07 (×5): qty 0.6

## 2018-11-07 MED ORDER — ONDANSETRON HCL 4 MG/2ML IJ SOLN
4.0000 mg | Freq: Once | INTRAMUSCULAR | Status: AC
  Administered 2018-11-07: 19:00:00 4 mg via INTRAVENOUS
  Filled 2018-11-07: qty 2

## 2018-11-07 MED ORDER — HYDROMORPHONE HCL 1 MG/ML IJ SOLN
1.0000 mg | Freq: Once | INTRAMUSCULAR | Status: AC
  Administered 2018-11-07: 22:00:00 1 mg via INTRAVENOUS
  Filled 2018-11-07: qty 1

## 2018-11-07 MED ORDER — VANCOMYCIN HCL 10 G IV SOLR
1500.0000 mg | Freq: Once | INTRAVENOUS | Status: AC
  Administered 2018-11-08: 1500 mg via INTRAVENOUS
  Filled 2018-11-07: qty 1500

## 2018-11-07 MED ORDER — ONDANSETRON HCL 4 MG/2ML IJ SOLN
4.0000 mg | Freq: Once | INTRAMUSCULAR | Status: AC
  Administered 2018-11-07: 4 mg via INTRAVENOUS
  Filled 2018-11-07: qty 2

## 2018-11-07 MED ORDER — CEFAZOLIN SODIUM-DEXTROSE 2-4 GM/100ML-% IV SOLN
2.0000 g | Freq: Once | INTRAVENOUS | Status: AC
  Administered 2018-11-07: 18:00:00 2 g via INTRAVENOUS
  Filled 2018-11-07: qty 100

## 2018-11-07 NOTE — ED Provider Notes (Signed)
Pt's care assumed at 5pm.  Pt is a paraplegic who has a large decubital ulcer concerning for osteo.  Pt has a fever to 102.  Ct scan of pelvis, covid and urine are pending.  Pt has had decreased temp with tylenol.  Pt given ancef and vancomycin.   Ct returned and shows chronic osteo.  Covid is  negative.   Ua shows greater than 50 wbc's    I spoke with Dr. Maudie Mercury who will admit    [name] 11/07/18 2258    Sherwood Gambler, MD 11/07/18 [phone]
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
## 2018-11-07 NOTE — Progress Notes (Signed)
Pharmacy Antibiotic Note  [name] is a 38 y.o. female admitted on 11/07/2018 with sepsis.  Pharmacy has been consulted for vancomycin and cefepime dosing. Patient received vancomycin 1000 mg @1853   Plan: Vancomycin 1500 mg x 1 to complete a loading dose of 2500 mg, followed by vancomycin 1000 mg IV every 8 hours.  Goal trough 15-20 mcg/mL. Cefepime 2 grams IV every 8 hours Monitor clinical progress, cultures/sensitivities, renal function, abx plan, Vancomycin levels as indicated    Temp (24hrs), Avg:101.9 F (38.8 C), Min:100 F (37.8 C), Max:103.5 F (39.7 C)  Recent Labs  Lab 11/07/18 1616 11/07/18 1745  WBC 26.4*  --   CREATININE 0.63  --   LATICACIDVEN 1.7 1.6    Estimated Creatinine Clearance: 121.4 mL/min (by C-G formula based on SCr of 0.63 mg/dL).    Allergies  Allergen Reactions  . No Known Allergies      Thank you for allowing pharmacy to be a part of this patient's care.  Jens Som, PharmD 11/07/2018 11:17 PM
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
## 2018-11-07 NOTE — ED Triage Notes (Signed)
Lower back pain, N/V, fever for past couple of days

## 2018-11-08 ENCOUNTER — Other Ambulatory Visit: Payer: Self-pay

## 2018-11-08 ENCOUNTER — Encounter (HOSPITAL_COMMUNITY): Payer: Self-pay | Admitting: Family Medicine

## 2018-11-08 DIAGNOSIS — L8990 Pressure ulcer of unspecified site, unspecified stage: Secondary | ICD-10-CM | POA: Diagnosis present

## 2018-11-08 DIAGNOSIS — T1490XS Injury, unspecified, sequela: Secondary | ICD-10-CM

## 2018-11-08 DIAGNOSIS — L89154 Pressure ulcer of sacral region, stage 4: Secondary | ICD-10-CM | POA: Diagnosis present

## 2018-11-08 DIAGNOSIS — E876 Hypokalemia: Secondary | ICD-10-CM

## 2018-11-08 DIAGNOSIS — E871 Hypo-osmolality and hyponatremia: Secondary | ICD-10-CM

## 2018-11-08 DIAGNOSIS — R509 Fever, unspecified: Secondary | ICD-10-CM

## 2018-11-08 DIAGNOSIS — K219 Gastro-esophageal reflux disease without esophagitis: Secondary | ICD-10-CM | POA: Diagnosis present

## 2018-11-08 DIAGNOSIS — M4628 Osteomyelitis of vertebra, sacral and sacrococcygeal region: Secondary | ICD-10-CM | POA: Diagnosis present

## 2018-11-08 DIAGNOSIS — N312 Flaccid neuropathic bladder, not elsewhere classified: Secondary | ICD-10-CM

## 2018-11-08 LAB — COMPREHENSIVE METABOLIC PANEL
ALT: 18 U/L (ref 0–44)
AST: 14 U/L — ABNORMAL LOW (ref 15–41)
Albumin: 2.6 g/dL — ABNORMAL LOW (ref 3.5–5.0)
Alkaline Phosphatase: 118 U/L (ref 38–126)
Anion gap: 8 (ref 5–15)
BUN: 7 mg/dL (ref 6–20)
CO2: 24 mmol/L (ref 22–32)
Calcium: 7.6 mg/dL — ABNORMAL LOW (ref 8.9–10.3)
Chloride: 106 mmol/L (ref 98–111)
Creatinine, Ser: 0.54 mg/dL (ref 0.44–1.00)
GFR calc Af Amer: >60 mL/min (ref >60)
GFR calc non Af Amer: >60 mL/min (ref >60)
Glucose, Bld: 108 mg/dL — ABNORMAL HIGH (ref 70–99)
Potassium: 3 mmol/L — ABNORMAL LOW (ref 3.5–5.1)
Sodium: 138 mmol/L (ref 135–145)
Total Bilirubin: 1.9 mg/dL — ABNORMAL HIGH (ref 0.3–1.2)
Total Protein: 6 g/dL — ABNORMAL LOW (ref 6.5–8.1)

## 2018-11-08 LAB — CBC
HCT: 32.2 % — ABNORMAL LOW (ref 36.0–46.0)
Hemoglobin: 9.8 g/dL — ABNORMAL LOW (ref 12.0–15.0)
MCH: 27.2 pg (ref 26.0–34.0)
MCHC: 30.4 g/dL (ref 30.0–36.0)
MCV: 89.4 fL (ref 80.0–100.0)
Platelets: 383 10*3/uL (ref 150–400)
RBC: 3.6 MIL/uL — ABNORMAL LOW (ref 3.87–5.11)
RDW: 16.7 % — ABNORMAL HIGH (ref 11.5–15.5)
WBC: 26.1 10*3/uL — ABNORMAL HIGH (ref 4.0–10.5)
nRBC: 0 % (ref 0.0–0.2)

## 2018-11-08 LAB — MRSA PCR SCREENING: MRSA by PCR: NEGATIVE

## 2018-11-08 LAB — MAGNESIUM: Magnesium: 1.7 mg/dL (ref 1.7–2.4)

## 2018-11-08 MED ORDER — VITAMIN D 25 MCG (1000 UNIT) PO TABS
2000.0000 [IU] | ORAL_TABLET | Freq: Every day | ORAL | Status: DC
  Administered 2018-11-08 – 2018-11-12 (×5): 2000 [IU] via ORAL
  Filled 2018-11-08 (×5): qty 2

## 2018-11-08 MED ORDER — VANCOMYCIN HCL IN DEXTROSE 1-5 GM/200ML-% IV SOLN
1000.0000 mg | Freq: Three times a day (TID) | INTRAVENOUS | Status: DC
  Administered 2018-11-08 – 2018-11-10 (×8): 1000 mg via INTRAVENOUS
  Filled 2018-11-08 (×8): qty 200

## 2018-11-08 MED ORDER — PANTOPRAZOLE SODIUM 40 MG PO TBEC: 40.0000 mg | DELAYED_RELEASE_TABLET | Freq: Every day | ORAL | Status: DC

## 2018-11-08 MED ORDER — ALPRAZOLAM 0.25 MG PO TABS
0.2500 mg | ORAL_TABLET | Freq: Three times a day (TID) | ORAL | Status: DC
  Administered 2018-11-08 – 2018-11-12 (×12): 0.25 mg via ORAL
  Filled 2018-11-08 (×12): qty 1

## 2018-11-08 MED ORDER — VITAMIN C 500 MG PO TABS
500.0000 mg | ORAL_TABLET | Freq: Two times a day (BID) | ORAL | Status: DC
  Administered 2018-11-08 – 2018-11-12 (×9): 500 mg via ORAL
  Filled 2018-11-08 (×9): qty 1

## 2018-11-08 MED ORDER — OXYCODONE HCL 5 MG PO TABS
10.0000 mg | ORAL_TABLET | Freq: Two times a day (BID) | ORAL | Status: DC | PRN
  Administered 2018-11-08 – 2018-11-10 (×5): 10 mg via ORAL
  Filled 2018-11-08 (×5): qty 2

## 2018-11-08 MED ORDER — METHOCARBAMOL 500 MG PO TABS
1000.0000 mg | ORAL_TABLET | Freq: Four times a day (QID) | ORAL | Status: DC | PRN
  Administered 2018-11-08 – 2018-11-10 (×3): 1000 mg via ORAL
  Filled 2018-11-08 (×3): qty 2

## 2018-11-08 MED ORDER — PANTOPRAZOLE SODIUM 40 MG IV SOLR
40.0000 mg | INTRAVENOUS | Status: DC
  Administered 2018-11-08 – 2018-11-12 (×5): 40 mg via INTRAVENOUS
  Filled 2018-11-08 (×5): qty 40

## 2018-11-08 MED ORDER — NICOTINE 21 MG/24HR TD PT24
21.0000 mg | MEDICATED_PATCH | Freq: Every day | TRANSDERMAL | Status: DC
  Administered 2018-11-08 – 2018-11-11 (×4): 21 mg via TRANSDERMAL
  Filled 2018-11-08 (×5): qty 1

## 2018-11-08 MED ORDER — ACETAMINOPHEN 650 MG RE SUPP: 650.0000 mg | Freq: Four times a day (QID) | RECTAL | Status: DC | PRN

## 2018-11-08 MED ORDER — ACETAMINOPHEN 650 MG RE SUPP
650.0000 mg | Freq: Four times a day (QID) | RECTAL | Status: DC
  Administered 2018-11-08 – 2018-11-09 (×5): 650 mg via RECTAL
  Filled 2018-11-08 (×5): qty 1

## 2018-11-08 MED ORDER — ADULT MULTIVITAMIN W/MINERALS CH
1.0000 | ORAL_TABLET | Freq: Every day | ORAL | Status: DC
  Administered 2018-11-08 – 2018-11-12 (×5): 1 via ORAL
  Filled 2018-11-08 (×5): qty 1

## 2018-11-08 MED ORDER — HYDROMORPHONE HCL 1 MG/ML IJ SOLN
0.5000 mg | INTRAMUSCULAR | Status: DC | PRN
  Administered 2018-11-08 – 2018-11-09 (×2): 0.5 mg via INTRAVENOUS
  Filled 2018-11-08 (×2): qty 0.5

## 2018-11-08 MED ORDER — LOPERAMIDE HCL 2 MG PO CAPS: 2.0000 mg | ORAL_CAPSULE | Freq: Four times a day (QID) | ORAL | Status: DC | PRN

## 2018-11-08 MED ORDER — ONDANSETRON HCL 4 MG/2ML IJ SOLN
4.0000 mg | Freq: Four times a day (QID) | INTRAMUSCULAR | Status: DC | PRN
  Administered 2018-11-08: 03:00:00 4 mg via INTRAVENOUS
  Filled 2018-11-08: qty 2

## 2018-11-08 MED ORDER — PROMETHAZINE HCL 25 MG/ML IJ SOLN
12.5000 mg | Freq: Four times a day (QID) | INTRAMUSCULAR | Status: DC | PRN
  Administered 2018-11-08: 12.5 mg via INTRAVENOUS
  Filled 2018-11-08: qty 1

## 2018-11-08 MED ORDER — POTASSIUM CHLORIDE CRYS ER 20 MEQ PO TBCR
40.0000 meq | EXTENDED_RELEASE_TABLET | Freq: Once | ORAL | Status: AC
  Administered 2018-11-08: 40 meq via ORAL
  Filled 2018-11-08: qty 2

## 2018-11-08 MED ORDER — ACETAMINOPHEN 325 MG PO TABS: 650.0000 mg | ORAL_TABLET | Freq: Four times a day (QID) | ORAL | Status: DC | PRN

## 2018-11-08 MED ORDER — POTASSIUM CHLORIDE IN NACL 20-0.9 MEQ/L-% IV SOLN
INTRAVENOUS | Status: DC
  Administered 2018-11-08 – 2018-11-10 (×6): via INTRAVENOUS

## 2018-11-08 MED ORDER — VITAMIN B-12 1000 MCG PO TABS
1000.0000 ug | ORAL_TABLET | Freq: Every day | ORAL | Status: DC
  Administered 2018-11-08 – 2018-11-12 (×5): 1000 ug via ORAL
  Filled 2018-11-08 (×5): qty 1

## 2018-11-08 MED ORDER — ONDANSETRON HCL 4 MG/2ML IJ SOLN
4.0000 mg | INTRAMUSCULAR | Status: DC | PRN
  Administered 2018-11-08 – 2018-11-09 (×4): 4 mg via INTRAVENOUS
  Filled 2018-11-08 (×4): qty 2

## 2018-11-08 MED ORDER — PROMETHAZINE HCL 25 MG/ML IJ SOLN
6.2500 mg | Freq: Four times a day (QID) | INTRAMUSCULAR | Status: DC | PRN
  Administered 2018-11-08 – 2018-11-09 (×2): 6.25 mg via INTRAVENOUS
  Filled 2018-11-08 (×2): qty 1

## 2018-11-08 MED ORDER — GABAPENTIN 300 MG PO CAPS
600.0000 mg | ORAL_CAPSULE | Freq: Three times a day (TID) | ORAL | Status: DC
  Administered 2018-11-08 – 2018-11-12 (×12): 600 mg via ORAL
  Filled 2018-11-08 (×9): qty 2
  Filled 2018-11-08: qty 6
  Filled 2018-11-08 (×2): qty 2

## 2018-11-08 MED ORDER — NYSTATIN 100000 UNIT/GM EX POWD
1.0000 g | Freq: Two times a day (BID) | CUTANEOUS | Status: DC
  Administered 2018-11-08 – 2018-11-11 (×8): 1 g via TOPICAL
  Filled 2018-11-08 (×2): qty 15

## 2018-11-08 MED ORDER — LEVOTHYROXINE SODIUM 88 MCG PO TABS
88.0000 ug | ORAL_TABLET | Freq: Every day | ORAL | Status: DC
  Administered 2018-11-08 – 2018-11-12 (×5): 88 ug via ORAL
  Filled 2018-11-08 (×5): qty 1

## 2018-11-08 MED ORDER — OXYBUTYNIN CHLORIDE ER 5 MG PO TB24
5.0000 mg | ORAL_TABLET | Freq: Every day | ORAL | Status: DC
  Administered 2018-11-08 – 2018-11-12 (×5): 5 mg via ORAL
  Filled 2018-11-08 (×5): qty 1

## 2018-11-08 MED ORDER — CITALOPRAM HYDROBROMIDE 20 MG PO TABS
40.0000 mg | ORAL_TABLET | Freq: Every day | ORAL | Status: DC
  Administered 2018-11-08 – 2018-11-12 (×5): 40 mg via ORAL
  Filled 2018-11-08 (×5): qty 2

## 2018-11-08 MED ORDER — ACETAMINOPHEN 325 MG PO TABS
650.0000 mg | ORAL_TABLET | Freq: Four times a day (QID) | ORAL | Status: DC
  Administered 2018-11-08 – 2018-11-12 (×12): 650 mg via ORAL
  Filled 2018-11-08 (×14): qty 2

## 2018-11-08 MED ORDER — HYDROMORPHONE HCL 1 MG/ML IJ SOLN
0.5000 mg | Freq: Two times a day (BID) | INTRAMUSCULAR | Status: DC | PRN
  Administered 2018-11-08: 0.5 mg via INTRAVENOUS
  Filled 2018-11-08: qty 0.5

## 2018-11-08 MED ORDER — PROMETHAZINE HCL 12.5 MG PO TABS: 25.0000 mg | ORAL_TABLET | Freq: Four times a day (QID) | ORAL | Status: DC | PRN

## 2018-11-08 MED ORDER — CHLORHEXIDINE GLUCONATE CLOTH 2 % EX PADS
6.0000 | MEDICATED_PAD | Freq: Every day | CUTANEOUS | Status: DC
  Administered 2018-11-08 – 2018-11-11 (×4): 6 via TOPICAL

## 2018-11-08 MED ORDER — SENNOSIDES-DOCUSATE SODIUM 8.6-50 MG PO TABS: 1.0000 | ORAL_TABLET | Freq: Every day | ORAL | Status: DC | PRN
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
## 2018-11-08 NOTE — Consult Note (Signed)
[place] Nurse wound consult note Patient receiving care in AP ICU 5.  Consult completed remotely after review of record and conversation with primary RN, Anderson Malta. Reason for Consult: "pressure ulcer" Wound type: chronic, non-healing wound to coccyx, wound bed cannot be seen due to the small hole opening Pressure Injury POA: Yes Measurement: see flowsheet section Wound bed: cannot be visualized Drainage (amount, consistency, odor) none reported Periwound: contracted down Dressing procedure/placement/frequency:  Cut a width of Aquacel Ag+ Kellie Simmering [phone]) narrow enough to stick down into the hole for the wound at the coccyx.  Leave a "tail" piece hanging out so that it can be removed. Cover with a small foam dressing. Change daily. Monitor the wound area(s) for worsening of condition such as: Signs/symptoms of infection,  Increase in size,  Development of or worsening of odor, Development of pain, or increased pain at the affected locations.  Notify the medical team if any of these develop.  Thank you for the consult.  Discussed plan of care with the bedside nurse.  [place] nurse will not follow at this time.  Please re-consult the [place] team if needed.  Val Riles, RN, MSN, CWOCN, CNS-BC, pager [phone]

## 2018-11-08 NOTE — TOC Initial Note (Signed)
Transition of Care [facility]) - Initial/Assessment Note    Patient Details  Name: [name] MRN: [id number] Date of Birth: April 09, 1980  Transition of [facility]) CM/SW Contact:    Boneta Lucks, RN Phone Number: 11/08/2018, 1:25 PM  Clinical Narrative:     Patient admitted for sepsis and high risk for readmission. Patient lives at home with her Mother. She has a wheelchair with gel cushion. She used [place] or [facility] for transportation to appointments. She has a day time aide to assist. Her PCP has ordered some other DME they have not arrived. Patient is active with [facility] for RN for wound care. States RN comes out weekly and area is almost closed.  Vaughan Basta is aware of the admission and will follow. TOC to follow for other needs.               Expected Discharge Plan: [place] Barriers to Discharge: Continued Medical Work up   Patient Goals and CMS Choice Patient states their goals for this hospitalization and ongoing recovery are:: to return home.      Expected Discharge Plan and Services Expected Discharge Plan: [place]      Prior Living Arrangements/Services   Lives with:: Parents Patient language and need for interpreter reviewed:: Yes Do you feel safe going back to the place where you live?: Yes      Need for Family Participation in Patient Care: Yes (Comment)   Current home services: DME, Home RN, Other (comment)(aide) Criminal Activity/Legal Involvement Pertinent to Current Situation/Hospitalization: No - Comment as needed  Activities of Daily Living Home Assistive Devices/Equipment: Wheelchair ADL Screening (condition at time of admission) Patient's cognitive ability adequate to safely complete daily activities?: Yes Is the patient deaf or have difficulty hearing?: No Does the patient have difficulty seeing, even when wearing glasses/contacts?: No Does the patient have difficulty concentrating, remembering, or  making decisions?: No Patient able to express need for assistance with ADLs?: Yes Does the patient have difficulty dressing or bathing?: No Independently performs ADLs?: No Communication: Needs assistance Is this a change from baseline?: Pre-admission baseline Dressing (OT): Independent Grooming: Independent Feeding: Independent Bathing: Needs assistance Is this a change from baseline?: Pre-admission baseline Toileting: Needs assistance Is this a change from baseline?: Pre-admission baseline In/Out Bed: Dependent Is this a change from baseline?: Change from baseline, expected to last >3 days Walks in Home: Dependent Is this a change from baseline?: Pre-admission baseline Does the patient have difficulty walking or climbing stairs?: Yes Weakness of Legs: Both Weakness of Arms/Hands: None    Affect (typically observed): Accepting Orientation: : Oriented to Self, Oriented to Place, Oriented to  Time, Oriented to Situation Alcohol / Substance Use: Not Applicable Psych Involvement: No (comment)  Admission diagnosis:  Fever, unspecified fever cause [R50.9] Urinary tract infection without hematuria, site unspecified [N39.0] Pressure injury of contiguous region involving back and buttock, unstageable, unspecified laterality (HCC) [L89.45] Patient Active Problem List   Diagnosis Date Noted  . Fever 11/07/2018  . Protein-calorie malnutrition, severe ([place]) 11/07/2018  . Acute lower UTI 05/05/2018  . Acute lower UTI (urinary tract infection) 05/04/2018  . Hypokalemia 05/04/2018  . AGE (acute gastroenteritis) 05/04/2018  . Chronic pain 05/04/2018  . Tinea   . Diarrhea   . 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 PICC (peripherally inserted central catheter) in place 07/22/2016  . Sacral osteomyelitis ([place])   . Pressure injury of skin 06/11/2016  . Sepsis ([place]) 06/10/2016  .  Normocytic anemia 06/10/2016  . Hyponatremia 06/10/2016  . 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 L1 vertebral fracture ([place]) 04/07/2016  . Depression with anxiety 06/06/2013  . Dysmenorrhea 08/09/2012  . Irregular menstrual cycle 08/09/2012  . Shoulder pain, left 11/07/2011  . H/O vitamin D deficiency 09/02/2011  . Obesity 08/06/2010  . Hypothyroidism 08/06/2010  . Smoking 08/06/2010  . 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

## 2018-11-09 DIAGNOSIS — M4628 Osteomyelitis of vertebra, sacral and sacrococcygeal region: Secondary | ICD-10-CM

## 2018-11-09 DIAGNOSIS — K219 Gastro-esophageal reflux disease without esophagitis: Secondary | ICD-10-CM

## 2018-11-09 LAB — CBC WITH DIFFERENTIAL/PLATELET
Abs Immature Granulocytes: 0.06 10*3/uL (ref 0.00–0.07)
Basophils Absolute: 0 10*3/uL (ref 0.0–0.1)
Basophils Relative: 0 %
Eosinophils Absolute: 0.1 10*3/uL (ref 0.0–0.5)
Eosinophils Relative: 1 %
HCT: 28.1 % — ABNORMAL LOW (ref 36.0–46.0)
Hemoglobin: 8.6 g/dL — ABNORMAL LOW (ref 12.0–15.0)
Immature Granulocytes: 0 %
Lymphocytes Relative: 13 %
Lymphs Abs: 1.9 10*3/uL (ref 0.7–4.0)
MCH: 27.9 pg (ref 26.0–34.0)
MCHC: 30.6 g/dL (ref 30.0–36.0)
MCV: 91.2 fL (ref 80.0–100.0)
Monocytes Absolute: 1.5 10*3/uL — ABNORMAL HIGH (ref 0.1–1.0)
Monocytes Relative: 10 %
Neutro Abs: 11.2 10*3/uL — ABNORMAL HIGH (ref 1.7–7.7)
Neutrophils Relative %: 76 %
Platelets: 337 10*3/uL (ref 150–400)
RBC: 3.08 MIL/uL — ABNORMAL LOW (ref 3.87–5.11)
RDW: 17.1 % — ABNORMAL HIGH (ref 11.5–15.5)
WBC: 14.8 10*3/uL — ABNORMAL HIGH (ref 4.0–10.5)
nRBC: 0 % (ref 0.0–0.2)

## 2018-11-09 LAB — BASIC METABOLIC PANEL
Anion gap: 8 (ref 5–15)
BUN: 7 mg/dL (ref 6–20)
CO2: 23 mmol/L (ref 22–32)
Calcium: 7.7 mg/dL — ABNORMAL LOW (ref 8.9–10.3)
Chloride: 106 mmol/L (ref 98–111)
Creatinine, Ser: 0.52 mg/dL (ref 0.44–1.00)
GFR calc Af Amer: >60 mL/min (ref >60)
GFR calc non Af Amer: >60 mL/min (ref >60)
Glucose, Bld: 87 mg/dL (ref 70–99)
Potassium: 3 mmol/L — ABNORMAL LOW (ref 3.5–5.1)
Sodium: 137 mmol/L (ref 135–145)

## 2018-11-09 LAB — COMPREHENSIVE METABOLIC PANEL
ALT: 12 U/L (ref 0–44)
AST: 9 U/L — ABNORMAL LOW (ref 15–41)
Albumin: 2.4 g/dL — ABNORMAL LOW (ref 3.5–5.0)
Alkaline Phosphatase: 117 U/L (ref 38–126)
Anion gap: 6 (ref 5–15)
BUN: 8 mg/dL (ref 6–20)
CO2: 23 mmol/L (ref 22–32)
Calcium: 7.5 mg/dL — ABNORMAL LOW (ref 8.9–10.3)
Chloride: 108 mmol/L (ref 98–111)
Creatinine, Ser: 0.48 mg/dL (ref 0.44–1.00)
GFR calc Af Amer: >60 mL/min (ref >60)
GFR calc non Af Amer: >60 mL/min (ref >60)
Glucose, Bld: 91 mg/dL (ref 70–99)
Potassium: 2.7 mmol/L — CL (ref 3.5–5.1)
Sodium: 137 mmol/L (ref 135–145)
Total Bilirubin: 1 mg/dL (ref 0.3–1.2)
Total Protein: 5.7 g/dL — ABNORMAL LOW (ref 6.5–8.1)

## 2018-11-09 LAB — VANCOMYCIN, PEAK: Vancomycin Pk: 30 ug/mL (ref 30–40)

## 2018-11-09 LAB — MAGNESIUM: Magnesium: 2 mg/dL (ref 1.7–2.4)

## 2018-11-09 LAB — C-REACTIVE PROTEIN: CRP: 27.5 mg/dL — ABNORMAL HIGH (ref <1.0)

## 2018-11-09 LAB — SEDIMENTATION RATE: Sed Rate: 86 mm/hr — ABNORMAL HIGH (ref 0–22)

## 2018-11-09 LAB — VANCOMYCIN, TROUGH: Vancomycin Tr: 17 ug/mL (ref 15–20)

## 2018-11-09 MED ORDER — POTASSIUM CHLORIDE 10 MEQ/100ML IV SOLN
10.0000 meq | INTRAVENOUS | Status: AC
  Administered 2018-11-09 (×4): 10 meq via INTRAVENOUS
  Filled 2018-11-09 (×4): qty 100

## 2018-11-09 MED ORDER — POTASSIUM CHLORIDE 10 MEQ/100ML IV SOLN
10.0000 meq | INTRAVENOUS | Status: AC
  Administered 2018-11-09 (×4): 10 meq via INTRAVENOUS
  Filled 2018-11-09 (×4): qty 100
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
## 2018-11-09 NOTE — Progress Notes (Signed)
11/09/2018 1:47 PM  Update: I was able to get thru to Mother for update. She verbalized understanding.    Murvin Natal MD How to contact the [facility] Attending or Consulting provider [place] or covering provider during after hours [place], for this patient?  1. Check the care team in [facility] and look for a) attending/consulting [facility] provider listed and b) the [facility] team listed 2. Log into [email or web address] and use [place]'s universal password to access. If you do not have the password, please contact the hospital operator. 3. Locate the [facility] provider you are looking for under [facility] and page to a number that you can be directly reached. 4. If you still have difficulty reaching the provider, please page the [facility] (Director on Call) for the Hospitalists listed on amion for assistance.

## 2018-11-09 NOTE — Progress Notes (Signed)
CRITICAL VALUE ALERT  Critical Value:  K+ 2.7  Date & Time Notied:  11/09/18 0540  Provider Notified: dr. Orlin Hilding   Orders Received/Actions taken: see chart

## 2018-11-09 NOTE — Progress Notes (Signed)
Pharmacy Antibiotic Note  [name] is a 38 y.o. female admitted on 11/07/2018 with sepsis/PNA/osteo. Pharmacy has been consulted for vancomycin and cefepime dosing. Patient received vancomycin 1000 mg @1853   Plan: Vancomycin AUC 536. Goal 400-550. Continue vancomycin 1000 mg IV every 8 hours.   Cefepime 2 grams IV every 8 hours Monitor labs, c/s, and vanco levels as indicated  Height: 5\' 6"  (167.6 cm) Weight: 245 lb 13 oz (111.5 kg) IBW/kg (Calculated) : 59.3  Temp (24hrs), Avg:99.8 F (37.7 C), Min:98.5 F (36.9 C), Max:101.5 F (38.6 C)  Recent Labs  Lab 11/07/18 1616 11/07/18 1745 11/08/18 0418 11/09/18 0427 11/09/18 1307 11/09/18 1720  WBC 26.4*  --  26.1* 14.8*  --   --   CREATININE 0.63  --  0.54 0.48 0.52  --   LATICACIDVEN 1.7 1.6  --   --   --   --   VANCOTROUGH  --   --   --   --   --  17  VANCOPEAK  --   --   --   --  30  --     Estimated Creatinine Clearance: 123.1 mL/min (by C-G formula based on SCr of 0.52 mg/dL).    Allergies  Allergen Reactions  . No Known Allergies      Thank you for allowing pharmacy to be a part of this patient's care.  Ramond Craver, PharmD 11/09/2018 6:39 PM

## 2018-11-10 DIAGNOSIS — A419 Sepsis, unspecified organism: Secondary | ICD-10-CM

## 2018-11-10 DIAGNOSIS — N10 Acute tubulo-interstitial nephritis: Secondary | ICD-10-CM

## 2018-11-10 LAB — COMPREHENSIVE METABOLIC PANEL
ALT: 13 U/L (ref 0–44)
AST: 8 U/L — ABNORMAL LOW (ref 15–41)
Albumin: 2.3 g/dL — ABNORMAL LOW (ref 3.5–5.0)
Alkaline Phosphatase: 111 U/L (ref 38–126)
Anion gap: 5 (ref 5–15)
BUN: 7 mg/dL (ref 6–20)
CO2: 26 mmol/L (ref 22–32)
Calcium: 7.8 mg/dL — ABNORMAL LOW (ref 8.9–10.3)
Chloride: 108 mmol/L (ref 98–111)
Creatinine, Ser: 0.59 mg/dL (ref 0.44–1.00)
GFR calc Af Amer: >60 mL/min (ref >60)
GFR calc non Af Amer: >60 mL/min (ref >60)
Glucose, Bld: 87 mg/dL (ref 70–99)
Potassium: 3.7 mmol/L (ref 3.5–5.1)
Sodium: 139 mmol/L (ref 135–145)
Total Bilirubin: 1 mg/dL (ref 0.3–1.2)
Total Protein: 5.5 g/dL — ABNORMAL LOW (ref 6.5–8.1)

## 2018-11-10 LAB — CBC WITH DIFFERENTIAL/PLATELET
Abs Immature Granulocytes: 0.04 10*3/uL (ref 0.00–0.07)
Basophils Absolute: 0 10*3/uL (ref 0.0–0.1)
Basophils Relative: 0 %
Eosinophils Absolute: 0.4 10*3/uL (ref 0.0–0.5)
Eosinophils Relative: 4 %
HCT: 30.8 % — ABNORMAL LOW (ref 36.0–46.0)
Hemoglobin: 9.2 g/dL — ABNORMAL LOW (ref 12.0–15.0)
Immature Granulocytes: 0 %
Lymphocytes Relative: 23 %
Lymphs Abs: 2.4 10*3/uL (ref 0.7–4.0)
MCH: 27.1 pg (ref 26.0–34.0)
MCHC: 29.9 g/dL — ABNORMAL LOW (ref 30.0–36.0)
MCV: 90.9 fL (ref 80.0–100.0)
Monocytes Absolute: 1 10*3/uL (ref 0.1–1.0)
Monocytes Relative: 10 %
Neutro Abs: 6.4 10*3/uL (ref 1.7–7.7)
Neutrophils Relative %: 63 %
Platelets: 349 10*3/uL (ref 150–400)
RBC: 3.39 MIL/uL — ABNORMAL LOW (ref 3.87–5.11)
RDW: 17 % — ABNORMAL HIGH (ref 11.5–15.5)
WBC: 10.3 10*3/uL (ref 4.0–10.5)
nRBC: 0 % (ref 0.0–0.2)

## 2018-11-10 LAB — MAGNESIUM: Magnesium: 1.9 mg/dL (ref 1.7–2.4)

## 2018-11-10 LAB — SEDIMENTATION RATE: Sed Rate: 84 mm/hr — ABNORMAL HIGH (ref 0–22)

## 2018-11-10 LAB — C-REACTIVE PROTEIN: CRP: 21.5 mg/dL — ABNORMAL HIGH (ref <1.0)

## 2018-11-10 MED ORDER — OXYCODONE HCL 5 MG PO TABS
10.0000 mg | ORAL_TABLET | Freq: Four times a day (QID) | ORAL | Status: DC | PRN
  Administered 2018-11-10 – 2018-11-12 (×7): 10 mg via ORAL
  Filled 2018-11-10 (×8): qty 2
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
## 2018-11-10 NOTE — Progress Notes (Signed)
Patient and patient's mother are giving nursing staff a difficult time related to patient possibly being discharged home tomorrow. Saying that patient has a bad cough and is not up to par to be released. Mother states that the doctor needs to call her and update her on what is going on. Will pass it along in report to night shift RN. Currently patient has no complaints other than a cough and chronic pain. Will continue to monitor.

## 2018-11-11 ENCOUNTER — Inpatient Hospital Stay (HOSPITAL_COMMUNITY): Payer: Medicaid Other

## 2018-11-11 LAB — CBC WITH DIFFERENTIAL/PLATELET
Abs Immature Granulocytes: 0.07 10*3/uL (ref 0.00–0.07)
Basophils Absolute: 0 10*3/uL (ref 0.0–0.1)
Basophils Relative: 0 %
Eosinophils Absolute: 0.5 10*3/uL (ref 0.0–0.5)
Eosinophils Relative: 4 %
HCT: 32.9 % — ABNORMAL LOW (ref 36.0–46.0)
Hemoglobin: 10.1 g/dL — ABNORMAL LOW (ref 12.0–15.0)
Immature Granulocytes: 1 %
Lymphocytes Relative: 16 %
Lymphs Abs: 2 10*3/uL (ref 0.7–4.0)
MCH: 27.2 pg (ref 26.0–34.0)
MCHC: 30.7 g/dL (ref 30.0–36.0)
MCV: 88.7 fL (ref 80.0–100.0)
Monocytes Absolute: 1.1 10*3/uL — ABNORMAL HIGH (ref 0.1–1.0)
Monocytes Relative: 9 %
Neutro Abs: 8.9 10*3/uL — ABNORMAL HIGH (ref 1.7–7.7)
Neutrophils Relative %: 70 %
Platelets: 434 10*3/uL — ABNORMAL HIGH (ref 150–400)
RBC: 3.71 MIL/uL — ABNORMAL LOW (ref 3.87–5.11)
RDW: 17 % — ABNORMAL HIGH (ref 11.5–15.5)
WBC: 12.6 10*3/uL — ABNORMAL HIGH (ref 4.0–10.5)
nRBC: 0 % (ref 0.0–0.2)

## 2018-11-11 LAB — COMPREHENSIVE METABOLIC PANEL
ALT: 13 U/L (ref 0–44)
AST: 16 U/L (ref 15–41)
Albumin: 2.4 g/dL — ABNORMAL LOW (ref 3.5–5.0)
Alkaline Phosphatase: 122 U/L (ref 38–126)
Anion gap: 8 (ref 5–15)
BUN: 8 mg/dL (ref 6–20)
CO2: 25 mmol/L (ref 22–32)
Calcium: 8.1 mg/dL — ABNORMAL LOW (ref 8.9–10.3)
Chloride: 106 mmol/L (ref 98–111)
Creatinine, Ser: 0.55 mg/dL (ref 0.44–1.00)
GFR calc Af Amer: >60 mL/min (ref >60)
GFR calc non Af Amer: >60 mL/min (ref >60)
Glucose, Bld: 89 mg/dL (ref 70–99)
Potassium: 2.9 mmol/L — ABNORMAL LOW (ref 3.5–5.1)
Sodium: 139 mmol/L (ref 135–145)
Total Bilirubin: 0.5 mg/dL (ref 0.3–1.2)
Total Protein: 6 g/dL — ABNORMAL LOW (ref 6.5–8.1)

## 2018-11-11 LAB — MAGNESIUM: Magnesium: 1.7 mg/dL (ref 1.7–2.4)

## 2018-11-11 LAB — PROCALCITONIN: Procalcitonin: 0.21 ng/mL

## 2018-11-11 MED ORDER — MAGNESIUM SULFATE 2 GM/50ML IV SOLN
2.0000 g | Freq: Once | INTRAVENOUS | Status: AC
  Administered 2018-11-11: 2 g via INTRAVENOUS
  Filled 2018-11-11: qty 50

## 2018-11-11 MED ORDER — POTASSIUM CHLORIDE CRYS ER 20 MEQ PO TBCR
40.0000 meq | EXTENDED_RELEASE_TABLET | Freq: Once | ORAL | Status: AC
  Administered 2018-11-11: 40 meq via ORAL
  Filled 2018-11-11: qty 2

## 2018-11-11 MED ORDER — OXYCODONE HCL 5 MG PO TABS
10.0000 mg | ORAL_TABLET | Freq: Once | ORAL | Status: AC
  Administered 2018-11-11: 10 mg via ORAL
  Filled 2018-11-11: qty 2
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
## 2018-11-12 LAB — COMPREHENSIVE METABOLIC PANEL
ALT: 18 U/L (ref 0–44)
AST: 23 U/L (ref 15–41)
Albumin: 2.5 g/dL — ABNORMAL LOW (ref 3.5–5.0)
Alkaline Phosphatase: 121 U/L (ref 38–126)
Anion gap: 9 (ref 5–15)
BUN: 8 mg/dL (ref 6–20)
CO2: 26 mmol/L (ref 22–32)
Calcium: 8 mg/dL — ABNORMAL LOW (ref 8.9–10.3)
Chloride: 105 mmol/L (ref 98–111)
Creatinine, Ser: 0.43 mg/dL — ABNORMAL LOW (ref 0.44–1.00)
GFR calc Af Amer: >60 mL/min (ref >60)
GFR calc non Af Amer: >60 mL/min (ref >60)
Glucose, Bld: 89 mg/dL (ref 70–99)
Potassium: 3.3 mmol/L — ABNORMAL LOW (ref 3.5–5.1)
Sodium: 140 mmol/L (ref 135–145)
Total Bilirubin: 0.3 mg/dL (ref 0.3–1.2)
Total Protein: 6.1 g/dL — ABNORMAL LOW (ref 6.5–8.1)

## 2018-11-12 LAB — CBC
HCT: 32 % — ABNORMAL LOW (ref 36.0–46.0)
Hemoglobin: 9.9 g/dL — ABNORMAL LOW (ref 12.0–15.0)
MCH: 27 pg (ref 26.0–34.0)
MCHC: 30.9 g/dL (ref 30.0–36.0)
MCV: 87.2 fL (ref 80.0–100.0)
Platelets: 453 10*3/uL — ABNORMAL HIGH (ref 150–400)
RBC: 3.67 MIL/uL — ABNORMAL LOW (ref 3.87–5.11)
RDW: 17.1 % — ABNORMAL HIGH (ref 11.5–15.5)
WBC: 10.5 10*3/uL (ref 4.0–10.5)
nRBC: 0 % (ref 0.0–0.2)

## 2018-11-12 LAB — CULTURE, BLOOD (ROUTINE X 2)
Culture: NO GROWTH
Culture: NO GROWTH
Special Requests: ADEQUATE
Special Requests: ADEQUATE

## 2018-11-12 LAB — MAGNESIUM: Magnesium: 2 mg/dL (ref 1.7–2.4)

## 2018-11-12 MED ORDER — AMOXICILLIN-POT CLAVULANATE 875-125 MG PO TABS: 1.0000 | ORAL_TABLET | Freq: Two times a day (BID) | ORAL | 0 refills | Status: DC

## 2018-11-12 MED ORDER — AMOXICILLIN-POT CLAVULANATE 875-125 MG PO TABS
1.0000 | ORAL_TABLET | Freq: Two times a day (BID) | ORAL | Status: DC
  Administered 2018-11-12: 1 via ORAL
  Filled 2018-11-12: qty 1

## 2018-11-12 MED ORDER — POTASSIUM CHLORIDE CRYS ER 20 MEQ PO TBCR
40.0000 meq | EXTENDED_RELEASE_TABLET | Freq: Once | ORAL | Status: AC
  Administered 2018-11-12: 40 meq via ORAL
  Filled 2018-11-12: qty 2

## 2018-11-12 MED ORDER — POTASSIUM CHLORIDE CRYS ER 20 MEQ PO TBCR: 20.0000 meq | EXTENDED_RELEASE_TABLET | Freq: Every day | ORAL | 0 refills | Status: DC
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
## 2018-11-12 NOTE — TOC Transition Note (Signed)
Transition of Care [facility]) - CM/SW Discharge Note   Patient Details  Name: [name] MRN: [id number] Date of Birth: 1980-07-28  Transition of Care [facility]) CM/SW Contact:  Ihor Gully, LCSW Phone Number: 11/12/2018, 11:33 AM   Clinical Narrative:    Vaughan Basta at [facility] notified of d/c. Attending to place [facility] RN orders. PCP office closes at noon and after remaining on hold for an extensive no PCP follow up was able to be made.      Barriers to Discharge: Continued Medical Work up   Patient Goals and CMS Choice Patient states their goals for this hospitalization and ongoing recovery are:: to return home.      Discharge Placement                       Discharge Plan and Services                                     Social Determinants of Health (SDOH) Interventions     Readmission Risk Interventions Readmission Risk Prevention Plan 11/08/2018  Transportation Screening Complete  PCP or Specialist Appt within 3-5 Days Not Complete  HRI or [place] Complete  Social Work Consult for [place] Planning/Counseling Complete  Palliative Care Screening Not Complete  Medication Review [employer] photographer) Complete  Some recent data might be hidden

## 2018-11-26 ENCOUNTER — Encounter (HOSPITAL_BASED_OUTPATIENT_CLINIC_OR_DEPARTMENT_OTHER): Payer: Medicaid Other | Attending: Internal Medicine

## 2018-11-26 DIAGNOSIS — L89154 Pressure ulcer of sacral region, stage 4: Secondary | ICD-10-CM | POA: Insufficient documentation

## 2018-11-26 DIAGNOSIS — B9562 Methicillin resistant Staphylococcus aureus infection as the cause of diseases classified elsewhere: Secondary | ICD-10-CM | POA: Diagnosis not present

## 2018-11-29 ENCOUNTER — Encounter (HOSPITAL_BASED_OUTPATIENT_CLINIC_OR_DEPARTMENT_OTHER): Payer: Medicaid Other

## 2018-12-08 ENCOUNTER — Other Ambulatory Visit: Payer: Self-pay | Admitting: Anesthesiology
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
## 2018-12-21 ENCOUNTER — Other Ambulatory Visit: Payer: Self-pay

## 2018-12-21 ENCOUNTER — Other Ambulatory Visit (HOSPITAL_COMMUNITY)
Admission: RE | Admit: 2018-12-21 | Discharge: 2018-12-21 | Disposition: A | Discharge status: Home / Self Care | Payer: Medicaid Other | Source: Ambulatory Visit | Attending: Anesthesiology | Admitting: Anesthesiology

## 2018-12-21 ENCOUNTER — Encounter (HOSPITAL_COMMUNITY): Payer: Self-pay | Admitting: *Deleted

## 2018-12-21 DIAGNOSIS — Z01812 Encounter for preprocedural laboratory examination: Secondary | ICD-10-CM | POA: Diagnosis present

## 2018-12-21 DIAGNOSIS — Z20828 Contact with and (suspected) exposure to other viral communicable diseases: Secondary | ICD-10-CM | POA: Diagnosis not present

## 2018-12-21 LAB — SARS CORONAVIRUS 2 (TAT 6-24 HRS): SARS Coronavirus 2: NEGATIVE

## 2018-12-21 MED ORDER — DEXTROSE 5 % IV SOLN
3.0000 g | INTRAVENOUS | Status: AC
  Administered 2018-12-22: 3 g via INTRAVENOUS
  Filled 2018-12-21: qty 3

## 2018-12-21 NOTE — Progress Notes (Signed)
Pt requested that her mother, Arville Go, complete SDW-pre-op call. Mother denies that pt C/O SOB and chest pain. Mother denies that pt is under the care of a cardiologist. Mother stated that PCP is Dr. Lorra Hals ( [facility] Everywhere). Mother denies that pt had a stress test, echo and cardiac cath. Mother denies that pt had an EKG in the last year. Mother denies recent labs. Mother made aware to have pt stop taking Aspirin (unless otherwise advised by surgeon), vitamins, fish oil and herbal medications. Do not take any NSAIDs ie: Ibuprofen, Advil, Naproxen (Aleve), Motrin, BC and Goody Powder.  Mother verbalized understanding of all pre-op instructions. PA, Anesthesiology, asked to review pt history ( recent admission/ Chest CT).
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
## 2018-12-22 ENCOUNTER — Ambulatory Visit (HOSPITAL_COMMUNITY)
Admission: RE | Admit: 2018-12-22 | Discharge: 2018-12-22 | Disposition: A | Discharge status: Home / Self Care | Payer: Medicaid Other | Attending: Anesthesiology | Admitting: Anesthesiology

## 2018-12-22 ENCOUNTER — Ambulatory Visit (HOSPITAL_COMMUNITY): Payer: Medicaid Other

## 2018-12-22 ENCOUNTER — Ambulatory Visit (HOSPITAL_COMMUNITY): Payer: Medicaid Other | Admitting: Vascular Surgery

## 2018-12-22 ENCOUNTER — Encounter (HOSPITAL_COMMUNITY): Payer: Self-pay

## 2018-12-22 ENCOUNTER — Encounter (HOSPITAL_COMMUNITY)
Admission: RE | Disposition: A | Discharge status: Home / Self Care | Payer: Self-pay | Source: Home / Self Care | Attending: Anesthesiology

## 2018-12-22 DIAGNOSIS — Z79899 Other long term (current) drug therapy: Secondary | ICD-10-CM | POA: Diagnosis not present

## 2018-12-22 DIAGNOSIS — E039 Hypothyroidism, unspecified: Secondary | ICD-10-CM | POA: Diagnosis not present

## 2018-12-22 DIAGNOSIS — D509 Iron deficiency anemia, unspecified: Secondary | ICD-10-CM | POA: Diagnosis not present

## 2018-12-22 DIAGNOSIS — F17210 Nicotine dependence, cigarettes, uncomplicated: Secondary | ICD-10-CM | POA: Insufficient documentation

## 2018-12-22 DIAGNOSIS — J90 Pleural effusion, not elsewhere classified: Secondary | ICD-10-CM | POA: Diagnosis not present

## 2018-12-22 DIAGNOSIS — Z9049 Acquired absence of other specified parts of digestive tract: Secondary | ICD-10-CM | POA: Insufficient documentation

## 2018-12-22 DIAGNOSIS — Z6838 Body mass index (BMI) 38.0-38.9, adult: Secondary | ICD-10-CM | POA: Insufficient documentation

## 2018-12-22 DIAGNOSIS — M62838 Other muscle spasm: Secondary | ICD-10-CM | POA: Insufficient documentation

## 2018-12-22 DIAGNOSIS — E871 Hypo-osmolality and hyponatremia: Secondary | ICD-10-CM | POA: Diagnosis not present

## 2018-12-22 DIAGNOSIS — G8220 Paraplegia, unspecified: Secondary | ICD-10-CM | POA: Insufficient documentation

## 2018-12-22 DIAGNOSIS — Z981 Arthrodesis status: Secondary | ICD-10-CM | POA: Diagnosis not present

## 2018-12-22 DIAGNOSIS — Z8249 Family history of ischemic heart disease and other diseases of the circulatory system: Secondary | ICD-10-CM | POA: Diagnosis not present

## 2018-12-22 DIAGNOSIS — F419 Anxiety disorder, unspecified: Secondary | ICD-10-CM | POA: Diagnosis not present

## 2018-12-22 DIAGNOSIS — F329 Major depressive disorder, single episode, unspecified: Secondary | ICD-10-CM | POA: Insufficient documentation

## 2018-12-22 DIAGNOSIS — M1990 Unspecified osteoarthritis, unspecified site: Secondary | ICD-10-CM | POA: Insufficient documentation

## 2018-12-22 DIAGNOSIS — K219 Gastro-esophageal reflux disease without esophagitis: Secondary | ICD-10-CM | POA: Diagnosis not present

## 2018-12-22 HISTORY — DX: Other muscle spasm: M62.838

## 2018-12-22 HISTORY — DX: Pneumonia, unspecified organism: J18.9

## 2018-12-22 HISTORY — DX: Presence of spectacles and contact lenses: Z97.3

## 2018-12-22 HISTORY — PX: PAIN PUMP IMPLANTATION: SHX330

## 2018-12-22 HISTORY — DX: Unspecified osteoarthritis, unspecified site: M19.90

## 2018-12-22 HISTORY — DX: Sepsis, unspecified organism: A41.9

## 2018-12-22 HISTORY — DX: Dental caries, unspecified: K02.9

## 2018-12-22 LAB — BASIC METABOLIC PANEL
Anion gap: 7 (ref 5–15)
BUN: 11 mg/dL (ref 6–20)
CO2: 26 mmol/L (ref 22–32)
Calcium: 8.8 mg/dL — ABNORMAL LOW (ref 8.9–10.3)
Chloride: 102 mmol/L (ref 98–111)
Creatinine, Ser: 0.55 mg/dL (ref 0.44–1.00)
GFR calc Af Amer: >60 mL/min (ref >60)
GFR calc non Af Amer: >60 mL/min (ref >60)
Glucose, Bld: 88 mg/dL (ref 70–99)
Potassium: 3.7 mmol/L (ref 3.5–5.1)
Sodium: 135 mmol/L (ref 135–145)

## 2018-12-22 LAB — POCT PREGNANCY, URINE: Preg Test, Ur: NEGATIVE

## 2018-12-22 LAB — CBC
HCT: 41.9 % (ref 36.0–46.0)
Hemoglobin: 13.5 g/dL (ref 12.0–15.0)
MCH: 28.5 pg (ref 26.0–34.0)
MCHC: 32.2 g/dL (ref 30.0–36.0)
MCV: 88.4 fL (ref 80.0–100.0)
Platelets: 560 10*3/uL — ABNORMAL HIGH (ref 150–400)
RBC: 4.74 MIL/uL (ref 3.87–5.11)
RDW: 15.3 % (ref 11.5–15.5)
WBC: 12.8 10*3/uL — ABNORMAL HIGH (ref 4.0–10.5)
nRBC: 0 % (ref 0.0–0.2)

## 2018-12-22 SURGERY — PAIN PUMP INSERTION
Anesthesia: General | Site: Flank

## 2018-12-22 MED ORDER — FENTANYL CITRATE (PF) 100 MCG/2ML IJ SOLN
INTRAMUSCULAR | Status: AC
  Filled 2018-12-22: qty 2

## 2018-12-22 MED ORDER — PHENYLEPHRINE 40 MCG/ML (10ML) SYRINGE FOR IV PUSH (FOR BLOOD PRESSURE SUPPORT)
PREFILLED_SYRINGE | INTRAVENOUS | Status: DC | PRN
  Administered 2018-12-22 (×4): 80 ug via INTRAVENOUS

## 2018-12-22 MED ORDER — BACLOFEN 10 MG/20ML IT SOLN
20.0000 mg | Freq: Once | INTRATHECAL | Status: DC
  Filled 2018-12-22: qty 40

## 2018-12-22 MED ORDER — BUPIVACAINE-EPINEPHRINE (PF) 0.5% -1:200000 IJ SOLN
INTRAMUSCULAR | Status: AC
  Filled 2018-12-22: qty 30

## 2018-12-22 MED ORDER — PHENYLEPHRINE 40 MCG/ML (10ML) SYRINGE FOR IV PUSH (FOR BLOOD PRESSURE SUPPORT)
PREFILLED_SYRINGE | INTRAVENOUS | Status: AC
  Filled 2018-12-22: qty 20

## 2018-12-22 MED ORDER — SCOPOLAMINE 1 MG/3DAYS TD PT72
MEDICATED_PATCH | TRANSDERMAL | Status: AC
  Filled 2018-12-22: qty 1

## 2018-12-22 MED ORDER — ALBUTEROL SULFATE HFA 108 (90 BASE) MCG/ACT IN AERS
INHALATION_SPRAY | RESPIRATORY_TRACT | Status: DC | PRN
  Administered 2018-12-22: 6 via RESPIRATORY_TRACT

## 2018-12-22 MED ORDER — BUPIVACAINE-EPINEPHRINE 0.5% -1:200000 IJ SOLN
INTRAMUSCULAR | Status: DC | PRN
  Administered 2018-12-22: 10 mL

## 2018-12-22 MED ORDER — MIDAZOLAM HCL 2 MG/2ML IJ SOLN
INTRAMUSCULAR | Status: DC | PRN
  Administered 2018-12-22: 2 mg via INTRAVENOUS

## 2018-12-22 MED ORDER — SODIUM CHLORIDE 0.9 % IV SOLN
INTRAVENOUS | Status: DC | PRN
  Administered 2018-12-22: 14:00:00

## 2018-12-22 MED ORDER — FENTANYL CITRATE (PF) 250 MCG/5ML IJ SOLN
INTRAMUSCULAR | Status: AC
  Filled 2018-12-22: qty 5

## 2018-12-22 MED ORDER — OXYCODONE HCL 10 MG PO TABS: 10.0000 mg | ORAL_TABLET | ORAL | 0 refills | Status: AC | PRN

## 2018-12-22 MED ORDER — FENTANYL CITRATE (PF) 100 MCG/2ML IJ SOLN
25.0000 ug | INTRAMUSCULAR | Status: DC | PRN
  Administered 2018-12-22: 50 ug via INTRAVENOUS
  Administered 2018-12-22: 25 ug via INTRAVENOUS
  Administered 2018-12-22: 16:00:00 50 ug via INTRAVENOUS
  Administered 2018-12-22: 25 ug via INTRAVENOUS

## 2018-12-22 MED ORDER — CHLORHEXIDINE GLUCONATE CLOTH 2 % EX PADS: 6.0000 | MEDICATED_PAD | Freq: Once | CUTANEOUS | Status: DC

## 2018-12-22 MED ORDER — ROCURONIUM BROMIDE 10 MG/ML (PF) SYRINGE
PREFILLED_SYRINGE | INTRAVENOUS | Status: AC
  Filled 2018-12-22: qty 10

## 2018-12-22 MED ORDER — LACTATED RINGERS IV SOLN
INTRAVENOUS | Status: DC
  Administered 2018-12-22 (×2): via INTRAVENOUS

## 2018-12-22 MED ORDER — LIDOCAINE 2% (20 MG/ML) 5 ML SYRINGE
INTRAMUSCULAR | Status: DC | PRN
  Administered 2018-12-22: 40 mg via INTRAVENOUS

## 2018-12-22 MED ORDER — ACETAMINOPHEN 500 MG PO TABS: 1000.0000 mg | ORAL_TABLET | Freq: Once | ORAL | Status: DC | PRN

## 2018-12-22 MED ORDER — ACETAMINOPHEN 10 MG/ML IV SOLN: 1000.0000 mg | Freq: Once | INTRAVENOUS | Status: DC | PRN

## 2018-12-22 MED ORDER — ONDANSETRON HCL 4 MG/2ML IJ SOLN
INTRAMUSCULAR | Status: DC | PRN
  Administered 2018-12-22: 4 mg via INTRAVENOUS

## 2018-12-22 MED ORDER — PROPOFOL 10 MG/ML IV BOLUS
INTRAVENOUS | Status: DC | PRN
  Administered 2018-12-22: 150 mg via INTRAVENOUS
  Administered 2018-12-22: 30 mg via INTRAVENOUS

## 2018-12-22 MED ORDER — MIDAZOLAM HCL 2 MG/2ML IJ SOLN
INTRAMUSCULAR | Status: AC
  Filled 2018-12-22: qty 2

## 2018-12-22 MED ORDER — OXYCODONE HCL 5 MG/5ML PO SOLN: 5.0000 mg | Freq: Once | ORAL | Status: AC | PRN

## 2018-12-22 MED ORDER — ONDANSETRON HCL 4 MG/2ML IJ SOLN
INTRAMUSCULAR | Status: AC
  Filled 2018-12-22: qty 2

## 2018-12-22 MED ORDER — LIDOCAINE 2% (20 MG/ML) 5 ML SYRINGE
INTRAMUSCULAR | Status: AC
  Filled 2018-12-22: qty 5

## 2018-12-22 MED ORDER — ROCURONIUM BROMIDE 10 MG/ML (PF) SYRINGE
PREFILLED_SYRINGE | INTRAVENOUS | Status: DC | PRN
  Administered 2018-12-22: 70 mg via INTRAVENOUS

## 2018-12-22 MED ORDER — SCOPOLAMINE 1 MG/3DAYS TD PT72
MEDICATED_PATCH | TRANSDERMAL | Status: DC | PRN
  Administered 2018-12-22: 1 via TRANSDERMAL

## 2018-12-22 MED ORDER — ALBUTEROL SULFATE HFA 108 (90 BASE) MCG/ACT IN AERS
INHALATION_SPRAY | RESPIRATORY_TRACT | Status: AC
  Filled 2018-12-22: qty 6.7

## 2018-12-22 MED ORDER — 0.9 % SODIUM CHLORIDE (POUR BTL) OPTIME
TOPICAL | Status: DC | PRN
  Administered 2018-12-22: 1000 mL

## 2018-12-22 MED ORDER — SUGAMMADEX SODIUM 200 MG/2ML IV SOLN
INTRAVENOUS | Status: DC | PRN
  Administered 2018-12-22: 200 mg via INTRAVENOUS

## 2018-12-22 MED ORDER — DEXAMETHASONE SODIUM PHOSPHATE 10 MG/ML IJ SOLN
INTRAMUSCULAR | Status: DC | PRN
  Administered 2018-12-22: 5 mg via INTRAVENOUS

## 2018-12-22 MED ORDER — OXYCODONE HCL 5 MG PO TABS
5.0000 mg | ORAL_TABLET | Freq: Once | ORAL | Status: AC | PRN
  Administered 2018-12-22: 16:00:00 5 mg via ORAL

## 2018-12-22 MED ORDER — ACETAMINOPHEN 160 MG/5ML PO SOLN: 1000.0000 mg | Freq: Once | ORAL | Status: DC | PRN

## 2018-12-22 MED ORDER — FENTANYL CITRATE (PF) 250 MCG/5ML IJ SOLN
INTRAMUSCULAR | Status: DC | PRN
  Administered 2018-12-22: 50 ug via INTRAVENOUS
  Administered 2018-12-22: 150 ug via INTRAVENOUS
  Administered 2018-12-22: 50 ug via INTRAVENOUS

## 2018-12-22 MED ORDER — OXYCODONE HCL 5 MG PO TABS
ORAL_TABLET | ORAL | Status: AC
  Filled 2018-12-22: qty 1

## 2018-12-22 MED ORDER — DEXAMETHASONE SODIUM PHOSPHATE 10 MG/ML IJ SOLN
INTRAMUSCULAR | Status: AC
  Filled 2018-12-22: qty 1

## 2018-12-22 SURGICAL SUPPLY — 65 items
BAG DECANTER FOR FLEXI CONT (MISCELLANEOUS) ×2 IMPLANT
BINDER ABDOMINAL 12 ML 46-62 (SOFTGOODS) ×2 IMPLANT
BOOT SUTURE AID YELLOW STND (SUTURE) ×2 IMPLANT
CATH ASCENDA 1PIECE (Catheter) ×1 IMPLANT
CHLORAPREP W/TINT 26 (MISCELLANEOUS) ×2 IMPLANT
DERMABOND ADVANCED (GAUZE/BANDAGES/DRESSINGS) ×2
DERMABOND ADVANCED .7 DNX12 (GAUZE/BANDAGES/DRESSINGS) IMPLANT
DRAPE C-ARMOR (DRAPES) ×2 IMPLANT
DRAPE INCISE IOBAN 66X45 STRL (DRAPES) IMPLANT
DRAPE LAPAROTOMY T 102X78X121 (DRAPES) IMPLANT
DRAPE ORTHO SPLIT 77X108 STRL (DRAPES) ×2
DRAPE SURG 17X23 STRL (DRAPES) ×8 IMPLANT
DRAPE SURG ORHT 6 SPLT 77X108 (DRAPES) ×2 IMPLANT
DRSG OPSITE POSTOP 4X6 (GAUZE/BANDAGES/DRESSINGS) ×1 IMPLANT
DRSG OPSITE POSTOP 4X8 (GAUZE/BANDAGES/DRESSINGS) ×1 IMPLANT
ELECT REM PT RETURN 9FT ADLT (ELECTROSURGICAL) ×2
ELECTRODE REM PT RTRN 9FT ADLT (ELECTROSURGICAL) ×1 IMPLANT
GAUZE 4X4 16PLY RFD (DISPOSABLE) IMPLANT
GLOVE BIOGEL PI IND STRL 6.5 (GLOVE) IMPLANT
GLOVE BIOGEL PI IND STRL 7.0 (GLOVE) IMPLANT
GLOVE BIOGEL PI IND STRL 7.5 (GLOVE) ×1 IMPLANT
GLOVE BIOGEL PI INDICATOR 6.5 (GLOVE) ×2
GLOVE BIOGEL PI INDICATOR 7.0 (GLOVE) ×2
GLOVE BIOGEL PI INDICATOR 7.5 (GLOVE) ×2
GLOVE ECLIPSE 7.5 STRL STRAW (GLOVE) ×2 IMPLANT
GLOVE SS N UNI LF 6.0 STRL (GLOVE) ×2 IMPLANT
GLOVE SS N UNI LF 6.5 STRL (GLOVE) ×2 IMPLANT
GLOVE SS N UNI LF 7.0 STRL (GLOVE) ×2 IMPLANT
GOWN STRL REUS W/ TWL LRG LVL3 (GOWN DISPOSABLE) IMPLANT
GOWN STRL REUS W/ TWL XL LVL3 (GOWN DISPOSABLE) IMPLANT
GOWN STRL REUS W/TWL 2XL LVL3 (GOWN DISPOSABLE) IMPLANT
GOWN STRL REUS W/TWL LRG LVL3 (GOWN DISPOSABLE) ×2
GOWN STRL REUS W/TWL XL LVL3 (GOWN DISPOSABLE)
KIT BASIN OR (CUSTOM PROCEDURE TRAY) ×4 IMPLANT
KIT REFILL (MISCELLANEOUS) ×1
KIT REFILL CATH SYNCHROMED II (MISCELLANEOUS) IMPLANT
KIT TURNOVER KIT B (KITS) ×2 IMPLANT
NDL HYPO 18GX1.5 BLUNT FILL (NEEDLE) ×1 IMPLANT
NDL HYPO 25X1 1.5 SAFETY (NEEDLE) ×1 IMPLANT
NDL SPNL 3.6 NEURO (NEEDLE) IMPLANT
NEEDLE HYPO 18GX1.5 BLUNT FILL (NEEDLE) ×2 IMPLANT
NEEDLE HYPO 25X1 1.5 SAFETY (NEEDLE) ×2 IMPLANT
NEEDLE SPNL 3.6 NEURO (NEEDLE) ×2 IMPLANT
NS IRRIG 1000ML POUR BTL (IV SOLUTION) ×2 IMPLANT
PACK LAMINECTOMY NEURO (CUSTOM PROCEDURE TRAY) ×2 IMPLANT
PAD ARMBOARD 7.5X6 YLW CONV (MISCELLANEOUS) ×2 IMPLANT
POUCH TYRX ANTIBAC NEURO LRG (Mesh General) IMPLANT
POUCH TYRX NEURO LRG (Mesh General) ×2 IMPLANT
PUMP SYNCHROMED II 40ML RESVR (Neuro Prosthesis/Implant) ×1 IMPLANT
SPONGE LAP 4X18 RFD (DISPOSABLE) IMPLANT
STAPLER SKIN PROX WIDE 3.9 (STAPLE) ×2 IMPLANT
SUT MNCRL AB 3-0 PS2 27 (SUTURE) ×2 IMPLANT
SUT MNCRL AB 4-0 PS2 18 (SUTURE) ×2 IMPLANT
SUT SILK 0 TIES 10X30 (SUTURE) IMPLANT
SUT SILK 2 0 TIES 10X30 (SUTURE) IMPLANT
SUT VIC AB 1 CT1 18XBRD ANBCTR (SUTURE) IMPLANT
SUT VIC AB 1 CT1 8-18 (SUTURE) ×1
SUT VIC AB 2-0 CP2 18 (SUTURE) ×4 IMPLANT
SYR 10ML LL (SYRINGE) IMPLANT
SYR 20ML LL LF (SYRINGE) ×2 IMPLANT
SYR 3ML LL SCALE MARK (SYRINGE) ×2 IMPLANT
TOWEL GREEN STERILE (TOWEL DISPOSABLE) ×2 IMPLANT
TOWEL GREEN STERILE FF (TOWEL DISPOSABLE) ×2 IMPLANT
WATER STERILE IRR 1000ML POUR (IV SOLUTION) ×2 IMPLANT
YANKAUER SUCT BULB TIP NO VENT (SUCTIONS) ×2 IMPLANT

## 2018-12-22 NOTE — Anesthesia Procedure Notes (Signed)
Procedure Name: Intubation Date/Time: 12/22/2018 12:52 PM Performed by: Alain Marion, CRNA Pre-anesthesia Checklist: Patient identified, Emergency Drugs available, Suction available and Patient being monitored Patient Re-evaluated:Patient Re-evaluated prior to induction Oxygen Delivery Method: Circle System Utilized Preoxygenation: Pre-oxygenation with 100% oxygen Induction Type: IV induction Ventilation: Mask ventilation without difficulty Laryngoscope Size: Mac and 3 Grade View: Grade I Tube type: Oral Tube size: 7.0 mm Number of attempts: 1 Airway Equipment and Method: Stylet Placement Confirmation: ETT inserted through vocal cords under direct vision,  positive ETCO2 and breath sounds checked- equal and bilateral Secured at: 22 cm Tube secured with: Tape Dental Injury: Teeth and Oropharynx as per pre-operative assessment

## 2018-12-22 NOTE — Transfer of Care (Signed)
Immediate Anesthesia Transfer of Care Note  Patient: [name]  Procedure(s) Performed: INTRATHECAL BACLOFEN PUMP IMPLANT (N/A Flank)  Patient Location: PACU  Anesthesia Type:General  Level of Consciousness: awake, alert  and oriented  Airway & Oxygen Therapy: Patient Spontanous Breathing and Patient connected to face mask oxygen  Post-op Assessment: Report given to RN and Post -op Vital signs reviewed and stable  Post vital signs: Reviewed and stable  Last Vitals:  Vitals Value Taken Time  BP 107/72 12/22/18 1540  Temp    Pulse 93 12/22/18 1547  Resp 17 12/22/18 1547  SpO2 98 % 12/22/18 1547  Vitals shown include unvalidated device data.  Last Pain:  Vitals:   12/22/18 1141  PainSc: 7       Patients Stated Pain Goal: 2 ([id number])  Complications: No apparent anesthesia complications

## 2018-12-22 NOTE — Op Note (Signed)
PREOP DX: 1)Spinal cord injury 2)  spasticity POSTOP [id number] as preop PROCEDURES: 1) implantation of IT catheter-- aborted 2) implantation of 40 mL IT pump with baclofen 500 mcg/mL  SURGEON: Avery Klingbeil ASSISTANT: none ANESTHESIA: GETA  EBL: <50cc  DESCRIPTION OF PROCEDURE: After a discussion of risks, benefits and alternatives, informed consent was obtained. The patient was taken to the OR, a plane of general anesthesia induced, and the patient intubated by the anesthesiology staff. The patient was then positioned in a right lateral decubitus position on the OR table, an axillary roll placed, and extreme care taken to position the patient so that all pressure points were padded, limbs and head all in a neutral position. The patient's abdomen, flank and lumbar spine were prepped with chloraprep, and draped into a sterile field. A timeout was taken to verify the appropriate patient, position, procedure, availability of equipment and personnel, and administration of perioperative antibiotics.   A lumbar incision was planned at the L4-5level with the use of the fluoroscope. The skin was incised with a 10 blade, and careful sharp dissection carried down to the dorsolumbar fascia with the bovie electrocautery. A 14-gauge Touhy needle was then introduced and advanced under fluoroscopic guidance with a gradual approach to the L3-4 interspace. Clear CSF was obtained, And attempts made to thread the catheter, with unfortunately the catheter either being turned left, or reflected so that it turned and ran caudad.  Similar attempts were made at the L2-3 and L1-2 level, with again good flow of CSF obtained but inability to thread a catheter cephalad.The incision was incrementally extended and sequential attempts using biplanar fluoroscopy were then made at the T12-L1 level and at this level we were able to thread the catheter, but it seemed to migrate anteriorly and no CSF flow could be obtained from the  catheter itself.  We thus withdrew the catheter, and made a single attempt at the T11 12 level, with a very shallow approach.  Unfortunately, between the patient's habitus and required angle, we were unable to access the level.  For safety, I elected at that point to abort the procedure, and return at a later date with the assistance of a neurosurgeon who might be able to advanced our exposure, or provide a laminotomy if needed etcetera..     The incision was then copiously irrigated with bacitracin-containing irrigation, and the incision closed with 2 layers of 1-0 interrupted vicryl sutures, and the skin closed with   A 3-0 Monocryl subcuticular stitch and Dermabond.  A sterile dressing was applied..    At the juncture where we were able to obtain good CSF flow, thought will be able to pass a catheter, up pump had been opened, flushed, and prepared with 500 mcg/mL preservative-free  Baclofen.  Rather than waste the pump itself, and the drug within it, we elected to implant the pump as we would have if it had a catheter.  At incision was made in the abdomen, in the lower left quadrant, and using blunt dissection a pocket of adequate size was a obtained.  Hemostasis was obtained after inspection.  The pump was placed in the pocket, with a drug-eluting patch, and then closed with 1-0 Vicryl interrupted sutures, and the skin closed with running 3-0 Monocryl subcuticular suture and Dermabond.  A sterile dressing was applied.  Needle, instrument and sponge counts were correct x2 at the end of the case.    DRAINS: none  COMPLICATIONS:   Inability to pass intrathecal catheter, but no CONDITION:  stable throughout, and to the PACU  DISPOSITION:discharge to home.She will follow up with me in 2 weeks for return to the operating room, with assistance as described above in order to place her catheter. Case discussed with the patient's family.  Have discuss the case further with Dr. Vertell Limber, Dr. Vertell Limber may be  willing to do a laminotomy, cut down, and introduction of a thoracic catheter

## 2018-12-22 NOTE — Progress Notes (Signed)
Spoke with Dr. Maryjean Ka about patient's recent UTI.  Patient states that she finished her antibiotic on the 15th.  No new orders received.

## 2018-12-22 NOTE — Progress Notes (Signed)
Orthopedic Tech Progress Note Patient Details:  [name] 1980/08/27 [id number] PACU RN called requesting ABDOMINAL BINDER Ortho Devices Type of Ortho Device: Abdominal binder Ortho Device/Splint Location: stomach Ortho Device/Splint Interventions: Adjustment, Application, Ordered   Post Interventions Patient Tolerated: Well Instructions Provided: Care of device, Adjustment of device   [name] 12/22/2018, 4:24 PM

## 2018-12-23 ENCOUNTER — Other Ambulatory Visit: Payer: Self-pay | Admitting: Anesthesiology

## 2018-12-23 ENCOUNTER — Encounter (HOSPITAL_COMMUNITY): Payer: Self-pay | Admitting: Anesthesiology

## 2018-12-23 NOTE — Anesthesia Postprocedure Evaluation (Signed)
Anesthesia Post Note  Patient: [name]  Procedure(s) Performed: INTRATHECAL BACLOFEN PUMP IMPLANT (N/A Flank)     Patient location during evaluation: PACU Anesthesia Type: General Level of consciousness: awake and alert Pain management: pain level controlled Vital Signs Assessment: post-procedure vital signs reviewed and stable Respiratory status: spontaneous breathing, nonlabored ventilation, respiratory function stable and patient connected to nasal cannula oxygen Cardiovascular status: blood pressure returned to baseline and stable Postop Assessment: no apparent nausea or vomiting Anesthetic complications: no    Last Vitals:  Vitals:   12/22/18 1700 12/22/18 1706  BP:    Pulse: 85 90  Resp: (!) 9 15  Temp: (!) 36.1 C   SpO2: 96% 95%    Last Pain:  Vitals:   12/22/18 1540  PainSc: 10-Worst pain ever                 Kayleigh Broadwell

## 2018-12-24 ENCOUNTER — Encounter (HOSPITAL_BASED_OUTPATIENT_CLINIC_OR_DEPARTMENT_OTHER): Payer: Medicaid Other | Admitting: Internal Medicine

## 2019-01-02 ENCOUNTER — Emergency Department (HOSPITAL_COMMUNITY): Payer: Medicaid Other | Admitting: Anesthesiology

## 2019-01-02 ENCOUNTER — Other Ambulatory Visit: Payer: Self-pay

## 2019-01-02 ENCOUNTER — Encounter (HOSPITAL_COMMUNITY): Payer: Self-pay

## 2019-01-02 ENCOUNTER — Encounter (HOSPITAL_COMMUNITY)
Admission: EM | Disposition: A | Discharge status: Home / Self Care | Payer: Self-pay | Source: Home / Self Care | Attending: Emergency Medicine

## 2019-01-02 ENCOUNTER — Ambulatory Visit (HOSPITAL_COMMUNITY)
Admission: EM | Admit: 2019-01-02 | Discharge: 2019-01-02 | Disposition: A | Discharge status: Home / Self Care | Payer: Medicaid Other | Attending: Anesthesiology | Admitting: Anesthesiology

## 2019-01-02 DIAGNOSIS — Z1623 Resistance to quinolones and fluoroquinolones: Secondary | ICD-10-CM | POA: Insufficient documentation

## 2019-01-02 DIAGNOSIS — G8929 Other chronic pain: Secondary | ICD-10-CM | POA: Diagnosis not present

## 2019-01-02 DIAGNOSIS — F17210 Nicotine dependence, cigarettes, uncomplicated: Secondary | ICD-10-CM | POA: Insufficient documentation

## 2019-01-02 DIAGNOSIS — M1990 Unspecified osteoarthritis, unspecified site: Secondary | ICD-10-CM | POA: Diagnosis not present

## 2019-01-02 DIAGNOSIS — B9620 Unspecified Escherichia coli [E. coli] as the cause of diseases classified elsewhere: Secondary | ICD-10-CM | POA: Insufficient documentation

## 2019-01-02 DIAGNOSIS — F70 Mild intellectual disabilities: Secondary | ICD-10-CM | POA: Diagnosis not present

## 2019-01-02 DIAGNOSIS — N946 Dysmenorrhea, unspecified: Secondary | ICD-10-CM | POA: Diagnosis not present

## 2019-01-02 DIAGNOSIS — E876 Hypokalemia: Secondary | ICD-10-CM | POA: Insufficient documentation

## 2019-01-02 DIAGNOSIS — N312 Flaccid neuropathic bladder, not elsewhere classified: Secondary | ICD-10-CM | POA: Diagnosis not present

## 2019-01-02 DIAGNOSIS — Z1611 Resistance to penicillins: Secondary | ICD-10-CM | POA: Diagnosis not present

## 2019-01-02 DIAGNOSIS — K219 Gastro-esophageal reflux disease without esophagitis: Secondary | ICD-10-CM | POA: Insufficient documentation

## 2019-01-02 DIAGNOSIS — T8149XA Infection following a procedure, other surgical site, initial encounter: Secondary | ICD-10-CM | POA: Diagnosis present

## 2019-01-02 DIAGNOSIS — Z20828 Contact with and (suspected) exposure to other viral communicable diseases: Secondary | ICD-10-CM | POA: Diagnosis not present

## 2019-01-02 DIAGNOSIS — E039 Hypothyroidism, unspecified: Secondary | ICD-10-CM | POA: Diagnosis not present

## 2019-01-02 DIAGNOSIS — T8141XA Infection following a procedure, superficial incisional surgical site, initial encounter: Secondary | ICD-10-CM | POA: Insufficient documentation

## 2019-01-02 DIAGNOSIS — F419 Anxiety disorder, unspecified: Secondary | ICD-10-CM | POA: Insufficient documentation

## 2019-01-02 DIAGNOSIS — X58XXXA Exposure to other specified factors, initial encounter: Secondary | ICD-10-CM | POA: Insufficient documentation

## 2019-01-02 DIAGNOSIS — E46 Unspecified protein-calorie malnutrition: Secondary | ICD-10-CM | POA: Insufficient documentation

## 2019-01-02 DIAGNOSIS — G8220 Paraplegia, unspecified: Secondary | ICD-10-CM | POA: Diagnosis not present

## 2019-01-02 DIAGNOSIS — Z79899 Other long term (current) drug therapy: Secondary | ICD-10-CM | POA: Insufficient documentation

## 2019-01-02 DIAGNOSIS — E871 Hypo-osmolality and hyponatremia: Secondary | ICD-10-CM | POA: Diagnosis not present

## 2019-01-02 DIAGNOSIS — N10 Acute tubulo-interstitial nephritis: Secondary | ICD-10-CM | POA: Insufficient documentation

## 2019-01-02 DIAGNOSIS — T8130XA Disruption of wound, unspecified, initial encounter: Secondary | ICD-10-CM | POA: Diagnosis not present

## 2019-01-02 DIAGNOSIS — Z8249 Family history of ischemic heart disease and other diseases of the circulatory system: Secondary | ICD-10-CM | POA: Insufficient documentation

## 2019-01-02 DIAGNOSIS — Z981 Arthrodesis status: Secondary | ICD-10-CM | POA: Insufficient documentation

## 2019-01-02 DIAGNOSIS — Z1619 Resistance to other specified beta lactam antibiotics: Secondary | ICD-10-CM | POA: Insufficient documentation

## 2019-01-02 DIAGNOSIS — Z1629 Resistance to other single specified antibiotic: Secondary | ICD-10-CM | POA: Diagnosis not present

## 2019-01-02 DIAGNOSIS — M869 Osteomyelitis, unspecified: Secondary | ICD-10-CM | POA: Diagnosis not present

## 2019-01-02 DIAGNOSIS — Z9049 Acquired absence of other specified parts of digestive tract: Secondary | ICD-10-CM | POA: Insufficient documentation

## 2019-01-02 DIAGNOSIS — Z6838 Body mass index (BMI) 38.0-38.9, adult: Secondary | ICD-10-CM | POA: Insufficient documentation

## 2019-01-02 DIAGNOSIS — D509 Iron deficiency anemia, unspecified: Secondary | ICD-10-CM | POA: Diagnosis not present

## 2019-01-02 DIAGNOSIS — Z87440 Personal history of urinary (tract) infections: Secondary | ICD-10-CM | POA: Insufficient documentation

## 2019-01-02 HISTORY — PX: LUMBAR WOUND DEBRIDEMENT: SHX1988

## 2019-01-02 LAB — BASIC METABOLIC PANEL
Anion gap: 11 (ref 5–15)
BUN: 6 mg/dL (ref 6–20)
CO2: 25 mmol/L (ref 22–32)
Calcium: 8.7 mg/dL — ABNORMAL LOW (ref 8.9–10.3)
Chloride: 99 mmol/L (ref 98–111)
Creatinine, Ser: 0.45 mg/dL (ref 0.44–1.00)
GFR calc Af Amer: >60 mL/min (ref >60)
GFR calc non Af Amer: >60 mL/min (ref >60)
Glucose, Bld: 101 mg/dL — ABNORMAL HIGH (ref 70–99)
Potassium: 3.2 mmol/L — ABNORMAL LOW (ref 3.5–5.1)
Sodium: 135 mmol/L (ref 135–145)

## 2019-01-02 LAB — CBC WITH DIFFERENTIAL/PLATELET
Abs Immature Granulocytes: 0.07 10*3/uL (ref 0.00–0.07)
Basophils Absolute: 0.1 10*3/uL (ref 0.0–0.1)
Basophils Relative: 0 %
Eosinophils Absolute: 0.6 10*3/uL — ABNORMAL HIGH (ref 0.0–0.5)
Eosinophils Relative: 5 %
HCT: 41.6 % (ref 36.0–46.0)
Hemoglobin: 12.6 g/dL (ref 12.0–15.0)
Immature Granulocytes: 1 %
Lymphocytes Relative: 29 %
Lymphs Abs: 3.7 10*3/uL (ref 0.7–4.0)
MCH: 27 pg (ref 26.0–34.0)
MCHC: 30.3 g/dL (ref 30.0–36.0)
MCV: 89.1 fL (ref 80.0–100.0)
Monocytes Absolute: 0.8 10*3/uL (ref 0.1–1.0)
Monocytes Relative: 6 %
Neutro Abs: 7.7 10*3/uL (ref 1.7–7.7)
Neutrophils Relative %: 59 %
Platelets: 683 10*3/uL — ABNORMAL HIGH (ref 150–400)
RBC: 4.67 MIL/uL (ref 3.87–5.11)
RDW: 15.1 % (ref 11.5–15.5)
WBC: 13 10*3/uL — ABNORMAL HIGH (ref 4.0–10.5)
nRBC: 0 % (ref 0.0–0.2)

## 2019-01-02 LAB — SARS CORONAVIRUS 2 BY RT PCR (HOSPITAL ORDER, PERFORMED IN ~~LOC~~ HOSPITAL LAB): SARS Coronavirus 2: NEGATIVE

## 2019-01-02 SURGERY — LUMBAR WOUND DEBRIDEMENT
Anesthesia: General | Site: Back

## 2019-01-02 MED ORDER — 0.9 % SODIUM CHLORIDE (POUR BTL) OPTIME
TOPICAL | Status: DC | PRN
  Administered 2019-01-02: 1000 mL

## 2019-01-02 MED ORDER — ONDANSETRON HCL 4 MG/2ML IJ SOLN: 4.0000 mg | Freq: Once | INTRAMUSCULAR | Status: DC | PRN

## 2019-01-02 MED ORDER — BACITRACIN ZINC 500 UNIT/GM EX OINT
TOPICAL_OINTMENT | CUTANEOUS | Status: DC | PRN
  Administered 2019-01-02: 1 via TOPICAL

## 2019-01-02 MED ORDER — PHENYLEPHRINE 40 MCG/ML (10ML) SYRINGE FOR IV PUSH (FOR BLOOD PRESSURE SUPPORT)
PREFILLED_SYRINGE | INTRAVENOUS | Status: AC
  Filled 2019-01-02: qty 10

## 2019-01-02 MED ORDER — CLINDAMYCIN PHOSPHATE 600 MG/50ML IV SOLN
600.0000 mg | Freq: Once | INTRAVENOUS | Status: AC
  Administered 2019-01-02: 19:00:00 600 mg via INTRAVENOUS
  Filled 2019-01-02: qty 50

## 2019-01-02 MED ORDER — MIDAZOLAM HCL 2 MG/2ML IJ SOLN
INTRAMUSCULAR | Status: AC
  Filled 2019-01-02: qty 2

## 2019-01-02 MED ORDER — ONDANSETRON HCL 4 MG/2ML IJ SOLN
INTRAMUSCULAR | Status: DC | PRN
  Administered 2019-01-02: 4 mg via INTRAVENOUS

## 2019-01-02 MED ORDER — PROPOFOL 10 MG/ML IV BOLUS
INTRAVENOUS | Status: DC | PRN
  Administered 2019-01-02: 20 mg via INTRAVENOUS
  Administered 2019-01-02: 100 mg via INTRAVENOUS

## 2019-01-02 MED ORDER — ROCURONIUM BROMIDE 10 MG/ML (PF) SYRINGE
PREFILLED_SYRINGE | INTRAVENOUS | Status: AC
  Filled 2019-01-02: qty 10

## 2019-01-02 MED ORDER — SUCCINYLCHOLINE CHLORIDE 200 MG/10ML IV SOSY
PREFILLED_SYRINGE | INTRAVENOUS | Status: AC
  Filled 2019-01-02: qty 10

## 2019-01-02 MED ORDER — HYDROMORPHONE HCL 1 MG/ML IJ SOLN
0.2500 mg | INTRAMUSCULAR | Status: DC | PRN
  Administered 2019-01-02 (×4): 0.5 mg via INTRAVENOUS

## 2019-01-02 MED ORDER — SCOPOLAMINE 1 MG/3DAYS TD PT72
MEDICATED_PATCH | TRANSDERMAL | Status: AC
  Filled 2019-01-02: qty 1

## 2019-01-02 MED ORDER — LIDOCAINE 2% (20 MG/ML) 5 ML SYRINGE
INTRAMUSCULAR | Status: AC
  Filled 2019-01-02: qty 5

## 2019-01-02 MED ORDER — MORPHINE SULFATE (PF) 4 MG/ML IV SOLN
4.0000 mg | Freq: Once | INTRAVENOUS | Status: AC
  Administered 2019-01-02: 4 mg via INTRAVENOUS
  Filled 2019-01-02: qty 1

## 2019-01-02 MED ORDER — HYDROMORPHONE HCL 1 MG/ML IJ SOLN
INTRAMUSCULAR | Status: AC
  Administered 2019-01-02: 0.5 mg via INTRAVENOUS
  Filled 2019-01-02: qty 1

## 2019-01-02 MED ORDER — LIDOCAINE HCL (CARDIAC) PF 100 MG/5ML IV SOSY
PREFILLED_SYRINGE | INTRAVENOUS | Status: DC | PRN
  Administered 2019-01-02: 100 mg via INTRAVENOUS

## 2019-01-02 MED ORDER — MEPERIDINE HCL 25 MG/ML IJ SOLN
6.2500 mg | INTRAMUSCULAR | Status: DC | PRN
  Administered 2019-01-02 (×2): 12.5 mg via INTRAVENOUS

## 2019-01-02 MED ORDER — ONDANSETRON HCL 4 MG/2ML IJ SOLN
INTRAMUSCULAR | Status: AC
  Filled 2019-01-02: qty 2

## 2019-01-02 MED ORDER — EPHEDRINE 5 MG/ML INJ
INTRAVENOUS | Status: AC
  Filled 2019-01-02: qty 10

## 2019-01-02 MED ORDER — FENTANYL CITRATE (PF) 100 MCG/2ML IJ SOLN
INTRAMUSCULAR | Status: DC | PRN
  Administered 2019-01-02: 50 ug via INTRAVENOUS
  Administered 2019-01-02: 100 ug via INTRAVENOUS

## 2019-01-02 MED ORDER — MEPERIDINE HCL 25 MG/ML IJ SOLN
INTRAMUSCULAR | Status: AC
  Administered 2019-01-02: 12.5 mg via INTRAVENOUS
  Filled 2019-01-02: qty 1

## 2019-01-02 MED ORDER — ROCURONIUM BROMIDE 100 MG/10ML IV SOLN
INTRAVENOUS | Status: DC | PRN
  Administered 2019-01-02: 70 mg via INTRAVENOUS

## 2019-01-02 MED ORDER — CLINDAMYCIN HCL 150 MG PO CAPS: 150.0000 mg | ORAL_CAPSULE | Freq: Four times a day (QID) | ORAL | 0 refills | Status: DC

## 2019-01-02 MED ORDER — SODIUM CHLORIDE 0.9 % IR SOLN
Status: DC | PRN
  Administered 2019-01-02: 3000 mL

## 2019-01-02 MED ORDER — FENTANYL CITRATE (PF) 250 MCG/5ML IJ SOLN
INTRAMUSCULAR | Status: AC
  Filled 2019-01-02: qty 5

## 2019-01-02 MED ORDER — DEXAMETHASONE SODIUM PHOSPHATE 4 MG/ML IJ SOLN
INTRAMUSCULAR | Status: DC | PRN
  Administered 2019-01-02: 4 mg via INTRAVENOUS

## 2019-01-02 MED ORDER — PROPOFOL 10 MG/ML IV BOLUS
INTRAVENOUS | Status: AC
  Filled 2019-01-02: qty 20

## 2019-01-02 MED ORDER — DEXAMETHASONE SODIUM PHOSPHATE 10 MG/ML IJ SOLN
INTRAMUSCULAR | Status: AC
  Filled 2019-01-02: qty 2

## 2019-01-02 MED ORDER — BACITRACIN ZINC 500 UNIT/GM EX OINT
TOPICAL_OINTMENT | CUTANEOUS | Status: AC
  Filled 2019-01-02: qty 28.35

## 2019-01-02 MED ORDER — SUGAMMADEX SODIUM 200 MG/2ML IV SOLN
INTRAVENOUS | Status: DC | PRN
  Administered 2019-01-02: 200 mg via INTRAVENOUS

## 2019-01-02 MED ORDER — DEXAMETHASONE SODIUM PHOSPHATE 10 MG/ML IJ SOLN
INTRAMUSCULAR | Status: AC
  Filled 2019-01-02: qty 1

## 2019-01-02 MED ORDER — MIDAZOLAM HCL 5 MG/5ML IJ SOLN
INTRAMUSCULAR | Status: DC | PRN
  Administered 2019-01-02: 2 mg via INTRAVENOUS

## 2019-01-02 SURGICAL SUPPLY — 54 items
BAG DECANTER FOR FLEXI CONT (MISCELLANEOUS) ×2 IMPLANT
BENZOIN TINCTURE PRP APPL 2/3 (GAUZE/BANDAGES/DRESSINGS) IMPLANT
BINDER ABDOMINAL 12 ML 46-62 (SOFTGOODS) IMPLANT
BLADE CLIPPER SURG (BLADE) IMPLANT
CHLORAPREP W/TINT 26 (MISCELLANEOUS) ×2 IMPLANT
COVER WAND RF STERILE (DRAPES) ×2 IMPLANT
DERMABOND ADVANCED (GAUZE/BANDAGES/DRESSINGS) ×1
DERMABOND ADVANCED .7 DNX12 (GAUZE/BANDAGES/DRESSINGS) ×1 IMPLANT
DRAPE LAPAROTOMY 100X72X124 (DRAPES) ×2 IMPLANT
DRAPE SURG 17X23 STRL (DRAPES) ×2 IMPLANT
DRSG OPSITE POSTOP 3X4 (GAUZE/BANDAGES/DRESSINGS) IMPLANT
DRSG OPSITE POSTOP 4X6 (GAUZE/BANDAGES/DRESSINGS) ×2 IMPLANT
ELECT REM PT RETURN 9FT ADLT (ELECTROSURGICAL) ×2
ELECTRODE REM PT RTRN 9FT ADLT (ELECTROSURGICAL) ×1 IMPLANT
GAUZE 4X4 16PLY RFD (DISPOSABLE) ×4 IMPLANT
GLOVE BIO SURGEON STRL SZ 6.5 (GLOVE) ×2 IMPLANT
GLOVE BIO SURGEON STRL SZ7 (GLOVE) ×2 IMPLANT
GLOVE BIOGEL PI IND STRL 7.5 (GLOVE) ×1 IMPLANT
GLOVE BIOGEL PI INDICATOR 7.5 (GLOVE) ×1
GLOVE ECLIPSE 7.5 STRL STRAW (GLOVE) ×2 IMPLANT
GLOVE EXAM NITRILE LRG STRL (GLOVE) IMPLANT
GLOVE EXAM NITRILE XL STR (GLOVE) IMPLANT
GLOVE EXAM NITRILE XS STR PU (GLOVE) IMPLANT
GOWN STRL REUS W/ TWL LRG LVL3 (GOWN DISPOSABLE) ×2 IMPLANT
GOWN STRL REUS W/ TWL XL LVL3 (GOWN DISPOSABLE) IMPLANT
GOWN STRL REUS W/TWL 2XL LVL3 (GOWN DISPOSABLE) IMPLANT
GOWN STRL REUS W/TWL LRG LVL3 (GOWN DISPOSABLE) ×2
GOWN STRL REUS W/TWL XL LVL3 (GOWN DISPOSABLE)
HANDPIECE INTERPULSE COAX TIP (DISPOSABLE)
KIT BASIN OR (CUSTOM PROCEDURE TRAY) ×2 IMPLANT
KIT TURNOVER KIT B (KITS) ×2 IMPLANT
NEEDLE HYPO 25X1 1.5 SAFETY (NEEDLE) ×2 IMPLANT
NS IRRIG 1000ML POUR BTL (IV SOLUTION) ×2 IMPLANT
PACK LAMINECTOMY NEURO (CUSTOM PROCEDURE TRAY) ×2 IMPLANT
PAD ARMBOARD 7.5X6 YLW CONV (MISCELLANEOUS) ×2 IMPLANT
SET HNDPC FAN SPRY TIP SCT (DISPOSABLE) IMPLANT
SPONGE LAP 4X18 RFD (DISPOSABLE) ×2 IMPLANT
SPONGE SURGIFOAM ABS GEL SZ50 (HEMOSTASIS) IMPLANT
STAPLER SKIN PROX WIDE 3.9 (STAPLE) ×2 IMPLANT
STRIP CLOSURE SKIN 1/2X4 (GAUZE/BANDAGES/DRESSINGS) IMPLANT
SUT MNCRL AB 4-0 PS2 18 (SUTURE) IMPLANT
SUT SILK 0 (SUTURE)
SUT SILK 0 MO-6 18XCR BRD 8 (SUTURE) IMPLANT
SUT SILK 0 TIES 10X30 (SUTURE) IMPLANT
SUT SILK 2 0 TIES 10X30 (SUTURE) IMPLANT
SUT VIC AB 1 CT1 18XBRD ANBCTR (SUTURE) ×3 IMPLANT
SUT VIC AB 1 CT1 8-18 (SUTURE) ×3
SUT VIC AB 2-0 CP2 18 (SUTURE) ×4 IMPLANT
SWAB COLLECTION DEVICE MRSA (MISCELLANEOUS) IMPLANT
SWAB CULTURE ESWAB REG 1ML (MISCELLANEOUS) IMPLANT
TOWEL GREEN STERILE (TOWEL DISPOSABLE) ×2 IMPLANT
TOWEL GREEN STERILE FF (TOWEL DISPOSABLE) ×2 IMPLANT
WATER STERILE IRR 1000ML POUR (IV SOLUTION) ×2 IMPLANT
YANKAUER SUCT BULB TIP NO VENT (SUCTIONS) ×2 IMPLANT

## 2019-01-02 NOTE — Progress Notes (Signed)
Changed 1 week ago per pt at home - no date on bag - chronic urinary retention

## 2019-01-02 NOTE — ED Notes (Signed)
Spoke with [employer] will send truck to transport patient to [facility] accepting Dr. Vanita Panda per [place] PA.

## 2019-01-02 NOTE — ED Notes (Signed)
Report to Lelon Huh, RN, CN [facility]

## 2019-01-02 NOTE — Op Note (Signed)
  PRE-OPERATIVE DIAGNOSIS:  lumbar wound dehiscence/infection  POST-OPERATIVE DIAGNOSIS:  lumbar wound dehiscence/ infection  PROCEDURE:  Procedure(s): LUMBAR WOUND DEBRIDEMENT (N/A)  SURGEON:  Surgeon(s) and Role:    * Clydell Hakim, MD - Primary  PHYSICIAN ASSISTANT: none  ASSISTANTS: none   ANESTHESIA:   general  EBL:  10 mL   BLOOD ADMINISTERED:none  DRAINS: none   LOCAL MEDICATIONS USED:  NONE  SPECIMEN:  No Specimen  DISPOSITION OF SPECIMEN:  N/A  COUNTS:  YES  TOURNIQUET:  * No tourniquets in log *  DICTATION: After discussion of risk benefits and alternatives were informed consent was obtained.  Patient was taken to the operative suite, general anesthesia and induced by the anesthesia team, the patient turned prone.  She was positioned and padded at all her pressure points carefully.  Her wound was opened, and cultures obtained.  After that, the area overlying the wound, and surrounding it was prepped with ChloraPrep draped in usual sterile and standard fashion.  The entire length of the wound was opened, and sharply debrided with the Bovie electrocautery, and Metzenbaum scissors.  There was no obvious infection or pus, but a good amount of gently necrotic and rind tissue was excised.  The entire wound was then irrigated with Pulsavac, some 1500 mL.  Wound was reinspected, hemostasis obtained, then closed in 2 deep layers of 1-0 interrupted Monocryl sutures, the more superficial tissue closed with a interrupted 2-0 Monocryl suture, and the skin closed with staples.  Centration ointment and a sterile dressing were then applied.  Patient was then turned supine to the stretcher and returned to the care of the anesthesia team for extubation.  Needle and sponge counts were correct x2 at the end of the case.  PLAN OF CARE: Discharge to home after PACU  PATIENT DISPOSITION:  PACU - hemodynamically stable.   Delay start of Pharmacological VTE agent (>24hrs) due to surgical  blood loss or risk of bleeding: not applicable
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
## 2019-01-02 NOTE — ED Triage Notes (Signed)
Pt brought in by EMS . Pt has been paraplegic since 2018. Pt had sx to place a spinal stimulator on the 21st . Pt has healed incision to abdomen and incision to back that has began to open and smell. Pt reports it has been opening for a week and worse the past day

## 2019-01-02 NOTE — Anesthesia Preprocedure Evaluation (Signed)
Anesthesia Evaluation  Patient identified by MRN, date of birth, ID band Patient awake    Reviewed: Allergy & Precautions, NPO status , Patient's Chart, lab work & pertinent test results  History of Anesthesia Complications (+) PONV  Airway Mallampati: I  TM Distance: >3 FB Neck ROM: Full    Dental   Pulmonary Current Smoker,    Pulmonary exam normal        Cardiovascular Normal cardiovascular exam     Neuro/Psych Anxiety Depression    GI/Hepatic GERD  Medicated and Controlled,  Endo/Other    Renal/GU      Musculoskeletal   Abdominal   Peds  Hematology   Anesthesia Other Findings   Reproductive/Obstetrics                             Anesthesia Physical Anesthesia Plan  ASA: III and emergent  Anesthesia Plan: General   Post-op Pain Management:    Induction: Intravenous  PONV Risk Score and Plan: 3 and Midazolam, Ondansetron and Treatment may vary due to age or medical condition  Airway Management Planned: Oral ETT  Additional Equipment:   Intra-op Plan:   Post-operative Plan: Extubation in OR  Informed Consent: I have reviewed the patients History and Physical, chart, labs and discussed the procedure including the risks, benefits and alternatives for the proposed anesthesia with the patient or authorized representative who has indicated his/her understanding and acceptance.       Plan Discussed with: CRNA and Surgeon  Anesthesia Plan Comments:         Anesthesia Quick Evaluation

## 2019-01-02 NOTE — Transfer of Care (Signed)
Immediate Anesthesia Transfer of Care Note  Patient: [name]  Procedure(s) Performed: LUMBAR WOUND DEBRIDEMENT (N/A Back)  Patient Location: PACU  Anesthesia Type:General  Level of Consciousness: awake, alert , oriented and patient cooperative  Airway & Oxygen Therapy: Patient Spontanous Breathing  Post-op Assessment: Report given to RN and Post -op Vital signs reviewed and stable  Post vital signs: Reviewed and stable  Last Vitals:  Vitals Value Taken Time  BP 125/73 01/02/19 1955  Temp    Pulse 86 01/02/19 1956  Resp 21 01/02/19 1956  SpO2 99 % 01/02/19 1956  Vitals shown include unvalidated device data.  Last Pain:  Vitals:   01/02/19 1657  TempSrc: Oral  PainSc:          Complications: No apparent anesthesia complications
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
## 2019-01-02 NOTE — Discharge Instructions (Signed)
Dr. Jaymian Bogart Post-Op Orders ° °• Ice Pack - 20 minutes on (in a pillow case), and 20 minutes off. Wear the ice pack UNDER the binder. °• Follow up in office, they will call you for an appointment in 10 days to 2 weeks. °• Increase activity gradually.   °• No lifting anything heavier than a gallon of milk (10 pounds) until seen in the office. °• Advance diet slowly as tolerated. °• Dressing care:  Keep dressing dry for 3 days, and on Post-op day 4, may shower. °• Call for fever, drainage, and redness. °• No swimming or bathing in a bathtub (do not get into standing water). °•  °

## 2019-01-02 NOTE — Brief Op Note (Addendum)
01/02/2019  7:46 PM  PATIENT:  [name]  38 y.o. female  PRE-OPERATIVE DIAGNOSIS:  lumbar wound infection  POST-OPERATIVE DIAGNOSIS:  lumbar wound infection  PROCEDURE:  Procedure(s): LUMBAR WOUND DEBRIDEMENT (N/A)  SURGEON:  Surgeon(s) and Role:    * Clydell Hakim, MD - Primary  PHYSICIAN ASSISTANT: none  ASSISTANTS: none   ANESTHESIA:   general  EBL:  10 mL   BLOOD ADMINISTERED:none  DRAINS: none   LOCAL MEDICATIONS USED:  NONE  SPECIMEN:  No Specimen  DISPOSITION OF SPECIMEN:  N/A  COUNTS:  YES  TOURNIQUET:  * No tourniquets in log *  DICTATION: .Dragon Dictation  PLAN OF CARE: Discharge to home after PACU  PATIENT DISPOSITION:  PACU - hemodynamically stable.   Delay start of Pharmacological VTE agent (>24hrs) due to surgical blood loss or risk of bleeding: not applicable

## 2019-01-02 NOTE — ED Triage Notes (Addendum)
[facility] states the patient is being  sent to OR from [facility] to get wound washed out.  Vitals:  B/p:108/53 Pulse: 79 O2:95% RA RR-20

## 2019-01-02 NOTE — Anesthesia Procedure Notes (Signed)
Procedure Name: Intubation Date/Time: 01/02/2019 6:48 PM Performed by: Sammie Bench, CRNA Pre-anesthesia Checklist: Patient identified, Emergency Drugs available, Suction available and Patient being monitored Patient Re-evaluated:Patient Re-evaluated prior to induction Oxygen Delivery Method: Circle System Utilized Preoxygenation: Pre-oxygenation with 100% oxygen Induction Type: IV induction Ventilation: Mask ventilation without difficulty Laryngoscope Size: Glidescope and 3 Grade View: Grade I Tube type: Oral Tube size: 7.0 mm Number of attempts: 1 Airway Equipment and Method: Stylet and Oral airway Placement Confirmation: ETT inserted through vocal cords under direct vision,  positive ETCO2 and breath sounds checked- equal and bilateral Secured at: 21 cm Tube secured with: Tape Dental Injury: Teeth and Oropharynx as per pre-operative assessment

## 2019-01-02 NOTE — ED Notes (Signed)
Pt is paraplegic from [place]  [facility]  10/21 for intrathecal device Noticed back wound opening and smelling- now smell is worse with ? Necrosis to surgical wound   IV in place with labs drawn

## 2019-01-03 ENCOUNTER — Encounter (HOSPITAL_COMMUNITY): Payer: Self-pay | Admitting: Anesthesiology

## 2019-01-03 NOTE — Anesthesia Postprocedure Evaluation (Signed)
Anesthesia Post Note  Patient: Chani A Salim  Procedure(s) Performed: LUMBAR WOUND DEBRIDEMENT (N/A Back)     Patient location during evaluation: PACU Anesthesia Type: General Level of consciousness: awake and alert Pain management: pain level controlled Vital Signs Assessment: post-procedure vital signs reviewed and stable Respiratory status: spontaneous breathing, nonlabored ventilation, respiratory function stable and patient connected to nasal cannula oxygen Cardiovascular status: blood pressure returned to baseline and stable Postop Assessment: no apparent nausea or vomiting Anesthetic complications: no    Last Vitals:  Vitals:   01/02/19 2110 01/02/19 2120  BP: 129/77   Pulse: 77 85  Resp: 14 (!) 23  Temp:  (!) 36.2 C  SpO2: 97% 94%    Last Pain:  Vitals:   01/02/19 2120  TempSrc:   PainSc: 4                  Emmalou Hunger DAVID

## 2019-01-04 ENCOUNTER — Other Ambulatory Visit (HOSPITAL_COMMUNITY)
Admission: RE | Admit: 2019-01-04 | Discharge: 2019-01-04 | Disposition: A | Discharge status: Home / Self Care | Payer: Medicaid Other | Source: Ambulatory Visit | Attending: Anesthesiology | Admitting: Anesthesiology

## 2019-01-04 ENCOUNTER — Other Ambulatory Visit: Payer: Self-pay

## 2019-01-04 LAB — AEROBIC CULTURE W GRAM STAIN (SUPERFICIAL SPECIMEN)
Gram Stain: NONE SEEN
Special Requests: NORMAL

## 2019-01-07 ENCOUNTER — Encounter (HOSPITAL_COMMUNITY)
Admission: EM | Disposition: A | Discharge status: Home Health | Payer: Self-pay | Source: Home / Self Care | Attending: Anesthesiology

## 2019-01-07 ENCOUNTER — Ambulatory Visit (HOSPITAL_COMMUNITY): Admission: RE | Admit: 2019-01-07 | Payer: Medicaid Other | Source: Home / Self Care | Admitting: Anesthesiology

## 2019-01-07 ENCOUNTER — Inpatient Hospital Stay (HOSPITAL_COMMUNITY)
Admission: EM | Admit: 2019-01-07 | Discharge: 2019-01-12 | DRG: 857 | Disposition: A | Discharge status: Home Health | Payer: Medicaid Other | Attending: Anesthesiology | Admitting: Anesthesiology

## 2019-01-07 ENCOUNTER — Inpatient Hospital Stay (HOSPITAL_COMMUNITY): Payer: Medicaid Other | Admitting: Anesthesiology

## 2019-01-07 ENCOUNTER — Encounter (HOSPITAL_COMMUNITY): Admission: RE | Payer: Self-pay | Source: Home / Self Care

## 2019-01-07 DIAGNOSIS — X58XXXS Exposure to other specified factors, sequela: Secondary | ICD-10-CM | POA: Diagnosis not present

## 2019-01-07 DIAGNOSIS — G8220 Paraplegia, unspecified: Secondary | ICD-10-CM | POA: Diagnosis present

## 2019-01-07 DIAGNOSIS — Z95828 Presence of other vascular implants and grafts: Secondary | ICD-10-CM | POA: Diagnosis not present

## 2019-01-07 DIAGNOSIS — F17210 Nicotine dependence, cigarettes, uncomplicated: Secondary | ICD-10-CM | POA: Diagnosis present

## 2019-01-07 DIAGNOSIS — Z20828 Contact with and (suspected) exposure to other viral communicable diseases: Secondary | ICD-10-CM | POA: Diagnosis present

## 2019-01-07 DIAGNOSIS — Y848 Other medical procedures as the cause of abnormal reaction of the patient, or of later complication, without mention of misadventure at the time of the procedure: Secondary | ICD-10-CM | POA: Diagnosis present

## 2019-01-07 DIAGNOSIS — L089 Local infection of the skin and subcutaneous tissue, unspecified: Secondary | ICD-10-CM

## 2019-01-07 DIAGNOSIS — T8149XA Infection following a procedure, other surgical site, initial encounter: Secondary | ICD-10-CM | POA: Diagnosis not present

## 2019-01-07 DIAGNOSIS — Z1612 Extended spectrum beta lactamase (ESBL) resistance: Secondary | ICD-10-CM | POA: Diagnosis not present

## 2019-01-07 DIAGNOSIS — T148XXA Other injury of unspecified body region, initial encounter: Secondary | ICD-10-CM

## 2019-01-07 DIAGNOSIS — E039 Hypothyroidism, unspecified: Secondary | ICD-10-CM | POA: Diagnosis present

## 2019-01-07 DIAGNOSIS — Z978 Presence of other specified devices: Secondary | ICD-10-CM | POA: Diagnosis not present

## 2019-01-07 DIAGNOSIS — T8141XA Infection following a procedure, superficial incisional surgical site, initial encounter: Principal | ICD-10-CM | POA: Diagnosis present

## 2019-01-07 DIAGNOSIS — L89159 Pressure ulcer of sacral region, unspecified stage: Secondary | ICD-10-CM | POA: Diagnosis not present

## 2019-01-07 DIAGNOSIS — K219 Gastro-esophageal reflux disease without esophagitis: Secondary | ICD-10-CM | POA: Diagnosis present

## 2019-01-07 DIAGNOSIS — B9620 Unspecified Escherichia coli [E. coli] as the cause of diseases classified elsewhere: Secondary | ICD-10-CM | POA: Diagnosis present

## 2019-01-07 DIAGNOSIS — T8131XA Disruption of external operation (surgical) wound, not elsewhere classified, initial encounter: Secondary | ICD-10-CM | POA: Diagnosis present

## 2019-01-07 DIAGNOSIS — S24104A Unspecified injury at T11-T12 level of thoracic spinal cord, initial encounter: Secondary | ICD-10-CM | POA: Diagnosis not present

## 2019-01-07 DIAGNOSIS — B9689 Other specified bacterial agents as the cause of diseases classified elsewhere: Secondary | ICD-10-CM | POA: Diagnosis not present

## 2019-01-07 HISTORY — PX: WOUND EXPLORATION: SHX6188

## 2019-01-07 LAB — COMPREHENSIVE METABOLIC PANEL
ALT: 11 U/L (ref 0–44)
AST: 11 U/L — ABNORMAL LOW (ref 15–41)
Albumin: 3.1 g/dL — ABNORMAL LOW (ref 3.5–5.0)
Alkaline Phosphatase: 73 U/L (ref 38–126)
Anion gap: 11 (ref 5–15)
BUN: <5 mg/dL — ABNORMAL LOW (ref 6–20)
CO2: 26 mmol/L (ref 22–32)
Calcium: 8.9 mg/dL (ref 8.9–10.3)
Chloride: 97 mmol/L — ABNORMAL LOW (ref 98–111)
Creatinine, Ser: 0.39 mg/dL — ABNORMAL LOW (ref 0.44–1.00)
GFR calc Af Amer: >60 mL/min (ref >60)
GFR calc non Af Amer: >60 mL/min (ref >60)
Glucose, Bld: 79 mg/dL (ref 70–99)
Potassium: 3.5 mmol/L (ref 3.5–5.1)
Sodium: 134 mmol/L — ABNORMAL LOW (ref 135–145)
Total Bilirubin: 0.2 mg/dL — ABNORMAL LOW (ref 0.3–1.2)
Total Protein: 6.9 g/dL (ref 6.5–8.1)

## 2019-01-07 LAB — CBC WITH DIFFERENTIAL/PLATELET
Abs Immature Granulocytes: 0.07 10*3/uL (ref 0.00–0.07)
Basophils Absolute: 0.1 10*3/uL (ref 0.0–0.1)
Basophils Relative: 0 %
Eosinophils Absolute: 0.5 10*3/uL (ref 0.0–0.5)
Eosinophils Relative: 4 %
HCT: 41.8 % (ref 36.0–46.0)
Hemoglobin: 12.9 g/dL (ref 12.0–15.0)
Immature Granulocytes: 1 %
Lymphocytes Relative: 30 %
Lymphs Abs: 4.1 10*3/uL — ABNORMAL HIGH (ref 0.7–4.0)
MCH: 27.3 pg (ref 26.0–34.0)
MCHC: 30.9 g/dL (ref 30.0–36.0)
MCV: 88.4 fL (ref 80.0–100.0)
Monocytes Absolute: 0.9 10*3/uL (ref 0.1–1.0)
Monocytes Relative: 7 %
Neutro Abs: 8.1 10*3/uL — ABNORMAL HIGH (ref 1.7–7.7)
Neutrophils Relative %: 58 %
Platelets: 714 10*3/uL — ABNORMAL HIGH (ref 150–400)
RBC: 4.73 MIL/uL (ref 3.87–5.11)
RDW: 14.6 % (ref 11.5–15.5)
WBC: 13.7 10*3/uL — ABNORMAL HIGH (ref 4.0–10.5)
nRBC: 0 % (ref 0.0–0.2)

## 2019-01-07 LAB — SARS CORONAVIRUS 2 (TAT 6-24 HRS): SARS Coronavirus 2: NEGATIVE

## 2019-01-07 LAB — AEROBIC/ANAEROBIC CULTURE W GRAM STAIN (SURGICAL/DEEP WOUND): Gram Stain: NONE SEEN

## 2019-01-07 LAB — PROTIME-INR
INR: 1.1 (ref 0.8–1.2)
Prothrombin Time: 14.1 seconds (ref 11.4–15.2)

## 2019-01-07 LAB — APTT: aPTT: 29 seconds (ref 24–36)

## 2019-01-07 SURGERY — INTRATHECAL PUMP IMPLANT
Anesthesia: General | Laterality: Left

## 2019-01-07 SURGERY — WOUND EXPLORATION
Anesthesia: General | Site: Back

## 2019-01-07 MED ORDER — SODIUM CHLORIDE 0.9 % IV SOLN: 250.0000 mL | INTRAVENOUS | Status: DC | PRN

## 2019-01-07 MED ORDER — PROMETHAZINE HCL 25 MG/ML IJ SOLN: 6.2500 mg | INTRAMUSCULAR | Status: DC | PRN

## 2019-01-07 MED ORDER — MIDAZOLAM HCL 5 MG/5ML IJ SOLN
INTRAMUSCULAR | Status: DC | PRN
  Administered 2019-01-07: 2 mg via INTRAVENOUS

## 2019-01-07 MED ORDER — SUCCINYLCHOLINE CHLORIDE 20 MG/ML IJ SOLN
INTRAMUSCULAR | Status: DC | PRN
  Administered 2019-01-07: 100 mg via INTRAVENOUS

## 2019-01-07 MED ORDER — SUGAMMADEX SODIUM 200 MG/2ML IV SOLN
INTRAVENOUS | Status: DC | PRN
  Administered 2019-01-07: 200 mg via INTRAVENOUS

## 2019-01-07 MED ORDER — POTASSIUM CHLORIDE CRYS ER 20 MEQ PO TBCR
20.0000 meq | EXTENDED_RELEASE_TABLET | Freq: Every day | ORAL | Status: DC
  Administered 2019-01-08 – 2019-01-12 (×5): 20 meq via ORAL
  Filled 2019-01-07 (×5): qty 1

## 2019-01-07 MED ORDER — ACETAMINOPHEN 325 MG PO TABS
650.0000 mg | ORAL_TABLET | Freq: Four times a day (QID) | ORAL | Status: DC | PRN
  Administered 2019-01-11: 650 mg via ORAL

## 2019-01-07 MED ORDER — SODIUM CHLORIDE 0.9% FLUSH: 3.0000 mL | INTRAVENOUS | Status: DC | PRN

## 2019-01-07 MED ORDER — ROCURONIUM BROMIDE 100 MG/10ML IV SOLN
INTRAVENOUS | Status: DC | PRN
  Administered 2019-01-07: 50 mg via INTRAVENOUS

## 2019-01-07 MED ORDER — VITAMIN C 500 MG PO TABS
500.0000 mg | ORAL_TABLET | Freq: Two times a day (BID) | ORAL | Status: DC
  Administered 2019-01-08 – 2019-01-12 (×10): 500 mg via ORAL
  Filled 2019-01-07 (×10): qty 1

## 2019-01-07 MED ORDER — SCOPOLAMINE 1 MG/3DAYS TD PT72
MEDICATED_PATCH | TRANSDERMAL | Status: DC | PRN
  Administered 2019-01-07: 1 via TRANSDERMAL

## 2019-01-07 MED ORDER — METHOCARBAMOL 500 MG PO TABS
1000.0000 mg | ORAL_TABLET | Freq: Four times a day (QID) | ORAL | Status: DC | PRN
  Administered 2019-01-08 – 2019-01-12 (×14): 1000 mg via ORAL
  Filled 2019-01-07 (×15): qty 2

## 2019-01-07 MED ORDER — HYDROMORPHONE HCL 1 MG/ML IJ SOLN
0.2500 mg | INTRAMUSCULAR | Status: DC | PRN
  Administered 2019-01-08 (×4): 0.5 mg via INTRAVENOUS

## 2019-01-07 MED ORDER — LIDOCAINE HCL (CARDIAC) PF 100 MG/5ML IV SOSY
PREFILLED_SYRINGE | INTRAVENOUS | Status: DC | PRN
  Administered 2019-01-07: 100 mg via INTRATRACHEAL

## 2019-01-07 MED ORDER — ACETAMINOPHEN 650 MG RE SUPP: 650.0000 mg | Freq: Four times a day (QID) | RECTAL | Status: DC | PRN

## 2019-01-07 MED ORDER — LACTATED RINGERS IV SOLN
INTRAVENOUS | Status: DC | PRN
  Administered 2019-01-07: 23:00:00 via INTRAVENOUS

## 2019-01-07 MED ORDER — SODIUM CHLORIDE 0.9 % IV SOLN: INTRAVENOUS | Status: DC

## 2019-01-07 MED ORDER — GABAPENTIN 600 MG PO TABS
600.0000 mg | ORAL_TABLET | Freq: Four times a day (QID) | ORAL | Status: DC
  Administered 2019-01-07 – 2019-01-12 (×20): 600 mg via ORAL
  Filled 2019-01-07 (×22): qty 1

## 2019-01-07 MED ORDER — OXYBUTYNIN CHLORIDE ER 5 MG PO TB24
5.0000 mg | ORAL_TABLET | Freq: Every day | ORAL | Status: DC
  Administered 2019-01-08 – 2019-01-12 (×5): 5 mg via ORAL
  Filled 2019-01-07 (×5): qty 1

## 2019-01-07 MED ORDER — 0.9 % SODIUM CHLORIDE (POUR BTL) OPTIME
TOPICAL | Status: DC | PRN
  Administered 2019-01-07: 1000 mL

## 2019-01-07 MED ORDER — VITAMIN B-12 1000 MCG PO TABS
1000.0000 ug | ORAL_TABLET | Freq: Every day | ORAL | Status: DC
  Administered 2019-01-08 – 2019-01-12 (×5): 1000 ug via ORAL
  Filled 2019-01-07 (×5): qty 1

## 2019-01-07 MED ORDER — VITAMIN D 25 MCG (1000 UNIT) PO TABS
1000.0000 [IU] | ORAL_TABLET | Freq: Every day | ORAL | Status: DC
  Administered 2019-01-08 – 2019-01-12 (×5): 1000 [IU] via ORAL
  Filled 2019-01-07 (×6): qty 1

## 2019-01-07 MED ORDER — ONDANSETRON HCL 4 MG PO TABS: 4.0000 mg | ORAL_TABLET | Freq: Four times a day (QID) | ORAL | Status: DC | PRN

## 2019-01-07 MED ORDER — ADULT MULTIVITAMIN W/MINERALS CH
1.0000 | ORAL_TABLET | Freq: Every day | ORAL | Status: DC
  Administered 2019-01-08 – 2019-01-12 (×5): 1 via ORAL
  Filled 2019-01-07 (×5): qty 1

## 2019-01-07 MED ORDER — ONDANSETRON HCL 4 MG/2ML IJ SOLN: 4.0000 mg | Freq: Four times a day (QID) | INTRAMUSCULAR | Status: DC | PRN

## 2019-01-07 MED ORDER — HYDROMORPHONE HCL 1 MG/ML IJ SOLN
1.0000 mg | Freq: Once | INTRAMUSCULAR | Status: AC
  Administered 2019-01-07: 1 mg via INTRAVENOUS
  Filled 2019-01-07: qty 1

## 2019-01-07 MED ORDER — SULFAMETHOXAZOLE-TRIMETHOPRIM 800-160 MG PO TABS: 1.0000 | ORAL_TABLET | Freq: Two times a day (BID) | ORAL | Status: DC

## 2019-01-07 MED ORDER — LEVOTHYROXINE SODIUM 88 MCG PO TABS
88.0000 ug | ORAL_TABLET | Freq: Every day | ORAL | Status: DC
  Administered 2019-01-08 – 2019-01-12 (×5): 88 ug via ORAL
  Filled 2019-01-07 (×5): qty 1

## 2019-01-07 MED ORDER — SODIUM CHLORIDE 0.9% FLUSH
3.0000 mL | Freq: Two times a day (BID) | INTRAVENOUS | Status: DC
  Administered 2019-01-08 – 2019-01-09 (×2): 3 mL via INTRAVENOUS

## 2019-01-07 MED ORDER — FERROUS SULFATE 325 (65 FE) MG PO TABS
325.0000 mg | ORAL_TABLET | Freq: Every day | ORAL | Status: DC
  Administered 2019-01-08 – 2019-01-12 (×5): 325 mg via ORAL
  Filled 2019-01-07 (×5): qty 1

## 2019-01-07 MED ORDER — CITALOPRAM HYDROBROMIDE 40 MG PO TABS
40.0000 mg | ORAL_TABLET | Freq: Every day | ORAL | Status: DC
  Administered 2019-01-08 – 2019-01-12 (×5): 40 mg via ORAL
  Filled 2019-01-07 (×5): qty 1

## 2019-01-07 MED ORDER — FENTANYL CITRATE (PF) 100 MCG/2ML IJ SOLN
50.0000 ug | Freq: Once | INTRAMUSCULAR | Status: AC
  Administered 2019-01-07: 50 ug via INTRAVENOUS
  Filled 2019-01-07: qty 2

## 2019-01-07 MED ORDER — DEXAMETHASONE SODIUM PHOSPHATE 4 MG/ML IJ SOLN
INTRAMUSCULAR | Status: DC | PRN
  Administered 2019-01-07: 4 mg via INTRAVENOUS

## 2019-01-07 MED ORDER — SULFAMETHOXAZOLE-TRIMETHOPRIM 800-160 MG PO TABS
1.0000 | ORAL_TABLET | Freq: Two times a day (BID) | ORAL | Status: DC
  Administered 2019-01-07 – 2019-01-09 (×4): 1 via ORAL
  Filled 2019-01-07 (×5): qty 1

## 2019-01-07 MED ORDER — PHENYLEPHRINE HCL (PRESSORS) 10 MG/ML IV SOLN
INTRAVENOUS | Status: DC | PRN
  Administered 2019-01-07: 40 ug via INTRAVENOUS
  Administered 2019-01-07: 80 ug via INTRAVENOUS

## 2019-01-07 MED ORDER — FENTANYL CITRATE (PF) 250 MCG/5ML IJ SOLN
INTRAMUSCULAR | Status: DC | PRN
  Administered 2019-01-07: 100 ug via INTRAVENOUS
  Administered 2019-01-07 (×3): 50 ug via INTRAVENOUS

## 2019-01-07 MED ORDER — SODIUM CHLORIDE 0.9 % IV SOLN
INTRAVENOUS | Status: DC
  Administered 2019-01-07: 15:00:00 via INTRAVENOUS

## 2019-01-07 MED ORDER — POTASSIUM CHLORIDE IN NACL 20-0.9 MEQ/L-% IV SOLN
INTRAVENOUS | Status: DC
  Administered 2019-01-08 – 2019-01-11 (×4): via INTRAVENOUS
  Filled 2019-01-07 (×8): qty 1000

## 2019-01-07 MED ORDER — OXYCODONE HCL 5 MG PO TABS: 5.0000 mg | ORAL_TABLET | ORAL | Status: DC | PRN

## 2019-01-07 MED ORDER — HYDROMORPHONE HCL 2 MG PO TABS
8.0000 mg | ORAL_TABLET | ORAL | Status: DC | PRN
  Administered 2019-01-08 – 2019-01-12 (×17): 8 mg via ORAL
  Filled 2019-01-07 (×17): qty 4

## 2019-01-07 MED ORDER — NYSTATIN 100000 UNIT/GM EX OINT
1.0000 "application " | TOPICAL_OINTMENT | Freq: Two times a day (BID) | CUTANEOUS | Status: DC
  Administered 2019-01-08 – 2019-01-12 (×9): 1 via TOPICAL
  Filled 2019-01-07 (×2): qty 15

## 2019-01-07 MED ORDER — OXYCODONE HCL 5 MG PO TABS
10.0000 mg | ORAL_TABLET | Freq: Four times a day (QID) | ORAL | Status: DC | PRN
  Administered 2019-01-07 – 2019-01-12 (×19): 10 mg via ORAL
  Filled 2019-01-07 (×21): qty 2

## 2019-01-07 MED ORDER — FENTANYL CITRATE (PF) 100 MCG/2ML IJ SOLN
50.0000 ug | INTRAMUSCULAR | Status: DC | PRN
  Administered 2019-01-07: 50 ug via INTRAVENOUS
  Filled 2019-01-07: qty 2

## 2019-01-07 MED ORDER — ALPRAZOLAM 0.25 MG PO TABS
0.2500 mg | ORAL_TABLET | Freq: Three times a day (TID) | ORAL | Status: DC
  Administered 2019-01-07 – 2019-01-12 (×15): 0.25 mg via ORAL
  Filled 2019-01-07 (×15): qty 1

## 2019-01-07 MED ORDER — ACETAMINOPHEN 500 MG PO TABS: 1000.0000 mg | ORAL_TABLET | Freq: Every day | ORAL | Status: DC | PRN

## 2019-01-07 MED ORDER — PANTOPRAZOLE SODIUM 40 MG PO TBEC
40.0000 mg | DELAYED_RELEASE_TABLET | Freq: Every day | ORAL | Status: DC
  Administered 2019-01-08 – 2019-01-12 (×5): 40 mg via ORAL
  Filled 2019-01-07 (×5): qty 1

## 2019-01-07 MED ORDER — ONDANSETRON HCL 4 MG/2ML IJ SOLN
INTRAMUSCULAR | Status: DC | PRN
  Administered 2019-01-07: 4 mg via INTRAVENOUS

## 2019-01-07 SURGICAL SUPPLY — 42 items
BAG DECANTER FOR FLEXI CONT (MISCELLANEOUS) ×2 IMPLANT
BENZOIN TINCTURE PRP APPL 2/3 (GAUZE/BANDAGES/DRESSINGS) ×2 IMPLANT
BLADE CLIPPER SURG (BLADE) IMPLANT
CANISTER SUCT 3000ML PPV (MISCELLANEOUS) ×2 IMPLANT
CANISTER WOUNDNEG PRESSURE 500 (CANNISTER) ×2 IMPLANT
CARTRIDGE OIL MAESTRO DRILL (MISCELLANEOUS) ×1 IMPLANT
COVER WAND RF STERILE (DRAPES) ×2 IMPLANT
DIFFUSER DRILL AIR PNEUMATIC (MISCELLANEOUS) ×2 IMPLANT
DRAPE LAPAROTOMY 100X72 PEDS (DRAPES) IMPLANT
DRAPE LAPAROTOMY 100X72X124 (DRAPES) IMPLANT
DRAPE POUCH INSTRU U-SHP 10X18 (DRAPES) ×2 IMPLANT
DRAPE SURG 17X23 STRL (DRAPES) ×8 IMPLANT
DRSG VAC ATS SM SENSATRAC (GAUZE/BANDAGES/DRESSINGS) ×2 IMPLANT
DRSG VERSA FOAM LRG 10X15 (GAUZE/BANDAGES/DRESSINGS) ×2 IMPLANT
ELECT REM PT RETURN 9FT ADLT (ELECTROSURGICAL) ×2
ELECTRODE REM PT RTRN 9FT ADLT (ELECTROSURGICAL) ×1 IMPLANT
GAUZE 4X4 16PLY RFD (DISPOSABLE) IMPLANT
GAUZE SPONGE 4X4 12PLY STRL (GAUZE/BANDAGES/DRESSINGS) ×2 IMPLANT
GLOVE BIO SURGEON STRL SZ 6.5 (GLOVE) ×2 IMPLANT
GLOVE BIO SURGEON STRL SZ7 (GLOVE) ×2 IMPLANT
GLOVE ECLIPSE 6.5 STRL STRAW (GLOVE) ×2 IMPLANT
GLOVE EXAM NITRILE XL STR (GLOVE) IMPLANT
GOWN STRL REUS W/ TWL LRG LVL3 (GOWN DISPOSABLE) ×2 IMPLANT
GOWN STRL REUS W/ TWL XL LVL3 (GOWN DISPOSABLE) IMPLANT
GOWN STRL REUS W/TWL LRG LVL3 (GOWN DISPOSABLE) ×2
GOWN STRL REUS W/TWL XL LVL3 (GOWN DISPOSABLE)
KIT BASIN OR (CUSTOM PROCEDURE TRAY) ×2 IMPLANT
KIT TURNOVER KIT B (KITS) ×2 IMPLANT
NEEDLE HYPO 22GX1.5 SAFETY (NEEDLE) IMPLANT
NS IRRIG 1000ML POUR BTL (IV SOLUTION) ×2 IMPLANT
OIL CARTRIDGE MAESTRO DRILL (MISCELLANEOUS) ×2
PACK LAMINECTOMY NEURO (CUSTOM PROCEDURE TRAY) ×2 IMPLANT
PAD ARMBOARD 7.5X6 YLW CONV (MISCELLANEOUS) ×6 IMPLANT
STRIP CLOSURE SKIN 1/2X4 (GAUZE/BANDAGES/DRESSINGS) ×2 IMPLANT
SUT VIC AB 1 CT1 18XBRD ANBCTR (SUTURE) ×1 IMPLANT
SUT VIC AB 1 CT1 8-18 (SUTURE) ×1
SUT VIC AB 2-0 CP2 18 (SUTURE) ×2 IMPLANT
SWAB COLLECTION DEVICE MRSA (MISCELLANEOUS) IMPLANT
SWAB CULTURE ESWAB REG 1ML (MISCELLANEOUS) IMPLANT
TOWEL GREEN STERILE (TOWEL DISPOSABLE) ×2 IMPLANT
TOWEL GREEN STERILE FF (TOWEL DISPOSABLE) ×2 IMPLANT
WATER STERILE IRR 1000ML POUR (IV SOLUTION) ×2 IMPLANT
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
## 2019-01-07 NOTE — ED Notes (Signed)
Report given to OR.

## 2019-01-07 NOTE — Anesthesia Procedure Notes (Signed)
Procedure Name: Intubation Date/Time: 01/07/2019 11:14 PM Performed by: Clovis Cao, CRNA Pre-anesthesia Checklist: Patient identified, Emergency Drugs available, Suction available, Patient being monitored and Timeout performed Patient Re-evaluated:Patient Re-evaluated prior to induction Oxygen Delivery Method: Circle system utilized Preoxygenation: Pre-oxygenation with 100% oxygen Induction Type: IV induction Laryngoscope Size: Miller and 2 Grade View: Grade I Tube type: Oral Tube size: 7.5 mm Number of attempts: 1 Airway Equipment and Method: Stylet Placement Confirmation: ETT inserted through vocal cords under direct vision,  positive ETCO2 and breath sounds checked- equal and bilateral Secured at: 22 cm Tube secured with: Tape Dental Injury: Teeth and Oropharynx as per pre-operative assessment

## 2019-01-07 NOTE — ED Notes (Signed)
Neuro surgery at bedside.

## 2019-01-07 NOTE — ED Triage Notes (Signed)
Pt to ED from home via EMS, Pt is a paraplegic, recent sx to help loosen muscles in the legs, incision to lower back, just discharged from hospital on Sunday d/t infection at surgical site, Pt reports sent picture of incision to Dr Maryjean Ka today who advised pt to come to the [facility] ED. Pt also informs nurse currently on abx for UTI. On assessment surgical site around staples has redness, and puss.

## 2019-01-07 NOTE — Progress Notes (Signed)
Mother is asking for call from MD after surgery. Contact info on chart.

## 2019-01-07 NOTE — Anesthesia Preprocedure Evaluation (Addendum)
Anesthesia Evaluation  Patient identified by MRN, date of birth, ID band Patient awake    Reviewed: Allergy & Precautions, NPO status , Patient's Chart, lab work & pertinent test results  History of Anesthesia Complications (+) PONV  Airway Mallampati: II  TM Distance: >3 FB Neck ROM: Full    Dental  (+) Poor Dentition, Dental Advisory Given   Pulmonary Current Smoker,    Pulmonary exam normal        Cardiovascular negative cardio ROS Normal cardiovascular exam     Neuro/Psych Anxiety Depression    GI/Hepatic Neg liver ROS, GERD  Medicated and Controlled,  Endo/Other  Hypothyroidism   Renal/GU negative Renal ROS     Musculoskeletal   Abdominal   Peds  Hematology   Anesthesia Other Findings   Reproductive/Obstetrics                            Anesthesia Physical  Anesthesia Plan  ASA: III and emergent  Anesthesia Plan: General   Post-op Pain Management:    Induction: Intravenous, Rapid sequence and Cricoid pressure planned  PONV Risk Score and Plan: 3 and Midazolam, Ondansetron and Treatment may vary due to age or medical condition  Airway Management Planned: Oral ETT  Additional Equipment:   Intra-op Plan:   Post-operative Plan: Extubation in OR  Informed Consent: I have reviewed the patients History and Physical, chart, labs and discussed the procedure including the risks, benefits and alternatives for the proposed anesthesia with the patient or authorized representative who has indicated his/her understanding and acceptance.     Dental advisory given  Plan Discussed with: CRNA, Anesthesiologist and Surgeon  Anesthesia Plan Comments:        Anesthesia Quick Evaluation
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
## 2019-01-08 LAB — CBC WITH DIFFERENTIAL/PLATELET
Abs Immature Granulocytes: 0.08 10*3/uL — ABNORMAL HIGH (ref 0.00–0.07)
Basophils Absolute: 0 10*3/uL (ref 0.0–0.1)
Basophils Relative: 0 %
Eosinophils Absolute: 0.2 10*3/uL (ref 0.0–0.5)
Eosinophils Relative: 1 %
HCT: 38.5 % (ref 36.0–46.0)
Hemoglobin: 12.1 g/dL (ref 12.0–15.0)
Immature Granulocytes: 1 %
Lymphocytes Relative: 7 %
Lymphs Abs: 1.1 10*3/uL (ref 0.7–4.0)
MCH: 27.6 pg (ref 26.0–34.0)
MCHC: 31.4 g/dL (ref 30.0–36.0)
MCV: 87.7 fL (ref 80.0–100.0)
Monocytes Absolute: 0.1 10*3/uL (ref 0.1–1.0)
Monocytes Relative: 1 %
Neutro Abs: 13.2 10*3/uL — ABNORMAL HIGH (ref 1.7–7.7)
Neutrophils Relative %: 90 %
Platelets: 645 10*3/uL — ABNORMAL HIGH (ref 150–400)
RBC: 4.39 MIL/uL (ref 3.87–5.11)
RDW: 14.6 % (ref 11.5–15.5)
WBC: 14.7 10*3/uL — ABNORMAL HIGH (ref 4.0–10.5)
nRBC: 0 % (ref 0.0–0.2)

## 2019-01-08 MED ORDER — CELECOXIB 200 MG PO CAPS
400.0000 mg | ORAL_CAPSULE | Freq: Once | ORAL | Status: AC
  Administered 2019-01-08: 400 mg via ORAL
  Filled 2019-01-08: qty 2

## 2019-01-08 MED ORDER — HYDROMORPHONE HCL 1 MG/ML IJ SOLN
INTRAMUSCULAR | Status: AC
  Filled 2019-01-08: qty 1

## 2019-01-08 MED ORDER — MORPHINE SULFATE (PF) 2 MG/ML IV SOLN
2.0000 mg | INTRAVENOUS | Status: DC | PRN
  Administered 2019-01-09: 2 mg via INTRAVENOUS
  Filled 2019-01-08: qty 1

## 2019-01-08 MED ORDER — ACETAMINOPHEN 500 MG PO TABS
1000.0000 mg | ORAL_TABLET | Freq: Once | ORAL | Status: DC
  Filled 2019-01-08 (×2): qty 2

## 2019-01-08 MED ORDER — CHLORHEXIDINE GLUCONATE CLOTH 2 % EX PADS
6.0000 | MEDICATED_PAD | Freq: Every day | CUTANEOUS | Status: DC
  Administered 2019-01-08 – 2019-01-12 (×5): 6 via TOPICAL

## 2019-01-08 MED ORDER — SCOPOLAMINE 1 MG/3DAYS TD PT72
1.0000 | MEDICATED_PATCH | TRANSDERMAL | Status: DC
  Filled 2019-01-08 (×2): qty 1

## 2019-01-08 MED ORDER — VANCOMYCIN HCL IN DEXTROSE 1-5 GM/200ML-% IV SOLN
1000.0000 mg | Freq: Three times a day (TID) | INTRAVENOUS | Status: DC
  Administered 2019-01-08 – 2019-01-10 (×7): 1000 mg via INTRAVENOUS
  Filled 2019-01-08 (×8): qty 200

## 2019-01-08 NOTE — Progress Notes (Signed)
Pharmacy Antibiotic Note  [name] is a 39 y.o. female admitted on 01/07/2019 with wound infection.  Pharmacy has been consulted for Vancomycin dosing. S/P OR for debridement. WBC elevated. Renal function OK.   Plan: Vancomycin 1000 mg IV q8h >>Estimated AUC: 530 PO Bactrim per MD Trend WBC, temp, renal function  F/U infectious work-up Drug levels as indicated   Height: 5\' 6"  (167.6 cm) Weight: 238 lb 1.6 oz (108 kg) IBW/kg (Calculated) : 59.3  Temp (24hrs), Avg:98.1 F (36.7 C), Min:97.9 F (36.6 C), Max:98.2 F (36.8 C)  Recent Labs  Lab 01/02/19 1236 01/07/19 1530  WBC 13.0* 13.7*  CREATININE 0.45 0.39*    Estimated Creatinine Clearance: 120.9 mL/min (A) (by C-G formula based on SCr of 0.39 mg/dL (L)).    No Known Allergies  Narda Bonds, PharmD, BCPS Clinical Pharmacist Phone: [phone]

## 2019-01-08 NOTE — Anesthesia Postprocedure Evaluation (Signed)
Anesthesia Post Note  Patient: [name]  Procedure(s) Performed: WOUND EXPLORATION (N/A Back)     Patient location during evaluation: PACU Anesthesia Type: General Level of consciousness: sedated Pain management: pain level controlled Vital Signs Assessment: post-procedure vital signs reviewed and stable Respiratory status: spontaneous breathing and respiratory function stable Cardiovascular status: stable Postop Assessment: no apparent nausea or vomiting Anesthetic complications: no                Raine Blodgett DANIEL

## 2019-01-08 NOTE — Progress Notes (Signed)
[name], female   DOB: April 15, 1980, 38 y.o.   MRN: [id number] No complaints this morning pain manageable  Afebrile temp 98.2 blood pressure heart rate stable slightly tachycardic at 94  Incision with mild saturation of the dressing neurologically unchanged  Postop day 1 I&D lumbar wound on antibiotics washout initially showed rare gram-positive cocci previous culture E. coli.  Patient remains on Vanco and Bactrim

## 2019-01-08 NOTE — Op Note (Signed)
01/07/2019  12:24 AM  PATIENT:  [name]  38 y.o. female whom last week was taken to the operating room for a debridement of a wound infection. She returned today with purulent drainage from the wound. I recommended that she return to the operating room   PRE-OPERATIVE DIAGNOSIS:  Wound Infection  POST-OPERATIVE DIAGNOSIS:  Wound Infection  PROCEDURE:  Procedure(s): WOUND EXPLORATION  SURGEON: Surgeon(s): Ashok Pall, MD  ASSISTANTS:none  ANESTHESIA:   general  EBL:  Total I/O In: 700 [I.V.:700] Out: 450 [Urine:400; Blood:50]  BLOOD ADMINISTERED:none    COUNT:correct  DRAINS: wound vac   SPECIMEN:  Source of Specimen:  wound  DICTATION: [name] was taken to the operating room, intubated, and placed under a general anesthetic without difficulty. She  was positioned prone on a wilson frame with all pressure points properly padded. I removed the staples from the wound. She was prepped and draped in a sterile manner. I used hemostats to open the wound and fluid drained out of the wound. I took samples of the fluid for aerobic and anaerobic testing.  I debrided the wound using scalpels to achieve bleeding from the wound cavity. I irrigated the wound copiously. I placed a wound vac as the dressing, and connected it. She tolerated the procedure well.   PLAN OF CARE: Admit to inpatient   PATIENT DISPOSITION:  PACU - hemodynamically stable.   Delay start of Pharmacological VTE agent (>24hrs) due to surgical blood loss or risk of bleeding:  yes

## 2019-01-08 NOTE — Transfer of Care (Signed)
Immediate Anesthesia Transfer of Care Note  Patient: [name]  Procedure(s) Performed: WOUND EXPLORATION (N/A Back)  Patient Location: PACU  Anesthesia Type:General  Level of Consciousness: awake  Airway & Oxygen Therapy: Patient Spontanous Breathing  Post-op Assessment: Report given to RN and Post -op Vital signs reviewed and stable  Post vital signs: Reviewed and stable  Last Vitals:  Vitals Value Taken Time  BP    Temp    Pulse    Resp    SpO2      Last Pain:  Vitals:   01/07/19 1909  TempSrc:   PainSc: 7          Complications: No apparent anesthesia complications

## 2019-01-09 ENCOUNTER — Encounter (HOSPITAL_COMMUNITY): Payer: Self-pay | Admitting: Neurosurgery

## 2019-01-09 LAB — VANCOMYCIN, PEAK: Vancomycin Pk: 26 ug/mL — ABNORMAL LOW (ref 30–40)

## 2019-01-09 LAB — VANCOMYCIN, TROUGH: Vancomycin Tr: 39 ug/mL (ref 15–20)

## 2019-01-09 MED ORDER — PHENOL 1.4 % MT LIQD: 1.0000 | OROMUCOSAL | Status: DC | PRN

## 2019-01-09 MED ORDER — SILVER SULFADIAZINE 1 % EX CREA
TOPICAL_CREAM | Freq: Every day | CUTANEOUS | Status: DC
  Administered 2019-01-09 – 2019-01-12 (×4): via TOPICAL
  Filled 2019-01-09: qty 85

## 2019-01-09 MED ORDER — HEPARIN SODIUM (PORCINE) 5000 UNIT/ML IJ SOLN
5000.0000 [IU] | Freq: Three times a day (TID) | INTRAMUSCULAR | Status: DC
  Administered 2019-01-09 – 2019-01-12 (×10): 5000 [IU] via SUBCUTANEOUS
  Filled 2019-01-09 (×10): qty 1

## 2019-01-09 MED ORDER — SODIUM CHLORIDE 0.9 % IV SOLN
1.0000 g | Freq: Three times a day (TID) | INTRAVENOUS | Status: DC
  Administered 2019-01-09 – 2019-01-10 (×4): 1 g via INTRAVENOUS
  Filled 2019-01-09 (×7): qty 1

## 2019-01-09 MED ORDER — GENTAMICIN SULFATE 40 MG/ML IJ SOLN
4.0000 mg/kg | INTRAVENOUS | Status: DC
  Filled 2019-01-09: qty 10.75

## 2019-01-09 NOTE — Progress Notes (Signed)
Pharmacy Antibiotic Note  [name] is a 38 y.o. female admitted on 01/07/2019 with ESBL E. coli wound infection.  Pharmacy has been consulted for Meropenem dosing.  Plan: Meropenem 1 gram IV q8hr  Height: 5\' 6"  (167.6 cm) Weight: 238 lb 1.6 oz (108 kg) IBW/kg (Calculated) : 59.3  Temp (24hrs), Avg:98.6 F (37 C), Min:98.3 F (36.8 C), Max:98.9 F (37.2 C)  Recent Labs  Lab 01/07/19 1530 01/08/19 0226  WBC 13.7* 14.7*  CREATININE 0.39*  --     Estimated Creatinine Clearance: 120.9 mL/min (A) (by C-G formula based on SCr of 0.39 mg/dL (L)).    No Known Allergies  Antimicrobials this admission: 11/6 Bactrim >> (11/8) 11/7 Vancomycin >> 11/8 Gentamicin >> 11/8 11/8 Meropenem >>  Microbiology results: 11/6 surgical wound cx E. Coli and Diptheroids - sens pending 11/1 Wound cx: few ESBL E. Coli 11/1 superficial wound cx: E.coli  Thank you for allowing pharmacy to be a part of this patient's care.  Alanda Slim, PharmD, [facility] Clinical Pharmacist Please see AMION for all Pharmacists' Contact Phone Numbers 01/09/2019, 2:27 PM

## 2019-01-09 NOTE — Progress Notes (Signed)
2035: Received call that pt's Vanc trough has come back as 39. Called Pharmacy and advised that it looks like dayshift hung Vanc before lab draw. Peak to be drawn tonight and another trough early AM.

## 2019-01-09 NOTE — Progress Notes (Signed)
[name], female   DOB: 12/31/1980, 38 y.o.   MRN: [id number] Doing well, wound vac in place Will continue with abx, but will change to gentamicin

## 2019-01-10 ENCOUNTER — Inpatient Hospital Stay: Payer: Self-pay

## 2019-01-10 ENCOUNTER — Encounter (HOSPITAL_COMMUNITY): Payer: Self-pay | Admitting: Anesthesiology

## 2019-01-10 DIAGNOSIS — B9689 Other specified bacterial agents as the cause of diseases classified elsewhere: Secondary | ICD-10-CM

## 2019-01-10 DIAGNOSIS — F17210 Nicotine dependence, cigarettes, uncomplicated: Secondary | ICD-10-CM

## 2019-01-10 DIAGNOSIS — L089 Local infection of the skin and subcutaneous tissue, unspecified: Secondary | ICD-10-CM

## 2019-01-10 DIAGNOSIS — G8220 Paraplegia, unspecified: Secondary | ICD-10-CM

## 2019-01-10 DIAGNOSIS — B9620 Unspecified Escherichia coli [E. coli] as the cause of diseases classified elsewhere: Secondary | ICD-10-CM

## 2019-01-10 DIAGNOSIS — X58XXXS Exposure to other specified factors, sequela: Secondary | ICD-10-CM

## 2019-01-10 DIAGNOSIS — Z978 Presence of other specified devices: Secondary | ICD-10-CM

## 2019-01-10 DIAGNOSIS — Z1612 Extended spectrum beta lactamase (ESBL) resistance: Secondary | ICD-10-CM

## 2019-01-10 DIAGNOSIS — S24104A Unspecified injury at T11-T12 level of thoracic spinal cord, initial encounter: Secondary | ICD-10-CM

## 2019-01-10 LAB — CBC
HCT: 37.6 % (ref 36.0–46.0)
Hemoglobin: 11.4 g/dL — ABNORMAL LOW (ref 12.0–15.0)
MCH: 27.5 pg (ref 26.0–34.0)
MCHC: 30.3 g/dL (ref 30.0–36.0)
MCV: 90.8 fL (ref 80.0–100.0)
Platelets: 626 10*3/uL — ABNORMAL HIGH (ref 150–400)
RBC: 4.14 MIL/uL (ref 3.87–5.11)
RDW: 15 % (ref 11.5–15.5)
WBC: 11.7 10*3/uL — ABNORMAL HIGH (ref 4.0–10.5)
nRBC: 0 % (ref 0.0–0.2)

## 2019-01-10 LAB — VANCOMYCIN, TROUGH: Vancomycin Tr: 17 ug/mL (ref 15–20)

## 2019-01-10 MED ORDER — VANCOMYCIN HCL IN DEXTROSE 750-5 MG/150ML-% IV SOLN
750.0000 mg | Freq: Three times a day (TID) | INTRAVENOUS | Status: DC
  Administered 2019-01-10: 750 mg via INTRAVENOUS
  Filled 2019-01-10 (×4): qty 150

## 2019-01-10 MED ORDER — DOXYCYCLINE HYCLATE 100 MG PO TABS
100.0000 mg | ORAL_TABLET | Freq: Two times a day (BID) | ORAL | Status: DC
  Administered 2019-01-10 – 2019-01-12 (×4): 100 mg via ORAL
  Filled 2019-01-10 (×4): qty 1

## 2019-01-10 MED ORDER — SODIUM CHLORIDE 0.9% FLUSH: 10.0000 mL | INTRAVENOUS | Status: DC | PRN

## 2019-01-10 MED ORDER — CEPHALEXIN 500 MG PO CAPS
500.0000 mg | ORAL_CAPSULE | Freq: Four times a day (QID) | ORAL | Status: DC
  Administered 2019-01-10: 500 mg via ORAL
  Filled 2019-01-10: qty 1

## 2019-01-10 MED ORDER — SODIUM CHLORIDE 0.9% FLUSH
10.0000 mL | Freq: Two times a day (BID) | INTRAVENOUS | Status: DC
  Administered 2019-01-10 – 2019-01-11 (×2): 10 mL

## 2019-01-10 MED ORDER — ERTAPENEM SODIUM 1 G IJ SOLR
1.0000 g | INTRAMUSCULAR | Status: DC
  Filled 2019-01-10 (×2): qty 1

## 2019-01-10 MED ORDER — SODIUM CHLORIDE 0.9 % IV SOLN
1.0000 g | INTRAVENOUS | Status: DC
  Administered 2019-01-10: 1000 mg via INTRAVENOUS
  Filled 2019-01-10 (×2): qty 1

## 2019-01-10 NOTE — Evaluation (Signed)
Physical Therapy Evaluation Patient Details Name: [name] MRN: [id number] DOB: 12-26-1980 Today's Date: 01/10/2019   History of Present Illness  Pt is 38 yo paraplegic since 2018 from [facility] presenting with wound infection. Pt s/p I&D to lumbar wound with wound vac placement. PMH: morbid obesity, depression, anxiety, neurogenic bowel PSH: lap chole, lap ovarian cystectomy, PLIF x4.  Clinical Impression  Pt admitted with above. Pt indep in w/c as pt is a paraplegic. Pt able to complete ant/post transfer to recliner with minA. Pt reports "I've never used a slide board, no one taught me." Pt interested in learning. Pt limited by pain and wound vac today but anticipate will progress well with mobility. Acute PT to follow.    Follow Up Recommendations No PT follow up;Supervision - Intermittent    Equipment Recommendations  None recommended by PT    Recommendations for Other Services       Precautions / Restrictions Precautions Precautions: Fall Precaution Comments: wound vac, paraplegic Restrictions Weight Bearing Restrictions: No      Mobility  Bed Mobility Overal bed mobility: Modified Independent             General bed mobility comments: pt pulled self up into long sit using bed rail, suspect difficulty without bed rails  Transfers Overall transfer level: Needs assistance   Transfers: Anterior-Posterior Transfer       Anterior-Posterior transfers: Min assist   General transfer comment: pt paraplegic and does anterior/posterior transfers in/out of her w/c at home. minA to prevent chair from moving and to help guide lines and placement of patient in alignment witht he chair. pt with increased back pain but was able to complete without physical assist  Ambulation/Gait             General Gait Details: non-ambulatory  Stairs            Wheelchair Mobility    Modified Rankin (Stroke Patients Only)       Balance Overall balance assessment:  Needs assistance Sitting-balance support: Bilateral upper extremity supported Sitting balance-Leahy Scale: Fair Sitting balance - Comments: pt in long sit stable, EOB not attempted       Standing balance comment: pt non ambulatory                             Pertinent Vitals/Pain Pain Assessment: 0-10 Pain Score: 8  Pain Location: lumbar wound site Pain Descriptors / Indicators: Sharp Pain Intervention(s): Premedicated before session    Home Living Family/patient expects to be discharged to:: Private residence Living Arrangements: Parent Available Help at Discharge: Family;Available 24 hours/day Type of Home: House Home Access: Level entry     Home Layout: One level Home Equipment: Wheelchair - manual      Prior Function Level of Independence: Needs assistance   Gait / Transfers Assistance Needed: pt non-ambulatory, uses manual w/c  ADL's / Homemaking Assistance Needed: assist with bathing back side        Hand Dominance   Dominant Hand: Right    Extremity/Trunk Assessment                Communication   Communication: No difficulties  Cognition Arousal/Alertness: Awake/alert Behavior During Therapy: WFL for tasks assessed/performed Overall Cognitive Status: Within Functional Limits for tasks assessed  General Comments: pt eager to get out of the bed      General Comments General comments (skin integrity, edema, etc.): pt with wound vac    Exercises     Assessment/Plan    PT Assessment Patient needs continued PT services  PT Problem List Decreased strength;Decreased activity tolerance;Decreased balance;Decreased mobility       PT Treatment Interventions DME instruction;Gait training;Stair training;Functional mobility training;Therapeutic activities;Therapeutic exercise;Balance training    PT Goals (Current goals can be found in the Care Plan section)  Acute Rehab PT Goals PT Goal  Formulation: With patient Time For Goal Achievement: 01/24/19 Potential to Achieve Goals: Good    Frequency Min 3X/week   Barriers to discharge        Co-evaluation               AM-PAC PT "6 Clicks" Mobility  Outcome Measure Help needed turning from your back to your side while in a flat bed without using bedrails?: None Help needed moving from lying on your back to sitting on the side of a flat bed without using bedrails?: None Help needed moving to and from a bed to a chair (including a wheelchair)?: A Little Help needed standing up from a chair using your arms (e.g., wheelchair or bedside chair)?: A Little Help needed to walk in hospital room?: Total Help needed climbing 3-5 steps with a railing? : Total 6 Click Score: 16    End of Session   Activity Tolerance: Patient tolerated treatment well Patient left: in chair;with call bell/phone within reach Nurse Communication: Mobility status(to complete anterior posterior transfer) PT Visit Diagnosis: Muscle weakness (generalized) (M62.81)    Time: [id number] PT Time Calculation (min) (ACUTE ONLY): 20 min   Charges:   PT Evaluation $PT Eval Moderate Complexity: 1 Mod          Kittie Plater, PT, DPT Acute Rehabilitation Services Pager #: [phone] Office #: [phone]   [name] 01/10/2019, 1:11 PM

## 2019-01-10 NOTE — Progress Notes (Signed)
Peripherally Inserted Central Catheter/Midline Placement  The IV Nurse has discussed with the patient and/or persons authorized to consent for the patient, the purpose of this procedure and the potential benefits and risks involved with this procedure.  The benefits include less needle sticks, lab draws from the catheter, and the patient may be discharged home with the catheter. Risks include, but not limited to, infection, bleeding, blood clot (thrombus formation), and puncture of an artery; nerve damage and irregular heartbeat and possibility to perform a PICC exchange if needed/ordered by physician.  Alternatives to this procedure were also discussed.  Bard Power PICC patient education guide, fact sheet on infection prevention and patient information card has been provided to patient /or left at bedside.    PICC/Midline Placement Documentation  PICC Single Lumen 0000000 PICC Left Basilic 45 cm 2 cm (Active)  Indication for Insertion or Continuance of Line Home intravenous therapies (PICC only) 01/10/19 1700  Exposed Catheter (cm) 2 cm 01/10/19 1700  Site Assessment Clean;Dry;Intact 01/10/19 1700  Line Status Flushed;Saline locked;Blood return noted 01/10/19 1700  Dressing Type Transparent;Restrictive 01/10/19 1700  Dressing Status Clean;Dry;Intact;Antimicrobial disc in place 01/10/19 1700  Dressing Change Due 01/17/19 01/10/19 1700       Darlyn Read 01/10/2019, 5:20 PM
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
## 2019-01-10 NOTE — Progress Notes (Signed)
Subjective: Patient reports pain in back, improved with current opioid regimen and no fevers, chills, N/V. overall doing ok  Objective: Vital signs in last 24 hours: Temp:  [98.2 F (36.8 C)-98.8 F (37.1 C)] 98.4 F (36.9 C) (11/08 2358) Pulse Rate:  [73-87] 77 (11/08 2358) Resp:  [8-20] 20 (11/08 2358) BP: (98-110)/(54-70) 107/59 (11/08 2358) SpO2:  [97 %-100 %] 99 % (11/08 2358)  Intake/Output from previous day: 11/08 0701 - 11/09 0700 In: 900 [P.O.:900] Out: 3300 [Urine:3300] Intake/Output this shift: No intake/output data recorded.  Neurologic: Grossly normal Resp: normal excursion, no wheezes Extremities: extremities normal, atraumatic, no cyanosis or edema Wound:vac in place  Lab Results: Recent Labs    01/07/19 1530 01/08/19 0226  WBC 13.7* 14.7*  HGB 12.9 12.1  HCT 41.8 38.5  PLT 714* 645*   BMET Recent Labs    01/07/19 1530  NA 134*  K 3.5  CL 97*  CO2 26  GLUCOSE 79  BUN <5*  CREATININE 0.39*  CALCIUM 8.9    Studies/Results: No results found.  Assessment/Plan: POD 3, s/p lumbar wound I&D, vac placement  LOS: 3 days  Continue ABX therapy due to Post-op infection will ask social work to start coordination for home health, wound care and vac change. cultures pending but will request ID consult for home abx regimen once cultures return   Bonna Gains 01/10/2019, 7:31 AM

## 2019-01-10 NOTE — Progress Notes (Signed)
PHARMACY CONSULT NOTE FOR:  OUTPATIENT  PARENTERAL ANTIBIOTIC THERAPY (OPAT)  Indication: Wound infection Regimen: Ertapenem 1 g q24h End date: 01/30/2019  IV antibiotic discharge orders are pended. To discharging provider:  please sign these orders via discharge navigator,  Select New Orders & click on the button choice - Manage This Unsigned Work.    Barth Kirks, PharmD, BCPS, BCCCP Clinical Pharmacist [phone]  Please check AMION for all [place] numbers  01/10/2019 2:30 PM

## 2019-01-10 NOTE — Progress Notes (Signed)
Pharmacy Antibiotic Note  [name] is a 38 y.o. female admitted on 01/07/2019 with wound infection.  Pharmacy has been consulted for Vancomycin dosing. S/P OR for debridement. WBC elevated. Renal function OK.   11/9 AM update:  AUC is above goal this AM  Plan: Dec vancomycin to 750 mg IV q8h >>New estimated AUC: 456 Now on Merrem as well Re-check vancomycin levels as needed   Height: 5\' 6"  (167.6 cm) Weight: 238 lb 1.6 oz (108 kg) IBW/kg (Calculated) : 59.3  Temp (24hrs), Avg:98.5 F (36.9 C), Min:98.2 F (36.8 C), Max:98.8 F (37.1 C)  Recent Labs  Lab 01/07/19 1530 01/08/19 0226 01/09/19 1853 01/09/19 2159 01/10/19 0122  WBC 13.7* 14.7*  --   --   --   CREATININE 0.39*  --   --   --   --   VANCOTROUGH  --   --  39*  --  17  VANCOPEAK  --   --   --  26*  --     Estimated Creatinine Clearance: 120.9 mL/min (A) (by C-G formula based on SCr of 0.39 mg/dL (L)).    No Known Allergies  Narda Bonds, PharmD, BCPS Clinical Pharmacist Phone: [phone]

## 2019-01-11 DIAGNOSIS — L89159 Pressure ulcer of sacral region, unspecified stage: Secondary | ICD-10-CM

## 2019-01-11 DIAGNOSIS — T8149XA Infection following a procedure, other surgical site, initial encounter: Secondary | ICD-10-CM

## 2019-01-11 DIAGNOSIS — Z95828 Presence of other vascular implants and grafts: Secondary | ICD-10-CM

## 2019-01-11 LAB — BASIC METABOLIC PANEL
Anion gap: 7 (ref 5–15)
BUN: 10 mg/dL (ref 6–20)
CO2: 28 mmol/L (ref 22–32)
Calcium: 8.7 mg/dL — ABNORMAL LOW (ref 8.9–10.3)
Chloride: 104 mmol/L (ref 98–111)
Creatinine, Ser: 0.8 mg/dL (ref 0.44–1.00)
GFR calc Af Amer: >60 mL/min (ref >60)
GFR calc non Af Amer: >60 mL/min (ref >60)
Glucose, Bld: 88 mg/dL (ref 70–99)
Potassium: 5.4 mmol/L — ABNORMAL HIGH (ref 3.5–5.1)
Sodium: 139 mmol/L (ref 135–145)

## 2019-01-11 MED ORDER — SODIUM CHLORIDE 0.9 % IV SOLN
1.0000 g | INTRAVENOUS | Status: AC
  Administered 2019-01-12: 1000 mg via INTRAVENOUS
  Filled 2019-01-11: qty 1

## 2019-01-11 MED ORDER — SODIUM CHLORIDE 0.9 % IV SOLN
1.0000 g | INTRAVENOUS | Status: DC
  Administered 2019-01-12: 1000 mg via INTRAVENOUS
  Filled 2019-01-11 (×3): qty 1

## 2019-01-11 NOTE — TOC Initial Note (Signed)
Transition of Care (TOC) - Initial/Assessment Note  Marvetta Gibbons RN,BSN Transitions of Care Unit 4NP (non trauma) - RN Case Manager [phone]   Patient Details  Name: [name] MRN: [id number] Date of Birth: Apr 13, 1980  [facility]) CM/SW Contact:    Dawayne Patricia, RN Phone Number: 01/11/2019, 3:10 PM  Clinical Narrative:                 Referral received for home wound VAC and home IV abx needs- wound VAC form faxed to MD office for signature- call made to [place] with [employer] wound VAC for home wound VAC needs- forms have been faxed into [employer] - pending approval - once approved home wound VAC will be delivered to bedside for transition home. - Cm spoke with pt at bedside- pt has assistance at home and confirmed active with [facility] and wants to continue services- discussed home wound VAC and home IV abx needs- pt agreeable to [employer] for home IV abx needs. Pam with [employer] aware of pt and needs and will f/u for education prior to discharge.      Expected Discharge Plan: [place] Barriers to Discharge: Continued Medical Work up   Patient Goals and CMS Choice  to return home and get better      Expected Discharge Plan and Services Expected Discharge Plan: [place] In-house Referral: Clinical Social Work Discharge Planning Services: CM Consult Post Acute Care Choice: Home Health, Resumption of Svcs/PTA Provider Living arrangements for the past 2 months: Apartment                 DME Arranged: Vac DME Agency: [employer]                  Prior Living Arrangements/Services Living arrangements for the past 2 months: Apartment                Current home services: Home RN    Activities of Daily Living Home Assistive Devices/Equipment: [facility] bed, [employer], Wheelchair ADL Screening (condition at time of admission) Patient's cognitive ability adequate to safely complete daily activities?: Yes Is  the patient deaf or have difficulty hearing?: No Does the patient have difficulty seeing, even when wearing glasses/contacts?: No Does the patient have difficulty concentrating, remembering, or making decisions?: No Patient able to express need for assistance with ADLs?: Yes Does the patient have difficulty dressing or bathing?: Yes Independently performs ADLs?: No Communication: Independent Dressing (OT): Needs assistance Is this a change from baseline?: Pre-admission baseline Grooming: Independent Feeding: Independent Bathing: Needs assistance Is this a change from baseline?: Pre-admission baseline Toileting: Needs assistance Is this a change from baseline?: Pre-admission baseline In/Out Bed: Needs assistance Is this a change from baseline?: Pre-admission baseline Walks in Home: Needs assistance Is this a change from baseline?: Pre-admission baseline Does the patient have difficulty walking or climbing stairs?: Yes Weakness of Legs: Both Weakness of Arms/Hands: None  Permission Sought/Granted                  Emotional Assessment              Admission diagnosis:  Wound infection [T14.8XXA, L08.9] Wound infection after surgery [T81.49XA] Patient Active Problem List   Diagnosis Date Noted  . Wound infection after surgery 01/07/2019  . Sepsis due to undetermined organism ([place]) 11/10/2018  . Acute pyelonephritis 11/10/2018  . Pressure ulcer   . Osteomyelitis of vertebra, sacral and sacrococcygeal region (  HCC)   . GERD (gastroesophageal reflux disease)   . Fever 11/07/2018  . Protein-calorie malnutrition, severe ([place]) 11/07/2018  . Acute lower UTI 05/05/2018  . Acute lower UTI (urinary tract infection) 05/04/2018  . Hypokalemia 05/04/2018  . AGE (acute gastroenteritis) 05/04/2018  . Chronic pain 05/04/2018  . Tinea   . Diarrhea   . 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 PICC (peripherally inserted central catheter) in place 07/22/2016  . Sacral  osteomyelitis ([place])   . Pressure injury of skin 06/11/2016  . Sepsis ([place]) 06/10/2016  . Normocytic anemia 06/10/2016  . Hyponatremia 06/10/2016  . 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 L1 vertebral fracture ([place]) 04/07/2016  . Depression with anxiety 06/06/2013  . Dysmenorrhea 08/09/2012  . Irregular menstrual cycle 08/09/2012  . Shoulder pain, left 11/07/2011  . H/O vitamin D deficiency 09/02/2011  . Obesity 08/06/2010  . Hypothyroidism 08/06/2010  . Smoking 08/06/2010  . Mental retardation, mild (I.Q. 50-70) 08/06/2010   PCP:  System, Pcp Not In Pharmacy:   [id number] - [place], [place] [address] [place] [address] Phone: [phone] Fax: [phone]     Social Determinants of Health (SDOH) Interventions    Readmission Risk Interventions Readmission Risk Prevention Plan 11/12/2018 11/08/2018  Transportation Screening - Complete  PCP or Specialist Appt within 3-5 Days Not Complete Not Complete  HRI or [place] - Complete  Social Work Consult for [place] Planning/Counseling - Complete  Palliative Care Screening Not Applicable Not Complete  Medication Review Press photographer) - Complete  Some recent data might be hidden

## 2019-01-11 NOTE — Progress Notes (Addendum)
Subjective: Patient denies new complaints at this time. States that she is just ready to get home.    Antibiotics:  Anti-infectives (From admission, onward)   Start     Dose/Rate Route Frequency Ordered Stop   01/10/19 2200  ertapenem [facility]) injection 1,000 mg  Status:  Discontinued     1 g Intramuscular Every 24 hours 01/10/19 1555 01/10/19 1654   01/10/19 2200  doxycycline (VIBRA-TABS) tablet 100 mg     100 mg Oral Every 12 hours 01/10/19 1555     01/10/19 2200  ertapenem (INVANZ) 1,000 mg in sodium chloride 0.9 % 100 mL IVPB     1 g 200 mL/hr over 30 Minutes Intravenous Every 24 hours 01/10/19 1654     01/10/19 1430  cephALEXin (KEFLEX) capsule 500 mg  Status:  Discontinued     500 mg Oral Every 6 hours 01/10/19 1426 01/10/19 1555   01/10/19 1000  vancomycin (VANCOCIN) IVPB 750 mg/150 ml premix  Status:  Discontinued     750 mg 150 mL/hr over 60 Minutes Intravenous Every 8 hours 01/10/19 0349 01/10/19 1426   01/09/19 1430  meropenem (MERREM) 1 g in sodium chloride 0.9 % 100 mL IVPB  Status:  Discontinued     1 g 200 mL/hr over 30 Minutes Intravenous Every 8 hours 01/09/19 1424 01/10/19 1555   01/09/19 1345  gentamicin (GARAMYCIN) 430 mg in dextrose 5 % 50 mL IVPB  Status:  Discontinued     4 mg/kg  108 kg 121.5 mL/hr over 30 Minutes Intravenous Every 24 hours 01/09/19 1338 01/09/19 1424   01/08/19 0300  vancomycin (VANCOCIN) IVPB 1000 mg/200 mL premix  Status:  Discontinued     1,000 mg 200 mL/hr over 60 Minutes Intravenous Every 8 hours 01/08/19 0201 01/10/19 0349   01/07/19 2200  sulfamethoxazole-trimethoprim (BACTRIM DS) 800-160 MG per tablet 1 tablet  Status:  Discontinued    Note to Pharmacy: For 10 days     1 tablet Oral Every 12 hours 01/07/19 2019 01/07/19 2036   01/07/19 2200  sulfamethoxazole-trimethoprim (BACTRIM DS) 800-160 MG per tablet 1 tablet  Status:  Discontinued    Note to Pharmacy: For 10 days     1 tablet Oral Every 12 hours 01/07/19 2036  01/09/19 1424      Medications: Scheduled Meds: . acetaminophen  1,000 mg Oral Once  . ALPRAZolam  0.25 mg Oral TID  . Chlorhexidine Gluconate Cloth  6 each Topical Daily  . cholecalciferol  1,000 Units Oral Daily  . citalopram  40 mg Oral Daily  . doxycycline  100 mg Oral Q12H  . ferrous sulfate  325 mg Oral Q breakfast  . gabapentin  600 mg Oral Q6H  . heparin injection (subcutaneous)  5,000 Units Subcutaneous Q8H  . levothyroxine  88 mcg Oral Q0600  . multivitamin with minerals  1 tablet Oral Daily  . nystatin ointment  1 application Topical BID  . oxybutynin  5 mg Oral Daily  . pantoprazole  40 mg Oral Daily  . potassium chloride SA  20 mEq Oral Daily  . scopolamine  1 patch Transdermal Q72H  . silver sulfADIAZINE   Topical Daily  . sodium chloride flush  10-40 mL Intracatheter Q12H  . vitamin B-12  1,000 mcg Oral Daily  . vitamin C  500 mg Oral BID   Continuous Infusions: . sodium chloride    . 0.9 % NaCl with KCl 20 mEq / L 80 mL/hr at  01/10/19 1256  . ertapenem 1,000 mg (01/10/19 2318)   PRN Meds:.acetaminophen **OR** acetaminophen, HYDROmorphone, methocarbamol, morphine injection, ondansetron **OR** ondansetron (ZOFRAN) IV, oxyCODONE, phenol, sodium chloride flush    Objective: Weight change:   Intake/Output Summary (Last 24 hours) at 01/11/2019 1128 Last data filed at 01/11/2019 0700 Gross per 24 hour  Intake 1830 ml  Output 5050 ml  Net -3220 ml   Blood pressure 128/60, pulse 77, temperature 98 F (36.7 C), temperature source Oral, resp. rate 20, height 5\' 6"  (1.676 m), weight 108 kg, last menstrual period 12/29/2018, SpO2 97 %. Temp:  [98 F (36.7 C)-98.5 F (36.9 C)] 98 F (36.7 C) (11/10 0814) Pulse Rate:  [72-79] 77 (11/10 0814) Resp:  [18-20] 20 (11/10 0814) BP: (98-128)/(60-70) 128/60 (11/10 0814) SpO2:  [97 %-99 %] 97 % (11/10 0814)  Physical Exam: General: Alert and awake, oriented x3, not in any acute distress. HEENT: anicteric sclera,  EOMI CVS regular rate, normal  Chest: , no wheezing, no respiratory distress Abdomen: soft non-distended,  Extremities: no edema or deformity noted bilaterally Skin: no rashes Neuro: nonfocal  CBC: CBC Latest Ref Rng & Units 01/10/2019 01/08/2019 01/07/2019  WBC 4.0 - 10.5 K/uL 11.7(H) 14.7(H) 13.7(H)  Hemoglobin 12.0 - 15.0 g/dL 11.4(L) 12.1 12.9  Hematocrit 36.0 - 46.0 % 37.6 38.5 41.8  Platelets 150 - 400 K/uL 626(H) 645(H) 714(H)      BMET Recent Labs    01/11/19 0426  NA 139  K 5.4*  CL 104  CO2 28  GLUCOSE 88  BUN 10  CREATININE 0.80  CALCIUM 8.7*     Liver Panel  No results for input(s): PROT, ALBUMIN, AST, ALT, ALKPHOS, BILITOT, BILIDIR, IBILI in the last 72 hours.     Sedimentation Rate No results for input(s): ESRSEDRATE in the last 72 hours. C-Reactive Protein No results for input(s): CRP in the last 72 hours.  Micro Results: Recent Results (from the past 720 hour(s))  SARS CORONAVIRUS 2 (TAT 6-24 HRS) Nasopharyngeal Nasopharyngeal Swab     Status: None   Collection Time: 12/21/18  7:24 AM   Specimen: Nasopharyngeal Swab  Result Value Ref Range Status   SARS Coronavirus 2 NEGATIVE NEGATIVE Final    Comment: (NOTE) SARS-CoV-2 target nucleic acids are NOT DETECTED. The SARS-CoV-2 RNA is generally detectable in upper and lower respiratory specimens during the acute phase of infection. Negative results do not preclude SARS-CoV-2 infection, do not rule out co-infections with other pathogens, and should not be used as the sole basis for treatment or other patient management decisions. Negative results must be combined with clinical observations, patient history, and epidemiological information. The expected result is Negative. Fact Sheet for Patients: SugarRoll.be Fact Sheet for Healthcare Providers: https://www.woods-mathews.com/ This test is not yet approved or cleared by the [place] FDA and  has been  authorized for detection and/or diagnosis of SARS-CoV-2 by FDA under an Emergency Use Authorization (EUA). This EUA will remain  in effect (meaning this test can be used) for the duration of the COVID-19 declaration under Section 56 4(b)(1) of the Act, 21 U.S.C. section 360bbb-3(b)(1), unless the authorization is terminated or revoked sooner. Performed at [facility], [place] [address] [place], [place] [address]   Wound or Superficial Culture     Status: None   Collection Time: 01/02/19  1:03 PM   Specimen: Back; Wound  Result Value Ref Range Status   Specimen Description   Final    BACK Performed at [facility],  [address], [place], [place] [address]    Special Requests   Final    Normal Performed at [facility], [address], [place], [place] [address]    Gram Stain   Final    NO WBC SEEN RARE GRAM POSITIVE COCCI RARE GRAM POSITIVE RODS Performed at [facility], [place] [address], [place], [place] [address]    Culture   Final    FEW ESCHERICHIA COLI Confirmed Extended Spectrum Beta-Lactamase Producer (ESBL).  In bloodstream infections from ESBL organisms, carbapenems are preferred over piperacillin/tazobactam. They are shown to have a lower risk of mortality.    Report Status 01/04/2019 FINAL  Final   Organism ID, Bacteria ESCHERICHIA COLI  Final      Susceptibility   Escherichia coli - MIC*    AMPICILLIN >=32 RESISTANT Resistant     CEFAZOLIN >=64 RESISTANT Resistant     CEFEPIME 16 INTERMEDIATE Intermediate     CEFTAZIDIME 16 INTERMEDIATE Intermediate     CEFTRIAXONE >=64 RESISTANT Resistant     CIPROFLOXACIN >=4 RESISTANT Resistant     GENTAMICIN <=1 SENSITIVE Sensitive     IMIPENEM <=0.25 SENSITIVE Sensitive     TRIMETH/SULFA >=320 RESISTANT Resistant     AMPICILLIN/SULBACTAM >=32 RESISTANT Resistant     PIP/TAZO 8 SENSITIVE Sensitive     Extended ESBL POSITIVE Resistant     * FEW ESCHERICHIA COLI  SARS Coronavirus 2 by RT PCR (hospital order,  performed in [facility] lab) Nasopharyngeal Nasopharyngeal Swab     Status: None   Collection Time: 01/02/19  2:05 PM   Specimen: Nasopharyngeal Swab  Result Value Ref Range Status   SARS Coronavirus 2 NEGATIVE NEGATIVE Final    Comment: (NOTE) If result is NEGATIVE SARS-CoV-2 target nucleic acids are NOT DETECTED. The SARS-CoV-2 RNA is generally detectable in upper and lower  respiratory specimens during the acute phase of infection. The lowest  concentration of SARS-CoV-2 viral copies this assay can detect is 250  copies / mL. A negative result does not preclude SARS-CoV-2 infection  and should not be used as the sole basis for treatment or other  patient management decisions.  A negative result may occur with  improper specimen collection / handling, submission of specimen other  than nasopharyngeal swab, presence of viral mutation(s) within the  areas targeted by this assay, and inadequate number of viral copies  (<250 copies / mL). A negative result must be combined with clinical  observations, patient history, and epidemiological information. If result is POSITIVE SARS-CoV-2 target nucleic acids are DETECTED. The SARS-CoV-2 RNA is generally detectable in upper and lower  respiratory specimens dur ing the acute phase of infection.  Positive  results are indicative of active infection with SARS-CoV-2.  Clinical  correlation with patient history and other diagnostic information is  necessary to determine patient infection status.  Positive results do  not rule out bacterial infection or co-infection with other viruses. If result is PRESUMPTIVE POSTIVE SARS-CoV-2 nucleic acids MAY BE PRESENT.   A presumptive positive result was obtained on the submitted specimen  and confirmed on repeat testing.  While 2019 novel coronavirus  (SARS-CoV-2) nucleic acids may be present in the submitted sample  additional confirmatory testing may be necessary for epidemiological  and / or  clinical management purposes  to differentiate between  SARS-CoV-2 and other Sarbecovirus currently known to infect humans.  If clinically indicated additional testing with an alternate test  methodology [phone]) is advised. The SARS-CoV-2 RNA is generally  detectable in upper and lower respiratory sp ecimens during the acute  phase of infection. The expected result is Negative. Fact Sheet for Patients:  StrictlyIdeas.no Fact Sheet for Healthcare Providers: BankingDealers.co.za This test is not yet approved or cleared by the [place] FDA and has been authorized for detection and/or diagnosis of SARS-CoV-2 by FDA under an Emergency Use Authorization (EUA).  This EUA will remain in effect (meaning this test can be used) for the duration of the COVID-19 declaration under Section 564(b)(1) of the Act, 21 U.S.C. section 360bbb-3(b)(1), unless the authorization is terminated or revoked sooner. Performed at [facility], [address], [place], [place] [address]   Aerobic/Anaerobic Culture (surgical/deep wound)     Status: None   Collection Time: 01/02/19  7:00 PM   Specimen: Wound  Result Value Ref Range Status   Specimen Description WOUND LUMBAR  Final   Special Requests NONE  Final   Gram Stain NO WBC SEEN NO ORGANISMS SEEN   Final   Culture   Final    FEW ESCHERICHIA COLI SUSCEPTIBILITIES PERFORMED ON PREVIOUS CULTURE WITHIN THE LAST 5 DAYS. NO ANAEROBES ISOLATED Performed at [facility], [place] [address] [place], [place] [address]    Report Status 01/07/2019 FINAL  Final  SARS CORONAVIRUS 2 (TAT 6-24 HRS) Nasopharyngeal Nasopharyngeal Swab     Status: None   Collection Time: 01/07/19  3:30 PM   Specimen: Nasopharyngeal Swab  Result Value Ref Range Status   SARS Coronavirus 2 NEGATIVE NEGATIVE Final    Comment: (NOTE) SARS-CoV-2 target nucleic acids are NOT DETECTED. The SARS-CoV-2 RNA is generally detectable in upper  and lower respiratory specimens during the acute phase of infection. Negative results do not preclude SARS-CoV-2 infection, do not rule out co-infections with other pathogens, and should not be used as the sole basis for treatment or other patient management decisions. Negative results must be combined with clinical observations, patient history, and epidemiological information. The expected result is Negative. Fact Sheet for Patients: SugarRoll.be Fact Sheet for Healthcare Providers: https://www.woods-mathews.com/ This test is not yet approved or cleared by the [place] FDA and  has been authorized for detection and/or diagnosis of SARS-CoV-2 by FDA under an Emergency Use Authorization (EUA). This EUA will remain  in effect (meaning this test can be used) for the duration of the COVID-19 declaration under Section 56 4(b)(1) of the Act, 21 U.S.C. section 360bbb-3(b)(1), unless the authorization is terminated or revoked sooner. Performed at [facility], [place] [address], [place], [place] [address]   Aerobic/Anaerobic Culture (surgical/deep wound)     Status: None (Preliminary result)   Collection Time: 01/07/19 11:14 PM   Specimen: Wound  Result Value Ref Range Status   Specimen Description WOUND  Final   Special Requests LUMBAR  Final   Gram Stain   Final    RARE WBC PRESENT, PREDOMINANTLY PMN RARE GRAM POSITIVE RODS Performed at [facility], [place] [address] [place],  [address]    Culture   Final    FEW ESCHERICHIA COLI Confirmed Extended Spectrum Beta-Lactamase Producer (ESBL).  In bloodstream infections from ESBL organisms, carbapenems are preferred over piperacillin/tazobactam. They are shown to have a lower risk of mortality. MODERATE DIPHTHEROIDS(CORYNEBACTERIUM SPECIES) Standardized susceptibility testing for this organism is not available. NO ANAEROBES ISOLATED; CULTURE IN PROGRESS FOR 5 DAYS    Report  Status PENDING  Incomplete   Organism ID, Bacteria ESCHERICHIA COLI  Final      Susceptibility  Escherichia coli - MIC*    AMPICILLIN >=32 RESISTANT Resistant     CEFAZOLIN >=64 RESISTANT Resistant     CEFEPIME >=64 RESISTANT Resistant     CEFTAZIDIME 16 INTERMEDIATE Intermediate     CEFTRIAXONE >=64 RESISTANT Resistant     CIPROFLOXACIN >=4 RESISTANT Resistant     GENTAMICIN <=1 SENSITIVE Sensitive     IMIPENEM <=0.25 SENSITIVE Sensitive     TRIMETH/SULFA >=320 RESISTANT Resistant     AMPICILLIN/SULBACTAM >=32 RESISTANT Resistant     PIP/TAZO 8 SENSITIVE Sensitive     Extended ESBL POSITIVE Resistant     * FEW ESCHERICHIA COLI    Studies/Results: [place] Ekg Site Rite  Result Date: 01/10/2019 If Site Rite image not attached, placement could not be confirmed due to current cardiac rhythm.   Assessment/Plan:   INTERVAL HISTORY:  Patient is on day 5 of antibiotic therapy (day 2 of meropenem & day 2 of cephalexin) for ESBL E coli and Corynebacterium diphtheroid sacral wound infection.. She is tolerating the antibiotics well. She had a PICC line placed yesterday and will be continued on Cephalexin and started on Ertapenem in the outpatient setting. Finish date 11/29. OPAT orders have been placed and follow up arranged at ID clinic in 3 weeks.    Active Problems:   Wound infection after surgery    [name] is a 38 y.o. female with a pertinent past medical history of paraplegia (T12 cord injury 2018), s/p implantation of IT baclofen pump on 10/21, s/p sacral wound debridement on 11/01 who presented to [facility] on 11/06 with increased pain with purulent discharge surrounding a new area of skin breakdown on her sacrum. Patient was taken for wound debridement with antibiotic washout on 11/07. Patient was started on vancomycin and bactrim. Wound cultures grew ESBL E. Coli and Corynebacterium diphtheroids. Currently has wound vac in place. ID team consulted for out patient antibiotic regimen.     LOS: 4 days   Marianna Payment 01/11/2019, 11:28 AM

## 2019-01-11 NOTE — Progress Notes (Signed)
Subjective: Patient reports no fevers, chills, N/V. overall doing ok; PICC placed yesterday. Infectious disease service saw and changed abx regimen.  Objective: Vital signs in last 24 hours: Temp:  [98.5 F (36.9 C)] 98.5 F (36.9 C) (11/09 2000) Pulse Rate:  [72-79] 72 (11/10 0036) Resp:  [18] 18 (11/10 0036) BP: (92-118)/(59-70) 118/70 (11/10 0036) SpO2:  [99 %] 99 % (11/10 0036)  Intake/Output from previous day: 11/09 0701 - 11/10 0700 In: 2028.7 [P.O.:820; I.V.:958.7; IV Piggyback:250] Out: [id number] [Urine:4850] Intake/Output this shift: No intake/output data recorded.  Neurologic: Grossly normal Resp: normal excursion, no wheezes Extremities: extremities normal, atraumatic, no cyanosis or edema Wound:vac in place  Lab Results: Recent Labs    01/10/19 0122  WBC 11.7*  HGB 11.4*  HCT 37.6  PLT 626*   BMET Recent Labs    01/11/19 0426  NA 139  K 5.4*  CL 104  CO2 28  GLUCOSE 88  BUN 10  CREATININE 0.80  CALCIUM 8.7*    Studies/Results: [place] Ekg Site Rite  Result Date: 01/10/2019 If Site Rite image not attached, placement could not be confirmed due to current cardiac rhythm.   Assessment/Plan: POD 4, s/p lumbar wound I&D, vac placement  LOS: 4 days  Continue ABX therapy due to Post-op infection PICC line placed. Appreciate ID recommendations and follow up.If we can coordinate VAC care and IV abx at home, will plan D/C with f/u in 3-4 weeks with [place], ID.     Bonna Gains 01/11/2019, 7:31 AM

## 2019-01-12 MED ORDER — ERTAPENEM IV (FOR PTA / DISCHARGE USE ONLY): 1.0000 g | INTRAVENOUS | 0 refills | Status: AC

## 2019-01-12 NOTE — Progress Notes (Signed)
Subjective: Patient reports no fevers, chills, N/V. overall doing ok; no side effects from medicines  Objective: Vital signs in last 24 hours: Temp:  [98 F (36.7 C)-98.3 F (36.8 C)] 98.2 F (36.8 C) (11/11 0338) Pulse Rate:  [68-80] 68 (11/11 0040) Resp:  [20] 20 (11/10 1950) BP: (102-132)/(60-88) 102/67 (11/11 0338) SpO2:  [97 %-99 %] 99 % (11/11 0338)  Intake/Output from previous day: 11/10 0701 - 11/11 0700 In: 1240 [P.O.:600; I.V.:640] Out: 4252 [Urine:4250; Stool:2] Intake/Output this shift: No intake/output data recorded.  Neurologic: Grossly normal Resp: normal excursion, no wheezes Extremities: extremities normal, atraumatic, no cyanosis or edema Wound:vac in place  Lab Results: Recent Labs    01/10/19 0122  WBC 11.7*  HGB 11.4*  HCT 37.6  PLT 626*   BMET Recent Labs    01/11/19 0426  NA 139  K 5.4*  CL 104  CO2 28  GLUCOSE 88  BUN 10  CREATININE 0.80  CALCIUM 8.7*    Studies/Results: [place] Ekg Site Rite  Result Date: 01/10/2019 If Site Rite image not attached, placement could not be confirmed due to current cardiac rhythm.   Assessment/Plan: POD 4, s/p lumbar wound I&D, vac placement  LOS: 5 days  Continue ABX therapy due to Post-op infection Awaiting approval for Home health/VAC dressings, stable o/w for d/c home with abx, wound care   Bonna Gains 01/12/2019, 7:28 AM

## 2019-01-12 NOTE — TOC Transition Note (Addendum)
Transition of Care (TOC) - CM/SW Discharge Note Marvetta Gibbons RN,BSN Transitions of Care Unit 4NP (non trauma) - RN Case Manager [phone]   Patient Details  Name: [name] MRN: [id number] Date of Birth: 07-Mar-1980  Transition of Care [facility]) CM/SW Contact:  Dawayne Patricia, RN Phone Number: 01/12/2019, 11:46 AM   Clinical Narrative:    Pt stable for transition home today, have received notice from [place] that pt has been approved for home wound VAC- VAC is pending delivery today- once delivered - bedside RN can change pt over to home [facility] for transition home. Have spoken to both Jeannene Patella and Mateo Flow for [facility] needs- Pam has completed education with pt for home IV abx needs- Mateo Flow will resume HHRN needs with [facility] for home IV abx and home wound VAC needs. Pt to return home with mom.   1330- per bedside RN- home wound VAC has been delivered- pt will get IV abx dose at 4pm this afternoon- requesting PTAR transport home- will arrange PTAR for 5pm transport pickup. Paperwork will be placed on shadowchart for PTAR.    Final next level of care: [place] Barriers to Discharge: Barriers Resolved   Patient Goals and CMS Choice Patient states their goals for this hospitalization and ongoing recovery are:: to return home and get better CMS Medicare.gov Compare Post Acute Care list provided to:: Patient Choice offered to / list presented to : Patient  Discharge Placement           Home with [facility].             Discharge Plan and Services In-house Referral: Clinical Social Work Discharge Planning Services: CM Consult Post Acute Care Choice: Home Health, Resumption of Svcs/PTA Provider          DME Arranged: Vac DME Agency: [facility] Date DME Agency Contacted: 01/11/19 Time DME Agency Contacted: 54 Representative spoke with at DME Agency: [place]: RN [place]: [name] ([place]) Date [place]: 01/11/19 Time [place]:  [place] Representative spoke with at [place]: [place] ([place]) Interventions     Readmission Risk Interventions Readmission Risk Prevention Plan 01/12/2019 11/12/2018 11/08/2018  Transportation Screening Complete - Complete  PCP or Specialist Appt within 3-5 Days Complete Not Complete Not Complete  HRI or Home Care Consult Complete - Complete  Social Work Consult for [place] Planning/Counseling Complete - Complete  Palliative Care Screening Not Applicable Not Applicable Not Complete  Medication Review Press photographer) Complete - Complete  Some recent data might be hidden

## 2019-01-12 NOTE — Progress Notes (Signed)
Home Portable Wound Vac Delivered and connected to patient. 18 Fr Foley catheter inserted, given the old one leaking. Tolerated. Given and explained d/c printed materials including follow up appointments and Home IV antibiotic script. Verbalized understanding and no further questions. Transferred out via stretcher by PTAR; alert and conversant. Elita Boone, BSN, RN

## 2019-01-12 NOTE — Progress Notes (Signed)
Transportation requested for 5:00 PM pick-up.  Laveda Abbe, [place] Clinical Social Worker [phone]

## 2019-01-12 NOTE — Discharge Summary (Signed)
Physician Discharge Summary  [name] MRN: [id number] DOB/AGE: Feb 05, 1981 38 y.o.  Admit date: 01/07/2019 Discharge date: 01/12/2019  Admission Diagnoses:Wound dehiscence, with probable infection  Discharge Diagnoses:  Active Problems:   Wound infection after surgery   Discharged Condition: good  Hospital Course:  Patient underwent  attempted intrathecal catheter placement on 12/22/2018, aborted because of inability to direct the catheter to an appropriate location.  She presented back to the hospital with wound dehiscence on 11/01, underwent I and D, with primary closure.   Unfortunately, her wound opened again, she presented back to the hospital on 11/06.   She went to the operating room with my partner Dr. Christella Noa, who opened the wound, debrided it, took cultures, and placed a VAC dressing.   After return of culture sensitivities, infectious Disease was consulted, and the recommendations have been noted.  A PICC line has been placed, and home health has been established for [facility] dressing changes  Until the wound heals by secondary intention, and IV antibiotics for the next 3 weeks.  Patient has had no fevers, chills or other signs of sepsis or infection.  She has been eating and drinking, voiding without abnormality.  She is stable and ready for discharge  Consults: ID  Significant Diagnostic Studies: microbiology: wound culture: positive for ESBL E. Coli and corynebacterium  Treatments: antibiotics:  Discharge Exam: Blood pressure 116/71, pulse 78, temperature 98.1 F (36.7 C), temperature source Oral, resp. rate 16, height '5\' 6"'  (1.676 m), weight 108 kg, last menstrual period 12/29/2018, SpO2 99 %. General appearance: alert, cooperative and moderately obese Head: Normocephalic, without obvious abnormality, atraumatic Back: symmetric, no curvature. ROM normal. No CVA tenderness., VAC dressing in place Extremities: extremities normal, atraumatic, no cyanosis or  edema Pulses: 2+ and symmetric Neurologic: Grossly normal  Disposition: Discharge disposition: 01-Home or Self Care       Discharge Instructions    Call MD for:  redness, tenderness, or signs of infection (pain, swelling, redness, odor or green/yellow discharge around incision site)   Complete by: As directed    Call MD for:  severe uncontrolled pain   Complete by: As directed    Call MD for:  temperature >100.4   Complete by: As directed    Diet - low sodium heart healthy   Complete by: As directed    Discharge wound care:   Complete by: As directed    Vac/dressing changes  With home health nursing   Home infusion instructions [place] May follow [place] Dosing Protocol; May administer Cathflo as needed to maintain patency of vascular access device.; Flushing of vascular access device: per [facility] Protocol: 0.9% NaCl pre/post medica...   Complete by: As directed    Instructions: May follow [place] Dosing Protocol   Instructions: May administer Cathflo as needed to maintain patency of vascular access device.   Instructions: Flushing of vascular access device: per [facility] Protocol: 0.9% NaCl pre/post medication administration and prn patency; Heparin 100 u/ml, 11m for implanted ports and Heparin 10u/ml, 58mfor all other central venous catheters.   Instructions: May follow [facility] Anaphylaxis Protocol for First Dose Administration in the home: 0.9% NaCl at 25-50 ml/hr to maintain IV access for protocol meds. Epinephrine 0.3 ml IV/IM PRN and Benadryl 25-50 IV/IM PRN s/s of anaphylaxis.   Instructions: [facility] Coordinator (RN) to assist per patient IV care needs in the home PRN.   Increase activity slowly   Complete by: As directed  Allergies as of 01/12/2019   No Known Allergies     Medication List    STOP taking these medications   clindamycin 150 MG capsule Commonly known as: Cleocin   HYDROmorphone 8 MG tablet Commonly known as: DILAUDID    sulfamethoxazole-trimethoprim 800-160 MG tablet Commonly known as: BACTRIM DS     TAKE these medications   acetaminophen 500 MG tablet Commonly known as: TYLENOL Take 1,000 mg by mouth daily as needed for moderate pain or headache.   ALPRAZolam 0.25 MG tablet Commonly known as: XANAX Take 0.25 mg by mouth 3 (three) times daily.   citalopram 40 MG tablet Commonly known as: CELEXA Take 40 mg by mouth daily.   ertapenem  IVPB Commonly known as: INVANZ Inject 1 g into the vein daily for 20 days. Indication:  Wound Infection Last Day of Therapy:  01/30/2019 Labs - Once weekly:  CBC/D and BMP, Labs - Every other week:  ESR and CRP   ferrous sulfate 325 (65 FE) MG tablet Take 325 mg by mouth daily with breakfast.   gabapentin 600 MG tablet Commonly known as: NEURONTIN Take 600 mg by mouth every 6 (six) hours.   levothyroxine 88 MCG tablet Commonly known as: SYNTHROID Take 88 mcg by mouth daily before breakfast.   methocarbamol 500 MG tablet Commonly known as: ROBAXIN Take 1,000 mg by mouth every 6 (six) hours as needed for muscle spasms.   multivitamin with minerals Tabs tablet Take 1 tablet by mouth daily.   nystatin ointment Commonly known as: MYCOSTATIN Apply 1 application topically 2 (two) times daily.   omeprazole 20 MG capsule Commonly known as: PRILOSEC Take 20 mg by mouth daily.   oxybutynin 5 MG 24 hr tablet Commonly known as: DITROPAN-XL Take 5 mg by mouth daily.   Oxycodone HCl 10 MG Tabs Take 10 mg by mouth every 6 (six) hours as needed for pain.   potassium chloride SA 20 MEQ tablet Commonly known as: KLOR-CON Take 1 tablet (20 mEq total) by mouth daily for 5 days.   vitamin B-12 1000 MCG tablet Commonly known as: CYANOCOBALAMIN Take 1,000 mcg by mouth daily.   vitamin C 500 MG tablet Commonly known as: ASCORBIC ACID Take 500 mg by mouth 2 (two) times daily.   VITAMIN D3 PO Take 1 capsule by mouth daily.            Home Infusion  Instuctions  (From admission, onward)         Start     Ordered   01/12/19 0000  Home infusion instructions [facility] May follow [place] Dosing Protocol; May administer Cathflo as needed to maintain patency of vascular access device.; Flushing of vascular access device: per [facility] Protocol: 0.9% NaCl pre/post medica...    Question Answer Comment  Instructions May follow [place] Dosing Protocol   Instructions May administer Cathflo as needed to maintain patency of vascular access device.   Instructions Flushing of vascular access device: per [facility] Protocol: 0.9% NaCl pre/post medication administration and prn patency; Heparin 100 u/ml, 4m for implanted ports and Heparin 10u/ml, 530mfor all other central venous catheters.   Instructions May follow [facility] Anaphylaxis Protocol for First Dose Administration in the home: 0.9% NaCl at 25-50 ml/hr to maintain IV access for protocol meds. Epinephrine 0.3 ml IV/IM PRN and Benadryl 25-50 IV/IM PRN s/s of anaphylaxis.   Instructions [facility] Infusion Coordinator (RN) to assist per patient IV care needs in the home PRN.  01/12/19 1142           Discharge Care Instructions  (From admission, onward)         Start     Ordered   01/12/19 0000  Discharge wound care:    Comments: Vac/dressing changes  With home health nursing   01/12/19 1142         Follow-up Information    [facility]-Home Follow up.   Specialty: [place] Why: [facility] arranged (wound VAC drsg changes and home IV abx)- [facility] wound VAC arranged and [facility] to work with [facility] for home IV abx needs          Signed: Bonna Gains 01/12/2019, 11:50 AM

## 2019-01-13 LAB — AEROBIC/ANAEROBIC CULTURE W GRAM STAIN (SURGICAL/DEEP WOUND)

## 2019-01-17 ENCOUNTER — Other Ambulatory Visit: Payer: Self-pay

## 2019-01-17 ENCOUNTER — Encounter (HOSPITAL_BASED_OUTPATIENT_CLINIC_OR_DEPARTMENT_OTHER): Payer: Medicaid Other | Attending: Internal Medicine | Admitting: Internal Medicine

## 2019-01-17 DIAGNOSIS — G8222 Paraplegia, incomplete: Secondary | ICD-10-CM | POA: Diagnosis not present

## 2019-01-17 DIAGNOSIS — L97811 Non-pressure chronic ulcer of other part of right lower leg limited to breakdown of skin: Secondary | ICD-10-CM | POA: Diagnosis not present

## 2019-01-17 DIAGNOSIS — L89154 Pressure ulcer of sacral region, stage 4: Secondary | ICD-10-CM | POA: Insufficient documentation

## 2019-01-17 DIAGNOSIS — Z8614 Personal history of Methicillin resistant Staphylococcus aureus infection: Secondary | ICD-10-CM | POA: Diagnosis not present
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
## 2019-01-20 NOTE — Progress Notes (Signed)
Jamie, [name] ([id number]) Visit Report for 01/17/2019 Arrival Information Details Patient Name: Date of Service: [name], [name] 01/17/2019 1:15 PM Medical Record [id number] Patient Account Number: [id number] Date of Birth/Sex: Treating RN: 05/03/1980 (38 y.o. Clearnce Sorrel Primary Care Tae Robak: SYSTEM, PCP Other Clinician: Referring Severina Sykora: Treating Karlyn Glasco/Extender:Robson, Forestine Chute, Lamar Blinks in Treatment: 133 Visit Information History Since Last Visit Added or deleted any medications: Yes Patient Arrived: Wheel Chair Any new allergies or adverse reactions: No Arrival Time: 14:09 Had a fall or experienced change in No activities of daily living that may affect Accompanied By: mother risk of falls: Transfer Assistance: Civil Service fast streamer Signs or symptoms of abuse/neglect since last No Patient Identification Verified: Yes visito Secondary Verification Process Completed: Yes Hospitalized since last visit: Yes Patient Requires Transmission-Based No Implantable device outside of the clinic excluding No Precautions: cellular tissue based products placed in the center Patient Has Alerts: No since last visit: Has Dressing in Place as Prescribed: Yes Pain Present Now: No Electronic Signature(s) Signed: 01/20/2019 6:05:31 PM By: Kela Millin Entered By: Kela Millin on 01/17/2019 14:10:41 -------------------------------------------------------------------------------- Clinic Level of Care Assessment Details Patient Name: Date of Service: Jamie, [name] 01/17/2019 1:15 PM Medical Record [id number] Patient Account Number: [id number] Date of Birth/Sex: Treating RN: 03/10/1980 (38 y.o. Nancy Fetter Primary Care Ramon Zanders: SYSTEM, PCP Other Clinician: Referring Ameliya Nicotra: Treating Raiford Fetterman/Extender:Robson, Forestine Chute, Lamar Blinks in Treatment: 133 Clinic Level of Care Assessment Items TOOL 4 Quantity Score X - Use  when only an EandM is performed on FOLLOW-UP visit 1 0 ASSESSMENTS - Nursing Assessment / Reassessment X - Reassessment of Co-morbidities (includes updates in patient status) 1 10 X - Reassessment of Adherence to Treatment Plan 1 5 ASSESSMENTS - Wound and Skin Assessment / Reassessment X - Simple Wound Assessment / Reassessment - one wound 1 5 _0  - Complex Wound Assessment / Reassessment - multiple wounds 0 _1  - Dermatologic / Skin Assessment (not related to wound area) 0 ASSESSMENTS - Focused Assessment _2  - Circumferential Edema Measurements - multi extremities 0 _3  - Nutritional Assessment / Counseling / Intervention 0 _4  - Lower Extremity Assessment (monofilament, tuning fork, pulses) 0 _5  - Peripheral Arterial Disease Assessment (using hand held doppler) 0 ASSESSMENTS - Ostomy and/or Continence Assessment and Care _6  - Incontinence Assessment and Management 0 _7  - Ostomy Care Assessment and Management (repouching, etc.) 0 PROCESS - Coordination of Care X - Simple Patient / Family Education for ongoing care 1 15 _8  - Complex (extensive) Patient / Family Education for ongoing care 0 X - Staff obtains Programmer, systems, Records, Test Results / Process Orders 1 10 X - Staff telephones HHA, Nursing Homes / Clarify orders / etc 1 10 _9  - Routine Transfer to another Facility (non-emergent condition) 0 _10  - Routine Hospital Admission (non-emergent condition) 0 _11  - New Admissions / Biomedical engineer / Ordering NPWT, Apligraf, etc. 0 _12  - Emergency Hospital Admission (emergent condition) 0 X - Simple Discharge Coordination 1 10 _13  - Complex (extensive) Discharge Coordination 0 PROCESS - Special Needs _14  - Pediatric / Minor Patient Management 0 _15  - Isolation Patient Management 0 _16  - Hearing / Language / Visual special needs 0 _17  - Assessment of Community assistance (transportation, D/C planning, etc.) 0 _18  - Additional assistance / Altered mentation 0 _19  - Support Surface(s) Assessment  (bed, cushion, seat, etc.) 0 INTERVENTIONS - Wound Cleansing / Measurement X - Simple Wound Cleansing - one wound 1 5 _20  - Complex Wound Cleansing - multiple wounds 0 X - Wound  Imaging (photographs - any number of wounds) 1 5 _0  - Wound Tracing (instead of photographs) 0 X - Simple Wound Measurement - one wound 1 5 _1  - Complex Wound Measurement - multiple wounds 0 INTERVENTIONS - Wound Dressings X - Small Wound Dressing one or multiple wounds 1 10 _2  - Medium Wound Dressing one or multiple wounds 0 _3  - Large Wound Dressing one or multiple wounds 0 X - Application of Medications - topical 1 5 <[id number]>_3<\/OVANVBTYOMAYOKHT>_9  - Application of Medications - injection 0 INTERVENTIONS - Miscellaneous _5  - External ear exam 0 _6  - Specimen Collection (cultures, biopsies, blood, body fluids, etc.) 0 _7  - Specimen(s) / Culture(s) sent or taken to Lab for analysis 0 X - Patient Transfer (multiple staff / Civil Service fast streamer / Similar devices) 1 10 _8  - Simple Staple / Suture removal (25 or less) 0 _9  - Complex Staple / Suture removal (26 or more) 0 _10  - Hypo / Hyperglycemic Management (close monitor of Blood Glucose) 0 _11  - Ankle / Brachial Index (ABI) - do not check if billed separately 0 X - Vital Signs 1 5 Has the patient been seen at the hospital within the last three years: Yes Total Score: 110 Level Of Care: New/Established - Level 3 Electronic Signature(s) Signed: 01/17/2019 5:59:11 PM By: Levan Hurst RN, BSN Entered By: Levan Hurst on 01/17/2019 14:50:40 -------------------------------------------------------------------------------- Encounter Discharge Information Details Patient Name: Date of Service: [name] 01/17/2019 1:15 PM Medical Record [id number] Patient Account Number: [id number] Date of Birth/Sex: Treating RN: 04/26/1980 (38 y.o. [name] Primary Care Jidenna Figgs: SYSTEM, PCP Other Clinician: Referring Margarie Mcguirt: Treating Taneya Conkel/Extender:Robson, Forestine Chute,  Lamar Blinks in Treatment: 133 Encounter Discharge Information Items Discharge Condition: Stable Ambulatory Status: Wheelchair Discharge Destination: Home Transportation: Private Auto Accompanied By: self Schedule Follow-up Appointment: Yes Clinical Summary of Care: Electronic Signature(s) Signed: 01/17/2019 5:37:24 PM By: Deon Pilling Entered By: Deon Pilling on 01/17/2019 15:12:19 -------------------------------------------------------------------------------- Lower Extremity Assessment Details Patient Name: Date of Service: TARAOLUWA, THAKUR 01/17/2019 1:15 PM Medical Record [id number] Patient Account Number: [id number] Date of Birth/Sex: Treating RN: 1981-01-08 (38 y.o. Clearnce Sorrel Primary Care Mareena Cavan: SYSTEM, PCP Other Clinician: Referring Rylynn Schoneman: Treating Ndeye Tenorio/Extender:Robson, Forestine Chute, Junius Roads Weeks in Treatment: 133 Electronic Signature(s) Signed: 01/20/2019 6:05:31 PM By: Kela Millin Entered By: Kela Millin on 01/17/2019 14:09:06 -------------------------------------------------------------------------------- Multi Wound Chart Details Patient Name: Date of Service: [name] 01/17/2019 1:15 PM Medical Record [id number] Patient Account Number: [id number] Date of Birth/Sex: Treating RN: 11-20-1980 (39 y.o. Nancy Fetter Primary Care Rylen Hou: SYSTEM, PCP Other Clinician: Referring Zahi Plaskett: Treating Kason Benak/Extender:Robson, Forestine Chute, Junius Roads Weeks in Treatment: 133 Vital Signs Height(in): 65 Pulse(bpm): 90 Weight(lbs): 201 Blood Pressure(mmHg): 142/86 Body Mass Index(BMI): 33 Temperature(F): 98.7 Respiratory 18 Rate(breaths/min): Photos: [1:No Photos] [N/A:N/A] Wound Location: [1:Sacrum - Medial] [N/A:N/A] Wounding Event: [1:Pressure Injury] [N/A:N/A] Primary Etiology: [1:Pressure Ulcer] [N/A:N/A] Comorbid History: [1:Anemia, Osteomyelitis] [N/A:N/A] Date Acquired: [1:05/15/2016]  [N/A:N/A] Weeks of Treatment: [1:133] [N/A:N/A] Wound Status: [1:Open] [N/A:N/A] Measurements L x W x D [1:1.6x0.9x2.2] [N/A:N/A] (cm) Area (cm) : [1:1.131] [N/A:N/A] Volume (cm) : [1:2.488] [N/A:N/A] % Reduction in Area: [1:96.60%] [N/A:N/A] % Reduction in Volume: [1:98.30%] [N/A:N/A] Classification: [1:Category/Stage IV] [N/A:N/A] Exudate Amount: [1:Medium] [N/A:N/A] Exudate Type: [1:Serosanguineous] [N/A:N/A] Exudate Color: [1:red, brown] [N/A:N/A] Wound Margin: [1:Epibole] [N/A:N/A] Granulation Amount: [1:Large (67-100%)] [N/A:N/A] Granulation Quality: [1:Pink, Pale] [N/A:N/A] Necrotic Amount: [1:Small (1-33%)] [N/A:N/A] Exposed Structures: [1:Fat Layer (Subcutaneous Tissue) Exposed: Yes Bone: Yes Fascia: No Tendon: No Muscle: No Joint: No Medium (34-66%)] [N/A:N/A N/A] Treatment Notes Electronic Signature(s)  Signed: 01/17/2019 5:46:17 PM By: Linton Ham MD Signed: 01/17/2019 5:59:11 PM By: Levan Hurst RN, BSN Entered By: Linton Ham on 01/17/2019 14:46:40 -------------------------------------------------------------------------------- Multi-Disciplinary Care Plan Details Patient Name: Date of Service: VIHANA, [name] 01/17/2019 1:15 PM Medical Record [id number] Patient Account Number: [id number] Date of Birth/Sex: Treating RN: 1981-02-03 (38 y.o. Nancy Fetter Primary Care Lucrezia Dehne: SYSTEM, PCP Other Clinician: Referring Bryssa Tones: Treating Niasha Devins/Extender:Robson, Forestine Chute, Junius Roads Weeks in Treatment: 133 Active Inactive Wound/Skin Impairment Nursing Diagnoses: Impaired tissue integrity Knowledge deficit related to smoking impact on wound healing Knowledge deficit related to ulceration/compromised skin integrity Goals: Patient will demonstrate a reduced rate of smoking or cessation of smoking Date Initiated: 06/27/2016 Date Inactivated: 01/26/2017 Target Resolution Date: 01/08/2017 Unmet Reason: pt continues Goal Status: Unmet to  smoke Patient/caregiver will verbalize understanding of skin care regimen Date Initiated: 06/27/2016 Target Resolution Date: 02/18/2019 Goal Status: Active Ulcer/skin breakdown will have a volume reduction of 50% by week 8 Date Initiated: 06/27/2016 Date Inactivated: 12/11/2016 Target Resolution Date: 07/25/2016 Goal Status: Met Interventions: Assess patient/caregiver ability to obtain necessary supplies Assess patient/caregiver ability to perform ulcer/skin care regimen upon admission and as needed Assess ulceration(s) every visit Provide education on smoking Provide education on ulcer and skin care Treatment Activities: Skin care regimen initiated : 06/27/2016 Topical wound management initiated : 06/27/2016 Notes: Electronic Signature(s) Signed: 01/17/2019 5:59:11 PM By: Levan Hurst RN, BSN Entered By: Levan Hurst on 01/17/2019 14:08:32 -------------------------------------------------------------------------------- Pain Assessment Details Patient Name: Date of Service: [name] 01/17/2019 1:15 PM Medical Record [id number] Patient Account Number: [id number] Date of Birth/Sex: Treating RN: 13-Apr-1980 (38 y.o. Clearnce Sorrel Primary Care Betzabeth Derringer: Other Clinician: SYSTEM, PCP Referring Cameryn Chrisley: Treating Lanissa Cashen/Extender:Robson, Forestine Chute, Junius Roads Weeks in Treatment: 133 Active Problems Location of Pain Severity and Description of Pain Patient Has Paino No Site Locations Pain Management and Medication Current Pain Management: Electronic Signature(s) Signed: 01/20/2019 6:05:31 PM By: Kela Millin Entered By: Kela Millin on 01/17/2019 14:11:42 -------------------------------------------------------------------------------- Patient/Caregiver Education Details Patient Name: Date of Service: [name] 11/16/2020andnbsp1:15 PM Medical Record Patient Account Number: [id number] Number: Treating RN: Levan Hurst Date of Birth/Gender: 1980-07-04 (39 y.o. F) Other Clinician: Primary Care Physician: SYSTEM, PCP Treating Linton Ham Referring Physician: Physician/Extender: Meade Maw Weeks in Treatment: 133 Education Assessment Education Provided To: Patient Education Topics Provided Smoking and Wound Healing: Methods: Explain/Verbal Responses: State content correctly Wound/Skin Impairment: Methods: Explain/Verbal Responses: State content correctly Electronic Signature(s) Signed: 01/17/2019 5:59:11 PM By: Levan Hurst RN, BSN Entered By: Levan Hurst on 01/17/2019 14:08:46 -------------------------------------------------------------------------------- Wound Assessment Details Patient Name: Date of Service: [name] 01/17/2019 1:15 PM Medical Record [id number] Patient Account Number: [id number] Date of Birth/Sex: Treating RN: August 17, 1980 (38 y.o. Clearnce Sorrel Primary Care Rickie Gange: SYSTEM, PCP Other Clinician: Referring Tyris Eliot: Treating Jasemine Nawaz/Extender:Robson, Forestine Chute, Junius Roads Weeks in Treatment: 133 Wound Status Wound Number: 1 Primary Etiology: Pressure Ulcer Wound Location: Sacrum - Medial Wound Status: Open Wounding Event: Pressure Injury Comorbid History: Anemia, Osteomyelitis Date Acquired: 05/15/2016 Weeks Of Treatment: 133 Clustered Wound: No Photos Wound Measurements Length: (cm) 1.6 Width: (cm) 0.9 Depth: (cm) 2.2 Area: (cm) 1.131 Volume: (cm) 2.488 Wound Description Classification: Category/Stage IV Wound Margin: Epibole Exudate Amount: Medium Exudate Type: Serosanguineous Exudate Color: red, brown Wound Bed Granulation Amount: Large (67-100%) Granulation Quality: Pink, Pale Necrotic Amount: Small (1-33%) Necrotic Quality: Adherent Slough After Cleansing: No brino Yes Exposed Structure Exposed: No yer (Subcutaneous Tissue) Exposed: Yes Exposed: No Exposed: No Exposed: No xposed: Yes %  Reduction in  Area: 96.6% % Reduction in Volume: 98.3% Epithelialization: Medium (34-66%) Tunneling: No Undermining: No Foul Odor Slough/Fi Fascia Fat La Tendon Muscle Joint Bone E Treatment Notes Wound #1 (Medial Sacrum) 1. Cleanse With Wound Cleanser 2. Periwound Care Skin Prep 3. Primary Dressing Applied Calcium Alginate Ag 4. Secondary Dressing Foam Border Dressing 5. Secured With Self Adhesive Bandage Notes applied wet to dry dressing to lower back as directed by MD. Electronic Signature(s) Signed: 01/19/2019 4:21:10 PM By: Mikeal Hawthorne EMT/HBOT Signed: 01/20/2019 6:05:31 PM By: Kela Millin Entered By: Mikeal Hawthorne on 01/19/2019 09:04:53 -------------------------------------------------------------------------------- Vitals Details Patient Name: Date of Service: [name] 01/17/2019 1:15 PM Medical Record [id number] Patient Account Number: [id number] Date of Birth/Sex: Treating RN: [birth year] (38 y.o. Clearnce Sorrel Primary Care Lelan Cush: SYSTEM, PCP Other Clinician: Referring Caralyn Twining: Treating Ashleyanne Hemmingway/Extender:Robson, Forestine Chute, Junius Roads Weeks in Treatment: 133 Vital Signs Time Taken: 14:00 Temperature (F): 98.7 Height (in): 65 Pulse (bpm): 90 Weight (lbs): 201 Respiratory Rate (breaths/min): 18 Body Mass Index (BMI): 33.4 Blood Pressure (mmHg): 142/86 Reference Range: 80 - 120 mg / dl Electronic Signature(s) Signed: 01/20/2019 6:05:31 PM By: Kela Millin Entered By: Kela Millin on 01/17/2019 14:25:21

## 2019-01-26 ENCOUNTER — Other Ambulatory Visit: Payer: Self-pay

## 2019-01-26 ENCOUNTER — Encounter: Payer: Self-pay | Admitting: Internal Medicine

## 2019-01-26 ENCOUNTER — Ambulatory Visit (INDEPENDENT_AMBULATORY_CARE_PROVIDER_SITE_OTHER): Payer: Medicaid Other | Admitting: Internal Medicine

## 2019-01-26 ENCOUNTER — Telehealth: Payer: Self-pay

## 2019-01-26 VITALS — BP 125/80 | HR 83 | Temp 98.3°F | Wt 240.0 lb

## 2019-01-26 DIAGNOSIS — K521 Toxic gastroenteritis and colitis: Secondary | ICD-10-CM | POA: Diagnosis not present

## 2019-01-26 DIAGNOSIS — T3695XA Adverse effect of unspecified systemic antibiotic, initial encounter: Secondary | ICD-10-CM

## 2019-01-26 DIAGNOSIS — Z452 Encounter for adjustment and management of vascular access device: Secondary | ICD-10-CM | POA: Diagnosis not present

## 2019-01-26 DIAGNOSIS — T8149XA Infection following a procedure, other surgical site, initial encounter: Secondary | ICD-10-CM | POA: Diagnosis not present

## 2019-01-26 MED ORDER — DIPHENOXYLATE-ATROPINE 2.5-0.025 MG PO TABS: 2.0000 | ORAL_TABLET | Freq: Four times a day (QID) | ORAL | 0 refills | Status: DC | PRN

## 2019-01-26 NOTE — Telephone Encounter (Signed)
Per Dr. Linus Salmons called [facility] with Orders to pull picc after last dose on 11/29. Spoke with Jackelyn Poling who will give orders to home health Rn. Patient is aware. [place]

## 2019-01-26 NOTE — Assessment & Plan Note (Signed)
Working well, no issues.  Will have this removed at the end of treatment on 11/29

## 2019-01-26 NOTE — Assessment & Plan Note (Signed)
Multiple stools.  Will try lomotil

## 2019-01-26 NOTE — Assessment & Plan Note (Signed)
This is improving well and down to a minimal size per patient report.  No concerns so will plan to finish the antibiotics on 11/29 and can stop. She will return here as needed.

## 2019-01-26 NOTE — Progress Notes (Signed)
   Subjective:    [name], female    DOB: 1981-02-06, 38 y.o.   MRN: [id number]  HPI Here for hsfu She has a history of paraplegia and implantation of IT baclofen pump done 10/21 by Dr. Lovenia Shuck.  Developed a wound infection and underwent debridement by Dr. Christella Noa on 11/7.  Growth in culture with ESBL E coli and diphtheroids and placed on ertapenem and doxycycline for 3 weeks through 11/29.     Review of Systems  Constitutional: Negative for chills, fever and unexpected weight change.  Gastrointestinal: Positive for diarrhea.  Skin: Negative for rash.       Objective:   Physical Exam Constitutional:      Appearance: Normal appearance.  Eyes:     General: No scleral icterus. Pulmonary:     Effort: Pulmonary effort is normal.  Skin:    Findings: No rash.  Neurological:     Mental Status: She is alert.   SH: + tobacco        Assessment & Plan:

## 2019-01-30 ENCOUNTER — Encounter (HOSPITAL_COMMUNITY): Payer: Self-pay | Admitting: Emergency Medicine

## 2019-01-30 ENCOUNTER — Other Ambulatory Visit: Payer: Self-pay

## 2019-01-30 ENCOUNTER — Emergency Department (HOSPITAL_COMMUNITY)
Admission: EM | Admit: 2019-01-30 | Discharge: 2019-01-31 | Disposition: A | Discharge status: Home / Self Care | Payer: Medicaid Other | Attending: Emergency Medicine | Admitting: Emergency Medicine

## 2019-01-30 ENCOUNTER — Emergency Department (HOSPITAL_COMMUNITY): Payer: Medicaid Other

## 2019-01-30 DIAGNOSIS — Z20828 Contact with and (suspected) exposure to other viral communicable diseases: Secondary | ICD-10-CM | POA: Insufficient documentation

## 2019-01-30 DIAGNOSIS — B349 Viral infection, unspecified: Secondary | ICD-10-CM | POA: Insufficient documentation

## 2019-01-30 DIAGNOSIS — Z79899 Other long term (current) drug therapy: Secondary | ICD-10-CM | POA: Insufficient documentation

## 2019-01-30 DIAGNOSIS — F17210 Nicotine dependence, cigarettes, uncomplicated: Secondary | ICD-10-CM | POA: Diagnosis not present

## 2019-01-30 DIAGNOSIS — E039 Hypothyroidism, unspecified: Secondary | ICD-10-CM | POA: Insufficient documentation

## 2019-01-30 DIAGNOSIS — R05 Cough: Secondary | ICD-10-CM | POA: Diagnosis present

## 2019-01-30 LAB — COMPREHENSIVE METABOLIC PANEL
ALT: 10 U/L (ref 0–44)
AST: 11 U/L — ABNORMAL LOW (ref 15–41)
Albumin: 3.4 g/dL — ABNORMAL LOW (ref 3.5–5.0)
Alkaline Phosphatase: 76 U/L (ref 38–126)
Anion gap: 9 (ref 5–15)
BUN: 11 mg/dL (ref 6–20)
CO2: 26 mmol/L (ref 22–32)
Calcium: 8.5 mg/dL — ABNORMAL LOW (ref 8.9–10.3)
Chloride: 104 mmol/L (ref 98–111)
Creatinine, Ser: 0.43 mg/dL — ABNORMAL LOW (ref 0.44–1.00)
GFR calc Af Amer: >60 mL/min (ref >60)
GFR calc non Af Amer: >60 mL/min (ref >60)
Glucose, Bld: 78 mg/dL (ref 70–99)
Potassium: 3.7 mmol/L (ref 3.5–5.1)
Sodium: 139 mmol/L (ref 135–145)
Total Bilirubin: 0.5 mg/dL (ref 0.3–1.2)
Total Protein: 6.8 g/dL (ref 6.5–8.1)

## 2019-01-30 LAB — URINALYSIS, ROUTINE W REFLEX MICROSCOPIC
Bilirubin Urine: NEGATIVE
Glucose, UA: NEGATIVE mg/dL
Ketones, ur: NEGATIVE mg/dL
Nitrite: NEGATIVE
Protein, ur: 30 mg/dL — AB
RBC / HPF: >50 RBC/hpf — ABNORMAL HIGH (ref 0–5)
Specific Gravity, Urine: 1.02 (ref 1.005–1.030)
pH: 6 (ref 5.0–8.0)

## 2019-01-30 LAB — CBC WITH DIFFERENTIAL/PLATELET
Abs Immature Granulocytes: 0.03 10*3/uL (ref 0.00–0.07)
Basophils Absolute: 0.1 10*3/uL (ref 0.0–0.1)
Basophils Relative: 0 %
Eosinophils Absolute: 0.5 10*3/uL (ref 0.0–0.5)
Eosinophils Relative: 5 %
HCT: 39.4 % (ref 36.0–46.0)
Hemoglobin: 12.1 g/dL (ref 12.0–15.0)
Immature Granulocytes: 0 %
Lymphocytes Relative: 32 %
Lymphs Abs: 3.8 10*3/uL (ref 0.7–4.0)
MCH: 26.8 pg (ref 26.0–34.0)
MCHC: 30.7 g/dL (ref 30.0–36.0)
MCV: 87.4 fL (ref 80.0–100.0)
Monocytes Absolute: 0.8 10*3/uL (ref 0.1–1.0)
Monocytes Relative: 7 %
Neutro Abs: 6.6 10*3/uL (ref 1.7–7.7)
Neutrophils Relative %: 56 %
Platelets: 541 10*3/uL — ABNORMAL HIGH (ref 150–400)
RBC: 4.51 MIL/uL (ref 3.87–5.11)
RDW: 14.6 % (ref 11.5–15.5)
WBC: 11.8 10*3/uL — ABNORMAL HIGH (ref 4.0–10.5)
nRBC: 0 % (ref 0.0–0.2)

## 2019-01-30 LAB — POC SARS CORONAVIRUS 2 AG -  ED: SARS Coronavirus 2 Ag: NEGATIVE

## 2019-01-30 LAB — PREGNANCY, URINE: Preg Test, Ur: NEGATIVE

## 2019-01-30 MED ORDER — PROMETHAZINE HCL 25 MG/ML IJ SOLN
12.5000 mg | Freq: Once | INTRAMUSCULAR | Status: AC
  Administered 2019-01-30: 12.5 mg via INTRAVENOUS
  Filled 2019-01-30: qty 1

## 2019-01-30 MED ORDER — ONDANSETRON HCL 4 MG/2ML IJ SOLN
4.0000 mg | Freq: Once | INTRAMUSCULAR | Status: AC
  Administered 2019-01-30: 4 mg via INTRAVENOUS
  Filled 2019-01-30: qty 2

## 2019-01-30 MED ORDER — MORPHINE SULFATE (PF) 4 MG/ML IV SOLN
4.0000 mg | Freq: Once | INTRAVENOUS | Status: AC
  Administered 2019-01-30: 18:00:00 4 mg via INTRAVENOUS
  Filled 2019-01-30: qty 1

## 2019-01-30 MED ORDER — PROMETHAZINE HCL 25 MG PO TABS: 25.0000 mg | ORAL_TABLET | Freq: Four times a day (QID) | ORAL | 0 refills | Status: DC | PRN

## 2019-01-30 MED ORDER — OXYCODONE-ACETAMINOPHEN 5-325 MG PO TABS
2.0000 | ORAL_TABLET | Freq: Once | ORAL | Status: AC
  Administered 2019-01-30: 2 via ORAL
  Filled 2019-01-30: qty 2

## 2019-01-30 NOTE — Discharge Instructions (Addendum)
Your lab tests are reassuring, including your rapid Covid test which is negative, but as discussed will need the confirmatory test to ensure this result is accurate which should be back by tomorrow.  Rest and make sure you drinking plenty of fluids.  Use the phenergan if needed for continued nausea.  You will need to maintain home isolation and wear a mask when others are around (like your home care nurse) until your Covid test is resulted and is negative.

## 2019-01-30 NOTE — ED Triage Notes (Signed)
Exposure to covid + patient on Thanksgiving. C/o nausea, HA and back pain.
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
## 2019-01-31 ENCOUNTER — Ambulatory Visit (HOSPITAL_BASED_OUTPATIENT_CLINIC_OR_DEPARTMENT_OTHER): Payer: Medicaid Other | Admitting: Internal Medicine

## 2019-01-31 LAB — SARS CORONAVIRUS 2 (TAT 6-24 HRS): SARS Coronavirus 2: NEGATIVE

## 2019-02-01 LAB — URINE CULTURE

## 2019-02-04 ENCOUNTER — Encounter (HOSPITAL_BASED_OUTPATIENT_CLINIC_OR_DEPARTMENT_OTHER): Payer: Medicaid Other | Admitting: Internal Medicine

## 2019-02-15 ENCOUNTER — Encounter (HOSPITAL_BASED_OUTPATIENT_CLINIC_OR_DEPARTMENT_OTHER): Payer: Medicaid Other | Admitting: Internal Medicine

## 2019-02-23 ENCOUNTER — Encounter (HOSPITAL_BASED_OUTPATIENT_CLINIC_OR_DEPARTMENT_OTHER): Payer: Medicaid Other | Admitting: Physician Assistant

## 2019-03-09 ENCOUNTER — Other Ambulatory Visit: Payer: Self-pay

## 2019-03-09 ENCOUNTER — Encounter (HOSPITAL_BASED_OUTPATIENT_CLINIC_OR_DEPARTMENT_OTHER): Payer: Medicaid Other | Attending: Physician Assistant | Admitting: Physician Assistant

## 2019-03-09 DIAGNOSIS — Z9049 Acquired absence of other specified parts of digestive tract: Secondary | ICD-10-CM | POA: Insufficient documentation

## 2019-03-09 DIAGNOSIS — L89154 Pressure ulcer of sacral region, stage 4: Secondary | ICD-10-CM | POA: Insufficient documentation

## 2019-03-09 DIAGNOSIS — G8222 Paraplegia, incomplete: Secondary | ICD-10-CM | POA: Diagnosis not present

## 2019-03-09 DIAGNOSIS — L97811 Non-pressure chronic ulcer of other part of right lower leg limited to breakdown of skin: Secondary | ICD-10-CM | POA: Diagnosis present

## 2019-03-09 DIAGNOSIS — L97529 Non-pressure chronic ulcer of other part of left foot with unspecified severity: Secondary | ICD-10-CM | POA: Insufficient documentation

## 2019-03-09 DIAGNOSIS — L89624 Pressure ulcer of left heel, stage 4: Secondary | ICD-10-CM | POA: Insufficient documentation

## 2019-03-09 DIAGNOSIS — L97312 Non-pressure chronic ulcer of right 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: Secondary | ICD-10-CM | POA: Diagnosis not present

## 2019-03-09 DIAGNOSIS — L97322 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: Secondary | ICD-10-CM | POA: Diagnosis not present

## 2019-03-09 NOTE — Progress Notes (Addendum)
Jamie, [name] ([id number]) Visit Report for 03/09/2019 Arrival Information Details Patient Name: Date of Service: [name], [name] 03/09/2019 12:30 PM Medical Record [id number] Patient Account Number: [id number] Date of Birth/Sex: Treating RN: October 09, 1980 (39 y.o. Helene Shoe, [name] Primary Care Nikola Blackston: SYSTEM, PCP Other Clinician: Referring Sophiah Rolin: Treating Krystl Wickware/Extender:Stone III, Jefferey Pica, Junius Roads Weeks in Treatment: 140 Visit Information History Since Last Visit Added or deleted any medications: No Patient Arrived: Wheel Chair Any new allergies or adverse reactions: No Arrival Time: 12:44 Had a fall or experienced change in No activities of daily living that may affect Accompanied By: family risk of falls: member Signs or symptoms of abuse/neglect since last No Transfer Assistance: None visito Patient Identification Verified: Yes Hospitalized since last visit: No Secondary Verification Process Yes Implantable device outside of the clinic excluding No Completed: cellular tissue based products placed in the center Patient Requires Transmission-Based No since last visit: Precautions: Has Dressing in Place as Prescribed: Yes Patient Has Alerts: No Pain Present Now: Yes Electronic Signature(s) Signed: 03/09/2019 5:41:11 PM By: Deon Pilling Entered By: Deon Pilling on 03/09/2019 12:46:29 -------------------------------------------------------------------------------- Clinic Level of Care Assessment Details Patient Name: Date of Service: Jamie, [name] 03/09/2019 12:30 PM Medical Record [id number] Patient Account Number: [id number] Date of Birth/Sex: Treating RN: 1980-12-20 (39 y.o. [name] Primary Care Aireonna Bauer: SYSTEM, PCP Other Clinician: Referring Valen Gillison: Treating Dae Antonucci/Extender:Stone III, Jefferey Pica, Junius Roads Weeks in Treatment: 140 Clinic Level of Care Assessment Items TOOL 4 Quantity Score _0  - Use when only an  EandM is performed on FOLLOW-UP visit 0 ASSESSMENTS - Nursing Assessment / Reassessment X - Reassessment of Co-morbidities (includes updates in patient status) 1 10 X - Reassessment of Adherence to Treatment Plan 1 5 ASSESSMENTS - Wound and Skin Assessment / Reassessment X - Simple Wound Assessment / Reassessment - one wound 1 5 _1  - Complex Wound Assessment / Reassessment - multiple wounds 0 _2  - Dermatologic / Skin Assessment (not related to wound area) 0 ASSESSMENTS - Focused Assessment _3  - Circumferential Edema Measurements - multi extremities 0 _4  - Nutritional Assessment / Counseling / Intervention 0 _5  - Lower Extremity Assessment (monofilament, tuning fork, pulses) 0 _6  - Peripheral Arterial Disease Assessment (using hand held doppler) 0 ASSESSMENTS - Ostomy and/or Continence Assessment and Care _7  - Incontinence Assessment and Management 0 _8  - Ostomy Care Assessment and Management (repouching, etc.) 0 PROCESS - Coordination of Care X - Simple Patient / Family Education for ongoing care 1 15 _9  - Complex (extensive) Patient / Family Education for ongoing care 0 X - Staff obtains Programmer, systems, Records, Test Results / Process Orders 1 10 X - Staff telephones HHA, Nursing Homes / Clarify orders / etc 1 10 _10  - Routine Transfer to another Facility (non-emergent condition) 0 _11  - Routine Hospital Admission (non-emergent condition) 0 _12  - New Admissions / Biomedical engineer / Ordering NPWT, Apligraf, etc. 0 _13  - Emergency Hospital Admission (emergent condition) 0 X - Simple Discharge Coordination 1 10 _14  - Complex (extensive) Discharge Coordination 0 PROCESS - Special Needs _15  - Pediatric / Minor Patient Management 0 _16  - Isolation Patient Management 0 _17  - Hearing / Language / Visual special needs 0 _18  - Assessment of Community assistance (transportation, D/C planning, etc.) 0 _19  - Additional assistance / Altered mentation 0 _20  - Support Surface(s) Assessment (bed, cushion,  seat, etc.) 0 INTERVENTIONS - Wound Cleansing / Measurement X - Simple Wound Cleansing - one wound 1 5 _21  - Complex Wound Cleansing - multiple wounds 0 X -  Wound Imaging (photographs - any number of wounds) 1 5 _0  - Wound Tracing (instead of photographs) 0 X - Simple Wound Measurement - one wound 1 5 _1  - Complex Wound Measurement - multiple wounds 0 INTERVENTIONS - Wound Dressings X - Small Wound Dressing one or multiple wounds 1 10 _2  - Medium Wound Dressing one or multiple wounds 0 _3  - Large Wound Dressing one or multiple wounds 0 X - Application of Medications - topical 1 5 <[id number]>_1<\/[id number]>_4  - Application of Medications - injection 0 INTERVENTIONS - Miscellaneous _5  - External ear exam 0 _6  - Specimen Collection (cultures, biopsies, blood, body fluids, etc.) 0 _7  - Specimen(s) / Culture(s) sent or taken to Lab for analysis 0 X - Patient Transfer (multiple staff / Civil Service fast streamer / Similar devices) 1 10 _8  - Simple Staple / Suture removal (25 or less) 0 _9  - Complex Staple / Suture removal (26 or more) 0 _10  - Hypo / Hyperglycemic Management (close monitor of Blood Glucose) 0 _11  - Ankle / Brachial Index (ABI) - do not check if billed separately 0 X - Vital Signs 1 5 Has the patient been seen at the hospital within the last three years: Yes Total Score: 110 Level Of Care: New/Established - Level 3 Electronic Signature(s) Signed: 03/09/2019 6:06:10 PM By: Baruch Gouty RN, BSN Entered By: Baruch Gouty on 03/09/2019 13:24:00 -------------------------------------------------------------------------------- Encounter Discharge Information Details Patient Name: Date of Service: [name] 03/09/2019 12:30 PM Medical Record [id number] Patient Account Number: [id number] Date of Birth/Sex: Treating RN: 1980-09-24 (39 y.o. Orvan Falconer Primary Care Jakyia Gaccione: SYSTEM, PCP Other Clinician: Referring Jancie Kercher: Treating Cosandra Plouffe/Extender:Stone III, Jefferey Pica, Junius Roads Weeks in Treatment:  140 Encounter Discharge Information Items Discharge Condition: Stable Ambulatory Status: Wheelchair Discharge Destination: Home Transportation: Private Auto Accompanied By: self Schedule Follow-up Appointment: Yes Clinical Summary of Care: Patient Declined Electronic Signature(s) Signed: 03/09/2019 5:42:41 PM By: Carlene Coria RN Entered By: Carlene Coria on 03/09/2019 13:43:46 -------------------------------------------------------------------------------- [place] Details Patient Name: Date of Service: [name] 03/09/2019 12:30 PM Medical Record [id number] Patient Account Number: [id number] Date of Birth/Sex: Treating RN: 07/07/1980 (39 y.o. [name] Primary Care Dinero Chavira: SYSTEM, PCP Other Clinician: Referring Treyten Monestime: Treating Tavyn Kurka/Extender:Stone III, Jefferey Pica, Junius Roads Weeks in Treatment: 140 Active Inactive Pressure Nursing Diagnoses: Knowledge deficit related to management of pressures ulcers Potential for impaired tissue integrity related to pressure, friction, moisture, and shear Goals: Patient will remain free from development of additional pressure ulcers Date Inactivated: 01/17/2019 Target10/23/2020 Resolution Date Initiated: 06/27/2016 Date: Goal Status: Met Patient/caregiver will verbalize risk factors for pressure ulcer development Date Initiated: 06/27/2016 Date Inactivated: 07/08/2017 Target Resolution Date: 07/08/2017 Goal Status: Met Patient/caregiver will verbalize understanding of pressure ulcer management Date Initiated: 06/27/2016 Target Resolution Date: 04/06/2019 Goal Status: Active Interventions: Assess: immobility, friction, shearing, incontinence upon admission and as needed Assess offloading mechanisms upon admission and as needed Assess potential for pressure ulcer upon admission and as needed Provide education on pressure ulcers Treatment Activities: Patient referred for pressure reduction/relief  devices : 06/27/2016 Pressure reduction/relief device ordered : 06/27/2016 Notes: Wound/Skin Impairment Nursing Diagnoses: Impaired tissue integrity Knowledge deficit related to smoking impact on wound healing Knowledge deficit related to ulceration/compromised skin integrity Goals: Patient will demonstrate a reduced rate of smoking or cessation of smoking Date Initiated: 06/27/2016 Date Inactivated: 01/26/2017 Target Resolution Date: 01/08/2017 Unmet Reason: pt continues Goal Status: Unmet to smoke Patient/caregiver will verbalize understanding of skin care regimen Date Initiated: 06/27/2016 Target Resolution Date: 04/06/2019 Goal Status: Active Ulcer/skin  breakdown will have a volume reduction of 50% by week 8 Date Initiated: 06/27/2016 Date Inactivated: 12/11/2016 Target Resolution Date: 07/25/2016 Goal Status: Met Interventions: Assess patient/caregiver ability to obtain necessary supplies Assess patient/caregiver ability to perform ulcer/skin care regimen upon admission and as needed Assess ulceration(s) every visit Provide education on smoking Provide education on ulcer and skin care Treatment Activities: Skin care regimen initiated : 06/27/2016 Topical wound management initiated : 06/27/2016 Notes: Electronic Signature(s) Signed: 03/09/2019 6:06:10 PM By: Baruch Gouty RN, BSN Entered By: Baruch Gouty on 03/09/2019 13:20:29 -------------------------------------------------------------------------------- Pain Assessment Details Patient Name: Date of Service: [name] 03/09/2019 12:30 PM Medical Record [id number] Patient Account Number: [id number] Date of Birth/Sex: Treating RN: Jan 01, 1981 (39 y.o. [name] Primary Care Kallen Delatorre: SYSTEM, PCP Other Clinician: Referring Metztli Sachdev: Treating Bessie Boyte/Extender:Stone III, Jefferey Pica, Junius Roads Weeks in Treatment: 140 Active Problems Location of Pain Severity and Description of Pain Patient Has Paino  Yes Site Locations Pain Location: Generalized Pain, Pain in Ulcers Rate the pain. Current Pain Level: 7 Worst Pain Level: 10 Least Pain Level: 0 Tolerable Pain Level: 8 Pain Management and Medication Current Pain Management: Medication: No Cold Application: No Rest: No Massage: No Activity: No T.E.N.S.: No Heat Application: No Leg drop or elevation: No Is the Current Pain Management Adequate: Adequate How does your wound impact your activities of daily livingo Sleep: No Bathing: No Appetite: No Relationship With Others: No Bladder Continence: No Emotions: No Bowel Continence: No Work: No Toileting: No Drive: No Dressing: No Hobbies: No Electronic Signature(s) Signed: 03/09/2019 5:41:11 PM By: Deon Pilling Entered By: Deon Pilling on 03/09/2019 12:47:55 -------------------------------------------------------------------------------- Patient/Caregiver Education Details Patient Name: Date of Service: [name] 1/6/2021andnbsp12:30 PM Medical Record Patient Account Number: [id number] Number: Treating RN: Baruch Gouty Date of Birth/Gender: Apr 09, 1980 (38 y.o. F) Other Clinician: Primary Care Physician: SYSTEM, PCP Treating Worthy Keeler Referring Physician: Physician/Extender: Meade Maw Weeks in Treatment: 140 Education Assessment Education Provided To: Patient Education Topics Provided Pressure: Methods: Explain/Verbal Responses: Reinforcements needed, State content correctly Wound/Skin Impairment: Methods: Explain/Verbal Responses: Reinforcements needed, State content correctly Electronic Signature(s) Signed: 03/09/2019 6:06:10 PM By: Baruch Gouty RN, BSN Entered By: Baruch Gouty on 03/09/2019 13:21:49 -------------------------------------------------------------------------------- Wound Assessment Details Patient Name: Date of Service: [name] 03/09/2019 12:30 PM Medical Record [id number] Patient  Account Number: [id number] Date of Birth/Sex: Treating RN: 1980/11/28 (39 y.o. [name] Primary Care Amarius Toto: SYSTEM, PCP Other Clinician: Referring Kenyana Husak: Treating Kosha Jaquith/Extender:Stone III, Jefferey Pica, Junius Roads Weeks in Treatment: 140 Wound Status Wound Number: 1 Primary Etiology: Pressure Ulcer Wound Location: Sacrum - Medial Wound Status: Open Wounding Event: Pressure Injury Comorbid History: Anemia, Osteomyelitis Date Acquired: 05/15/2016 Weeks Of Treatment: 140 Clustered Wound: No Photos Wound Measurements Length: (cm) 0.8 Width: (cm) 0.4 Depth: (cm) 1.4 Area: (cm) 0.251 Volume: (cm) 0.352 % Reduction in Area: 99.2% % Reduction in Volume: 99.8% Epithelialization: Medium (34-66%) Tunneling: No Undermining: Yes Starting Position (o'clock): 6 Ending Position (o'clock): 12 Maximum Distance: (cm) 1.7 Wound Description Classification: Category/Stage IV Wound Margin: Epibole Exudate Amount: Medium Exudate Type: Serosanguineous Exudate Color: red, brown Wound Bed Granulation Amount: Large (67-100%) Granulation Quality: Pink, Pale Necrotic Amount: None Present (0%) Foul Odor After Cleansing: No Slough/Fibrino Yes Exposed Structure Fascia Exposed: No Fat Layer (Subcutaneous Tissue) Exposed: Yes Tendon Exposed: No Muscle Exposed: No Joint Exposed: No Bone Exposed: No Treatment Notes Wound #1 (Medial Sacrum) 1. Cleanse With Wound Cleanser 3. Primary Dressing Applied Calcium Alginate Ag 4. Secondary Dressing Foam Border Dressing Notes applied  wet to dry dressing to lower back as directed by MD. Electronic Signature(s) Signed: 03/10/2019 4:21:35 PM By: Mikeal Hawthorne EMT/HBOT Signed: 03/10/2019 5:19:45 PM By: Deon Pilling Previous Signature: 03/09/2019 5:41:11 PM Version By: Deon Pilling Entered By: Mikeal Hawthorne on 03/10/2019 10:04:46 -------------------------------------------------------------------------------- Vitals Details Patient  Name: Date of Service: [name] 03/09/2019 12:30 PM Medical Record [id number] Patient Account Number: [id number] Date of Birth/Sex: Treating RN: 02/27/1981 (39 y.o. [name] Primary Care Ithzel Fedorchak: SYSTEM, PCP Other Clinician: Referring Nneoma Harral: Treating Adell Koval/Extender:Stone III, Jefferey Pica, Junius Roads Weeks in Treatment: 140 Vital Signs Time Taken: 12:47 Temperature (F): 98.5 Height (in): 65 Pulse (bpm): 88 Weight (lbs): 201 Respiratory Rate (breaths/min): 20 Body Mass Index (BMI): 33.4 Blood Pressure (mmHg): 124/89 Reference Range: 80 - 120 mg / dl Electronic Signature(s) Signed: 03/09/2019 5:41:11 PM By: Deon Pilling Entered By: Deon Pilling on 03/09/2019 12:47:36

## 2019-03-09 NOTE — Progress Notes (Addendum)
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 skin Facility Procedures CPT4 Code: [id number] Description: [facility] VISIT-LEV 3 EST PT Modifier: Quantity: 1 Physician Procedures CPT4 Code Description: [id number] [place] - [facility] PHYS LEVEL 4 - EST PT ICD-10 Diagnosis Description L89.154 Pressure ulcer of sacral region, stage 4 L97.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

## 2019-03-18 ENCOUNTER — Inpatient Hospital Stay (HOSPITAL_COMMUNITY)
Admission: EM | Admit: 2019-03-18 | Discharge: 2019-03-22 | DRG: 698 | Disposition: A | Discharge status: Home / Self Care | Payer: Medicaid Other | Attending: Internal Medicine | Admitting: Internal Medicine

## 2019-03-18 ENCOUNTER — Other Ambulatory Visit: Payer: Self-pay

## 2019-03-18 ENCOUNTER — Encounter (HOSPITAL_COMMUNITY): Payer: Self-pay | Admitting: Emergency Medicine

## 2019-03-18 DIAGNOSIS — N12 Tubulo-interstitial nephritis, not specified as acute or chronic: Secondary | ICD-10-CM | POA: Diagnosis present

## 2019-03-18 DIAGNOSIS — S34101S Unspecified injury to L1 level of lumbar spinal cord, sequela: Secondary | ICD-10-CM

## 2019-03-18 DIAGNOSIS — B9620 Unspecified Escherichia coli [E. coli] as the cause of diseases classified elsewhere: Secondary | ICD-10-CM | POA: Diagnosis present

## 2019-03-18 DIAGNOSIS — N390 Urinary tract infection, site not specified: Secondary | ICD-10-CM | POA: Diagnosis present

## 2019-03-18 DIAGNOSIS — Z8249 Family history of ischemic heart disease and other diseases of the circulatory system: Secondary | ICD-10-CM

## 2019-03-18 DIAGNOSIS — T83511A Infection and inflammatory reaction due to indwelling urethral catheter, initial encounter: Principal | ICD-10-CM | POA: Diagnosis present

## 2019-03-18 DIAGNOSIS — F17210 Nicotine dependence, cigarettes, uncomplicated: Secondary | ICD-10-CM | POA: Diagnosis present

## 2019-03-18 DIAGNOSIS — N319 Neuromuscular dysfunction of bladder, unspecified: Secondary | ICD-10-CM | POA: Diagnosis present

## 2019-03-18 DIAGNOSIS — E876 Hypokalemia: Secondary | ICD-10-CM | POA: Diagnosis not present

## 2019-03-18 DIAGNOSIS — D509 Iron deficiency anemia, unspecified: Secondary | ICD-10-CM | POA: Diagnosis present

## 2019-03-18 DIAGNOSIS — F419 Anxiety disorder, unspecified: Secondary | ICD-10-CM

## 2019-03-18 DIAGNOSIS — R32 Unspecified urinary incontinence: Secondary | ICD-10-CM

## 2019-03-18 DIAGNOSIS — Z87440 Personal history of urinary (tract) infections: Secondary | ICD-10-CM

## 2019-03-18 DIAGNOSIS — M25559 Pain in unspecified hip: Secondary | ICD-10-CM

## 2019-03-18 DIAGNOSIS — G8220 Paraplegia, unspecified: Secondary | ICD-10-CM | POA: Diagnosis present

## 2019-03-18 DIAGNOSIS — L89154 Pressure ulcer of sacral region, stage 4: Secondary | ICD-10-CM | POA: Diagnosis present

## 2019-03-18 DIAGNOSIS — K219 Gastro-esophageal reflux disease without esophagitis: Secondary | ICD-10-CM | POA: Diagnosis present

## 2019-03-18 DIAGNOSIS — N3001 Acute cystitis with hematuria: Secondary | ICD-10-CM

## 2019-03-18 DIAGNOSIS — E039 Hypothyroidism, unspecified: Secondary | ICD-10-CM | POA: Diagnosis present

## 2019-03-18 DIAGNOSIS — F329 Major depressive disorder, single episode, unspecified: Secondary | ICD-10-CM | POA: Diagnosis present

## 2019-03-18 DIAGNOSIS — Y846 Urinary catheterization as the cause of abnormal reaction of the patient, or of later complication, without mention of misadventure at the time of the procedure: Secondary | ICD-10-CM | POA: Diagnosis present

## 2019-03-18 DIAGNOSIS — S32019S Unspecified fracture of first lumbar vertebra, sequela: Secondary | ICD-10-CM

## 2019-03-18 DIAGNOSIS — M25552 Pain in left hip: Secondary | ICD-10-CM | POA: Diagnosis present

## 2019-03-18 DIAGNOSIS — T1490XS Injury, unspecified, sequela: Secondary | ICD-10-CM | POA: Diagnosis present

## 2019-03-18 DIAGNOSIS — Z6841 Body Mass Index (BMI) 40.0 and over, adult: Secondary | ICD-10-CM

## 2019-03-18 DIAGNOSIS — Z20822 Contact with and (suspected) exposure to covid-19: Secondary | ICD-10-CM | POA: Diagnosis present

## 2019-03-18 LAB — CBC WITH DIFFERENTIAL/PLATELET
Abs Immature Granulocytes: 0.04 10*3/uL (ref 0.00–0.07)
Basophils Absolute: 0.1 10*3/uL (ref 0.0–0.1)
Basophils Relative: 1 %
Eosinophils Absolute: 0.5 10*3/uL (ref 0.0–0.5)
Eosinophils Relative: 4 %
HCT: 42.9 % (ref 36.0–46.0)
Hemoglobin: 13.1 g/dL (ref 12.0–15.0)
Immature Granulocytes: 0 %
Lymphocytes Relative: 32 %
Lymphs Abs: 3.5 10*3/uL (ref 0.7–4.0)
MCH: 27.6 pg (ref 26.0–34.0)
MCHC: 30.5 g/dL (ref 30.0–36.0)
MCV: 90.5 fL (ref 80.0–100.0)
Monocytes Absolute: 0.8 10*3/uL (ref 0.1–1.0)
Monocytes Relative: 7 %
Neutro Abs: 6.2 10*3/uL (ref 1.7–7.7)
Neutrophils Relative %: 56 %
Platelets: 492 10*3/uL — ABNORMAL HIGH (ref 150–400)
RBC: 4.74 MIL/uL (ref 3.87–5.11)
RDW: 16.3 % — ABNORMAL HIGH (ref 11.5–15.5)
WBC: 11.1 10*3/uL — ABNORMAL HIGH (ref 4.0–10.5)
nRBC: 0 % (ref 0.0–0.2)

## 2019-03-18 LAB — COMPREHENSIVE METABOLIC PANEL
ALT: 20 U/L (ref 0–44)
AST: 13 U/L — ABNORMAL LOW (ref 15–41)
Albumin: 3.4 g/dL — ABNORMAL LOW (ref 3.5–5.0)
Alkaline Phosphatase: 79 U/L (ref 38–126)
Anion gap: 7 (ref 5–15)
BUN: 8 mg/dL (ref 6–20)
CO2: 31 mmol/L (ref 22–32)
Calcium: 8.9 mg/dL (ref 8.9–10.3)
Chloride: 100 mmol/L (ref 98–111)
Creatinine, Ser: 0.45 mg/dL (ref 0.44–1.00)
GFR calc Af Amer: >60 mL/min (ref >60)
GFR calc non Af Amer: >60 mL/min (ref >60)
Glucose, Bld: 72 mg/dL (ref 70–99)
Potassium: 3.3 mmol/L — ABNORMAL LOW (ref 3.5–5.1)
Sodium: 138 mmol/L (ref 135–145)
Total Bilirubin: 0.3 mg/dL (ref 0.3–1.2)
Total Protein: 6.7 g/dL (ref 6.5–8.1)

## 2019-03-18 LAB — URINALYSIS, ROUTINE W REFLEX MICROSCOPIC
Bilirubin Urine: NEGATIVE
Glucose, UA: NEGATIVE mg/dL
Ketones, ur: NEGATIVE mg/dL
Nitrite: POSITIVE — AB
Protein, ur: 100 mg/dL — AB
RBC / HPF: >50 RBC/hpf — ABNORMAL HIGH (ref 0–5)
Specific Gravity, Urine: 1.01 (ref 1.005–1.030)
WBC, UA: >50 WBC/hpf — ABNORMAL HIGH (ref 0–5)
pH: 8 (ref 5.0–8.0)

## 2019-03-18 LAB — RESPIRATORY PANEL BY RT PCR (FLU A&B, COVID)
Influenza A by PCR: NEGATIVE
Influenza B by PCR: NEGATIVE
SARS Coronavirus 2 by RT PCR: NEGATIVE

## 2019-03-18 MED ORDER — SODIUM CHLORIDE 0.9 % IV BOLUS
1000.0000 mL | Freq: Once | INTRAVENOUS | Status: AC
  Administered 2019-03-18: 1000 mL via INTRAVENOUS

## 2019-03-18 MED ORDER — ACETAMINOPHEN 325 MG PO TABS: 650.0000 mg | ORAL_TABLET | Freq: Four times a day (QID) | ORAL | Status: DC | PRN

## 2019-03-18 MED ORDER — ENOXAPARIN SODIUM 60 MG/0.6ML ~~LOC~~ SOLN
55.0000 mg | SUBCUTANEOUS | Status: DC
  Administered 2019-03-19 – 2019-03-22 (×4): 55 mg via SUBCUTANEOUS
  Filled 2019-03-18 (×5): qty 0.6

## 2019-03-18 MED ORDER — SODIUM CHLORIDE 0.9 % IV SOLN
1.0000 g | Freq: Once | INTRAVENOUS | Status: AC
  Administered 2019-03-18: 1 g via INTRAVENOUS
  Filled 2019-03-18: qty 10

## 2019-03-18 MED ORDER — ONDANSETRON 4 MG PO TBDP
4.0000 mg | ORAL_TABLET | Freq: Once | ORAL | Status: AC
  Administered 2019-03-18: 4 mg via ORAL
  Filled 2019-03-18: qty 1

## 2019-03-18 MED ORDER — ONDANSETRON HCL 4 MG/2ML IJ SOLN: 4.0000 mg | Freq: Four times a day (QID) | INTRAMUSCULAR | Status: DC | PRN

## 2019-03-18 MED ORDER — ENOXAPARIN SODIUM 40 MG/0.4ML ~~LOC~~ SOLN: 40.0000 mg | SUBCUTANEOUS | Status: DC

## 2019-03-18 MED ORDER — OXYCODONE-ACETAMINOPHEN 5-325 MG PO TABS
1.0000 | ORAL_TABLET | Freq: Once | ORAL | Status: AC
  Administered 2019-03-18: 1 via ORAL
  Filled 2019-03-18: qty 1

## 2019-03-18 MED ORDER — POTASSIUM CHLORIDE IN NACL 20-0.9 MEQ/L-% IV SOLN
INTRAVENOUS | Status: AC
  Filled 2019-03-18: qty 1000

## 2019-03-18 MED ORDER — ACETAMINOPHEN 650 MG RE SUPP: 650.0000 mg | Freq: Four times a day (QID) | RECTAL | Status: DC | PRN

## 2019-03-18 MED ORDER — HYDROMORPHONE HCL 1 MG/ML IJ SOLN
1.0000 mg | Freq: Once | INTRAMUSCULAR | Status: DC
  Filled 2019-03-18: qty 1

## 2019-03-18 MED ORDER — HYDROMORPHONE HCL 1 MG/ML IJ SOLN
1.0000 mg | Freq: Once | INTRAMUSCULAR | Status: AC
  Administered 2019-03-18: 1 mg via INTRAVENOUS

## 2019-03-18 MED ORDER — SODIUM CHLORIDE 0.9 % IV SOLN
1.0000 g | INTRAVENOUS | Status: DC
  Administered 2019-03-19 – 2019-03-21 (×3): 1 g via INTRAVENOUS
  Filled 2019-03-18 (×3): qty 10
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
## 2019-03-18 NOTE — ED Triage Notes (Signed)
Pt came due to a fever possible uti hx of the same. Has been trying to get in to her doctor but unable to. This has been going on for about 2 weeks.

## 2019-03-19 DIAGNOSIS — N390 Urinary tract infection, site not specified: Secondary | ICD-10-CM | POA: Diagnosis not present

## 2019-03-19 LAB — COMPREHENSIVE METABOLIC PANEL
ALT: 17 U/L (ref 0–44)
AST: 12 U/L — ABNORMAL LOW (ref 15–41)
Albumin: 3.2 g/dL — ABNORMAL LOW (ref 3.5–5.0)
Alkaline Phosphatase: 71 U/L (ref 38–126)
Anion gap: 8 (ref 5–15)
BUN: 9 mg/dL (ref 6–20)
CO2: 32 mmol/L (ref 22–32)
Calcium: 8.7 mg/dL — ABNORMAL LOW (ref 8.9–10.3)
Chloride: 102 mmol/L (ref 98–111)
Creatinine, Ser: 0.48 mg/dL (ref 0.44–1.00)
GFR calc Af Amer: >60 mL/min (ref >60)
GFR calc non Af Amer: >60 mL/min (ref >60)
Glucose, Bld: 95 mg/dL (ref 70–99)
Potassium: 3.6 mmol/L (ref 3.5–5.1)
Sodium: 142 mmol/L (ref 135–145)
Total Bilirubin: 0.4 mg/dL (ref 0.3–1.2)
Total Protein: 6.3 g/dL — ABNORMAL LOW (ref 6.5–8.1)

## 2019-03-19 LAB — CBC
HCT: 42.1 % (ref 36.0–46.0)
Hemoglobin: 12.7 g/dL (ref 12.0–15.0)
MCH: 27.4 pg (ref 26.0–34.0)
MCHC: 30.2 g/dL (ref 30.0–36.0)
MCV: 90.9 fL (ref 80.0–100.0)
Platelets: 451 10*3/uL — ABNORMAL HIGH (ref 150–400)
RBC: 4.63 MIL/uL (ref 3.87–5.11)
RDW: 16.4 % — ABNORMAL HIGH (ref 11.5–15.5)
WBC: 10.2 10*3/uL (ref 4.0–10.5)
nRBC: 0 % (ref 0.0–0.2)

## 2019-03-19 MED ORDER — PANTOPRAZOLE SODIUM 40 MG PO TBEC
40.0000 mg | DELAYED_RELEASE_TABLET | Freq: Every day | ORAL | Status: DC
  Administered 2019-03-19 – 2019-03-22 (×4): 40 mg via ORAL
  Filled 2019-03-19 (×4): qty 1

## 2019-03-19 MED ORDER — ALPRAZOLAM 0.25 MG PO TABS
0.2500 mg | ORAL_TABLET | Freq: Three times a day (TID) | ORAL | Status: DC
  Administered 2019-03-19 – 2019-03-22 (×11): 0.25 mg via ORAL
  Filled 2019-03-19 (×11): qty 1

## 2019-03-19 MED ORDER — CITALOPRAM HYDROBROMIDE 20 MG PO TABS
40.0000 mg | ORAL_TABLET | Freq: Every day | ORAL | Status: DC
  Administered 2019-03-19 – 2019-03-22 (×4): 40 mg via ORAL
  Filled 2019-03-19 (×6): qty 2

## 2019-03-19 MED ORDER — LEVOTHYROXINE SODIUM 88 MCG PO TABS
88.0000 ug | ORAL_TABLET | Freq: Every day | ORAL | Status: DC
  Administered 2019-03-19 – 2019-03-22 (×4): 88 ug via ORAL
  Filled 2019-03-19 (×7): qty 1

## 2019-03-19 MED ORDER — METHOCARBAMOL 500 MG PO TABS
1000.0000 mg | ORAL_TABLET | Freq: Four times a day (QID) | ORAL | Status: DC | PRN
  Administered 2019-03-19: 1000 mg via ORAL
  Filled 2019-03-19: qty 2

## 2019-03-19 MED ORDER — GABAPENTIN 300 MG PO CAPS
600.0000 mg | ORAL_CAPSULE | Freq: Four times a day (QID) | ORAL | Status: DC
  Administered 2019-03-19 – 2019-03-22 (×14): 600 mg via ORAL
  Filled 2019-03-19 (×14): qty 2

## 2019-03-19 MED ORDER — OXYBUTYNIN CHLORIDE ER 5 MG PO TB24
5.0000 mg | ORAL_TABLET | Freq: Every day | ORAL | Status: DC
  Administered 2019-03-19 – 2019-03-22 (×4): 5 mg via ORAL
  Filled 2019-03-19 (×6): qty 1

## 2019-03-19 MED ORDER — NICOTINE 14 MG/24HR TD PT24
14.0000 mg | MEDICATED_PATCH | Freq: Every day | TRANSDERMAL | Status: DC
  Administered 2019-03-19 – 2019-03-21 (×3): 14 mg via TRANSDERMAL
  Filled 2019-03-19 (×4): qty 1

## 2019-03-19 MED ORDER — VITAMIN B-12 1000 MCG PO TABS
1000.0000 ug | ORAL_TABLET | Freq: Every day | ORAL | Status: DC
  Administered 2019-03-19 – 2019-03-22 (×4): 1000 ug via ORAL
  Filled 2019-03-19 (×6): qty 1

## 2019-03-19 MED ORDER — ADULT MULTIVITAMIN W/MINERALS CH
1.0000 | ORAL_TABLET | Freq: Every day | ORAL | Status: DC
  Administered 2019-03-19 – 2019-03-22 (×4): 1 via ORAL
  Filled 2019-03-19 (×4): qty 1

## 2019-03-19 MED ORDER — FERROUS SULFATE 325 (65 FE) MG PO TABS
325.0000 mg | ORAL_TABLET | Freq: Every day | ORAL | Status: DC
  Administered 2019-03-19 – 2019-03-22 (×4): 325 mg via ORAL
  Filled 2019-03-19 (×4): qty 1

## 2019-03-19 MED ORDER — VITAMIN D 25 MCG (1000 UNIT) PO TABS
ORAL_TABLET | Freq: Every day | ORAL | Status: DC
  Filled 2019-03-19 (×3): qty 1

## 2019-03-19 MED ORDER — VITAMIN D 25 MCG (1000 UNIT) PO TABS
2000.0000 [IU] | ORAL_TABLET | Freq: Every day | ORAL | Status: DC
  Administered 2019-03-19 – 2019-03-21 (×3): 2000 [IU] via ORAL
  Filled 2019-03-19 (×5): qty 2

## 2019-03-19 MED ORDER — ASCORBIC ACID 500 MG PO TABS
500.0000 mg | ORAL_TABLET | Freq: Two times a day (BID) | ORAL | Status: DC
  Administered 2019-03-19 – 2019-03-22 (×8): 500 mg via ORAL
  Filled 2019-03-19 (×8): qty 1

## 2019-03-19 MED ORDER — OXYCODONE HCL 5 MG PO TABS
10.0000 mg | ORAL_TABLET | Freq: Four times a day (QID) | ORAL | Status: DC | PRN
  Administered 2019-03-19 – 2019-03-22 (×13): 10 mg via ORAL
  Filled 2019-03-19 (×14): qty 2

## 2019-03-19 NOTE — ED Notes (Signed)
Pt was given lunch tray.  

## 2019-03-19 NOTE — ED Notes (Signed)
Pt was given breakfast tray 
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
## 2019-03-20 DIAGNOSIS — N12 Tubulo-interstitial nephritis, not specified as acute or chronic: Secondary | ICD-10-CM | POA: Diagnosis present

## 2019-03-20 DIAGNOSIS — Z8249 Family history of ischemic heart disease and other diseases of the circulatory system: Secondary | ICD-10-CM | POA: Diagnosis not present

## 2019-03-20 DIAGNOSIS — F17210 Nicotine dependence, cigarettes, uncomplicated: Secondary | ICD-10-CM | POA: Diagnosis present

## 2019-03-20 DIAGNOSIS — D509 Iron deficiency anemia, unspecified: Secondary | ICD-10-CM | POA: Diagnosis present

## 2019-03-20 DIAGNOSIS — G8220 Paraplegia, unspecified: Secondary | ICD-10-CM | POA: Diagnosis not present

## 2019-03-20 DIAGNOSIS — N390 Urinary tract infection, site not specified: Secondary | ICD-10-CM | POA: Diagnosis not present

## 2019-03-20 DIAGNOSIS — T83511A Infection and inflammatory reaction due to indwelling urethral catheter, initial encounter: Secondary | ICD-10-CM | POA: Diagnosis not present

## 2019-03-20 DIAGNOSIS — F419 Anxiety disorder, unspecified: Secondary | ICD-10-CM | POA: Diagnosis present

## 2019-03-20 DIAGNOSIS — S34101S Unspecified injury to L1 level of lumbar spinal cord, sequela: Secondary | ICD-10-CM | POA: Diagnosis not present

## 2019-03-20 DIAGNOSIS — Z20822 Contact with and (suspected) exposure to covid-19: Secondary | ICD-10-CM | POA: Diagnosis present

## 2019-03-20 DIAGNOSIS — Y846 Urinary catheterization as the cause of abnormal reaction of the patient, or of later complication, without mention of misadventure at the time of the procedure: Secondary | ICD-10-CM | POA: Diagnosis present

## 2019-03-20 DIAGNOSIS — Z6841 Body Mass Index (BMI) 40.0 and over, adult: Secondary | ICD-10-CM | POA: Diagnosis not present

## 2019-03-20 DIAGNOSIS — F329 Major depressive disorder, single episode, unspecified: Secondary | ICD-10-CM | POA: Diagnosis present

## 2019-03-20 DIAGNOSIS — M25552 Pain in left hip: Secondary | ICD-10-CM | POA: Diagnosis present

## 2019-03-20 DIAGNOSIS — L89154 Pressure ulcer of sacral region, stage 4: Secondary | ICD-10-CM | POA: Diagnosis present

## 2019-03-20 DIAGNOSIS — B9620 Unspecified Escherichia coli [E. coli] as the cause of diseases classified elsewhere: Secondary | ICD-10-CM | POA: Diagnosis not present

## 2019-03-20 DIAGNOSIS — S32019S Unspecified fracture of first lumbar vertebra, sequela: Secondary | ICD-10-CM | POA: Diagnosis not present

## 2019-03-20 DIAGNOSIS — N319 Neuromuscular dysfunction of bladder, unspecified: Secondary | ICD-10-CM | POA: Diagnosis present

## 2019-03-20 DIAGNOSIS — K219 Gastro-esophageal reflux disease without esophagitis: Secondary | ICD-10-CM | POA: Diagnosis present

## 2019-03-20 DIAGNOSIS — E876 Hypokalemia: Secondary | ICD-10-CM | POA: Diagnosis not present

## 2019-03-20 DIAGNOSIS — E039 Hypothyroidism, unspecified: Secondary | ICD-10-CM | POA: Diagnosis not present

## 2019-03-20 DIAGNOSIS — Z87440 Personal history of urinary (tract) infections: Secondary | ICD-10-CM | POA: Diagnosis not present

## 2019-03-20 MED ORDER — CHLORHEXIDINE GLUCONATE CLOTH 2 % EX PADS
6.0000 | MEDICATED_PAD | Freq: Every day | CUTANEOUS | Status: DC
  Administered 2019-03-21: 6 via TOPICAL
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
## 2019-03-21 ENCOUNTER — Encounter (HOSPITAL_COMMUNITY): Payer: Self-pay | Admitting: Internal Medicine

## 2019-03-21 ENCOUNTER — Inpatient Hospital Stay (HOSPITAL_COMMUNITY): Payer: Medicaid Other

## 2019-03-21 DIAGNOSIS — M25552 Pain in left hip: Secondary | ICD-10-CM

## 2019-03-21 LAB — CBC WITH DIFFERENTIAL/PLATELET
Abs Immature Granulocytes: 0.02 10*3/uL (ref 0.00–0.07)
Basophils Absolute: 0 10*3/uL (ref 0.0–0.1)
Basophils Relative: 0 %
Eosinophils Absolute: 0.5 10*3/uL (ref 0.0–0.5)
Eosinophils Relative: 4 %
HCT: 40.5 % (ref 36.0–46.0)
Hemoglobin: 12.4 g/dL (ref 12.0–15.0)
Immature Granulocytes: 0 %
Lymphocytes Relative: 28 %
Lymphs Abs: 3.4 10*3/uL (ref 0.7–4.0)
MCH: 27.3 pg (ref 26.0–34.0)
MCHC: 30.6 g/dL (ref 30.0–36.0)
MCV: 89.2 fL (ref 80.0–100.0)
Monocytes Absolute: 0.8 10*3/uL (ref 0.1–1.0)
Monocytes Relative: 6 %
Neutro Abs: 7.3 10*3/uL (ref 1.7–7.7)
Neutrophils Relative %: 62 %
Platelets: 427 10*3/uL — ABNORMAL HIGH (ref 150–400)
RBC: 4.54 MIL/uL (ref 3.87–5.11)
RDW: 16.4 % — ABNORMAL HIGH (ref 11.5–15.5)
WBC: 12 10*3/uL — ABNORMAL HIGH (ref 4.0–10.5)
nRBC: 0 % (ref 0.0–0.2)

## 2019-03-21 LAB — RENAL FUNCTION PANEL
Albumin: 3.3 g/dL — ABNORMAL LOW (ref 3.5–5.0)
Anion gap: 8 (ref 5–15)
BUN: 7 mg/dL (ref 6–20)
CO2: 25 mmol/L (ref 22–32)
Calcium: 8.5 mg/dL — ABNORMAL LOW (ref 8.9–10.3)
Chloride: 104 mmol/L (ref 98–111)
Creatinine, Ser: 0.48 mg/dL (ref 0.44–1.00)
GFR calc Af Amer: >60 mL/min (ref >60)
GFR calc non Af Amer: >60 mL/min (ref >60)
Glucose, Bld: 83 mg/dL (ref 70–99)
Phosphorus: 3.4 mg/dL (ref 2.5–4.6)
Potassium: 3.1 mmol/L — ABNORMAL LOW (ref 3.5–5.1)
Sodium: 137 mmol/L (ref 135–145)

## 2019-03-21 LAB — MAGNESIUM: Magnesium: 2 mg/dL (ref 1.7–2.4)

## 2019-03-21 MED ORDER — POTASSIUM CHLORIDE CRYS ER 20 MEQ PO TBCR
40.0000 meq | EXTENDED_RELEASE_TABLET | Freq: Once | ORAL | Status: AC
  Administered 2019-03-21: 40 meq via ORAL
  Filled 2019-03-21: qty 2

## 2019-03-21 NOTE — Progress Notes (Addendum)
PROGRESS NOTE    [name]  [id number] DOB: 1980/05/03 DOA: 03/18/2019 PCP: System, Pcp Not In    Brief Narrative:  This lady was admitted with back pain, nausea, vomiting.  Urine is positive for gram-negative rods but we are awaiting identification and sensitivities.  She continues on intravenous Rocephin. She is now complaining of left hip pain and she is concerned about MRSA in the bones.   Assessment & Plan:   Principal Problem:   Acute lower UTI Active Problems:   Hypothyroidism   Post-traumatic paraplegia   Hypokalemia   GERD (gastroesophageal reflux disease)   Complicated UTI (urinary tract infection)   1.  UTI.  Await urine identification and sensitivities.  Continue with IV Rocephin.  Patient remains afebrile. 2.  Hypokalemia.  Replete potassium today. 3.  Left hip pain.  X-ray of the left hip today   DVT prophylaxis: Subcutaneous Lovenox. Code Status: Full code. Family Communication: None. Disposition Plan: Home, possibly tomorrow.   Consultants:   None.  Procedures:   None.  Antimicrobials:   IV Rocephin.   Subjective: She is complaining of left hip pain today.  Otherwise no fever.  Objective: Vitals:   03/20/19 1501 03/20/19 2148 03/21/19 0500 03/21/19 0551  BP: 110/61 116/72  116/76  Pulse: 91 74  74  Resp:  18  20  Temp: 98.7 F (37.1 C) 98.3 F (36.8 C)  97.8 F (36.6 C)  TempSrc: Oral Oral  Oral  SpO2: 97% 99%  96%  Weight:   113.5 kg   Height:        Intake/Output Summary (Last 24 hours) at 03/21/2019 1059 Last data filed at 03/21/2019 0515 Gross per 24 hour  Intake 480 ml  Output 1200 ml  Net -720 ml   Filed Weights   03/19/19 2120 03/20/19 0500 03/21/19 0500  Weight: 113.6 kg 113.6 kg 113.5 kg    Examination:  She looks systemically well.  She is afebrile.  She is hemodynamically stable.   Data Reviewed: I have personally reviewed following labs and imaging studies  CBC: Recent Labs  Lab  03/18/19 1948 03/19/19 0702 03/21/19 0604  WBC 11.1* 10.2 12.0*  NEUTROABS 6.2  --  7.3  HGB 13.1 12.7 12.4  HCT 42.9 42.1 40.5  MCV 90.5 90.9 89.2  PLT 492* 451* [id number]*   Basic Metabolic Panel: Recent Labs  Lab 03/18/19 1948 03/19/19 0702 03/21/19 0604  NA 138 142 137  K 3.3* 3.6 3.1*  CL 100 102 104  CO2 31 32 25  GLUCOSE 72 95 83  BUN 8 9 7   CREATININE 0.45 0.48 0.48  CALCIUM 8.9 8.7* 8.5*  MG  --   --  2.0  PHOS  --   --  3.4   GFR: Estimated Creatinine Clearance: 123.1 mL/min (by C-G formula based on SCr of 0.48 mg/dL). Liver Function Tests: Recent Labs  Lab 03/18/19 1948 03/19/19 0702 03/21/19 0604  AST 13* 12*  --   ALT 20 17  --   ALKPHOS 79 71  --   BILITOT 0.3 0.4  --   PROT 6.7 6.3*  --   ALBUMIN 3.4* 3.2* 3.3*   No results for input(s): LIPASE, AMYLASE in the last 168 hours. No results for input(s): AMMONIA in the last 168 hours. Coagulation Profile: No results for input(s): INR, PROTIME in the last 168 hours. Cardiac Enzymes: No results for input(s): CKTOTAL, CKMB, CKMBINDEX, TROPONINI in the last 168 hours. BNP (last 3 results) No results for  input(s): PROBNP in the last 8760 hours. HbA1C: No results for input(s): HGBA1C in the last 72 hours. CBG: No results for input(s): GLUCAP in the last 168 hours. Lipid Profile: No results for input(s): CHOL, HDL, LDLCALC, TRIG, CHOLHDL, LDLDIRECT in the last 72 hours. Thyroid Function Tests: No results for input(s): TSH, T4TOTAL, FREET4, T3FREE, THYROIDAB in the last 72 hours. Anemia Panel: No results for input(s): VITAMINB12, FOLATE, FERRITIN, TIBC, IRON, RETICCTPCT in the last 72 hours. Sepsis Labs: No results for input(s): PROCALCITON, LATICACIDVEN in the last 168 hours.  Recent Results (from the past 240 hour(s))  Culture, Urine     Status: Abnormal (Preliminary result)   Collection Time: 03/18/19  3:12 PM   Specimen: Urine, Clean Catch  Result Value Ref Range Status   Specimen Description    Final    URINE, CLEAN CATCH Performed at [facility], [address] [place], [place] [address]    Special Requests   Final    NONE Performed at [facility], [address] [place], [place] [address]    Culture (A)  Final    >=100,000 COLONIES/mL GRAM NEGATIVE RODS IDENTIFICATION AND SUSCEPTIBILITIES TO FOLLOW CULTURE REINCUBATED FOR BETTER GROWTH Performed at [facility], [place] [address], [place], [place] [address]    Report Status PENDING  Incomplete  Respiratory Panel by RT PCR (Flu A&B, Covid) - Nasopharyngeal Swab     Status: None   Collection Time: 03/18/19  7:49 PM   Specimen: Nasopharyngeal Swab  Result Value Ref Range Status   SARS Coronavirus 2 by RT PCR NEGATIVE NEGATIVE Final    Comment: (NOTE) SARS-CoV-2 target nucleic acids are NOT DETECTED. The SARS-CoV-2 RNA is generally detectable in upper respiratoy specimens during the acute phase of infection. The lowest concentration of SARS-CoV-2 viral copies this assay can detect is 131 copies/mL. A negative result does not preclude SARS-Cov-2 infection and should not be used as the sole basis for treatment or other patient management decisions. A negative result may occur with  improper specimen collection/handling, submission of specimen other than nasopharyngeal swab, presence of viral mutation(s) within the areas targeted by this assay, and inadequate number of viral copies (<131 copies/mL). A negative result must be combined with clinical observations, patient history, and epidemiological information. The expected result is Negative. Fact Sheet for Patients:  PinkCheek.be Fact Sheet for Healthcare Providers:  GravelBags.it This test is not yet ap proved or cleared by the [place] FDA and  has been authorized for detection and/or diagnosis of SARS-CoV-2 by FDA under an Emergency Use Authorization (EUA). This EUA will remain  in effect (meaning  this test can be used) for the duration of the COVID-19 declaration under Section 564(b)(1) of the Act, 21 U.S.C. section 360bbb-3(b)(1), unless the authorization is terminated or revoked sooner.    Influenza A by PCR NEGATIVE NEGATIVE Final   Influenza B by PCR NEGATIVE NEGATIVE Final    Comment: (NOTE) The Xpert Xpress SARS-CoV-2/FLU/RSV assay is intended as an aid in  the diagnosis of influenza from Nasopharyngeal swab specimens and  should not be used as a sole basis for treatment. Nasal washings and  aspirates are unacceptable for Xpert Xpress SARS-CoV-2/FLU/RSV  testing. Fact Sheet for Patients: PinkCheek.be Fact Sheet for Healthcare Providers: GravelBags.it This test is not yet approved or cleared by the [place] FDA and  has been authorized for detection and/or diagnosis of SARS-CoV-2 by  FDA under an Emergency Use Authorization (EUA). This EUA will remain  in effect (  meaning this test can be used) for the duration of the  Covid-19 declaration under Section 564(b)(1) of the Act, 21  U.S.C. section 360bbb-3(b)(1), unless the authorization is  terminated or revoked. Performed at [facility], [address], [place], [place] [address]          Radiology Studies: No results found.      Scheduled Meds: . ALPRAZolam  0.25 mg Oral TID  . vitamin C  500 mg Oral BID  . Chlorhexidine Gluconate Cloth  6 each Topical Q0600  . cholecalciferol  2,000 Units Oral Daily  . citalopram  40 mg Oral Daily  . enoxaparin (LOVENOX) injection  55 mg Subcutaneous Q24H  . ferrous sulfate  325 mg Oral Q breakfast  . gabapentin  600 mg Oral Q6H  . levothyroxine  88 mcg Oral QAC breakfast  . multivitamin with minerals  1 tablet Oral Daily  . nicotine  14 mg Transdermal Daily  . oxybutynin  5 mg Oral Daily  . pantoprazole  40 mg Oral Daily  . potassium chloride  40 mEq Oral Once  . vitamin B-12  1,000 mcg Oral Daily    Continuous Infusions: . cefTRIAXone (ROCEPHIN)  IV 1 g (03/20/19 2300)     LOS: 1 day        Keana Dueitt Luther Parody, MD [facility]   If 7PM-7AM, please contact night-coverage [email or web address]  03/21/2019, 10:59 AM

## 2019-03-21 NOTE — Consult Note (Signed)
   WOC Nurse Consult Note: Reason for Consult: pressure injury; chronic s/p paraplegia from 2018. Well known to our team.  Wound type: Stage 4 pressure injury; coccyx.  Pressure Injury POA: Yes Measurement: patient self reports about the size of an eraser head now, she is packing at home under the direction of the wound care center Has follow up scheduled there next week Drainage (amount, consistency, odor) moderate, serosanguinous per nursing flow sheet Periwound: Intact  Dressing procedure/placement/frequency: Cut a width of Aquacel Ag+ Kellie Simmering [phone]) narrow enough to stick down into the hole for the wound at the coccyx.  Leave a "tail" piece hanging out so that it can be removed. Cover with a small foam dressing. Change daily.  Patient is able to self turn her self to lessen pressure on the area.   Discussed POC with patient and bedside nurse.  Re consult if needed, will not follow at this time. Thanks  Johncarlos Holtsclaw [employer], RN,CWOCN, CNS, [place] [phone])

## 2019-03-22 DIAGNOSIS — B9620 Unspecified Escherichia coli [E. coli] as the cause of diseases classified elsewhere: Secondary | ICD-10-CM

## 2019-03-22 LAB — BASIC METABOLIC PANEL
Anion gap: 8 (ref 5–15)
BUN: 10 mg/dL (ref 6–20)
CO2: 28 mmol/L (ref 22–32)
Calcium: 8.4 mg/dL — ABNORMAL LOW (ref 8.9–10.3)
Chloride: 100 mmol/L (ref 98–111)
Creatinine, Ser: 0.44 mg/dL (ref 0.44–1.00)
GFR calc Af Amer: >60 mL/min (ref >60)
GFR calc non Af Amer: >60 mL/min (ref >60)
Glucose, Bld: 82 mg/dL (ref 70–99)
Potassium: 3.5 mmol/L (ref 3.5–5.1)
Sodium: 136 mmol/L (ref 135–145)

## 2019-03-22 LAB — URINE CULTURE: Culture: >=100000 — AB

## 2019-03-22 MED ORDER — CIPROFLOXACIN HCL 250 MG PO TABS
500.0000 mg | ORAL_TABLET | Freq: Two times a day (BID) | ORAL | Status: DC
  Administered 2019-03-22: 500 mg via ORAL
  Filled 2019-03-22: qty 2

## 2019-03-22 MED ORDER — CIPROFLOXACIN HCL 500 MG PO TABS: 500.0000 mg | ORAL_TABLET | Freq: Two times a day (BID) | ORAL | 0 refills | Status: DC

## 2019-03-22 NOTE — Discharge Summary (Signed)
Physician Discharge Summary  [name] [id number] DOB: [birth year] DOA: 03/18/2019  PCP: System, Pcp Not In  Admit date: 03/18/2019 Discharge date: 03/22/2019  Admitted From: Home Disposition: Home  Recommendations for Outpatient Follow-up:  1. Follow up with PCP in 1-2 weeks 2. Please follow up on the following pending results:     Discharge Condition: Stable CODE STATUS: Full code   Brief/Interim Summary: This 39 year old lady was admitted with back pain, nausea and vomiting.  Urine culture was positive for E. coli and we are still awaiting identification and sensitivities but she has responded well to intravenous Rocephin.  She was complaining of left hip pain and hip x-ray was negative. She is feeling much improved and will be sent home on a 5-day course of ciprofloxacin.  I recommend that she follow-up with her primary care physician to repeat urine culture.  Since this  Discharge Diagnoses:  Principal Problem:   Acute lower UTI Active Problems:   Hypothyroidism   Post-traumatic paraplegia   Hypokalemia   GERD (gastroesophageal reflux disease)   Complicated UTI (urinary tract infection)    Discharge Instructions  Discharge Instructions    Diet - low sodium heart healthy   Complete by: As directed    Increase activity slowly   Complete by: As directed      Allergies as of 03/22/2019   No Known Allergies     Medication List    TAKE these medications   acetaminophen 500 MG tablet Commonly known as: TYLENOL Take 1,000 mg by mouth daily as needed for moderate pain or headache.   ALPRAZolam 0.25 MG tablet Commonly known as: XANAX Take 0.25 mg by mouth 3 (three) times daily.   ciprofloxacin 500 MG tablet Commonly known as: CIPRO Take 1 tablet (500 mg total) by mouth 2 (two) times daily.   citalopram 40 MG tablet Commonly known as: CELEXA Take 40 mg by mouth daily.   ferrous sulfate 325 (65 FE) MG tablet Take 325 mg by mouth daily with  breakfast.   gabapentin 600 MG tablet Commonly known as: NEURONTIN Take 600 mg by mouth every 6 (six) hours.   levothyroxine 88 MCG tablet Commonly known as: SYNTHROID Take 88 mcg by mouth daily before breakfast.   methocarbamol 500 MG tablet Commonly known as: ROBAXIN Take 1,000 mg by mouth every 6 (six) hours as needed for muscle spasms.   multivitamin with minerals Tabs tablet Take 1 tablet by mouth daily.   omeprazole 20 MG capsule Commonly known as: PRILOSEC Take 20 mg by mouth daily.   oxybutynin 5 MG 24 hr tablet Commonly known as: DITROPAN-XL Take 5 mg by mouth daily.   Oxycodone HCl 10 MG Tabs Take 10 mg by mouth every 6 (six) hours as needed for pain.   vitamin B-12 1000 MCG tablet Commonly known as: CYANOCOBALAMIN Take 1,000 mcg by mouth daily.   vitamin C 500 MG tablet Commonly known as: ASCORBIC ACID Take 500 mg by mouth 2 (two) times daily.   Vitamin D3 50 MCG (2000 UT) Tabs Take 1 tablet by mouth daily.         Consultations:  None   Procedures/Studies: DG HIP UNILAT WITH PELVIS 2-3 VIEWS LEFT  Result Date: 03/21/2019 CLINICAL DATA:  Back and left hip pain. EXAM: DG HIP (WITH OR WITHOUT PELVIS) 2-3V LEFT COMPARISON:  CT scan 11/07/2018 FINDINGS: Both hips are normally located. No significant degenerative changes or acute bony findings. No definite plain film findings for AVN. The pubic symphysis and  SI joints are intact. No pelvic fractures or bone lesions. IMPRESSION: No acute bony findings or significant degenerative changes. Electronically Signed   By: Marijo Sanes M.D.   On: 03/21/2019 15:37       Subjective: She feels well today's with no complaints.   Discharge Exam: Vitals:   03/21/19 2051 03/22/19 0500 03/22/19 0600 03/22/19 0758  BP: 93/79  (!) 114/54   Pulse: 79  77   Resp: 20  16   Temp: 98.5 F (36.9 C)  98.2 F (36.8 C)   TempSrc: Oral  Oral   SpO2: 98%  100% 98%  Weight:  114.1 kg    Height:        She looks  systemically well.  She is afebrile.  Hemodynamically stable.  No new physical findings.    The results of significant diagnostics from this hospitalization (including imaging, microbiology, ancillary and laboratory) are listed below for reference.     Microbiology: Recent Results (from the past 240 hour(s))  Culture, Urine     Status: Abnormal (Preliminary result)   Collection Time: 03/18/19  3:12 PM   Specimen: Urine, Clean Catch  Result Value Ref Range Status   Specimen Description   Final    URINE, CLEAN CATCH Performed at [facility], [address] [place], [place] [address]    Special Requests   Final    NONE Performed at [facility], [address] [place], [place] [address]    Culture (A)  Final    >=100,000 COLONIES/mL ESCHERICHIA COLI >=100,000 COLONIES/mL MORGANELLA MORGANII SUSCEPTIBILITIES TO FOLLOW Performed at [facility] [address], [place], [place] [address]    Report Status PENDING  Incomplete  Respiratory Panel by RT PCR (Flu A&B, Covid) - Nasopharyngeal Swab     Status: None   Collection Time: 03/18/19  7:49 PM   Specimen: Nasopharyngeal Swab  Result Value Ref Range Status   SARS Coronavirus 2 by RT PCR NEGATIVE NEGATIVE Final    Comment: (NOTE) SARS-CoV-2 target nucleic acids are NOT DETECTED. The SARS-CoV-2 RNA is generally detectable in upper respiratoy specimens during the acute phase of infection. The lowest concentration of SARS-CoV-2 viral copies this assay can detect is 131 copies/mL. A negative result does not preclude SARS-Cov-2 infection and should not be used as the sole basis for treatment or other patient management decisions. A negative result may occur with  improper specimen collection/handling, submission of specimen other than nasopharyngeal swab, presence of viral mutation(s) within the areas targeted by this assay, and inadequate number of viral copies (<131 copies/mL). A negative result must be combined with  clinical observations, patient history, and epidemiological information. The expected result is Negative. Fact Sheet for Patients:  PinkCheek.be Fact Sheet for Healthcare Providers:  GravelBags.it This test is not yet ap proved or cleared by the [place] FDA and  has been authorized for detection and/or diagnosis of SARS-CoV-2 by FDA under an Emergency Use Authorization (EUA). This EUA will remain  in effect (meaning this test can be used) for the duration of the COVID-19 declaration under Section 564(b)(1) of the Act, 21 U.S.C. section 360bbb-3(b)(1), unless the authorization is terminated or revoked sooner.    Influenza A by PCR NEGATIVE NEGATIVE Final   Influenza B by PCR NEGATIVE NEGATIVE Final    Comment: (NOTE) The Xpert Xpress SARS-CoV-2/FLU/RSV assay is intended as an aid in  the diagnosis of influenza from Nasopharyngeal swab specimens and  should not be used as a sole basis  for treatment. Nasal washings and  aspirates are unacceptable for Xpert Xpress SARS-CoV-2/FLU/RSV  testing. Fact Sheet for Patients: PinkCheek.be Fact Sheet for Healthcare Providers: GravelBags.it This test is not yet approved or cleared by the [place] FDA and  has been authorized for detection and/or diagnosis of SARS-CoV-2 by  FDA under an Emergency Use Authorization (EUA). This EUA will remain  in effect (meaning this test can be used) for the duration of the  Covid-19 declaration under Section 564(b)(1) of the Act, 21  U.S.C. section 360bbb-3(b)(1), unless the authorization is  terminated or revoked. Performed at [facility], [address], [place], [place] [address]      Labs: BNP (last 3 results) No results for input(s): BNP in the last 8760 hours. Basic Metabolic Panel: Recent Labs  Lab 03/18/19 1948 03/19/19 0702 03/21/19 0604 03/22/19 0603  NA 138 142 137  136  K 3.3* 3.6 3.1* 3.5  CL 100 102 104 100  CO2 31 32 25 28  GLUCOSE 72 95 83 82  BUN 8 9 7 10   CREATININE 0.45 0.48 0.48 0.44  CALCIUM 8.9 8.7* 8.5* 8.4*  MG  --   --  2.0  --   PHOS  --   --  3.4  --    Liver Function Tests: Recent Labs  Lab 03/18/19 1948 03/19/19 0702 03/21/19 0604  AST 13* 12*  --   ALT 20 17  --   ALKPHOS 79 71  --   BILITOT 0.3 0.4  --   PROT 6.7 6.3*  --   ALBUMIN 3.4* 3.2* 3.3*   No results for input(s): LIPASE, AMYLASE in the last 168 hours. No results for input(s): AMMONIA in the last 168 hours. CBC: Recent Labs  Lab 03/18/19 1948 03/19/19 0702 03/21/19 0604  WBC 11.1* 10.2 12.0*  NEUTROABS 6.2  --  7.3  HGB 13.1 12.7 12.4  HCT 42.9 42.1 40.5  MCV 90.5 90.9 89.2  PLT 492* 451* 427*   Cardiac Enzymes: No results for input(s): CKTOTAL, CKMB, CKMBINDEX, TROPONINI in the last 168 hours. BNP: Invalid input(s): POCBNP CBG: No results for input(s): GLUCAP in the last 168 hours. D-Dimer No results for input(s): DDIMER in the last 72 hours. Hgb A1c No results for input(s): HGBA1C in the last 72 hours. Lipid Profile No results for input(s): CHOL, HDL, LDLCALC, TRIG, CHOLHDL, LDLDIRECT in the last 72 hours. Thyroid function studies No results for input(s): TSH, T4TOTAL, T3FREE, THYROIDAB in the last 72 hours.  Invalid input(s): FREET3 Anemia work up No results for input(s): VITAMINB12, FOLATE, FERRITIN, TIBC, IRON, RETICCTPCT in the last 72 hours. Urinalysis    Component Value Date/Time   COLORURINE YELLOW 03/18/2019 2155   APPEARANCEUR CLOUDY (A) 03/18/2019 2155   LABSPEC 1.010 03/18/2019 2155   PHURINE 8.0 03/18/2019 2155   GLUCOSEU NEGATIVE 03/18/2019 2155   HGBUR MODERATE (A) 03/18/2019 2155   BILIRUBINUR NEGATIVE 03/18/2019 2155   BILIRUBINUR neg 11/29/2015 [place] 03/18/2019 2155   PROTEINUR 100 (A) 03/18/2019 2155   UROBILINOGEN negative 11/29/2015 1418   UROBILINOGEN 0.2 12/30/2010 2300   NITRITE  POSITIVE (A) 03/18/2019 2155   LEUKOCYTESUR LARGE (A) 03/18/2019 2155   Sepsis Labs Invalid input(s): PROCALCITONIN,  WBC,  LACTICIDVEN Microbiology Recent Results (from the past 240 hour(s))  Culture, Urine     Status: Abnormal (Preliminary result)   Collection Time: 03/18/19  3:12 PM   Specimen: Urine, Clean Catch  Result Value Ref Range Status   Specimen Description  Final    URINE, CLEAN CATCH Performed at [facility], [address], [place], [place] [address]    Special Requests   Final    NONE Performed at [facility], [address], [place], [place] [address]    Culture (A)  Final    >=100,000 COLONIES/mL ESCHERICHIA COLI >=100,000 COLONIES/mL MORGANELLA MORGANII SUSCEPTIBILITIES TO FOLLOW Performed at [facility] [address], [place], [place] [address]    Report Status PENDING  Incomplete  Respiratory Panel by RT PCR (Flu A&B, Covid) - Nasopharyngeal Swab     Status: None   Collection Time: 03/18/19  7:49 PM   Specimen: Nasopharyngeal Swab  Result Value Ref Range Status   SARS Coronavirus 2 by RT PCR NEGATIVE NEGATIVE Final    Comment: (NOTE) SARS-CoV-2 target nucleic acids are NOT DETECTED. The SARS-CoV-2 RNA is generally detectable in upper respiratoy specimens during the acute phase of infection. The lowest concentration of SARS-CoV-2 viral copies this assay can detect is 131 copies/mL. A negative result does not preclude SARS-Cov-2 infection and should not be used as the sole basis for treatment or other patient management decisions. A negative result may occur with  improper specimen collection/handling, submission of specimen other than nasopharyngeal swab, presence of viral mutation(s) within the areas targeted by this assay, and inadequate number of viral copies (<131 copies/mL). A negative result must be combined with clinical observations, patient history, and epidemiological information. The expected result is Negative. Fact Sheet for  Patients:  PinkCheek.be Fact Sheet for Healthcare Providers:  GravelBags.it This test is not yet ap proved or cleared by the [place] FDA and  has been authorized for detection and/or diagnosis of SARS-CoV-2 by FDA under an Emergency Use Authorization (EUA). This EUA will remain  in effect (meaning this test can be used) for the duration of the COVID-19 declaration under Section 564(b)(1) of the Act, 21 U.S.C. section 360bbb-3(b)(1), unless the authorization is terminated or revoked sooner.    Influenza A by PCR NEGATIVE NEGATIVE Final   Influenza B by PCR NEGATIVE NEGATIVE Final    Comment: (NOTE) The Xpert Xpress SARS-CoV-2/FLU/RSV assay is intended as an aid in  the diagnosis of influenza from Nasopharyngeal swab specimens and  should not be used as a sole basis for treatment. Nasal washings and  aspirates are unacceptable for Xpert Xpress SARS-CoV-2/FLU/RSV  testing. Fact Sheet for Patients: PinkCheek.be Fact Sheet for Healthcare Providers: GravelBags.it This test is not yet approved or cleared by the [place] FDA and  has been authorized for detection and/or diagnosis of SARS-CoV-2 by  FDA under an Emergency Use Authorization (EUA). This EUA will remain  in effect (meaning this test can be used) for the duration of the  Covid-19 declaration under Section 564(b)(1) of the Act, 21  U.S.C. section 360bbb-3(b)(1), unless the authorization is  terminated or revoked. Performed at [facility], [address], [place], [place] [address]      Time coordinating discharge: 25 minutes.  SIGNED:   Doree Albee, MD  [facility] 03/22/2019, 8:15 AM   If 7PM-7AM, please contact night-coverage [email or web address]

## 2019-03-30 ENCOUNTER — Other Ambulatory Visit: Payer: Self-pay | Admitting: Anesthesiology

## 2019-04-08 ENCOUNTER — Encounter (HOSPITAL_BASED_OUTPATIENT_CLINIC_OR_DEPARTMENT_OTHER): Payer: Medicaid Other | Attending: Internal Medicine | Admitting: Internal Medicine

## 2019-04-08 ENCOUNTER — Other Ambulatory Visit: Payer: Self-pay

## 2019-04-08 DIAGNOSIS — L89154 Pressure ulcer of sacral region, stage 4: Secondary | ICD-10-CM | POA: Insufficient documentation

## 2019-04-08 NOTE — Progress Notes (Signed)
MINDA, FAAS ([id number]) Visit Report for 04/08/2019 Arrival Information Details Patient Name: Date of Service: [name], [name] 04/08/2019 12:30 PM Medical Record [id number] Patient Account Number: [id number] Date of Birth/Sex: Treating RN: March 29, 1980 (39 y.o. [name] Primary Care [name]: SYSTEM, PCP Other Clinician: Referring [name]: Treating [name]/Extender:Robson, Forestine Chute, Lamar Blinks in Treatment: 145 Visit Information History Since Last Visit All ordered tests and consults were completed: No Patient Arrived: Wheel Chair Added or deleted any medications: No Arrival Time: 13:01 Any new allergies or adverse reactions: No Accompanied By: caregiver Had a fall or experienced change in No activities of daily living that may affect Transfer Assistance: Harrel Lemon Lift risk of falls: Patient Identification Verified: Yes Signs or symptoms of abuse/neglect since last No Secondary Verification Process Completed: Yes visito Patient Requires Transmission-Based No Hospitalized since last visit: No Precautions: Implantable device outside of the clinic excluding No Patient Has Alerts: No cellular tissue based products placed in the center since last visit: Has Dressing in Place as Prescribed: Yes Pain Present Now: No Electronic Signature(s) Signed: 04/08/2019 5:25:54 PM By: Carlene Coria RN Entered By: Carlene Coria on 04/08/2019 13:02:05 -------------------------------------------------------------------------------- Clinic Level of Care Assessment Details Patient Name: Date of Service: [name] 04/08/2019 12:30 PM Medical Record [id number] Patient Account Number: [id number] Date of Birth/Sex: Treating RN: [name] (39 y.o. [name] Primary Care Allesandra Huebsch: SYSTEM, PCP Other Clinician: Referring [name]: Treating [name]/Extender:Robson, Forestine Chute, Lamar Blinks in Treatment: 145 Clinic Level of Care Assessment  Items TOOL 4 Quantity Score X - Use when only an EandM is performed on FOLLOW-UP visit 1 0 ASSESSMENTS - Nursing Assessment / Reassessment X - Reassessment of Co-morbidities (includes updates in patient status) 1 10 X - Reassessment of Adherence to Treatment Plan 1 5 ASSESSMENTS - Wound and Skin Assessment / Reassessment X - Simple Wound Assessment / Reassessment - one wound 1 5 '[]'  - Complex Wound Assessment / Reassessment - multiple wounds 0 '[]'  - Dermatologic / Skin Assessment (not related to wound area) 0 ASSESSMENTS - Focused Assessment '[]'  - Circumferential Edema Measurements - multi extremities 0 '[]'  - Nutritional Assessment / Counseling / Intervention 0 '[]'  - Lower Extremity Assessment (monofilament, tuning fork, pulses) 0 '[]'  - Peripheral Arterial Disease Assessment (using hand held doppler) 0 ASSESSMENTS - Ostomy and/or Continence Assessment and Care '[]'  - Incontinence Assessment and Management 0 '[]'  - Ostomy Care Assessment and Management (repouching, etc.) 0 PROCESS - Coordination of Care X - Simple Patient / Family Education for ongoing care 1 15 '[]'  - Complex (extensive) Patient / Family Education for ongoing care 0 X - Staff obtains Programmer, systems, Records, Test Results / Process Orders 1 10 X - Staff telephones HHA, Nursing Homes / Clarify orders / etc 1 10 '[]'  - Routine Transfer to another Facility (non-emergent condition) 0 '[]'  - Routine Hospital Admission (non-emergent condition) 0 '[]'  - New Admissions / Biomedical engineer / Ordering NPWT, Apligraf, etc. 0 '[]'  - Emergency Hospital Admission (emergent condition) 0 X - Simple Discharge Coordination 1 10 '[]'  - Complex (extensive) Discharge Coordination 0 PROCESS - Special Needs '[]'  - Pediatric / Minor Patient Management 0 '[]'  - Isolation Patient Management 0 '[]'  - Hearing / Language / Visual special needs 0 '[]'  - Assessment of Community assistance (transportation, D/C planning, etc.) 0 '[]'  - Additional assistance / Altered mentation  0 '[]'  - Support Surface(s) Assessment (bed, cushion, seat, etc.) 0 INTERVENTIONS - Wound Cleansing / Measurement X - Simple Wound Cleansing - one wound 1 5 '[]'  - Complex  Wound Cleansing - multiple wounds 0 X - Wound Imaging (photographs - any number of wounds) 1 5 '[]'  - Wound Tracing (instead of photographs) 0 X - Simple Wound Measurement - one wound 1 5 '[]'  - Complex Wound Measurement - multiple wounds 0 INTERVENTIONS - Wound Dressings X - Small Wound Dressing one or multiple wounds 1 10 '[]'  - Medium Wound Dressing one or multiple wounds 0 '[]'  - Large Wound Dressing one or multiple wounds 0 X - Application of Medications - topical 1 5 '[]'  - Application of Medications - injection 0 INTERVENTIONS - Miscellaneous '[]'  - External ear exam 0 '[]'  - Specimen Collection (cultures, biopsies, blood, body fluids, etc.) 0 '[]'  - Specimen(s) / Culture(s) sent or taken to Lab for analysis 0 '[]'  - Patient Transfer (multiple staff / Civil Service fast streamer / Similar devices) 0 '[]'  - Simple Staple / Suture removal (25 or less) 0 '[]'  - Complex Staple / Suture removal (26 or more) 0 '[]'  - Hypo / Hyperglycemic Management (close monitor of Blood Glucose) 0 '[]'  - Ankle / Brachial Index (ABI) - do not check if billed separately 0 X - Vital Signs 1 5 Has the patient been seen at the hospital within the last three years: Yes Total Score: 100 Level Of Care: New/Established - Level 3 Electronic Signature(s) Signed: 04/08/2019 5:49:59 PM By: Kela Millin Entered By: Kela Millin on 04/08/2019 13:12:43 -------------------------------------------------------------------------------- Encounter Discharge Information Details Patient Name: Date of Service: Jamie Jews A. 04/08/2019 12:30 PM Medical Record [id number] Patient Account Number: [id number] Date of Birth/Sex: Treating RN: 03-07-1980 (39 y.o. [name] Primary Care Erling Arrazola: SYSTEM, PCP Other Clinician: Referring Lavaughn Bisig: Treating  [name]/Extender:Robson, Forestine Chute, Lamar Blinks in Treatment: 145 Encounter Discharge Information Items Discharge Condition: Stable Ambulatory Status: Wheelchair Discharge Destination: Home Transportation: Private Auto Accompanied By: self Schedule Follow-up Appointment: Yes Clinical Summary of Care: Electronic Signature(s) Signed: 04/08/2019 5:54:34 PM By: Deon Pilling Entered By: Deon Pilling on 04/08/2019 14:12:12 -------------------------------------------------------------------------------- Multi Wound Chart Details Patient Name: Date of Service: Jamie Jews A. 04/08/2019 12:30 PM Medical Record [id number] Patient Account Number: [id number] Date of Birth/Sex: Treating RN: 05-31-1980 (39 y.o. F) Primary Care Cassie Henkels: SYSTEM, PCP Other Clinician: Referring Wealthy Danielski: Treating Ezme Duch/Extender:Robson, Forestine Chute, Junius Roads Weeks in Treatment: 145 Vital Signs Height(in): 65 Pulse(bpm): 89 Weight(lbs): 201 Blood Pressure(mmHg): 129/79 Body Mass Index(BMI): 33 Temperature(F): 98.9 Respiratory 18 Rate(breaths/min): Photos: [1:No Photos] [N/A:N/A] Wound Location: [1:Sacrum - Medial] [N/A:N/A] Wounding Event: [1:Pressure Injury] [N/A:N/A] Primary Etiology: [1:Pressure Ulcer] [N/A:N/A] Comorbid History: [1:Anemia, Osteomyelitis] [N/A:N/A] Date Acquired: [1:05/15/2016] [N/A:N/A] Weeks of Treatment: [8:676] [N/A:N/A] Wound Status: [1:Open] [N/A:N/A] Measurements L x W x D [1:0.5x0.2x1.6] [N/A:N/A] (cm) Area (cm) : [1:0.079] [N/A:N/A] Volume (cm) : [1:0.126] [N/A:N/A] % Reduction in Area: [1:99.80%] [N/A:N/A] % Reduction in Volume: [1:99.90%] [N/A:N/A] Starting Position 1 [1:6] (o'clock): Ending Position 1 [1:12] (o'clock): Maximum Distance 1 [1:1.3] (cm): Undermining: [1:Yes] [N/A:N/A] Classification: [1:Category/Stage IV] [N/A:N/A] Exudate Amount: [1:Medium] [N/A:N/A] Exudate Type: [1:Serosanguineous] [N/A:N/A] Exudate Color: [1:red,  brown] [N/A:N/A] Wound Margin: [1:Epibole] [N/A:N/A] Granulation Amount: [1:Large (67-100%)] [N/A:N/A] Granulation Quality: [1:Pink, Pale] [N/A:N/A] Necrotic Amount: [1:None Present (0%)] [N/A:N/A] Exposed Structures: [1:Fat Layer (Subcutaneous Tissue) Exposed: Yes Fascia: No Tendon: No Muscle: No Joint: No Bone: No Medium (34-66%)] [N/A:N/A N/A] Treatment Notes Electronic Signature(s) Signed: 04/08/2019 5:45:44 PM By: Linton Ham MD Entered By: Linton Ham on 04/08/2019 13:14:04 -------------------------------------------------------------------------------- Multi-Disciplinary Care Plan Details Patient Name: Date of Service: Jamie Jews A. 04/08/2019 12:30 PM Medical Record [id number] Patient Account Number: [id number] Date of Birth/Sex: Treating RN: 04/09/1980 (38  y.o. [name] Primary Care Dshawn Mcnay: SYSTEM, PCP Other Clinician: Referring Esco Joslyn: Treating Tymia Streb/Extender:Robson, Forestine Chute, Junius Roads Weeks in Treatment: 145 Active Inactive Pressure Nursing Diagnoses: Knowledge deficit related to management of pressures ulcers Potential for impaired tissue integrity related to pressure, friction, moisture, and shear Goals: Patient will remain free from development of additional pressure ulcers Date Inactivated: 01/17/2019 Target10/23/2020 Resolution Date Initiated: 06/27/2016 Date: Goal Status: Met Patient/caregiver will verbalize risk factors for pressure ulcer development Date Initiated: 06/27/2016 Date Inactivated: 07/08/2017 Target Resolution Date: 07/08/2017 Goal Status: Met Patient/caregiver will verbalize understanding of pressure ulcer management Date Initiated: 06/27/2016 Target Resolution Date: 05/13/2019 Goal Status: Active Interventions: Assess: immobility, friction, shearing, incontinence upon admission and as needed Assess offloading mechanisms upon admission and as needed Assess potential for pressure ulcer upon admission and as  needed Provide education on pressure ulcers Treatment Activities: Patient referred for pressure reduction/relief devices : 06/27/2016 Pressure reduction/relief device ordered : 06/27/2016 Notes: Wound/Skin Impairment Nursing Diagnoses: Impaired tissue integrity Knowledge deficit related to smoking impact on wound healing Knowledge deficit related to ulceration/compromised skin integrity Goals: Patient will demonstrate a reduced rate of smoking or cessation of smoking Date Initiated: 06/27/2016 Date Inactivated: 01/26/2017 Target Resolution Date: 01/08/2017 Unmet Reason: pt continues Goal Status: Unmet to smoke Patient/caregiver will verbalize understanding of skin care regimen Date Initiated: 06/27/2016 Target Resolution Date: 05/13/2019 Goal Status: Active Ulcer/skin breakdown will have a volume reduction of 50% by week 8 Date Initiated: 06/27/2016 Date Inactivated: 12/11/2016 Target Resolution Date: 07/25/2016 Goal Status: Met Interventions: Assess patient/caregiver ability to obtain necessary supplies Assess patient/caregiver ability to perform ulcer/skin care regimen upon admission and as needed Assess ulceration(s) every visit Provide education on smoking Provide education on ulcer and skin care Treatment Activities: Skin care regimen initiated : 06/27/2016 Topical wound management initiated : 06/27/2016 Notes: Electronic Signature(s) Signed: 04/08/2019 5:49:59 PM By: Kela Millin Entered By: Kela Millin on 04/08/2019 12:56:55 -------------------------------------------------------------------------------- Pain Assessment Details Patient Name: Date of Service: IYSHA, MISHKIN 04/08/2019 12:30 PM Medical Record [id number] Patient Account Number: [id number] Date of Birth/Sex: Treating RN: [birth year] (39 y.o. [name] Primary Care Frederica Chrestman: SYSTEM, PCP Other Clinician: Referring Tylar Merendino: Treating Moya Duan/Extender:Robson, Forestine Chute,  Junius Roads Weeks in Treatment: 145 Active Problems Location of Pain Severity and Description of Pain Patient Has Paino No Site Locations Pain Management and Medication Current Pain Management: Electronic Signature(s) Signed: 04/08/2019 5:25:54 PM By: Carlene Coria RN Entered By: Carlene Coria on 04/08/2019 13:02:45 -------------------------------------------------------------------------------- Patient/Caregiver Education Details Patient Name: Jamie Jews A. Date of Service: 2/5/2021andnbsp12:30 PM Medical Record [id number] Patient Account Number: [id number] Date of Birth/Gender: 10-31-1980 (38 y.o. F) Treating RN: Kela Millin Primary Care Physician: SYSTEM, PCP Other Clinician: Referring Physician: Treating Physician/Extender:Robson, Forestine Chute, Lamar Blinks in Treatment: 145 Education Assessment Education Provided To: Patient Education Topics Provided Pressure: Methods: Explain/Verbal Responses: State content correctly Smoking and Wound Healing: Methods: Explain/Verbal Responses: State content correctly Wound/Skin Impairment: Methods: Explain/Verbal Responses: State content correctly Electronic Signature(s) Signed: 04/08/2019 5:49:59 PM By: Kela Millin Entered By: Kela Millin on 04/08/2019 12:57:18 -------------------------------------------------------------------------------- Wound Assessment Details Patient Name: Date of Service: Jamie Jews A. 04/08/2019 12:30 PM Medical Record [id number] Patient Account Number: [id number] Date of Birth/Sex: Treating RN: 06-08-1980 (39 y.o. [name] Primary Care Kien Mirsky: SYSTEM, PCP Other Clinician: Referring Ezel Vallone: Treating Orel Hord/Extender:Robson, Forestine Chute, Junius Roads Weeks in Treatment: 145 Wound Status Wound Number: 1 Primary Etiology: Pressure Ulcer Wound Location: Sacrum - Medial Wound Status: Open Wounding Event: Pressure Injury Comorbid History: Anemia,  Osteomyelitis Date Acquired: 05/15/2016 [place]  Of Treatment: 145 Clustered Wound: No Wound Measurements Length: (cm) 0.5 Width: (cm) 0.2 Depth: (cm) 1.6 Area: (cm) 0.079 Volume: (cm) 0.126 % Reduction in Area: 99.8% % Reduction in Volume: 99.9% Epithelialization: Medium (34-66%) Tunneling: No Undermining: Yes Starting Position (o'clock): 6 Ending Position (o'clock): 12 Maximum Distance: (cm) 1.3 Wound Description Classification: Category/Stage IV Wound Margin: Epibole Exudate Amount: Medium Exudate Type: Serosanguineous Exudate Color: red, brown Wound Bed Granulation Amount: Large (67-100%) Granulation Quality: Pink, Pale Necrotic Amount: None Present (0%) Foul Odor After Cleansing: No Slough/Fibrino Yes Exposed Structure Fascia Exposed: No Fat Layer (Subcutaneous Tissue) Exposed: Yes Tendon Exposed: No Muscle Exposed: No Joint Exposed: No Bone Exposed: No Treatment Notes Wound #1 (Medial Sacrum) 1. Cleanse With Wound Cleanser 2. Periwound Care Skin Prep 3. Primary Dressing Applied Calcium Alginate Ag 4. Secondary Dressing Foam Border Dressing 5. Secured With Office manager) Signed: 04/08/2019 5:49:59 PM By: Kela Millin Entered By: Kela Millin on 04/08/2019 13:12:00 -------------------------------------------------------------------------------- Vitals Details Patient Name: Date of Service: Jamie Jews A. 04/08/2019 12:30 PM Medical Record [id number] Patient Account Number: [id number] Date of Birth/Sex: Treating RN: [birth year] (39 y.o. [name] Primary Care Chandy Tarman: SYSTEM, PCP Other Clinician: Referring Johniya Durfee: Treating Taiwo Fish/Extender:Robson, Forestine Chute, Junius Roads Weeks in Treatment: 145 Vital Signs Time Taken: 13:02 Temperature (F): 98.9 Height (in): 65 Pulse (bpm): 89 Weight (lbs): 201 Respiratory Rate (breaths/min): 18 Body Mass Index (BMI): 33.4 Blood Pressure (mmHg):  129/79 Reference Range: 80 - 120 mg / dl Electronic Signature(s) Signed: 04/08/2019 5:25:54 PM By: Carlene Coria RN Entered By: Carlene Coria on 04/08/2019 13:02:35

## 2019-04-16 ENCOUNTER — Other Ambulatory Visit: Payer: Self-pay | Admitting: Family Medicine

## 2019-04-24 ENCOUNTER — Encounter (HOSPITAL_COMMUNITY): Payer: Self-pay

## 2019-04-24 ENCOUNTER — Other Ambulatory Visit: Payer: Self-pay

## 2019-04-24 ENCOUNTER — Emergency Department (HOSPITAL_COMMUNITY)
Admission: EM | Admit: 2019-04-24 | Discharge: 2019-04-24 | Disposition: A | Discharge status: Home / Self Care | Payer: Medicaid Other | Attending: Emergency Medicine | Admitting: Emergency Medicine

## 2019-04-24 DIAGNOSIS — Z960 Presence of urogenital implants: Secondary | ICD-10-CM | POA: Diagnosis not present

## 2019-04-24 DIAGNOSIS — N390 Urinary tract infection, site not specified: Secondary | ICD-10-CM | POA: Diagnosis not present

## 2019-04-24 DIAGNOSIS — R110 Nausea: Secondary | ICD-10-CM | POA: Diagnosis not present

## 2019-04-24 DIAGNOSIS — R1032 Left lower quadrant pain: Secondary | ICD-10-CM | POA: Diagnosis present

## 2019-04-24 DIAGNOSIS — G8220 Paraplegia, unspecified: Secondary | ICD-10-CM | POA: Insufficient documentation

## 2019-04-24 DIAGNOSIS — F17210 Nicotine dependence, cigarettes, uncomplicated: Secondary | ICD-10-CM | POA: Insufficient documentation

## 2019-04-24 DIAGNOSIS — R109 Unspecified abdominal pain: Secondary | ICD-10-CM

## 2019-04-24 LAB — COMPREHENSIVE METABOLIC PANEL
ALT: 12 U/L (ref 0–44)
AST: 12 U/L — ABNORMAL LOW (ref 15–41)
Albumin: 3.9 g/dL (ref 3.5–5.0)
Alkaline Phosphatase: 86 U/L (ref 38–126)
Anion gap: 8 (ref 5–15)
BUN: 7 mg/dL (ref 6–20)
CO2: 27 mmol/L (ref 22–32)
Calcium: 8.9 mg/dL (ref 8.9–10.3)
Chloride: 101 mmol/L (ref 98–111)
Creatinine, Ser: 0.45 mg/dL (ref 0.44–1.00)
GFR calc Af Amer: >60 mL/min (ref >60)
GFR calc non Af Amer: >60 mL/min (ref >60)
Glucose, Bld: 81 mg/dL (ref 70–99)
Potassium: 3.5 mmol/L (ref 3.5–5.1)
Sodium: 136 mmol/L (ref 135–145)
Total Bilirubin: 0.4 mg/dL (ref 0.3–1.2)
Total Protein: 8.1 g/dL (ref 6.5–8.1)

## 2019-04-24 LAB — CBC WITH DIFFERENTIAL/PLATELET
Abs Immature Granulocytes: 0.04 10*3/uL (ref 0.00–0.07)
Basophils Absolute: 0.1 10*3/uL (ref 0.0–0.1)
Basophils Relative: 0 %
Eosinophils Absolute: 0.3 10*3/uL (ref 0.0–0.5)
Eosinophils Relative: 3 %
HCT: 45.5 % (ref 36.0–46.0)
Hemoglobin: 14.1 g/dL (ref 12.0–15.0)
Immature Granulocytes: 0 %
Lymphocytes Relative: 28 %
Lymphs Abs: 3.2 10*3/uL (ref 0.7–4.0)
MCH: 28.1 pg (ref 26.0–34.0)
MCHC: 31 g/dL (ref 30.0–36.0)
MCV: 90.6 fL (ref 80.0–100.0)
Monocytes Absolute: 0.6 10*3/uL (ref 0.1–1.0)
Monocytes Relative: 5 %
Neutro Abs: 7.4 10*3/uL (ref 1.7–7.7)
Neutrophils Relative %: 64 %
Platelets: 551 10*3/uL — ABNORMAL HIGH (ref 150–400)
RBC: 5.02 MIL/uL (ref 3.87–5.11)
RDW: 15.9 % — ABNORMAL HIGH (ref 11.5–15.5)
WBC: 11.7 10*3/uL — ABNORMAL HIGH (ref 4.0–10.5)
nRBC: 0 % (ref 0.0–0.2)

## 2019-04-24 LAB — URINALYSIS, ROUTINE W REFLEX MICROSCOPIC
Bilirubin Urine: NEGATIVE
Glucose, UA: NEGATIVE mg/dL
Ketones, ur: NEGATIVE mg/dL
Nitrite: NEGATIVE
Protein, ur: NEGATIVE mg/dL
Specific Gravity, Urine: 1.004 — ABNORMAL LOW (ref 1.005–1.030)
pH: 7 (ref 5.0–8.0)

## 2019-04-24 LAB — LACTIC ACID, PLASMA: Lactic Acid, Venous: 1.3 mmol/L (ref 0.5–1.9)

## 2019-04-24 MED ORDER — KETOROLAC TROMETHAMINE 30 MG/ML IJ SOLN
15.0000 mg | Freq: Once | INTRAMUSCULAR | Status: AC
  Administered 2019-04-24: 15 mg via INTRAVENOUS
  Filled 2019-04-24: qty 1

## 2019-04-24 MED ORDER — ONDANSETRON 4 MG PO TBDP: 4.0000 mg | ORAL_TABLET | Freq: Three times a day (TID) | ORAL | 0 refills | Status: DC | PRN

## 2019-04-24 MED ORDER — SODIUM CHLORIDE 0.9 % IV SOLN
1.0000 g | Freq: Once | INTRAVENOUS | Status: AC
  Administered 2019-04-24: 1 g via INTRAVENOUS
  Filled 2019-04-24: qty 10

## 2019-04-24 MED ORDER — CIPROFLOXACIN HCL 500 MG PO TABS: 500.0000 mg | ORAL_TABLET | Freq: Two times a day (BID) | ORAL | 0 refills | Status: DC

## 2019-04-24 MED ORDER — SODIUM CHLORIDE 0.9 % IV BOLUS
1000.0000 mL | Freq: Once | INTRAVENOUS | Status: AC
  Administered 2019-04-24: 1000 mL via INTRAVENOUS

## 2019-04-24 MED ORDER — ONDANSETRON HCL 4 MG/2ML IJ SOLN
4.0000 mg | Freq: Once | INTRAMUSCULAR | Status: AC
  Administered 2019-04-24: 4 mg via INTRAVENOUS
  Filled 2019-04-24: qty 2

## 2019-04-24 MED ORDER — HYDROMORPHONE HCL 1 MG/ML IJ SOLN
1.0000 mg | Freq: Once | INTRAMUSCULAR | Status: AC
  Administered 2019-04-24: 20:00:00 1 mg via INTRAVENOUS
  Filled 2019-04-24: qty 1

## 2019-04-24 NOTE — ED Notes (Signed)
Pt given dinner tray.

## 2019-04-24 NOTE — Discharge Instructions (Signed)
Start Cipro for UTI - take twice daily with food Take Zofran as needed for nausea Please return if you are worsening
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
## 2019-04-24 NOTE — ED Triage Notes (Signed)
Pt presents to ED via [facility] for left sided flank pain. Pt paraplegic and has indwelling catheter. Pt states urine has been dark and foul smelling. Pt denies fever.

## 2019-04-25 NOTE — Progress Notes (Addendum)
[id number] - [place],  - [place] [address] [place] [address] Phone: [phone] Fax: [phone]      Your procedure is scheduled on Friday, February 26. 2021.  Report to [facility] Main Entrance "A" at 5:30 A.M., and check in at the Admitting office.  Call this number if you have problems the morning of surgery:  [phone]  Call [phone] if you have any questions prior to your surgery date Monday-Friday 8am-4pm    Remember:  Do not eat or drink after midnight the night before your surgery    Take these medicines the morning of surgery with A SIP OF WATER: ALPRAZolam (XANAX)  ciprofloxacin (CIPRO) citalopram (CELEXA)  gabapentin (NEURONTIN) levothyroxine (SYNTHROID, LEVOTHROID) omeprazole (PRILOSEC) oxybutynin (DITROPAN-XL)  acetaminophen (TYLENOL) - if needed methocarbamol (ROBAXIN) - if needed ondansetron (ZOFRAN ODT) - if needed Oxycodone HCl - if needed  7 days prior to surgery STOP taking any Aspirin (unless otherwise instructed by your surgeon), Aleve, Naproxen, Ibuprofen, Motrin, Advil, Goody's, BC's, all herbal medications, fish oil, and all vitamins.    The Morning of Surgery  Do not wear jewelry, make-up or nail polish.  Do not wear lotions, powders, or perfumes/colognes, or deodorant  Do not shave 48 hours prior to surgery.  Do not bring valuables to the hospital.  [facility] is not responsible for any belongings or valuables.  If you are a smoker, DO NOT Smoke 24 hours prior to surgery  If you wear a CPAP at night please bring your mask the morning of surgery   Remember that you must have someone to transport you home after your surgery, and remain with you for 24 hours if you are discharged the same day.   Please bring cases for contacts, glasses, hearing aids, dentures or bridgework because it cannot be worn into surgery.    Leave your suitcase in the car.  After surgery it may be brought to your  room.  For patients admitted to the hospital, discharge time will be determined by your treatment team.  Patients discharged the day of surgery will not be allowed to drive home.    Special instructions:   [place]- Preparing For Surgery  Before surgery, you can play an important role. Because skin is not sterile, your skin needs to be as free of germs as possible. You can reduce the number of germs on your skin by washing with CHG (chlorahexidine gluconate) Soap before surgery.  CHG is an antiseptic cleaner which kills germs and bonds with the skin to continue killing germs even after washing.    Oral Hygiene is also important to reduce your risk of infection.  Remember - BRUSH YOUR TEETH THE MORNING OF SURGERY WITH YOUR REGULAR TOOTHPASTE  Please do not use if you have an allergy to CHG or antibacterial soaps. If your skin becomes reddened/irritated stop using the CHG.  Do not shave (including legs and underarms) for at least 48 hours prior to first CHG shower. It is OK to shave your face.  Please follow these instructions carefully.   1. Shower the NIGHT BEFORE SURGERY and the MORNING OF SURGERY with CHG Soap.   2. If you chose to wash your hair, wash your hair first as usual with your normal shampoo.  3. After you shampoo, rinse your hair and body thoroughly to remove the shampoo.  4. Use CHG as you would any other liquid soap. You can apply CHG directly to the skin and wash  gently with a scrungie or a clean washcloth.   5. Apply the CHG Soap to your body ONLY FROM THE NECK DOWN.  Do not use on open wounds or open sores. Avoid contact with your eyes, ears, mouth and genitals (private parts). Wash Face and genitals (private parts)  with your normal soap.   6. Wash thoroughly, paying special attention to the area where your surgery will be performed.  7. Thoroughly rinse your body with warm water from the neck down.  8. DO NOT shower/wash with your normal soap after using and  rinsing off the CHG Soap.  9. Pat yourself dry with a CLEAN TOWEL.  10. Wear CLEAN PAJAMAS to bed the night before surgery, wear comfortable clothes the morning of surgery  11. Place CLEAN SHEETS on your bed the night of your first shower and DO NOT SLEEP WITH PETS.    Day of Surgery:  Please shower the morning of surgery with the CHG soap Do not apply any deodorants/lotions. Please wear clean clothes to the hospital/surgery center.   Remember to brush your teeth WITH YOUR REGULAR TOOTHPASTE.   Please read over the following fact sheets that you were given.

## 2019-04-26 ENCOUNTER — Other Ambulatory Visit: Payer: Self-pay

## 2019-04-26 ENCOUNTER — Encounter (HOSPITAL_COMMUNITY)
Admission: RE | Admit: 2019-04-26 | Discharge: 2019-04-26 | Disposition: A | Discharge status: Home / Self Care | Payer: Medicaid Other | Source: Ambulatory Visit | Attending: Anesthesiology | Admitting: Anesthesiology

## 2019-04-26 ENCOUNTER — Encounter (HOSPITAL_COMMUNITY): Payer: Self-pay

## 2019-04-26 DIAGNOSIS — Z01812 Encounter for preprocedural laboratory examination: Secondary | ICD-10-CM | POA: Diagnosis not present

## 2019-04-26 DIAGNOSIS — Z01810 Encounter for preprocedural cardiovascular examination: Secondary | ICD-10-CM | POA: Insufficient documentation

## 2019-04-26 DIAGNOSIS — Z01818 Encounter for other preprocedural examination: Secondary | ICD-10-CM | POA: Diagnosis present

## 2019-04-26 DIAGNOSIS — Z79899 Other long term (current) drug therapy: Secondary | ICD-10-CM | POA: Insufficient documentation

## 2019-04-26 LAB — SURGICAL PCR SCREEN
MRSA, PCR: NEGATIVE
Staphylococcus aureus: NEGATIVE

## 2019-04-26 NOTE — Progress Notes (Signed)
PCP - Does not have a set PCP Cardiologist - Denies  PPM/ICD - Denies  Chest x-ray - N/A EKG - 04/26/19 Stress Test - Denies ECHO - Denies Cardiac Cath - Denies  Sleep Study - Denies  Pt denies being diabetic.  Blood Thinner Instructions: N/A Aspirin Instructions: N/A  ERAS Protcol - No   COVID TEST- 04/27/19   Coronavirus Screening  Have you experienced the following symptoms:  Cough yes/no: No Fever (>100.2F)  yes/no: No Runny nose yes/no: No Sore throat yes/no: No Difficulty breathing/shortness of breath  yes/no: No  Have you or a family member traveled in the last 14 days and where? yes/no: No   If the patient indicates "YES" to the above questions, their PAT will be rescheduled to limit the exposure to others and, the surgeon will be notified. THE PATIENT WILL NEED TO BE ASYMPTOMATIC FOR 14 DAYS.   If the patient is not experiencing any of these symptoms, the PAT nurse will instruct them to NOT bring anyone with them to their appointment since they may have these symptoms or traveled as well.   Please remind your patients and families that hospital visitation restrictions are in effect and the importance of the restrictions.     Anesthesia review: No  Patient denies shortness of breath, fever, cough and chest pain at PAT appointment   All instructions explained to the patient, with a verbal understanding of the material. Patient agrees to go over the instructions while at home for a better understanding. Patient also instructed to self quarantine after being tested for COVID-19. The opportunity to ask questions was provided.

## 2019-04-27 ENCOUNTER — Other Ambulatory Visit (HOSPITAL_COMMUNITY)
Admission: RE | Admit: 2019-04-27 | Discharge: 2019-04-27 | Disposition: A | Discharge status: Home / Self Care | Payer: Medicaid Other | Source: Ambulatory Visit | Attending: Anesthesiology | Admitting: Anesthesiology

## 2019-04-27 DIAGNOSIS — Z20822 Contact with and (suspected) exposure to covid-19: Secondary | ICD-10-CM | POA: Insufficient documentation

## 2019-04-27 DIAGNOSIS — Z01812 Encounter for preprocedural laboratory examination: Secondary | ICD-10-CM | POA: Insufficient documentation

## 2019-04-27 LAB — SARS CORONAVIRUS 2 (TAT 6-24 HRS): SARS Coronavirus 2: NEGATIVE

## 2019-04-27 NOTE — H&P (Signed)
Patient ID:   [id number] Patient: [name]  Date of Birth: 09-Aug-1980 Visit Type: Office Visit   Date: 03/29/2019 03:45 PM Provider: Bonna Gains PhD MD   This 39 year old female presents for pain.  HISTORY OF PRESENT ILLNESS:  1.  pain  [name] comes back to clinic today for a wound check.  As you will recall she underwent attempted placement of an intrathecal catheter, complicated by intradural scar tissue through the area of her burst fracture and fusion.  She subsequently developed a wound infection that required sustained antibiotic therapy, and closure by wound VAC/secondary intention.  She comes to clinic today that her wound is well healed.  She continues to smoke.  I have encouraged her to stop smoking.  She is, however, at this stage we can consider re-attempt of her intrathecal catheter, with access at a higher level.         Medical/Surgical/Interim History Reviewed, no change.     PAST MEDICAL HISTORY, SURGICAL HISTORY, FAMILY HISTORY, SOCIAL HISTORY AND REVIEW OF SYSTEMS I have reviewed the patient's past medical, surgical, family and social history as well as the comprehensive review of systems as included on the [facility] history form dated 11/23/2018, which I have signed.  Family History:  Reviewed, no changes.    Social History: Reviewed, no changes.   MEDICATIONS: (added, continued or stopped this visit) Started Medication Directions Instruction Stopped  01/18/2019 alprazolam 0.25 mg tablet TAKE 1 TABLET (0.25 MG TOTAL) BY MOUTH THREE (3) TIMES A DAY AS NEEDED FOR ANXIETY.     Celexa 40 mg tablet take 1 tablet by oral route  every day    01/03/2019 clindamycin HCl 150 mg capsule TAKE 1 CAPSULE BY MOUTH FOUR TIMES A DAY FOR 10 DAYS    12/02/2018 cyanocobalamin (vit B-12) 1,000 mcg tablet TAKE 1 TABLET BY MOUTH EVERY DAY    01/26/2019 diphenoxylate-atropine 2.5 mg-0.025 mg tablet TAKE 2 TABLETS BY MOUTH 4 (FOUR)  TIMES DAILY AS NEEDED FOR DIARRHEA OR LOOSE STOOLS.    01/01/2019 gabapentin 600 mg tablet TAKE 1 TABLET (600 MG TOTAL) BY MOUTH THREE (3) TIMES A DAY.    11/12/2018 Klor-Con M20 mEq tablet,extended release TAKE 1 TABLET BY MOUTH EVERY DAY FOR 5 DAYS     levothyroxine 88 mcg tablet take 1 tablet by oral route  every day    12/08/2018 nitrofurantoin monohydrate/macrocrystals 100 mg capsule TAKE 1 CAPSULE (100 MG TOTAL) BY MOUTH TWO (2) TIMES A DAY FOR 7 DAYS.    01/03/2019 nystatin 100,000 unit/gram topical ointment APPLY TO AFFECTED AREAS TWICE TIMES A DAY    11/06/2018 omeprazole 20 mg capsule,delayed release TAKE 1 CAPSULE BY MOUTH EVERY DAY    12/09/2018 oxybutynin chloride ER 5 mg tablet,extended release 24 hr TAKE 1 TABLET BY MOUTH EVERY DAY    02/07/2019 oxycodone 10 mg tablet take 1 tablet by oral route  every  6 hours (DNF until 02/11/2019)    02/22/2019 oxycodone 10 mg tablet take 1 tablet by oral route  every  6 hours (DNF until 03/13/19)    01/31/2019 promethazine 25 mg tablet TAKE 1 TABLET (25 MG TOTAL) BY MOUTH EVERY 6 (SIX) HOURS AS NEEDED FOR NAUSEA OR VOMITING.     Robaxin 500 mg tablet take 1 tablet by oral route 4 times every day       ALLERGIES: Ingredient Reaction Medication Name Comment  NO KNOWN ALLERGIES     No known allergies. Reviewed, no changes.  PHYSICAL EXAM:   Vitals Date Temp F BP Pulse Ht In Wt Lb BMI BSA Pain Score  03/29/2019 97.3 137/90 93 66 240 38.74  2/10    PHYSICAL EXAM Details General Level of Distress: no acute distress Overall Appearance: normal   Incision Incision:  clean, dry, intact        IMPRESSION:   Burst fracture with cord injury, well-healed fusion but continued spasticity,  PLAN:  Will re-attempt catheter placement.  She already has pump located.  We can aspirate remaining baclofen, and still new baclofen at the time of we attached the catheter to the pump.  Orders: Instruction(s)/Education: Assessment  Instruction   Tobacco cessation counseling  I10 Hypertension education  [phone] Lifestyle education regarding diet   Completed Orders (this encounter) Order Details Reason Side Interpretation Result Initial Treatment Date Region  Tobacco cessation counseling         Lifestyle education regarding diet Encouraged patient to eat well-balanced diet        Hypertension education Patient will follow-up with Primary Care Physician         Assessment/Plan   # Detail Type Description   1. Assessment Muscle spasticity (M62.838).       2. Assessment Body mass index (BMI) 38.0-38.9, adult [id number]).   Plan Orders Today's instructions / counseling include(s) Lifestyle education regarding diet. Clinical information/comments: Encouraged patient to eat well-balanced diet.       3. Assessment Essential (primary) hypertension (I10).         Pain Management Plan Pain Scale: 2/10. Method: Numeric Pain Intensity Scale. Location: back. Onset: 04/08/2016. Duration: varies. Quality: discomforting. Pain management follow-up plan of care: Patient will continue medication management.              Provider:  Bonna Gains PhD MD  03/29/2019 04:40 PM    Dictation edited by: Bonna Gains    CC Providers: [place] [address] [place],  [place]  [address]-   [name]  [address] [place], [place] [address]-               Electronically signed by Bonna Gains PhD MD on 03/29/2019 04:41 PM

## 2019-04-28 NOTE — Anesthesia Preprocedure Evaluation (Addendum)
Anesthesia Evaluation  Patient identified by MRN, date of birth, ID band Patient awake    Reviewed: Allergy & Precautions, NPO status , Patient's Chart, lab work & pertinent test results  History of Anesthesia Complications (+) PONV and history of anesthetic complications  Airway Mallampati: II  TM Distance: >3 FB Neck ROM: Full    Dental  (+) Dental Advisory Given, Poor Dentition   Pulmonary Current Smoker and Patient abstained from smoking.,    Pulmonary exam normal breath sounds clear to auscultation       Cardiovascular negative cardio ROS Normal cardiovascular exam Rhythm:Regular Rate:Normal     Neuro/Psych PSYCHIATRIC DISORDERS Anxiety Depression Neurogenic bowel Paraplegia  Neuromuscular disease    GI/Hepatic Neg liver ROS, GERD  Medicated and Controlled,  Endo/Other  Hypothyroidism Morbid obesity  Renal/GU negative Renal ROS   Flaccid neuropathic bladder    Musculoskeletal  (+) Arthritis ,   Abdominal   Peds  Hematology negative hematology ROS (+)   Anesthesia Other Findings Day of surgery medications reviewed with the patient.  Reproductive/Obstetrics                           Anesthesia Physical Anesthesia Plan  ASA: III  Anesthesia Plan: General   Post-op Pain Management:    Induction: Intravenous  PONV Risk Score and Plan: 3 and Midazolam, Dexamethasone, Ondansetron, Scopolamine patch - Pre-op and Diphenhydramine  Airway Management Planned: Oral ETT  Additional Equipment:   Intra-op Plan:   Post-operative Plan: Extubation in OR  Informed Consent: I have reviewed the patients History and Physical, chart, labs and discussed the procedure including the risks, benefits and alternatives for the proposed anesthesia with the patient or authorized representative who has indicated his/her understanding and acceptance.     Dental advisory given  Plan Discussed with:  CRNA  Anesthesia Plan Comments:       Anesthesia Quick Evaluation

## 2019-04-29 ENCOUNTER — Encounter (HOSPITAL_COMMUNITY): Payer: Self-pay | Admitting: Anesthesiology

## 2019-04-29 ENCOUNTER — Other Ambulatory Visit: Payer: Self-pay

## 2019-04-29 ENCOUNTER — Ambulatory Visit (HOSPITAL_COMMUNITY): Payer: Medicaid Other | Admitting: Anesthesiology

## 2019-04-29 ENCOUNTER — Observation Stay (HOSPITAL_COMMUNITY)
Admission: RE | Admit: 2019-04-29 | Discharge: 2019-04-30 | Disposition: A | Discharge status: Home / Self Care | Payer: Medicaid Other | Attending: Anesthesiology | Admitting: Anesthesiology

## 2019-04-29 ENCOUNTER — Encounter (HOSPITAL_COMMUNITY)
Admission: RE | Disposition: A | Discharge status: Home / Self Care | Payer: Self-pay | Source: Home / Self Care | Attending: Anesthesiology

## 2019-04-29 ENCOUNTER — Ambulatory Visit (HOSPITAL_COMMUNITY): Payer: Medicaid Other

## 2019-04-29 DIAGNOSIS — M1990 Unspecified osteoarthritis, unspecified site: Secondary | ICD-10-CM | POA: Diagnosis not present

## 2019-04-29 DIAGNOSIS — T148XXS Other injury of unspecified body region, sequela: Secondary | ICD-10-CM | POA: Insufficient documentation

## 2019-04-29 DIAGNOSIS — M62838 Other muscle spasm: Secondary | ICD-10-CM | POA: Diagnosis not present

## 2019-04-29 DIAGNOSIS — Z6841 Body Mass Index (BMI) 40.0 and over, adult: Secondary | ICD-10-CM | POA: Insufficient documentation

## 2019-04-29 DIAGNOSIS — F419 Anxiety disorder, unspecified: Secondary | ICD-10-CM | POA: Insufficient documentation

## 2019-04-29 DIAGNOSIS — E039 Hypothyroidism, unspecified: Secondary | ICD-10-CM | POA: Diagnosis not present

## 2019-04-29 DIAGNOSIS — Z8249 Family history of ischemic heart disease and other diseases of the circulatory system: Secondary | ICD-10-CM | POA: Diagnosis not present

## 2019-04-29 DIAGNOSIS — F329 Major depressive disorder, single episode, unspecified: Secondary | ICD-10-CM | POA: Diagnosis not present

## 2019-04-29 DIAGNOSIS — Z419 Encounter for procedure for purposes other than remedying health state, unspecified: Secondary | ICD-10-CM

## 2019-04-29 DIAGNOSIS — Z79890 Hormone replacement therapy (postmenopausal): Secondary | ICD-10-CM | POA: Diagnosis not present

## 2019-04-29 DIAGNOSIS — G8220 Paraplegia, unspecified: Secondary | ICD-10-CM | POA: Diagnosis present

## 2019-04-29 DIAGNOSIS — N312 Flaccid neuropathic bladder, not elsewhere classified: Secondary | ICD-10-CM | POA: Diagnosis not present

## 2019-04-29 DIAGNOSIS — Z981 Arthrodesis status: Secondary | ICD-10-CM | POA: Diagnosis not present

## 2019-04-29 DIAGNOSIS — Z79899 Other long term (current) drug therapy: Secondary | ICD-10-CM | POA: Diagnosis not present

## 2019-04-29 DIAGNOSIS — G8221 Paraplegia, complete: Secondary | ICD-10-CM | POA: Diagnosis not present

## 2019-04-29 DIAGNOSIS — F17210 Nicotine dependence, cigarettes, uncomplicated: Secondary | ICD-10-CM | POA: Insufficient documentation

## 2019-04-29 DIAGNOSIS — K219 Gastro-esophageal reflux disease without esophagitis: Secondary | ICD-10-CM | POA: Insufficient documentation

## 2019-04-29 HISTORY — PX: INTRATHECAL PUMP IMPLANT: SHX6809

## 2019-04-29 LAB — POCT PREGNANCY, URINE: Preg Test, Ur: NEGATIVE

## 2019-04-29 LAB — CULTURE, BLOOD (ROUTINE X 2)
Culture: NO GROWTH
Culture: NO GROWTH
Special Requests: ADEQUATE

## 2019-04-29 SURGERY — INTRATHECAL PUMP IMPLANT
Anesthesia: General | Site: Thoracic

## 2019-04-29 MED ORDER — LIDOCAINE-EPINEPHRINE 0.5 %-1:200000 IJ SOLN
INTRAMUSCULAR | Status: DC | PRN
  Administered 2019-04-29: 32 mL

## 2019-04-29 MED ORDER — PROPOFOL 10 MG/ML IV BOLUS
INTRAVENOUS | Status: AC
  Filled 2019-04-29: qty 20

## 2019-04-29 MED ORDER — LEVOTHYROXINE SODIUM 88 MCG PO TABS
88.0000 ug | ORAL_TABLET | Freq: Every day | ORAL | Status: DC
  Administered 2019-04-30: 88 ug via ORAL
  Filled 2019-04-29 (×2): qty 1

## 2019-04-29 MED ORDER — SCOPOLAMINE 1 MG/3DAYS TD PT72
1.0000 | MEDICATED_PATCH | Freq: Once | TRANSDERMAL | Status: DC
  Administered 2019-04-29: 1.5 mg via TRANSDERMAL
  Filled 2019-04-29: qty 1

## 2019-04-29 MED ORDER — ACETAMINOPHEN 500 MG PO TABS
1000.0000 mg | ORAL_TABLET | Freq: Once | ORAL | Status: AC
  Administered 2019-04-29: 06:00:00 1000 mg via ORAL
  Filled 2019-04-29: qty 2

## 2019-04-29 MED ORDER — LIDOCAINE-EPINEPHRINE 0.5 %-1:200000 IJ SOLN
INTRAMUSCULAR | Status: AC
  Filled 2019-04-29: qty 1

## 2019-04-29 MED ORDER — CHLORHEXIDINE GLUCONATE CLOTH 2 % EX PADS: 6.0000 | MEDICATED_PAD | Freq: Once | CUTANEOUS | Status: DC

## 2019-04-29 MED ORDER — CEFAZOLIN SODIUM-DEXTROSE 2-4 GM/100ML-% IV SOLN
2.0000 g | INTRAVENOUS | Status: AC
  Administered 2019-04-29: 08:00:00 2 g via INTRAVENOUS
  Filled 2019-04-29: qty 100

## 2019-04-29 MED ORDER — VITAMIN B-12 1000 MCG PO TABS
1000.0000 ug | ORAL_TABLET | Freq: Every day | ORAL | Status: DC
  Administered 2019-04-30: 1000 ug via ORAL
  Filled 2019-04-29: qty 1

## 2019-04-29 MED ORDER — ROCURONIUM BROMIDE 10 MG/ML (PF) SYRINGE
PREFILLED_SYRINGE | INTRAVENOUS | Status: DC | PRN
  Administered 2019-04-29: 10 mg via INTRAVENOUS
  Administered 2019-04-29: 50 mg via INTRAVENOUS

## 2019-04-29 MED ORDER — FENTANYL CITRATE (PF) 100 MCG/2ML IJ SOLN
INTRAMUSCULAR | Status: AC
  Filled 2019-04-29: qty 2

## 2019-04-29 MED ORDER — METHOCARBAMOL 500 MG PO TABS
500.0000 mg | ORAL_TABLET | Freq: Four times a day (QID) | ORAL | Status: DC | PRN
  Administered 2019-04-29 – 2019-04-30 (×3): 500 mg via ORAL
  Filled 2019-04-29 (×2): qty 1

## 2019-04-29 MED ORDER — ONDANSETRON HCL 4 MG/2ML IJ SOLN
INTRAMUSCULAR | Status: DC | PRN
  Administered 2019-04-29: 4 mg via INTRAVENOUS

## 2019-04-29 MED ORDER — METHOCARBAMOL 500 MG PO TABS
ORAL_TABLET | ORAL | Status: AC
  Filled 2019-04-29: qty 1

## 2019-04-29 MED ORDER — SORBITOL 70 % SOLN
30.0000 mL | Freq: Every day | Status: DC | PRN
  Filled 2019-04-29: qty 30

## 2019-04-29 MED ORDER — ASCORBIC ACID 500 MG PO TABS
500.0000 mg | ORAL_TABLET | Freq: Two times a day (BID) | ORAL | Status: DC
  Administered 2019-04-29 – 2019-04-30 (×2): 500 mg via ORAL
  Filled 2019-04-29 (×2): qty 1

## 2019-04-29 MED ORDER — GABAPENTIN 600 MG PO TABS
600.0000 mg | ORAL_TABLET | Freq: Four times a day (QID) | ORAL | Status: DC
  Administered 2019-04-29 – 2019-04-30 (×5): 600 mg via ORAL
  Filled 2019-04-29 (×5): qty 1

## 2019-04-29 MED ORDER — LACTATED RINGERS IV SOLN: INTRAVENOUS | Status: DC | PRN

## 2019-04-29 MED ORDER — FENTANYL CITRATE (PF) 250 MCG/5ML IJ SOLN
INTRAMUSCULAR | Status: DC | PRN
  Administered 2019-04-29: 100 ug via INTRAVENOUS
  Administered 2019-04-29 (×8): 50 ug via INTRAVENOUS

## 2019-04-29 MED ORDER — PROMETHAZINE HCL 25 MG/ML IJ SOLN: 6.2500 mg | INTRAMUSCULAR | Status: DC | PRN

## 2019-04-29 MED ORDER — BACLOFEN 10 MG/20ML IT SOLN
INTRATHECAL | Status: DC | PRN
  Administered 2019-04-29 (×2): 10 mg via INTRATHECAL

## 2019-04-29 MED ORDER — PROPOFOL 10 MG/ML IV BOLUS
INTRAVENOUS | Status: DC | PRN
  Administered 2019-04-29: 30 mg via INTRAVENOUS
  Administered 2019-04-29: 150 mg via INTRAVENOUS
  Administered 2019-04-29: 20 mg via INTRAVENOUS

## 2019-04-29 MED ORDER — CIPROFLOXACIN HCL 500 MG PO TABS
500.0000 mg | ORAL_TABLET | Freq: Two times a day (BID) | ORAL | Status: DC
  Administered 2019-04-29 – 2019-04-30 (×2): 500 mg via ORAL
  Filled 2019-04-29 (×4): qty 1

## 2019-04-29 MED ORDER — ONDANSETRON HCL 4 MG/2ML IJ SOLN
4.0000 mg | Freq: Four times a day (QID) | INTRAMUSCULAR | Status: DC | PRN
  Administered 2019-04-29: 4 mg via INTRAVENOUS
  Filled 2019-04-29: qty 2

## 2019-04-29 MED ORDER — 0.9 % SODIUM CHLORIDE (POUR BTL) OPTIME
TOPICAL | Status: DC | PRN
  Administered 2019-04-29: 1000 mL

## 2019-04-29 MED ORDER — BACITRACIN ZINC 500 UNIT/GM EX OINT
TOPICAL_OINTMENT | CUTANEOUS | Status: DC | PRN
  Administered 2019-04-29: 1 via TOPICAL

## 2019-04-29 MED ORDER — DIPHENHYDRAMINE HCL 50 MG/ML IJ SOLN
INTRAMUSCULAR | Status: DC | PRN
  Administered 2019-04-29: 12.5 mg via INTRAVENOUS

## 2019-04-29 MED ORDER — BACITRACIN ZINC 500 UNIT/GM EX OINT
TOPICAL_OINTMENT | CUTANEOUS | Status: AC
  Filled 2019-04-29: qty 28.35

## 2019-04-29 MED ORDER — ONDANSETRON HCL 4 MG PO TABS: 4.0000 mg | ORAL_TABLET | Freq: Four times a day (QID) | ORAL | Status: DC | PRN

## 2019-04-29 MED ORDER — OXYCODONE HCL 5 MG PO TABS
10.0000 mg | ORAL_TABLET | Freq: Four times a day (QID) | ORAL | Status: DC | PRN
  Administered 2019-04-29 – 2019-04-30 (×6): 10 mg via ORAL
  Filled 2019-04-29 (×6): qty 2

## 2019-04-29 MED ORDER — MIDAZOLAM HCL 2 MG/2ML IJ SOLN
INTRAMUSCULAR | Status: AC
  Filled 2019-04-29: qty 2

## 2019-04-29 MED ORDER — SODIUM CHLORIDE 0.9 % IV SOLN
INTRAVENOUS | Status: DC | PRN
  Administered 2019-04-29: 09:00:00 500 mL

## 2019-04-29 MED ORDER — CITALOPRAM HYDROBROMIDE 20 MG PO TABS
40.0000 mg | ORAL_TABLET | Freq: Every day | ORAL | Status: DC
  Administered 2019-04-30: 40 mg via ORAL
  Filled 2019-04-29: qty 2

## 2019-04-29 MED ORDER — LIDOCAINE 2% (20 MG/ML) 5 ML SYRINGE
INTRAMUSCULAR | Status: DC | PRN
  Administered 2019-04-29: 100 mg via INTRAVENOUS

## 2019-04-29 MED ORDER — MIDAZOLAM HCL 2 MG/2ML IJ SOLN
INTRAMUSCULAR | Status: DC | PRN
  Administered 2019-04-29: 2 mg via INTRAVENOUS

## 2019-04-29 MED ORDER — ONDANSETRON 4 MG PO TBDP: 4.0000 mg | ORAL_TABLET | Freq: Three times a day (TID) | ORAL | Status: DC | PRN

## 2019-04-29 MED ORDER — ACETAMINOPHEN 650 MG RE SUPP: 650.0000 mg | Freq: Four times a day (QID) | RECTAL | Status: DC | PRN

## 2019-04-29 MED ORDER — FENTANYL CITRATE (PF) 250 MCG/5ML IJ SOLN
INTRAMUSCULAR | Status: AC
  Filled 2019-04-29: qty 5

## 2019-04-29 MED ORDER — NITROFURANTOIN MONOHYD MACRO 100 MG PO CAPS
100.0000 mg | ORAL_CAPSULE | Freq: Two times a day (BID) | ORAL | Status: DC
  Administered 2019-04-29 – 2019-04-30 (×2): 100 mg via ORAL
  Filled 2019-04-29 (×3): qty 1

## 2019-04-29 MED ORDER — ALPRAZOLAM 0.25 MG PO TABS
0.2500 mg | ORAL_TABLET | Freq: Three times a day (TID) | ORAL | Status: DC
  Administered 2019-04-29 – 2019-04-30 (×4): 0.25 mg via ORAL
  Filled 2019-04-29 (×4): qty 1

## 2019-04-29 MED ORDER — OXYBUTYNIN CHLORIDE ER 5 MG PO TB24
5.0000 mg | ORAL_TABLET | Freq: Every day | ORAL | Status: DC
  Administered 2019-04-30: 10:00:00 5 mg via ORAL
  Filled 2019-04-29: qty 1

## 2019-04-29 MED ORDER — DEXAMETHASONE SODIUM PHOSPHATE 10 MG/ML IJ SOLN
INTRAMUSCULAR | Status: DC | PRN
  Administered 2019-04-29: 5 mg via INTRAVENOUS

## 2019-04-29 MED ORDER — PANTOPRAZOLE SODIUM 40 MG PO TBEC
40.0000 mg | DELAYED_RELEASE_TABLET | Freq: Every day | ORAL | Status: DC
  Administered 2019-04-30: 40 mg via ORAL
  Filled 2019-04-29: qty 1

## 2019-04-29 MED ORDER — FENTANYL CITRATE (PF) 100 MCG/2ML IJ SOLN
25.0000 ug | INTRAMUSCULAR | Status: DC | PRN
  Administered 2019-04-29 (×3): 50 ug via INTRAVENOUS

## 2019-04-29 MED ORDER — ACETAMINOPHEN 325 MG PO TABS: 650.0000 mg | ORAL_TABLET | Freq: Four times a day (QID) | ORAL | Status: DC | PRN

## 2019-04-29 SURGICAL SUPPLY — 68 items
BAG DECANTER FOR FLEXI CONT (MISCELLANEOUS) ×3 IMPLANT
BINDER ABDOMINAL 12 ML 46-62 (SOFTGOODS) ×3 IMPLANT
BLADE CLIPPER SURG (BLADE) ×1 IMPLANT
BOOT SUTURE AID YELLOW STND (SUTURE) ×3 IMPLANT
CATH ASCENDA 1PIECE (Catheter) ×2 IMPLANT
CHLORAPREP W/TINT 26 (MISCELLANEOUS) ×5 IMPLANT
CLIP VESOCCLUDE MED 6/CT (CLIP) ×2 IMPLANT
COVER WAND RF STERILE (DRAPES) ×1 IMPLANT
DERMABOND ADVANCED (GAUZE/BANDAGES/DRESSINGS)
DERMABOND ADVANCED .7 DNX12 (GAUZE/BANDAGES/DRESSINGS) IMPLANT
DRAPE C-ARMOR (DRAPES) ×3 IMPLANT
DRAPE INCISE IOBAN 66X45 STRL (DRAPES) ×2 IMPLANT
DRAPE LAPAROTOMY T 102X78X121 (DRAPES) IMPLANT
DRAPE ORTHO SPLIT 77X108 STRL (DRAPES) ×4
DRAPE SURG 17X23 STRL (DRAPES) ×12 IMPLANT
DRAPE SURG ORHT 6 SPLT 77X108 (DRAPES) ×2 IMPLANT
DRSG OPSITE POSTOP 4X6 (GAUZE/BANDAGES/DRESSINGS) ×4 IMPLANT
ELECT REM PT RETURN 9FT ADLT (ELECTROSURGICAL) ×3
ELECTRODE REM PT RTRN 9FT ADLT (ELECTROSURGICAL) ×1 IMPLANT
GAUZE 4X4 16PLY RFD (DISPOSABLE) IMPLANT
GLOVE BIO SURGEON STRL SZ8 (GLOVE) ×2 IMPLANT
GLOVE BIOGEL PI IND STRL 6.5 (GLOVE) IMPLANT
GLOVE BIOGEL PI IND STRL 7.5 (GLOVE) ×1 IMPLANT
GLOVE BIOGEL PI IND STRL 8.5 (GLOVE) IMPLANT
GLOVE BIOGEL PI INDICATOR 6.5 (GLOVE) ×2
GLOVE BIOGEL PI INDICATOR 7.5 (GLOVE) ×2
GLOVE BIOGEL PI INDICATOR 8.5 (GLOVE) ×2
GLOVE ECLIPSE 7.5 STRL STRAW (GLOVE) ×2 IMPLANT
GLOVE SURG SS PI 6.0 STRL IVOR (GLOVE) ×4 IMPLANT
GOWN STRL REUS W/ TWL LRG LVL3 (GOWN DISPOSABLE) IMPLANT
GOWN STRL REUS W/ TWL XL LVL3 (GOWN DISPOSABLE) IMPLANT
GOWN STRL REUS W/TWL 2XL LVL3 (GOWN DISPOSABLE) IMPLANT
GOWN STRL REUS W/TWL LRG LVL3 (GOWN DISPOSABLE) ×4
GOWN STRL REUS W/TWL XL LVL3 (GOWN DISPOSABLE) ×2
KIT BASIN OR (CUSTOM PROCEDURE TRAY) ×4 IMPLANT
KIT CATHETER ACCESS PORT (KITS) ×2 IMPLANT
KIT REFILL (MISCELLANEOUS) ×2
KIT REFILL CATH SYNCHROMED II (MISCELLANEOUS) IMPLANT
KIT TURNOVER KIT B (KITS) ×3 IMPLANT
KIT VASOVIEW 6 PRO VH 2400 (KITS) ×2 IMPLANT
NDL HYPO 18GX1.5 BLUNT FILL (NEEDLE) ×1 IMPLANT
NDL HYPO 25X1 1.5 SAFETY (NEEDLE) ×1 IMPLANT
NEEDLE HYPO 18GX1.5 BLUNT FILL (NEEDLE) IMPLANT
NEEDLE HYPO 25X1 1.5 SAFETY (NEEDLE) ×3 IMPLANT
NS IRRIG 1000ML POUR BTL (IV SOLUTION) ×3 IMPLANT
PACK LAMINECTOMY NEURO (CUSTOM PROCEDURE TRAY) ×3 IMPLANT
PAD ARMBOARD 7.5X6 YLW CONV (MISCELLANEOUS) ×21 IMPLANT
PASSER CATH 38CM DISP (INSTRUMENTS) ×2 IMPLANT
POUCH TYRX ANTIBAC NEURO LRG (Mesh General) IMPLANT
POUCH TYRX NEURO LRG (Mesh General) ×3 IMPLANT
SPONGE LAP 4X18 RFD (DISPOSABLE) IMPLANT
SPONGE SURGIFOAM ABS GEL SZ50 (HEMOSTASIS) IMPLANT
STAPLER SKIN PROX WIDE 3.9 (STAPLE) ×5 IMPLANT
SUT MNCRL AB 4-0 PS2 18 (SUTURE) ×3 IMPLANT
SUT SILK 0 (SUTURE) ×2
SUT SILK 0 MO-6 18XCR BRD 8 (SUTURE) ×1 IMPLANT
SUT SILK 0 TIES 10X30 (SUTURE) IMPLANT
SUT SILK 2 0 TIES 10X30 (SUTURE) IMPLANT
SUT VIC AB 0 CT1 18XCR BRD8 (SUTURE) IMPLANT
SUT VIC AB 0 CT1 8-18 (SUTURE) ×4
SUT VIC AB 2-0 CP2 18 (SUTURE) ×6 IMPLANT
SYR 10ML LL (SYRINGE) IMPLANT
SYR 20ML LL LF (SYRINGE) ×1 IMPLANT
SYR 3ML LL SCALE MARK (SYRINGE) ×1 IMPLANT
TOWEL GREEN STERILE (TOWEL DISPOSABLE) ×3 IMPLANT
TOWEL GREEN STERILE FF (TOWEL DISPOSABLE) ×3 IMPLANT
WATER STERILE IRR 1000ML POUR (IV SOLUTION) ×3 IMPLANT
YANKAUER SUCT BULB TIP NO VENT (SUCTIONS) ×3 IMPLANT

## 2019-04-29 NOTE — Anesthesia Postprocedure Evaluation (Signed)
Anesthesia Post Note  Patient: [name]  Procedure(s) Performed: Intrathecal catheter placement (N/A Thoracic)     Patient location during evaluation: PACU Anesthesia Type: General Level of consciousness: awake and alert Pain management: pain level controlled Vital Signs Assessment: post-procedure vital signs reviewed and stable Respiratory status: spontaneous breathing, nonlabored ventilation and respiratory function stable Cardiovascular status: blood pressure returned to baseline and stable Postop Assessment: no apparent nausea or vomiting Anesthetic complications: no    Last Vitals:  Vitals:   04/29/19 1005 04/29/19 1020  BP: (!) 140/94 132/88  Pulse: 100 91  Resp: 17 13  Temp:    SpO2: 97% 96%    Last Pain:  Vitals:   04/29/19 1219  TempSrc:   PainSc: 4                  Catalina Gravel

## 2019-04-29 NOTE — Anesthesia Procedure Notes (Signed)
Procedure Name: Intubation Date/Time: 04/29/2019 7:30 AM Performed by: Janace Litten, CRNA Pre-anesthesia Checklist: Patient identified, Emergency Drugs available, Suction available and Patient being monitored Patient Re-evaluated:Patient Re-evaluated prior to induction Oxygen Delivery Method: Circle System Utilized Preoxygenation: Pre-oxygenation with 100% oxygen Induction Type: IV induction Ventilation: Mask ventilation without difficulty Laryngoscope Size: Mac and 3 Grade View: Grade I Tube type: Oral Tube size: 7.0 mm Number of attempts: 1 Airway Equipment and Method: Stylet Placement Confirmation: ETT inserted through vocal cords under direct vision,  positive ETCO2 and breath sounds checked- equal and bilateral Secured at: 22 cm Tube secured with: Tape Dental Injury: Teeth and Oropharynx as per pre-operative assessment

## 2019-04-29 NOTE — TOC Progression Note (Addendum)
Transition of Care [facility]) - Progression Note    Patient Details  Name: [name] MRN: [id number] Date of Birth: [birth year]  Transition of Care [facility]) CM/SW Contact  Zenon Mayo, RN Phone Number: 04/29/2019, 2:01 PM  Clinical Narrative:    NCM spoke with patient, she states she will need [facility]tar transport home tomorrow, she usually uses [facility] transport but that do not transport on weekends. NCM will put forms on chart, please call to schedule transport with [facility]tar [phone] when she is ready for discharge.  Address confirmed.        Expected Discharge Plan and Services                                                 Social Determinants of Health (SDOH) Interventions    Readmission Risk Interventions Readmission Risk Prevention Plan 01/12/2019 01/12/2019 11/12/2018  Transportation Screening (No Data) Complete -  PCP or Specialist Appt within 3-5 Days - Complete Not Complete  HRI or [place] - Complete -  Social Work Consult for [place] Planning/Counseling - Complete -  Palliative Care Screening - Not Applicable Not Applicable  Medication Review (RN Care Manager) - Complete -  Some recent data might be hidden

## 2019-04-29 NOTE — Discharge Instructions (Signed)
Dr. Pharaoh Pio Post-Op Orders ° °• Ice Pack - 20 minutes on (in a pillow case), and 20 minutes off. Wear the ice pack UNDER the binder. °• Follow up in office, they will call you for an appointment in 10 days to 2 weeks. °• Increase activity gradually.   °• No lifting anything heavier than a gallon of milk (10 pounds) until seen in the office. °• Advance diet slowly as tolerated. °• Dressing care:  Keep dressing dry for 3 days, and on Post-op day 4, may shower. °• Call for fever, drainage, and redness. °• No swimming or bathing in a bathtub (do not get into standing water). °•  °

## 2019-04-29 NOTE — Transfer of Care (Signed)
Immediate Anesthesia Transfer of Care Note  Patient: [name]  Procedure(s) Performed: Intrathecal catheter placement (N/A Thoracic)  Patient Location: PACU  Anesthesia Type:General  Level of Consciousness: awake, alert  and patient cooperative  Airway & Oxygen Therapy: Patient Spontanous Breathing  Post-op Assessment: Report given to RN and Post -op Vital signs reviewed and stable  Post vital signs: Reviewed and stable  Last Vitals:  Vitals Value Taken Time  BP 142/99 04/29/19 0933  Temp    Pulse 90 04/29/19 0936  Resp 12 04/29/19 0936  SpO2 99 % 04/29/19 0936  Vitals shown include unvalidated device data.  Last Pain:  Vitals:   04/29/19 0608  TempSrc:   PainSc: 8       Patients Stated Pain Goal: 3 ([id number])  Complications: No apparent anesthesia complications
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
## 2019-04-29 NOTE — Op Note (Signed)
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 The patient was taken to the OR, a plane of general anesthesia induced, and the patient intubated by the anesthesiology staff. The patient was then positioned in a right lateral decubitus position on the OR table, an axillary roll placed, and extreme care taken to position the patient so that all pressure points were padded, limbs and head all in a neutral position. The patient's abdomen, flank and lumbar spine were prepped with betadine scrub and chloraprep, and draped into a sterile field. A timeout was taken to verify the appropriate patient, position, procedure, availability of equipment and personnel, and administration of perioperative antibiotics.   Catheter placement was accomplished by Dr. Vertell Limber.  He will dictate his seizure under separate note.  After fixing the catheter in place, rubber-shod clamp was placed at the end of the catheter.  Attention was then turned toleftlower abdominal quadrant, pump had been placed previously.  Sharply entered through the previous incision line,, and the subcutaneous pocket dissected open, and the pump removed.. The pocket was carefully inspected, and hemostasis was assured.  On the back table, the old baclofen that was in the pump was aspirated, and 40cc fresh preservative-free baclofen, 500 mcg/cc was reinstilled. A reverse Seldinger technique was then utilized to pass the intrathecal catheter to the pump pocket.    The catheter itself was then attached to a side port assembly, and then attached to the side port of the pump itself. CSF flow was maintained throughout all steps, and from the side aspiration port itself  at the conclusion of all the connections being made.   With now a functional pump/catheter assembly, the pocket was re-inspected and found to be hemostatic. 0 silk sutures were placed in the medial and lateral corners of the incision, and another interrupted 2--0 silk suture placed in the inferolateral aspect of the pocket. The residual catheter was carefully coiled on the posterior face of the pump, and these sutures were then placed through the retention angles of the pump itself, with care taken to not incorporate or tie off the catheter into the sutures when they were tied; the pump was fixed into place with the sutures in an orientation with the side port directed cephalad, or 12 o'clock position. An antiobiotic-eluting mesh patch was placed posterior to the pump itself  Thelumbar incisionwas then copiously irrigated with bacitracin-containing irrigation, and the incision closed witha deep layerof 1-0 interrupted vicryl sutures, dermis closed with running 2-0 Monocryl and the skin closed with staples.  Bacitracin and sterile dressing were applied.   Thepocket was then copiously irrigated with bacitracin-containing irrigation, and the incision closed witha deep layerof 2-0 interrupted vicryl sutures, dermis approximated with a running 2-0 Vicryl, and the skin closed withstaples.Sterile, occlusive dressingswere placed. Needle, instrument and sponge counts were correct x2 at the end of the case.   The pump was programmed then to run a simple continuous infusionofofbaclofen at a total of 100 mcg/24 hours.  The patient was turned onto a stretcher, extubated by the anesthesiology team, and taken to PACU in stable condition.   DRAINS: none  COMPLICATIONS: none CONDITION: stable throughout, and to the PACU  DISPOSITION:plan 24-hour observation. Follow up in 2 weeks for pump adjustment and wound check.Case discussed  with the patient's family

## 2019-04-30 DIAGNOSIS — G8221 Paraplegia, complete: Secondary | ICD-10-CM | POA: Diagnosis not present

## 2019-04-30 MED ORDER — CHLORHEXIDINE GLUCONATE CLOTH 2 % EX PADS
6.0000 | MEDICATED_PAD | Freq: Every day | CUTANEOUS | Status: DC
  Administered 2019-04-30: 08:00:00 6 via TOPICAL

## 2019-04-30 MED ORDER — OXYCODONE HCL 10 MG PO TABS: 15.0000 mg | ORAL_TABLET | Freq: Four times a day (QID) | ORAL | 0 refills | Status: DC | PRN

## 2019-04-30 NOTE — Progress Notes (Signed)
Patient alert and oriented, mae's well, voiding adequate amount of urine through the catheter, swallowing without difficulty,  pain medication given at time of discharge. Patient discharged home with PTAR. Discharged instructions given to patient. Patient  stated understanding of instructions given. Patient has an appointment with Dr. Maryjean Ka

## 2019-04-30 NOTE — Progress Notes (Signed)
Orthopedic Tech Progress Note Patient Details:  [name] [birth year] [id number] RN called requesting an ABDOMINAL BINDER for patient. Patient Is on contact, RN said she'll apply BINDER Ortho Devices Type of Ortho Device: Abdominal binder Ortho Device/Splint Interventions: Other (comment)   Post Interventions Patient Tolerated: Other (comment) Instructions Provided: Other (comment)   [name] 04/30/2019, 3:13 PM

## 2019-04-30 NOTE — Progress Notes (Signed)
Neurosurgery Service Progress Note  Subjective: No acute events overnight, incisional pain at both sites, otherwise no new complaints   Objective: Vitals:   04/30/19 0017 04/30/19 0518 04/30/19 0759 04/30/19 1228  BP: 121/80 122/77 107/77 127/77  Pulse: (!) 59 62 64 (!) 57  Resp: 20 18 16 18   Temp: 98.2 F (36.8 C) 98 F (36.7 C) 98.2 F (36.8 C) 98.7 F (37.1 C)  TempSrc: Oral Oral Oral   SpO2: 98% 96% 97% 98%  Weight:      Height:       Temp (24hrs), Avg:98.2 F (36.8 C), Min:97.8 F (36.6 C), Max:98.7 F (37.1 C)  CBC Latest Ref Rng & Units 04/24/2019 03/21/2019 03/19/2019  WBC 4.0 - 10.5 K/uL 11.7(H) 12.0(H) 10.2  Hemoglobin 12.0 - 15.0 g/dL 14.1 12.4 12.7  Hematocrit 36.0 - 46.0 % 45.5 40.5 42.1  Platelets 150 - 400 K/uL 551(H) 427(H) 451(H)   BMP Latest Ref Rng & Units 04/24/2019 03/22/2019 03/21/2019  Glucose 70 - 99 mg/dL 81 82 83  BUN 6 - 20 mg/dL 7 10 7   Creatinine 0.44 - 1.00 mg/dL 0.45 0.44 0.48  Sodium 135 - 145 mmol/L 136 136 137  Potassium 3.5 - 5.1 mmol/L 3.5 3.5 3.1(L)  Chloride 98 - 111 mmol/L 101 100 104  CO2 22 - 32 mmol/L 27 28 25   Calcium 8.9 - 10.3 mg/dL 8.9 8.4(L) 8.5(L)    Intake/Output Summary (Last 24 hours) at 04/30/2019 1448 Last data filed at 04/30/2019 1249 Gross per 24 hour  Intake 240 ml  Output 2050 ml  Net -1810 ml    Current Facility-Administered Medications:  .  acetaminophen (TYLENOL) tablet 650 mg, 650 mg, Oral, Q6H PRN **OR** acetaminophen (TYLENOL) suppository 650 mg, 650 mg, Rectal, Q6H PRN, Clydell Hakim, MD .  ALPRAZolam [name]) tablet 0.25 mg, 0.25 mg, Oral, TID, Clydell Hakim, MD, 0.25 mg at 04/30/19 0811 .  ascorbic acid (VITAMIN C) tablet 500 mg, 500 mg, Oral, BID, Clydell Hakim, MD, 500 mg at 04/30/19 0942 .  Chlorhexidine Gluconate Cloth 2 % PADS 6 each, 6 each, Topical, Daily, Clydell Hakim, MD, 6 each at 04/30/19 [id number] .  ciprofloxacin (CIPRO) tablet 500 mg, 500 mg, Oral, BID, Clydell Hakim, MD, 500 mg at 04/30/19  [id number] .  citalopram (CELEXA) tablet 40 mg, 40 mg, Oral, Daily, Clydell Hakim, MD, 40 mg at 04/30/19 [id number] .  gabapentin (NEURONTIN) tablet 600 mg, 600 mg, Oral, 4x daily, Clydell Hakim, MD, 600 mg at 04/30/19 1222 .  levothyroxine (SYNTHROID) tablet 88 mcg, 88 mcg, Oral, QAC breakfast, Clydell Hakim, MD, 88 mcg at 04/30/19 0532 .  methocarbamol (ROBAXIN) tablet 500 mg, 500 mg, Oral, Q6H PRN, Clydell Hakim, MD, 500 mg at 04/30/19 0533 .  nitrofurantoin (macrocrystal-monohydrate) (MACROBID) capsule 100 mg, 100 mg, Oral, BID, Clydell Hakim, MD, 100 mg at 04/30/19 0820 .  ondansetron (ZOFRAN) tablet 4 mg, 4 mg, Oral, Q6H PRN **OR** ondansetron (ZOFRAN) injection 4 mg, 4 mg, Intravenous, Q6H PRN, Clydell Hakim, MD, 4 mg at 04/29/19 1118 .  oxybutynin (DITROPAN-XL) 24 hr tablet 5 mg, 5 mg, Oral, Daily, Clydell Hakim, MD, 5 mg at 04/30/19 0942 .  oxyCODONE (Oxy IR/ROXICODONE) immediate release tablet 10 mg, 10 mg, Oral, Q6H PRN, Clydell Hakim, MD, 10 mg at 04/30/19 1222 .  pantoprazole (PROTONIX) EC tablet 40 mg, 40 mg, Oral, Daily, Clydell Hakim, MD, 40 mg at 04/30/19 0942 .  sorbitol 70 % solution 30 mL, 30 mL, Oral, Daily PRN, Clydell Hakim, MD .  vitamin B-12 (CYANOCOBALAMIN) tablet 1,000 mcg, 1,000 mcg, Oral, Daily, Clydell Hakim, MD, 1,000 mcg at 04/30/19 [id number]   Physical Exam: Ventral/dorsal sites c/d/i with dressings  Assessment & Plan: 39 y.o. woman s/p ITB pump placement for BLE spasticity, recovering well.  -discharge home today  Judith Part  04/30/19 2:48 PM

## 2019-04-30 NOTE — Plan of Care (Signed)
ADEQUATE FOR DISCHARGED

## 2019-04-30 NOTE — Discharge Summary (Signed)
Discharge Summary  Date of Admission: 04/29/2019  Date of Discharge: 04/30/19  Attending Physician: Clydell Hakim, MD  Hospital Course: Patient was admitted following an uncomplicated intrathecal baclofen pump placement. She was recovered in PACU and transferred to [facility]. Her hospital course was uncomplicated and the patient was discharged home on 04/30/2019.   Discharge diagnosis: Spasticity  Judith Part, MD 04/30/19 2:52 PM

## 2019-05-02 ENCOUNTER — Encounter: Payer: Self-pay | Admitting: *Deleted

## 2019-05-06 ENCOUNTER — Encounter (HOSPITAL_BASED_OUTPATIENT_CLINIC_OR_DEPARTMENT_OTHER): Payer: Medicaid Other | Admitting: Internal Medicine

## 2019-05-13 ENCOUNTER — Encounter (HOSPITAL_BASED_OUTPATIENT_CLINIC_OR_DEPARTMENT_OTHER): Payer: Medicaid Other | Attending: Internal Medicine | Admitting: Internal Medicine

## 2019-05-13 ENCOUNTER — Other Ambulatory Visit: Payer: Self-pay

## 2019-05-13 DIAGNOSIS — G8222 Paraplegia, incomplete: Secondary | ICD-10-CM | POA: Insufficient documentation

## 2019-05-13 DIAGNOSIS — L89154 Pressure ulcer of sacral region, stage 4: Secondary | ICD-10-CM | POA: Diagnosis present

## 2019-05-16 NOTE — Progress Notes (Signed)
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

## 2019-05-16 NOTE — Progress Notes (Addendum)
[name], [name] ([id number]) Visit Report for 05/13/2019 Arrival Information Details Patient Name: Date of Service: [name], [name] 05/13/2019 12:30 PM Medical Record [id number] Patient Account Number: [id number] Date of Birth/Sex: Treating RN: 10/10/1980 (39 y.o. Orvan Falconer Primary Care Renne Platts: SYSTEM, PCP Other Clinician: Referring Vale Peraza: Treating Opie Fanton/Extender:Robson, Forestine Chute, Junius Roads Weeks in Treatment: 150 Visit Information History Since Last Visit All ordered tests and consults were completed: No Patient Arrived: Wheel Chair Added or deleted any medications: No Arrival Time: 12:55 Any new allergies or adverse reactions: No Accompanied By: self Had a fall or experienced change in No activities of daily living that may affect Transfer Assistance: Harrel Lemon Lift risk of falls: Patient Identification Verified: Yes Signs or symptoms of abuse/neglect since last No Secondary Verification Process Completed: Yes visito Patient Requires Transmission-Based No Hospitalized since last visit: No Precautions: Implantable device outside of the clinic excluding No Patient Has Alerts: No cellular tissue based products placed in the center since last visit: Has Dressing in Place as Prescribed: Yes Pain Present Now: No Electronic Signature(s) Signed: 05/13/2019 5:30:50 PM By: Carlene Coria RN Entered By: Carlene Coria on 05/13/2019 12:55:31 -------------------------------------------------------------------------------- Clinic Level of Care Assessment Details Patient Name: Date of Service: [name], [name] 05/13/2019 12:30 PM Medical Record [id number] Patient Account Number: [id number] Date of Birth/Sex: Treating RN: December 20, 1980 (39 y.o. Clearnce Sorrel Primary Care Diago Haik: SYSTEM, PCP Other Clinician: Referring Taurus Willis: Treating Nykeria Mealing/Extender:Robson, Forestine Chute, Lamar Blinks in Treatment: 150 Clinic Level of Care Assessment  Items TOOL 4 Quantity Score X - Use when only an EandM is performed on FOLLOW-UP visit 1 0 ASSESSMENTS - Nursing Assessment / Reassessment X - Reassessment of Co-morbidities (includes updates in patient status) 1 10 X - Reassessment of Adherence to Treatment Plan 1 5 ASSESSMENTS - Wound and Skin Assessment / Reassessment X - Simple Wound Assessment / Reassessment - one wound 1 5 '[]'  - Complex Wound Assessment / Reassessment - multiple wounds 0 '[]'  - Dermatologic / Skin Assessment (not related to wound area) 0 ASSESSMENTS - Focused Assessment '[]'  - Circumferential Edema Measurements - multi extremities 0 '[]'  - Nutritional Assessment / Counseling / Intervention 0 '[]'  - Lower Extremity Assessment (monofilament, tuning fork, pulses) 0 '[]'  - Peripheral Arterial Disease Assessment (using hand held doppler) 0 ASSESSMENTS - Ostomy and/or Continence Assessment and Care '[]'  - Incontinence Assessment and Management 0 '[]'  - Ostomy Care Assessment and Management (repouching, etc.) 0 PROCESS - Coordination of Care X - Simple Patient / Family Education for ongoing care 1 15 '[]'  - Complex (extensive) Patient / Family Education for ongoing care 0 X - Staff obtains Programmer, systems, Records, Test Results / Process Orders 1 10 X - Staff telephones HHA, Nursing Homes / Clarify orders / etc 1 10 '[]'  - Routine Transfer to another Facility (non-emergent condition) 0 '[]'  - Routine Hospital Admission (non-emergent condition) 0 '[]'  - New Admissions / Biomedical engineer / Ordering NPWT, Apligraf, etc. 0 '[]'  - Emergency Hospital Admission (emergent condition) 0 X - Simple Discharge Coordination 1 10 '[]'  - Complex (extensive) Discharge Coordination 0 PROCESS - Special Needs '[]'  - Pediatric / Minor Patient Management 0 '[]'  - Isolation Patient Management 0 '[]'  - Hearing / Language / Visual special needs 0 '[]'  - Assessment of Community assistance (transportation, D/C planning, etc.) 0 '[]'  - Additional assistance / Altered mentation  0 '[]'  - Support Surface(s) Assessment (bed, cushion, seat, etc.) 0 INTERVENTIONS - Wound Cleansing / Measurement X - Simple Wound Cleansing - one wound 1 5 '[]'  - Complex  Wound Cleansing - multiple wounds 0 X - Wound Imaging (photographs - any number of wounds) 1 5 '[]'  - Wound Tracing (instead of photographs) 0 X - Simple Wound Measurement - one wound 1 5 '[]'  - Complex Wound Measurement - multiple wounds 0 INTERVENTIONS - Wound Dressings X - Small Wound Dressing one or multiple wounds 1 10 '[]'  - Medium Wound Dressing one or multiple wounds 0 '[]'  - Large Wound Dressing one or multiple wounds 0 X - Application of Medications - topical 1 5 '[]'  - Application of Medications - injection 0 INTERVENTIONS - Miscellaneous '[]'  - External ear exam 0 '[]'  - Specimen Collection (cultures, biopsies, blood, body fluids, etc.) 0 '[]'  - Specimen(s) / Culture(s) sent or taken to Lab for analysis 0 X - Patient Transfer (multiple staff / Civil Service fast streamer / Similar devices) 1 10 '[]'  - Simple Staple / Suture removal (25 or less) 0 '[]'  - Complex Staple / Suture removal (26 or more) 0 '[]'  - Hypo / Hyperglycemic Management (close monitor of Blood Glucose) 0 '[]'  - Ankle / Brachial Index (ABI) - do not check if billed separately 0 X - Vital Signs 1 5 Has the patient been seen at the hospital within the last three years: Yes Total Score: 110 Level Of Care: New/Established - Level 3 Electronic Signature(s) Signed: 05/13/2019 5:31:36 PM By: Kela Millin Entered By: Kela Millin on 05/13/2019 13:17:53 -------------------------------------------------------------------------------- Encounter Discharge Information Details Patient Name: Date of Service: Jamie Jews A. 05/13/2019 12:30 PM Medical Record [id number] Patient Account Number: [id number] Date of Birth/Sex: Treating RN: 1980/05/24 (39 y.o. [name] Primary Care Marquise Lambson: SYSTEM, PCP Other Clinician: Referring Syndey Jaskolski: Treating  Jaemarie Hochberg/Extender:Robson, Forestine Chute, Junius Roads Weeks in Treatment: 150 Encounter Discharge Information Items Discharge Condition: Stable Ambulatory Status: Wheelchair Discharge Destination: Home Transportation: Private Auto Accompanied By: family member Schedule Follow-up Appointment: Yes Clinical Summary of Care: Electronic Signature(s) Signed: 05/13/2019 5:45:00 PM By: Deon Pilling Entered By: Deon Pilling on 05/13/2019 13:40:05 -------------------------------------------------------------------------------- Lower Extremity Assessment Details Patient Name: Date of Service: [name], [name] 05/13/2019 12:30 PM Medical Record [id number] Patient Account Number: [id number] Date of Birth/Sex: Treating RN: [birth year] (39 y.o. Clearnce Sorrel Primary Care Celsa Nordahl: SYSTEM, PCP Other Clinician: Referring Brianna Bennett: Treating Jaquaveon Bilal/Extender:Robson, Forestine Chute, Junius Roads Weeks in Treatment: 150 Electronic Signature(s) Signed: 05/13/2019 5:31:36 PM By: Kela Millin Entered By: Kela Millin on 05/13/2019 13:14:27 -------------------------------------------------------------------------------- Multi Wound Chart Details Patient Name: Date of Service: Jamie Jews A. 05/13/2019 12:30 PM Medical Record [id number] Patient Account Number: [id number] Date of Birth/Sex: Treating RN: December 22, 1980 (39 y.o. Clearnce Sorrel Primary Care Glendi Mohiuddin: SYSTEM, PCP Other Clinician: Referring Peachie Barkalow: Treating Lakeitha Basques/Extender:Robson, Forestine Chute, Junius Roads Weeks in Treatment: 150 Vital Signs Height(in): 65 Pulse(bpm): 96 Weight(lbs): 201 Blood Pressure(mmHg): 97/60 Body Mass Index(BMI): 33 Temperature(F): 98.5 Respiratory 18 Rate(breaths/min): Photos: [1:No Photos] [N/A:N/A] Wound Location: [1:Sacrum - Medial] [N/A:N/A] Wounding Event: [1:Pressure Injury] [N/A:N/A] Primary Etiology: [1:Pressure Ulcer] [N/A:N/A] Comorbid History: [1:Anemia,  Osteomyelitis] [N/A:N/A] Date Acquired: [1:05/15/2016] [N/A:N/A] Weeks of Treatment: [1:150] [N/A:N/A] Wound Status: [1:Open] [N/A:N/A] Measurements L x W x D [1:1x1.5x2] [N/A:N/A] (cm) Area (cm) : [1:1.178] [N/A:N/A] Volume (cm) : [1:2.356] [N/A:N/A] % Reduction in Area: [1:96.40%] [N/A:N/A] % Reduction in Volume: [1:98.40%] [N/A:N/A] Classification: [1:Category/Stage IV] [N/A:N/A] Exudate Amount: [1:Medium] [N/A:N/A] Exudate Type: [1:Serosanguineous] [N/A:N/A] Exudate Color: [1:red, brown] [N/A:N/A] Wound Margin: [1:Epibole] [N/A:N/A] Granulation Amount: [1:Large (67-100%)] [N/A:N/A] Granulation Quality: [1:Pink, Pale] [N/A:N/A] Necrotic Amount: [1:None Present (0%)] [N/A:N/A] Exposed Structures: [1:Fat Layer (Subcutaneous Tissue) Exposed: Yes Fascia: No Tendon: No Muscle: No Joint: No Bone:  No Medium (34-66%)] [N/A:N/A N/A] Treatment Notes Electronic Signature(s) Signed: 05/13/2019 5:31:36 PM By: Kela Millin Signed: 05/16/2019 8:43:01 AM By: Linton Ham MD Entered By: Linton Ham on 05/13/2019 13:23:03 -------------------------------------------------------------------------------- Multi-Disciplinary Care Plan Details Patient Name: Date of Service: Jamie Jews A. 05/13/2019 12:30 PM Medical Record [id number] Patient Account Number: [id number] Date of Birth/Sex: Treating RN: [birth year] (39 y.o. Clearnce Sorrel Primary Care Ethridge Sollenberger: SYSTEM, PCP Other Clinician: Referring Arslan Kier: Treating Lochlan Grygiel/Extender:Robson, Forestine Chute, Junius Roads Weeks in Treatment: 150 Active Inactive Pressure Nursing Diagnoses: Knowledge deficit related to management of pressures ulcers Potential for impaired tissue integrity related to pressure, friction, moisture, and shear Goals: Patient will remain free from development of additional pressure ulcers Target Resolution Date Initiated: 06/27/2016 Date Inactivated: 01/17/2019 Date: 12/24/2018 Goal Status:  Met Patient/caregiver will verbalize risk factors for pressure ulcer development Date Initiated: 06/27/2016 Date Inactivated: 07/08/2017 Target Resolution Date: 07/08/2017 Goal Status: Met Patient/caregiver will verbalize understanding of pressure ulcer management Date Initiated: 06/27/2016 Target Resolution Date: 06/17/2019 Goal Status: Active Interventions: Assess: immobility, friction, shearing, incontinence upon admission and as needed Assess offloading mechanisms upon admission and as needed Assess potential for pressure ulcer upon admission and as needed Provide education on pressure ulcers Treatment Activities: Patient referred for pressure reduction/relief devices : 06/27/2016 Pressure reduction/relief device ordered : 06/27/2016 Notes: Wound/Skin Impairment Nursing Diagnoses: Impaired tissue integrity Knowledge deficit related to smoking impact on wound healing Knowledge deficit related to ulceration/compromised skin integrity Goals: Patient will demonstrate a reduced rate of smoking or cessation of smoking Date Initiated: 06/27/2016 Date Inactivated: 01/26/2017 Target Resolution Date: 01/08/2017 Unmet Reason: pt continues Goal Status: Unmet to smoke Patient/caregiver will verbalize understanding of skin care regimen Date Initiated: 06/27/2016 Target Resolution Date: 06/17/2019 Goal Status: Active Ulcer/skin breakdown will have a volume reduction of 50% by week 8 Date Initiated: 06/27/2016 Date Inactivated: 12/11/2016 Target Resolution Date: 07/25/2016 Goal Status: Met Interventions: Assess patient/caregiver ability to obtain necessary supplies Assess patient/caregiver ability to perform ulcer/skin care regimen upon admission and as needed Assess ulceration(s) every visit Provide education on smoking Provide education on ulcer and skin care Treatment Activities: Skin care regimen initiated : 06/27/2016 Topical wound management initiated : 06/27/2016 Notes: Electronic  Signature(s) Signed: 05/13/2019 5:31:36 PM By: Kela Millin Entered By: Kela Millin on 05/13/2019 12:54:33 -------------------------------------------------------------------------------- Pain Assessment Details Patient Name: Date of Service: [name], [name] 05/13/2019 12:30 PM Medical Record [id number] Patient Account Number: [id number] Date of Birth/Sex: Treating RN: 1980-03-14 (39 y.o. Orvan Falconer Primary Care Kylian Loh: SYSTEM, PCP Other Clinician: Referring Khayri Kargbo: Treating Khaliah Barnick/Extender:Robson, Forestine Chute, Junius Roads Weeks in Treatment: 150 Active Problems Location of Pain Severity and Description of Pain Patient Has Paino No Site Locations Pain Management and Medication Current Pain Management: Electronic Signature(s) Signed: 05/13/2019 5:30:50 PM By: Carlene Coria RN Entered By: Carlene Coria on 05/13/2019 13:07:53 -------------------------------------------------------------------------------- Patient/Caregiver Education Details Patient Name: Date of Service: [name] 3/12/2021andnbsp12:30 PM Medical Record Patient Account Number: [id number] Number: Treating RN: Kela Millin Date of Birth/Gender: Sep 03, 1980 (39 y.o. F) Other Clinician: Primary Care Physician: SYSTEM, PCP Treating Linton Ham Referring Physician: Physician/Extender: Meade Maw Weeks in Treatment: 150 Education Assessment Education Provided To: Patient Education Topics Provided Pressure: Methods: Explain/Verbal Responses: State content correctly Smoking and Wound Healing: Methods: Explain/Verbal Responses: State content correctly Wound/Skin Impairment: Methods: Explain/Verbal Responses: State content correctly Electronic Signature(s) Signed: 05/13/2019 5:31:36 PM By: Kela Millin Entered By: Kela Millin on 05/13/2019 12:55:08 -------------------------------------------------------------------------------- Wound  Assessment Details Patient Name: Date of Service: Jamie Jews A. 05/13/2019 12:30 PM Medical  Record [id number] Patient Account Number: [id number] Date of Birth/Sex: Treating RN: [birth year] (39 y.o. Clearnce Sorrel Primary Care Bernhard Koskinen: SYSTEM, PCP Other Clinician: Referring Offie Pickron: Treating Laquita Harlan/Extender:Robson, Forestine Chute, Junius Roads Weeks in Treatment: 150 Wound Status Wound Number: 1 Primary Etiology: Pressure Ulcer Wound Location: Sacrum - Medial Wound Status: Open Wounding Event: Pressure Injury Comorbid History: Anemia, Osteomyelitis Date Acquired: 05/15/2016 Weeks Of Treatment: 150 Clustered Wound: No Photos Wound Measurements Length: (cm) 1 % Reduction in Are Width: (cm) 1.5 % Reduction in Vol Depth: (cm) 2 Epithelialization: Area: (cm) 1.178 Tunneling: Volume: (cm) 2.356 Undermining: Wound Description Classification: Category/Stage IV Foul Odor After Cl Wound Margin: Epibole Slough/Fibrino Exudate Amount: Medium Exudate Type: Serosanguineous Exudate Color: red, brown Wound Bed Granulation Amount: Large (67-100%) Granulation Quality: Pink, Pale Fascia Exposed: Necrotic Amount: None Present (0%) Fat Layer (Subcuta Tendon Exposed: Muscle Exposed: Joint Exposed: Bone Exposed: eansing: No Yes Exposed Structure No neous Tissue) Exposed: Yes No No No No a: 96.4% ume: 98.4% Medium (34-66%) No No Treatment Notes Wound #1 (Medial Sacrum) 1. Cleanse With Wound Cleanser 2. Periwound Care Skin Prep 3. Primary Dressing Applied Calcium Alginate Ag 4. Secondary Dressing Foam Border Dressing 5. Secured With Office manager) Signed: 05/16/2019 5:11:29 PM By: Mikeal Hawthorne EMT/HBOT Signed: 05/16/2019 5:25:45 PM By: Kela Millin Previous Signature: 05/13/2019 5:30:50 PM Version By: Carlene Coria RN Entered By: Mikeal Hawthorne on 05/16/2019  14:49:47 -------------------------------------------------------------------------------- Vitals Details Patient Name: Date of Service: Jamie Jews A. 05/13/2019 12:30 PM Medical Record [id number] Patient Account Number: [id number] Date of Birth/Sex: Treating RN: [birth year] (39 y.o. Orvan Falconer Primary Care Teryl Gubler: SYSTEM, PCP Other Clinician: Referring Costa Jha: Treating Alyan Hartline/Extender:Robson, Forestine Chute, Junius Roads Weeks in Treatment: 150 Vital Signs Time Taken: 13:07 Temperature (F): 98.5 Height (in): 65 Pulse (bpm): 96 Weight (lbs): 201 Respiratory Rate (breaths/min): 18 Body Mass Index (BMI): 33.4 Blood Pressure (mmHg): 97/60 Reference Range: 80 - 120 mg / dl Electronic Signature(s) Signed: 05/13/2019 5:30:50 PM By: Carlene Coria RN Entered By: Carlene Coria on 05/13/2019 13:07:47

## 2019-06-11 ENCOUNTER — Emergency Department (HOSPITAL_COMMUNITY)
Admission: EM | Admit: 2019-06-11 | Discharge: 2019-06-12 | Disposition: A | Discharge status: Home / Self Care | Payer: Medicaid Other | Attending: Emergency Medicine | Admitting: Emergency Medicine

## 2019-06-11 ENCOUNTER — Encounter (HOSPITAL_COMMUNITY): Payer: Self-pay | Admitting: Emergency Medicine

## 2019-06-11 ENCOUNTER — Other Ambulatory Visit: Payer: Self-pay

## 2019-06-11 DIAGNOSIS — R300 Dysuria: Secondary | ICD-10-CM | POA: Diagnosis present

## 2019-06-11 DIAGNOSIS — B373 Candidiasis of vulva and vagina: Secondary | ICD-10-CM | POA: Diagnosis not present

## 2019-06-11 DIAGNOSIS — N3090 Cystitis, unspecified without hematuria: Secondary | ICD-10-CM | POA: Insufficient documentation

## 2019-06-11 DIAGNOSIS — E039 Hypothyroidism, unspecified: Secondary | ICD-10-CM | POA: Insufficient documentation

## 2019-06-11 DIAGNOSIS — F17210 Nicotine dependence, cigarettes, uncomplicated: Secondary | ICD-10-CM | POA: Diagnosis not present

## 2019-06-11 DIAGNOSIS — B379 Candidiasis, unspecified: Secondary | ICD-10-CM

## 2019-06-11 LAB — COMPREHENSIVE METABOLIC PANEL
ALT: 12 U/L (ref 0–44)
AST: 11 U/L — ABNORMAL LOW (ref 15–41)
Albumin: 3.4 g/dL — ABNORMAL LOW (ref 3.5–5.0)
Alkaline Phosphatase: 72 U/L (ref 38–126)
Anion gap: 9 (ref 5–15)
BUN: 5 mg/dL — ABNORMAL LOW (ref 6–20)
CO2: 27 mmol/L (ref 22–32)
Calcium: 8.2 mg/dL — ABNORMAL LOW (ref 8.9–10.3)
Chloride: 99 mmol/L (ref 98–111)
Creatinine, Ser: 0.33 mg/dL — ABNORMAL LOW (ref 0.44–1.00)
GFR calc Af Amer: >60 mL/min (ref >60)
GFR calc non Af Amer: >60 mL/min (ref >60)
Glucose, Bld: 92 mg/dL (ref 70–99)
Potassium: 3.2 mmol/L — ABNORMAL LOW (ref 3.5–5.1)
Sodium: 135 mmol/L (ref 135–145)
Total Bilirubin: 0.4 mg/dL (ref 0.3–1.2)
Total Protein: 6.6 g/dL (ref 6.5–8.1)

## 2019-06-11 LAB — CBC WITH DIFFERENTIAL/PLATELET
Abs Immature Granulocytes: 0.04 10*3/uL (ref 0.00–0.07)
Basophils Absolute: 0.1 10*3/uL (ref 0.0–0.1)
Basophils Relative: 1 %
Eosinophils Absolute: 0.3 10*3/uL (ref 0.0–0.5)
Eosinophils Relative: 3 %
HCT: 41.3 % (ref 36.0–46.0)
Hemoglobin: 12.7 g/dL (ref 12.0–15.0)
Immature Granulocytes: 0 %
Lymphocytes Relative: 27 %
Lymphs Abs: 3 10*3/uL (ref 0.7–4.0)
MCH: 27.9 pg (ref 26.0–34.0)
MCHC: 30.8 g/dL (ref 30.0–36.0)
MCV: 90.8 fL (ref 80.0–100.0)
Monocytes Absolute: 0.6 10*3/uL (ref 0.1–1.0)
Monocytes Relative: 5 %
Neutro Abs: 7.2 10*3/uL (ref 1.7–7.7)
Neutrophils Relative %: 64 %
Platelets: 575 10*3/uL — ABNORMAL HIGH (ref 150–400)
RBC: 4.55 MIL/uL (ref 3.87–5.11)
RDW: 15.3 % (ref 11.5–15.5)
WBC: 11.1 10*3/uL — ABNORMAL HIGH (ref 4.0–10.5)
nRBC: 0 % (ref 0.0–0.2)

## 2019-06-11 LAB — URINALYSIS, ROUTINE W REFLEX MICROSCOPIC
Bilirubin Urine: NEGATIVE
Glucose, UA: NEGATIVE mg/dL
Hgb urine dipstick: NEGATIVE
Ketones, ur: NEGATIVE mg/dL
Nitrite: POSITIVE — AB
Protein, ur: 100 mg/dL — AB
Specific Gravity, Urine: 1.011 (ref 1.005–1.030)
pH: 7 (ref 5.0–8.0)

## 2019-06-11 LAB — LACTIC ACID, PLASMA: Lactic Acid, Venous: 0.8 mmol/L (ref 0.5–1.9)

## 2019-06-11 MED ORDER — CIPROFLOXACIN HCL 500 MG PO TABS: 500.0000 mg | ORAL_TABLET | Freq: Two times a day (BID) | ORAL | 0 refills | Status: AC

## 2019-06-11 MED ORDER — ONDANSETRON HCL 4 MG PO TABS
4.0000 mg | ORAL_TABLET | Freq: Once | ORAL | Status: AC
  Administered 2019-06-11: 16:00:00 4 mg via ORAL
  Filled 2019-06-11: qty 1

## 2019-06-11 MED ORDER — CIPROFLOXACIN HCL 250 MG PO TABS
500.0000 mg | ORAL_TABLET | Freq: Once | ORAL | Status: AC
  Administered 2019-06-11: 500 mg via ORAL
  Filled 2019-06-11: qty 2

## 2019-06-11 MED ORDER — OXYCODONE-ACETAMINOPHEN 5-325 MG PO TABS
1.0000 | ORAL_TABLET | Freq: Once | ORAL | Status: AC
  Administered 2019-06-11: 16:00:00 1 via ORAL
  Filled 2019-06-11: qty 1

## 2019-06-11 MED ORDER — FLUCONAZOLE 100 MG PO TABS
200.0000 mg | ORAL_TABLET | Freq: Once | ORAL | Status: AC
  Administered 2019-06-11: 16:00:00 200 mg via ORAL
  Filled 2019-06-11: qty 2

## 2019-06-11 NOTE — ED Notes (Signed)
Awaiting transport.

## 2019-06-11 NOTE — ED Notes (Signed)
Dr Long in to assess 

## 2019-06-11 NOTE — ED Notes (Signed)
Called EMS to transfer Pt back home. 

## 2019-06-11 NOTE — ED Notes (Signed)
EMS called for transport home.
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
## 2019-06-11 NOTE — ED Notes (Signed)
Continues to await transport home   Several calls to C com   "busy"   Pt continues to await transport home

## 2019-06-11 NOTE — ED Triage Notes (Signed)
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

## 2019-06-11 NOTE — Discharge Instructions (Signed)
Take your antibiotics as directed. Return with any worsening pain or fever.

## 2019-06-11 NOTE — ED Notes (Signed)
Snack provided

## 2019-06-11 NOTE — ED Notes (Signed)
Pt reports she has an 71F  There is particulate in her catheter and bag She reports noone from home health will change her catheter  Catheter replaced with 71F With return of clear yellow urine   Pt noted to have a yeast infection   Sterile technique observed

## 2019-06-12 MED ORDER — OXYCODONE-ACETAMINOPHEN 5-325 MG PO TABS: 2.0000 | ORAL_TABLET | Freq: Once | ORAL | Status: DC

## 2019-06-14 LAB — URINE CULTURE

## 2019-06-17 LAB — CULTURE, BLOOD (ROUTINE X 2)
Culture: NO GROWTH
Culture: NO GROWTH
Special Requests: ADEQUATE

## 2019-06-29 ENCOUNTER — Encounter: Payer: Self-pay | Admitting: Family Medicine

## 2019-06-29 ENCOUNTER — Other Ambulatory Visit: Payer: Self-pay

## 2019-06-29 ENCOUNTER — Ambulatory Visit: Payer: Medicaid Other | Admitting: Family Medicine

## 2019-06-29 VITALS — BP 133/88 | HR 83 | Temp 98.9°F

## 2019-06-29 DIAGNOSIS — Z79899 Other long term (current) drug therapy: Secondary | ICD-10-CM

## 2019-06-29 DIAGNOSIS — F411 Generalized anxiety disorder: Secondary | ICD-10-CM | POA: Diagnosis not present

## 2019-06-29 DIAGNOSIS — K219 Gastro-esophageal reflux disease without esophagitis: Secondary | ICD-10-CM

## 2019-06-29 DIAGNOSIS — E782 Mixed hyperlipidemia: Secondary | ICD-10-CM

## 2019-06-29 DIAGNOSIS — E876 Hypokalemia: Secondary | ICD-10-CM

## 2019-06-29 DIAGNOSIS — F339 Major depressive disorder, recurrent, unspecified: Secondary | ICD-10-CM | POA: Diagnosis not present

## 2019-06-29 DIAGNOSIS — M62838 Other muscle spasm: Secondary | ICD-10-CM

## 2019-06-29 DIAGNOSIS — F410 Panic disorder [episodic paroxysmal anxiety] without agoraphobia: Secondary | ICD-10-CM | POA: Diagnosis not present

## 2019-06-29 DIAGNOSIS — R3989 Other symptoms and signs involving the genitourinary system: Secondary | ICD-10-CM

## 2019-06-29 DIAGNOSIS — L98429 Non-pressure chronic ulcer of back with unspecified severity: Secondary | ICD-10-CM

## 2019-06-29 DIAGNOSIS — N312 Flaccid neuropathic bladder, not elsewhere classified: Secondary | ICD-10-CM

## 2019-06-29 DIAGNOSIS — Z960 Presence of urogenital implants: Secondary | ICD-10-CM

## 2019-06-29 DIAGNOSIS — G894 Chronic pain syndrome: Secondary | ICD-10-CM

## 2019-06-29 DIAGNOSIS — E038 Other specified hypothyroidism: Secondary | ICD-10-CM

## 2019-06-29 DIAGNOSIS — Z87440 Personal history of urinary (tract) infections: Secondary | ICD-10-CM

## 2019-06-29 MED ORDER — OMEPRAZOLE 20 MG PO CPDR: 20.0000 mg | DELAYED_RELEASE_CAPSULE | Freq: Every day | ORAL | 1 refills | Status: DC

## 2019-06-29 MED ORDER — VITAMIN C 500 MG PO CAPS: 1.0000 | ORAL_CAPSULE | Freq: Every day | ORAL | 1 refills | Status: DC

## 2019-06-29 MED ORDER — TIZANIDINE HCL 4 MG PO TABS: 4.0000 mg | ORAL_TABLET | Freq: Four times a day (QID) | ORAL | 2 refills | Status: DC | PRN

## 2019-06-29 MED ORDER — VITAMIN B-12 1000 MCG PO TABS: 1000.0000 ug | ORAL_TABLET | Freq: Every day | ORAL | 1 refills | Status: DC

## 2019-06-29 MED ORDER — LEVOTHYROXINE SODIUM 88 MCG PO TABS: 88.0000 ug | ORAL_TABLET | Freq: Every day | ORAL | 1 refills | Status: DC

## 2019-06-29 MED ORDER — GABAPENTIN 600 MG PO TABS: 600.0000 mg | ORAL_TABLET | Freq: Four times a day (QID) | ORAL | 1 refills | Status: DC

## 2019-06-29 MED ORDER — VITAMIN D3 50 MCG (2000 UT) PO TABS: 2000.0000 [IU] | ORAL_TABLET | Freq: Every day | ORAL | 1 refills | Status: DC

## 2019-06-29 MED ORDER — ALPRAZOLAM 0.25 MG PO TABS: 0.2500 mg | ORAL_TABLET | Freq: Two times a day (BID) | ORAL | 1 refills | Status: DC | PRN

## 2019-06-29 MED ORDER — FERROUS SULFATE 325 (65 FE) MG PO TABS: 325.0000 mg | ORAL_TABLET | Freq: Every day | ORAL | 1 refills | Status: DC

## 2019-06-29 MED ORDER — CITALOPRAM HYDROBROMIDE 40 MG PO TABS: 40.0000 mg | ORAL_TABLET | Freq: Every day | ORAL | 1 refills | Status: DC
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
## 2019-07-04 ENCOUNTER — Encounter: Payer: Self-pay | Admitting: Family Medicine

## 2019-07-04 DIAGNOSIS — Z79899 Other long term (current) drug therapy: Secondary | ICD-10-CM | POA: Insufficient documentation

## 2019-07-04 DIAGNOSIS — F411 Generalized anxiety disorder: Secondary | ICD-10-CM | POA: Insufficient documentation

## 2019-07-04 DIAGNOSIS — N319 Neuromuscular dysfunction of bladder, unspecified: Secondary | ICD-10-CM | POA: Insufficient documentation

## 2019-07-04 DIAGNOSIS — E782 Mixed hyperlipidemia: Secondary | ICD-10-CM | POA: Insufficient documentation

## 2019-07-04 DIAGNOSIS — F410 Panic disorder [episodic paroxysmal anxiety] without agoraphobia: Secondary | ICD-10-CM | POA: Insufficient documentation

## 2019-07-04 DIAGNOSIS — Z9359 Other cystostomy status: Secondary | ICD-10-CM | POA: Insufficient documentation

## 2019-07-04 DIAGNOSIS — Z960 Presence of urogenital implants: Secondary | ICD-10-CM | POA: Insufficient documentation

## 2019-07-04 DIAGNOSIS — Z720 Tobacco use: Secondary | ICD-10-CM | POA: Insufficient documentation

## 2019-07-06 ENCOUNTER — Telehealth: Payer: Self-pay | Admitting: Family Medicine

## 2019-07-06 NOTE — Addendum Note (Signed)
Addended by: Loman Brooklyn on: 07/06/2019 05:02 PM   Modules accepted: Orders

## 2019-07-06 NOTE — Telephone Encounter (Signed)
Please call patient and let her know I have her GeneSight test results. I put a copy in the mail to her. Celexa is not actually a bad medication for her, but is maxed out. Is she agreeable in trying Effexor? This will also help some with pain.

## 2019-07-07 LAB — CBC WITH DIFFERENTIAL/PLATELET
Basophils Absolute: 0.1 10*3/uL (ref 0.0–0.2)
Basos: 1 %
EOS (ABSOLUTE): 0.4 10*3/uL (ref 0.0–0.4)
Eos: 2 %
Hematocrit: 40.9 % (ref 34.0–46.6)
Hemoglobin: 14 g/dL (ref 11.1–15.9)
Immature Grans (Abs): 0 10*3/uL (ref 0.0–0.1)
Immature Granulocytes: 0 %
Lymphocytes Absolute: 3.3 10*3/uL — ABNORMAL HIGH (ref 0.7–3.1)
Lymphs: 22 %
MCH: 29.2 pg (ref 26.6–33.0)
MCHC: 34.2 g/dL (ref 31.5–35.7)
MCV: 85 fL (ref 79–97)
Monocytes Absolute: 0.7 10*3/uL (ref 0.1–0.9)
Monocytes: 5 %
Neutrophils Absolute: 10.5 10*3/uL — ABNORMAL HIGH (ref 1.4–7.0)
Neutrophils: 70 %
Platelets: 538 10*3/uL — ABNORMAL HIGH (ref 150–450)
RBC: 4.79 x10E6/uL (ref 3.77–5.28)
RDW: 14.1 % (ref 11.7–15.4)
WBC: 15 10*3/uL — ABNORMAL HIGH (ref 3.4–10.8)

## 2019-07-07 LAB — THC,MS,WB/SP RFX
Cannabidiol: NEGATIVE ng/mL
Cannabinoid Confirmation: POSITIVE
Cannabinol: NEGATIVE ng/mL
Carboxy-THC: 5.5 ng/mL
Hydroxy-THC: NEGATIVE ng/mL
Tetrahydrocannabinol(THC): NEGATIVE ng/mL

## 2019-07-07 LAB — CMP14+EGFR
ALT: 7 IU/L (ref 0–32)
AST: 8 IU/L (ref 0–40)
Albumin/Globulin Ratio: 1.5 (ref 1.2–2.2)
Albumin: 4.1 g/dL (ref 3.8–4.8)
Alkaline Phosphatase: 96 IU/L (ref 39–117)
BUN/Creatinine Ratio: 12 (ref 9–23)
BUN: 6 mg/dL (ref 6–20)
Bilirubin Total: 0.3 mg/dL (ref 0.0–1.2)
CO2: 25 mmol/L (ref 20–29)
Calcium: 9.2 mg/dL (ref 8.7–10.2)
Chloride: 99 mmol/L (ref 96–106)
Creatinine, Ser: 0.52 mg/dL — ABNORMAL LOW (ref 0.57–1.00)
GFR calc Af Amer: 141 mL/min/{1.73_m2} (ref >59)
GFR calc non Af Amer: 122 mL/min/{1.73_m2} (ref >59)
Globulin, Total: 2.8 g/dL (ref 1.5–4.5)
Glucose: 102 mg/dL — ABNORMAL HIGH (ref 65–99)
Potassium: 4.1 mmol/L (ref 3.5–5.2)
Sodium: 140 mmol/L (ref 134–144)
Total Protein: 6.9 g/dL (ref 6.0–8.5)

## 2019-07-07 LAB — DRUG SCREEN 10 W/CONF, SERUM
Amphetamines, IA: NEGATIVE ng/mL
Barbiturates, IA: NEGATIVE ug/mL
Benzodiazepines, IA: NEGATIVE ng/mL
Cocaine & Metabolite, IA: NEGATIVE ng/mL
Methadone, IA: NEGATIVE ng/mL
Opiates, IA: NEGATIVE ng/mL
Oxycodones, IA: POSITIVE ng/mL — AB
Phencyclidine, IA: NEGATIVE ng/mL
Propoxyphene, IA: NEGATIVE ng/mL
THC(Marijuana) Metabolite, IA: POSITIVE ng/mL — AB

## 2019-07-07 LAB — OXYCODONES,MS,WB/SP RFX
Oxycocone: 54.6 ng/mL
Oxycodones Confirmation: POSITIVE
Oxymorphone: NEGATIVE ng/mL

## 2019-07-07 LAB — LIPID PANEL
Chol/HDL Ratio: 6.8 ratio — ABNORMAL HIGH (ref 0.0–4.4)
Cholesterol, Total: 211 mg/dL — ABNORMAL HIGH (ref 100–199)
HDL: 31 mg/dL — ABNORMAL LOW (ref >39)
LDL Chol Calc (NIH): 160 mg/dL — ABNORMAL HIGH (ref 0–99)
Triglycerides: 107 mg/dL (ref 0–149)
VLDL Cholesterol Cal: 20 mg/dL (ref 5–40)

## 2019-07-07 LAB — TSH: TSH: 2.31 u[IU]/mL (ref 0.450–4.500)

## 2019-07-07 NOTE — Telephone Encounter (Signed)
Left message to please call our office. 

## 2019-07-11 ENCOUNTER — Encounter (HOSPITAL_BASED_OUTPATIENT_CLINIC_OR_DEPARTMENT_OTHER): Payer: No Typology Code available for payment source | Admitting: Internal Medicine

## 2019-07-13 ENCOUNTER — Telehealth: Payer: Self-pay | Admitting: Family Medicine

## 2019-07-14 ENCOUNTER — Other Ambulatory Visit: Payer: Self-pay

## 2019-07-14 ENCOUNTER — Emergency Department (HOSPITAL_COMMUNITY)
Admission: EM | Admit: 2019-07-14 | Discharge: 2019-07-14 | Disposition: A | Discharge status: Home / Self Care | Payer: Medicaid Other | Attending: Emergency Medicine | Admitting: Emergency Medicine

## 2019-07-14 ENCOUNTER — Encounter (HOSPITAL_COMMUNITY): Payer: Self-pay | Admitting: Emergency Medicine

## 2019-07-14 DIAGNOSIS — Y69 Unspecified misadventure during surgical and medical care: Secondary | ICD-10-CM | POA: Insufficient documentation

## 2019-07-14 DIAGNOSIS — T83091A Other mechanical complication of indwelling urethral catheter, initial encounter: Secondary | ICD-10-CM | POA: Diagnosis present

## 2019-07-14 DIAGNOSIS — G8220 Paraplegia, unspecified: Secondary | ICD-10-CM | POA: Insufficient documentation

## 2019-07-14 DIAGNOSIS — T839XXA Unspecified complication of genitourinary prosthetic device, implant and graft, initial encounter: Secondary | ICD-10-CM

## 2019-07-14 MED ORDER — OXYCODONE HCL 5 MG PO TABS
10.0000 mg | ORAL_TABLET | Freq: Once | ORAL | Status: AC
  Administered 2019-07-14: 10 mg via ORAL
  Filled 2019-07-14: qty 2

## 2019-07-14 NOTE — Telephone Encounter (Signed)
Multiple attempts made to contact patient.  This encounter will now be closed  
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
## 2019-07-14 NOTE — ED Triage Notes (Signed)
Pt arrives via [facility] with C/O "foley coming out and my lower back hurts." Pt stating she has chronic foley.

## 2019-07-14 NOTE — ED Notes (Signed)
  Called EMS to transport patient home.

## 2019-07-14 NOTE — Telephone Encounter (Signed)
Can we call and speak to social worker at hospital regarding her concerns? Also, request the records from them.

## 2019-07-15 ENCOUNTER — Encounter (HOSPITAL_BASED_OUTPATIENT_CLINIC_OR_DEPARTMENT_OTHER): Payer: No Typology Code available for payment source | Admitting: Internal Medicine

## 2019-07-18 ENCOUNTER — Emergency Department (HOSPITAL_COMMUNITY)
Admission: EM | Admit: 2019-07-18 | Discharge: 2019-07-18 | Disposition: A | Discharge status: Home / Self Care | Payer: Medicaid Other | Attending: Emergency Medicine | Admitting: Emergency Medicine

## 2019-07-18 ENCOUNTER — Encounter (HOSPITAL_BASED_OUTPATIENT_CLINIC_OR_DEPARTMENT_OTHER): Payer: Medicaid Other | Attending: Internal Medicine | Admitting: Internal Medicine

## 2019-07-18 ENCOUNTER — Encounter (HOSPITAL_COMMUNITY): Payer: Self-pay | Admitting: Emergency Medicine

## 2019-07-18 ENCOUNTER — Other Ambulatory Visit: Payer: Self-pay

## 2019-07-18 DIAGNOSIS — I10 Essential (primary) hypertension: Secondary | ICD-10-CM | POA: Insufficient documentation

## 2019-07-18 DIAGNOSIS — L89154 Pressure ulcer of sacral region, stage 4: Secondary | ICD-10-CM | POA: Insufficient documentation

## 2019-07-18 DIAGNOSIS — E039 Hypothyroidism, unspecified: Secondary | ICD-10-CM | POA: Diagnosis not present

## 2019-07-18 DIAGNOSIS — F17210 Nicotine dependence, cigarettes, uncomplicated: Secondary | ICD-10-CM | POA: Diagnosis not present

## 2019-07-18 DIAGNOSIS — L89613 Pressure ulcer of right heel, stage 3: Secondary | ICD-10-CM | POA: Diagnosis not present

## 2019-07-18 DIAGNOSIS — L97811 Non-pressure chronic ulcer of other part of right lower leg limited to breakdown of skin: Secondary | ICD-10-CM | POA: Diagnosis not present

## 2019-07-18 DIAGNOSIS — T839XXA Unspecified complication of genitourinary prosthetic device, implant and graft, initial encounter: Secondary | ICD-10-CM | POA: Diagnosis not present

## 2019-07-18 DIAGNOSIS — Z79899 Other long term (current) drug therapy: Secondary | ICD-10-CM | POA: Diagnosis not present

## 2019-07-18 DIAGNOSIS — Z9049 Acquired absence of other specified parts of digestive tract: Secondary | ICD-10-CM | POA: Insufficient documentation

## 2019-07-18 DIAGNOSIS — G8222 Paraplegia, incomplete: Secondary | ICD-10-CM | POA: Insufficient documentation

## 2019-07-18 DIAGNOSIS — T83028A Displacement of other indwelling urethral catheter, initial encounter: Secondary | ICD-10-CM | POA: Diagnosis present

## 2019-07-18 LAB — URINALYSIS, ROUTINE W REFLEX MICROSCOPIC
Bilirubin Urine: NEGATIVE
Glucose, UA: NEGATIVE mg/dL
Ketones, ur: NEGATIVE mg/dL
Nitrite: NEGATIVE
Protein, ur: 100 mg/dL — AB
RBC / HPF: >50 RBC/hpf — ABNORMAL HIGH (ref 0–5)
Specific Gravity, Urine: 1.009 (ref 1.005–1.030)
pH: 8 (ref 5.0–8.0)

## 2019-07-18 MED ORDER — OXYCODONE HCL 5 MG PO TABS
10.0000 mg | ORAL_TABLET | Freq: Once | ORAL | Status: AC
  Administered 2019-07-18: 10 mg via ORAL
  Filled 2019-07-18: qty 2

## 2019-07-18 NOTE — ED Triage Notes (Signed)
Patient brought in by [facility] for foley catheter issues. Patient states that her foley catheter balloon popped. Patient just had foley placement on 07/14/19. Patient also states that she is having back pain.
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
## 2019-07-18 NOTE — ED Notes (Signed)
Patients wound dressing changed 

## 2019-07-19 ENCOUNTER — Telehealth: Payer: Self-pay | Admitting: Family Medicine

## 2019-07-19 LAB — URINE CULTURE: Culture: NO GROWTH

## 2019-07-19 NOTE — Telephone Encounter (Signed)
[name],  This patient did have [facility] but now does not and they are not accepting any pts at this time.  She currently has a large wound on her sacrum and a catheter in place. Mom states that she needs a nurse to come out to the home.  Do you have any forms for pt to establish with another home health agency? If not, please get info for the mother. Thank you.

## 2019-07-20 NOTE — Telephone Encounter (Signed)
Pt aware that I waiting on response from [facility]. I have tried 3 other agencies that were not able to take her d/t staffing or insurance

## 2019-07-21 NOTE — Progress Notes (Signed)
[name], [name] ([id number]) Visit Report for 07/18/2019 Arrival Information Details Patient Name: Date of Service: [name], [name] 07/18/2019 1:15 PM Medical Record Number: [id number] Patient Account Number: [id number] Date of Birth/Sex: Treating RN: 1980/06/03 (39 y.o. Female) [place], [place] Primary Care Jerico Grisso: Jori Moll, Karsten Fells Other Clinician: Referring Vanessa Kampf: Treating Murle Otting/Extender: Yevonne Aline, [name] Weeks in Treatment: 92 Visit Information History Since Last Visit All ordered tests and consults were completed: No Patient Arrived: Wheel Chair Added or deleted any medications: No Arrival Time: 13:39 Any new allergies or adverse reactions: No Accompanied By: mother Had a fall or experienced change in No Transfer Assistance: None activities of daily living that may affect Patient Identification Verified: Yes risk of falls: Secondary Verification Process Completed: Yes Signs or symptoms of abuse/neglect since last visito No Patient Requires Transmission-Based Precautions: No Hospitalized since last visit: No Patient Has Alerts: No Implantable device outside of the clinic excluding No cellular tissue based products placed in the center since last visit: Has Dressing in Place as Prescribed: Yes Pain Present Now: No Electronic Signature(s) Signed: 07/19/2019 5:13:38 PM By: Carlene Coria RN Entered By: Carlene Coria on 07/18/2019 13:40:26 -------------------------------------------------------------------------------- Encounter Discharge Information Details Patient Name: Date of Service: Jamie Boyden A. 07/18/2019 1:15 PM Medical Record Number: [id number] Patient Account Number: [id number] Date of Birth/Sex: Treating RN: May 21, 1980 (39 y.o. Female) Levan Hurst Primary Care Nechelle Petrizzo: Jori Moll, Karsten Fells Other Clinician: Referring Lina Hitch: Treating Saryna Kneeland/Extender: Yevonne Aline, [name] Weeks in Treatment: 159 Encounter Discharge  Information Items Post Procedure Vitals Discharge Condition: Stable Temperature (F): 98.5 Ambulatory Status: Wheelchair Pulse (bpm): 84 Discharge Destination: Home Respiratory Rate (breaths/min): 18 Transportation: Private Auto Blood Pressure (mmHg): 136/78 Accompanied By: mother Schedule Follow-up Appointment: Yes Clinical Summary of Care: Electronic Signature(s) Signed: 07/18/2019 5:02:28 PM By: Deon Pilling Entered By: Deon Pilling on 07/18/2019 15:03:46 -------------------------------------------------------------------------------- Multi Wound Chart Details Patient Name: Date of Service: Jamie Boyden A. 07/18/2019 1:15 PM Medical Record Number: [id number] Patient Account Number: [id number] Date of Birth/Sex: Treating RN: [birth year] (39 y.o. Female) Levan Hurst Primary Care Elson Ulbrich: Jori Moll, Karsten Fells Other Clinician: Referring Ivar Domangue: Treating Shelisa Fern/Extender: Yevonne Aline, [name] Weeks in Treatment: 159 Vital Signs Height(in): 65 Pulse(bpm): 86 Weight(lbs): 57 Blood Pressure(mmHg): 136/78 Body Mass Index(BMI): 33 Temperature(F): 98.5 Respiratory Rate(breaths/min): 18 Photos: [1:No Photos Medial Sacrum] [14:No Photos Left Calcaneus] [N/A:N/A N/A] Wound Location: [1:Pressure Injury] [14:Pressure Injury] [N/A:N/A] Wounding Event: [1:Pressure Ulcer] [14:Pressure Ulcer] [N/A:N/A] Primary Etiology: [1:Anemia, Osteomyelitis] [14:Anemia, Osteomyelitis] [N/A:N/A] Comorbid History: [1:05/15/2016] [14:07/18/2019] [N/A:N/A] Date Acquired: [1:159] [14:9] [N/A:N/A] Weeks of Treatment: [1:Open] [14:Open] [N/A:N/A] Wound Status: [1:5.5x2x5.5] [14:3x2.5x0.2] [N/A:N/A] Measurements L x W x D (cm) [1:8.639] [14:5.89] [N/A:N/A] A (cm) : rea [1:47.517] [14:1.178] [N/A:N/A] Volume (cm) : [1:73.70%] [14:0.00%] [N/A:N/A] % Reduction in A rea: [1:67.20%] [14:0.00%] [N/A:N/A] % Reduction in Volume: [1:Category/Stage IV] [14:Category/Stage III]  [N/A:N/A] Classification: [1:Medium] [14:Medium] [N/A:N/A] Exudate A mount: [1:Serosanguineous] [14:Serosanguineous] [N/A:N/A] Exudate Type: [1:red, brown] [14:red, brown] [N/A:N/A] Exudate Color: [1:Epibole] [14:Flat and Intact] [N/A:N/A] Wound Margin: [1:Large (67-100%)] [14:Medium (34-66%)] [N/A:N/A] Granulation A mount: [1:Pink, Pale] [14:Pink] [N/A:N/A] Granulation Quality: [1:None Present (0%)] [14:Medium (34-66%)] [N/A:N/A] Necrotic A mount: [1:N/A] [14:Eschar, Adherent Slough] [N/A:N/A] Necrotic Tissue: [1:Fat Layer (Subcutaneous Tissue)] [14:Fat Layer (Subcutaneous Tissue)] [N/A:N/A] Exposed Structures: [1:Exposed: Yes Fascia: No Tendon: No Muscle: No Joint: No Bone: No Medium (34-66%)] [14:Exposed: Yes Fascia: No Tendon: No Muscle: No Joint: No Bone: No None] [N/A:N/A] Epithelialization: [1:Debridement - Excisional] [14:Debridement - Excisional] [N/A:N/A] Debridement: Pre-procedure Verification/Time Out 14:20 [14:14:20] [N/A:N/A] Taken: [  1:Subcutaneous, Slough] [14:Necrotic/Eschar, Subcutaneous,] [N/A:N/A] Tissue Debrided: [1:Skin/Subcutaneous Tissue] [14:Slough Skin/Subcutaneous Tissue] [N/A:N/A] Level: [1:11] [14:7.5] [N/A:N/A] Debridement A (sq cm): [1:rea Curette] [14:Curette] [N/A:N/A] Instrument: [1:Minimum] [14:Minimum] [N/A:N/A] Bleeding: [1:Pressure] [14:Pressure] [N/A:N/A] Hemostasis Achieved: [1:0] [14:0] [N/A:N/A] Procedural Pain: [1:0] [14:0] [N/A:N/A] Post Procedural Pain: [1:Procedure was tolerated well] [14:Procedure was tolerated well] [N/A:N/A] Debridement Treatment Response: [1:5.5x2x5.5] [14:3x2.5x0.2] [N/A:N/A] Post Debridement Measurements L x W x D (cm) [1:47.517] [14:1.178] [N/A:N/A] Post Debridement Volume: (cm) [1:Category/Stage IV] [14:Category/Stage III] [N/A:N/A] Post Debridement Stage: [1:Debridement] [14:Debridement] [N/A:N/A] Treatment Notes Electronic Signature(s) Signed: 07/18/2019 5:54:52 PM By: Levan Hurst RN, BSN Signed: 07/21/2019  12:52:55 PM By: Linton Ham MD Entered By: Linton Ham on 07/18/2019 14:38:08 -------------------------------------------------------------------------------- Multi-Disciplinary Care Plan Details Patient Name: Date of Service: SURAH, PELLEY A. 07/18/2019 1:15 PM Medical Record Number: [id number] Patient Account Number: [id number] Date of Birth/Sex: Treating RN: 1980-11-08 (39 y.o. Female) Levan Hurst Primary Care Luanna Weesner: Jori Moll, Karsten Fells Other Clinician: Referring Derian Pfost: Treating Dewaine Morocho/Extender: Yevonne Aline, [name] Weeks in Treatment: 159 Active Inactive Pressure Nursing Diagnoses: Knowledge deficit related to management of pressures ulcers Potential for impaired tissue integrity related to pressure, friction, moisture, and shear Goals: Patient will remain free from development of additional pressure ulcers Date Initiated: 06/27/2016 Date Inactivated: 01/17/2019 Target Resolution Date: 12/24/2018 Goal Status: Met Patient/caregiver will verbalize risk factors for pressure ulcer development Date Initiated: 06/27/2016 Date Inactivated: 07/08/2017 Target Resolution Date: 07/08/2017 Goal Status: Met Patient/caregiver will verbalize understanding of pressure ulcer management Date Initiated: 06/27/2016 Target Resolution Date: 08/19/2019 Goal Status: Active Interventions: Assess: immobility, friction, shearing, incontinence upon admission and as needed Assess offloading mechanisms upon admission and as needed Assess potential for pressure ulcer upon admission and as needed Provide education on pressure ulcers Treatment Activities: Patient referred for pressure reduction/relief devices : 06/27/2016 Pressure reduction/relief device ordered : 06/27/2016 Notes: Wound/Skin Impairment Nursing Diagnoses: Impaired tissue integrity Knowledge deficit related to smoking impact on wound healing Knowledge deficit related to ulceration/compromised skin  integrity Goals: Patient will demonstrate a reduced rate of smoking or cessation of smoking Date Initiated: 06/27/2016 Date Inactivated: 01/26/2017 Target Resolution Date: 01/08/2017 Goal Status: Unmet Unmet Reason: pt continues to smoke Patient/caregiver will verbalize understanding of skin care regimen Date Initiated: 06/27/2016 Target Resolution Date: 06/17/2019 Goal Status: Active Ulcer/skin breakdown will have a volume reduction of 50% by week 8 Date Initiated: 06/27/2016 Date Inactivated: 12/11/2016 Target Resolution Date: 07/25/2016 Goal Status: Met Interventions: Assess patient/caregiver ability to obtain necessary supplies Assess patient/caregiver ability to perform ulcer/skin care regimen upon admission and as needed Assess ulceration(s) every visit Provide education on smoking Provide education on ulcer and skin care Treatment Activities: Skin care regimen initiated : 06/27/2016 Topical wound management initiated : 06/27/2016 Notes: Electronic Signature(s) Signed: 07/18/2019 5:54:52 PM By: Levan Hurst RN, BSN Entered By: Levan Hurst on 07/18/2019 14:12:55 -------------------------------------------------------------------------------- Pain Assessment Details Patient Name: Date of Service: Jamie Boyden A. 07/18/2019 1:15 PM Medical Record Number: [id number] Patient Account Number: [id number] Date of Birth/Sex: Treating RN: [birth year] (39 y.o. Female) Carlene Coria Primary Care Sava Proby: Jori Moll, Karsten Fells Other Clinician: Referring Jericho Cieslik: Treating Coraima Tibbs/Extender: Yevonne Aline, [name] Weeks in Treatment: 159 Active Problems Location of Pain Severity and Description of Pain Patient Has Paino No Site Locations Pain Management and Medication Current Pain Management: Electronic Signature(s) Signed: 07/19/2019 5:13:38 PM By: Carlene Coria RN Entered By: Carlene Coria on 07/18/2019  13:41:12 -------------------------------------------------------------------------------- Patient/Caregiver Education Details Patient Name: Date of Service: [name] 5/17/2021andnbsp1:15 PM Medical Record Number: [id number] Patient Account  Number: [id number] Date of Birth/Gender: Treating RN: September 27, 1980 (39 y.o. Female) Levan Hurst Primary Care Physician: Jori Moll, Karsten Fells Other Clinician: Referring Physician: Treating Physician/Extender: Vella Kohler in Treatment: 159 Education Assessment Education Provided To: Patient Education Topics Provided Smoking and Wound Healing: Methods: Explain/Verbal Responses: State content correctly Wound/Skin Impairment: Methods: Explain/Verbal Responses: State content correctly Electronic Signature(s) Signed: 07/18/2019 5:54:52 PM By: Levan Hurst RN, BSN Entered By: Levan Hurst on 07/18/2019 14:13:16 -------------------------------------------------------------------------------- Wound Assessment Details Patient Name: Date of Service: Jamie Boyden A. 07/18/2019 1:15 PM Medical Record Number: [id number] Patient Account Number: [id number] Date of Birth/Sex: Treating RN: 1980/07/07 (39 y.o. Female) Epps, [place] Primary Care Dea Bitting: Jori Moll, Karsten Fells Other Clinician: Referring Altheria Shadoan: Treating Ronella Plunk/Extender: Yevonne Aline, [name] Weeks in Treatment: 159 Wound Status Wound Number: 1 Primary Etiology: Pressure Ulcer Wound Location: Medial Sacrum Wound Status: Open Wounding Event: Pressure Injury Comorbid History: Anemia, Osteomyelitis Date Acquired: 05/15/2016 Weeks Of Treatment: 159 Clustered Wound: No Wound Measurements Length: (cm) 5.5 Width: (cm) 2 Depth: (cm) 5.5 Area: (cm) 8.639 Volume: (cm) 47.517 % Reduction in Area: 73.7% % Reduction in Volume: 67.2% Epithelialization: Medium (34-66%) Tunneling: No Undermining: No Wound Description Classification:  Category/Stage IV Wound Margin: Epibole Exudate Amount: Medium Exudate Type: Serosanguineous Exudate Color: red, brown Foul Odor After Cleansing: No Slough/Fibrino Yes Wound Bed Granulation Amount: Large (67-100%) Exposed Structure Granulation Quality: Pink, Pale Fascia Exposed: No Necrotic Amount: None Present (0%) Fat Layer (Subcutaneous Tissue) Exposed: Yes Tendon Exposed: No Muscle Exposed: No Joint Exposed: No Bone Exposed: No Treatment Notes Wound #1 (Medial Sacrum) 1. Cleanse With Wound Cleanser 2. Periwound Care Skin Prep 3. Primary Dressing Applied Collegen AG Hydrogel or K-Y Jelly Other primary dressing (specifiy in notes) 4. Secondary Dressing Dry Gauze Foam Border Dressing 5. Secured With Self Adhesive Bandage Notes hydrogel moisten gauze backing the silver collagen. Electronic Signature(s) Signed: 07/19/2019 5:13:38 PM By: Carlene Coria RN Entered By: Carlene Coria on 07/18/2019 13:46:46 -------------------------------------------------------------------------------- Wound Assessment Details Patient Name: Date of Service: [name], [name] 07/18/2019 1:15 PM Medical Record Number: [id number] Patient Account Number: [id number] Date of Birth/Sex: Treating RN: 04/15/1980 (39 y.o. Female) Levan Hurst Primary Care Tou Hayner: Jori Moll, Karsten Fells Other Clinician: Referring Gayleen Sholtz: Treating Mar Walmer/Extender: Yevonne Aline, [name] Weeks in Treatment: 159 Wound Status Wound Number: 14 Primary Etiology: Pressure Ulcer Wound Location: Left Calcaneus Wound Status: Open Wounding Event: Pressure Injury Comorbid History: Anemia, Osteomyelitis Date Acquired: 07/18/2019 Weeks Of Treatment: 0 Clustered Wound: No Photos Photo Uploaded By: Mikeal Hawthorne on 07/19/2019 13:27:36 Wound Measurements Length: (cm) 3 Width: (cm) 2.5 Depth: (cm) 0.2 Area: (cm) 5.89 Volume: (cm) 1.178 % Reduction in Area: % Reduction in Volume: Epithelialization:  None Tunneling: No Undermining: No Wound Description Classification: Category/Stage III Wound Margin: Flat and Intact Exudate Amount: Medium Exudate Type: Serosanguineous Exudate Color: red, brown Wound Bed Granulation Amount: Medium (34-66%) Granulation Quality: Pink Necrotic Amount: Medium (34-66%) Necrotic Quality: Eschar, Adherent Slough Foul Odor After Cleansing: No Slough/Fibrino Yes Exposed Structure Fascia Exposed: No Fat Layer (Subcutaneous Tissue) Exposed: Yes Tendon Exposed: No Muscle Exposed: No Joint Exposed: No Bone Exposed: No Treatment Notes Wound #14 (Left Calcaneus) 1. Cleanse With Wound Cleanser 3. Primary Dressing Applied Calcium Alginate Ag 4. Secondary Dressing Dry Gauze Roll Gauze Heel Cup 5. Secured With [employer]) Signed: 07/18/2019 5:54:52 PM By: Levan Hurst RN, BSN Entered By: Levan Hurst on 07/18/2019 14:41:29 -------------------------------------------------------------------------------- [place] Details Patient Name: Date of Service: Jamie Boyden A. 07/18/2019  1:15 PM Medical Record Number: [id number] Patient Account Number: [id number] Date of Birth/Sex: Treating RN: 10-Dec-1980 (39 y.o. Female) Epps, [place] Primary Care Daxon Kyne: Jori Moll, Karsten Fells Other Clinician: Referring Andreah Goheen: Treating Dhiya Smits/Extender: Yevonne Aline, [name] Weeks in Treatment: 159 Vital Signs Time Taken: 13:40 Temperature (F): 98.5 Height (in): 65 Pulse (bpm): 84 Weight (lbs): 201 Respiratory Rate (breaths/min): 18 Body Mass Index (BMI): 33.4 Blood Pressure (mmHg): 136/78 Reference Range: 80 - 120 mg / dl Electronic Signature(s) Signed: 07/19/2019 5:13:38 PM By: Carlene Coria RN Entered By: Carlene Coria on 07/18/2019 13:41:03

## 2019-07-21 NOTE — Progress Notes (Signed)
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 have been using silver alginate to the wound. Everything else is healed up except for the probing wound on the sacrum 11/16; patient is here after an extended hiatus again. She has the small probing wound on her sacrum which we have been using silver alginate . Since she was last here she was apparently had placement of [name] baclofen pump. Apparently the surgical site at the lower left lumbar area became infected she ended up in hospital from 11/6 through 11/7 with wound dehiscence and probable infection. Her surgeon Dr. Christella Noa open the wound debrided it took cultures and placed the wound VAC. She is now receiving IV antibiotics at the direction of infectious disease which I think is ertapenem for 20 days. 03/09/2019 on evaluation today patient appears to be doing quite well with regard to her wound. Its been since 01/17/2019 when we last saw her. She subsequently ended up in the hospital due to [name] baclofen pump which became infected. She has been on antibiotics as prescribed by infectious disease for this and she was seeing Dr. Linus Salmons at [facility] for infectious disease. Subsequently she has been on doxycycline through November 29. The baclofen pump was placed on  12/22/2018 by Dr. Lovenia Shuck. Unfortunately the patient developed [name] wound infection and underwent debridement by Dr. Christella Noa on 01/08/2019. The growth in the culture revealed ESBL E. coli and diphtheroids and the patient was initially placed on ertapenem him in doxycycline for 3 weeks. Subsequently she comes in today for reevaluation and it does appear that her wound is actually doing significantly better even compared to last time we saw her. This is in regard to the medial sacral region. 2/5; I have not seen this wound in almost 3 months. Certainly has gotten better in terms of surface area although there is significant direct depth. She has completed antibiotics for the underlying osteomyelitis. 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 She has been using silver alginate. Her mother is changing the dressing. She claims to be offloading this area except for when she gets up to go to doctors appointments 3/12; this is [name] punched-out wound on the lower sacrum. Has [name] depth of about 1.8 cm. It does not appear to undermine. There is no palpable bone. We have been using silver alginate packing her mother is changing the dressing she claims to be offloading this. She had [name] baclofen pump placed to alleviate her spasticity 5/17; the patient has not been seen in over 2 months. 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 Apparently they started to notice an odor and drainage earlier this month. Patient was admitted to [facility] from 5/3 through 07/12/2019. We do not have any records here. Apparently she was found to have pneumonia, [name] UTI. She also went underwent [name] surgical debridement of her lower sacral wound. She comes in today with [name] really large postoperative wound. This does not have exposed bone but very close. This is right back to where this was [name] year or 2 ago. They have been using wet-to-dry.  She is on  an IV antibiotic but is not sure what drugs. We are not sure what home health company they are currently using. We are trying to get records from [place]. Objective Constitutional Patient is hypertensive.. Pulse regular and within target range for patient.[name] Respirations regular, non-labored and within target range.. Temperature is normal and within the target range for the patient.[name] Appears in no distress. Vitals Time Taken: 1:40 PM, Height: 65 in, Weight: 201 lbs, BMI: 33.4, Temperature: 98.5 F, Pulse: 84 bpm, Respiratory Rate: 18 breaths/min, Blood Pressure: 136/78 mmHg. General Notes: Wound exam; this was [name] small probing area when I last saw this. Indeed pictures her mother showed me today from before she was hospitalized showed [name] very small orifice but apparently it started to drain and have an odor. She will underwent [name] very aggressive surgical debridement at [facility] now with [name] very large open area including [name] large area of wound bed over the sacrum. Debrided with [name] #5 curette. ooThey also showed me an area on her left heel thick hyperkeratotic horn. I remove this with [name] #10 scalpel and then used [name] #5 curette to remove necrotic tissue from the surface of [name] stage III presumably pressure area. No evidence of surrounding infection Integumentary (Hair, Skin) Wound #1 status is Open. Original cause of wound was Pressure Injury. The wound is located on the Medial Sacrum. The wound measures 5.5cm length x 2cm width x 5.5cm depth; 8.639cm^2 area and 47.517cm^3 volume. There is Fat Layer (Subcutaneous Tissue) Exposed exposed. There is no tunneling or undermining noted. There is [name] medium amount of serosanguineous drainage noted. The wound margin is epibole. There is large (67-100%) pink, pale granulation within the wound bed. There is no necrotic tissue within the wound bed. Wound #14 status is Open. Original cause of wound was Pressure Injury. The wound is located on the Left Calcaneus.  The wound measures 3cm length x 2.5cm width x 0.2cm depth; 5.89cm^2 area and 1.178cm^3 volume. There is Fat Layer (Subcutaneous Tissue) Exposed exposed. There is no tunneling or undermining noted. There is [name] medium amount of serosanguineous drainage noted. The wound margin is flat and intact. There is medium (34-66%) pink granulation within the wound bed. There is [name] medium (34-66%) amount of necrotic tissue within the wound bed including Eschar and Adherent Slough. 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 There was [name] Excisional Skin/Subcutaneous Tissue Debridement with [name] total area of 11 sq cm performed by Ricard Dillon., MD. With the following instrument(s): Curette to remove Viable and Non-Viable tissue/material. Material removed includes Subcutaneous Tissue and Slough and. No specimens were taken. [name] time out was conducted at 14:20, prior to the start of the procedure. [name] Minimum amount of bleeding was controlled with Pressure. The procedure was tolerated well with [name] pain level of 0 throughout and [name] pain level of 0 following the procedure. Post Debridement Measurements: 5.5cm length x 2cm width x 5.5cm depth; 47.517cm^3 volume. Post debridement Stage noted as Category/Stage IV. Character of Wound/Ulcer Post Debridement is improved. Post procedure Diagnosis Wound #1: Same as Pre-Procedure Wound #14 Pre-procedure diagnosis of Wound #14 is [name] Pressure Ulcer located on the Left Calcaneus . There was [name] Excisional Skin/Subcutaneous Tissue Debridement with [name] total area of 7.5 sq cm performed by Ricard Dillon., MD. With the following instrument(s): Curette to remove  Viable and Non-Viable tissue/material. Material removed includes Eschar, Subcutaneous Tissue, and Slough. No specimens were taken.  [name] time out was conducted at 14:20, prior to the start of the procedure. [name] Minimum amount of bleeding was controlled with Pressure. The procedure was tolerated well with [name] pain level of 0 throughout and [name] pain level of 0 following the procedure. Post Debridement Measurements: 3.5cm length x 2.5cm width x 0.2cm depth; 1.374cm^3 volume. Post debridement Stage noted as Category/Stage III. Character of Wound/Ulcer Post Debridement is improved. Post procedure Diagnosis Wound #14: Same as Pre-Procedure Plan Follow-up Appointments: Return Appointment in 2 weeks. - ****[facility] - ROOM 5**** Dressing Change Frequency: Wound #1 Medial Sacrum: Change Dressing every other day. Wound #14 Left Calcaneus: Change Dressing every other day. Skin Barriers/Peri-Wound Care: Wound #1 Medial Sacrum: Skin Prep Wound Cleansing: Wound #1 Medial Sacrum: Clean wound with Wound Cleanser - or normal saline Wound #14 Left Calcaneus: Clean wound with Wound Cleanser - or normal saline Primary Wound Dressing: Wound #1 Medial Sacrum: Silver Collagen - fill space with hydrogel moistened gauze Wound #14 Left Calcaneus: Calcium Alginate with Silver Secondary Dressing: Wound #1 Medial Sacrum: Foam Border Wound #14 Left Calcaneus: Kerlix/Rolled Gauze Dry Gauze Heel Cup Off-Loading: Multipodus Splint to: - prevelon boot or posey boot both feet at all times Low air-loss mattress (Group 2) Gel wheelchair cushion - continue Turn and reposition every 2 hours - sit upright while in chair, avoid slouching in chair Other: - offload heels with pillows under calves Other: - apply wet-to-dry dressing to surgical site on lower back today in clinic, home health to reapply wound vac per surgeon orders until we receive release from surgeon to care for wound Home Health: Admit to [facility]. I added silver collagen under the wet-to-dry on the sacral wound which is now [name] large postoperative  wound. 2.o Abscess. I need to see if there was underlying imaging study. Apparently her IV antibiotics are supposed to stop on 27 May which does not seem long enough if indeed she had osteomyelitis 3. Presumably pressure area on the left heel which after debridement is [name] stage III. 4. I would like to see her again in 2 weeks. If we can clean up the base of the large sacral wound I am not opposed to [name] wound VAC. This would give [place] time to see with the home health company is Electronic Signature(s) Signed: 07/21/2019 12:52:55 PM By: Linton Ham MD Entered By: Linton Ham on 07/18/2019 14:45:23 -------------------------------------------------------------------------------- SuperBill Details Patient Name: Date of Service: [name]. 07/18/2019 Medical Record Number: [id number] Patient Account Number: [id number] Date of Birth/Sex: Treating RN: [birth year] (39 y.o. Female) Levan Hurst Primary Care Provider: Jori Moll, Karsten Fells Other Clinician: Referring Provider: Treating Provider/Extender: Yevonne Aline, [name] Weeks in Treatment: 159 Diagnosis Coding ICD-10 Codes Code Description L89.154 Pressure ulcer of sacral region, stage 4 L89.613 Pressure ulcer of right heel, stage 3 G82.22 Paraplegia, incomplete Facility Procedures CPT4 Code: [id number] Description: [id number] - DEB SUBQ TISSUE 20 SQ CM/< ICD-10 Diagnosis Description L89.154 Pressure ulcer of sacral region, stage 4 L89.613 Pressure ulcer of right heel, stage 3 Modifier: Quantity: 1 Physician Procedures : CPT4 Code Description Modifier [id number] - WC PHYS SUBQ TISS 20 SQ CM ICD-10 Diagnosis Description L89.154 Pressure ulcer of sacral region, stage 4 L89.613 Pressure ulcer of right heel, stage 3 Quantity: 1 Electronic Signature(s) Signed: 07/21/2019 12:52:55 PM By: Linton Ham MD Entered By: Linton Ham on 07/18/2019  14:45:38 

## 2019-07-28 ENCOUNTER — Telehealth: Payer: Self-pay | Admitting: Family Medicine

## 2019-07-28 NOTE — Telephone Encounter (Signed)
Please call patient and let her know we were unable to find a home health company for her.  We tried 5 different companies.

## 2019-07-28 NOTE — Telephone Encounter (Signed)
Have still not had any home health agency that has the staffing to take patient, will start a PCS form for patient

## 2019-07-28 NOTE — Telephone Encounter (Signed)
Patient aware and verbalizes understanding. 

## 2019-08-02 ENCOUNTER — Encounter (HOSPITAL_BASED_OUTPATIENT_CLINIC_OR_DEPARTMENT_OTHER): Payer: No Typology Code available for payment source | Admitting: Internal Medicine

## 2019-08-03 ENCOUNTER — Telehealth: Payer: Self-pay | Admitting: *Deleted

## 2019-08-03 NOTE — Telephone Encounter (Signed)
Mother states the pic line was supposed to be removed but the hospital didn't remove it. She wants to know if [name] can write an order to have pic line removed and we could fax order to [facility] says they will remove it but need an order. They have tried getting an order from the physician at the hospital that had it placed and the wound care doctor won't give the order either. She went to the ED for it but they only put her foley back in and wouldn't remove the pic line. Please advise.

## 2019-08-03 NOTE — Telephone Encounter (Signed)
I requested records from [facility] where the PICC line was placed. I am slightly confused as their note from yesterday says they cannot remove it because they didn't place it, but in the discharge summary from her previous visit it says the PICC line was placed without complications. She has obviously completed the coarse of antibiotics, as it was only for 14 days. The problem is, whoever gives the order to remove the PICC line needs to see her to make sure she does not appear septic and there is no further infection. We are unable to get her on the exam table here. I see she has an appointment with Dr. Dellia Nims (wound care) on 08/09/19 and wonder if he will give the order to pull once he is able to visualize the wound. I tried to call his office but it was close to closing time and I was unable to reach anyone. Can we try to call his office back and check on this situation?

## 2019-08-03 NOTE — Telephone Encounter (Signed)
Mom aware we are waiting for [name] to review and reply.

## 2019-08-04 NOTE — Telephone Encounter (Signed)
Attempted to contact Dr. Janalyn Rouse office at [phone].  Left message for them to call me back about patient.

## 2019-08-05 NOTE — Telephone Encounter (Signed)
Covering provider is Dr. Darnell Level.  Discussed with Dr. Darnell Level and she states to find out who patient's ID was.  I called [facility] and spoke to patients case manager - Murriel Hopper Patient does not currently have ID. Murriel Hopper is going to reach out to [facility] to see why they can not sign for the PICC line to be removed and she will call me back.

## 2019-08-05 NOTE — Telephone Encounter (Signed)
Returned moms call about patient - states that she has talked with [place] with [facility] and Dr.Robson is on vacation this week and no one at his office will sign for the PICC line to be removed until he comes back from vacation.   Audry Pili also called [facility] and no doctors there will sign even though they put in the PICC line.  Patient went to [facility] on 6/1 and they would not pull the PICC line.  Mom also states that she called [facility] to see if they came up there would they take out the PICC line and she states they told her no that she needed to get the person who put in the PICC line.   I called Dr. Ruffin Frederick office and they have not returned my call.    Please advise what should be done.  Mom is concerned about patient since her PICC line was in longer than needed.

## 2019-08-09 ENCOUNTER — Encounter (HOSPITAL_BASED_OUTPATIENT_CLINIC_OR_DEPARTMENT_OTHER): Payer: Medicaid Other | Attending: Internal Medicine | Admitting: Internal Medicine

## 2019-08-09 DIAGNOSIS — L89613 Pressure ulcer of right heel, stage 3: Secondary | ICD-10-CM | POA: Diagnosis not present

## 2019-08-09 DIAGNOSIS — G8222 Paraplegia, incomplete: Secondary | ICD-10-CM | POA: Insufficient documentation

## 2019-08-09 DIAGNOSIS — L89154 Pressure ulcer of sacral region, stage 4: Secondary | ICD-10-CM | POA: Insufficient documentation

## 2019-08-10 ENCOUNTER — Encounter: Payer: Self-pay | Admitting: *Deleted

## 2019-08-10 NOTE — Progress Notes (Signed)
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 intact. There is no granulation within the wound bed. There is no necrotic tissue within the wound bed. Assessment Active Problems ICD-10 Pressure ulcer of sacral region, stage 4 Pressure ulcer of right heel, stage 3 Paraplegia, incomplete Plan Follow-up Appointments: Return Appointment in 2 weeks. - ****[facility] - ROOM 5**** Dressing Change  Frequency: Wound #1 Medial Sacrum: Change dressing every day. Wound Cleansing: Wound #1 Medial Sacrum: May shower and wash wound with soap and water. Primary Wound Dressing: Wound #1 Medial Sacrum: Hydrogel - hydrogel or KY jelly moistened gauze packed into wound Secondary Dressing: Wound #1 Medial Sacrum: Dry Gauze - secure with tape Off-Loading: Multipodus Splint to: - prevelon boot or posey boot both feet at all times Low air-loss mattress (Group 2) Gel wheelchair cushion - continue Turn and reposition every 2 hours - sit upright while in chair, avoid slouching in chair Other: - offload heels with pillows under calves 1. I went over the options here. Unfortunately she is not eligible for home health and has Medicaid which really limits what we can do to the supply here. I had ordered silver collagen last time I think they are just using a wet-to-dry. I recommended for now a K-Y jelly wet to dry. 2. I think the wound is probably too close to the gluteal  fold for a wound VAC seal in this area 3. She is completed antibiotics. I never did see the extent of her imaging and whether she had osteomyelitis based [facility] I will see if we can get a discharge summary. 4. The area on the left heel is callused over I could not prove that there is an open area here. Electronic Signature(s) Signed: 08/10/2019 4:17:30 PM By: Linton Ham MD Entered By: Linton Ham on 08/09/2019 15:15:25 -------------------------------------------------------------------------------- SuperBill Details Patient Name: Date of Service: Jamie Boyden A. 08/09/2019 Medical Record Number: [id number] Patient Account Number: [id number] Date of Birth/Sex: Treating RN: March 23, 1980 (39 y.o. [name] Primary Care Provider: Jori Moll, Karsten Fells Other Clinician: Referring Provider: Treating Provider/Extender: Yevonne Aline, [name] Weeks in Treatment: 162 Diagnosis Coding ICD-10 Codes Code  Description L89.154 Pressure ulcer of sacral region, stage 4 L89.613 Pressure ulcer of right heel, stage 3 G82.22 Paraplegia, incomplete Facility Procedures CPT4 Code: [id number] Description: 99213 - WOUND CARE VISIT-LEV 3 EST PT Modifier: Quantity: 1 Physician Procedures : CPT4 Code Description Modifier [id number] - WC PHYS LEVEL 3 - EST PT ICD-10 Diagnosis Description L89.154 Pressure ulcer of sacral region, stage 4 Quantity: 1 Electronic Signature(s) Signed: 08/10/2019 4:17:30 PM By: Linton Ham MD Entered By: Linton Ham on 08/09/2019 15:15:56

## 2019-08-11 ENCOUNTER — Telehealth: Payer: Self-pay | Admitting: Family Medicine

## 2019-08-11 NOTE — Telephone Encounter (Signed)
Pt aware I have contacted [facility] and will let her know when they call me back

## 2019-08-11 NOTE — Progress Notes (Signed)
JORDANNA, [name] ([id number]) Visit Report for 08/09/2019 Arrival Information Details Patient Name: Date of Service: [name], [name] 08/09/2019 1:30 PM Medical Record Number: [id number] Patient Account Number: [id number] Date of Birth/Sex: Treating RN: [birth year] (39 y.o. Nancy Fetter Primary Care Duilio Heritage: Jori Moll, Karsten Fells Other Clinician: Referring Ulanda Tackett: Treating Presly Steinruck/Extender: Yevonne Aline, [name] Weeks in Treatment: 33 Visit Information History Since Last Visit Added or deleted any medications: No Patient Arrived: Wheel Chair Any new allergies or adverse reactions: No Arrival Time: 14:21 Had a fall or experienced change in No Accompanied By: alone activities of daily living that may affect Transfer Assistance: Harrel Lemon Lift risk of falls: Patient Identification Verified: Yes Signs or symptoms of abuse/neglect since last visito No Secondary Verification Process Completed: Yes Hospitalized since last visit: No Patient Requires Transmission-Based Precautions: No Implantable device outside of the clinic excluding No Patient Has Alerts: No cellular tissue based products placed in the center since last visit: Has Dressing in Place as Prescribed: Yes Pain Present Now: No Electronic Signature(s) Signed: 08/11/2019 5:18:16 PM By: Levan Hurst RN, BSN Entered By: Levan Hurst on 08/09/2019 14:22:02 -------------------------------------------------------------------------------- Clinic Level of Care Assessment Details Patient Name: Date of Service: [name], Jamie A. 08/09/2019 1:30 PM Medical Record Number: [id number] Patient Account Number: [id number] Date of Birth/Sex: Treating RN: [birth year] (39 y.o. Orvan Falconer Primary Care Sabastian Raimondi: Jori Moll, Karsten Fells Other Clinician: Referring Sabrena Gavitt: Treating Shellia Hartl/Extender: Yevonne Aline, [name] Weeks in Treatment: [facility] Level of Care Assessment Items TOOL 4 Quantity Score X- 1 0 Use  when only an EandM is performed on FOLLOW-UP visit ASSESSMENTS - Nursing Assessment / Reassessment X- 1 10 Reassessment of Co-morbidities (includes updates in patient status) X- 1 5 Reassessment of Adherence to Treatment Plan ASSESSMENTS - Wound and Skin A ssessment / Reassessment X - Simple Wound Assessment / Reassessment - one wound 1 5 '[]'  - 0 Complex Wound Assessment / Reassessment - multiple wounds '[]'  - 0 Dermatologic / Skin Assessment (not related to wound area) ASSESSMENTS - Focused Assessment '[]'  - 0 Circumferential Edema Measurements - multi extremities '[]'  - 0 Nutritional Assessment / Counseling / Intervention '[]'  - 0 Lower Extremity Assessment (monofilament, tuning fork, pulses) '[]'  - 0 Peripheral Arterial Disease Assessment (using hand held doppler) ASSESSMENTS - Ostomy and/or Continence Assessment and Care '[]'  - 0 Incontinence Assessment and Management '[]'  - 0 Ostomy Care Assessment and Management (repouching, etc.) PROCESS - Coordination of Care X - Simple Patient / Family Education for ongoing care 1 15 '[]'  - 0 Complex (extensive) Patient / Family Education for ongoing care X- 1 10 Staff obtains Programmer, systems, Records, T Results / Process Orders est '[]'  - 0 Staff telephones HHA, Nursing Homes / Clarify orders / etc '[]'  - 0 Routine Transfer to another Facility (non-emergent condition) '[]'  - 0 Routine Hospital Admission (non-emergent condition) '[]'  - 0 New Admissions / Biomedical engineer / Ordering NPWT Apligraf, etc. , '[]'  - 0 Emergency Hospital Admission (emergent condition) X- 1 10 Simple Discharge Coordination '[]'  - 0 Complex (extensive) Discharge Coordination PROCESS - Special Needs '[]'  - 0 Pediatric / Minor Patient Management '[]'  - 0 Isolation Patient Management '[]'  - 0 Hearing / Language / Visual special needs '[]'  - 0 Assessment of Community assistance (transportation, D/C planning, etc.) '[]'  - 0 Additional assistance / Altered mentation '[]'  - 0 Support  Surface(s) Assessment (bed, cushion, seat, etc.) INTERVENTIONS - Wound Cleansing / Measurement X - Simple Wound Cleansing - one wound 1 5 '[]'  - 0  Complex Wound Cleansing - multiple wounds X- 1 5 Wound Imaging (photographs - any number of wounds) '[]'  - 0 Wound Tracing (instead of photographs) X- 1 5 Simple Wound Measurement - one wound '[]'  - 0 Complex Wound Measurement - multiple wounds INTERVENTIONS - Wound Dressings '[]'  - 0 Small Wound Dressing one or multiple wounds X- 1 15 Medium Wound Dressing one or multiple wounds '[]'  - 0 Large Wound Dressing one or multiple wounds X- 1 5 Application of Medications - topical '[]'  - 0 Application of Medications - injection INTERVENTIONS - Miscellaneous '[]'  - 0 External ear exam '[]'  - 0 Specimen Collection (cultures, biopsies, blood, body fluids, etc.) '[]'  - 0 Specimen(s) / Culture(s) sent or taken to Lab for analysis '[]'  - 0 Patient Transfer (multiple staff / Civil Service fast streamer / Similar devices) '[]'  - 0 Simple Staple / Suture removal (25 or less) '[]'  - 0 Complex Staple / Suture removal (26 or more) '[]'  - 0 Hypo / Hyperglycemic Management (close monitor of Blood Glucose) '[]'  - 0 Ankle / Brachial Index (ABI) - do not check if billed separately X- 1 5 Vital Signs Has the patient been seen at the hospital within the last three years: Yes Total Score: 95 Level Of Care: New/Established - Level 3 Electronic Signature(s) Signed: 08/09/2019 4:34:33 PM By: Carlene Coria RN Entered By: Carlene Coria on 08/09/2019 15:12:39 -------------------------------------------------------------------------------- Encounter Discharge Information Details Patient Name: Date of Service: Jamie Boyden A. 08/09/2019 1:30 PM Medical Record Number: [id number] Patient Account Number: [id number] Date of Birth/Sex: Treating RN: 05-23-1980 (39 y.o. Clearnce Sorrel Primary Care Aarianna Hoadley: Jori Moll, Karsten Fells Other Clinician: Referring Jahniya Duzan: Treating Stephan Draughn/Extender: Curlene Dolphin Weeks in Treatment: [phone] Encounter Discharge Information Items Discharge Condition: Stable Ambulatory Status: Wheelchair Discharge Destination: Home Transportation: Other Accompanied By: self Schedule Follow-up Appointment: Yes Clinical Summary of Care: Patient Declined Electronic Signature(s) Signed: 08/09/2019 4:53:58 PM By: Kela Millin Entered By: Kela Millin on 08/09/2019 15:47:17 -------------------------------------------------------------------------------- Lower Extremity Assessment Details Patient Name: Date of Service: [name], Jamie A. 08/09/2019 1:30 PM Medical Record Number: [id number] Patient Account Number: [id number] Date of Birth/Sex: Treating RN: April 05, 1980 (39 y.o. Nancy Fetter Primary Care Travone Georg: Jori Moll, Karsten Fells Other Clinician: Referring Brelan Hannen: Treating Juwana Thoreson/Extender: Yevonne Aline, [name] Weeks in Treatment: 162 Vascular Assessment Pulses: Dorsalis Pedis Palpable: [Left:Yes] Electronic Signature(s) Signed: 08/11/2019 5:18:16 PM By: Levan Hurst RN, BSN Entered By: Levan Hurst on 08/09/2019 14:22:59 -------------------------------------------------------------------------------- Multi Wound Chart Details Patient Name: Date of Service: Jamie Boyden A. 08/09/2019 1:30 PM Medical Record Number: [id number] Patient Account Number: [id number] Date of Birth/Sex: Treating RN: Jul 06, 1980 (40 y.o. Orvan Falconer Primary Care Yuleidy Rappleye: Jori Moll, Karsten Fells Other Clinician: Referring Yanelly Cantrelle: Treating Ethell Blatchford/Extender: Yevonne Aline, [name] Weeks in Treatment: 162 Vital Signs Height(in): 65 Pulse(bpm): 85 Weight(lbs): 201 Blood Pressure(mmHg): 122/85 Body Mass Index(BMI): 33 Temperature(F): 98.5 Respiratory Rate(breaths/min): 16 Photos: [1:No Photos Medial Sacrum] [14:No Photos Left Calcaneus] [N/A:N/A N/A] Wound Location: [1:Pressure Injury] [14:Pressure Injury]  [N/A:N/A] Wounding Event: [1:Pressure Ulcer] [14:Pressure Ulcer] [N/A:N/A] Primary Etiology: [1:Anemia, Osteomyelitis] [14:Anemia, Osteomyelitis] [N/A:N/A] Comorbid History: [1:05/15/2016] [14:07/18/2019] [N/A:N/A] Date Acquired: [1:162] [14:3] [N/A:N/A] Weeks of Treatment: [1:Open] [14:Open] [N/A:N/A] Wound Status: [1:4.8x3.2x4] [14:0x0x0] [N/A:N/A] Measurements L x W x D (cm) [1:12.064] [14:0] [N/A:N/A] A (cm) : rea [1:48.255] [14:0] [N/A:N/A] Volume (cm) : [1:63.30%] [14:100.00%] [N/A:N/A] % Reduction in A rea: [1:66.70%] [14:100.00%] [N/A:N/A] % Reduction in Volume: [1:Category/Stage IV] [14:Category/Stage III] [N/A:N/A] Classification: [1:Medium] [14:None Present] [N/A:N/A] Exudate A mount: [1:Serosanguineous] [14:N/A] [N/A:N/A]  Exudate Type: [1:red, brown] [14:N/A] [N/A:N/A] Exudate Color: [1:Epibole] [14:Flat and Intact] [N/A:N/A] Wound Margin: [1:Medium (34-66%)] [14:None Present (0%)] [N/A:N/A] Granulation A mount: [1:Red, Pink] [14:N/A] [N/A:N/A] Granulation Quality: [1:Medium (34-66%)] [14:None Present (0%)] [N/A:N/A] Necrotic A mount: [1:Fat Layer (Subcutaneous Tissue)] [14:Fascia: No] [N/A:N/A] Exposed Structures: [1:Exposed: Yes Fascia: No Tendon: No Muscle: No Joint: No Bone: No Medium (34-66%)] [14:Fat Layer (Subcutaneous Tissue) Exposed: No Tendon: No Muscle: No Joint: No Bone: No Large (67-100%)] [N/A:N/A] Treatment Notes Electronic Signature(s) Signed: 08/09/2019 4:34:33 PM By: Carlene Coria RN Signed: 08/10/2019 4:17:30 PM By: Linton Ham MD Entered By: Linton Ham on 08/09/2019 15:09:50 -------------------------------------------------------------------------------- Multi-Disciplinary Care Plan Details Patient Name: Date of Service: Jamie Boyden A. 08/09/2019 1:30 PM Medical Record Number: [id number] Patient Account Number: [id number] Date of Birth/Sex: Treating RN: [birth year] (39 y.o. Orvan Falconer Primary Care Lavonda Thal: Jori Moll, Karsten Fells Other  Clinician: Referring Kyandra Mcclaine: Treating Aldena Worm/Extender: Yevonne Aline, [name] Weeks in Treatment: 162 Active Inactive Pressure Nursing Diagnoses: Knowledge deficit related to management of pressures ulcers Potential for impaired tissue integrity related to pressure, friction, moisture, and shear Goals: Patient will remain free from development of additional pressure ulcers Date Initiated: 06/27/2016 Date Inactivated: 01/17/2019 Target Resolution Date: 12/24/2018 Goal Status: Met Patient/caregiver will verbalize risk factors for pressure ulcer development Date Initiated: 06/27/2016 Date Inactivated: 07/08/2017 Target Resolution Date: 07/08/2017 Goal Status: Met Patient/caregiver will verbalize understanding of pressure ulcer management Date Initiated: 06/27/2016 Target Resolution Date: 08/19/2019 Goal Status: Active Interventions: Assess: immobility, friction, shearing, incontinence upon admission and as needed Assess offloading mechanisms upon admission and as needed Assess potential for pressure ulcer upon admission and as needed Provide education on pressure ulcers Treatment Activities: Patient referred for pressure reduction/relief devices : 06/27/2016 Pressure reduction/relief device ordered : 06/27/2016 Notes: Wound/Skin Impairment Nursing Diagnoses: Impaired tissue integrity Knowledge deficit related to smoking impact on wound healing Knowledge deficit related to ulceration/compromised skin integrity Goals: Patient will demonstrate a reduced rate of smoking or cessation of smoking Date Initiated: 06/27/2016 Date Inactivated: 01/26/2017 Target Resolution Date: 01/08/2017 Goal Status: Unmet Unmet Reason: pt continues to smoke Patient/caregiver will verbalize understanding of skin care regimen Date Initiated: 06/27/2016 Target Resolution Date: 08/19/2019 Goal Status: Active Ulcer/skin breakdown will have a volume reduction of 50% by week 8 Date Initiated:  06/27/2016 Date Inactivated: 12/11/2016 Target Resolution Date: 07/25/2016 Goal Status: Met Interventions: Assess patient/caregiver ability to obtain necessary supplies Assess patient/caregiver ability to perform ulcer/skin care regimen upon admission and as needed Assess ulceration(s) every visit Provide education on smoking Provide education on ulcer and skin care Treatment Activities: Skin care regimen initiated : 06/27/2016 Topical wound management initiated : 06/27/2016 Notes: Electronic Signature(s) Signed: 08/09/2019 4:34:33 PM By: Carlene Coria RN Entered By: Carlene Coria on 08/09/2019 14:31:45 -------------------------------------------------------------------------------- Pain Assessment Details Patient Name: Date of Service: [name], Jamie A. 08/09/2019 1:30 PM Medical Record Number: [id number] Patient Account Number: [id number] Date of Birth/Sex: Treating RN: [birth year] (39 y.o. Nancy Fetter Primary Care Hebah Bogosian: Jori Moll, Karsten Fells Other Clinician: Referring Shira Bobst: Treating Javohn Basey/Extender: Yevonne Aline, [name] Weeks in Treatment: [phone] Active Problems Location of Pain Severity and Description of Pain Patient Has Paino No Site Locations Pain Management and Medication Current Pain Management: Electronic Signature(s) Signed: 08/11/2019 5:18:16 PM By: Levan Hurst RN, BSN Entered By: Levan Hurst on 08/09/2019 14:22:41 -------------------------------------------------------------------------------- Patient/Caregiver Education Details Patient Name: Date of Service: Karlton Lemon 6/8/2021andnbsp1:30 PM Medical Record Number: [id number] Patient Account Number: [id number] Date of Birth/Gender: Treating RN: [birth year] (39 y.o. F)  Carlene Coria Primary Care Physician: Jori Moll, Karsten Fells Other Clinician: Referring Physician: Treating Physician/Extender: Vella Kohler in Treatment: [phone] Education Assessment Education  Provided To: Patient Education Topics Provided Pressure: Methods: Explain/Verbal Responses: State content correctly Wound/Skin Impairment: Methods: Explain/Verbal Responses: State content correctly Electronic Signature(s) Signed: 08/09/2019 4:34:33 PM By: Carlene Coria RN Entered By: Carlene Coria on 08/09/2019 14:32:08 -------------------------------------------------------------------------------- Wound Assessment Details Patient Name: Date of Service: [name], Jamie A. 08/09/2019 1:30 PM Medical Record Number: [id number] Patient Account Number: [id number] Date of Birth/Sex: Treating RN: May 20, 1980 (39 y.o. Nancy Fetter Primary Care Justise Ehmann: Jori Moll, Karsten Fells Other Clinician: Referring Sadee Osland: Treating Broady Lafoy/Extender: Yevonne Aline, [name] Weeks in Treatment: 162 Wound Status Wound Number: 1 Primary Etiology: Pressure Ulcer Wound Location: Medial Sacrum Wound Status: Open Wounding Event: Pressure Injury Comorbid History: Anemia, Osteomyelitis Date Acquired: 05/15/2016 Weeks Of Treatment: 162 Clustered Wound: No Photos Photo Uploaded By: Mikeal Hawthorne on 08/10/2019 14:29:33 Wound Measurements Length: (cm) 4.8 Width: (cm) 3.2 Depth: (cm) 4 Area: (cm) 12.064 Volume: (cm) 48.255 % Reduction in Area: 63.3% % Reduction in Volume: 66.7% Epithelialization: Medium (34-66%) Tunneling: No Undermining: No Wound Description Classification: Category/Stage IV Wound Margin: Epibole Exudate Amount: Medium Exudate Type: Serosanguineous Exudate Color: red, brown Foul Odor After Cleansing: No Slough/Fibrino Yes Wound Bed Granulation Amount: Medium (34-66%) Exposed Structure Granulation Quality: Red, Pink Fascia Exposed: No Necrotic Amount: Medium (34-66%) Fat Layer (Subcutaneous Tissue) Exposed: Yes Necrotic Quality: Adherent Slough Tendon Exposed: No Muscle Exposed: No Joint Exposed: No Bone Exposed: No Treatment Notes Wound #1 (Medial Sacrum) 1.  Cleanse With Wound Cleanser 2. Periwound Care Skin Prep 3. Primary Dressing Applied Other primary dressing (specifiy in notes) 4. Secondary Dressing ABD Pad 5. Secured With Tape Notes hydrogel moisten gauze backing the silver collagen. Electronic Signature(s) Signed: 08/11/2019 5:18:16 PM By: Levan Hurst RN, BSN Entered By: Levan Hurst on 08/09/2019 14:24:26 -------------------------------------------------------------------------------- Wound Assessment Details Patient Name: Date of Service: Jamie Boyden A. 08/09/2019 1:30 PM Medical Record Number: [id number] Patient Account Number: [id number] Date of Birth/Sex: Treating RN: [birth year] (39 y.o. Nancy Fetter Primary Care Shep Porter: Jori Moll, Karsten Fells Other Clinician: Referring Sahej Schrieber: Treating Johnavon Mcclafferty/Extender: Yevonne Aline, [name] Weeks in Treatment: 162 Wound Status Wound Number: 14 Primary Etiology: Pressure Ulcer Wound Location: Left Calcaneus Wound Status: Open Wounding Event: Pressure Injury Comorbid History: Anemia, Osteomyelitis Date Acquired: 07/18/2019 Weeks Of Treatment: 3 Clustered Wound: No Photos Photo Uploaded By: Mikeal Hawthorne on 08/10/2019 14:29:34 Wound Measurements Length: (cm) Width: (cm) Depth: (cm) Area: (cm) Volume: (cm) 0 % Reduction in Area: 100% 0 % Reduction in Volume: 100% 0 Epithelialization: Large (67-100%) 0 Tunneling: No 0 Undermining: No Wound Description Classification: Category/Stage III Wound Margin: Flat and Intact Exudate Amount: None Present Foul Odor After Cleansing: No Slough/Fibrino No Wound Bed Granulation Amount: None Present (0%) Exposed Structure Necrotic Amount: None Present (0%) Fascia Exposed: No Fat Layer (Subcutaneous Tissue) Exposed: No Tendon Exposed: No Muscle Exposed: No Joint Exposed: No Bone Exposed: No Electronic Signature(s) Signed: 08/11/2019 5:18:16 PM By: Levan Hurst RN, BSN Entered By: Levan Hurst on  08/09/2019 14:25:05 -------------------------------------------------------------------------------- [place] Details Patient Name: Date of Service: Jamie Boyden A. 08/09/2019 1:30 PM Medical Record Number: [id number] Patient Account Number: [id number] Date of Birth/Sex: Treating RN: 1980/08/16 (39 y.o. Nancy Fetter Primary Care Camiyah Friberg: Jori Moll, Karsten Fells Other Clinician: Referring Lasheena Frieze: Treating Juanell Saffo/Extender: Yevonne Aline, [name] Weeks in Treatment: 162 Vital Signs Time Taken: 14:22 Temperature (F): 98.5 Height (in): 65  Pulse (bpm): 85 Weight (lbs): 201 Respiratory Rate (breaths/min): 16 Body Mass Index (BMI): 33.4 Blood Pressure (mmHg): 122/85 Reference Range: 80 - 120 mg / dl Electronic Signature(s) Signed: 08/11/2019 5:18:16 PM By: Levan Hurst RN, BSN Entered By: Levan Hurst on 08/09/2019 14:22:26

## 2019-08-11 NOTE — Telephone Encounter (Signed)
[facility] does not take [employer] insurance. Checking with [facility]

## 2019-08-11 NOTE — Telephone Encounter (Signed)
For review

## 2019-08-12 ENCOUNTER — Encounter (HOSPITAL_COMMUNITY): Payer: Self-pay

## 2019-08-12 ENCOUNTER — Emergency Department (HOSPITAL_COMMUNITY)
Admission: EM | Admit: 2019-08-12 | Discharge: 2019-08-12 | Disposition: A | Discharge status: Home / Self Care | Payer: Medicaid Other | Attending: Emergency Medicine | Admitting: Emergency Medicine

## 2019-08-12 ENCOUNTER — Other Ambulatory Visit: Payer: Self-pay

## 2019-08-12 DIAGNOSIS — E039 Hypothyroidism, unspecified: Secondary | ICD-10-CM | POA: Diagnosis not present

## 2019-08-12 DIAGNOSIS — Z466 Encounter for fitting and adjustment of urinary device: Secondary | ICD-10-CM | POA: Diagnosis present

## 2019-08-12 DIAGNOSIS — T83098A Other mechanical complication of other indwelling urethral catheter, initial encounter: Secondary | ICD-10-CM | POA: Insufficient documentation

## 2019-08-12 DIAGNOSIS — F17210 Nicotine dependence, cigarettes, uncomplicated: Secondary | ICD-10-CM | POA: Diagnosis not present

## 2019-08-12 DIAGNOSIS — T839XXA Unspecified complication of genitourinary prosthetic device, implant and graft, initial encounter: Secondary | ICD-10-CM

## 2019-08-12 MED ORDER — OXYCODONE HCL 5 MG PO TABS
5.0000 mg | ORAL_TABLET | Freq: Once | ORAL | Status: AC
  Administered 2019-08-12: 5 mg via ORAL
  Filled 2019-08-12: qty 1

## 2019-08-12 NOTE — ED Notes (Signed)
MD at bedside, number 96fr foley cath with 30 ml balloon placed by md, urine out, pt tolerated well.

## 2019-08-12 NOTE — ED Triage Notes (Signed)
PT arrived via ems, reports foley cath fell out approx 53min pta.  Denies any pain.  EDP assessed pt and ok to replace foley and send pt back home with ems.

## 2019-08-12 NOTE — ED Notes (Signed)
Pt has aprox 100 ml of urine in her foley bag, md notified, pt ok for discharge, ride being coordinated.

## 2019-08-12 NOTE — ED Notes (Signed)
Pt in bed, pt states that she needs an 7fr foley cath with a 64ml balloon, Foley cath placement unsuccessful by three nurses, md notified, md at bedside attempting foley cath placement, md attempting a 108fr with 30 ml balloon, states that he has visualized urethra, confirmed with bedside ultrasound, md attempted to irrigate foley due to minimal urine in the bladder to further confirm placement with ultrasound, irrigation flowed out of pt, d/c foley cath at this time.

## 2019-08-12 NOTE — Discharge Instructions (Signed)
See your doctor as needed ER for pain or fevers or problems with the catheter
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
## 2019-08-15 NOTE — Telephone Encounter (Signed)
[facility] unable to take patient

## 2019-08-15 NOTE — Telephone Encounter (Addendum)
[facility] unable to take pt at this time, checking with [facility]

## 2019-08-16 ENCOUNTER — Encounter (HOSPITAL_COMMUNITY): Payer: Self-pay | Admitting: Emergency Medicine

## 2019-08-16 ENCOUNTER — Other Ambulatory Visit: Payer: Self-pay

## 2019-08-16 ENCOUNTER — Emergency Department (HOSPITAL_COMMUNITY)
Admission: EM | Admit: 2019-08-16 | Discharge: 2019-08-17 | Disposition: A | Discharge status: Home / Self Care | Payer: Medicaid Other | Attending: Emergency Medicine | Admitting: Emergency Medicine

## 2019-08-16 DIAGNOSIS — F17210 Nicotine dependence, cigarettes, uncomplicated: Secondary | ICD-10-CM | POA: Insufficient documentation

## 2019-08-16 DIAGNOSIS — T83021A Displacement of indwelling urethral catheter, initial encounter: Secondary | ICD-10-CM

## 2019-08-16 DIAGNOSIS — Z79899 Other long term (current) drug therapy: Secondary | ICD-10-CM | POA: Diagnosis not present

## 2019-08-16 DIAGNOSIS — T83028A Displacement of other indwelling urethral catheter, initial encounter: Secondary | ICD-10-CM | POA: Diagnosis not present

## 2019-08-16 DIAGNOSIS — R140 Abdominal distension (gaseous): Secondary | ICD-10-CM | POA: Insufficient documentation

## 2019-08-16 DIAGNOSIS — E039 Hypothyroidism, unspecified: Secondary | ICD-10-CM | POA: Diagnosis not present
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
## 2019-08-16 NOTE — ED Triage Notes (Signed)
Pt here via EMS with reports of foley catheter out. States balloon started leaking then fell out.

## 2019-08-17 ENCOUNTER — Other Ambulatory Visit: Payer: Self-pay | Admitting: Family Medicine

## 2019-08-17 NOTE — Telephone Encounter (Signed)
LMOVM that I have tried all [place] agencies and explained all the reasons that they have not been able to accept her and unfortunately she will have to continue home dressing changes herself

## 2019-08-17 NOTE — Discharge Instructions (Addendum)
Recheck as needed °

## 2019-08-17 NOTE — Telephone Encounter (Signed)
LMOVM for pt to call and make appt to be seen we do not refill antibiotics without an appointment

## 2019-08-17 NOTE — Telephone Encounter (Signed)
  Prescription Request  08/17/2019  What is the name of the medication or equipment? Sulfamethoxazole-TMP-SS- Tablet. Was given at [facility] has been treating via telephone. Patient will be out today  Have you contacted your pharmacy to request a refill? (if applicable) NO  Which pharmacy would you like this sent to? [facility] in [place]   Patient notified that their request is being sent to the clinical staff for review and that they should receive a response within 2 business days.

## 2019-08-17 NOTE — Telephone Encounter (Signed)
[facility] unable to take pt at this time either

## 2019-08-23 ENCOUNTER — Other Ambulatory Visit: Payer: Self-pay

## 2019-08-23 ENCOUNTER — Encounter (HOSPITAL_BASED_OUTPATIENT_CLINIC_OR_DEPARTMENT_OTHER): Payer: Medicaid Other | Admitting: Internal Medicine

## 2019-08-23 DIAGNOSIS — L89154 Pressure ulcer of sacral region, stage 4: Secondary | ICD-10-CM | POA: Diagnosis not present

## 2019-08-24 NOTE — Progress Notes (Signed)
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 Stated, BMI: 33.4, Temperature: 97.9 F, Pulse: 83 bpm, Respiratory Rate: 18 breaths/min, Blood Pressure: 130/87 mmHg. General Notes: Wound exam; large postsurgical wound. There is no exposed bone here which is quite an improvement. Mostly healthy granulation some slough but I did not debride this. There is no evidence of surrounding soft tissue infection. Everything looks  quite good. ooThe area on her heel right heel is fully epithelialized eschared over but I do not think there is an open area here I did not debride this area Integumentary (Hair, Skin) Wound #1 status is Open. Original cause of wound was Pressure Injury. The wound is located on the Medial Sacrum. The wound measures 3.8cm length x 3.5cm width x 3.6cm depth; 10.446cm^2 area and 37.605cm^3 volume. There is Fat Layer (Subcutaneous Tissue) Exposed exposed. There is no tunneling noted, however, there is undermining starting at 12:00 and ending at 6:00 with a maximum distance of 2cm. There is a medium amount of serosanguineous drainage noted. The wound margin is epibole. There is large (67-100%) red granulation within the wound bed. There is a small (1-33%) amount of necrotic tissue within the wound bed including Adherent Slough. Assessment Active Problems ICD-10 Pressure ulcer of sacral region, stage 4 Paraplegia, incomplete Pressure ulcer of right heel, stage 3 Plan Follow-up Appointments: Return appointment in 1 month. Dressing Change Frequency: Change dressing every day. Wound  Cleansing: May shower and wash wound with soap and water. Primary Wound Dressing: Calcium Alginate with Silver Secondary Dressing: Dry Gauze ABD pad - secure with tape 1. Silver alginate with backing dry gauze 2. She seems to be doing well with this there was exposed bone last time but not at this time this would indicate the tissue is come in 3. Unfortunately this is too close to her gluteal cleft to consider a wound VAC Electronic Signature(s) Signed: 08/23/2019 5:20:04 PM By: Linton Ham MD Entered By: Linton Ham on 08/23/2019 15:00:08 -------------------------------------------------------------------------------- SuperBill Details Patient Name: Date of Service: Jamie Boyden A. 08/23/2019 Medical Record Number: [id number] Patient Account Number: [id number] Date of Birth/Sex: Treating  RN: 1980/09/23 (39 y.o. [name] Primary Care Provider: Jori [name], [name] Other Clinician: Referring Provider: Treating Provider/Extender: [name], [name] in Treatment: 164 Diagnosis Coding ICD-10 Codes Code Description L89.154 Pressure ulcer of sacral region, stage 4 G82.22 Paraplegia, incomplete L89.613 Pressure ulcer of right heel, stage 3 Facility Procedures CPT4 Code: [id number] Description: 99213 - WOUND CARE VISIT-LEV 3 EST PT Modifier: Quantity: 1 Physician Procedures : CPT4 Code Description Modifier [id number] - WC PHYS LEVEL 3 - EST PT 1 ICD-10 Diagnosis Description L89.154 Pressure ulcer of sacral region, stage 4 L89.613 Pressure ulcer of right heel, stage 3 Quantity: Electronic Signature(s) Signed: 08/23/2019 5:20:04 PM By: Linton Ham MD Entered By: Linton Ham on 08/23/2019 15:00:26

## 2019-08-24 NOTE — Progress Notes (Addendum)
ANNIECE, BLEILER ([id number]) Visit Report for 08/23/2019 Arrival Information Details Patient Name: Date of Service: ALONIE, GAZZOLA 08/23/2019 1:30 PM Medical Record Number: [id number] Patient Account Number: [id number] Date of Birth/Sex: Treating RN: 05-09-1980 (39 y.o. [name] Primary Care [place]: Jori Moll, Karsten Fells Other Clinician: Referring Sahar Ryback: Treating Zylpha Poynor/Extender: Yevonne Aline, [name] Weeks in Treatment: 164 Visit Information History Since Last Visit Added or deleted any medications: No Patient Arrived: Wheel Chair Any new allergies or adverse reactions: No Arrival Time: 13:52 Had a fall or experienced change in No Accompanied By: mother activities of daily living that may affect Transfer Assistance: Harrel Lemon Lift risk of falls: Patient Identification Verified: Yes Signs or symptoms of abuse/neglect since last visito No Secondary Verification Process Completed: Yes Hospitalized since last visit: No Patient Requires Transmission-Based Precautions: No Implantable device outside of the clinic excluding No Patient Has Alerts: No cellular tissue based products placed in the center since last visit: Has Dressing in Place as Prescribed: Yes Pain Present Now: Yes Electronic Signature(s) Signed: 08/23/2019 5:28:52 PM By: Baruch Gouty RN, BSN Entered By: Baruch Gouty on 08/23/2019 13:52:50 -------------------------------------------------------------------------------- Clinic Level of Care Assessment Details Patient Name: Date of Service: LAVAEH, BAU A. 08/23/2019 1:30 PM Medical Record Number: [id number] Patient Account Number: [id number] Date of Birth/Sex: Treating RN: 03-16-1980 (39 y.o. Orvan Falconer Primary Care Cecily Lawhorne: Jori Moll, Karsten Fells Other Clinician: Referring Rollyn Scialdone: Treating Nahzir Pohle/Extender: Yevonne Aline, [name] Weeks in Treatment: 164 Clinic Level of Care Assessment Items TOOL 4 Quantity Score X- 1  0 Use when only an EandM is performed on FOLLOW-UP visit ASSESSMENTS - Nursing Assessment / Reassessment X- 1 10 Reassessment of Co-morbidities (includes updates in patient status) X- 1 5 Reassessment of Adherence to Treatment Plan ASSESSMENTS - Wound and Skin A ssessment / Reassessment X - Simple Wound Assessment / Reassessment - one wound 1 5 '[]'  - 0 Complex Wound Assessment / Reassessment - multiple wounds '[]'  - 0 Dermatologic / Skin Assessment (not related to wound area) ASSESSMENTS - Focused Assessment '[]'  - 0 Circumferential Edema Measurements - multi extremities '[]'  - 0 Nutritional Assessment / Counseling / Intervention '[]'  - 0 Lower Extremity Assessment (monofilament, tuning fork, pulses) '[]'  - 0 Peripheral Arterial Disease Assessment (using hand held doppler) ASSESSMENTS - Ostomy and/or Continence Assessment and Care '[]'  - 0 Incontinence Assessment and Management '[]'  - 0 Ostomy Care Assessment and Management (repouching, etc.) PROCESS - Coordination of Care X - Simple Patient / Family Education for ongoing care 1 15 '[]'  - 0 Complex (extensive) Patient / Family Education for ongoing care X- 1 10 Staff obtains Programmer, systems, Records, T Results / Process Orders est '[]'  - 0 Staff telephones HHA, Nursing Homes / Clarify orders / etc '[]'  - 0 Routine Transfer to another Facility (non-emergent condition) '[]'  - 0 Routine Hospital Admission (non-emergent condition) '[]'  - 0 New Admissions / Biomedical engineer / Ordering NPWT Apligraf, etc. , '[]'  - 0 Emergency Hospital Admission (emergent condition) X- 1 10 Simple Discharge Coordination '[]'  - 0 Complex (extensive) Discharge Coordination PROCESS - Special Needs '[]'  - 0 Pediatric / Minor Patient Management '[]'  - 0 Isolation Patient Management '[]'  - 0 Hearing / Language / Visual special needs '[]'  - 0 Assessment of Community assistance (transportation, D/C planning, etc.) '[]'  - 0 Additional assistance / Altered mentation '[]'  -  0 Support Surface(s) Assessment (bed, cushion, seat, etc.) INTERVENTIONS - Wound Cleansing / Measurement X - Simple Wound Cleansing - one wound 1 5 '[]'  - 0  Complex Wound Cleansing - multiple wounds X- 1 5 Wound Imaging (photographs - any number of wounds) '[]'  - 0 Wound Tracing (instead of photographs) X- 1 5 Simple Wound Measurement - one wound '[]'  - 0 Complex Wound Measurement - multiple wounds INTERVENTIONS - Wound Dressings '[]'  - 0 Small Wound Dressing one or multiple wounds X- 1 15 Medium Wound Dressing one or multiple wounds '[]'  - 0 Large Wound Dressing one or multiple wounds X- 1 5 Application of Medications - topical '[]'  - 0 Application of Medications - injection INTERVENTIONS - Miscellaneous '[]'  - 0 External ear exam '[]'  - 0 Specimen Collection (cultures, biopsies, blood, body fluids, etc.) '[]'  - 0 Specimen(s) / Culture(s) sent or taken to Lab for analysis '[]'  - 0 Patient Transfer (multiple staff / Civil Service fast streamer / Similar devices) '[]'  - 0 Simple Staple / Suture removal (25 or less) '[]'  - 0 Complex Staple / Suture removal (26 or more) '[]'  - 0 Hypo / Hyperglycemic Management (close monitor of Blood Glucose) '[]'  - 0 Ankle / Brachial Index (ABI) - do not check if billed separately X- 1 5 Vital Signs Has the patient been seen at the hospital within the last three years: Yes Total Score: 95 Level Of Care: New/Established - Level 3 Electronic Signature(s) Signed: 08/24/2019 4:51:47 PM By: Carlene Coria RN Entered By: Carlene Coria on 08/23/2019 14:35:19 -------------------------------------------------------------------------------- Encounter Discharge Information Details Patient Name: Date of Service: [name] 08/23/2019 1:30 PM Medical Record Number: [id number] Patient Account Number: [id number] Date of Birth/Sex: Treating RN: 14-Aug-1980 (39 y.o. Clearnce Sorrel Primary Care Leathie Weich: Jori Moll, Karsten Fells Other Clinician: Referring Takeo Harts: Treating  Kahmya Pinkham/Extender: Curlene Dolphin Weeks in Treatment: [phone] Encounter Discharge Information Items Discharge Condition: Stable Ambulatory Status: Wheelchair Discharge Destination: Home Transportation: Other Accompanied By: mother Schedule Follow-up Appointment: Yes Clinical Summary of Care: Patient Declined Electronic Signature(s) Signed: 08/23/2019 5:14:59 PM By: Kela Millin Entered By: Kela Millin on 08/23/2019 15:05:41 -------------------------------------------------------------------------------- Lower Extremity Assessment Details Patient Name: Date of Service: SHANETA, CERVENKA A. 08/23/2019 1:30 PM Medical Record Number: [id number] Patient Account Number: [id number] Date of Birth/Sex: Treating RN: 29-Oct-1980 (39 y.o. [name] Primary Care Jaidynn Balster: Jori Moll, Karsten Fells Other Clinician: Referring Maysel Mccolm: Treating Bexleigh Theriault/Extender: Curlene Dolphin Weeks in Treatment: 164 Electronic Signature(s) Signed: 08/23/2019 5:28:52 PM By: Baruch Gouty RN, BSN Entered By: Baruch Gouty on 08/23/2019 14:03:48 -------------------------------------------------------------------------------- Multi Wound Chart Details Patient Name: Date of Service: [name] 08/23/2019 1:30 PM Medical Record Number: [id number] Patient Account Number: [id number] Date of Birth/Sex: Treating RN: 03-24-1980 (39 y.o. Orvan Falconer Primary Care Zaara Sprowl: Jori Moll, Karsten Fells Other Clinician: Referring Saadiq Poche: Treating Josilynn Losh/Extender: Yevonne Aline, [name] Weeks in Treatment: 164 Vital Signs Height(in): 65 Pulse(bpm): 83 Weight(lbs): 201 Blood Pressure(mmHg): 130/87 Body Mass Index(BMI): 33 Temperature(F): 97.9 Respiratory Rate(breaths/min): 18 Photos: [1:No Photos Medial Sacrum] [N/A:N/A N/A] Wound Location: [1:Pressure Injury] [N/A:N/A] Wounding Event: [1:Pressure Ulcer] [N/A:N/A] Primary Etiology: [1:Anemia, Osteomyelitis]  [N/A:N/A] Comorbid History: [1:05/15/2016] [N/A:N/A] Date Acquired: [2:633] [N/A:N/A] Weeks of Treatment: [1:Open] [N/A:N/A] Wound Status: [1:3.8x3.5x3.6] [N/A:N/A] Measurements L x W x D (cm) [1:10.446] [N/A:N/A] A (cm) : rea [1:37.605] [N/A:N/A] Volume (cm) : [1:68.20%] [N/A:N/A] % Reduction in A rea: [1:74.00%] [N/A:N/A] % Reduction in Volume: [1:12] Starting Position 1 (o'clock): [1:6] Ending Position 1 (o'clock): [1:2] Maximum Distance 1 (cm): [1:Yes] [N/A:N/A] Undermining: [1:Category/Stage IV] [N/A:N/A] Classification: [1:Medium] [N/A:N/A] Exudate A mount: [1:Serosanguineous] [N/A:N/A] Exudate Type: [1:red, brown] [N/A:N/A] Exudate Color: [1:Epibole] [N/A:N/A] Wound Margin: [1:Large (  67-100%)] [N/A:N/A] Granulation A mount: [1:Red] [N/A:N/A] Granulation Quality: [1:Small (1-33%)] [N/A:N/A] Necrotic A mount: [1:Fat Layer (Subcutaneous Tissue)] [N/A:N/A] Exposed Structures: [1:Exposed: Yes Fascia: No Tendon: No Muscle: No Joint: No Bone: No Medium (34-66%)] [N/A:N/A] Treatment Notes Electronic Signature(s) Signed: 08/23/2019 5:20:04 PM By: Linton Ham MD Signed: 08/24/2019 4:51:47 PM By: Carlene Coria RN Entered By: Linton Ham on 08/23/2019 14:54:03 -------------------------------------------------------------------------------- Multi-Disciplinary Care Plan Details Patient Name: Date of Service: [name] 08/23/2019 1:30 PM Medical Record Number: [id number] Patient Account Number: [id number] Date of Birth/Sex: Treating RN: 02-Jun-1980 (39 y.o. Orvan Falconer Primary Care Chioke Noxon: Jori Moll, Karsten Fells Other Clinician: Referring Maximos Zayas: Treating Eliott Amparan/Extender: Yevonne Aline, [name] Weeks in Treatment: 164 Active Inactive Wound/Skin Impairment Nursing Diagnoses: Impaired tissue integrity Knowledge deficit related to smoking impact on wound healing Knowledge deficit related to ulceration/compromised skin integrity Goals: Patient will  demonstrate a reduced rate of smoking or cessation of smoking Date Initiated: 06/27/2016 Date Inactivated: 01/26/2017 Target Resolution Date: 01/08/2017 Goal Status: Unmet Unmet Reason: pt continues to smoke Patient/caregiver will verbalize understanding of skin care regimen Date Initiated: 06/27/2016 Target Resolution Date: 09/19/2019 Goal Status: Active Ulcer/skin breakdown will have a volume reduction of 50% by week 8 Date Initiated: 06/27/2016 Date Inactivated: 12/11/2016 Target Resolution Date: 07/25/2016 Goal Status: Met Interventions: Assess patient/caregiver ability to obtain necessary supplies Assess patient/caregiver ability to perform ulcer/skin care regimen upon admission and as needed Assess ulceration(s) every visit Provide education on smoking Provide education on ulcer and skin care Treatment Activities: Skin care regimen initiated : 06/27/2016 Topical wound management initiated : 06/27/2016 Notes: Electronic Signature(s) Signed: 08/24/2019 4:51:47 PM By: Carlene Coria RN Entered By: Carlene Coria on 08/23/2019 13:41:23 -------------------------------------------------------------------------------- Pain Assessment Details Patient Name: Date of Service: ALAILA, PILLARD 08/23/2019 1:30 PM Medical Record Number: [id number] Patient Account Number: [id number] Date of Birth/Sex: Treating RN: 19-Apr-1980 (39 y.o. [name] Primary Care Gordon Carlson: Jori Moll, Karsten Fells Other Clinician: Referring Marios Gaiser: Treating Mareesa Gathright/Extender: Yevonne Aline, [name] Weeks in Treatment: [phone] Active Problems Location of Pain Severity and Description of Pain Patient Has Paino Yes Site Locations Pain Location: Pain in Ulcers With Dressing Change: No Duration of the Pain. Constant / Intermittento Constant Rate the pain. Current Pain Level: 7 Worst Pain Level: 8 Least Pain Level: 3 Tolerable Pain Level: 6 Character of Pain Describe the Pain: Aching Pain Management  and Medication Current Pain Management: Medication: Yes Is the Current Pain Management Adequate: Adequate How does your wound impact your activities of daily livingo Sleep: Yes Bathing: No Appetite: No Relationship With Others: No Bladder Continence: No Emotions: No Bowel Continence: No Work: No Toileting: No Drive: No Dressing: No Hobbies: No Engineer, maintenance) Signed: 08/23/2019 5:28:52 PM By: Baruch Gouty RN, BSN Entered By: Baruch Gouty on 08/23/2019 14:03:37 -------------------------------------------------------------------------------- Patient/Caregiver Education Details Patient Name: Date of Service: Karlton Lemon 6/22/2021andnbsp1:30 PM Medical Record Number: [id number] Patient Account Number: [id number] Date of Birth/Gender: Treating RN: 04/24/1980 (39 y.o. Orvan Falconer Primary Care Physician: Jori Moll, Karsten Fells Other Clinician: Referring Physician: Treating Physician/Extender: Vella Kohler in Treatment: [phone] Education Assessment Education Provided To: Patient Education Topics Provided Smoking and Wound Healing: Methods: Explain/Verbal Responses: State content correctly Wound/Skin Impairment: Methods: Explain/Verbal Responses: State content correctly Electronic Signature(s) Signed: 08/24/2019 4:51:47 PM By: Carlene Coria RN Entered By: Carlene Coria on 08/23/2019 14:34:34 -------------------------------------------------------------------------------- Wound Assessment Details Patient Name: Date of Service: AMBRIANA, SELWAY A. 08/23/2019 1:30 PM Medical Record Number: [id number] Patient Account Number:  [id number] Date of Birth/Sex: Treating RN: 1981-02-14 (39 y.o. [name], [name] Primary Care Sosie Gato: Jori Moll, Karsten Fells Other Clinician: Referring Darick Fetters: Treating Hassel Uphoff/Extender: Yevonne Aline, [name] Weeks in Treatment: 164 Wound Status Wound Number: 1 Primary Etiology: Pressure Ulcer Wound  Location: Medial Sacrum Wound Status: Open Wounding Event: Pressure Injury Comorbid History: Anemia, Osteomyelitis Date Acquired: 05/15/2016 Weeks Of Treatment: 164 Clustered Wound: No Photos Wound Measurements Length: (cm) 3.8 Width: (cm) 3.5 Depth: (cm) 3.6 Area: (cm) 10.446 Volume: (cm) 37.605 % Reduction in Area: 68.2% % Reduction in Volume: 74% Epithelialization: Medium (34-66%) Tunneling: No Undermining: Yes Starting Position (o'clock): 12 Ending Position (o'clock): 6 Maximum Distance: (cm) 2 Wound Description Classification: Category/Stage IV Wound Margin: Epibole Exudate Amount: Medium Exudate Type: Serosanguineous Exudate Color: red, brown Foul Odor After Cleansing: No Slough/Fibrino Yes Wound Bed Granulation Amount: Large (67-100%) Exposed Structure Granulation Quality: Red Fascia Exposed: No Necrotic Amount: Small (1-33%) Fat Layer (Subcutaneous Tissue) Exposed: Yes Necrotic Quality: Adherent Slough Tendon Exposed: No Muscle Exposed: No Joint Exposed: No Bone Exposed: No Treatment Notes Wound #1 (Medial Sacrum) 1. Cleanse With Wound Cleanser 2. Periwound Care Skin Prep 3. Primary Dressing Applied Calcium Alginate Ag 4. Secondary Dressing ABD Pad Dry Gauze 5. Secured With Recruitment consultant) Signed: 08/25/2019 5:20:52 PM By: Baruch Gouty RN, BSN Signed: 08/26/2019 5:12:49 PM By: Minerva Fester Previous Signature: 08/23/2019 5:28:52 PM Version By: Baruch Gouty RN, BSN Entered By: Minerva Fester on 08/25/2019 15:05:59 -------------------------------------------------------------------------------- Vitals Details Patient Name: Date of Service: [name] 08/23/2019 1:30 PM Medical Record Number: [id number] Patient Account Number: [id number] Date of Birth/Sex: Treating RN: 1980-10-09 (39 y.o. [name], [name] Primary Care Montell Leopard: Jori Moll, Karsten Fells Other Clinician: Referring Lillyth Spong: Treating Lekeya Rollings/Extender: Yevonne Aline, [name] Weeks in Treatment: 164 Vital Signs Time Taken: 13:55 Temperature (F): 97.9 Height (in): 65 Pulse (bpm): 83 Source: Stated Respiratory Rate (breaths/min): 18 Weight (lbs): 201 Blood Pressure (mmHg): 130/87 Source: Stated Reference Range: 80 - 120 mg / dl Body Mass Index (BMI): 33.4 Electronic Signature(s) Signed: 08/23/2019 5:28:52 PM By: Baruch Gouty RN, BSN Entered By: Baruch Gouty on 08/23/2019 13:58:01

## 2019-08-28 ENCOUNTER — Encounter (HOSPITAL_COMMUNITY): Payer: Self-pay | Admitting: *Deleted

## 2019-08-28 ENCOUNTER — Emergency Department (HOSPITAL_COMMUNITY)
Admission: EM | Admit: 2019-08-28 | Discharge: 2019-08-29 | Disposition: A | Discharge status: Home / Self Care | Payer: Medicaid Other | Attending: Emergency Medicine | Admitting: Emergency Medicine

## 2019-08-28 ENCOUNTER — Other Ambulatory Visit: Payer: Self-pay

## 2019-08-28 ENCOUNTER — Emergency Department (HOSPITAL_COMMUNITY): Payer: Medicaid Other

## 2019-08-28 DIAGNOSIS — M62838 Other muscle spasm: Secondary | ICD-10-CM | POA: Insufficient documentation

## 2019-08-28 DIAGNOSIS — T83028A Displacement of other indwelling urethral catheter, initial encounter: Secondary | ICD-10-CM | POA: Insufficient documentation

## 2019-08-28 DIAGNOSIS — R109 Unspecified abdominal pain: Secondary | ICD-10-CM | POA: Diagnosis not present

## 2019-08-28 DIAGNOSIS — T83021A Displacement of indwelling urethral catheter, initial encounter: Secondary | ICD-10-CM

## 2019-08-28 DIAGNOSIS — E039 Hypothyroidism, unspecified: Secondary | ICD-10-CM | POA: Insufficient documentation

## 2019-08-28 DIAGNOSIS — F17210 Nicotine dependence, cigarettes, uncomplicated: Secondary | ICD-10-CM | POA: Insufficient documentation

## 2019-08-28 LAB — URINALYSIS, ROUTINE W REFLEX MICROSCOPIC
Bilirubin Urine: NEGATIVE
Glucose, UA: NEGATIVE mg/dL
Hgb urine dipstick: NEGATIVE
Ketones, ur: NEGATIVE mg/dL
Nitrite: NEGATIVE
Protein, ur: >=300 mg/dL — AB
RBC / HPF: >50 RBC/hpf — ABNORMAL HIGH (ref 0–5)
Specific Gravity, Urine: 1.028 (ref 1.005–1.030)
WBC, UA: >50 WBC/hpf — ABNORMAL HIGH (ref 0–5)
pH: 5 (ref 5.0–8.0)

## 2019-08-28 MED ORDER — CEPHALEXIN 500 MG PO CAPS
500.0000 mg | ORAL_CAPSULE | Freq: Four times a day (QID) | ORAL | 0 refills | Status: DC
Start: 2019-08-28 — End: 2019-08-30

## 2019-08-28 MED ORDER — CEPHALEXIN 500 MG PO CAPS
500.0000 mg | ORAL_CAPSULE | Freq: Once | ORAL | Status: AC
  Administered 2019-08-28: 500 mg via ORAL
  Filled 2019-08-28: qty 1

## 2019-08-28 MED ORDER — OXYCODONE-ACETAMINOPHEN 5-325 MG PO TABS
1.0000 | ORAL_TABLET | Freq: Once | ORAL | Status: AC
  Administered 2019-08-28: 1 via ORAL
  Filled 2019-08-28: qty 1

## 2019-08-28 NOTE — ED Triage Notes (Signed)
Pt brought in by [facility] for c/o foley catheter came out; pt c/o pain to left flank x 2 days; pt was seen here x 2 weeks ago for catheter coming out; pt states she has blue urine due to having an ecoli infection

## 2019-08-28 NOTE — Discharge Instructions (Addendum)
Your CT scan tonight did not show evidence of a kidney stone.  I have written a prescription for Keflex, it is important that you take the antibiotic as directed until its finished.  Follow-up with your primary care provider this week for recheck.

## 2019-08-29 MED ORDER — OXYCODONE-ACETAMINOPHEN 5-325 MG PO TABS
1.0000 | ORAL_TABLET | Freq: Once | ORAL | Status: AC
  Administered 2019-08-29: 1 via ORAL
  Filled 2019-08-29: qty 1
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
## 2019-08-30 ENCOUNTER — Encounter: Payer: Self-pay | Admitting: Family Medicine

## 2019-08-30 ENCOUNTER — Ambulatory Visit (INDEPENDENT_AMBULATORY_CARE_PROVIDER_SITE_OTHER): Payer: Medicaid Other | Admitting: Family Medicine

## 2019-08-30 ENCOUNTER — Other Ambulatory Visit: Payer: Self-pay

## 2019-08-30 VITALS — BP 138/96 | HR 92 | Temp 98.3°F

## 2019-08-30 DIAGNOSIS — T839XXD Unspecified complication of genitourinary prosthetic device, implant and graft, subsequent encounter: Secondary | ICD-10-CM | POA: Diagnosis not present

## 2019-08-30 DIAGNOSIS — F411 Generalized anxiety disorder: Secondary | ICD-10-CM | POA: Diagnosis not present

## 2019-08-30 DIAGNOSIS — F410 Panic disorder [episodic paroxysmal anxiety] without agoraphobia: Secondary | ICD-10-CM

## 2019-08-30 DIAGNOSIS — Z720 Tobacco use: Secondary | ICD-10-CM | POA: Diagnosis not present

## 2019-08-30 DIAGNOSIS — G894 Chronic pain syndrome: Secondary | ICD-10-CM

## 2019-08-30 DIAGNOSIS — N390 Urinary tract infection, site not specified: Secondary | ICD-10-CM

## 2019-08-30 MED ORDER — VARENICLINE TARTRATE 1 MG PO TABS: 1.0000 mg | ORAL_TABLET | Freq: Two times a day (BID) | ORAL | 1 refills | Status: DC

## 2019-08-30 MED ORDER — ALPRAZOLAM 0.25 MG PO TABS: 0.2500 mg | ORAL_TABLET | Freq: Two times a day (BID) | ORAL | 1 refills | Status: DC | PRN

## 2019-08-30 MED ORDER — KETOROLAC TROMETHAMINE 60 MG/2ML IM SOLN
60.0000 mg | Freq: Once | INTRAMUSCULAR | Status: AC
Start: 2019-08-30 — End: 2019-08-30
  Administered 2019-08-30: 60 mg via INTRAMUSCULAR

## 2019-08-30 MED ORDER — CHANTIX STARTING MONTH PAK 0.5 MG X 11 & 1 MG X 42 PO TABS: ORAL_TABLET | ORAL | 0 refills | Status: DC

## 2019-08-30 MED ORDER — BUSPIRONE HCL 7.5 MG PO TABS: 7.5000 mg | ORAL_TABLET | Freq: Two times a day (BID) | ORAL | 2 refills | Status: DC
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
## 2019-08-30 NOTE — Patient Instructions (Addendum)
[facility] [place] - Dr. Nehemiah Settle    Varenicline oral tablets What is this medicine? VARENICLINE (var e NI kleen) is used to help people quit smoking. It is used with a patient support program recommended by your physician. This medicine may be used for other purposes; ask your health care provider or pharmacist if you have questions. COMMON BRAND NAME(S): Chantix What should I tell my health care provider before I take this medicine? They need to know if you have any of these conditions:  heart disease  if you often drink alcohol  kidney disease  mental illness  on hemodialysis  seizures  history of stroke  suicidal thoughts, plans, or attempt; a previous suicide attempt by you or a family member  an unusual or allergic reaction to varenicline, other medicines, foods, dyes, or preservatives  pregnant or trying to get pregnant  breast-feeding How should I use this medicine? Take this medicine by mouth after eating. Take with a full glass of water. Follow the directions on the prescription label. Take your doses at regular intervals. Do not take your medicine more often than directed. There are 3 ways you can use this medicine to help you quit smoking; talk to your health care professional to decide which plan is right for you: 1) you can choose a quit date and start this medicine 1 week before the quit date, or, 2) you can start taking this medicine before you choose a quit date, and then pick a quit date between day 8 and 35 days of treatment, or, 3) if you are not sure that you are able or willing to quit smoking right away, start taking this medicine and slowly decrease the amount you smoke as directed by your health care professional with the goal of being cigarette-free by week 12 of treatment. Stick to your plan; ask about support groups or other ways to help you remain cigarette-free. If you are motivated to quit smoking and did not succeed during a  previous attempt with this medicine for reasons other than side effects, or if you returned to smoking after this treatment, speak with your health care professional about whether another course of this medicine may be right for you. A special MedGuide will be given to you by the pharmacist with each prescription and refill. Be sure to read this information carefully each time. Talk to your pediatrician regarding the use of this medicine in children. This medicine is not approved for use in children. Overdosage: If you think you have taken too much of this medicine contact a poison control center or emergency room at once. NOTE: This medicine is only for you. Do not share this medicine with others. What if I miss a dose? If you miss a dose, take it as soon as you can. If it is almost time for your next dose, take only that dose. Do not take double or extra doses. What may interact with this medicine?  alcohol  insulin  other medicines used to help people quit smoking  theophylline  warfarin This list may not describe all possible interactions. Give your health care provider a list of all the medicines, herbs, non-prescription drugs, or dietary supplements you use. Also tell them if you smoke, drink alcohol, or use illegal drugs. Some items may interact with your medicine. What should I watch for while using this medicine? It is okay if you do not succeed at your attempt to quit and have a cigarette. You  can still continue your quit attempt and keep using this medicine as directed. Just throw away your cigarettes and get back to your quit plan. Talk to your health care provider before using other treatments to quit smoking. Using this medicine with other treatments to quit smoking may increase the risk for side effects compared to using a treatment alone. You may get drowsy or dizzy. Do not drive, use machinery, or do anything that needs mental alertness until you know how this medicine affects  you. Do not stand or sit up quickly, especially if you are an older patient. This reduces the risk of dizzy or fainting spells. Decrease the number of alcoholic beverages that you drink during treatment with this medicine until you know if this medicine affects your ability to tolerate alcohol. Some people have experienced increased drunkenness (intoxication), unusual or sometimes aggressive behavior, or no memory of things that have happened (amnesia) during treatment with this medicine. Sleepwalking can happen during treatment with this medicine, and can sometimes lead to behavior that is harmful to you, other people, or property. Stop taking this medicine and tell your doctor if you start sleepwalking or have other unusual sleep-related activity. After taking this medicine, you may get up out of bed and do an activity that you do not know you are doing. The next morning, you may have no memory of this. Activities include driving a car ("sleep-driving"), making and eating food, talking on the phone, sexual activity, and sleep-walking. Serious injuries have occurred. Stop the medicine and call your doctor right away if you find out you have done any of these activities. Do not take this medicine if you have used alcohol that evening. Do not take it if you have taken another medicine for sleep. The risk of doing these sleep-related activities is higher. Patients and their families should watch out for new or worsening depression or thoughts of suicide. Also watch out for sudden changes in feelings such as feeling anxious, agitated, panicky, irritable, hostile, aggressive, impulsive, severely restless, overly excited and hyperactive, or not being able to sleep. If this happens, call your health care professional. If you have diabetes and you quit smoking, the effects of insulin may be increased and you may need to reduce your insulin dose. Check with your doctor or health care professional about how you should  adjust your insulin dose. What side effects may I notice from receiving this medicine? Side effects that you should report to your doctor or health care professional as soon as possible:  allergic reactions like skin rash, itching or hives, swelling of the face, lips, tongue, or throat  acting aggressive, being angry or violent, or acting on dangerous impulses  breathing problems  changes in emotions or moods  chest pain or chest tightness  feeling faint or lightheaded, falls  hallucination, loss of contact with reality  mouth sores  redness, blistering, peeling or loosening of the skin, including inside the mouth  signs and symptoms of a stroke like changes in vision; confusion; trouble speaking or understanding; severe headaches; sudden numbness or weakness of the face, arm or leg; trouble walking; dizziness; loss of balance or coordination  seizures  sleepwalking  suicidal thoughts or other mood changes Side effects that usually do not require medical attention (report to your doctor or health care professional if they continue or are bothersome):  constipation  gas  headache  nausea, vomiting  strange dreams  trouble sleeping This list may not describe all possible side effects.  Call your doctor for medical advice about side effects. You may report side effects to FDA at 1-800-FDA-1088. Where should I keep my medicine? Keep out of the reach of children. Store at room temperature between 15 and 30 degrees C (59 and 86 degrees F). Throw away any unused medicine after the expiration date. NOTE: This sheet is a summary. It may not cover all possible information. If you have questions about this medicine, talk to your doctor, pharmacist, or health care provider.  2020 Elsevier/Gold Standard (2018-02-05 14:27:36)

## 2019-08-31 ENCOUNTER — Other Ambulatory Visit: Payer: Self-pay | Admitting: Family Medicine

## 2019-08-31 DIAGNOSIS — M62838 Other muscle spasm: Secondary | ICD-10-CM

## 2019-08-31 LAB — URINE CULTURE: Culture: >=100000 — AB

## 2019-08-31 NOTE — Telephone Encounter (Signed)
Tizanidine last rx'd 06/29/19 #120 with 2 refills Last OV 08/30/19 Next OV 10/07/19  Ok to refill?

## 2019-09-01 ENCOUNTER — Telehealth: Payer: Self-pay | Admitting: *Deleted

## 2019-09-01 NOTE — Telephone Encounter (Signed)
Post ED Visit - Positive Culture Follow-up: Unsuccessful Patient Follow-up  Culture assessed and recommendations reviewed by:  []  Elenor Quinones, Pharm.D. []  Heide Guile, Pharm.D., BCPS AQ-ID []  Parks Neptune, Pharm.D., BCPS []  Alycia Rossetti, Pharm.D., BCPS []  Walker, Pharm.D., BCPS, AAHIVP []  Legrand Como, Pharm.D., BCPS, AAHIVP []  Wynell Balloon, PharmD []  Vincenza Hews, PharmD, BCPS  Positive urine culture, reviewed by Arturo Morton, Pharm D  []  Patient discharged without antimicrobial prescription and treatment is now indicated [x]  Organism is resistant to prescribed ED discharge antimicrobial []  Patient with positive blood cultures  Plan: Symptom check, if symptoms persistenting return to ED for IV antibiotics.  If no symptoms, no further treatment indicated Unable to contact patient after 3 attempts, letter will be sent to address on file  Ardeen Fillers 09/01/2019, 12:25 PM

## 2019-09-01 NOTE — Progress Notes (Signed)
ED Antimicrobial Stewardship Positive Culture Follow Up   [name] is an 39 y.o. female who presented to [facility] on 08/28/2019 with a chief complaint of  Chief Complaint  Patient presents with   Flank Pain    Recent Results (from the past 720 hour(s))  Urine culture     Status: Abnormal   Collection Time: 08/28/19 10:38 PM   Specimen: Urine, Clean Catch  Result Value Ref Range Status   Specimen Description   Final    URINE, CLEAN CATCH Performed at [facility], [address] [place], [place] [address]    Special Requests   Final    NONE Performed at [facility], [address] [place],  [address]    Culture (A)  Final    >=100,000 COLONIES/mL ESCHERICHIA COLI Confirmed Extended Spectrum Beta-Lactamase Producer (ESBL).  In bloodstream infections from ESBL organisms, carbapenems are preferred over piperacillin/tazobactam. They are shown to have a lower risk of mortality.    Report Status 08/31/2019 FINAL  Final   Organism ID, Bacteria ESCHERICHIA COLI (A)  Final      Susceptibility   Escherichia coli - MIC*    AMPICILLIN >=32 RESISTANT Resistant     CEFAZOLIN >=64 RESISTANT Resistant     CEFTRIAXONE >=64 RESISTANT Resistant     CIPROFLOXACIN >=4 RESISTANT Resistant     GENTAMICIN <=1 SENSITIVE Sensitive     IMIPENEM <=0.25 SENSITIVE Sensitive     NITROFURANTOIN <=16 SENSITIVE Sensitive     TRIMETH/SULFA >=320 RESISTANT Resistant     AMPICILLIN/SULBACTAM >=32 RESISTANT Resistant     PIP/TAZO 8 SENSITIVE Sensitive     * >=100,000 COLONIES/mL ESCHERICHIA COLI    [x]  Treated with Keflex, organism resistant to prescribed antimicrobial  New antibiotic prescription: Flow Manager to call patient for symptom check. If patient feeling better with no further symptoms (no urinary symptoms, flank pain, fever, etc.), then no further treatment indicated. If patient still experiencing symptoms, then patient should return to the ER for IV antibiotics.  ED Provider:  Pati Gallo, PA-C   Margretta Sidle Dohlen 09/01/2019, 9:56 AM Clinical Pharmacist Monday - Friday phone -  [phone] Saturday - Sunday phone - [phone]

## 2019-09-05 ENCOUNTER — Telehealth: Payer: Self-pay | Admitting: Family Medicine

## 2019-09-05 ENCOUNTER — Encounter: Payer: Self-pay | Admitting: Family Medicine

## 2019-09-05 DIAGNOSIS — N3000 Acute cystitis without hematuria: Secondary | ICD-10-CM

## 2019-09-05 MED ORDER — NITROFURANTOIN MONOHYD MACRO 100 MG PO CAPS: 100.0000 mg | ORAL_CAPSULE | Freq: Two times a day (BID) | ORAL | 0 refills | Status: AC

## 2019-09-05 NOTE — Telephone Encounter (Signed)
Please let patient know I have reviewed her urine culture results from the ER and the medication she is currently taking is not going to work.  The bacteria is resistant to many antibiotics.  The only oral antibiotic listed that it is sensitive to is Macrobid.  I know she thinks this does not work for her but I am sending it in as our only other option is IV antibiotics, which we are unable to do in the office.

## 2019-09-06 NOTE — Telephone Encounter (Signed)
Pt aware of provider feedback and voiced understanding. 

## 2019-09-13 ENCOUNTER — Encounter (HOSPITAL_BASED_OUTPATIENT_CLINIC_OR_DEPARTMENT_OTHER): Payer: Medicaid Other | Attending: Internal Medicine | Admitting: Internal Medicine

## 2019-09-13 DIAGNOSIS — G8222 Paraplegia, incomplete: Secondary | ICD-10-CM | POA: Diagnosis not present

## 2019-09-13 DIAGNOSIS — L89154 Pressure ulcer of sacral region, stage 4: Secondary | ICD-10-CM | POA: Insufficient documentation

## 2019-09-13 DIAGNOSIS — L89613 Pressure ulcer of right heel, stage 3: Secondary | ICD-10-CM | POA: Insufficient documentation

## 2019-09-13 DIAGNOSIS — I10 Essential (primary) hypertension: Secondary | ICD-10-CM | POA: Diagnosis not present

## 2019-09-13 NOTE — Progress Notes (Addendum)
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 Vitals Time Taken: 2:37 PM, Height: 65 in, Weight: 201 lbs, BMI: 33.4, Temperature: 98.0 F, Pulse: 108 bpm, Respiratory Rate: 18 breaths/min, Blood Pressure: 139/96 mmHg. Respiratory work of breathing is normal. Psychiatric appears at normal baseline. General Notes: Wound exam; large postsurgical wound this is come in nicely. Depth is better. Granulation looks healthy there is  no exposed bone. There is undermining. Integumentary (Hair, Skin) Wound #1 status is Open. Original cause of wound was Pressure Injury. The wound is located on the Medial Sacrum. The wound measures 3.7cm length x 2.5cm width x 3cm depth; 7.265cm^2 area and 21.795cm^3 volume. There is Fat Layer (Subcutaneous Tissue) Exposed exposed. There is undermining starting at 1:00 and ending at 5:00 with a maximum distance of 2.1cm. There is a medium amount of serosanguineous drainage noted. The wound margin is epibole. There is medium (34-66%) red granulation within the wound bed. There is a medium (34-66%) amount of necrotic tissue within the wound bed including Adherent Slough. Assessment Active Problems ICD-10 Pressure ulcer of sacral region, stage 4 Paraplegia, incomplete Plan Follow-up Appointments: Return appointment in 1 month. Dressing Change Frequency: Change dressing every day. Wound Cleansing: May shower and wash wound with soap and water. Primary Wound Dressing: Calcium Alginate with Silver Secondary Dressing: Dry Gauze ABD pad - secure with tape 1. Really continue with use silver alginate with backing dry gauze to fill in the dead space 2. Because  of her status of Medicaid there really are not any options here. I think with careful attention of skilled nurses this could be wound VAC however I do not believe we have access to home health. Were 3 fortunately were making progress with really basic dressings Electronic Signature(s) Signed: 09/13/2019 5:16:39 PM By: Linton Ham MD Entered By: Linton Ham on 09/13/2019 15:43:53 -------------------------------------------------------------------------------- SuperBill Details Patient Name: Date of Service: [name] 09/13/2019 Medical Record Number: [id number] Patient Account Number: [id number] Date of Birth/Sex: Treating RN: 1981-01-27 (39 y.o. Orvan Falconer Primary Care Provider: Jori Moll, Karsten Fells Other  Clinician: Referring Provider: Treating Provider/Extender: Yevonne Aline, [name] Weeks in Treatment: 167 Diagnosis Coding ICD-10 Codes Code Description L89.154 Pressure ulcer of sacral region, stage 4 G82.22 Paraplegia, incomplete L89.613 Pressure ulcer of right heel, stage 3 Facility Procedures CPT4 Code: [id number] Description: 99213 - WOUND CARE VISIT-LEV 3 EST PT Modifier: Quantity: 1 Physician Procedures : CPT4 Code Description Modifier [id number] - WC PHYS LEVEL 3 - EST PT ICD-10 Diagnosis Description L89.154 Pressure ulcer of sacral region, stage 4 G82.22 Paraplegia, incomplete Quantity: 1 Electronic Signature(s) Signed: 09/13/2019 5:16:39 PM By: Linton Ham MD Entered By: Linton Ham on 09/13/2019 15:44:33

## 2019-09-15 NOTE — Progress Notes (Signed)
ANAGHA, LOSEKE ([id number]) Visit Report for 09/13/2019 Arrival Information Details Patient Name: Date of Service: NALDA, SHACKLEFORD 09/13/2019 1:30 PM Medical Record Number: [id number] Patient Account Number: [id number] Date of Birth/Sex: Treating RN: 10/22/1980 (39 y.o. Orvan Falconer Primary Care Carrye Goller: Jori Moll, Karsten Fells Other Clinician: Referring Nijae Doyel: Treating Moni Rothrock/Extender: Yevonne Aline, [name] Weeks in Treatment: 95 Visit Information History Since Last Visit Added or deleted any medications: No Patient Arrived: Wheel Chair Any new allergies or adverse reactions: No Arrival Time: 14:37 Had a fall or experienced change in No Accompanied By: caregiver activities of daily living that may affect Transfer Assistance: Harrel Lemon Lift risk of falls: Patient Identification Verified: Yes Signs or symptoms of abuse/neglect since last visito No Secondary Verification Process Completed: Yes Hospitalized since last visit: No Patient Requires Transmission-Based Precautions: No Implantable device outside of the clinic excluding No Patient Has Alerts: No cellular tissue based products placed in the center since last visit: Has Dressing in Place as Prescribed: Yes Pain Present Now: Yes Electronic Signature(s) Signed: 09/15/2019 10:46:54 AM By: Sandre Kitty Entered By: Sandre Kitty on 09/13/2019 14:37:48 -------------------------------------------------------------------------------- Clinic Level of Care Assessment Details Patient Name: Date of Service: SHATONIA, HOOTS 09/13/2019 1:30 PM Medical Record Number: [id number] Patient Account Number: [id number] Date of Birth/Sex: Treating RN: Nov 13, 1980 (39 y.o. Orvan Falconer Primary Care Asuna Peth: Jori Moll, Karsten Fells Other Clinician: Referring Gustin Zobrist: Treating Aarionna Germer/Extender: Yevonne Aline, [name] Weeks in Treatment: [facility] Level of Care Assessment Items TOOL 4 Quantity Score X- 1  0 Use when only an EandM is performed on FOLLOW-UP visit ASSESSMENTS - Nursing Assessment / Reassessment X- 1 10 Reassessment of Co-morbidities (includes updates in patient status) X- 1 5 Reassessment of Adherence to Treatment Plan ASSESSMENTS - Wound and Skin A ssessment / Reassessment X - Simple Wound Assessment / Reassessment - one wound 1 5 _0  - 0 Complex Wound Assessment / Reassessment - multiple wounds _1  - 0 Dermatologic / Skin Assessment (not related to wound area) ASSESSMENTS - Focused Assessment _2  - 0 Circumferential Edema Measurements - multi extremities _3  - 0 Nutritional Assessment / Counseling / Intervention _4  - 0 Lower Extremity Assessment (monofilament, tuning fork, pulses) _5  - 0 Peripheral Arterial Disease Assessment (using hand held doppler) ASSESSMENTS - Ostomy and/or Continence Assessment and Care _6  - 0 Incontinence Assessment and Management _7  - 0 Ostomy Care Assessment and Management (repouching, etc.) PROCESS - Coordination of Care X - Simple Patient / Family Education for ongoing care 1 15 _8  - 0 Complex (extensive) Patient / Family Education for ongoing care X- 1 10 Staff obtains Programmer, systems, Records, T Results / Process Orders est _9  - 0 Staff telephones HHA, Nursing Homes / Clarify orders / etc _10  - 0 Routine Transfer to another Facility (non-emergent condition) _11  - 0 Routine Hospital Admission (non-emergent condition) _12  - 0 New Admissions / Biomedical engineer / Ordering NPWT Apligraf, etc. , _13  - 0 Emergency Hospital Admission (emergent condition) X- 1 10 Simple Discharge Coordination _14  - 0 Complex (extensive) Discharge Coordination PROCESS - Special Needs _15  - 0 Pediatric / Minor Patient Management _16  - 0 Isolation Patient Management _17  - 0 Hearing / Language / Visual special needs _18  - 0 Assessment of Community assistance (transportation, D/C planning, etc.) _19  - 0 Additional assistance / Altered mentation _20  -  0 Support Surface(s) Assessment (bed, cushion, seat, etc.) INTERVENTIONS - Wound Cleansing / Measurement X - Simple Wound Cleansing - one wound 1 5 _21  - 0 Complex Wound  Cleansing - multiple wounds X- 1 5 Wound Imaging (photographs - any number of wounds) _0  - 0 Wound Tracing (instead of photographs) X- 1 5 Simple Wound Measurement - one wound _1  - 0 Complex Wound Measurement - multiple wounds INTERVENTIONS - Wound Dressings _2  - 0 Small Wound Dressing one or multiple wounds X- 1 15 Medium Wound Dressing one or multiple wounds _3  - 0 Large Wound Dressing one or multiple wounds X- 1 5 Application of Medications - topical <CHENIDPOEUMPNTIR>_4<\/ERXVQMGQQPYPPJKD>_3  - 0 Application of Medications - injection INTERVENTIONS - Miscellaneous _5  - 0 External ear exam _6  - 0 Specimen Collection (cultures, biopsies, blood, body fluids, etc.) _7  - 0 Specimen(s) / Culture(s) sent or taken to Lab for analysis _8  - 0 Patient Transfer (multiple staff / Civil Service fast streamer / Similar devices) _9  - 0 Simple Staple / Suture removal (25 or less) _10  - 0 Complex Staple / Suture removal (26 or more) _11  - 0 Hypo / Hyperglycemic Management (close monitor of Blood Glucose) _12  - 0 Ankle / Brachial Index (ABI) - do not check if billed separately X- 1 5 Vital Signs Has the patient been seen at the hospital within the last three years: Yes Total Score: 95 Level Of Care: New/Established - Level 3 Electronic Signature(s) Signed: 09/13/2019 5:57:23 PM By: Carlene Coria RN Entered By: Carlene Coria on 09/13/2019 15:37:57 -------------------------------------------------------------------------------- Encounter Discharge Information Details Patient Name: Date of Service: [name] 09/13/2019 1:30 PM Medical Record Number: [id number] Patient Account Number: [id number] Date of Birth/Sex: Treating RN: April 26, 1980 (39 y.o. Clearnce Sorrel Primary Care Soleil Mas: Jori Moll, Karsten Fells Other Clinician: Referring Wm Fruchter: Treating  Allaya Abbasi/Extender: Curlene Dolphin Weeks in Treatment: [phone] Encounter Discharge Information Items Discharge Condition: Stable Ambulatory Status: Wheelchair Discharge Destination: Home Transportation: Other Accompanied By: caregiver Schedule Follow-up Appointment: Yes Clinical Summary of Care: Patient Declined Electronic Signature(s) Signed: 09/13/2019 5:18:51 PM By: Kela Millin Entered By: Kela Millin on 09/13/2019 16:50:53 -------------------------------------------------------------------------------- Lower Extremity Assessment Details Patient Name: Date of Service: JALILAH, WILTSIE A. 09/13/2019 1:30 PM Medical Record Number: [id number] Patient Account Number: [id number] Date of Birth/Sex: Treating RN: [birth year] (39 y.o. Orvan Falconer Primary Care Shaquira Moroz: Jori Moll, Karsten Fells Other Clinician: Referring Lexie Morini: Treating Jannatul Wojdyla/Extender: Yevonne Aline, [name] Weeks in Treatment: 167 Electronic Signature(s) Signed: 09/13/2019 5:57:23 PM By: Carlene Coria RN Signed: 09/15/2019 10:46:54 AM By: Sandre Kitty Entered By: Sandre Kitty on 09/13/2019 14:39:45 -------------------------------------------------------------------------------- Multi Wound Chart Details Patient Name: Date of Service: [name] 09/13/2019 1:30 PM Medical Record Number: [id number] Patient Account Number: [id number] Date of Birth/Sex: Treating RN: 1980-07-10 (39 y.o. Orvan Falconer Primary Care Carrel Leather: Jori Moll, Karsten Fells Other Clinician: Referring Indonesia Mckeough: Treating Nathalie Cavendish/Extender: Yevonne Aline, [name] Weeks in Treatment: 167 Vital Signs Height(in): 65 Pulse(bpm): 108 Weight(lbs): 201 Blood Pressure(mmHg): 139/96 Body Mass Index(BMI): 33 Temperature(F): 98.0 Respiratory Rate(breaths/min): 18 Photos: [1:No Photos Medial Sacrum] [N/A:N/A N/A] Wound Location: [1:Pressure Injury] [N/A:N/A] Wounding Event: [1:Pressure Ulcer]  [N/A:N/A] Primary Etiology: [1:Anemia, Osteomyelitis] [N/A:N/A] Comorbid History: [1:05/15/2016] [N/A:N/A] Date Acquired: [9:024] [N/A:N/A] Weeks of Treatment: [1:Open] [N/A:N/A] Wound Status: [1:3.7x2.5x3] [N/A:N/A] Measurements L x W x D (cm) [1:7.265] [N/A:N/A] A (cm) : rea [1:21.795] [N/A:N/A] Volume (cm) : [1:77.90%] [N/A:N/A] % Reduction in A rea: [1:84.90%] [N/A:N/A] % Reduction in Volume: [1:1] Starting Position 1 (o'clock): [1:5] Ending Position 1 (o'clock): [1:2.1] Maximum Distance 1 (cm): [1:Yes] [N/A:N/A] Undermining: [1:Category/Stage IV] [N/A:N/A] Classification: [1:Medium] [N/A:N/A] Exudate A mount: [1:Serosanguineous] [N/A:N/A] Exudate Type: [1:red, brown] [N/A:N/A] Exudate Color: [1:Epibole] [  N/A:N/A] Wound Margin: [1:Medium (34-66%)] [N/A:N/A] Granulation A mount: [1:Red] [N/A:N/A] Granulation Quality: [1:Medium (34-66%)] [N/A:N/A] Necrotic A mount: [1:Fat Layer (Subcutaneous Tissue)] [N/A:N/A] Exposed Structures: [1:Exposed: Yes Fascia: No Tendon: No Muscle: No Joint: No Bone: No Medium (34-66%)] [N/A:N/A] Treatment Notes Electronic Signature(s) Signed: 09/13/2019 5:16:39 PM By: Linton Ham MD Signed: 09/13/2019 5:57:23 PM By: Carlene Coria RN Entered By: Linton Ham on 09/13/2019 15:41:03 -------------------------------------------------------------------------------- Multi-Disciplinary Care Plan Details Patient Name: Date of Service: [name] 09/13/2019 1:30 PM Medical Record Number: [id number] Patient Account Number: [id number] Date of Birth/Sex: Treating RN: 07-24-1980 (39 y.o. Orvan Falconer Primary Care Lamaria Hildebrandt: Jori Moll, Karsten Fells Other Clinician: Referring Kellis Mcadam: Treating Akemi Overholser/Extender: Yevonne Aline, [name] Weeks in Treatment: 167 Active Inactive Wound/Skin Impairment Nursing Diagnoses: Impaired tissue integrity Knowledge deficit related to smoking impact on wound healing Knowledge deficit related to  ulceration/compromised skin integrity Goals: Patient will demonstrate a reduced rate of smoking or cessation of smoking Date Initiated: 06/27/2016 Date Inactivated: 01/26/2017 Target Resolution Date: 01/08/2017 Goal Status: Unmet Unmet Reason: pt continues to smoke Patient/caregiver will verbalize understanding of skin care regimen Date Initiated: 06/27/2016 Target Resolution Date: 09/19/2019 Goal Status: Active Ulcer/skin breakdown will have a volume reduction of 50% by week 8 Date Initiated: 06/27/2016 Date Inactivated: 12/11/2016 Target Resolution Date: 07/25/2016 Goal Status: Met Interventions: Assess patient/caregiver ability to obtain necessary supplies Assess patient/caregiver ability to perform ulcer/skin care regimen upon admission and as needed Assess ulceration(s) every visit Provide education on smoking Provide education on ulcer and skin care Treatment Activities: Skin care regimen initiated : 06/27/2016 Topical wound management initiated : 06/27/2016 Notes: Electronic Signature(s) Signed: 09/13/2019 5:57:23 PM By: Carlene Coria RN Entered By: Carlene Coria on 09/13/2019 13:36:59 -------------------------------------------------------------------------------- Pain Assessment Details Patient Name: Date of Service: EVERLIE, EBLE A. 09/13/2019 1:30 PM Medical Record Number: [id number] Patient Account Number: [id number] Date of Birth/Sex: Treating RN: [birth year] (39 y.o. Orvan Falconer Primary Care Leighton Brickley: Jori Moll, Karsten Fells Other Clinician: Referring Mendy Lapinsky: Treating Kutler Vanvranken/Extender: Yevonne Aline, [name] Weeks in Treatment: [phone] Active Problems Location of Pain Severity and Description of Pain Patient Has Paino Yes Site Locations Pain Location: Pain in Ulcers With Dressing Change: Yes Duration of the Pain. Constant / Intermittento Constant Rate the pain. Current Pain Level: 7 Worst Pain Level: 7 Least Pain Level: 3 Character of Pain Describe  the Pain: Burning, Throbbing Pain Management and Medication Current Pain Management: Medication: Yes Is the Current Pain Management Adequate: Adequate How does your wound impact your activities of daily livingo Sleep: Yes Bathing: No Appetite: No Relationship With Others: No Bladder Continence: No Emotions: Yes Bowel Continence: No Work: No Toileting: No Drive: No Dressing: No Hobbies: No Electronic Signature(s) Signed: 09/13/2019 5:34:53 PM By: Baruch Gouty RN, BSN Signed: 09/13/2019 5:57:23 PM By: Carlene Coria RN Entered By: Baruch Gouty on 09/13/2019 14:47:09 -------------------------------------------------------------------------------- Patient/Caregiver Education Details Patient Name: Date of Service: Karlton Lemon 7/13/2021andnbsp1:30 PM Medical Record Number: [id number] Patient Account Number: [id number] Date of Birth/Gender: Treating RN: 03-16-1980 (39 y.o. Orvan Falconer Primary Care Physician: Jori Moll, Karsten Fells Other Clinician: Referring Physician: Treating Physician/Extender: Vella Kohler in Treatment: [phone] Education Assessment Education Provided To: Patient Education Topics Provided Wound/Skin Impairment: Methods: Explain/Verbal Responses: State content correctly Electronic Signature(s) Signed: 09/13/2019 5:57:23 PM By: Carlene Coria RN Entered By: Carlene Coria on 09/13/2019 13:37:15 -------------------------------------------------------------------------------- Wound Assessment Details Patient Name: Date of Service: CLEDA, IMEL A. 09/13/2019 1:30 PM Medical Record Number: [id number] Patient Account Number: [id number]  Date of Birth/Sex: Treating RN: 06-10-1980 (39 y.o. [name], [name] Primary Care Saphronia Ozdemir: Jori Moll, Karsten Fells Other Clinician: Referring Marigene Erler: Treating Chiyo Fay/Extender: Yevonne Aline, [name] Weeks in Treatment: 167 Wound Status Wound Number: 1 Primary Etiology: Pressure  Ulcer Wound Location: Medial Sacrum Wound Status: Open Wounding Event: Pressure Injury Comorbid History: Anemia, Osteomyelitis Date Acquired: 05/15/2016 Weeks Of Treatment: 167 Clustered Wound: No Wound Measurements Length: (cm) 3.7 Width: (cm) 2.5 Depth: (cm) 3 Area: (cm) 7.265 Volume: (cm) 21.795 % Reduction in Area: 77.9% % Reduction in Volume: 84.9% Epithelialization: Medium (34-66%) Undermining: Yes Starting Position (o'clock): 1 Ending Position (o'clock): 5 Maximum Distance: (cm) 2.1 Wound Description Classification: Category/Stage IV Wound Margin: Epibole Exudate Amount: Medium Exudate Type: Serosanguineous Exudate Color: red, brown Foul Odor After Cleansing: No Slough/Fibrino Yes Wound Bed Granulation Amount: Medium (34-66%) Exposed Structure Granulation Quality: Red Fascia Exposed: No Necrotic Amount: Medium (34-66%) Fat Layer (Subcutaneous Tissue) Exposed: Yes Necrotic Quality: Adherent Slough Tendon Exposed: No Muscle Exposed: No Joint Exposed: No Bone Exposed: No Treatment Notes Wound #1 (Medial Sacrum) 1. Cleanse With Wound Cleanser 2. Periwound Care Skin Prep 3. Primary Dressing Applied Calcium Alginate Ag 4. Secondary Dressing ABD Pad Dry Gauze 5. Secured With Recruitment consultant) Signed: 09/13/2019 5:34:53 PM By: Baruch Gouty RN, BSN Entered By: Baruch Gouty on 09/13/2019 14:52:18 -------------------------------------------------------------------------------- Vitals Details Patient Name: Date of Service: [name] 09/13/2019 1:30 PM Medical Record Number: [id number] Patient Account Number: [id number] Date of Birth/Sex: Treating RN: 07-18-1980 (39 y.o. Orvan Falconer Primary Care Alfio Loescher: Jori Moll, Karsten Fells Other Clinician: Referring Tarahji Ramthun: Treating Syleena Mchan/Extender: Yevonne Aline, [name] Weeks in Treatment: 167 Vital Signs Time Taken: 14:37 Temperature (F): 98.0 Height (in): 65 Pulse  (bpm): 108 Weight (lbs): 201 Respiratory Rate (breaths/min): 18 Body Mass Index (BMI): 33.4 Blood Pressure (mmHg): 139/96 Reference Range: 80 - 120 mg / dl Electronic Signature(s) Signed: 09/15/2019 10:46:54 AM By: Sandre Kitty Entered By: Sandre Kitty on 09/13/2019 14:38:06

## 2019-09-18 ENCOUNTER — Other Ambulatory Visit: Payer: Self-pay

## 2019-09-18 ENCOUNTER — Emergency Department (HOSPITAL_COMMUNITY): Payer: Medicaid Other

## 2019-09-18 ENCOUNTER — Encounter (HOSPITAL_COMMUNITY): Payer: Self-pay | Admitting: Student

## 2019-09-18 ENCOUNTER — Emergency Department (HOSPITAL_COMMUNITY)
Admission: EM | Admit: 2019-09-18 | Discharge: 2019-09-19 | Disposition: A | Discharge status: Home / Self Care | Payer: Medicaid Other | Attending: Emergency Medicine | Admitting: Emergency Medicine

## 2019-09-18 DIAGNOSIS — E039 Hypothyroidism, unspecified: Secondary | ICD-10-CM | POA: Diagnosis not present

## 2019-09-18 DIAGNOSIS — N3001 Acute cystitis with hematuria: Secondary | ICD-10-CM | POA: Diagnosis not present

## 2019-09-18 DIAGNOSIS — F17210 Nicotine dependence, cigarettes, uncomplicated: Secondary | ICD-10-CM | POA: Diagnosis not present

## 2019-09-18 DIAGNOSIS — Y732 Prosthetic and other implants, materials and accessory gastroenterology and urology devices associated with adverse incidents: Secondary | ICD-10-CM | POA: Diagnosis not present

## 2019-09-18 DIAGNOSIS — Z79899 Other long term (current) drug therapy: Secondary | ICD-10-CM | POA: Insufficient documentation

## 2019-09-18 DIAGNOSIS — T839XXA Unspecified complication of genitourinary prosthetic device, implant and graft, initial encounter: Secondary | ICD-10-CM | POA: Insufficient documentation

## 2019-09-18 LAB — URINALYSIS, ROUTINE W REFLEX MICROSCOPIC
Bilirubin Urine: NEGATIVE
Glucose, UA: NEGATIVE mg/dL
Ketones, ur: NEGATIVE mg/dL
Nitrite: NEGATIVE
Protein, ur: 100 mg/dL — AB
Specific Gravity, Urine: 1.003 — ABNORMAL LOW (ref 1.005–1.030)
pH: 7 (ref 5.0–8.0)

## 2019-09-18 MED ORDER — HYDROCODONE-ACETAMINOPHEN 5-325 MG PO TABS
1.0000 | ORAL_TABLET | Freq: Once | ORAL | Status: AC
  Administered 2019-09-18: 1 via ORAL
  Filled 2019-09-18: qty 1

## 2019-09-18 MED ORDER — NITROFURANTOIN MONOHYD MACRO 100 MG PO CAPS
100.0000 mg | ORAL_CAPSULE | Freq: Two times a day (BID) | ORAL | 0 refills | Status: DC
Start: 2019-09-18 — End: 2019-10-07
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
## 2019-09-18 NOTE — Discharge Instructions (Addendum)
You were seen in the emergency department today for a problem with your Foley catheter.  This was reinserted.  We are starting you on Macrobid, an antibiotic, to treat for urinary tract infection please take this as prescribed.  We have prescribed you new medication(s) today. Discuss the medications prescribed today with your pharmacist as they can have adverse effects and interactions with your other medicines including over the counter and prescribed medications. Seek medical evaluation if you start to experience new or abnormal symptoms after taking one of these medicines, seek care immediately if you start to experience difficulty breathing, feeling of your throat closing, facial swelling, or rash as these could be indications of a more serious allergic reaction  Please follow with your primary care provider within 3 days.  Return to the ER for new or worsening symptoms including but not limited to fever, vomiting, increased pain, problems with your catheter, or any other concerns.

## 2019-09-18 NOTE — ED Triage Notes (Signed)
Pt brought in by [facility] from home with c/o foley catheter coming out. Pt was placed on a recent antibiotic that she has now completed for UTI. Pt reports her doctor is considering putting her on preventative antibiotics due to her high reoccurrence of UTIs. Pt's foley catheter that came out today was a 69F with 30cc balloon per pt.  Pt also c/o productive cough x 2 days.   EMS placed IV line in right a/c and gave 130ml NS due to hypotension on the truck. BP now 105/55.

## 2019-09-20 LAB — URINE CULTURE: Culture: 80000 — AB

## 2019-09-21 ENCOUNTER — Other Ambulatory Visit: Payer: Self-pay | Admitting: Family Medicine

## 2019-09-21 ENCOUNTER — Telehealth: Payer: Self-pay | Admitting: Family Medicine

## 2019-09-21 DIAGNOSIS — F410 Panic disorder [episodic paroxysmal anxiety] without agoraphobia: Secondary | ICD-10-CM

## 2019-09-21 DIAGNOSIS — N3001 Acute cystitis with hematuria: Secondary | ICD-10-CM

## 2019-09-21 DIAGNOSIS — N390 Urinary tract infection, site not specified: Secondary | ICD-10-CM

## 2019-09-21 DIAGNOSIS — F411 Generalized anxiety disorder: Secondary | ICD-10-CM

## 2019-09-21 MED ORDER — CEPHALEXIN 250 MG PO CAPS: 250.0000 mg | ORAL_CAPSULE | Freq: Four times a day (QID) | ORAL | 0 refills | Status: AC

## 2019-09-21 MED ORDER — CEPHALEXIN 250 MG PO CAPS: 250.0000 mg | ORAL_CAPSULE | Freq: Every day | ORAL | 0 refills | Status: DC

## 2019-09-21 NOTE — Telephone Encounter (Signed)
Please let patient know I sent in two different prescriptions for cephalexin 250 mg.  The first she needs to take is 4 times a day for 14 days.  Once this is complete she can do one capsule/day to help prevent future UTIs.

## 2019-09-21 NOTE — Telephone Encounter (Signed)
Went to [facility] ER Sunday with UTi. They gave her Microbid and mother states that does not treat it. Wants [name] to look at labs from hospital and send over a different antibiotic [facility] [place]. Also wants preventive med for UTI. She has appointment with urologist but could not get in till the end of next month

## 2019-09-22 ENCOUNTER — Telehealth: Payer: Self-pay | Admitting: *Deleted

## 2019-09-22 NOTE — Telephone Encounter (Signed)
Patient aware and verbalized understanding. °

## 2019-09-22 NOTE — Telephone Encounter (Signed)
Post ED Visit - Positive Culture Follow-up  Culture report reviewed by antimicrobial stewardship pharmacist: [place] [facility] []  [address], Pharm.D. []  Heide Guile, Pharm.D., BCPS AQ-ID []  Parks Neptune, Pharm.D., BCPS []  Alycia Rossetti, Pharm.D., BCPS []  [place], Pharm.D., BCPS, AAHIVP []  Legrand Como, Pharm.D., BCPS, AAHIVP []  Salome Arnt, PharmD, BCPS []  Johnnette Gourd, PharmD, BCPS []  Hughes Better, PharmD, BCPS []  Leeroy Cha, PharmD []  Laqueta Linden, PharmD, BCPS []  Albertina Parr, PharmD  [place] [facility] []  Leodis Sias, PharmD []  Lindell Spar, PharmD []  Royetta Asal, PharmD []  Graylin Shiver, Rph []  Rema Fendt) Glennon Mac, PharmD []  Arlyn Dunning, PharmD []  Netta Cedars, PharmD []  Dia Sitter, PharmD []  Leone Haven, PharmD []  Gretta Arab, PharmD []  Theodis Shove, PharmD []  Peggyann Juba, PharmD []  Reuel Boom, PharmD   Positive urine culture Treated with Nitrofurantoin Monohyd Macro, organism sensitive to the same and no further patient follow-up is required at this time. Arturo Morton, PharmD  Harlon Flor Talley 09/22/2019, 10:53 AM

## 2019-09-22 NOTE — Telephone Encounter (Signed)
90 day supply requested from pharmacy Next appt 10/07/19 with PCP

## 2019-09-24 ENCOUNTER — Emergency Department (HOSPITAL_COMMUNITY)
Admission: EM | Admit: 2019-09-24 | Discharge: 2019-09-25 | Disposition: A | Discharge status: Home / Self Care | Payer: Medicaid Other | Attending: Emergency Medicine | Admitting: Emergency Medicine

## 2019-09-24 DIAGNOSIS — I10 Essential (primary) hypertension: Secondary | ICD-10-CM | POA: Diagnosis not present

## 2019-09-24 DIAGNOSIS — R1030 Lower abdominal pain, unspecified: Secondary | ICD-10-CM | POA: Diagnosis not present

## 2019-09-24 DIAGNOSIS — T839XXA Unspecified complication of genitourinary prosthetic device, implant and graft, initial encounter: Secondary | ICD-10-CM

## 2019-09-24 DIAGNOSIS — F17210 Nicotine dependence, cigarettes, uncomplicated: Secondary | ICD-10-CM | POA: Insufficient documentation

## 2019-09-24 DIAGNOSIS — T83028A Displacement of other indwelling urethral catheter, initial encounter: Secondary | ICD-10-CM | POA: Insufficient documentation

## 2019-09-24 DIAGNOSIS — E039 Hypothyroidism, unspecified: Secondary | ICD-10-CM | POA: Insufficient documentation

## 2019-09-24 NOTE — ED Triage Notes (Signed)
Pt arrived from home via EMS c/o of urine leaking following the dislodging of foley catheter. Pt reports at apprx 2100, urine began leaking from bladder, and foley catheter was no longing draining. Pt reports she had a 20Fr 30cc foley in place that was pulled out.

## 2019-09-25 ENCOUNTER — Other Ambulatory Visit: Payer: Self-pay

## 2019-09-25 ENCOUNTER — Encounter (HOSPITAL_COMMUNITY): Payer: Self-pay

## 2019-09-25 LAB — URINALYSIS, ROUTINE W REFLEX MICROSCOPIC
Bilirubin Urine: NEGATIVE
Glucose, UA: NEGATIVE mg/dL
Ketones, ur: NEGATIVE mg/dL
Nitrite: NEGATIVE
Protein, ur: NEGATIVE mg/dL
Specific Gravity, Urine: 1.002 — ABNORMAL LOW (ref 1.005–1.030)
pH: 7 (ref 5.0–8.0)

## 2019-09-25 MED ORDER — OXYCODONE-ACETAMINOPHEN 5-325 MG PO TABS
2.0000 | ORAL_TABLET | Freq: Once | ORAL | Status: AC
  Administered 2019-09-25: 2 via ORAL
  Filled 2019-09-25: qty 2

## 2019-09-25 NOTE — ED Notes (Signed)
ED Provider at bedside. 
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
## 2019-09-25 NOTE — Discharge Instructions (Signed)
You were seen in the emergency department today with your Foley catheter coming out.  Were able to put this back in and have sent your urine for culture.  Continue your home antibiotics and follow with your urologist at the scheduled time.  Return to the emergency department any new or suddenly worsening symptoms.

## 2019-09-26 LAB — URINE CULTURE: Culture: NO GROWTH

## 2019-10-07 ENCOUNTER — Encounter: Payer: Self-pay | Admitting: Family Medicine

## 2019-10-07 ENCOUNTER — Other Ambulatory Visit: Payer: Self-pay

## 2019-10-07 ENCOUNTER — Ambulatory Visit: Payer: Medicaid Other | Admitting: Family Medicine

## 2019-10-07 VITALS — BP 132/88 | HR 100 | Temp 98.1°F

## 2019-10-07 DIAGNOSIS — Z720 Tobacco use: Secondary | ICD-10-CM

## 2019-10-07 DIAGNOSIS — N390 Urinary tract infection, site not specified: Secondary | ICD-10-CM

## 2019-10-07 DIAGNOSIS — G894 Chronic pain syndrome: Secondary | ICD-10-CM | POA: Diagnosis not present

## 2019-10-07 DIAGNOSIS — F411 Generalized anxiety disorder: Secondary | ICD-10-CM | POA: Diagnosis not present

## 2019-10-07 DIAGNOSIS — F410 Panic disorder [episodic paroxysmal anxiety] without agoraphobia: Secondary | ICD-10-CM | POA: Diagnosis not present

## 2019-10-07 DIAGNOSIS — F339 Major depressive disorder, recurrent, unspecified: Secondary | ICD-10-CM | POA: Diagnosis not present

## 2019-10-07 DIAGNOSIS — J302 Other seasonal allergic rhinitis: Secondary | ICD-10-CM

## 2019-10-07 DIAGNOSIS — M24576 Contracture, unspecified foot: Secondary | ICD-10-CM

## 2019-10-07 DIAGNOSIS — K219 Gastro-esophageal reflux disease without esophagitis: Secondary | ICD-10-CM

## 2019-10-07 MED ORDER — BUPROPION HCL ER (SR) 150 MG PO TB12: 150.0000 mg | ORAL_TABLET | Freq: Two times a day (BID) | ORAL | 2 refills | Status: DC

## 2019-10-07 MED ORDER — KETOROLAC TROMETHAMINE 60 MG/2ML IM SOLN
60.0000 mg | Freq: Once | INTRAMUSCULAR | Status: AC
Start: 2019-10-07 — End: 2019-10-07
  Administered 2019-10-07: 60 mg via INTRAMUSCULAR

## 2019-10-07 MED ORDER — OMEPRAZOLE 20 MG PO CPDR: 20.0000 mg | DELAYED_RELEASE_CAPSULE | Freq: Every day | ORAL | 1 refills | Status: DC

## 2019-10-07 MED ORDER — ALPRAZOLAM 0.25 MG PO TABS: 0.2500 mg | ORAL_TABLET | Freq: Two times a day (BID) | ORAL | 1 refills | Status: DC | PRN

## 2019-10-07 MED ORDER — BUSPIRONE HCL 15 MG PO TABS: 15.0000 mg | ORAL_TABLET | Freq: Two times a day (BID) | ORAL | 2 refills | Status: DC
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
## 2019-10-07 NOTE — Patient Instructions (Addendum)
Increase buspirone by 7.5 mg for the first week - 15 mg AM and 7.5 mg PM Then increase to 15 mg twice daily.   Bupropion (Wellbutrin) - started 1 tablet once daily x3 days, then increase to twice daily. Stop smoking after 1 week of treatment. Remain on medication for 7-12 weeks.    Bupropion sustained-release tablets (smoking cessation) What is this medicine? BUPROPION (byoo PROE pee on) is used to help people quit smoking. This medicine may be used for other purposes; ask your health care provider or pharmacist if you have questions. COMMON BRAND NAME(S): Buproban, Zyban What should I tell my health care provider before I take this medicine? They need to know if you have any of these conditions:  an eating disorder, such as anorexia or bulimia  bipolar disorder or psychosis  diabetes or high blood sugar, treated with medication  glaucoma  head injury or brain tumor  heart disease, previous heart attack, or irregular heart beat  high blood pressure  kidney or liver disease  seizures  suicidal thoughts or a previous suicide attempt  Tourette's syndrome  weight loss  an unusual or allergic reaction to bupropion, other medicines, foods, dyes, or preservatives  breast-feeding  pregnant or trying to become pregnant How should I use this medicine? Take this medicine by mouth with a glass of water. Follow the directions on the prescription label. You can take it with or without food. If it upsets your stomach, take it with food. Do not cut, crush or chew this medicine. Take your medicine at regular intervals. If you take this medicine more than once a day, take your second dose at least 8 hours after you take your first dose. To limit difficulty in sleeping, avoid taking this medicine at bedtime. Do not take your medicine more often than directed. Do not stop taking this medicine suddenly except upon the advice of your doctor. Stopping this medicine too quickly may cause serious  side effects. A special MedGuide will be given to you by the pharmacist with each prescription and refill. Be sure to read this information carefully each time. Talk to your pediatrician regarding the use of this medicine in children. Special care may be needed. Overdosage: If you think you have taken too much of this medicine contact a poison control center or emergency room at once. NOTE: This medicine is only for you. Do not share this medicine with others. What if I miss a dose? If you miss a dose, skip the missed dose and take your next tablet at the regular time. There should be at least 8 hours between doses. Do not take double or extra doses. What may interact with this medicine? Do not take this medicine with any of the following medications:  linezolid  MAOIs like Azilect, Carbex, Eldepryl, Marplan, Nardil, and Parnate  methylene blue (injected into a vein)  other medicines that contain bupropion like Wellbutrin This medicine may also interact with the following medications:  alcohol  certain medicines for anxiety or sleep  certain medicines for blood pressure like metoprolol, propranolol  certain medicines for depression or psychotic disturbances  certain medicines for HIV or AIDS like efavirenz, lopinavir, nelfinavir, ritonavir  certain medicines for irregular heart beat like propafenone, flecainide  certain medicines for Parkinson's disease like amantadine, levodopa  certain medicines for seizures like carbamazepine, phenytoin, phenobarbital  cimetidine  clopidogrel  cyclophosphamide  digoxin  furazolidone  isoniazid  nicotine  orphenadrine  procarbazine  steroid medicines like prednisone or cortisone  stimulant medicines for attention disorders, weight loss, or to stay awake  tamoxifen  theophylline  thiotepa  ticlopidine  tramadol  warfarin This list may not describe all possible interactions. Give your health care provider a list of  all the medicines, herbs, non-prescription drugs, or dietary supplements you use. Also tell them if you smoke, drink alcohol, or use illegal drugs. Some items may interact with your medicine. What should I watch for while using this medicine? Visit your doctor or healthcare provider for regular checks on your progress. This medicine should be used together with a patient support program. It is important to participate in a behavioral program, counseling, or other support program that is recommended by your healthcare provider. This medicine may cause serious skin reactions. They can happen weeks to months after starting the medicine. Contact your healthcare provider right away if you notice fevers or flu-like symptoms with a rash. The rash may be red or purple and then turn into blisters or peeling of the skin. Or, you might notice a red rash with swelling of the face, lips or lymph nodes in your neck or under your arms. Patients and their families should watch out for new or worsening thoughts of suicide or depression. Also watch out for sudden changes in feelings such as feeling anxious, agitated, panicky, irritable, hostile, aggressive, impulsive, severely restless, overly excited and hyperactive, or not being able to sleep. If this happens, especially at the beginning of treatment or after a change in dose, call your healthcare provider. Avoid alcoholic drinks while taking this medicine. Drinking excessive alcoholic beverages, using sleeping or anxiety medicines, or quickly stopping the use of these agents while taking this medicine may increase your risk for a seizure. Do not drive or use heavy machinery until you know how this medicine affects you. This medicine can impair your ability to perform these tasks. Do not take this medicine close to bedtime. It may prevent you from sleeping. Your mouth may get dry. Chewing sugarless gum or sucking hard candy, and drinking plenty of water may help. Contact  your doctor if the problem does not go away or is severe. Do not use nicotine patches or chewing gum without the advice of your doctor or healthcare provider while taking this medicine. You may need to have your blood pressure taken regularly if your doctor recommends that you use both nicotine and this medicine together. What side effects may I notice from receiving this medicine? Side effects that you should report to your doctor or health care professional as soon as possible:  allergic reactions like skin rash, itching or hives, swelling of the face, lips, or tongue  breathing problems  changes in vision  confusion  elevated mood, decreased need for sleep, racing thoughts, impulsive behavior  fast or irregular heartbeat  hallucinations, loss of contact with reality  increased blood pressure  rash, fever, and swollen lymph nodes  redness, blistering, peeling, or loosening of the skin, including inside the mouth  seizures  suicidal thoughts or other mood changes  unusually weak or tired  vomiting Side effects that usually do not require medical attention (report to your doctor or health care professional if they continue or are bothersome):  constipation  headache  loss of appetite  nausea  tremors  weight loss This list may not describe all possible side effects. Call your doctor for medical advice about side effects. You may report side effects to FDA at 1-800-FDA-1088. Where should I keep my medicine? Keep out  of the reach of children. Store at room temperature between 20 and 25 degrees C (68 and 77 degrees F). Protect from light. Keep container tightly closed. Throw away any unused medicine after the expiration date. NOTE: This sheet is a summary. It may not cover all possible information. If you have questions about this medicine, talk to your doctor, pharmacist, or health care provider.  2020 Elsevier/Gold Standard (2018-05-13 13:59:09)

## 2019-10-09 ENCOUNTER — Other Ambulatory Visit: Payer: Self-pay

## 2019-10-09 ENCOUNTER — Emergency Department (HOSPITAL_COMMUNITY): Payer: Medicaid Other

## 2019-10-09 ENCOUNTER — Emergency Department (HOSPITAL_COMMUNITY)
Admission: EM | Admit: 2019-10-09 | Discharge: 2019-10-10 | Disposition: A | Discharge status: Home / Self Care | Payer: Medicaid Other | Attending: Emergency Medicine | Admitting: Emergency Medicine

## 2019-10-09 ENCOUNTER — Encounter (HOSPITAL_COMMUNITY): Payer: Self-pay | Admitting: Emergency Medicine

## 2019-10-09 DIAGNOSIS — K219 Gastro-esophageal reflux disease without esophagitis: Secondary | ICD-10-CM | POA: Insufficient documentation

## 2019-10-09 DIAGNOSIS — Z79899 Other long term (current) drug therapy: Secondary | ICD-10-CM | POA: Diagnosis not present

## 2019-10-09 DIAGNOSIS — M7918 Myalgia, other site: Secondary | ICD-10-CM | POA: Insufficient documentation

## 2019-10-09 DIAGNOSIS — R000 Tachycardia, unspecified: Secondary | ICD-10-CM | POA: Insufficient documentation

## 2019-10-09 DIAGNOSIS — Z9049 Acquired absence of other specified parts of digestive tract: Secondary | ICD-10-CM | POA: Insufficient documentation

## 2019-10-09 DIAGNOSIS — F17210 Nicotine dependence, cigarettes, uncomplicated: Secondary | ICD-10-CM | POA: Diagnosis not present

## 2019-10-09 DIAGNOSIS — E039 Hypothyroidism, unspecified: Secondary | ICD-10-CM | POA: Insufficient documentation

## 2019-10-09 DIAGNOSIS — R1013 Epigastric pain: Secondary | ICD-10-CM | POA: Diagnosis present

## 2019-10-09 DIAGNOSIS — R112 Nausea with vomiting, unspecified: Secondary | ICD-10-CM | POA: Diagnosis not present

## 2019-10-09 LAB — COMPREHENSIVE METABOLIC PANEL
ALT: 11 U/L (ref 0–44)
AST: 13 U/L — ABNORMAL LOW (ref 15–41)
Albumin: 3.5 g/dL (ref 3.5–5.0)
Alkaline Phosphatase: 77 U/L (ref 38–126)
Anion gap: 10 (ref 5–15)
BUN: 7 mg/dL (ref 6–20)
CO2: 27 mmol/L (ref 22–32)
Calcium: 9.1 mg/dL (ref 8.9–10.3)
Chloride: 103 mmol/L (ref 98–111)
Creatinine, Ser: 0.51 mg/dL (ref 0.44–1.00)
GFR calc Af Amer: >60 mL/min (ref >60)
GFR calc non Af Amer: >60 mL/min (ref >60)
Glucose, Bld: 114 mg/dL — ABNORMAL HIGH (ref 70–99)
Potassium: 3.2 mmol/L — ABNORMAL LOW (ref 3.5–5.1)
Sodium: 140 mmol/L (ref 135–145)
Total Bilirubin: 0.4 mg/dL (ref 0.3–1.2)
Total Protein: 7.8 g/dL (ref 6.5–8.1)

## 2019-10-09 LAB — LIPASE, BLOOD: Lipase: 26 U/L (ref 11–51)

## 2019-10-09 LAB — CBC WITH DIFFERENTIAL/PLATELET
Abs Immature Granulocytes: 0.09 10*3/uL — ABNORMAL HIGH (ref 0.00–0.07)
Basophils Absolute: 0.1 10*3/uL (ref 0.0–0.1)
Basophils Relative: 0 %
Eosinophils Absolute: 0.3 10*3/uL (ref 0.0–0.5)
Eosinophils Relative: 2 %
HCT: 45.3 % (ref 36.0–46.0)
Hemoglobin: 14.2 g/dL (ref 12.0–15.0)
Immature Granulocytes: 1 %
Lymphocytes Relative: 26 %
Lymphs Abs: 4.8 10*3/uL — ABNORMAL HIGH (ref 0.7–4.0)
MCH: 27.7 pg (ref 26.0–34.0)
MCHC: 31.3 g/dL (ref 30.0–36.0)
MCV: 88.3 fL (ref 80.0–100.0)
Monocytes Absolute: 1.3 10*3/uL — ABNORMAL HIGH (ref 0.1–1.0)
Monocytes Relative: 7 %
Neutro Abs: 11.6 10*3/uL — ABNORMAL HIGH (ref 1.7–7.7)
Neutrophils Relative %: 64 %
Platelets: 596 10*3/uL — ABNORMAL HIGH (ref 150–400)
RBC: 5.13 MIL/uL — ABNORMAL HIGH (ref 3.87–5.11)
RDW: 17.1 % — ABNORMAL HIGH (ref 11.5–15.5)
WBC: 18.2 10*3/uL — ABNORMAL HIGH (ref 4.0–10.5)
nRBC: 0 % (ref 0.0–0.2)

## 2019-10-09 MED ORDER — SODIUM CHLORIDE 0.9 % IV BOLUS
1000.0000 mL | Freq: Once | INTRAVENOUS | Status: AC
  Administered 2019-10-09: 1000 mL via INTRAVENOUS

## 2019-10-09 MED ORDER — HYDROMORPHONE HCL 1 MG/ML IJ SOLN
1.0000 mg | Freq: Once | INTRAMUSCULAR | Status: AC
  Administered 2019-10-09: 1 mg via INTRAVENOUS
  Filled 2019-10-09: qty 1

## 2019-10-09 MED ORDER — ONDANSETRON HCL 4 MG/2ML IJ SOLN
4.0000 mg | Freq: Once | INTRAMUSCULAR | Status: AC
  Administered 2019-10-09: 4 mg via INTRAVENOUS
  Filled 2019-10-09: qty 2

## 2019-10-09 NOTE — ED Triage Notes (Signed)
Pt arrives via [facility] w/complaints of her catheter coming out. Pt states she has appointment w/urologist next week & states she is on ceflex for UTI. Pt complains of nausea & L hip pain. No deformity noted to extremity. EMS states pt had 2 episodes of emesis en route to hospital.
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
## 2019-10-10 ENCOUNTER — Encounter: Payer: Self-pay | Admitting: Family Medicine

## 2019-10-10 ENCOUNTER — Ambulatory Visit (INDEPENDENT_AMBULATORY_CARE_PROVIDER_SITE_OTHER): Payer: Medicaid Other | Admitting: Family Medicine

## 2019-10-10 VITALS — BP 113/74 | HR 72 | Wt 240.0 lb

## 2019-10-10 DIAGNOSIS — F17200 Nicotine dependence, unspecified, uncomplicated: Secondary | ICD-10-CM | POA: Diagnosis not present

## 2019-10-10 DIAGNOSIS — S32012A Unstable burst fracture of first lumbar vertebra, initial encounter for closed fracture: Secondary | ICD-10-CM

## 2019-10-10 DIAGNOSIS — Z01419 Encounter for gynecological examination (general) (routine) without abnormal findings: Secondary | ICD-10-CM

## 2019-10-10 DIAGNOSIS — N926 Irregular menstruation, unspecified: Secondary | ICD-10-CM

## 2019-10-10 DIAGNOSIS — N312 Flaccid neuropathic bladder, not elsewhere classified: Secondary | ICD-10-CM

## 2019-10-10 DIAGNOSIS — E038 Other specified hypothyroidism: Secondary | ICD-10-CM

## 2019-10-10 DIAGNOSIS — Z0000 Encounter for general adult medical examination without abnormal findings: Secondary | ICD-10-CM | POA: Diagnosis not present

## 2019-10-10 LAB — URINALYSIS, ROUTINE W REFLEX MICROSCOPIC
Bacteria, UA: NONE SEEN
Bilirubin Urine: NEGATIVE
Glucose, UA: NEGATIVE mg/dL
Hgb urine dipstick: NEGATIVE
Ketones, ur: NEGATIVE mg/dL
Nitrite: NEGATIVE
Protein, ur: 100 mg/dL — AB
Specific Gravity, Urine: >1.046 — ABNORMAL HIGH (ref 1.005–1.030)
pH: 6 (ref 5.0–8.0)

## 2019-10-10 LAB — PREGNANCY, URINE: Preg Test, Ur: NEGATIVE

## 2019-10-10 MED ORDER — ONDANSETRON HCL 4 MG/2ML IJ SOLN
4.0000 mg | Freq: Once | INTRAMUSCULAR | Status: AC
  Administered 2019-10-10: 4 mg via INTRAVENOUS
  Filled 2019-10-10: qty 2

## 2019-10-10 MED ORDER — IOHEXOL 300 MG/ML  SOLN
100.0000 mL | Freq: Once | INTRAMUSCULAR | Status: AC | PRN
  Administered 2019-10-10: 100 mL via INTRAVENOUS

## 2019-10-10 MED ORDER — HYDROMORPHONE HCL 1 MG/ML IJ SOLN
1.0000 mg | Freq: Once | INTRAMUSCULAR | Status: AC
  Administered 2019-10-10: 1 mg via INTRAVENOUS
  Filled 2019-10-10: qty 1

## 2019-10-10 MED ORDER — PROMETHAZINE HCL 25 MG PO TABS
25.0000 mg | ORAL_TABLET | Freq: Four times a day (QID) | ORAL | 0 refills | Status: DC | PRN
Start: 2019-10-10 — End: 2019-12-02

## 2019-10-10 MED ORDER — ONDANSETRON HCL 4 MG PO TABS
4.0000 mg | ORAL_TABLET | Freq: Three times a day (TID) | ORAL | 0 refills | Status: DC | PRN
Start: 2019-10-10 — End: 2020-11-21

## 2019-10-10 NOTE — ED Notes (Signed)
Patient transported to CT 

## 2019-10-10 NOTE — ED Provider Notes (Signed)
Patient was signed out to me by Dr. Langston Masker to follow-up on remainder of work-up.  Patient with urinalysis and CT scan pending at time of signout.  She has been seen for acute nausea, vomiting and epigastric pain.  Lab work is reassuring.  Vital signs are improved, no longer hypertensive or tachycardic.  CT scan unremarkable.  Urinalysis consistent with previous chronic abnormality of her UA, doubt acute infection.  Will await results of culture.   Orpah Greek, MD 10/10/19 [phone]
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
## 2019-10-11 ENCOUNTER — Encounter (HOSPITAL_BASED_OUTPATIENT_CLINIC_OR_DEPARTMENT_OTHER): Payer: Medicaid Other | Attending: Internal Medicine | Admitting: Internal Medicine

## 2019-10-11 LAB — TSH: TSH: 2.77 u[IU]/mL (ref 0.450–4.500)

## 2019-10-11 NOTE — Progress Notes (Signed)
[name] ([id number]) Visit Report for 10/11/2019 Arrival Information Details Patient Name: Date of Service: Jamie, [name] 10/11/2019 1:15 PM Medical Record Number: [id number] Patient Account Number: [id number] Date of Birth/Sex: Treating RN: 08-Feb-1981 (39 y.o. [name] Primary Care [name]: [name], [name] Other Clinician: Referring Robynne Roat: Treating Kenedee Molesky/Extender: Lenore Cordia, [name] Weeks in Treatment: 57 Visit Information History Since Last Visit Added or deleted any medications: Yes Patient Arrived: Wheel Chair Any new allergies or adverse reactions: No Arrival Time: 13:08 Had a fall or experienced change in No Accompanied By: caregiver activities of daily living that may affect Transfer Assistance: Harrel Lemon Lift risk of falls: Patient Identification Verified: Yes Signs or symptoms of abuse/neglect since last visito No Secondary Verification Process Completed: Yes Hospitalized since last visit: No Patient Requires Transmission-Based Precautions: No Implantable device outside of the clinic excluding No Patient Has Alerts: No cellular tissue based products placed in the center since last visit: Has Dressing in Place as Prescribed: Yes Pain Present Now: Yes Electronic Signature(s) Signed: 10/11/2019 5:26:04 PM By: Baruch Gouty RN, BSN Entered By: Baruch Gouty on 10/11/2019 13:10:20 -------------------------------------------------------------------------------- Encounter Discharge Information Details Patient Name: Date of Service: Jamie Boyden A. 10/11/2019 1:15 PM Medical Record Number: [id number] Patient Account Number: [id number] Date of Birth/Sex: Treating RN: 1980/08/09 (39 y.o. [name] Primary Care [name]: [name], [name] Other Clinician: Referring [name]: Treating [name]/Extender: Lenore Cordia, [name] Weeks in Treatment: [phone] Encounter Discharge Information Items Discharge  Condition: Stable Ambulatory Status: Wheelchair Discharge Destination: Home Transportation: Other Accompanied By: caregiver Schedule Follow-up Appointment: Yes Clinical Summary of Care: Patient Declined Electronic Signature(s) Signed: 10/11/2019 5:12:21 PM By: Kela Millin Entered By: Kela Millin on 10/11/2019 14:28:49 -------------------------------------------------------------------------------- Pain Assessment Details Patient Name: Date of Service: [name] 10/11/2019 1:15 PM Medical Record Number: [id number] Patient Account Number: [id number] Date of Birth/Sex: Treating RN: 11-30-1980 (39 y.o. [name] Primary Care Janai Brannigan: [name], [name] Other Clinician: Referring [name]: Treating Dazha Kempa/Extender: Lenore Cordia, [name] Weeks in Treatment: 171 Active Problems Location of Pain Severity and Description of Pain Patient Has Paino Yes Site Locations Pain Location: Pain in Ulcers With Dressing Change: Yes Duration of the Pain. Constant / Intermittento Constant Rate the pain. Current Pain Level: 7 Worst Pain Level: 8 Least Pain Level: 4 Character of Pain Describe the Pain: Aching Pain Management and Medication Current Pain Management: Medication: Yes Is the Current Pain Management Adequate: Adequate How does your wound impact your activities of daily livingo Sleep: Yes Bathing: No Appetite: No Relationship With Others: No Bladder Continence: No Emotions: No Bowel Continence: No Work: No Toileting: No Drive: No Dressing: No Hobbies: No Electronic Signature(s) Signed: 10/11/2019 5:26:04 PM By: Baruch Gouty RN, BSN Entered By: Baruch Gouty on 10/11/2019 13:12:40 -------------------------------------------------------------------------------- Wound Assessment Details Patient Name: Date of Service: Jamie Boyden A. 10/11/2019 1:15 PM Medical Record Number: [id number] Patient Account Number: [id number] Date  of Birth/Sex: Treating RN: [birth year] (39 y.o. [name] Primary Care Libbi Towner: [name], [name] Other Clinician: Referring Shyrl Obi: Treating Matea Stanard/Extender: Lenore Cordia, [name] Weeks in Treatment: 171 Wound Status Wound Number: 1 Primary Etiology: Pressure Ulcer Wound Location: Medial Sacrum Wound Status: Open Wounding Event: Pressure Injury Comorbid History: Anemia, Osteomyelitis Date Acquired: 05/15/2016 Weeks Of Treatment: 171 Clustered Wound: No Wound Measurements Length: (cm) 3 Width: (cm) 2.5 Depth: (cm) 2.8 Area: (cm) 5.89 Volume: (cm) 16.493 % Reduction in Area: 82.1% % Reduction in Volume: 88.6% Epithelialization: None Tunneling:  No Undermining: Yes Starting Position (o'clock): 1 Ending Position (o'clock): 5 Maximum Distance: (cm) 2.9 Wound Description Classification: Category/Stage IV Wound Margin: Distinct, outline attached Exudate Amount: Medium Exudate Type: Serosanguineous Exudate Color: red, brown Foul Odor After Cleansing: No Slough/Fibrino Yes Wound Bed Granulation Amount: Medium (34-66%) Exposed Structure Granulation Quality: Red Fascia Exposed: No Necrotic Amount: Medium (34-66%) Fat Layer (Subcutaneous Tissue) Exposed: Yes Necrotic Quality: Adherent Slough Tendon Exposed: No Muscle Exposed: No Joint Exposed: No Bone Exposed: No Treatment Notes Wound #1 (Medial Sacrum) 1. Cleanse With Wound Cleanser 2. Periwound Care Skin Prep 3. Primary Dressing Applied Calcium Alginate Ag 4. Secondary Dressing ABD Pad Dry Gauze 5. Secured With Recruitment consultant) Signed: 10/11/2019 5:26:04 PM By: Baruch Gouty RN, BSN Entered By: Baruch Gouty on 10/11/2019 13:22:29 -------------------------------------------------------------------------------- [place] Details Patient Name: Date of Service: Jamie Boyden A. 10/11/2019 1:15 PM Medical Record Number: [id number] Patient Account Number: [id number] Date  of Birth/Sex: Treating RN: 1980/07/06 (39 y.o. [name], [name] Primary Care Ranay Ketter: [name], [name] Other Clinician: Referring Mckenzie Bove: Treating Jalani Rominger/Extender: Lenore Cordia, [name] Weeks in Treatment: 171 Vital Signs Time Taken: 13:11 Temperature (F): 98.2 Height (in): 65 Pulse (bpm): 88 Source: Stated Respiratory Rate (breaths/min): 18 Weight (lbs): 201 Blood Pressure (mmHg): 118/83 Source: Stated Reference Range: 80 - 120 mg / dl Body Mass Index (BMI): 33.4 Electronic Signature(s) Signed: 10/11/2019 5:26:04 PM By: Baruch Gouty RN, BSN Entered By: Baruch Gouty on 10/11/2019 13:12:17

## 2019-10-11 NOTE — Progress Notes (Signed)
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 using silver alginate. Slight improvement in overall wound volume including depth although there is still undermining. She tells me she is offloading this is much as possible 8/10-Patient back at 1 month, continuing to use silver alginate although she has run out and therefore using saline wet-to-dry, undermining is  minimal, continuing attempts at offloading according to patient Objective Constitutional alert and oriented x 3. sitting or standing blood pressure is within target range for patient.. supine blood pressure is within target range for patient.. pulse regular and within target range for patient.[name] respirations regular, non-labored and within target range for patient.[name] temperature within target range for patient.. Well- nourished and well-hydrated in no acute distress. Vitals Time Taken: 1:11 PM, Height: 65 in, Source: Stated, Weight: 201 lbs, Source: Stated, BMI: 33.4, Temperature: 98.2 F, Pulse: 88 bpm, Respiratory Rate: 18 breaths/min, Blood Pressure: 118/83 mmHg. General Notes: Sacral wound, depth is about the same, wound base appears fairly good with granulation and not much slough or signs of infection, wound edges are well demarcated, not macerated Integumentary (Hair, Skin) Wound #1 status is Open. Original cause of wound was Pressure Injury. The wound is located on the Medial Sacrum. The wound measures 3cm length x 2.5cm width x 2.8cm depth; 5.89cm^2 area and 16.493cm^3 volume. There is Fat Layer (Subcutaneous Tissue) Exposed exposed. There is no  tunneling noted, however, there is undermining starting at 1:00 and ending at 5:00 with a maximum distance of 2.9cm. There is a medium amount of serosanguineous drainage noted. The wound margin is distinct with the outline attached to the wound base. There is medium (34-66%) red granulation within the wound bed. There is a medium (34-66%) amount of necrotic tissue within the wound bed including Adherent Slough. Assessment Active Problems ICD-10 Pressure ulcer of sacral region, stage 4 Paraplegia, incomplete Plan Follow-up Appointments: Return appointment in 1 month. Dressing Change Frequency: Change dressing every day. Wound Cleansing: May shower and wash wound with soap and water. Primary Wound Dressing: Calcium Alginate with Silver -  until patient runs out then use 1/4 strenght dakins solution moist gauze to dry daily , instructions given on how to make Secondary Dressing: Dry Gauze ABD pad - secure with tape -Continue daily dressing changes with silver alginate and when she runs out of that we will switch over to quarter strength Dakin's wet-to-dry -Continue offloading attempts as she is currently doing -Return next month Electronic Signature(s) Signed: 10/11/2019 2:12:18 PM By: Tobi Bastos MD, MBA Entered By: Tobi Bastos on 10/11/2019 14:12:18 -------------------------------------------------------------------------------- SuperBill Details Patient Name: Date of Service: [name] 10/11/2019 Medical Record Number: [id number] Patient Account Number: [id number] Date of Birth/Sex: Treating RN: 1981-01-17 (39 y.o. Orvan Falconer Primary Care Provider: Jori Moll, Karsten Fells Other Clinician: Referring Provider: Treating Provider/Extender: Lenore Cordia, [name] Weeks in Treatment: 171 Diagnosis Coding ICD-10 Codes Code Description L89.154 Pressure ulcer of sacral region, stage 4 G82.22 Paraplegia, incomplete Physician Procedures : CPT4 Code Description Modifier [id number] - WC PHYS LEVEL 3 - EST PT ICD-10 Diagnosis Description L89.154 Pressure ulcer of sacral region, stage 4 Quantity: 1 Electronic Signature(s) Signed: 10/11/2019 2:12:31 PM By: Tobi Bastos MD, MBA Entered By: Tobi Bastos on 10/11/2019 14:12:30

## 2019-10-12 LAB — URINE CULTURE: Culture: 20000 — AB

## 2019-10-13 ENCOUNTER — Telehealth: Payer: Self-pay | Admitting: Emergency Medicine

## 2019-10-13 NOTE — Telephone Encounter (Signed)
Post ED Visit - Positive Culture Follow-up  Culture report reviewed by antimicrobial stewardship pharmacist: [place] Team []  Elenor Quinones, Pharm.D. []  Heide Guile, Pharm.D., BCPS AQ-ID []  Parks Neptune, Pharm.D., BCPS []  Alycia Rossetti, Pharm.D., BCPS []  [place], Pharm.D., BCPS, AAHIVP []  Legrand Como, Pharm.D., BCPS, AAHIVP []  Salome Arnt, PharmD, BCPS []  Johnnette Gourd, PharmD, BCPS []  Hughes Better, PharmD, BCPS []  Leeroy Cha, PharmD []  Laqueta Linden, PharmD, BCPS []  Albertina Parr, PharmD Norina Buzzard PharmD  [place] Team []  Leodis Sias, PharmD []  Lindell Spar, PharmD []  Royetta Asal, PharmD []  Graylin Shiver, Rph []  Rema Fendt) Glennon Mac, PharmD []  Arlyn Dunning, PharmD []  Netta Cedars, PharmD []  Dia Sitter, PharmD []  Leone Haven, PharmD []  Gretta Arab, PharmD []  Theodis Shove, PharmD []  Peggyann Juba, PharmD []  Reuel Boom, PharmD   Positive urine culture Treated with none, asymptomatic, no further patient follow-up is required at this time.  Hazle Nordmann 10/13/2019, 1:10 PM

## 2019-10-17 ENCOUNTER — Other Ambulatory Visit: Payer: Self-pay | Admitting: Student

## 2019-10-17 ENCOUNTER — Telehealth: Payer: Self-pay

## 2019-10-17 ENCOUNTER — Other Ambulatory Visit (HOSPITAL_COMMUNITY): Payer: Self-pay | Admitting: Student

## 2019-10-17 DIAGNOSIS — M5412 Radiculopathy, cervical region: Secondary | ICD-10-CM

## 2019-10-17 NOTE — Telephone Encounter (Addendum)
-----   Message from Truett Mainland, DO sent at 10/17/2019 12:44 PM EDT ----- Please let pt know that thyroid studies were normal.  L/M for pt that her results are normal if she has any questions or concerns to please give the office a call back.    Mel Almond, RN  10/17/19

## 2019-10-20 ENCOUNTER — Emergency Department (HOSPITAL_COMMUNITY): Payer: Medicaid Other

## 2019-10-20 ENCOUNTER — Emergency Department (HOSPITAL_COMMUNITY)
Admission: EM | Admit: 2019-10-20 | Discharge: 2019-10-20 | Disposition: A | Discharge status: Home / Self Care | Payer: Medicaid Other | Attending: Emergency Medicine | Admitting: Emergency Medicine

## 2019-10-20 ENCOUNTER — Other Ambulatory Visit: Payer: Self-pay

## 2019-10-20 ENCOUNTER — Encounter (HOSPITAL_COMMUNITY): Payer: Self-pay | Admitting: Emergency Medicine

## 2019-10-20 DIAGNOSIS — T83021A Displacement of indwelling urethral catheter, initial encounter: Secondary | ICD-10-CM

## 2019-10-20 DIAGNOSIS — E039 Hypothyroidism, unspecified: Secondary | ICD-10-CM | POA: Insufficient documentation

## 2019-10-20 DIAGNOSIS — F17210 Nicotine dependence, cigarettes, uncomplicated: Secondary | ICD-10-CM | POA: Diagnosis not present

## 2019-10-20 DIAGNOSIS — R05 Cough: Secondary | ICD-10-CM | POA: Diagnosis not present

## 2019-10-20 LAB — URINALYSIS, ROUTINE W REFLEX MICROSCOPIC
Bacteria, UA: NONE SEEN
Bilirubin Urine: NEGATIVE
Glucose, UA: NEGATIVE mg/dL
Ketones, ur: NEGATIVE mg/dL
Nitrite: NEGATIVE
Protein, ur: NEGATIVE mg/dL
Specific Gravity, Urine: 1.003 — ABNORMAL LOW (ref 1.005–1.030)
pH: 7 (ref 5.0–8.0)

## 2019-10-20 MED ORDER — HYDROMORPHONE HCL 1 MG/ML IJ SOLN
1.0000 mg | Freq: Once | INTRAMUSCULAR | Status: AC
  Administered 2019-10-20: 1 mg via INTRAVENOUS
  Filled 2019-10-20: qty 1

## 2019-10-20 MED ORDER — LIDOCAINE HCL URETHRAL/MUCOSAL 2 % EX GEL
1.0000 "application " | Freq: Once | CUTANEOUS | Status: AC
  Administered 2019-10-20: 1 via URETHRAL
  Filled 2019-10-20: qty 10

## 2019-10-20 MED ORDER — HYDROMORPHONE HCL 1 MG/ML IJ SOLN
1.0000 mg | Freq: Once | INTRAMUSCULAR | Status: AC
  Administered 2019-10-20: 1 mg via INTRAVENOUS

## 2019-10-20 MED ORDER — HYDROMORPHONE HCL 1 MG/ML IJ SOLN
1.0000 mg | Freq: Once | INTRAMUSCULAR | Status: DC
  Filled 2019-10-20: qty 1
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
## 2019-10-20 NOTE — Clinical Social Work Note (Signed)
Transition of Care [facility]) - Emergency Department Mini Assessment  Patient Details  Name: [name] MRN: [id number] Date of Birth: 07-13-1980  Transition of Care [facility]) CM/SW Contact:    Sherie Don, LCSW Phone Number: 10/20/2019, 2:06 PM  Clinical Narrative: Patient is a 39 year old female who presented to the ED for catheter issues. TOC received consult for catheter assistance. CSW contacted Barbaraann Rondo with [employer] and was informed [employer] does not carry Purewick supplies at this time. CSW called [place] and spoke with [place]. Per Tamela Oddi, [place] does not carry catheter supplies. CSW called Purewick and was informed they do not provide supplies for most insurances, including Medicaid. The discounted price for the Purewick system is $299 and a box of 30 Purewicks is $195. CSW updated patient. Patient verbalized understanding. TOC signing off.  ED Mini Assessment: What brought you to the Emergency Department? : Catheter issues Barriers to Discharge: ED Barriers Resolved Barrier interventions: Attempted to set up [place] for patient; Purewick does not currently accept Medicaid insurance Means of departure: Car Interventions which prevented an admission or readmission: Other (must enter comment) (Called DME companies regarding [place])  Patient Contact and Communications Key Contact 1: [employer] Barbaraann Rondo) Key Contact 2: [place] (Doris) Contact Date: 10/20/19     Call outcome: Patient not eligible for Purewick through insurance  Admission diagnosis:  Cathether Issue Patient Active Problem List   Diagnosis Date Noted  . Generalized anxiety disorder 07/04/2019  . Panic attacks 07/04/2019  . Controlled substance agreement signed 07/04/2019  . Urinary catheter in place 07/04/2019  . Mixed hyperlipidemia 07/04/2019  . Neurogenic dysfunction of the urinary bladder 07/04/2019  . Tobacco use 07/04/2019  . Wound infection after surgery 01/07/2019  . Pressure ulcer   .  Osteomyelitis of vertebra, sacral and sacrococcygeal region ([place])   . GERD (gastroesophageal reflux disease)   . Protein-calorie malnutrition, severe ([place]) 11/07/2018  . Hypokalemia 05/04/2018  . Chronic pain 05/04/2018  . Tinea   . 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 Sacral osteomyelitis ([place])   . Pressure injury of skin 06/11/2016  . Normocytic anemia 06/10/2016  . 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 L1 vertebral fracture ([place]) 04/07/2016  . Depression with anxiety 06/06/2013  . Dysmenorrhea 08/09/2012  . Irregular menstrual cycle 08/09/2012  . Shoulder pain, left 11/07/2011  . H/O vitamin D deficiency 09/02/2011  . Obesity 08/06/2010  . Hypothyroidism 08/06/2010  . Recurrent depression ([place]) 08/06/2010  . Smoking 08/06/2010  . Mental retardation, mild (I.Q. 50-70) 08/06/2010   PCP:  Loman Brooklyn, [name] Pharmacy:   [id number] - [place], [place] [address] Phone: [phone] Fax: [phone]

## 2019-10-20 NOTE — ED Triage Notes (Signed)
Pt arrives via [facility] for urethral pain and foley catheter coming outt states she has a tear in her urethra. Pt c/o of pain in urethra and lower back.

## 2019-10-20 NOTE — Discharge Instructions (Addendum)
You were evaluated in the Emergency Department and after careful evaluation, we did not find any emergent condition requiring admission or further testing in the hospital.  Your exam/testing today is overall reassuring.  Please return to the Emergency Department if you experience any worsening of your condition.   Thank you for allowing us to be a part of your care. 

## 2019-10-20 NOTE — ED Provider Notes (Addendum)
Pt seen by Dr Sedonia Small.  Plan was discharge after foley replaced.  Urine without signs of infection.   Clinical Course as of Oct 21 1502  Thu Oct 20, 2019  1651 Urine without signs of infection   [JK]    Clinical Course User Index [JK] Dorie Rank, MD    Dorie Rank, MD 10/20/19 1651    Dorie Rank, MD 10/21/19 1505

## 2019-10-26 ENCOUNTER — Other Ambulatory Visit: Payer: Self-pay

## 2019-10-26 ENCOUNTER — Emergency Department (HOSPITAL_COMMUNITY): Payer: Medicaid Other

## 2019-10-26 ENCOUNTER — Encounter (HOSPITAL_COMMUNITY): Payer: Self-pay | Admitting: *Deleted

## 2019-10-26 ENCOUNTER — Emergency Department (HOSPITAL_COMMUNITY)
Admission: EM | Admit: 2019-10-26 | Discharge: 2019-10-27 | Disposition: A | Discharge status: Home / Self Care | Payer: Medicaid Other | Attending: Emergency Medicine | Admitting: Emergency Medicine

## 2019-10-26 DIAGNOSIS — R0602 Shortness of breath: Secondary | ICD-10-CM | POA: Insufficient documentation

## 2019-10-26 DIAGNOSIS — Z79890 Hormone replacement therapy (postmenopausal): Secondary | ICD-10-CM | POA: Diagnosis not present

## 2019-10-26 DIAGNOSIS — T839XXA Unspecified complication of genitourinary prosthetic device, implant and graft, initial encounter: Secondary | ICD-10-CM

## 2019-10-26 DIAGNOSIS — F17210 Nicotine dependence, cigarettes, uncomplicated: Secondary | ICD-10-CM | POA: Diagnosis not present

## 2019-10-26 DIAGNOSIS — Z79899 Other long term (current) drug therapy: Secondary | ICD-10-CM | POA: Diagnosis not present

## 2019-10-26 DIAGNOSIS — R3981 Functional urinary incontinence: Secondary | ICD-10-CM | POA: Diagnosis not present

## 2019-10-26 DIAGNOSIS — Z20822 Contact with and (suspected) exposure to covid-19: Secondary | ICD-10-CM | POA: Diagnosis not present

## 2019-10-26 DIAGNOSIS — E039 Hypothyroidism, unspecified: Secondary | ICD-10-CM | POA: Insufficient documentation

## 2019-10-26 DIAGNOSIS — R05 Cough: Secondary | ICD-10-CM | POA: Diagnosis present

## 2019-10-26 DIAGNOSIS — T83098A Other mechanical complication of other indwelling urethral catheter, initial encounter: Secondary | ICD-10-CM | POA: Diagnosis not present

## 2019-10-26 DIAGNOSIS — J189 Pneumonia, unspecified organism: Secondary | ICD-10-CM | POA: Insufficient documentation

## 2019-10-26 LAB — CBC WITH DIFFERENTIAL/PLATELET
Abs Immature Granulocytes: 0.07 10*3/uL (ref 0.00–0.07)
Basophils Absolute: 0.1 10*3/uL (ref 0.0–0.1)
Basophils Relative: 0 %
Eosinophils Absolute: 0.5 10*3/uL (ref 0.0–0.5)
Eosinophils Relative: 3 %
HCT: 42.8 % (ref 36.0–46.0)
Hemoglobin: 13.3 g/dL (ref 12.0–15.0)
Immature Granulocytes: 0 %
Lymphocytes Relative: 17 %
Lymphs Abs: 2.6 10*3/uL (ref 0.7–4.0)
MCH: 28.1 pg (ref 26.0–34.0)
MCHC: 31.1 g/dL (ref 30.0–36.0)
MCV: 90.3 fL (ref 80.0–100.0)
Monocytes Absolute: 0.7 10*3/uL (ref 0.1–1.0)
Monocytes Relative: 5 %
Neutro Abs: 11.6 10*3/uL — ABNORMAL HIGH (ref 1.7–7.7)
Neutrophils Relative %: 75 %
Platelets: 496 10*3/uL — ABNORMAL HIGH (ref 150–400)
RBC: 4.74 MIL/uL (ref 3.87–5.11)
RDW: 17.2 % — ABNORMAL HIGH (ref 11.5–15.5)
WBC: 15.6 10*3/uL — ABNORMAL HIGH (ref 4.0–10.5)
nRBC: 0 % (ref 0.0–0.2)

## 2019-10-26 LAB — BASIC METABOLIC PANEL
Anion gap: 13 (ref 5–15)
BUN: 10 mg/dL (ref 6–20)
CO2: 24 mmol/L (ref 22–32)
Calcium: 8.5 mg/dL — ABNORMAL LOW (ref 8.9–10.3)
Chloride: 102 mmol/L (ref 98–111)
Creatinine, Ser: 0.58 mg/dL (ref 0.44–1.00)
GFR calc Af Amer: >60 mL/min (ref >60)
GFR calc non Af Amer: >60 mL/min (ref >60)
Glucose, Bld: 149 mg/dL — ABNORMAL HIGH (ref 70–99)
Potassium: 3.6 mmol/L (ref 3.5–5.1)
Sodium: 139 mmol/L (ref 135–145)

## 2019-10-26 LAB — URINALYSIS, ROUTINE W REFLEX MICROSCOPIC
Bacteria, UA: NONE SEEN
Bilirubin Urine: NEGATIVE
Glucose, UA: NEGATIVE mg/dL
Ketones, ur: NEGATIVE mg/dL
Nitrite: NEGATIVE
Protein, ur: NEGATIVE mg/dL
Specific Gravity, Urine: 1.005 (ref 1.005–1.030)
pH: 7 (ref 5.0–8.0)

## 2019-10-26 LAB — SARS CORONAVIRUS 2 BY RT PCR (HOSPITAL ORDER, PERFORMED IN ~~LOC~~ HOSPITAL LAB): SARS Coronavirus 2: NEGATIVE

## 2019-10-26 LAB — BRAIN NATRIURETIC PEPTIDE: B Natriuretic Peptide: 39 pg/mL (ref 0.0–100.0)

## 2019-10-26 LAB — POC URINE PREG, ED: Preg Test, Ur: NEGATIVE

## 2019-10-26 MED ORDER — BENZONATATE 100 MG PO CAPS: 200.0000 mg | ORAL_CAPSULE | Freq: Three times a day (TID) | ORAL | 0 refills | Status: DC | PRN

## 2019-10-26 MED ORDER — AZITHROMYCIN 250 MG PO TABS: ORAL_TABLET | ORAL | 0 refills | Status: DC

## 2019-10-26 MED ORDER — AZITHROMYCIN 250 MG PO TABS
500.0000 mg | ORAL_TABLET | Freq: Once | ORAL | Status: AC
  Administered 2019-10-26: 500 mg via ORAL
  Filled 2019-10-26: qty 2

## 2019-10-26 MED ORDER — BENZONATATE 100 MG PO CAPS
200.0000 mg | ORAL_CAPSULE | Freq: Once | ORAL | Status: AC
  Administered 2019-10-26: 200 mg via ORAL
  Filled 2019-10-26: qty 2

## 2019-10-26 MED ORDER — PROMETHAZINE-CODEINE 6.25-10 MG/5ML PO SYRP: 5.0000 mL | ORAL_SOLUTION | ORAL | 0 refills | Status: DC | PRN

## 2019-10-26 MED ORDER — HYDROCOD POLST-CPM POLST ER 10-8 MG/5ML PO SUER
5.0000 mL | Freq: Once | ORAL | Status: AC
  Administered 2019-10-26: 5 mL via ORAL
  Filled 2019-10-26: qty 5

## 2019-10-26 MED ORDER — MORPHINE SULFATE (PF) 4 MG/ML IV SOLN
4.0000 mg | Freq: Once | INTRAVENOUS | Status: AC
  Administered 2019-10-26: 4 mg via INTRAVENOUS
  Filled 2019-10-26: qty 1

## 2019-10-26 MED ORDER — ONDANSETRON HCL 4 MG/2ML IJ SOLN
4.0000 mg | Freq: Once | INTRAMUSCULAR | Status: AC
  Administered 2019-10-26: 4 mg via INTRAVENOUS
  Filled 2019-10-26: qty 2

## 2019-10-26 MED ORDER — ALBUTEROL SULFATE HFA 108 (90 BASE) MCG/ACT IN AERS
2.0000 | INHALATION_SPRAY | Freq: Once | RESPIRATORY_TRACT | Status: AC
  Administered 2019-10-26: 2 via RESPIRATORY_TRACT
  Filled 2019-10-26: qty 6.7

## 2019-10-26 MED ORDER — IOHEXOL 300 MG/ML  SOLN
100.0000 mL | Freq: Once | INTRAMUSCULAR | Status: AC | PRN
  Administered 2019-10-26: 100 mL via INTRAVENOUS

## 2019-10-26 MED ORDER — SODIUM CHLORIDE 0.9 % IV SOLN
1.0000 g | Freq: Once | INTRAVENOUS | Status: AC
  Administered 2019-10-26: 1 g via INTRAVENOUS
  Filled 2019-10-26: qty 10
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
## 2019-10-26 NOTE — Discharge Instructions (Signed)
Take your next dose of Zithromax tomorrow evening.  You have also been prescribed 2 additional medicines to help you with your cough, Tessalon and Phenergan with codeine.  Phenergan with codeine may make you drowsy so use caution with this medication.  Use the albuterol inhaler taking 2 puffs every 4 hours which may also help you with coughing.  Rest to make sure you are drinking plenty of fluids.  Plan to see your urologist on Friday as scheduled have your Foley catheter replaced.  In the interim you will need to use Depends for your chronic incontinence.

## 2019-10-26 NOTE — ED Triage Notes (Signed)
Pt brought in by [facility] with c/o foley catheter coming out and cough and weakness x 1 month. EMS reports O2 sat 99% on RA.

## 2019-10-26 NOTE — ED Notes (Signed)
Pt asking for additional pain/nausea meds, EDPA notified.

## 2019-10-26 NOTE — ED Notes (Signed)
Attempted foley multiple times without success, PA notified and pt placed on purewick.

## 2019-10-27 MED ORDER — TIZANIDINE HCL 4 MG PO TABS
4.0000 mg | ORAL_TABLET | Freq: Once | ORAL | Status: AC
  Administered 2019-10-27: 4 mg via ORAL
  Filled 2019-10-27: qty 1

## 2019-10-27 MED ORDER — GABAPENTIN 300 MG PO CAPS
600.0000 mg | ORAL_CAPSULE | Freq: Once | ORAL | Status: AC
  Administered 2019-10-27: 600 mg via ORAL
  Filled 2019-10-27: qty 2

## 2019-10-27 MED ORDER — OXYCODONE-ACETAMINOPHEN 5-325 MG PO TABS
2.0000 | ORAL_TABLET | Freq: Once | ORAL | Status: AC
  Administered 2019-10-27: 2 via ORAL
  Filled 2019-10-27: qty 2

## 2019-10-27 MED ORDER — ALPRAZOLAM 0.5 MG PO TABS
0.2500 mg | ORAL_TABLET | Freq: Once | ORAL | Status: AC
  Administered 2019-10-27: 0.25 mg via ORAL
  Filled 2019-10-27: qty 1

## 2019-10-27 MED ORDER — BUPROPION HCL ER (SR) 150 MG PO TB12
150.0000 mg | ORAL_TABLET | Freq: Once | ORAL | Status: AC
  Administered 2019-10-27: 150 mg via ORAL
  Filled 2019-10-27 (×2): qty 1

## 2019-10-27 NOTE — ED Notes (Signed)
Attempted foley. Foley in and urine return. Pt started yelling it hurts. Pt jerked leg removing uninflatted foley. NT angelena assisted

## 2019-10-27 NOTE — ED Notes (Signed)
Urology dr.mckenzie placed 63F foley cath w/40 cc balloon.

## 2019-10-27 NOTE — ED Provider Notes (Signed)
Patient was left to have urology do Foley catheter this morning.  Evidently she has had long-term catheters and trauma to her urethra.  The nursing staff was unable to get a catheter however the nurse tech did get 1 and however the patient immediately pulled it out.  6:44 AM patient was discussed with Dr. Diona Fanti, urologist he suggested putting in a catheter with a 30 cc balloon if we cannot find a 30 cc balloon he states the 10 cc balloon will accommodate 30 cc.  He states if that is unsuccessful Dr. Alyson Ingles will be available after 7 AM for consults.   Rolland Porter, MD 10/27/19 [phone]
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
## 2019-10-28 ENCOUNTER — Other Ambulatory Visit: Payer: Self-pay

## 2019-10-28 ENCOUNTER — Encounter: Payer: Self-pay | Admitting: Urology

## 2019-10-28 ENCOUNTER — Ambulatory Visit (INDEPENDENT_AMBULATORY_CARE_PROVIDER_SITE_OTHER): Payer: Medicaid Other | Admitting: Urology

## 2019-10-28 VITALS — BP 124/84 | HR 109 | Temp 98.3°F | Ht 66.0 in | Wt 240.0 lb

## 2019-10-28 DIAGNOSIS — N3945 Continuous leakage: Secondary | ICD-10-CM | POA: Diagnosis not present

## 2019-10-28 DIAGNOSIS — N3021 Other chronic cystitis with hematuria: Secondary | ICD-10-CM | POA: Diagnosis not present

## 2019-10-28 DIAGNOSIS — N319 Neuromuscular dysfunction of bladder, unspecified: Secondary | ICD-10-CM | POA: Diagnosis not present

## 2019-10-28 MED ORDER — SULFAMETHOXAZOLE-TRIMETHOPRIM 400-80 MG PO TABS: 1.0000 | ORAL_TABLET | Freq: Every day | ORAL | 11 refills | Status: DC

## 2019-10-28 NOTE — Patient Instructions (Signed)
Urinary Incontinence  Urinary incontinence refers to a condition in which a person is unable to control where and when to pass urine. A person with this condition will urinate when he or she does not mean to (involuntarily). What are the causes? This condition may be caused by:  Medicines.  Infections.  Constipation.  Overactive bladder muscles.  Weak bladder muscles.  Weak pelvic floor muscles. These muscles provide support for the bladder, intestine, and, in women, the uterus.  Enlarged prostate in men. The prostate is a gland near the bladder. When it gets too big, it can pinch the urethra. With the urethra blocked, the bladder can weaken and lose the ability to empty properly.  Surgery.  Emotional factors, such as anxiety, stress, or post-traumatic stress disorder (PTSD).  Pelvic organ prolapse. This happens in women when organs shift out of place and into the vagina. This shift can prevent the bladder and urethra from working properly. What increases the risk? The following factors may make you more likely to develop this condition:  Older age.  Obesity and physical inactivity.  Pregnancy and childbirth.  Menopause.  Diseases that affect the nerves or spinal cord (neurological diseases).  Long-term (chronic) coughing. This can increase pressure on the bladder and pelvic floor muscles. What are the signs or symptoms? Symptoms may vary depending on the type of urinary incontinence you have. They include:  A sudden urge to urinate, but passing urine involuntarily before you can get to a bathroom (urge incontinence).  Suddenly passing urine with any activity that forces urine to pass, such as coughing, laughing, exercise, or sneezing (stress incontinence).  Needing to urinate often, but urinating only a small amount, or constantly dribbling urine (overflow incontinence).  Urinating because you cannot get to the bathroom in time due to a physical disability, such as  arthritis or injury, or communication and thinking problems, such as Alzheimer disease (functional incontinence). How is this diagnosed? This condition may be diagnosed based on:  Your medical history.  A physical exam.  Tests, such as: ? Urine tests. ? X-rays of your kidney and bladder. ? Ultrasound. ? CT scan. ? Cystoscopy. In this procedure, a health care provider inserts a tube with a light and camera (cystoscope) through the urethra and into the bladder in order to check for problems. ? Urodynamic testing. These tests assess how well the bladder, urethra, and sphincter can store and release urine. There are different types of urodynamic tests, and they vary depending on what the test is measuring. To help diagnose your condition, your health care provider may recommend that you keep a log of when you urinate and how much you urinate. How is this treated? Treatment for this condition depends on the type of incontinence that you have and its cause. Treatment may include:  Lifestyle changes, such as: ? Quitting smoking. ? Maintaining a healthy weight. ? Staying active. Try to get 150 minutes of moderate-intensity exercise every week. Ask your health care provider which activities are safe for you. ? Eating a healthy diet.  Avoid high-fat foods, like fried foods.  Avoid refined carbohydrates like white bread and white rice.  Limit how much alcohol and caffeine you drink.  Increase your fiber intake. Foods such as fresh fruits, vegetables, beans, and whole grains are healthy sources of fiber.  Pelvic floor muscle exercises.  Bladder training, such as lengthening the amount of time between bathroom breaks, or using the bathroom at regular intervals.  Using techniques to suppress bladder urges.   This can include distraction techniques or controlled breathing exercises.  Medicines to relax the bladder muscles and prevent bladder spasms.  Medicines to help slow or prevent the  growth of a man's prostate.  Botox injections. These can help relax the bladder muscles.  Using pulses of electricity to help change bladder reflexes (electrical nerve stimulation).  For women, using a medical device to prevent urine leaks. This is a small, tampon-like, disposable device that is inserted into the urethra.  Injecting collagen or carbon beads (bulking agents) into the urinary sphincter. These can help thicken tissue and close the bladder opening.  Surgery. Follow these instructions at home: Lifestyle  Limit alcohol and caffeine. These can fill your bladder quickly and irritate it.  Keep yourself clean to help prevent odors and skin damage. Ask your doctor about special skin creams and cleansers that can protect the skin from urine.  Consider wearing pads or adult diapers. Make sure to change them regularly, and always change them right after experiencing incontinence. General instructions  Take over-the-counter and prescription medicines only as told by your health care provider.  Use the bathroom about every 3-4 hours, even if you do not feel the need to urinate. Try to empty your bladder completely every time. After urinating, wait a minute. Then try to urinate again.  Make sure you are in a relaxed position while urinating.  If your incontinence is caused by nerve problems, keep a log of the medicines you take and the times you go to the bathroom.  Keep all follow-up visits as told by your health care provider. This is important. Contact a health care provider if:  You have pain that gets worse.  Your incontinence gets worse. Get help right away if:  You have a fever or chills.  You are unable to urinate.  You have redness in your groin area or down your legs. Summary  Urinary incontinence refers to a condition in which a person is unable to control where and when to pass urine.  This condition may be caused by medicines, infection, weak bladder  muscles, weak pelvic floor muscles, enlargement of the prostate (in men), or surgery.  The following factors increase your risk for developing this condition: older age, obesity, pregnancy and childbirth, menopause, neurological diseases, and chronic coughing.  There are several types of urinary incontinence. They include urge incontinence, stress incontinence, overflow incontinence, and functional incontinence.  This condition is usually treated first with lifestyle and behavioral changes, such as quitting smoking, eating a healthier diet, and doing regular pelvic floor exercises. Other treatment options include medicines, bulking agents, medical devices, electrical nerve stimulation, or surgery. This information is not intended to replace advice given to you by your health care provider. Make sure you discuss any questions you have with your health care provider. Document Revised: 02/27/2017 Document Reviewed: 05/29/2016 Elsevier Patient Education  2020 [employer]  

## 2019-10-28 NOTE — Progress Notes (Signed)
Urological Symptom Review  Patient is experiencing the following symptoms: Blood in urine Urinary tract infection Injury to kidneys/bladder   Review of Systems  Gastrointestinal (upper)  : Nausea  Gastrointestinal (lower) : Negative for lower GI symptoms  Constitutional : Negative for symptoms  Skin: Negative for skin symptoms  Eyes: Negative for eye symptoms  Ear/Nose/Throat : Sore throat  Hematologic/Lymphatic: Negative for Hematologic/Lymphatic symptoms  Cardiovascular : Negative for cardiovascular symptoms  Respiratory : Cough  Endocrine: Negative for endocrine symptoms  Musculoskeletal: Negative for musculoskeletal symptoms  Neurological: Negative for neurological symptoms  Psychologic: Depression Anxiety
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
## 2019-10-30 ENCOUNTER — Encounter (HOSPITAL_COMMUNITY): Payer: Self-pay

## 2019-10-30 ENCOUNTER — Inpatient Hospital Stay (HOSPITAL_COMMUNITY)
Admission: EM | Admit: 2019-10-30 | Discharge: 2019-11-10 | DRG: 871 | Disposition: A | Discharge status: Home / Self Care | Payer: Medicaid Other | Attending: Internal Medicine | Admitting: Internal Medicine

## 2019-10-30 ENCOUNTER — Emergency Department (HOSPITAL_COMMUNITY): Payer: Medicaid Other

## 2019-10-30 ENCOUNTER — Other Ambulatory Visit: Payer: Self-pay

## 2019-10-30 DIAGNOSIS — Z83438 Family history of other disorder of lipoprotein metabolism and other lipidemia: Secondary | ICD-10-CM

## 2019-10-30 DIAGNOSIS — M62838 Other muscle spasm: Secondary | ICD-10-CM | POA: Diagnosis present

## 2019-10-30 DIAGNOSIS — R3981 Functional urinary incontinence: Secondary | ICD-10-CM | POA: Diagnosis not present

## 2019-10-30 DIAGNOSIS — N319 Neuromuscular dysfunction of bladder, unspecified: Secondary | ICD-10-CM | POA: Diagnosis present

## 2019-10-30 DIAGNOSIS — Z800 Family history of malignant neoplasm of digestive organs: Secondary | ICD-10-CM

## 2019-10-30 DIAGNOSIS — N3945 Continuous leakage: Secondary | ICD-10-CM

## 2019-10-30 DIAGNOSIS — Z6841 Body Mass Index (BMI) 40.0 and over, adult: Secondary | ICD-10-CM | POA: Diagnosis not present

## 2019-10-30 DIAGNOSIS — J189 Pneumonia, unspecified organism: Secondary | ICD-10-CM | POA: Diagnosis not present

## 2019-10-30 DIAGNOSIS — Z20822 Contact with and (suspected) exposure to covid-19: Secondary | ICD-10-CM | POA: Diagnosis present

## 2019-10-30 DIAGNOSIS — Z87828 Personal history of other (healed) physical injury and trauma: Secondary | ICD-10-CM

## 2019-10-30 DIAGNOSIS — F329 Major depressive disorder, single episode, unspecified: Secondary | ICD-10-CM | POA: Diagnosis present

## 2019-10-30 DIAGNOSIS — Z823 Family history of stroke: Secondary | ICD-10-CM

## 2019-10-30 DIAGNOSIS — Z7401 Bed confinement status: Secondary | ICD-10-CM

## 2019-10-30 DIAGNOSIS — Z79899 Other long term (current) drug therapy: Secondary | ICD-10-CM

## 2019-10-30 DIAGNOSIS — J9601 Acute respiratory failure with hypoxia: Secondary | ICD-10-CM | POA: Diagnosis present

## 2019-10-30 DIAGNOSIS — F419 Anxiety disorder, unspecified: Secondary | ICD-10-CM | POA: Diagnosis present

## 2019-10-30 DIAGNOSIS — Z833 Family history of diabetes mellitus: Secondary | ICD-10-CM

## 2019-10-30 DIAGNOSIS — M869 Osteomyelitis, unspecified: Secondary | ICD-10-CM | POA: Diagnosis present

## 2019-10-30 DIAGNOSIS — R000 Tachycardia, unspecified: Secondary | ICD-10-CM | POA: Diagnosis present

## 2019-10-30 DIAGNOSIS — F17210 Nicotine dependence, cigarettes, uncomplicated: Secondary | ICD-10-CM | POA: Diagnosis present

## 2019-10-30 DIAGNOSIS — L8990 Pressure ulcer of unspecified site, unspecified stage: Secondary | ICD-10-CM | POA: Diagnosis present

## 2019-10-30 DIAGNOSIS — N3020 Other chronic cystitis without hematuria: Secondary | ICD-10-CM | POA: Diagnosis present

## 2019-10-30 DIAGNOSIS — E876 Hypokalemia: Secondary | ICD-10-CM | POA: Diagnosis not present

## 2019-10-30 DIAGNOSIS — Z79890 Hormone replacement therapy (postmenopausal): Secondary | ICD-10-CM

## 2019-10-30 DIAGNOSIS — Z9689 Presence of other specified functional implants: Secondary | ICD-10-CM | POA: Diagnosis present

## 2019-10-30 DIAGNOSIS — L89154 Pressure ulcer of sacral region, stage 4: Secondary | ICD-10-CM | POA: Diagnosis present

## 2019-10-30 DIAGNOSIS — M545 Low back pain: Secondary | ICD-10-CM | POA: Diagnosis present

## 2019-10-30 DIAGNOSIS — L8945 Pressure ulcer of contiguous site of back, buttock and hip, unstageable: Secondary | ICD-10-CM | POA: Diagnosis not present

## 2019-10-30 DIAGNOSIS — R6520 Severe sepsis without septic shock: Secondary | ICD-10-CM | POA: Diagnosis present

## 2019-10-30 DIAGNOSIS — R32 Unspecified urinary incontinence: Secondary | ICD-10-CM | POA: Diagnosis present

## 2019-10-30 DIAGNOSIS — K592 Neurogenic bowel, not elsewhere classified: Secondary | ICD-10-CM | POA: Diagnosis present

## 2019-10-30 DIAGNOSIS — E039 Hypothyroidism, unspecified: Secondary | ICD-10-CM | POA: Diagnosis present

## 2019-10-30 DIAGNOSIS — D509 Iron deficiency anemia, unspecified: Secondary | ICD-10-CM | POA: Diagnosis present

## 2019-10-30 DIAGNOSIS — K219 Gastro-esophageal reflux disease without esophagitis: Secondary | ICD-10-CM | POA: Diagnosis present

## 2019-10-30 DIAGNOSIS — J181 Lobar pneumonia, unspecified organism: Secondary | ICD-10-CM | POA: Diagnosis present

## 2019-10-30 DIAGNOSIS — I959 Hypotension, unspecified: Secondary | ICD-10-CM | POA: Diagnosis present

## 2019-10-30 DIAGNOSIS — Z993 Dependence on wheelchair: Secondary | ICD-10-CM

## 2019-10-30 DIAGNOSIS — G629 Polyneuropathy, unspecified: Secondary | ICD-10-CM | POA: Diagnosis present

## 2019-10-30 DIAGNOSIS — G8220 Paraplegia, unspecified: Secondary | ICD-10-CM | POA: Diagnosis present

## 2019-10-30 DIAGNOSIS — A419 Sepsis, unspecified organism: Secondary | ICD-10-CM | POA: Diagnosis not present

## 2019-10-30 DIAGNOSIS — Z8249 Family history of ischemic heart disease and other diseases of the circulatory system: Secondary | ICD-10-CM

## 2019-10-30 DIAGNOSIS — Z8349 Family history of other endocrine, nutritional and metabolic diseases: Secondary | ICD-10-CM

## 2019-10-30 DIAGNOSIS — Z8049 Family history of malignant neoplasm of other genital organs: Secondary | ICD-10-CM

## 2019-10-30 DIAGNOSIS — K029 Dental caries, unspecified: Secondary | ICD-10-CM | POA: Diagnosis present

## 2019-10-30 HISTORY — DX: Pneumonia, unspecified organism: J18.9

## 2019-10-30 LAB — URINALYSIS, ROUTINE W REFLEX MICROSCOPIC
Bilirubin Urine: NEGATIVE
Glucose, UA: NEGATIVE mg/dL
Ketones, ur: NEGATIVE mg/dL
Nitrite: NEGATIVE
Protein, ur: NEGATIVE mg/dL
Specific Gravity, Urine: 1.002 — ABNORMAL LOW (ref 1.005–1.030)
pH: 7 (ref 5.0–8.0)

## 2019-10-30 LAB — POC URINE PREG, ED: Preg Test, Ur: NEGATIVE

## 2019-10-30 LAB — SARS CORONAVIRUS 2 BY RT PCR (HOSPITAL ORDER, PERFORMED IN ~~LOC~~ HOSPITAL LAB): SARS Coronavirus 2: NEGATIVE

## 2019-10-30 LAB — CBC WITH DIFFERENTIAL/PLATELET
Abs Immature Granulocytes: 0.07 10*3/uL (ref 0.00–0.07)
Basophils Absolute: 0.1 10*3/uL (ref 0.0–0.1)
Basophils Relative: 0 %
Eosinophils Absolute: 0.7 10*3/uL — ABNORMAL HIGH (ref 0.0–0.5)
Eosinophils Relative: 3 %
HCT: 40.6 % (ref 36.0–46.0)
Hemoglobin: 12.8 g/dL (ref 12.0–15.0)
Immature Granulocytes: 0 %
Lymphocytes Relative: 16 %
Lymphs Abs: 3 10*3/uL (ref 0.7–4.0)
MCH: 28.3 pg (ref 26.0–34.0)
MCHC: 31.5 g/dL (ref 30.0–36.0)
MCV: 89.6 fL (ref 80.0–100.0)
Monocytes Absolute: 1.1 10*3/uL — ABNORMAL HIGH (ref 0.1–1.0)
Monocytes Relative: 6 %
Neutro Abs: 14.6 10*3/uL — ABNORMAL HIGH (ref 1.7–7.7)
Neutrophils Relative %: 75 %
Platelets: 531 10*3/uL — ABNORMAL HIGH (ref 150–400)
RBC: 4.53 MIL/uL (ref 3.87–5.11)
RDW: 17.2 % — ABNORMAL HIGH (ref 11.5–15.5)
WBC: 19.5 10*3/uL — ABNORMAL HIGH (ref 4.0–10.5)
nRBC: 0 % (ref 0.0–0.2)

## 2019-10-30 LAB — TROPONIN I (HIGH SENSITIVITY)
Troponin I (High Sensitivity): 2 ng/L (ref <18)
Troponin I (High Sensitivity): 3 ng/L (ref <18)

## 2019-10-30 LAB — BASIC METABOLIC PANEL
Anion gap: 11 (ref 5–15)
BUN: 6 mg/dL (ref 6–20)
CO2: 24 mmol/L (ref 22–32)
Calcium: 8.5 mg/dL — ABNORMAL LOW (ref 8.9–10.3)
Chloride: 102 mmol/L (ref 98–111)
Creatinine, Ser: 0.6 mg/dL (ref 0.44–1.00)
GFR calc Af Amer: >60 mL/min (ref >60)
GFR calc non Af Amer: >60 mL/min (ref >60)
Glucose, Bld: 132 mg/dL — ABNORMAL HIGH (ref 70–99)
Potassium: 3.6 mmol/L (ref 3.5–5.1)
Sodium: 137 mmol/L (ref 135–145)

## 2019-10-30 LAB — STREP PNEUMONIAE URINARY ANTIGEN: Strep Pneumo Urinary Antigen: NEGATIVE

## 2019-10-30 LAB — LACTIC ACID, PLASMA
Lactic Acid, Venous: 2.2 mmol/L (ref 0.5–1.9)
Lactic Acid, Venous: 2.4 mmol/L (ref 0.5–1.9)
Lactic Acid, Venous: 0.6 mmol/L (ref 0.5–1.9)

## 2019-10-30 LAB — PROTIME-INR
INR: 1.2 (ref 0.8–1.2)
Prothrombin Time: 15.1 seconds (ref 11.4–15.2)

## 2019-10-30 LAB — APTT: aPTT: 43 seconds — ABNORMAL HIGH (ref 24–36)

## 2019-10-30 MED ORDER — VITAMIN B-12 1000 MCG PO TABS
1000.0000 ug | ORAL_TABLET | Freq: Every day | ORAL | Status: DC
  Administered 2019-10-30 – 2019-11-10 (×12): 1000 ug via ORAL
  Filled 2019-10-30 (×12): qty 1

## 2019-10-30 MED ORDER — ASCORBIC ACID 500 MG PO TABS
500.0000 mg | ORAL_TABLET | Freq: Every day | ORAL | Status: DC
  Administered 2019-10-30 – 2019-11-10 (×12): 500 mg via ORAL
  Filled 2019-10-30 (×12): qty 1

## 2019-10-30 MED ORDER — SODIUM CHLORIDE 0.9 % IV SOLN: 2.0000 g | INTRAVENOUS | Status: DC

## 2019-10-30 MED ORDER — VITAMIN D 25 MCG (1000 UNIT) PO TABS
2000.0000 [IU] | ORAL_TABLET | Freq: Every day | ORAL | Status: DC
  Administered 2019-10-30 – 2019-11-10 (×12): 2000 [IU] via ORAL
  Filled 2019-10-30 (×21): qty 2

## 2019-10-30 MED ORDER — SODIUM CHLORIDE 0.9 % IV BOLUS
500.0000 mL | Freq: Once | INTRAVENOUS | Status: AC
  Administered 2019-10-30: 500 mL via INTRAVENOUS

## 2019-10-30 MED ORDER — ALBUTEROL SULFATE HFA 108 (90 BASE) MCG/ACT IN AERS
2.0000 | INHALATION_SPRAY | Freq: Once | RESPIRATORY_TRACT | Status: AC
  Administered 2019-10-30: 2 via RESPIRATORY_TRACT
  Filled 2019-10-30: qty 6.7

## 2019-10-30 MED ORDER — BENZONATATE 100 MG PO CAPS
200.0000 mg | ORAL_CAPSULE | Freq: Three times a day (TID) | ORAL | Status: DC | PRN
  Administered 2019-10-30 – 2019-11-03 (×6): 200 mg via ORAL
  Filled 2019-10-30 (×6): qty 2

## 2019-10-30 MED ORDER — LEVOTHYROXINE SODIUM 88 MCG PO TABS
88.0000 ug | ORAL_TABLET | Freq: Every day | ORAL | Status: DC
  Administered 2019-10-31 – 2019-11-10 (×11): 88 ug via ORAL
  Filled 2019-10-30 (×13): qty 1

## 2019-10-30 MED ORDER — FENTANYL CITRATE (PF) 100 MCG/2ML IJ SOLN
100.0000 ug | Freq: Once | INTRAMUSCULAR | Status: AC
  Administered 2019-10-30: 100 ug via INTRAVENOUS
  Filled 2019-10-30: qty 2

## 2019-10-30 MED ORDER — SULFAMETHOXAZOLE-TRIMETHOPRIM 800-160 MG PO TABS
0.5000 | ORAL_TABLET | Freq: Every day | ORAL | Status: DC
  Administered 2019-10-30 – 2019-11-10 (×12): 0.5 via ORAL
  Filled 2019-10-30 (×12): qty 1

## 2019-10-30 MED ORDER — LACTATED RINGERS IV BOLUS
1000.0000 mL | Freq: Once | INTRAVENOUS | Status: AC
  Administered 2019-10-30: 1000 mL via INTRAVENOUS

## 2019-10-30 MED ORDER — ENOXAPARIN SODIUM 40 MG/0.4ML ~~LOC~~ SOLN
40.0000 mg | SUBCUTANEOUS | Status: DC
  Administered 2019-10-30: 40 mg via SUBCUTANEOUS

## 2019-10-30 MED ORDER — ONDANSETRON HCL 4 MG/2ML IJ SOLN: 4.0000 mg | Freq: Four times a day (QID) | INTRAMUSCULAR | Status: DC | PRN

## 2019-10-30 MED ORDER — PROMETHAZINE-CODEINE 6.25-10 MG/5ML PO SYRP
5.0000 mL | ORAL_SOLUTION | ORAL | Status: DC | PRN
Start: 2019-10-30 — End: 2019-10-30

## 2019-10-30 MED ORDER — SODIUM CHLORIDE 0.9 % IV SOLN: 500.0000 mg | INTRAVENOUS | Status: DC

## 2019-10-30 MED ORDER — ACETAMINOPHEN 500 MG PO TABS: 1000.0000 mg | ORAL_TABLET | Freq: Every day | ORAL | Status: DC | PRN

## 2019-10-30 MED ORDER — PANTOPRAZOLE SODIUM 40 MG PO TBEC
40.0000 mg | DELAYED_RELEASE_TABLET | Freq: Every day | ORAL | Status: DC
  Administered 2019-10-30 – 2019-11-10 (×12): 40 mg via ORAL
  Filled 2019-10-30 (×12): qty 1

## 2019-10-30 MED ORDER — CITALOPRAM HYDROBROMIDE 20 MG PO TABS
40.0000 mg | ORAL_TABLET | Freq: Every day | ORAL | Status: DC
  Administered 2019-10-30 – 2019-11-10 (×12): 40 mg via ORAL
  Filled 2019-10-30 (×4): qty 2
  Filled 2019-10-30: qty 1
  Filled 2019-10-30 (×3): qty 2
  Filled 2019-10-30: qty 1
  Filled 2019-10-30 (×4): qty 2

## 2019-10-30 MED ORDER — POLYVINYL ALCOHOL 1.4 % OP SOLN
1.0000 [drp] | OPHTHALMIC | Status: DC | PRN
  Administered 2019-10-30 – 2019-11-03 (×2): 1 [drp] via OPHTHALMIC
  Filled 2019-10-30 (×3): qty 15

## 2019-10-30 MED ORDER — LACTATED RINGERS IV BOLUS: 1000.0000 mL | Freq: Once | INTRAVENOUS | Status: DC

## 2019-10-30 MED ORDER — HYDROCOD POLST-CPM POLST ER 10-8 MG/5ML PO SUER
5.0000 mL | Freq: Once | ORAL | Status: AC
  Administered 2019-10-30: 5 mL via ORAL
  Filled 2019-10-30: qty 5

## 2019-10-30 MED ORDER — ENOXAPARIN SODIUM 60 MG/0.6ML ~~LOC~~ SOLN
55.0000 mg | SUBCUTANEOUS | Status: DC
  Administered 2019-10-31 – 2019-11-09 (×10): 55 mg via SUBCUTANEOUS
  Filled 2019-10-30 (×11): qty 0.6

## 2019-10-30 MED ORDER — ACETAMINOPHEN 325 MG PO TABS: 650.0000 mg | ORAL_TABLET | Freq: Four times a day (QID) | ORAL | Status: DC | PRN

## 2019-10-30 MED ORDER — ACETAMINOPHEN 650 MG RE SUPP: 650.0000 mg | Freq: Four times a day (QID) | RECTAL | Status: DC | PRN

## 2019-10-30 MED ORDER — ONDANSETRON HCL 4 MG PO TABS: 4.0000 mg | ORAL_TABLET | Freq: Four times a day (QID) | ORAL | Status: DC | PRN

## 2019-10-30 MED ORDER — LACTATED RINGERS IV BOLUS (SEPSIS)
1000.0000 mL | Freq: Once | INTRAVENOUS | Status: AC
  Administered 2019-10-30: 1000 mL via INTRAVENOUS

## 2019-10-30 MED ORDER — OXYCODONE HCL 5 MG PO TABS
15.0000 mg | ORAL_TABLET | Freq: Four times a day (QID) | ORAL | Status: DC | PRN
  Administered 2019-10-30 – 2019-11-10 (×43): 15 mg via ORAL
  Filled 2019-10-30 (×44): qty 3

## 2019-10-30 MED ORDER — ENOXAPARIN SODIUM 40 MG/0.4ML ~~LOC~~ SOLN
40.0000 mg | SUBCUTANEOUS | Status: DC
  Filled 2019-10-30: qty 0.4

## 2019-10-30 MED ORDER — SODIUM CHLORIDE 0.9 % IV SOLN
2.0000 g | INTRAVENOUS | Status: DC
  Administered 2019-10-30 – 2019-11-06 (×8): 2 g via INTRAVENOUS
  Filled 2019-10-30 (×8): qty 20

## 2019-10-30 MED ORDER — BENZONATATE 100 MG PO CAPS
100.0000 mg | ORAL_CAPSULE | Freq: Once | ORAL | Status: AC
  Administered 2019-10-30: 100 mg via ORAL
  Filled 2019-10-30: qty 1

## 2019-10-30 MED ORDER — HYDROCOD POLST-CPM POLST ER 10-8 MG/5ML PO SUER
5.0000 mL | Freq: Two times a day (BID) | ORAL | Status: DC
  Administered 2019-10-30 – 2019-11-10 (×22): 5 mL via ORAL
  Filled 2019-10-30 (×22): qty 5

## 2019-10-30 MED ORDER — ALPRAZOLAM 0.25 MG PO TABS
0.2500 mg | ORAL_TABLET | Freq: Two times a day (BID) | ORAL | Status: DC | PRN
  Administered 2019-10-30 – 2019-11-07 (×7): 0.25 mg via ORAL
  Filled 2019-10-30 (×7): qty 1

## 2019-10-30 MED ORDER — FERROUS SULFATE 325 (65 FE) MG PO TABS
325.0000 mg | ORAL_TABLET | Freq: Every day | ORAL | Status: DC
  Administered 2019-10-30 – 2019-11-10 (×12): 325 mg via ORAL
  Filled 2019-10-30 (×13): qty 1

## 2019-10-30 MED ORDER — LACTATED RINGERS IV BOLUS (SEPSIS)
250.0000 mL | Freq: Once | INTRAVENOUS | Status: DC
  Administered 2019-10-30: 250 mL via INTRAVENOUS

## 2019-10-30 MED ORDER — OXYBUTYNIN CHLORIDE ER 5 MG PO TB24
5.0000 mg | ORAL_TABLET | Freq: Every day | ORAL | Status: DC
  Administered 2019-10-30 – 2019-11-10 (×12): 5 mg via ORAL
  Filled 2019-10-30 (×13): qty 1

## 2019-10-30 MED ORDER — LACTATED RINGERS IV SOLN: INTRAVENOUS | Status: AC

## 2019-10-30 MED ORDER — GABAPENTIN 300 MG PO CAPS
600.0000 mg | ORAL_CAPSULE | Freq: Three times a day (TID) | ORAL | Status: DC
  Administered 2019-10-30 – 2019-11-10 (×35): 600 mg via ORAL
  Filled 2019-10-30 (×35): qty 2

## 2019-10-30 MED ORDER — BUPROPION HCL ER (SR) 150 MG PO TB12
150.0000 mg | ORAL_TABLET | Freq: Two times a day (BID) | ORAL | Status: DC
  Administered 2019-10-30 – 2019-11-10 (×23): 150 mg via ORAL
  Filled 2019-10-30 (×24): qty 1

## 2019-10-30 MED ORDER — SODIUM CHLORIDE 0.9 % IV SOLN
500.0000 mg | INTRAVENOUS | Status: DC
  Administered 2019-10-30 – 2019-11-02 (×4): 500 mg via INTRAVENOUS
  Filled 2019-10-30 (×4): qty 500

## 2019-10-30 MED ORDER — BUSPIRONE HCL 5 MG PO TABS
15.0000 mg | ORAL_TABLET | Freq: Two times a day (BID) | ORAL | Status: DC
  Administered 2019-10-30 – 2019-11-10 (×23): 15 mg via ORAL
  Filled 2019-10-30 (×23): qty 3

## 2019-10-30 MED ORDER — IPRATROPIUM-ALBUTEROL 0.5-2.5 (3) MG/3ML IN SOLN: 3.0000 mL | RESPIRATORY_TRACT | Status: DC | PRN

## 2019-10-30 NOTE — Progress Notes (Signed)
[facility] night shift.  Wound care team requested pictures of affected area. Please see pictures of sacral pressure ulcer below.      Tennis Must, MD

## 2019-10-30 NOTE — ED Notes (Signed)
Waiting on pharmacy to verify the rest of the medications ordered for patient
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
## 2019-10-30 NOTE — ED Notes (Signed)
CRITICAL VALUE ALERT  Critical Value:  Lactic acid 2.2  Date & Time Notied:  10/30/2019 0532  Provider Notified: Dr. Wyvonnia Dusky  Orders Received/Actions taken: see chart

## 2019-10-30 NOTE — ED Notes (Signed)
Pt leaking large amounts of urine around catheter- catheter removed -purewick placed on pt. Dr Wyvonnia Dusky aware.

## 2019-10-30 NOTE — ED Triage Notes (Signed)
Pt brought in by EMS for SOB and cough that is getting worse. Pt was dx PNA here a few days ago.
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
## 2019-10-30 NOTE — Progress Notes (Signed)
Notified provider of need to document reason for last amt fluid resus cancelled. Though slightly beyond protocol window, provider ordering additional bolus.  Environment in ED quite chaotic w/ pandemic surge influence.

## 2019-10-30 NOTE — ED Notes (Signed)
Blood cultures drawn.

## 2019-10-31 ENCOUNTER — Other Ambulatory Visit: Payer: Self-pay | Admitting: Family Medicine

## 2019-10-31 ENCOUNTER — Ambulatory Visit (HOSPITAL_COMMUNITY): Admission: RE | Admit: 2019-10-31 | Payer: Medicaid Other | Source: Ambulatory Visit

## 2019-10-31 DIAGNOSIS — F410 Panic disorder [episodic paroxysmal anxiety] without agoraphobia: Secondary | ICD-10-CM

## 2019-10-31 DIAGNOSIS — Z720 Tobacco use: Secondary | ICD-10-CM

## 2019-10-31 DIAGNOSIS — F411 Generalized anxiety disorder: Secondary | ICD-10-CM

## 2019-10-31 LAB — COMPREHENSIVE METABOLIC PANEL
ALT: 19 U/L (ref 0–44)
AST: 14 U/L — ABNORMAL LOW (ref 15–41)
Albumin: 2.6 g/dL — ABNORMAL LOW (ref 3.5–5.0)
Alkaline Phosphatase: 90 U/L (ref 38–126)
Anion gap: 8 (ref 5–15)
BUN: 7 mg/dL (ref 6–20)
CO2: 25 mmol/L (ref 22–32)
Calcium: 8 mg/dL — ABNORMAL LOW (ref 8.9–10.3)
Chloride: 103 mmol/L (ref 98–111)
Creatinine, Ser: 0.49 mg/dL (ref 0.44–1.00)
GFR calc Af Amer: >60 mL/min (ref >60)
GFR calc non Af Amer: >60 mL/min (ref >60)
Glucose, Bld: 84 mg/dL (ref 70–99)
Potassium: 3.2 mmol/L — ABNORMAL LOW (ref 3.5–5.1)
Sodium: 136 mmol/L (ref 135–145)
Total Bilirubin: 0.4 mg/dL (ref 0.3–1.2)
Total Protein: 6.1 g/dL — ABNORMAL LOW (ref 6.5–8.1)

## 2019-10-31 LAB — CBC WITH DIFFERENTIAL/PLATELET
Abs Immature Granulocytes: 0.04 10*3/uL (ref 0.00–0.07)
Basophils Absolute: 0 10*3/uL (ref 0.0–0.1)
Basophils Relative: 0 %
Eosinophils Absolute: 0.6 10*3/uL — ABNORMAL HIGH (ref 0.0–0.5)
Eosinophils Relative: 5 %
HCT: 35.7 % — ABNORMAL LOW (ref 36.0–46.0)
Hemoglobin: 10.8 g/dL — ABNORMAL LOW (ref 12.0–15.0)
Immature Granulocytes: 0 %
Lymphocytes Relative: 22 %
Lymphs Abs: 2.5 10*3/uL (ref 0.7–4.0)
MCH: 27.6 pg (ref 26.0–34.0)
MCHC: 30.3 g/dL (ref 30.0–36.0)
MCV: 91.3 fL (ref 80.0–100.0)
Monocytes Absolute: 0.7 10*3/uL (ref 0.1–1.0)
Monocytes Relative: 6 %
Neutro Abs: 7.7 10*3/uL (ref 1.7–7.7)
Neutrophils Relative %: 67 %
Platelets: 438 10*3/uL — ABNORMAL HIGH (ref 150–400)
RBC: 3.91 MIL/uL (ref 3.87–5.11)
RDW: 16.9 % — ABNORMAL HIGH (ref 11.5–15.5)
WBC: 11.6 10*3/uL — ABNORMAL HIGH (ref 4.0–10.5)
nRBC: 0 % (ref 0.0–0.2)

## 2019-10-31 LAB — URINE CULTURE

## 2019-10-31 LAB — MAGNESIUM: Magnesium: 2 mg/dL (ref 1.7–2.4)

## 2019-10-31 LAB — HIV ANTIBODY (ROUTINE TESTING W REFLEX): HIV Screen 4th Generation wRfx: NONREACTIVE

## 2019-10-31 LAB — LACTIC ACID, PLASMA: Lactic Acid, Venous: 0.8 mmol/L (ref 0.5–1.9)

## 2019-10-31 MED ORDER — GUAIFENESIN-DM 100-10 MG/5ML PO SYRP
5.0000 mL | ORAL_SOLUTION | ORAL | Status: DC | PRN
  Administered 2019-11-02 – 2019-11-04 (×4): 5 mL via ORAL
  Filled 2019-10-31 (×4): qty 5

## 2019-10-31 MED ORDER — BUDESONIDE 0.25 MG/2ML IN SUSP
0.2500 mg | Freq: Two times a day (BID) | RESPIRATORY_TRACT | Status: DC
  Administered 2019-10-31 – 2019-11-10 (×18): 0.25 mg via RESPIRATORY_TRACT
  Filled 2019-10-31 (×20): qty 2

## 2019-10-31 MED ORDER — LIDOCAINE 5 % EX PTCH
1.0000 | MEDICATED_PATCH | CUTANEOUS | Status: DC
  Administered 2019-10-31 – 2019-11-10 (×10): 1 via TRANSDERMAL
  Filled 2019-10-31 (×10): qty 1

## 2019-10-31 MED ORDER — POTASSIUM CHLORIDE CRYS ER 20 MEQ PO TBCR
40.0000 meq | EXTENDED_RELEASE_TABLET | Freq: Once | ORAL | Status: AC
  Administered 2019-10-31: 40 meq via ORAL
  Filled 2019-10-31: qty 2

## 2019-10-31 NOTE — Consult Note (Addendum)
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 Patient is known to our team from previous admissions. Patient performs daily self care under the direction of an outpatient wound care center (Dr. Violeta Gelinas); patient has been followed at that center for >3 years. Last seen in that setting by that provider on 10/11/19.  Appreciate photo taken and uploaded into EMR by Dr. Olevia Bowens today. Wound type: Pressure and shear plus infection (osteomyelitis) Pressure Injury POA: Yes Measurement: Last taken at the wound care center on 10/11/19 as 3cm x 2.5cm x 2.8cm.  Bedside RN to obtain measurements today prior to performing wound care. Wound bed: pink, moist Drainage (amount, consistency, odor) moderate amountt thin serous to light yellow exudate Periwound: intact with evidence of wound edge rolling (epibole). Mild maceration. Dressing procedure/placement/frequency:Patient has been using an antimicrobial wound filler (Aquacel Advantage) and I will continue the therapy here. Patient is provided with  Mattress replacement with low air loss feature; she is often, but not always able to turn and reposition herself off of the affected area.   [facility] nursing team will not follow, but will remain available to this patient, the nursing and medical teams.  Please re-consult if needed. Thanks, Maudie Flakes, MSN, RN, [place], Arther Abbott  Pager# [phone]
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
## 2019-11-01 LAB — CBC WITH DIFFERENTIAL/PLATELET
Abs Immature Granulocytes: 0.03 10*3/uL (ref 0.00–0.07)
Basophils Absolute: 0 10*3/uL (ref 0.0–0.1)
Basophils Relative: 0 %
Eosinophils Absolute: 0.5 10*3/uL (ref 0.0–0.5)
Eosinophils Relative: 6 %
HCT: 37.9 % (ref 36.0–46.0)
Hemoglobin: 11.4 g/dL — ABNORMAL LOW (ref 12.0–15.0)
Immature Granulocytes: 0 %
Lymphocytes Relative: 26 %
Lymphs Abs: 2.1 10*3/uL (ref 0.7–4.0)
MCH: 27.9 pg (ref 26.0–34.0)
MCHC: 30.1 g/dL (ref 30.0–36.0)
MCV: 92.7 fL (ref 80.0–100.0)
Monocytes Absolute: 0.6 10*3/uL (ref 0.1–1.0)
Monocytes Relative: 8 %
Neutro Abs: 4.6 10*3/uL (ref 1.7–7.7)
Neutrophils Relative %: 60 %
Platelets: 485 10*3/uL — ABNORMAL HIGH (ref 150–400)
RBC: 4.09 MIL/uL (ref 3.87–5.11)
RDW: 16.7 % — ABNORMAL HIGH (ref 11.5–15.5)
WBC: 7.8 10*3/uL (ref 4.0–10.5)
nRBC: 0 % (ref 0.0–0.2)

## 2019-11-01 LAB — BASIC METABOLIC PANEL
Anion gap: 10 (ref 5–15)
BUN: 6 mg/dL (ref 6–20)
CO2: 28 mmol/L (ref 22–32)
Calcium: 8.4 mg/dL — ABNORMAL LOW (ref 8.9–10.3)
Chloride: 102 mmol/L (ref 98–111)
Creatinine, Ser: 0.43 mg/dL — ABNORMAL LOW (ref 0.44–1.00)
GFR calc Af Amer: >60 mL/min (ref >60)
GFR calc non Af Amer: >60 mL/min (ref >60)
Glucose, Bld: 105 mg/dL — ABNORMAL HIGH (ref 70–99)
Potassium: 3.4 mmol/L — ABNORMAL LOW (ref 3.5–5.1)
Sodium: 140 mmol/L (ref 135–145)

## 2019-11-01 LAB — LACTIC ACID, PLASMA: Lactic Acid, Venous: 1.1 mmol/L (ref 0.5–1.9)

## 2019-11-01 LAB — MAGNESIUM: Magnesium: 2.1 mg/dL (ref 1.7–2.4)

## 2019-11-01 MED ORDER — POTASSIUM CHLORIDE CRYS ER 20 MEQ PO TBCR
40.0000 meq | EXTENDED_RELEASE_TABLET | Freq: Once | ORAL | Status: AC
  Administered 2019-11-01: 40 meq via ORAL
  Filled 2019-11-01: qty 2

## 2019-11-01 NOTE — Plan of Care (Signed)

## 2019-11-01 NOTE — TOC Initial Note (Signed)
Transition of Care [name]) - Initial/Assessment Note    Patient Details  Name: [name] MRN: [id number] Date of Birth: Jun 21, 1980  [facility]) CM/SW Contact:    Ihor Gully, LCSW Phone Number: 11/01/2019, 4:32 PM  Clinical Narrative:                 Patient in need of Pure Wick for home use. Patient's urology office LPN, [facility], is agreeable to initiate prior authorization in an attempt to get Medicaid to cover device. Device will require prior authorization as it is not covered through Medicaid.   Expected Discharge Plan: Home/Self Care Barriers to Discharge: Other (comment) (trying to get pure wick)   Patient Goals and CMS Choice Patient states their goals for this hospitalization and ongoing recovery are:: return home at discharge      Expected Discharge Plan and Services Expected Discharge Plan: Home/Self Care       Living arrangements for the past 2 months: Single Family Home                                      Prior Living Arrangements/Services Living arrangements for the past 2 months: Single Family Home Lives with:: Parents Patient language and need for interpreter reviewed:: Yes Do you feel safe going back to the place where you live?: Yes      Need for Family Participation in Patient Care: Yes (Comment) Care giver support system in place?: Yes (comment)   Criminal Activity/Legal Involvement Pertinent to Current Situation/Hospitalization: No - Comment as needed  Activities of Daily Living      Permission Sought/Granted Permission sought to share information with : Family Supports, Other (comment) Permission granted to share information with : Yes, Release of Information Signed  Share Information with NAME: Mother, [place]rik [place]; Urologist office           Emotional Assessment Appearance:: Appears stated age     Orientation: : Oriented to Place, Oriented to Self, Oriented to  Time, Oriented to Situation Alcohol  / Substance Use: Not Applicable Psych Involvement: No (comment)  Admission diagnosis:  Sepsis due to pneumonia ([place]) [J18.9, A41.9] Sepsis with acute hypoxic respiratory failure without septic shock, due to unspecified organism ([place]) [A41.9, R65.20, J96.01] Patient Active Problem List   Diagnosis Date Noted  . Sepsis due to pneumonia ([place]) 10/30/2019  . Chronic cystitis with hematuria 10/28/2019  . Continuous leakage of urine 10/28/2019  . Generalized anxiety disorder 07/04/2019  . Panic attacks 07/04/2019  . Controlled substance agreement signed 07/04/2019  . Urinary catheter in place 07/04/2019  . Mixed hyperlipidemia 07/04/2019  . Neurogenic bladder 07/04/2019  . Tobacco use 07/04/2019  . Wound infection after surgery 01/07/2019  . Pressure ulcer   . Osteomyelitis of vertebra, sacral and sacrococcygeal region ([place])   . GERD (gastroesophageal reflux disease)   . Protein-calorie malnutrition, severe ([place]) 11/07/2018  . Hypokalemia 05/04/2018  . Chronic pain 05/04/2018  . Tinea   . 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 Sacral osteomyelitis ([place])   . Pressure injury of skin 06/11/2016  . Normocytic anemia 06/10/2016  . 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 L1 vertebral fracture ([place]) 04/07/2016  . Depression with anxiety 06/06/2013  . Dysmenorrhea 08/09/2012  . Irregular menstrual  cycle 08/09/2012  . Shoulder pain, left 11/07/2011  . H/O vitamin D deficiency 09/02/2011  . Obesity 08/06/2010  . Hypothyroidism 08/06/2010  . Recurrent depression ([place]) 08/06/2010  . Smoking 08/06/2010  . Mental retardation, mild (I.Q. 50-70) 08/06/2010   PCP:  Loman Brooklyn, [name] Pharmacy:   [id number] [place], [place] [address] Phone:  [phone] Fax: [place], [place] [address] Phone: [phone] Fax: [phone]     Social Determinants of Health (SDOH) Interventions    Readmission Risk Interventions Readmission Risk Prevention Plan 01/12/2019 01/12/2019 11/12/2018  Transportation Screening (No Data) Complete -  PCP or Specialist Appt within 3-5 Days - Complete Not Complete  HRI or [place] - Complete -  Social Work Consult for [place] Planning/Counseling - Complete -  Palliative Care Screening - Not Applicable Not Applicable  Medication Review Press photographer) - Complete -  Some recent data might be hidden
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
## 2019-11-02 DIAGNOSIS — L8945 Pressure ulcer of contiguous site of back, buttock and hip, unstageable: Secondary | ICD-10-CM

## 2019-11-02 LAB — BASIC METABOLIC PANEL
Anion gap: 12 (ref 5–15)
BUN: 7 mg/dL (ref 6–20)
CO2: 27 mmol/L (ref 22–32)
Calcium: 8.7 mg/dL — ABNORMAL LOW (ref 8.9–10.3)
Chloride: 99 mmol/L (ref 98–111)
Creatinine, Ser: 0.53 mg/dL (ref 0.44–1.00)
GFR calc Af Amer: >60 mL/min (ref >60)
GFR calc non Af Amer: >60 mL/min (ref >60)
Glucose, Bld: 71 mg/dL (ref 70–99)
Potassium: 3.7 mmol/L (ref 3.5–5.1)
Sodium: 138 mmol/L (ref 135–145)

## 2019-11-02 LAB — CBC WITH DIFFERENTIAL/PLATELET
Abs Immature Granulocytes: 0.02 10*3/uL (ref 0.00–0.07)
Basophils Absolute: 0 10*3/uL (ref 0.0–0.1)
Basophils Relative: 1 %
Eosinophils Absolute: 0.4 10*3/uL (ref 0.0–0.5)
Eosinophils Relative: 6 %
HCT: 36.9 % (ref 36.0–46.0)
Hemoglobin: 11.2 g/dL — ABNORMAL LOW (ref 12.0–15.0)
Immature Granulocytes: 0 %
Lymphocytes Relative: 35 %
Lymphs Abs: 2.6 10*3/uL (ref 0.7–4.0)
MCH: 27.3 pg (ref 26.0–34.0)
MCHC: 30.4 g/dL (ref 30.0–36.0)
MCV: 89.8 fL (ref 80.0–100.0)
Monocytes Absolute: 0.7 10*3/uL (ref 0.1–1.0)
Monocytes Relative: 9 %
Neutro Abs: 3.6 10*3/uL (ref 1.7–7.7)
Neutrophils Relative %: 49 %
Platelets: 480 10*3/uL — ABNORMAL HIGH (ref 150–400)
RBC: 4.11 MIL/uL (ref 3.87–5.11)
RDW: 16.3 % — ABNORMAL HIGH (ref 11.5–15.5)
WBC: 7.3 10*3/uL (ref 4.0–10.5)
nRBC: 0 % (ref 0.0–0.2)

## 2019-11-02 LAB — LACTIC ACID, PLASMA: Lactic Acid, Venous: 0.8 mmol/L (ref 0.5–1.9)

## 2019-11-02 LAB — MAGNESIUM: Magnesium: 2.2 mg/dL (ref 1.7–2.4)

## 2019-11-02 MED ORDER — AZITHROMYCIN 250 MG PO TABS
500.0000 mg | ORAL_TABLET | Freq: Every day | ORAL | Status: DC
  Administered 2019-11-03 – 2019-11-06 (×4): 500 mg via ORAL
  Filled 2019-11-02 (×4): qty 2

## 2019-11-02 NOTE — TOC Progression Note (Addendum)
Transition of Care [facility]) - Progression Note    Patient Details  Name: [name] MRN: [id number] Date of Birth: 06-05-1980  Transition of Care [facility] CM/SW Contact  Ihor Gully, LCSW Phone Number: 11/02/2019, 12:24 PM  Clinical Narrative:    Urology office RN started prior auth for [place]. Patient's mother informed of prior auth necessity. Per [place], the initial order code is K1006 and the reorder code is [phone].   Expected Discharge Plan: Home/Self Care Barriers to Discharge: Other (comment) (trying to get pure wick)  Expected Discharge Plan and Services Expected Discharge Plan: Home/Self Care       Living arrangements for the past 2 months: Single Family Home                                       Social Determinants of Health (SDOH) Interventions    Readmission Risk Interventions Readmission Risk Prevention Plan 01/12/2019 01/12/2019 11/12/2018  Transportation Screening (No Data) Complete -  PCP or Specialist Appt within 3-5 Days - Complete Not Complete  HRI or [place] - Complete -  Social Work Consult for [place] Planning/Counseling - Complete -  Palliative Care Screening - Not Applicable Not Applicable  Medication Review (RN Care Manager) - Complete -  Some recent data might be hidden
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
## 2019-11-03 LAB — CBC WITH DIFFERENTIAL/PLATELET
Abs Immature Granulocytes: 0.03 10*3/uL (ref 0.00–0.07)
Basophils Absolute: 0 10*3/uL (ref 0.0–0.1)
Basophils Relative: 1 %
Eosinophils Absolute: 0.5 10*3/uL (ref 0.0–0.5)
Eosinophils Relative: 6 %
HCT: 39.4 % (ref 36.0–46.0)
Hemoglobin: 12.1 g/dL (ref 12.0–15.0)
Immature Granulocytes: 0 %
Lymphocytes Relative: 31 %
Lymphs Abs: 2.4 10*3/uL (ref 0.7–4.0)
MCH: 27.5 pg (ref 26.0–34.0)
MCHC: 30.7 g/dL (ref 30.0–36.0)
MCV: 89.5 fL (ref 80.0–100.0)
Monocytes Absolute: 0.6 10*3/uL (ref 0.1–1.0)
Monocytes Relative: 8 %
Neutro Abs: 4.1 10*3/uL (ref 1.7–7.7)
Neutrophils Relative %: 54 %
Platelets: 512 10*3/uL — ABNORMAL HIGH (ref 150–400)
RBC: 4.4 MIL/uL (ref 3.87–5.11)
RDW: 16.5 % — ABNORMAL HIGH (ref 11.5–15.5)
WBC: 7.6 10*3/uL (ref 4.0–10.5)
nRBC: 0 % (ref 0.0–0.2)

## 2019-11-03 LAB — BASIC METABOLIC PANEL
Anion gap: 10 (ref 5–15)
BUN: 8 mg/dL (ref 6–20)
CO2: 27 mmol/L (ref 22–32)
Calcium: 8.7 mg/dL — ABNORMAL LOW (ref 8.9–10.3)
Chloride: 100 mmol/L (ref 98–111)
Creatinine, Ser: 0.55 mg/dL (ref 0.44–1.00)
GFR calc Af Amer: >60 mL/min (ref >60)
GFR calc non Af Amer: >60 mL/min (ref >60)
Glucose, Bld: 106 mg/dL — ABNORMAL HIGH (ref 70–99)
Potassium: 3.4 mmol/L — ABNORMAL LOW (ref 3.5–5.1)
Sodium: 137 mmol/L (ref 135–145)

## 2019-11-03 LAB — MAGNESIUM: Magnesium: 2 mg/dL (ref 1.7–2.4)

## 2019-11-03 NOTE — TOC Progression Note (Signed)
Transition of Care [facility]) - Progression Note   Patient Details  Name: LAURIN PAULO MRN: [id number] Date of Birth: 08-16-1980  Transition of Care [facility]) CM/SW [place], LCSW Phone Number: 11/03/2019, 3:07 PM  Clinical Narrative: CSW followed up with prior authorization for Pure [place] Rafter with RN, Estill Bamberg, with urologist's office. Per Estill Bamberg, [place] has not been obtained at this time. TOC to follow.  Expected Discharge Plan: Home/Self Care Barriers to Discharge: Other (comment) (trying to get pure wick)  Expected Discharge Plan and Services Expected Discharge Plan: Home/Self Care Living arrangements for the past 2 months: Single Family Home  Readmission Risk Interventions Readmission Risk Prevention Plan 01/12/2019 01/12/2019 11/12/2018  Transportation Screening (No Data) Complete -  PCP or Specialist Appt within 3-5 Days - Complete Not Complete  HRI or [place] - Complete -  Social Work Consult for [place] Planning/Counseling - Complete -  Palliative Care Screening - Not Applicable Not Applicable  Medication Review (RN Care Manager) - Complete -  Some recent data might be hidden
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
## 2019-11-04 ENCOUNTER — Telehealth: Payer: Self-pay

## 2019-11-04 LAB — BASIC METABOLIC PANEL
Anion gap: 10 (ref 5–15)
BUN: 9 mg/dL (ref 6–20)
CO2: 28 mmol/L (ref 22–32)
Calcium: 9.1 mg/dL (ref 8.9–10.3)
Chloride: 100 mmol/L (ref 98–111)
Creatinine, Ser: 0.61 mg/dL (ref 0.44–1.00)
GFR calc Af Amer: >60 mL/min (ref >60)
GFR calc non Af Amer: >60 mL/min (ref >60)
Glucose, Bld: 92 mg/dL (ref 70–99)
Potassium: 4.3 mmol/L (ref 3.5–5.1)
Sodium: 138 mmol/L (ref 135–145)

## 2019-11-04 LAB — CULTURE, BLOOD (ROUTINE X 2)
Culture: NO GROWTH
Culture: NO GROWTH
Special Requests: ADEQUATE
Special Requests: ADEQUATE

## 2019-11-04 LAB — CBC WITH DIFFERENTIAL/PLATELET
Abs Immature Granulocytes: 0.03 10*3/uL (ref 0.00–0.07)
Basophils Absolute: 0.1 10*3/uL (ref 0.0–0.1)
Basophils Relative: 1 %
Eosinophils Absolute: 0.5 10*3/uL (ref 0.0–0.5)
Eosinophils Relative: 5 %
HCT: 43.4 % (ref 36.0–46.0)
Hemoglobin: 13 g/dL (ref 12.0–15.0)
Immature Granulocytes: 0 %
Lymphocytes Relative: 25 %
Lymphs Abs: 2.5 10*3/uL (ref 0.7–4.0)
MCH: 27.5 pg (ref 26.0–34.0)
MCHC: 30 g/dL (ref 30.0–36.0)
MCV: 91.9 fL (ref 80.0–100.0)
Monocytes Absolute: 0.7 10*3/uL (ref 0.1–1.0)
Monocytes Relative: 7 %
Neutro Abs: 6.4 10*3/uL (ref 1.7–7.7)
Neutrophils Relative %: 62 %
Platelets: 510 10*3/uL — ABNORMAL HIGH (ref 150–400)
RBC: 4.72 MIL/uL (ref 3.87–5.11)
RDW: 16.6 % — ABNORMAL HIGH (ref 11.5–15.5)
WBC: 10.2 10*3/uL (ref 4.0–10.5)
nRBC: 0 % (ref 0.0–0.2)

## 2019-11-04 LAB — MAGNESIUM: Magnesium: 2.1 mg/dL (ref 1.7–2.4)

## 2019-11-04 NOTE — TOC Progression Note (Addendum)
Transition of Care [facility]) - Progression Note   Patient Details  Name: DEIDREA GAETZ MRN: [id number] Date of Birth: 08-May-1980  Transition of Care [facility]) CM/SW [place], LCSW Phone Number: 11/04/2019, 11:55 AM  Clinical Narrative:  Per urology RN, Estill Bamberg, the office is still waiting for prior authorization for Pure [facility] for home use. TOC to follow.  Addendum: RN, Estill Bamberg, has faxed Medicaid request for prior approval for Pure Elza Rafter again to the medical department.  Expected Discharge Plan: Home/Self Care Barriers to Discharge: Other (comment) (trying to get pure wick)  Expected Discharge Plan and Services Expected Discharge Plan: Home/Self Care Living arrangements for the past 2 months: Single Family Home  Readmission Risk Interventions Readmission Risk Prevention Plan 01/12/2019 01/12/2019 11/12/2018  Transportation Screening (No Data) Complete -  PCP or Specialist Appt within 3-5 Days - Complete Not Complete  HRI or [place] - Complete -  Social Work Consult for [place] Planning/Counseling - Complete -  Palliative Care Screening - Not Applicable Not Applicable  Medication Review (RN Care Manager) - Complete -  Some recent data might be hidden
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
## 2019-11-04 NOTE — Telephone Encounter (Signed)
Error

## 2019-11-04 NOTE — Telephone Encounter (Signed)
PA being attempted for Pure Wick catheter through [facility]. Fax PA is pending.   New fax sent again today for request.  Also, spoke with Jinny Blossom, Case worker at the hospital to update along with pt Mom.  Dr. Alyson Ingles aware of pending Pure Elza Rafter process and hospital keeping patient until the Pure Elza Rafter was approved by Case worker. Discussed with Dr. Alyson Ingles who recommends placing a 71F cath 30cc balloon and instilling 80cc of water to hold catheter in place for short term to have patient dc home. Order was given to Dr. Caren Griffins and Nurse Reed Breech on floor- both did not feel comfortable to place at the order given by Dr. Alyson Ingles. Dr. Alyson Ingles called back and informed- per Dr. Alyson Ingles they would need to call on call to come place catheter if necessary.

## 2019-11-05 LAB — MAGNESIUM: Magnesium: 2.1 mg/dL (ref 1.7–2.4)

## 2019-11-05 LAB — CBC WITH DIFFERENTIAL/PLATELET
Abs Immature Granulocytes: 0.02 10*3/uL (ref 0.00–0.07)
Basophils Absolute: 0.1 10*3/uL (ref 0.0–0.1)
Basophils Relative: 1 %
Eosinophils Absolute: 0.5 10*3/uL (ref 0.0–0.5)
Eosinophils Relative: 6 %
HCT: 42.7 % (ref 36.0–46.0)
Hemoglobin: 13 g/dL (ref 12.0–15.0)
Immature Granulocytes: 0 %
Lymphocytes Relative: 34 %
Lymphs Abs: 2.8 10*3/uL (ref 0.7–4.0)
MCH: 27.3 pg (ref 26.0–34.0)
MCHC: 30.4 g/dL (ref 30.0–36.0)
MCV: 89.5 fL (ref 80.0–100.0)
Monocytes Absolute: 0.5 10*3/uL (ref 0.1–1.0)
Monocytes Relative: 7 %
Neutro Abs: 4.3 10*3/uL (ref 1.7–7.7)
Neutrophils Relative %: 52 %
Platelets: 523 10*3/uL — ABNORMAL HIGH (ref 150–400)
RBC: 4.77 MIL/uL (ref 3.87–5.11)
RDW: 16.2 % — ABNORMAL HIGH (ref 11.5–15.5)
WBC: 8.2 10*3/uL (ref 4.0–10.5)
nRBC: 0 % (ref 0.0–0.2)
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
## 2019-11-06 LAB — CBC WITH DIFFERENTIAL/PLATELET
Abs Immature Granulocytes: 0.03 10*3/uL (ref 0.00–0.07)
Basophils Absolute: 0.1 10*3/uL (ref 0.0–0.1)
Basophils Relative: 1 %
Eosinophils Absolute: 0.4 10*3/uL (ref 0.0–0.5)
Eosinophils Relative: 5 %
HCT: 45.7 % (ref 36.0–46.0)
Hemoglobin: 13.9 g/dL (ref 12.0–15.0)
Immature Granulocytes: 0 %
Lymphocytes Relative: 36 %
Lymphs Abs: 2.8 10*3/uL (ref 0.7–4.0)
MCH: 27.4 pg (ref 26.0–34.0)
MCHC: 30.4 g/dL (ref 30.0–36.0)
MCV: 90 fL (ref 80.0–100.0)
Monocytes Absolute: 0.5 10*3/uL (ref 0.1–1.0)
Monocytes Relative: 7 %
Neutro Abs: 4 10*3/uL (ref 1.7–7.7)
Neutrophils Relative %: 51 %
Platelets: 543 10*3/uL — ABNORMAL HIGH (ref 150–400)
RBC: 5.08 MIL/uL (ref 3.87–5.11)
RDW: 16.2 % — ABNORMAL HIGH (ref 11.5–15.5)
WBC: 7.8 10*3/uL (ref 4.0–10.5)
nRBC: 0 % (ref 0.0–0.2)

## 2019-11-06 LAB — CREATININE, SERUM
Creatinine, Ser: 0.52 mg/dL (ref 0.44–1.00)
GFR calc Af Amer: >60 mL/min (ref >60)
GFR calc non Af Amer: >60 mL/min (ref >60)

## 2019-11-06 LAB — MAGNESIUM: Magnesium: 2.1 mg/dL (ref 1.7–2.4)
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
## 2019-11-07 LAB — CBC WITH DIFFERENTIAL/PLATELET
Abs Immature Granulocytes: 0.02 10*3/uL (ref 0.00–0.07)
Basophils Absolute: 0.1 10*3/uL (ref 0.0–0.1)
Basophils Relative: 1 %
Eosinophils Absolute: 0.4 10*3/uL (ref 0.0–0.5)
Eosinophils Relative: 5 %
HCT: 46.3 % — ABNORMAL HIGH (ref 36.0–46.0)
Hemoglobin: 14.2 g/dL (ref 12.0–15.0)
Immature Granulocytes: 0 %
Lymphocytes Relative: 40 %
Lymphs Abs: 3.3 10*3/uL (ref 0.7–4.0)
MCH: 27.6 pg (ref 26.0–34.0)
MCHC: 30.7 g/dL (ref 30.0–36.0)
MCV: 89.9 fL (ref 80.0–100.0)
Monocytes Absolute: 0.5 10*3/uL (ref 0.1–1.0)
Monocytes Relative: 6 %
Neutro Abs: 4 10*3/uL (ref 1.7–7.7)
Neutrophils Relative %: 48 %
Platelets: 541 10*3/uL — ABNORMAL HIGH (ref 150–400)
RBC: 5.15 MIL/uL — ABNORMAL HIGH (ref 3.87–5.11)
RDW: 16.6 % — ABNORMAL HIGH (ref 11.5–15.5)
WBC: 8.3 10*3/uL (ref 4.0–10.5)
nRBC: 0 % (ref 0.0–0.2)

## 2019-11-07 LAB — MAGNESIUM: Magnesium: 2.1 mg/dL (ref 1.7–2.4)
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
## 2019-11-08 ENCOUNTER — Encounter (HOSPITAL_BASED_OUTPATIENT_CLINIC_OR_DEPARTMENT_OTHER): Payer: Medicaid Other | Admitting: Internal Medicine

## 2019-11-08 LAB — CBC WITH DIFFERENTIAL/PLATELET
Abs Immature Granulocytes: 0.03 10*3/uL (ref 0.00–0.07)
Basophils Absolute: 0 10*3/uL (ref 0.0–0.1)
Basophils Relative: 1 %
Eosinophils Absolute: 0.4 10*3/uL (ref 0.0–0.5)
Eosinophils Relative: 4 %
HCT: 45.2 % (ref 36.0–46.0)
Hemoglobin: 13.8 g/dL (ref 12.0–15.0)
Immature Granulocytes: 0 %
Lymphocytes Relative: 40 %
Lymphs Abs: 3.2 10*3/uL (ref 0.7–4.0)
MCH: 28 pg (ref 26.0–34.0)
MCHC: 30.5 g/dL (ref 30.0–36.0)
MCV: 91.9 fL (ref 80.0–100.0)
Monocytes Absolute: 0.6 10*3/uL (ref 0.1–1.0)
Monocytes Relative: 8 %
Neutro Abs: 3.9 10*3/uL (ref 1.7–7.7)
Neutrophils Relative %: 47 %
Platelets: 474 10*3/uL — ABNORMAL HIGH (ref 150–400)
RBC: 4.92 MIL/uL (ref 3.87–5.11)
RDW: 16.4 % — ABNORMAL HIGH (ref 11.5–15.5)
WBC: 8.2 10*3/uL (ref 4.0–10.5)
nRBC: 0 % (ref 0.0–0.2)

## 2019-11-08 LAB — MAGNESIUM: Magnesium: 2.2 mg/dL (ref 1.7–2.4)

## 2019-11-08 MED ORDER — NAPHAZOLINE-PHENIRAMINE 0.025-0.3 % OP SOLN
1.0000 [drp] | Freq: Four times a day (QID) | OPHTHALMIC | Status: DC | PRN
  Administered 2019-11-08 – 2019-11-09 (×3): 1 [drp] via OPHTHALMIC
  Filled 2019-11-08: qty 5
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
## 2019-11-08 NOTE — TOC Progression Note (Signed)
Transition of Care [facility]) - Progression Note   Patient Details  Name: [name] MRN: [id number] Date of Birth: 07/10/1980  Transition of Care [facility]) CM/SW [place], LCSW Phone Number: 11/08/2019, 2:44 PM  Clinical Narrative: Per urology RN, Estill Bamberg, the office is still waiting for prior authorization for Pure [facility] as the patient's Medicaid ID does not match her name and DOB, likely due to Medicaid changing to managed care plans. Patient's mother faxed a copy of patient's Medicaid card, but the ID is the same from her previous Medicaid card. CSW unable to get assistance with any of the Medicaid numbers provided [phone], [phone], [phone], [phone]). CSW updated Estill Bamberg. TOC to follow.  Expected Discharge Plan: Home/Self Care Barriers to Discharge: Other (comment) (trying to get pure wick)  Expected Discharge Plan and Services Expected Discharge Plan: Home/Self Care Living arrangements for the past 2 months: Single Family Home  Readmission Risk Interventions Readmission Risk Prevention Plan 01/12/2019 01/12/2019 11/12/2018  Transportation Screening (No Data) Complete -  PCP or Specialist Appt within 3-5 Days - Complete Not Complete  HRI or [place] - Complete -  Social Work Consult for [place] Planning/Counseling - Complete -  Palliative Care Screening - Not Applicable Not Applicable  Medication Review (RN Care Manager) - Complete -  Some recent data might be hidden

## 2019-11-09 ENCOUNTER — Telehealth: Payer: Self-pay

## 2019-11-09 ENCOUNTER — Encounter (HOSPITAL_COMMUNITY): Payer: Self-pay

## 2019-11-09 ENCOUNTER — Ambulatory Visit (HOSPITAL_COMMUNITY): Admit: 2019-11-09 | Payer: Medicaid Other

## 2019-11-09 DIAGNOSIS — R3981 Functional urinary incontinence: Secondary | ICD-10-CM

## 2019-11-09 LAB — CBC WITH DIFFERENTIAL/PLATELET
Abs Immature Granulocytes: 0.03 10*3/uL (ref 0.00–0.07)
Basophils Absolute: 0.1 10*3/uL (ref 0.0–0.1)
Basophils Relative: 1 %
Eosinophils Absolute: 0.3 10*3/uL (ref 0.0–0.5)
Eosinophils Relative: 4 %
HCT: 43.1 % (ref 36.0–46.0)
Hemoglobin: 13.1 g/dL (ref 12.0–15.0)
Immature Granulocytes: 0 %
Lymphocytes Relative: 30 %
Lymphs Abs: 2.4 10*3/uL (ref 0.7–4.0)
MCH: 27.6 pg (ref 26.0–34.0)
MCHC: 30.4 g/dL (ref 30.0–36.0)
MCV: 90.7 fL (ref 80.0–100.0)
Monocytes Absolute: 0.8 10*3/uL (ref 0.1–1.0)
Monocytes Relative: 9 %
Neutro Abs: 4.5 10*3/uL (ref 1.7–7.7)
Neutrophils Relative %: 56 %
Platelets: 432 10*3/uL — ABNORMAL HIGH (ref 150–400)
RBC: 4.75 MIL/uL (ref 3.87–5.11)
RDW: 16.1 % — ABNORMAL HIGH (ref 11.5–15.5)
WBC: 8.1 10*3/uL (ref 4.0–10.5)
nRBC: 0 % (ref 0.0–0.2)

## 2019-11-09 LAB — BASIC METABOLIC PANEL
Anion gap: 13 (ref 5–15)
BUN: 8 mg/dL (ref 6–20)
CO2: 26 mmol/L (ref 22–32)
Calcium: 9 mg/dL (ref 8.9–10.3)
Chloride: 100 mmol/L (ref 98–111)
Creatinine, Ser: 0.59 mg/dL (ref 0.44–1.00)
GFR calc Af Amer: >60 mL/min (ref >60)
GFR calc non Af Amer: >60 mL/min (ref >60)
Glucose, Bld: 97 mg/dL (ref 70–99)
Potassium: 3.8 mmol/L (ref 3.5–5.1)
Sodium: 139 mmol/L (ref 135–145)

## 2019-11-09 LAB — MAGNESIUM: Magnesium: 2 mg/dL (ref 1.7–2.4)

## 2019-11-09 MED ORDER — CHLORHEXIDINE GLUCONATE CLOTH 2 % EX PADS
6.0000 | MEDICATED_PAD | Freq: Every day | CUTANEOUS | Status: DC
  Administered 2019-11-09 – 2019-11-10 (×2): 6 via TOPICAL

## 2019-11-09 NOTE — TOC Progression Note (Signed)
Transition of Care [facility]) - Progression Note    Patient Details  Name: [name] MRN: [id number] Date of Birth: 04/16/1980  Transition of Care [facility]) CM/SW Contact  Salome Arnt, [place] Phone Number: 11/09/2019, 2:38 PM  Clinical Narrative:  LCSW received call from pt's mother who was very upset that pt was having catheter placed this afternoon. She states she called [employer] and was told that no prior approval had been submitted for pure wick. LCSW discussed with Estill Bamberg at urology office who reports fax confirmation was received multiple times for [employer], but they were never able to get in touch with anyone to follow up. LCSW spoke with [employer] who confirmed no pending prior approvals at this time. Discussed with supervisor and will start PA from hospital for pure wick. Fax sent. Pt's mother updated. TOC to follow.      Expected Discharge Plan: Home/Self Care Barriers to Discharge: Other (comment) (Unable to get assistance from [facility]; awaiting PA from [facility] for [place])  Expected Discharge Plan and Services Expected Discharge Plan: Home/Self Care       Living arrangements for the past 2 months: Single Family Home                                       Social Determinants of Health (SDOH) Interventions    Readmission Risk Interventions Readmission Risk Prevention Plan 01/12/2019 01/12/2019 11/12/2018  Transportation Screening (No Data) Complete -  PCP or Specialist Appt within 3-5 Days - Complete Not Complete  HRI or [place] - Complete -  Social Work Consult for [place] Planning/Counseling - Complete -  Palliative Care Screening - Not Applicable Not Applicable  Medication Review Press photographer) - Complete -  Some recent data might be hidden
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
## 2019-11-09 NOTE — Progress Notes (Addendum)
Dr. Alyson Ingles inserted 24 French foley with 30 ml balloon and filled balloon with 80 ml sterile water.  Clear urine returned. Securement device placed . Stated he wanted her to remain here until tomorrow.

## 2019-11-09 NOTE — Progress Notes (Signed)
Subjective: Patient is awaiting purewick approval from [place]. She continues to have continuous incontinence.  Objective: Vital signs in last 24 hours: Temp:  [98.1 F (36.7 C)-98.3 F (36.8 C)] 98.2 F (36.8 C) (09/08 1410) Pulse Rate:  [79-89] 86 (09/08 1410) Resp:  [14] 14 (09/08 0608) BP: (111-152)/(66-81) 111/81 (09/08 1410) SpO2:  [94 %-99 %] 98 % (09/08 1410)  Intake/Output from previous day: 09/07 0701 - 09/08 0700 In: -  Out: 800 [Urine:800] Intake/Output this shift: Total I/O In: 480 [P.O.:480] Out: -   Physical Exam:  General:alert, cooperative and appears stated age GI: soft, non tender, normal bowel sounds, no palpable masses, no organomegaly, no inguinal hernia Female genitalia: not done Urethra: not indicated and urethral erosion  na  Lab Results: Recent Labs    11/07/19 0601 11/08/19 0351 11/09/19 0616  HGB 14.2 13.8 13.1  HCT 46.3* 45.2 43.1   BMET Recent Labs    11/09/19 0616  NA 139  K 3.8  CL 100  CO2 26  GLUCOSE 97  BUN 8  CREATININE 0.59  CALCIUM 9.0   No results for input(s): LABPT, INR in the last 72 hours. No results for input(s): LABURIN in the last 72 hours. Results for orders placed or performed during the hospital encounter of 10/30/19  SARS Coronavirus 2 by RT PCR (hospital order, performed in [facility] hospital lab) Nasopharyngeal Nasopharyngeal Swab     Status: None   Collection Time: 10/30/19  3:51 AM   Specimen: Nasopharyngeal Swab  Result Value Ref Range Status   SARS Coronavirus 2 NEGATIVE NEGATIVE Final    Comment: (NOTE) SARS-CoV-2 target nucleic acids are NOT DETECTED.  The SARS-CoV-2 RNA is generally detectable in upper and lower respiratory specimens during the acute phase of infection. The lowest concentration of SARS-CoV-2 viral copies this assay can detect is 250 copies / mL. A negative result does not preclude SARS-CoV-2 infection and should not be used as the sole basis for treatment or  other patient management decisions.  A negative result may occur with improper specimen collection / handling, submission of specimen other than nasopharyngeal swab, presence of viral mutation(s) within the areas targeted by this assay, and inadequate number of viral copies (<250 copies / mL). A negative result must be combined with clinical observations, patient history, and epidemiological information.  Fact Sheet for Patients:   StrictlyIdeas.no  Fact Sheet for Healthcare Providers: BankingDealers.co.za  This test is not yet approved or  cleared by the [place] FDA and has been authorized for detection and/or diagnosis of SARS-CoV-2 by FDA under an Emergency Use Authorization (EUA).  This EUA will remain in effect (meaning this test can be used) for the duration of the COVID-19 declaration under Section 564(b)(1) of the Act, 21 U.S.C. section 360bbb-3(b)(1), unless the authorization is terminated or revoked sooner.  Performed at [facility], [address], [place], [place] [address]   Urine culture     Status: Abnormal   Collection Time: 10/30/19  4:05 AM   Specimen: In/Out Cath Urine  Result Value Ref Range Status   Specimen Description   Final    IN/OUT CATH URINE Performed at [facility], [address], [place], [place] [address]    Special Requests   Final    NONE Performed at [facility], [address], [place], [place] [address]    Culture MULTIPLE SPECIES PRESENT, SUGGEST RECOLLECTION (A)  Final   Report Status 10/31/2019 FINAL  Final  Blood culture (routine x 2)  Status: None   Collection Time: 10/30/19  4:07 AM   Specimen: BLOOD  Result Value Ref Range Status   Specimen Description BLOOD RIGHT ANTECUBITAL  Final   Special Requests   Final    BOTTLES DRAWN AEROBIC AND ANAEROBIC Blood Culture adequate volume   Culture   Final    NO GROWTH 5 DAYS Performed at [facility], [address], [place],  [place] [address]    Report Status 11/04/2019 FINAL  Final  Blood culture (routine x 2)     Status: None   Collection Time: 10/30/19  4:30 AM   Specimen: BLOOD  Result Value Ref Range Status   Specimen Description BLOOD RIGHT ANTECUBITAL  Final   Special Requests   Final    BOTTLES DRAWN AEROBIC AND ANAEROBIC Blood Culture adequate volume   Culture   Final    NO GROWTH 5 DAYS Performed at [facility], [address], [place],  [address]    Report Status 11/04/2019 FINAL  Final    Studies/Results: No results found.  Assessment/Plan: 39yo with continuous urinary incontinence 1. 22 french foley placed today and balloon filled with 70cc of water. The patient can be discharged from a urologic standpoint. She has been referred to [facility] for urethral reconstruction/bladder neck closure   LOS: 10 days   Nicolette Bang 11/09/2019, 6:30 PM

## 2019-11-09 NOTE — Telephone Encounter (Signed)
Attempted to reach out to [facility] again today concerning a PA needed for Pure [facility] per hospital case workers needed before patient can be discharged home.   Unable to speak with anyone at [facility]. Number called and all prompts hit and no response daily with each attempts. Multiple faxes sent for DME approval for pure wick with no response.  Dr. Alyson Ingles is aware and new order given to relay to floor nurse- Izora Gala on 300 given for  70F with 30cc balloon  With water at bedside.  Dr. Alyson Ingles will go over mid day to place catheter.

## 2019-11-10 DIAGNOSIS — R3981 Functional urinary incontinence: Secondary | ICD-10-CM

## 2019-11-10 MED ORDER — MISC. DEVICES KIT: PACK | 0 refills | Status: DC

## 2019-11-10 MED ORDER — OXYCODONE HCL 10 MG PO TABS
15.0000 mg | ORAL_TABLET | Freq: Three times a day (TID) | ORAL | 0 refills | Status: AC | PRN
Start: 2019-11-10 — End: 2019-11-14

## 2019-11-10 NOTE — Progress Notes (Signed)
Patient expressed concerns regarding foley catheter leaking a little. Small amount of urine noted to bed pad when changed. Dr. Alyson Ingles not on call today and had not rounded yet. Office stated he was out of office. Patient also Stated her insurance company was also requiring prescription for the purewick system at home. Notified Dr. Sophronia Simas. Stated to contact Dr. Abner Greenspan, on call for urology today regarding foley concern and he printed prescription for purewick system. Provided prescription with review of discharge paperwork. Notified Dr. Abner Greenspan of foley concern, stated okay to discharge since small amount of leakage, follow-up with urology at [facility] as scheduled and call Dr. Noland Fordyce office with any concerns. Patient aware and agreeable with that plan. Given AVS. Verbalized understanding of instructions. IV site removed, site within normal limits. Patient in stable condition awaiting EMS transport for discharge home.

## 2019-11-10 NOTE — Progress Notes (Signed)
Subjective: Patient had minimal incontinence with the new foley in place.  Objective: Vital signs in last 24 hours: Temp:  [97.6 F (36.4 C)] 97.6 F (36.4 C) (09/09 0641) Pulse Rate:  [85-88] 88 (09/09 0641) Resp:  [18] 18 (09/09 0641) BP: (99-102)/(58-69) 102/69 (09/09 0641) SpO2:  [94 %-97 %] 94 % (09/09 0823)  Intake/Output from previous day: 09/08 0701 - 09/09 0700 In: 720 [P.O.:720] Out: 600 [Urine:600] Intake/Output this shift: No intake/output data recorded.  Physical Exam:  General:alert, cooperative and appears stated age GI: soft, non tender, normal bowel sounds, no palpable masses, no organomegaly, no inguinal hernia Female genitalia: not done Urethra: not indicated and urethral erosion  na  Lab Results: Recent Labs    11/08/19 0351 11/09/19 0616  HGB 13.8 13.1  HCT 45.2 43.1   BMET Recent Labs    11/09/19 0616  NA 139  K 3.8  CL 100  CO2 26  GLUCOSE 97  BUN 8  CREATININE 0.59  CALCIUM 9.0   No results for input(s): LABPT, INR in the last 72 hours. No results for input(s): LABURIN in the last 72 hours. Results for orders placed or performed during the hospital encounter of 10/30/19  SARS Coronavirus 2 by RT PCR (hospital order, performed in [facility] hospital lab) Nasopharyngeal Nasopharyngeal Swab     Status: None   Collection Time: 10/30/19  3:51 AM   Specimen: Nasopharyngeal Swab  Result Value Ref Range Status   SARS Coronavirus 2 NEGATIVE NEGATIVE Final    Comment: (NOTE) SARS-CoV-2 target nucleic acids are NOT DETECTED.  The SARS-CoV-2 RNA is generally detectable in upper and lower respiratory specimens during the acute phase of infection. The lowest concentration of SARS-CoV-2 viral copies this assay can detect is 250 copies / mL. A negative result does not preclude SARS-CoV-2 infection and should not be used as the sole basis for treatment or other patient management decisions.  A negative result may occur with improper specimen  collection / handling, submission of specimen other than nasopharyngeal swab, presence of viral mutation(s) within the areas targeted by this assay, and inadequate number of viral copies (<250 copies / mL). A negative result must be combined with clinical observations, patient history, and epidemiological information.  Fact Sheet for Patients:   StrictlyIdeas.no  Fact Sheet for Healthcare Providers: BankingDealers.co.za  This test is not yet approved or  cleared by the [place] FDA and has been authorized for detection and/or diagnosis of SARS-CoV-2 by FDA under an Emergency Use Authorization (EUA).  This EUA will remain in effect (meaning this test can be used) for the duration of the COVID-19 declaration under Section 564(b)(1) of the Act, 21 U.S.C. section 360bbb-3(b)(1), unless the authorization is terminated or revoked sooner.  Performed at [facility], [address], [place], [place] [address]   Urine culture     Status: Abnormal   Collection Time: 10/30/19  4:05 AM   Specimen: In/Out Cath Urine  Result Value Ref Range Status   Specimen Description   Final    IN/OUT CATH URINE Performed at [facility], [address], [place], [place] [address]    Special Requests   Final    NONE Performed at [facility], [address] [place], [place] [address]    Culture MULTIPLE SPECIES PRESENT, SUGGEST RECOLLECTION (A)  Final   Report Status 10/31/2019 FINAL  Final  Blood culture (routine x 2)     Status: None   Collection Time: 10/30/19  4:07 AM  Specimen: BLOOD  Result Value Ref Range Status   Specimen Description BLOOD RIGHT ANTECUBITAL  Final   Special Requests   Final    BOTTLES DRAWN AEROBIC AND ANAEROBIC Blood Culture adequate volume   Culture   Final    NO GROWTH 5 DAYS Performed at [facility], [address], [place], [place] [address]    Report Status 11/04/2019 FINAL  Final  Blood culture (routine x 2)      Status: None   Collection Time: 10/30/19  4:30 AM   Specimen: BLOOD  Result Value Ref Range Status   Specimen Description BLOOD RIGHT ANTECUBITAL  Final   Special Requests   Final    BOTTLES DRAWN AEROBIC AND ANAEROBIC Blood Culture adequate volume   Culture   Final    NO GROWTH 5 DAYS Performed at [facility], [address] [place], [place] [address]    Report Status 11/04/2019 FINAL  Final    Studies/Results: No results found.  Assessment/Plan: 38yo with continuous urinary incontinence 1. The patient can be discharged from a urologic standpoint. She is instructed to call the office with any issues with her foley catheter  LOS: 11 days   Nicolette Bang 11/10/2019, 2:48 PM
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
## 2019-11-10 NOTE — TOC Transition Note (Signed)
Transition of Care [facility]) - CM/SW Discharge Note   Patient Details  Name: [name] MRN: [id number] Date of Birth: 11/22/1980  [facility] CM/SW Contact:  Salome Arnt, LCSW Phone Number: 11/10/2019, 11:25 AM   Clinical Narrative:  Pt d/c home today. LCSW called [employer] about PA for pure wick (ref # [id number]). Per [employer], faxes can take 24-48 hours to be uploaded into system. Pt's mother updated. Pt's mother plans to call and check on status of PA. MD to send prescription for pure wick home with pt. Requesting transport via [facility]. LCSW completed form and RN will call when pt is ready. Pt and mother asking about seeing urologist prior to d/c. RN following up with this.      Final next level of care: Home/Self Care Barriers to Discharge: Barriers Resolved   Patient Goals and CMS Choice Patient states their goals for this hospitalization and ongoing recovery are:: return home at discharge      Discharge Placement                Patient to be transferred to facility by: [facility] Name of family member notified: Arville Go- mother Patient and family notified of of transfer: 11/10/19  Discharge Plan and Services                                     Social Determinants of Health (SDOH) Interventions     Readmission Risk Interventions Readmission Risk Prevention Plan 01/12/2019 01/12/2019 11/12/2018  Transportation Screening (No Data) Complete -  PCP or Specialist Appt within 3-5 Days - Complete Not Complete  HRI or [place] - Complete -  Social Work Consult for [place] Planning/Counseling - Complete -  Palliative Care Screening - Not Applicable Not Applicable  Medication Review Press photographer) - Complete -  Some recent data might be hidden

## 2019-11-10 NOTE — Progress Notes (Signed)
Catheter leaking a small amount. Pad changed two times throughout the night.

## 2019-11-11 ENCOUNTER — Telehealth: Payer: Self-pay | Admitting: Family Medicine

## 2019-11-11 NOTE — Telephone Encounter (Signed)
Mom aware that [facility] can do x ray there since it was in epic.

## 2019-11-17 ENCOUNTER — Ambulatory Visit (HOSPITAL_COMMUNITY)
Admission: RE | Admit: 2019-11-17 | Discharge: 2019-11-17 | Disposition: A | Discharge status: Home / Self Care | Payer: Medicaid Other | Source: Ambulatory Visit | Attending: Family Medicine | Admitting: Family Medicine

## 2019-11-17 ENCOUNTER — Telehealth: Payer: Self-pay | Admitting: Family Medicine

## 2019-11-17 ENCOUNTER — Encounter (HOSPITAL_COMMUNITY): Payer: Self-pay

## 2019-11-17 ENCOUNTER — Ambulatory Visit (HOSPITAL_COMMUNITY)
Admission: RE | Admit: 2019-11-17 | Discharge: 2019-11-17 | Disposition: A | Discharge status: Home / Self Care | Payer: Medicaid Other | Source: Ambulatory Visit | Attending: Student | Admitting: Student

## 2019-11-17 DIAGNOSIS — M5412 Radiculopathy, cervical region: Secondary | ICD-10-CM

## 2019-11-17 DIAGNOSIS — M24576 Contracture, unspecified foot: Secondary | ICD-10-CM | POA: Diagnosis present

## 2019-11-17 NOTE — Telephone Encounter (Signed)
Tim aware that Baclofen pump is managed by pain management.  Tim will contact patient

## 2019-11-18 ENCOUNTER — Telehealth: Payer: Self-pay | Admitting: Family Medicine

## 2019-11-18 ENCOUNTER — Telehealth: Payer: Self-pay

## 2019-11-18 ENCOUNTER — Other Ambulatory Visit: Payer: Self-pay | Admitting: Family Medicine

## 2019-11-18 NOTE — Telephone Encounter (Signed)
Pt mother called this office this am upset regarding pure wick catheter PA through [place]. Per the mother, she reports the hospital social worker, Marcene Brawn, told her they started the PA and it would now "fall into our court" and we had the reference number.   I informed the mother I did not have any reference number for this patient from the hospital and after speaking with [place] on 9/9 I have not heard back from her regarding the PA the social worker had submitted. Mother gave me a reference number of [id number] and I looked through pt chart and found reference number of [id number] per Saudie with [facility] [employer] there is still no pending PA for this patient PA despite the mother being told by the hospital one was in process.   Per Saudie with [place] [employer], she said the manual fax of the PA form department may be behind in their uploading process and to attempt with on line PA through the portal. I do not have access the [facility] portal.   We will attempt  Tracks log on through our [place] scheduler if she has access.

## 2019-11-18 NOTE — Telephone Encounter (Signed)
Reached out to a Catheter supply company called [employer] regarding Pure Wick Catheter for home use.  Per Legrand Como, CMS has not yet approved quantity of the catheter tubing needed for home use and does not cover the vacuum device either.   Called Mother to relay the message- waiting for her to call back.

## 2019-11-22 ENCOUNTER — Encounter (HOSPITAL_BASED_OUTPATIENT_CLINIC_OR_DEPARTMENT_OTHER): Payer: Medicaid Other | Attending: Internal Medicine | Admitting: Internal Medicine

## 2019-11-22 DIAGNOSIS — L89154 Pressure ulcer of sacral region, stage 4: Secondary | ICD-10-CM | POA: Diagnosis present

## 2019-11-22 DIAGNOSIS — G8222 Paraplegia, incomplete: Secondary | ICD-10-CM | POA: Diagnosis not present

## 2019-11-22 NOTE — Progress Notes (Signed)
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 the clinic last time however they managed to find a box of silver alginate so they are using that with backing gauze. They are still changing this twice a day because of drainage issues. She has Medicaid and they will not be able to replace the want dressing she will have to go back to a wet-to-dry. In spite of this the wound actually looks quite good some improvement in dimension 7/13; using silver alginate.  Slight improvement in overall wound volume including depth although there is still undermining. She tells me she is offloading this is much as possible 8/10-Patient back at 1 month, continuing to use silver alginate although she has run out and therefore using saline wet-to-dry, undermining is minimal, continuing attempts at offloading according to patient 9/21; its been about 9 weeks since I have seen this patient although I note she was seen once in August. Her wounds on the lower sacrum this looks a lot better than the last time I saw this. She is using silver alginate to the wound bed. They only have Medicaid therefore they have to need to pay out-of-pocket for the dressing supplies. This certainly limits options. 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 Objective Constitutional Patient is hypertensive.. Pulse regular and within target range for patient.[name] Respirations regular, non-labored and within target  range.. Temperature is normal and within the target range for the patient.[name] Appears in no distress. Vitals Time Taken: 1:25 PM, Height: 65 in, Weight: 201 lbs, BMI: 33.4, Temperature: 98.5 F, Pulse: 99 bpm, Respiratory Rate: 18 breaths/min, Blood Pressure: 146/93 mmHg. General Notes: Wound exam; sacral wound I think the overall wound volume is improved. The granulation tissue on the wound surface looks quite good. There is no evidence of anything that needs debridement. No palpable bone. No surrounding infection. No soft tissue crepitus Integumentary (Hair, Skin) Wound #1 status is Open. Original cause of wound was Pressure Injury. The wound is located on the Medial Sacrum. The wound measures 2.2cm length x 1.8cm width x 1.7cm depth; 3.11cm^2 area and 5.287cm^3 volume. There is Fat Layer (Subcutaneous Tissue) exposed. There is no tunneling noted, however, there is undermining starting at 1:00 and ending at 4:00  with a maximum distance of 3cm. There is a medium amount of serosanguineous drainage noted. The wound margin is well defined and not attached to the wound base. There is medium (34-66%) red granulation within the wound bed. There is a medium (34-66%) amount of necrotic tissue within the wound bed including Adherent Slough. Assessment Active Problems ICD-10 Pressure ulcer of sacral region, stage 4 Paraplegia, incomplete Plan Follow-up Appointments: Return appointment in 1 month. Dressing Change Frequency: Change dressing every day. Wound Cleansing: May shower and wash wound with soap and water. Primary Wound Dressing: Calcium Alginate with Silver - until patient runs out then use 1/4 strenght dakins solution moist gauze to dry daily , instructions given on how to make Secondary Dressing: Dry Gauze ABD pad - secure with tape #1 I think we can continue with silver alginate. They seem to be buying this online and find it affordable. Given her Medicaid status there is not easy affordable options for wound care 2. I see no evidence of infection in this area. In fact the wound looks a lot better than the last time I saw this. 3. Follow-up in 1 month Electronic Signature(s) Signed: 11/22/2019 4:19:57 PM By: Linton Ham MD Entered By: Linton Ham on 11/22/2019 14:39:04 -------------------------------------------------------------------------------- SuperBill Details Patient Name: Date of Service: Jamie Boyden A. 11/22/2019 Medical Record Number: [id number] Patient Account Number: [id number] Date of Birth/Sex: Treating RN: 08-28-1980 (39 y.o. [name] Primary Care Provider: Jori Moll, Karsten Fells Other Clinician: Referring Provider: Treating Provider/Extender: Yevonne Aline, [name] Weeks in Treatment: 177 Diagnosis Coding ICD-10 Codes Code Description L89.154 Pressure ulcer of sacral region, stage 4 G82.22 Paraplegia, incomplete Facility Procedures CPT4 Code:  [id number] Description: 99213 - WOUND CARE VISIT-LEV 3 EST PT Modifier: Quantity: 1 Physician Procedures : CPT4 Code Description Modifier [id number] 70017 - WC PHYS LEVEL 3 - EST PT ICD-10 Diagnosis Description L89.154 Pressure ulcer of sacral region, stage 4 G82.22 Paraplegia, incomplete Quantity: 1 Electronic Signature(s) Signed: 11/22/2019 4:19:57 PM By: Linton Ham MD Entered By: Linton Ham on 11/22/2019 14:39:28

## 2019-11-24 NOTE — Progress Notes (Signed)
[name], [name] ([id number]) Visit Report for 11/22/2019 Arrival Information Details Patient Name: Date of Service: [name], [name] 11/22/2019 1:15 PM Medical Record Number: [id number] Patient Account Number: [id number] Date of Birth/Sex: Treating RN: Jun 25, 1980 (39 y.o. [name] Primary Care Ariv Penrod: Jori Moll, Karsten Fells Other Clinician: Referring Georgio Hattabaugh: Treating Izaiah Tabb/Extender: Yevonne Aline, [name] Weeks in Treatment: 62 Visit Information History Since Last Visit Added or deleted any medications: No Patient Arrived: Wheel Chair Any new allergies or adverse reactions: No Arrival Time: 13:25 Had a fall or experienced change in No Accompanied By: caregiver activities of daily living that may affect Transfer Assistance: None risk of falls: Patient Identification Verified: Yes Signs or symptoms of abuse/neglect since last visito No Secondary Verification Process Completed: Yes Hospitalized since last visit: No Patient Requires Transmission-Based Precautions: No Implantable device outside of the clinic excluding No Patient Has Alerts: No cellular tissue based products placed in the center since last visit: Has Dressing in Place as Prescribed: Yes Pain Present Now: Yes Electronic Signature(s) Signed: 11/24/2019 8:15:52 AM By: Sandre Kitty Entered By: Sandre Kitty on 11/22/2019 13:25:49 -------------------------------------------------------------------------------- Clinic Level of Care Assessment Details Patient Name: Date of Service: [name] 11/22/2019 1:15 PM Medical Record Number: [id number] Patient Account Number: [id number] Date of Birth/Sex: Treating RN: 09-20-1980 (39 y.o. [name] Primary Care Evaleen Sant: Jori Moll, Karsten Fells Other Clinician: Referring Zechariah Bissonnette: Treating Aldon Hengst/Extender: Yevonne Aline, [name] Weeks in Treatment: [facility] Level of Care Assessment Items TOOL 4 Quantity Score X- 1 0 Use when  only an EandM is performed on FOLLOW-UP visit ASSESSMENTS - Nursing Assessment / Reassessment X- 1 10 Reassessment of Co-morbidities (includes updates in patient status) X- 1 5 Reassessment of Adherence to Treatment Plan ASSESSMENTS - Wound and Skin A ssessment / Reassessment X - Simple Wound Assessment / Reassessment - one wound 1 5 '[]'  - 0 Complex Wound Assessment / Reassessment - multiple wounds '[]'  - 0 Dermatologic / Skin Assessment (not related to wound area) ASSESSMENTS - Focused Assessment '[]'  - 0 Circumferential Edema Measurements - multi extremities '[]'  - 0 Nutritional Assessment / Counseling / Intervention '[]'  - 0 Lower Extremity Assessment (monofilament, tuning fork, pulses) '[]'  - 0 Peripheral Arterial Disease Assessment (using hand held doppler) ASSESSMENTS - Ostomy and/or Continence Assessment and Care '[]'  - 0 Incontinence Assessment and Management '[]'  - 0 Ostomy Care Assessment and Management (repouching, etc.) PROCESS - Coordination of Care X - Simple Patient / Family Education for ongoing care 1 15 '[]'  - 0 Complex (extensive) Patient / Family Education for ongoing care X- 1 10 Staff obtains Programmer, systems, Records, T Results / Process Orders est '[]'  - 0 Staff telephones HHA, Nursing Homes / Clarify orders / etc '[]'  - 0 Routine Transfer to another Facility (non-emergent condition) '[]'  - 0 Routine Hospital Admission (non-emergent condition) '[]'  - 0 New Admissions / Biomedical engineer / Ordering NPWT Apligraf, etc. , '[]'  - 0 Emergency Hospital Admission (emergent condition) X- 1 10 Simple Discharge Coordination '[]'  - 0 Complex (extensive) Discharge Coordination PROCESS - Special Needs '[]'  - 0 Pediatric / Minor Patient Management '[]'  - 0 Isolation Patient Management '[]'  - 0 Hearing / Language / Visual special needs '[]'  - 0 Assessment of Community assistance (transportation, D/C planning, etc.) '[]'  - 0 Additional assistance / Altered mentation '[]'  - 0 Support  Surface(s) Assessment (bed, cushion, seat, etc.) INTERVENTIONS - Wound Cleansing / Measurement X - Simple Wound Cleansing - one wound 1 5 '[]'  - 0 Complex Wound Cleansing -  multiple wounds X- 1 5 Wound Imaging (photographs - any number of wounds) '[]'  - 0 Wound Tracing (instead of photographs) X- 1 5 Simple Wound Measurement - one wound '[]'  - 0 Complex Wound Measurement - multiple wounds INTERVENTIONS - Wound Dressings '[]'  - 0 Small Wound Dressing one or multiple wounds X- 1 15 Medium Wound Dressing one or multiple wounds '[]'  - 0 Large Wound Dressing one or multiple wounds X- 1 5 Application of Medications - topical '[]'  - 0 Application of Medications - injection INTERVENTIONS - Miscellaneous '[]'  - 0 External ear exam '[]'  - 0 Specimen Collection (cultures, biopsies, blood, body fluids, etc.) '[]'  - 0 Specimen(s) / Culture(s) sent or taken to Lab for analysis '[]'  - 0 Patient Transfer (multiple staff / Civil Service fast streamer / Similar devices) '[]'  - 0 Simple Staple / Suture removal (25 or less) '[]'  - 0 Complex Staple / Suture removal (26 or more) '[]'  - 0 Hypo / Hyperglycemic Management (close monitor of Blood Glucose) '[]'  - 0 Ankle / Brachial Index (ABI) - do not check if billed separately X- 1 5 Vital Signs Has the patient been seen at the hospital within the last three years: Yes Total Score: 95 Level Of Care: New/Established - Level 3 Electronic Signature(s) Signed: 11/22/2019 4:24:48 PM By: Carlene Coria RN Entered By: Carlene Coria on 11/22/2019 14:15:49 -------------------------------------------------------------------------------- Encounter Discharge Information Details Patient Name: Date of Service: Jamie Boyden A. 11/22/2019 1:15 PM Medical Record Number: [id number] Patient Account Number: [id number] Date of Birth/Sex: Treating RN: 12-24-1980 (39 y.o. Clearnce Sorrel Primary Care Meera Vasco: Jori Moll, Karsten Fells Other Clinician: Referring Ursala Cressy: Treating [place] Capers/Extender:  Curlene Dolphin Weeks in Treatment: [phone] Encounter Discharge Information Items Discharge Condition: Stable Ambulatory Status: Wheelchair Discharge Destination: Home Transportation: Other Accompanied By: caregiver Schedule Follow-up Appointment: Yes Clinical Summary of Care: Patient Declined Electronic Signature(s) Signed: 11/22/2019 4:07:33 PM By: Kela Millin Entered By: Kela Millin on 11/22/2019 14:32:24 -------------------------------------------------------------------------------- Lower Extremity Assessment Details Patient Name: Date of Service: [name], [name] 11/22/2019 1:15 PM Medical Record Number: [id number] Patient Account Number: [id number] Date of Birth/Sex: Treating RN: 1980/12/01 (39 y.o. Clearnce Sorrel Primary Care Rayen Dafoe: Jori Moll, Karsten Fells Other Clinician: Referring Tamaj Jurgens: Treating Karlis Cregg/Extender: Yevonne Aline, [name] Weeks in Treatment: [place] Signature(s) Signed: 11/22/2019 4:07:33 PM By: Kela Millin Entered By: Kela Millin on 11/22/2019 13:37:42 -------------------------------------------------------------------------------- Multi Wound Chart Details Patient Name: Date of Service: [name], [name] 11/22/2019 1:15 PM Medical Record Number: [id number] Patient Account Number: [id number] Date of Birth/Sex: Treating RN: 01/02/1981 (39 y.o. [name] Primary Care Tejasvi Brissett: Jori Moll, Karsten Fells Other Clinician: Referring Havish Petties: Treating Hildreth Orsak/Extender: Yevonne Aline, [name] Weeks in Treatment: 177 Vital Signs Height(in): 65 Pulse(bpm): 99 Weight(lbs): 201 Blood Pressure(mmHg): 146/93 Body Mass Index(BMI): 33 Temperature(F): 98.5 Respiratory Rate(breaths/min): 18 Photos: [1:No Photos Medial Sacrum] [N/A:N/A N/A] Wound Location: [1:Pressure Injury] [N/A:N/A] Wounding Event: [1:Pressure Ulcer] [N/A:N/A] Primary Etiology: [1:Anemia, Osteomyelitis]  [N/A:N/A] Comorbid History: [1:05/15/2016] [N/A:N/A] Date Acquired: [7:510] [N/A:N/A] Weeks of Treatment: [1:Open] [N/A:N/A] Wound Status: [1:2.2x1.8x1.7] [N/A:N/A] Measurements L x W x D (cm) [1:3.11] [N/A:N/A] A (cm) : rea [1:5.287] [N/A:N/A] Volume (cm) : [1:90.50%] [N/A:N/A] % Reduction in A rea: [1:96.30%] [N/A:N/A] % Reduction in Volume: [1:1] Starting Position 1 (o'clock): [1:4] Ending Position 1 (o'clock): [1:3] Maximum Distance 1 (cm): [1:Yes] [N/A:N/A] Undermining: [1:Category/Stage IV] [N/A:N/A] Classification: [1:Medium] [N/A:N/A] Exudate A mount: [1:Serosanguineous] [N/A:N/A] Exudate Type: [1:red, brown] [N/A:N/A] Exudate Color: [1:Well defined, not attached] [N/A:N/A] Wound Margin: [1:Medium (34-66%)] [N/A:N/A] Granulation  A mount: [1:Red] [N/A:N/A] Granulation Quality: [1:Medium (34-66%)] [N/A:N/A] Necrotic A mount: [1:Fat Layer (Subcutaneous Tissue): Yes N/A] Exposed Structures: [1:Fascia: No Tendon: No Muscle: No Joint: No Bone: No Small (1-33%)] [N/A:N/A] Treatment Notes Wound #1 (Medial Sacrum) 1. Cleanse With Wound Cleanser 2. Periwound Care Skin Prep 3. Primary Dressing Applied Calcium Alginate Ag 4. Secondary Dressing ABD Pad Dry Gauze 5. Secured With Recruitment consultant) Signed: 11/22/2019 4:19:57 PM By: Linton Ham MD Signed: 11/22/2019 4:24:48 PM By: Carlene Coria RN Entered By: Linton Ham on 11/22/2019 14:34:31 -------------------------------------------------------------------------------- Multi-Disciplinary Care Plan Details Patient Name: Date of Service: [name], [name] 11/22/2019 1:15 PM Medical Record Number: [id number] Patient Account Number: [id number] Date of Birth/Sex: Treating RN: [birth year] (39 y.o. [name] Primary Care Paddy Walthall: Jori Moll, Karsten Fells Other Clinician: Referring Vasilia Dise: Treating Weltha Cathy/Extender: Yevonne Aline, [name] Weeks in Treatment: 177 Active Inactive Wound/Skin  Impairment Nursing Diagnoses: Impaired tissue integrity Knowledge deficit related to smoking impact on wound healing Knowledge deficit related to ulceration/compromised skin integrity Goals: Patient will demonstrate a reduced rate of smoking or cessation of smoking Date Initiated: 06/27/2016 Date Inactivated: 01/26/2017 Target Resolution Date: 01/08/2017 Goal Status: Unmet Unmet Reason: pt continues to smoke Patient/caregiver will verbalize understanding of skin care regimen Date Initiated: 06/27/2016 Target Resolution Date: 12/20/2019 Goal Status: Active Ulcer/skin breakdown will have a volume reduction of 50% by week 8 Date Initiated: 06/27/2016 Date Inactivated: 12/11/2016 Target Resolution Date: 07/25/2016 Goal Status: Met Interventions: Assess patient/caregiver ability to obtain necessary supplies Assess patient/caregiver ability to perform ulcer/skin care regimen upon admission and as needed Assess ulceration(s) every visit Provide education on smoking Provide education on ulcer and skin care Treatment Activities: Skin care regimen initiated : 06/27/2016 Topical wound management initiated : 06/27/2016 Notes: Electronic Signature(s) Signed: 11/22/2019 4:24:48 PM By: Carlene Coria RN Entered By: Carlene Coria on 11/22/2019 13:46:10 -------------------------------------------------------------------------------- Pain Assessment Details Patient Name: Date of Service: [name], [name] 11/22/2019 1:15 PM Medical Record Number: [id number] Patient Account Number: [id number] Date of Birth/Sex: Treating RN: 1980/04/11 (39 y.o. [name] Primary Care Prachi Oftedahl: Jori Moll, Karsten Fells Other Clinician: Referring Sweta Halseth: Treating Kensey Luepke/Extender: Yevonne Aline, [name] Weeks in Treatment: 177 Active Problems Location of Pain Severity and Description of Pain Patient Has Paino Yes Patient Has Paino Yes Site Locations Rate the pain. Current Pain Level: 7 Pain  Management and Medication Current Pain Management: Electronic Signature(s) Signed: 11/22/2019 4:24:48 PM By: Carlene Coria RN Signed: 11/24/2019 8:15:52 AM By: Sandre Kitty Entered By: Sandre Kitty on 11/22/2019 13:26:14 -------------------------------------------------------------------------------- Patient/Caregiver Education Details Patient Name: Date of Service: [name] 9/21/2021andnbsp1:15 PM Medical Record Number: [id number] Patient Account Number: [id number] Date of Birth/Gender: Treating RN: 1980-07-03 (39 y.o. [name] Primary Care Physician: Jori Moll, Karsten Fells Other Clinician: Referring Physician: Treating Physician/Extender: Vella Kohler in Treatment: 177 Education Assessment Education Provided To: Patient Education Topics Provided Smoking and Wound Healing: Methods: Explain/Verbal Responses: State content correctly Electronic Signature(s) Signed: 11/22/2019 4:24:48 PM By: Carlene Coria RN Entered By: Carlene Coria on 11/22/2019 13:46:28 -------------------------------------------------------------------------------- Wound Assessment Details Patient Name: Date of Service: [name], [name] 11/22/2019 1:15 PM Medical Record Number: [id number] Patient Account Number: [id number] Date of Birth/Sex: Treating RN: December 06, 1980 (39 y.o. [name] Primary Care Ione [place]: Other Clinician: Jori Moll, Karsten Fells Referring Nolan Tuazon: Treating Syra Sirmons/Extender: Yevonne Aline, [name] Weeks in Treatment: 177 Wound Status Wound Number: 1 Primary Etiology: Pressure Ulcer Wound Location: Medial Sacrum Wound Status: Open Wounding Event: Pressure Injury  Comorbid History: Anemia, Osteomyelitis Date Acquired: 05/15/2016 Weeks Of Treatment: 177 Clustered Wound: No Photos Photo Uploaded By: Mikeal Hawthorne on 11/23/2019 11:26:53 Wound Measurements Length: (cm) 2.2 % R Width: (cm) 1.8 % R Depth: (cm) 1.7 Epi Area: (cm)  3.11 Tu Volume: (cm) 5.287 Un eduction in Area: 90.5% eduction in Volume: 96.3% thelialization: Small (1-33%) nneling: No dermining: Yes Starting Position (o'clock): 1 Ending Position (o'clock): 4 Maximum Distance: (cm) 3 Wound Description Classification: Category/Stage IV Fou Wound Margin: Well defined, not attached Slo Exudate Amount: Medium Exudate Type: Serosanguineous Exudate Color: red, brown l Odor After Cleansing: No ugh/Fibrino Yes Wound Bed Granulation Amount: Medium (34-66%) Exposed Structure Granulation Quality: Red Fascia Exposed: No Necrotic Amount: Medium (34-66%) Fat Layer (Subcutaneous Tissue) Exposed: Yes Necrotic Quality: Adherent Slough Tendon Exposed: No Muscle Exposed: No Joint Exposed: No Bone Exposed: No Treatment Notes Wound #1 (Medial Sacrum) 1. Cleanse With Wound Cleanser 2. Periwound Care Skin Prep 3. Primary Dressing Applied Calcium Alginate Ag 4. Secondary Dressing ABD Pad Dry Gauze 5. Secured With Recruitment consultant) Signed: 11/22/2019 4:07:33 PM By: Kela Millin Signed: 11/22/2019 4:24:48 PM By: Carlene Coria RN Entered By: Kela Millin on 11/22/2019 13:39:13 -------------------------------------------------------------------------------- Vitals Details Patient Name: Date of Service: Jamie Boyden A. 11/22/2019 1:15 PM Medical Record Number: [id number] Patient Account Number: [id number] Date of Birth/Sex: Treating RN: September 25, 1980 (39 y.o. [name] Primary Care Therese Rocco: Jori Moll, Karsten Fells Other Clinician: Referring Velisa Regnier: Treating Aniella Wandrey/Extender: Yevonne Aline, [name] Weeks in Treatment: 177 Vital Signs Time Taken: 13:25 Temperature (F): 98.5 Height (in): 65 Pulse (bpm): 99 Weight (lbs): 201 Respiratory Rate (breaths/min): 18 Body Mass Index (BMI): 33.4 Blood Pressure (mmHg): 146/93 Reference Range: 80 - 120 mg / dl Electronic Signature(s) Signed: 11/24/2019 8:15:52 AM By:  Sandre Kitty Entered By: Sandre Kitty on 11/22/2019 13:26:06

## 2019-11-25 ENCOUNTER — Other Ambulatory Visit: Payer: Self-pay

## 2019-11-25 ENCOUNTER — Encounter: Payer: Self-pay | Admitting: Nurse Practitioner

## 2019-11-25 ENCOUNTER — Ambulatory Visit (INDEPENDENT_AMBULATORY_CARE_PROVIDER_SITE_OTHER): Payer: Medicaid Other

## 2019-11-25 ENCOUNTER — Ambulatory Visit: Payer: Medicaid Other | Admitting: Nurse Practitioner

## 2019-11-25 VITALS — BP 125/90 | HR 96 | Temp 98.1°F | Resp 20

## 2019-11-25 DIAGNOSIS — Z09 Encounter for follow-up examination after completed treatment for conditions other than malignant neoplasm: Secondary | ICD-10-CM | POA: Insufficient documentation

## 2019-11-25 DIAGNOSIS — A419 Sepsis, unspecified organism: Secondary | ICD-10-CM | POA: Diagnosis not present

## 2019-11-25 DIAGNOSIS — Z79899 Other long term (current) drug therapy: Secondary | ICD-10-CM | POA: Diagnosis not present

## 2019-11-25 DIAGNOSIS — J189 Pneumonia, unspecified organism: Secondary | ICD-10-CM | POA: Diagnosis not present

## 2019-11-25 MED ORDER — OXYCODONE HCL 10 MG PO TABS
10.0000 mg | ORAL_TABLET | Freq: Three times a day (TID) | ORAL | 0 refills | Status: AC | PRN
Start: 2019-11-25 — End: 2019-11-30
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
## 2019-11-25 NOTE — Patient Instructions (Signed)
Sepsis, Diagnosis, Adult Sepsis is a serious bodily reaction to an infection. The infection that triggers sepsis may be from a bacteria, virus, or fungus. Sepsis can result from an infection in any part of your body. Infections that commonly lead to sepsis include skin, lung, and urinary tract infections. Sepsis is a medical emergency that must be treated right away in a hospital. In severe cases, it can lead to septic shock. Septic shock can weaken your heart and cause your blood pressure to drop. This can cause your central nervous system and your body's organs to stop working. What are the causes? This condition is caused by a severe reaction to infections from bacteria, viruses, or fungus. The germs that most often lead to sepsis include:  Escherichia coli (E. coli) bacteria.  Staphylococcus aureus (staph) bacteria.  Some types of Streptococcus bacteria. The most common infections affect these organs:  The lung (pneumonia).  The kidneys or bladder (urinary tract infection).  The skin (cellulitis).  The bowel, gallbladder, or pancreas. What increases the risk? You are more likely to develop this condition if:  Your body's disease-fighting system (immune system) is weakened.  You are age 65 or older.  You are female.  You had surgery or you have been hospitalized.  You have these devices inserted into your body: ? A small, thin tube (catheter). ? IV line. ? Breathing tube. ? Drainage tube.  You are not getting enough nutrients from food (malnourished).  You have a long-term (chronic) disease, such as cancer, lung disease, kidney disease, or diabetes.  You are African American. What are the signs or symptoms? Symptoms of this condition may include:  Fever.  Chills or feeling very cold.  Confusion or anxiety.  Fatigue.  Muscle aches.  Shortness of breath.  Nausea and vomiting.  Urinating much less than usual.  Fast heart rate (tachycardia).  Rapid  breathing (hyperventilation).  Changes in skin color. Your skin may look blotchy, pale, or blue.  Cool, clammy, or sweaty skin.  Skin rash. Other symptoms depend on the source of your infection. How is this diagnosed? This condition is diagnosed based on:  Your symptoms.  Your medical history.  A physical exam. Other tests may also be done to find out the cause of the infection and how severe the sepsis is. These tests may include:  Blood tests.  Urine tests.  Swabs from other areas of your body that may have an infection. These samples may be tested (cultured) to find out what type of bacteria is causing the infection.  Chest X-ray to check for pneumonia. Other imaging tests, such as a CT scan, may also be done.  Lumbar puncture. This removes a small amount of the fluid that surrounds your brain and spinal cord. The fluid is then examined for infection. How is this treated? This condition must be treated in a hospital. Based on the cause of your infection, you may be given an antibiotic, antiviral, or antifungal medicine. You may also receive:  Fluids through an IV.  Oxygen and breathing assistance.  Medicines to increase your blood pressure.  Kidney dialysis. This process cleans your blood if your kidneys have failed.  Surgery to remove infected tissue.  Blood transfusion if needed.  Medicine to prevent blood clots.  Nutrients to correct imbalances in basic body function (metabolism). You may: ? Receive important salts and minerals (electrolytes) through an IV. ? Have your blood sugar level adjusted. Follow these instructions at home: Medicines   Take over-the-counter and   prescription medicines only as told by your health care provider.  If you were prescribed an antibiotic, antiviral, or antifungal medicine, take it as told by your health care provider. Do not stop taking the medicine even if you start to feel better. General instructions  If you have a  catheter or other indwelling device, ask to have it removed as soon as possible.  Keep all follow-up visits as told by your health care provider. This is important. Contact a health care provider if:  You do not feel like you are getting better or regaining strength.  You are having trouble coping with your recovery.  You frequently feel tired.  You feel worse or do not seem to get better after surgery.  You think you may have an infection after surgery. Get help right away if:  You have any symptoms of sepsis.  You have difficulty breathing.  You have a rapid or skipping heartbeat.  You become confused or disoriented.  You have a high fever.  Your skin becomes blotchy, pale, or blue.  You have an infection that is getting worse or not getting better. These symptoms may represent a serious problem that is an emergency. Do not wait to see if the symptoms will go away. Get medical help right away. Call your local emergency services (911 in the [place] Do not drive yourself to the hospital. Summary  Sepsis is a medical emergency that requires immediate treatment in a hospital.  This condition is caused by a severe reaction to infections from bacteria, viruses, or fungus.  Based on the cause of your infection, you may be given an antibiotic, antiviral, or antifungal medicine.  Treatment may also include IV fluids, breathing assistance, and kidney dialysis. This information is not intended to replace advice given to you by your health care provider. Make sure you discuss any questions you have with your health care provider. Document Revised: 09/25/2017 Document Reviewed: 09/25/2017 Elsevier Patient Education  2020 [employer]  
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
## 2019-11-25 NOTE — Assessment & Plan Note (Signed)
Patient follow-up hospital discharge.  Patient is stable with no new concerns today.  Patient reports uncontrolled chronic pain in her legs, vaginal area and pressure wound.  Patient is scheduled for urology follow-up.  Hospital discharge instructions completed.  Mom reports that wound is healing appropriately and closing up with no signs or symptoms of infection.  Patient is rating her pain an 8 out of 10 on the pain scale of 0-10. Ordered oxycodone 10 mg as needed for 5 days.  Completed UDS [urine drug screen].  Patient is scheduled with primary care provider Friday, October 1 for care continuation and management  Provided education to patient with printed handouts given. Completed chest x-ray results pending. Follow-up with unresolved or worsening symptoms.

## 2019-12-01 LAB — TOXASSURE SELECT 13 (MW), URINE

## 2019-12-02 ENCOUNTER — Encounter: Payer: Self-pay | Admitting: Family Medicine

## 2019-12-02 ENCOUNTER — Ambulatory Visit (INDEPENDENT_AMBULATORY_CARE_PROVIDER_SITE_OTHER): Payer: Medicaid Other | Admitting: Family Medicine

## 2019-12-02 ENCOUNTER — Other Ambulatory Visit: Payer: Self-pay

## 2019-12-02 VITALS — BP 127/81 | HR 84 | Temp 98.3°F

## 2019-12-02 DIAGNOSIS — K219 Gastro-esophageal reflux disease without esophagitis: Secondary | ICD-10-CM

## 2019-12-02 DIAGNOSIS — Z23 Encounter for immunization: Secondary | ICD-10-CM | POA: Diagnosis not present

## 2019-12-02 DIAGNOSIS — F411 Generalized anxiety disorder: Secondary | ICD-10-CM

## 2019-12-02 DIAGNOSIS — F339 Major depressive disorder, recurrent, unspecified: Secondary | ICD-10-CM

## 2019-12-02 DIAGNOSIS — F410 Panic disorder [episodic paroxysmal anxiety] without agoraphobia: Secondary | ICD-10-CM

## 2019-12-02 DIAGNOSIS — M62838 Other muscle spasm: Secondary | ICD-10-CM

## 2019-12-02 DIAGNOSIS — Z720 Tobacco use: Secondary | ICD-10-CM

## 2019-12-02 DIAGNOSIS — E038 Other specified hypothyroidism: Secondary | ICD-10-CM

## 2019-12-02 DIAGNOSIS — B373 Candidiasis of vulva and vagina: Secondary | ICD-10-CM

## 2019-12-02 DIAGNOSIS — E782 Mixed hyperlipidemia: Secondary | ICD-10-CM

## 2019-12-02 DIAGNOSIS — R399 Unspecified symptoms and signs involving the genitourinary system: Secondary | ICD-10-CM

## 2019-12-02 DIAGNOSIS — G894 Chronic pain syndrome: Secondary | ICD-10-CM

## 2019-12-02 DIAGNOSIS — B3731 Acute candidiasis of vulva and vagina: Secondary | ICD-10-CM

## 2019-12-02 DIAGNOSIS — L8945 Pressure ulcer of contiguous site of back, buttock and hip, unstageable: Secondary | ICD-10-CM

## 2019-12-02 DIAGNOSIS — Z1159 Encounter for screening for other viral diseases: Secondary | ICD-10-CM

## 2019-12-02 MED ORDER — OXYBUTYNIN CHLORIDE ER 5 MG PO TB24: 5.0000 mg | ORAL_TABLET | Freq: Every day | ORAL | 1 refills | Status: DC

## 2019-12-02 MED ORDER — NYSTATIN 100000 UNIT/GM EX POWD: 1.0000 "application " | Freq: Three times a day (TID) | CUTANEOUS | 2 refills | Status: DC

## 2019-12-02 MED ORDER — GABAPENTIN 600 MG PO TABS: 600.0000 mg | ORAL_TABLET | Freq: Four times a day (QID) | ORAL | 1 refills | Status: DC

## 2019-12-02 MED ORDER — OXYCODONE HCL 10 MG PO TABS: 10.0000 mg | ORAL_TABLET | Freq: Three times a day (TID) | ORAL | 0 refills | Status: DC | PRN

## 2019-12-02 MED ORDER — BUPROPION HCL ER (SR) 150 MG PO TB12: 150.0000 mg | ORAL_TABLET | Freq: Two times a day (BID) | ORAL | 1 refills | Status: DC

## 2019-12-02 MED ORDER — FLUCONAZOLE 150 MG PO TABS: 150.0000 mg | ORAL_TABLET | Freq: Once | ORAL | 0 refills | Status: AC

## 2019-12-02 MED ORDER — LEVOTHYROXINE SODIUM 88 MCG PO TABS: 88.0000 ug | ORAL_TABLET | Freq: Every day | ORAL | 1 refills | Status: DC

## 2019-12-02 MED ORDER — CITALOPRAM HYDROBROMIDE 40 MG PO TABS: 40.0000 mg | ORAL_TABLET | Freq: Every day | ORAL | 1 refills | Status: DC

## 2019-12-02 MED ORDER — TIZANIDINE HCL 4 MG PO TABS: 4.0000 mg | ORAL_TABLET | Freq: Four times a day (QID) | ORAL | 5 refills | Status: DC | PRN

## 2019-12-02 MED ORDER — ALPRAZOLAM 0.25 MG PO TABS: 0.2500 mg | ORAL_TABLET | ORAL | 0 refills | Status: DC | PRN

## 2019-12-02 MED ORDER — OMEPRAZOLE 20 MG PO CPDR: 20.0000 mg | DELAYED_RELEASE_CAPSULE | Freq: Every day | ORAL | 1 refills | Status: DC

## 2019-12-02 MED ORDER — PROMETHAZINE HCL 25 MG PO TABS: 25.0000 mg | ORAL_TABLET | Freq: Four times a day (QID) | ORAL | 1 refills | Status: DC | PRN

## 2019-12-02 MED ORDER — ALPRAZOLAM 0.25 MG PO TABS: 0.2500 mg | ORAL_TABLET | Freq: Every day | ORAL | 0 refills | Status: DC | PRN

## 2019-12-02 MED ORDER — BUSPIRONE HCL 15 MG PO TABS: 15.0000 mg | ORAL_TABLET | Freq: Two times a day (BID) | ORAL | 1 refills | Status: DC
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
## 2019-12-02 NOTE — Patient Instructions (Signed)
Decrease Xanax to once daily x4 weeks, then decrease to every other day x4 weeks, then stop.

## 2019-12-03 LAB — LIPID PANEL
Chol/HDL Ratio: 6.4 ratio — ABNORMAL HIGH (ref 0.0–4.4)
Cholesterol, Total: 210 mg/dL — ABNORMAL HIGH (ref 100–199)
HDL: 33 mg/dL — ABNORMAL LOW (ref >39)
LDL Chol Calc (NIH): 154 mg/dL — ABNORMAL HIGH (ref 0–99)
Triglycerides: 128 mg/dL (ref 0–149)
VLDL Cholesterol Cal: 23 mg/dL (ref 5–40)

## 2019-12-03 LAB — HEPATITIS C ANTIBODY: Hep C Virus Ab: 0.2 s/co ratio (ref 0.0–0.9)

## 2019-12-04 ENCOUNTER — Encounter: Payer: Self-pay | Admitting: Family Medicine

## 2019-12-04 DIAGNOSIS — E785 Hyperlipidemia, unspecified: Secondary | ICD-10-CM | POA: Insufficient documentation

## 2019-12-06 DIAGNOSIS — D509 Iron deficiency anemia, unspecified: Secondary | ICD-10-CM | POA: Insufficient documentation

## 2019-12-06 DIAGNOSIS — N83201 Unspecified ovarian cyst, right side: Secondary | ICD-10-CM | POA: Insufficient documentation

## 2019-12-06 DIAGNOSIS — M62838 Other muscle spasm: Secondary | ICD-10-CM | POA: Insufficient documentation

## 2019-12-07 ENCOUNTER — Telehealth: Payer: Self-pay

## 2019-12-07 NOTE — Telephone Encounter (Signed)
When we spoke I told her I could not do more than 3 a day.  I am happy to refer her to pain management.  I feel there has to be somebody else in Dr. Maryjean Ka office that is managing pain since he retired.

## 2019-12-07 NOTE — Telephone Encounter (Signed)
Patient aware and verbalizes understanding.  States she will just stay at 3 a day and not go to pain management.

## 2019-12-20 ENCOUNTER — Other Ambulatory Visit: Payer: Self-pay

## 2019-12-20 ENCOUNTER — Encounter (HOSPITAL_BASED_OUTPATIENT_CLINIC_OR_DEPARTMENT_OTHER): Payer: Medicaid Other | Attending: Internal Medicine | Admitting: Internal Medicine

## 2019-12-20 DIAGNOSIS — L89154 Pressure ulcer of sacral region, stage 4: Secondary | ICD-10-CM | POA: Diagnosis present

## 2019-12-20 DIAGNOSIS — G8222 Paraplegia, incomplete: Secondary | ICD-10-CM | POA: Diagnosis not present

## 2019-12-20 DIAGNOSIS — I10 Essential (primary) hypertension: Secondary | ICD-10-CM | POA: Insufficient documentation

## 2019-12-20 NOTE — Progress Notes (Signed)
[name], [name] ([id number]) Visit Report for 12/20/2019 Arrival Information Details Patient Name: Date of Service: [name], [name] 12/20/2019 1:15 PM Medical Record Number: [id number] Patient Account Number: [id number] Date of Birth/Sex: Treating RN: 1980-03-27 (39 y.o. Nancy Fetter Primary Care Ryota Treece: [name] Other Clinician: Referring [name]: Treating Kolbi Altadonna/Extender: Mare Ferrari in Treatment: 46 Visit Information History Since Last Visit Added or deleted any medications: No Patient Arrived: Wheel Chair Any new allergies or adverse reactions: No Arrival Time: 13:22 Had a fall or experienced change in No Accompanied By: caregiver activities of daily living that may affect Transfer Assistance: Harrel Lemon Lift risk of falls: Patient Identification Verified: Yes Signs or symptoms of abuse/neglect since last visito No Secondary Verification Process Completed: Yes Hospitalized since last visit: No Patient Requires Transmission-Based Precautions: No Implantable device outside of the clinic excluding No Patient Has Alerts: No cellular tissue based products placed in the center since last visit: Has Dressing in Place as Prescribed: Yes Pain Present Now: No Electronic Signature(s) Signed: 12/20/2019 5:04:42 PM By: Levan Hurst RN, BSN Entered By: Levan Hurst on 12/20/2019 13:22:39 -------------------------------------------------------------------------------- Clinic Level of Care Assessment Details Patient Name: Date of Service: [name], Jamie A. 12/20/2019 1:15 PM Medical Record Number: [id number] Patient Account Number: [id number] Date of Birth/Sex: Treating RN: 06/11/1980 (39 y.o. Helene Shoe, Tammi Klippel Primary Care Shawneen Deetz: [name] Other Clinician: Referring Trace Cederberg: Treating Kaely Hollan/Extender: Mare Ferrari in Treatment: 181 Clinic Level of Care Assessment Items TOOL 4 Quantity Score X- 1  0 Use when only an EandM is performed on FOLLOW-UP visit ASSESSMENTS - Nursing Assessment / Reassessment X- 1 10 Reassessment of Co-morbidities (includes updates in patient status) X- 1 5 Reassessment of Adherence to Treatment Plan ASSESSMENTS - Wound and Skin A ssessment / Reassessment X - Simple Wound Assessment / Reassessment - one wound 1 5 '[]'  - 0 Complex Wound Assessment / Reassessment - multiple wounds X- 1 10 Dermatologic / Skin Assessment (not related to wound area) ASSESSMENTS - Focused Assessment '[]'  - 0 Circumferential Edema Measurements - multi extremities X- 1 10 Nutritional Assessment / Counseling / Intervention '[]'  - 0 Lower Extremity Assessment (monofilament, tuning fork, pulses) '[]'  - 0 Peripheral Arterial Disease Assessment (using hand held doppler) ASSESSMENTS - Ostomy and/or Continence Assessment and Care '[]'  - 0 Incontinence Assessment and Management '[]'  - 0 Ostomy Care Assessment and Management (repouching, etc.) PROCESS - Coordination of Care X - Simple Patient / Family Education for ongoing care 1 15 '[]'  - 0 Complex (extensive) Patient / Family Education for ongoing care X- 1 10 Staff obtains Programmer, systems, Records, T Results / Process Orders est '[]'  - 0 Staff telephones HHA, Nursing Homes / Clarify orders / etc '[]'  - 0 Routine Transfer to another Facility (non-emergent condition) '[]'  - 0 Routine Hospital Admission (non-emergent condition) '[]'  - 0 New Admissions / Biomedical engineer / Ordering NPWT Apligraf, etc. , '[]'  - 0 Emergency Hospital Admission (emergent condition) X- 1 10 Simple Discharge Coordination '[]'  - 0 Complex (extensive) Discharge Coordination PROCESS - Special Needs '[]'  - 0 Pediatric / Minor Patient Management '[]'  - 0 Isolation Patient Management '[]'  - 0 Hearing / Language / Visual special needs '[]'  - 0 Assessment of Community assistance (transportation, D/C planning, etc.) '[]'  - 0 Additional assistance / Altered mentation '[]'  -  0 Support Surface(s) Assessment (bed, cushion, seat, etc.) INTERVENTIONS - Wound Cleansing / Measurement X - Simple Wound Cleansing - one wound 1 5 '[]'  - 0 Complex Wound Cleansing -  multiple wounds X- 1 5 Wound Imaging (photographs - any number of wounds) '[]'  - 0 Wound Tracing (instead of photographs) X- 1 5 Simple Wound Measurement - one wound '[]'  - 0 Complex Wound Measurement - multiple wounds INTERVENTIONS - Wound Dressings X - Small Wound Dressing one or multiple wounds 1 10 '[]'  - 0 Medium Wound Dressing one or multiple wounds '[]'  - 0 Large Wound Dressing one or multiple wounds '[]'  - 0 Application of Medications - topical '[]'  - 0 Application of Medications - injection INTERVENTIONS - Miscellaneous '[]'  - 0 External ear exam '[]'  - 0 Specimen Collection (cultures, biopsies, blood, body fluids, etc.) '[]'  - 0 Specimen(s) / Culture(s) sent or taken to Lab for analysis '[]'  - 0 Patient Transfer (multiple staff / Harrel Lemon Lift / Similar devices) '[]'  - 0 Simple Staple / Suture removal (25 or less) '[]'  - 0 Complex Staple / Suture removal (26 or more) '[]'  - 0 Hypo / Hyperglycemic Management (close monitor of Blood Glucose) '[]'  - 0 Ankle / Brachial Index (ABI) - do not check if billed separately X- 1 5 Vital Signs Has the patient been seen at the hospital within the last three years: Yes Total Score: 105 Level Of Care: New/Established - Level 3 Electronic Signature(s) Signed: 12/20/2019 5:04:07 PM By: Deon Pilling Entered By: Deon Pilling on 12/20/2019 16:45:14 -------------------------------------------------------------------------------- Encounter Discharge Information Details Patient Name: Date of Service: Jamie Boyden A. 12/20/2019 1:15 PM Medical Record Number: [id number] Patient Account Number: [id number] Date of Birth/Sex: Treating RN: 03-Aug-1980 (39 y.o. Nancy Fetter Primary Care Khalise Billard: [name] Other Clinician: Referring Revin Corker: Treating  Dickey Caamano/Extender: Mare Ferrari in Treatment: 181 Encounter Discharge Information Items Discharge Condition: Stable Ambulatory Status: Wheelchair Discharge Destination: Home Transportation: Private Auto Accompanied By: caregiver Schedule Follow-up Appointment: Yes Clinical Summary of Care: Patient Declined Electronic Signature(s) Signed: 12/20/2019 5:04:42 PM By: Levan Hurst RN, BSN Entered By: Levan Hurst on 12/20/2019 14:32:03 -------------------------------------------------------------------------------- Multi Wound Chart Details Patient Name: Date of Service: Jamie Boyden A. 12/20/2019 1:15 PM Medical Record Number: [id number] Patient Account Number: [id number] Date of Birth/Sex: Treating RN: 13-Apr-1980 (39 y.o. Helene Shoe, Tammi Klippel Primary Care Khushbu Pippen: [name] Other Clinician: Referring Yvonna Brun: Treating Corvin Sorbo/Extender: Mare Ferrari in Treatment: 181 Vital Signs Height(in): 65 Pulse(bpm): 85 Weight(lbs): 201 Blood Pressure(mmHg): 143/95 Body Mass Index(BMI): 33 Temperature(F): 98.5 Respiratory Rate(breaths/min): 16 Photos: [1:No Photos Medial Sacrum] [N/A:N/A N/A] Wound Location: [1:Pressure Injury] [N/A:N/A] Wounding Event: [1:Pressure Ulcer] [N/A:N/A] Primary Etiology: [1:Anemia, Osteomyelitis] [N/A:N/A] Comorbid History: [1:05/15/2016] [N/A:N/A] Date Acquired: [1:181] [N/A:N/A] Weeks of Treatment: [1:Open] [N/A:N/A] Wound Status: [1:2x1.5x2.4] [N/A:N/A] Measurements L x W x D (cm) [1:2.356] [N/A:N/A] A (cm) : rea [1:5.655] [N/A:N/A] Volume (cm) : [1:92.80%] [N/A:N/A] % Reduction in A rea: [1:96.10%] [N/A:N/A] % Reduction in Volume: [1:2] Starting Position 1 (o'clock): [1:4] Ending Position 1 (o'clock): [1:3] Maximum Distance 1 (cm): [1:Yes] [N/A:N/A] Undermining: [1:Category/Stage IV] [N/A:N/A] Classification: [1:Medium] [N/A:N/A] Exudate A mount: [1:Serosanguineous] [N/A:N/A] Exudate  Type: [1:red, brown] [N/A:N/A] Exudate Color: [1:Well defined, not attached] [N/A:N/A] Wound Margin: [1:Medium (34-66%)] [N/A:N/A] Granulation A mount: [1:Pink] [N/A:N/A] Granulation Quality: [1:Medium (34-66%)] [N/A:N/A] Necrotic A mount: [1:Fat Layer (Subcutaneous Tissue): Yes N/A] Exposed Structures: [1:Fascia: No Tendon: No Muscle: No Joint: No Bone: No Small (1-33%)] [N/A:N/A] Treatment Notes Electronic Signature(s) Signed: 12/20/2019 4:22:08 PM By: Linton Ham MD Signed: 12/20/2019 5:04:07 PM By: Deon Pilling Entered By: Linton Ham on 12/20/2019 13:57:06 -------------------------------------------------------------------------------- Multi-Disciplinary Care Plan Details Patient Name: Date of Service: Jamie Boyden A. 12/20/2019  1:15 PM Medical Record Number: [id number] Patient Account Number: [id number] Date of Birth/Sex: Treating RN: [birth year] (39 y.o. Helene Shoe, Tammi Klippel Primary Care Jorgeluis Gurganus: [name] Other Clinician: Referring Chelcy Bolda: Treating Rolondo Pierre/Extender: Mare Ferrari in Treatment: 181 Active Inactive Wound/Skin Impairment Nursing Diagnoses: Impaired tissue integrity Knowledge deficit related to smoking impact on wound healing Knowledge deficit related to ulceration/compromised skin integrity Goals: Patient will demonstrate a reduced rate of smoking or cessation of smoking Date Initiated: 06/27/2016 Date Inactivated: 01/26/2017 Target Resolution Date: 01/08/2017 Goal Status: Unmet Unmet Reason: pt continues to smoke Patient/caregiver will verbalize understanding of skin care regimen Date Initiated: 06/27/2016 Target Resolution Date: 01/26/2020 Goal Status: Active Ulcer/skin breakdown will have a volume reduction of 50% by week 8 Date Initiated: 06/27/2016 Date Inactivated: 12/11/2016 Target Resolution Date: 07/25/2016 Goal Status: Met Interventions: Assess patient/caregiver ability to obtain necessary  supplies Assess patient/caregiver ability to perform ulcer/skin care regimen upon admission and as needed Assess ulceration(s) every visit Provide education on smoking Provide education on ulcer and skin care Treatment Activities: Skin care regimen initiated : 06/27/2016 Topical wound management initiated : 06/27/2016 Notes: Electronic Signature(s) Signed: 12/20/2019 5:04:07 PM By: Deon Pilling Entered By: Deon Pilling on 12/20/2019 13:52:32 -------------------------------------------------------------------------------- Pain Assessment Details Patient Name: Date of Service: TEKA, CHANDA 12/20/2019 1:15 PM Medical Record Number: [id number] Patient Account Number: [id number] Date of Birth/Sex: Treating RN: 1980/04/25 (39 y.o. Nancy Fetter Primary Care Tangala Wiegert: [name] Other Clinician: Referring Raiden Yearwood: Treating Leslie Jester/Extender: Mare Ferrari in Treatment: 181 Active Problems Location of Pain Severity and Description of Pain Patient Has Paino No Site Locations Pain Management and Medication Current Pain Management: Electronic Signature(s) Signed: 12/20/2019 5:04:42 PM By: Levan Hurst RN, BSN Entered By: Levan Hurst on 12/20/2019 13:23:01 -------------------------------------------------------------------------------- Patient/Caregiver Education Details Patient Name: Date of Service: Karlton Lemon 10/19/2021andnbsp1:15 PM Medical Record Number: [id number] Patient Account Number: [id number] Date of Birth/Gender: Treating RN: 02-11-1981 (39 y.o. [name] Primary Care Physician: [name] Other Clinician: Referring Physician: Treating Physician/Extender: Mare Ferrari in Treatment: [phone] Education Assessment Education Provided To: Patient Education Topics Provided Wound/Skin Impairment: Handouts: Skin Care Do's and Dont's Methods: Explain/Verbal Responses: Reinforcements  needed Electronic Signature(s) Signed: 12/20/2019 5:04:07 PM By: Deon Pilling Entered By: Deon Pilling on 12/20/2019 13:52:43 -------------------------------------------------------------------------------- Wound Assessment Details Patient Name: Date of Service: JAMILYNN, WHITACRE 12/20/2019 1:15 PM Medical Record Number: [id number] Patient Account Number: [id number] Date of Birth/Sex: Treating RN: Dec 21, 1980 (39 y.o. Nancy Fetter Primary Care Darnelle Derrick: [name] Other Clinician: Referring Jonella Redditt: Treating Euphemia Lingerfelt/Extender: Mare Ferrari in Treatment: 181 Wound Status Wound Number: 1 Primary Etiology: Pressure Ulcer Wound Location: Medial Sacrum Wound Status: Open Wounding Event: Pressure Injury Comorbid History: Anemia, Osteomyelitis Date Acquired: 05/15/2016 Weeks Of Treatment: 181 Clustered Wound: No Wound Measurements Length: (cm) 2 Width: (cm) 1.5 Depth: (cm) 2.4 Area: (cm) 2.356 Volume: (cm) 5.655 % Reduction in Area: 92.8% % Reduction in Volume: 96.1% Epithelialization: Small (1-33%) Tunneling: No Undermining: Yes Starting Position (o'clock): 2 Ending Position (o'clock): 4 Maximum Distance: (cm) 3 Wound Description Classification: Category/Stage IV Wound Margin: Well defined, not attached Exudate Amount: Medium Exudate Type: Serosanguineous Exudate Color: red, brown Foul Odor After Cleansing: No Slough/Fibrino Yes Wound Bed Granulation Amount: Medium (34-66%) Exposed Structure Granulation Quality: Pink Fascia Exposed: No Necrotic Amount: Medium (34-66%) Fat Layer (Subcutaneous Tissue) Exposed: Yes Necrotic Quality: Adherent Slough Tendon Exposed: No Muscle Exposed: No Joint Exposed: No Bone Exposed: No Treatment Notes Wound #  1 (Medial Sacrum) 1. Cleanse With Wound Cleanser 3. Primary Dressing Applied Other primary dressing (specifiy in notes) 4. Secondary Dressing ABD Pad 5. Secured  With Tape Notes anasept moistened gauze Electronic Signature(s) Signed: 12/20/2019 5:04:42 PM By: Levan Hurst RN, BSN Entered By: Levan Hurst on 12/20/2019 13:30:58 -------------------------------------------------------------------------------- [place] Details Patient Name: Date of Service: Jamie Boyden A. 12/20/2019 1:15 PM Medical Record Number: [id number] Patient Account Number: [id number] Date of Birth/Sex: Treating RN: 08-15-1980 (39 y.o. Nancy Fetter Primary Care Lavada Langsam: [name] Other Clinician: Referring Valdez Brannan: Treating Emila Steinhauser/Extender: Mare Ferrari in Treatment: 181 Vital Signs Time Taken: 13:22 Temperature (F): 98.5 Height (in): 65 Pulse (bpm): 85 Weight (lbs): 201 Respiratory Rate (breaths/min): 16 Body Mass Index (BMI): 33.4 Blood Pressure (mmHg): 143/95 Reference Range: 80 - 120 mg / dl Electronic Signature(s) Signed: 12/20/2019 5:04:42 PM By: Levan Hurst RN, BSN Entered By: Levan Hurst on 12/20/2019 13:22:55

## 2019-12-22 ENCOUNTER — Telehealth: Payer: Self-pay

## 2019-12-22 NOTE — Progress Notes (Signed)
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 cannot prove there is an open area here per 6/22; they are using a wet to dry dressing when we left the clinic last time however  they managed to find a box of silver alginate so they are using that with backing gauze. They are still changing this twice a day because of drainage issues. She has Medicaid and they will not be able to replace the want dressing she will have to go back to a wet-to-dry. In spite of this the wound actually looks quite good some improvement in dimension 7/13; using silver alginate. Slight improvement in overall wound volume including depth although there is still undermining. She tells me she is offloading this is much as possible 8/10-Patient back at 1 month, continuing to use silver alginate although she has run out and therefore using saline wet-to-dry, undermining is minimal, continuing attempts at offloading according to patient 9/21; its been about 9 weeks since I have seen this patient although I note she was seen once in August. Her wounds on the lower sacrum this looks a lot better than the last time I saw this. She is using silver alginate to the wound bed. They only have Medicaid therefore they have to need to pay out-of-pocket for the dressing supplies. This certainly limits options. 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 10/19; 1 month follow-up. Not much change in the wound in fact it is measuring slightly larger. Still using silver alginate which her mother is purchasing off [employer] I believe. Since she was here she had a suprapubic catheter placed because of a lacerated urethra. Objective Constitutional Patient is hypertensive.. Pulse regular and within target range for patient.[name] Respirations regular, non-labored and within target range.. Temperature is normal and within the target range for the patient.[name] Appears in no distress. Vitals Time Taken: 1:22 PM, Height: 65 in, Weight: 201 lbs, BMI: 33.4, Temperature: 98.5 F, Pulse: 85 bpm, Respiratory Rate: 16 breaths/min, Blood Pressure: 143/95  mmHg. General Notes: Wound exam; sacral wound I think the wound is about the same as a month ago. Tissue looks healthy although it is right against bone there is no surrounding infection no erythema no crepitus there is no purulent drainage. The wound bed looks dry. Integumentary (Hair, Skin) Wound #1 status is Open. Original cause of wound was Pressure Injury. The wound is located on the Medial Sacrum. The wound measures 2cm length x 1.5cm width x 2.4cm depth; 2.356cm^2 area and 5.655cm^3 volume. There is Fat Layer (Subcutaneous Tissue) exposed. There is no tunneling noted, however, there is undermining starting at 2:00 and ending at 4:00 with a maximum distance of 3cm. There is a medium amount of serosanguineous drainage noted. The wound margin is well defined and not attached to the wound base. There is medium (34-66%) pink granulation within the wound bed. There is a medium (34-66%) amount of necrotic tissue within the wound bed including Adherent Slough. Assessment Active Problems ICD-10 Pressure ulcer of sacral region, stage 4 Paraplegia, incomplete Plan Follow-up Appointments: Return appointment in 1 month. Dressing Change Frequency: Change dressing every day. Wound Cleansing: May shower and wash wound with soap and water. Primary Wound Dressing: Wound #1 Medial Sacrum: Other: - wet to dry dressings. use anasept gel moisten gauzes. Secondary Dressing: Wound #1 Medial Sacrum: Dry Gauze ABD pad - secure with tape Off-Loading: Turn and reposition every 2 hours 1. There is been no improvement in this wound with the silver alginate. I have changed her to Anasept gel wet to dry. This has good antibacterial effect and will perhaps add to the moisture. They are having to pay for silver alginate out-of-pocket the Anasept gel should be cheaper to buy over-the-counter. 2. She says she has straight Medicaid which I did not think existed anymore. Electronic Signature(s) Signed: 12/20/2019  4:22:08 PM By: Linton Ham MD Entered By: Linton Ham on 12/20/2019 14:00:57 -------------------------------------------------------------------------------- SuperBill Details Patient Name: Date of Service: Jamie Boyden A. 12/20/2019 Medical Record Number: [id number] Patient Account Number: [id number] Date of Birth/Sex: Treating RN: February 02, 1981 (39 y.o. [name] Primary Care Provider: [name] Other Clinician: Referring Provider: Treating Provider/Extender: Mare Ferrari in Treatment: 181 Diagnosis Coding ICD-10 Codes Code Description L89.154 Pressure ulcer of sacral region, stage 4 G82.22 Paraplegia, incomplete Facility Procedures CPT4 Code: [id number] Description: 99213 - WOUND CARE VISIT-LEV 3 EST PT Modifier: Quantity: 1 Physician Procedures : CPT4 Code Description Modifier [id number] 92330 - WC PHYS LEVEL 3 - EST PT ICD-10 Diagnosis Description L89.154 Pressure ulcer of sacral region, stage 4 G82.22 Paraplegia, incomplete Quantity: 1 Electronic Signature(s) Signed: 12/20/2019 5:04:07 PM By: Deon Pilling Signed: 12/22/2019 5:31:58 PM By: Linton Ham MD Previous Signature: 12/20/2019 4:22:08 PM Version By: Linton Ham MD Entered By: Deon Pilling on 12/20/2019  16:45:27 

## 2019-12-22 NOTE — Telephone Encounter (Signed)
Mother called saying that pt needs a letter stating that purewick is a necessity and that needs to be ordered and not be purchased OTC it has to be ordered at $390 a month. Fax to [employer] [phone]

## 2019-12-23 ENCOUNTER — Telehealth: Payer: Self-pay

## 2019-12-23 NOTE — Telephone Encounter (Signed)
Covering PCP- please advise  

## 2019-12-23 NOTE — Telephone Encounter (Signed)
Ok for letter

## 2019-12-23 NOTE — Telephone Encounter (Signed)
Mom called back to check status of if letter was sent as requested yesterday.  Says the letter also needs to include that pt is Paraplegic, has a sack wound that has to stay dry, has a damaged Urethra, and doesn't know when she has to use the bathroom. Needs to state that the Purewick is not something you can buy OTC; has to be ordered.  Please fax note to Abram Sander @ [phone] ASAP.

## 2019-12-23 NOTE — Telephone Encounter (Signed)
Awaiting to hear back from provider.  Will contact patient as soon as we do.

## 2019-12-23 NOTE — Telephone Encounter (Signed)
Pt mom checking on status of this letter, needs this today before 12

## 2019-12-24 ENCOUNTER — Other Ambulatory Visit: Payer: Self-pay | Admitting: Family Medicine

## 2019-12-26 NOTE — Telephone Encounter (Signed)
Pts landlord called stating that she is still waiting to receive letter that pt has been calling about.   Please call landlord or patient when letter is ready/sent. Needs ASAP

## 2019-12-26 NOTE — Telephone Encounter (Signed)
Letter written and faxed to [place] at [phone]. Left detailed message on voicemail that letter sent

## 2019-12-27 ENCOUNTER — Emergency Department (HOSPITAL_COMMUNITY)
Admission: EM | Admit: 2019-12-27 | Discharge: 2019-12-27 | Disposition: A | Discharge status: Home / Self Care | Payer: Medicaid Other | Attending: Emergency Medicine | Admitting: Emergency Medicine

## 2019-12-27 ENCOUNTER — Encounter (HOSPITAL_COMMUNITY): Payer: Self-pay | Admitting: Emergency Medicine

## 2019-12-27 ENCOUNTER — Emergency Department (HOSPITAL_COMMUNITY): Payer: Medicaid Other

## 2019-12-27 ENCOUNTER — Other Ambulatory Visit: Payer: Self-pay

## 2019-12-27 DIAGNOSIS — Z960 Presence of urogenital implants: Secondary | ICD-10-CM | POA: Diagnosis not present

## 2019-12-27 DIAGNOSIS — Z87891 Personal history of nicotine dependence: Secondary | ICD-10-CM | POA: Insufficient documentation

## 2019-12-27 DIAGNOSIS — Z79899 Other long term (current) drug therapy: Secondary | ICD-10-CM | POA: Diagnosis not present

## 2019-12-27 DIAGNOSIS — N390 Urinary tract infection, site not specified: Secondary | ICD-10-CM | POA: Insufficient documentation

## 2019-12-27 DIAGNOSIS — K5900 Constipation, unspecified: Secondary | ICD-10-CM | POA: Insufficient documentation

## 2019-12-27 DIAGNOSIS — E039 Hypothyroidism, unspecified: Secondary | ICD-10-CM | POA: Insufficient documentation

## 2019-12-27 DIAGNOSIS — R1032 Left lower quadrant pain: Secondary | ICD-10-CM | POA: Diagnosis present

## 2019-12-27 LAB — CBC WITH DIFFERENTIAL/PLATELET
Abs Immature Granulocytes: 0.04 10*3/uL (ref 0.00–0.07)
Basophils Absolute: 0 10*3/uL (ref 0.0–0.1)
Basophils Relative: 1 %
Eosinophils Absolute: 0.3 10*3/uL (ref 0.0–0.5)
Eosinophils Relative: 3 %
HCT: 44.8 % (ref 36.0–46.0)
Hemoglobin: 13.7 g/dL (ref 12.0–15.0)
Immature Granulocytes: 1 %
Lymphocytes Relative: 24 %
Lymphs Abs: 2.1 10*3/uL (ref 0.7–4.0)
MCH: 28.5 pg (ref 26.0–34.0)
MCHC: 30.6 g/dL (ref 30.0–36.0)
MCV: 93.3 fL (ref 80.0–100.0)
Monocytes Absolute: 0.6 10*3/uL (ref 0.1–1.0)
Monocytes Relative: 7 %
Neutro Abs: 5.7 10*3/uL (ref 1.7–7.7)
Neutrophils Relative %: 64 %
Platelets: 454 10*3/uL — ABNORMAL HIGH (ref 150–400)
RBC: 4.8 MIL/uL (ref 3.87–5.11)
RDW: 15.8 % — ABNORMAL HIGH (ref 11.5–15.5)
WBC: 8.7 10*3/uL (ref 4.0–10.5)
nRBC: 0 % (ref 0.0–0.2)

## 2019-12-27 LAB — WET PREP, GENITAL
Clue Cells Wet Prep HPF POC: NONE SEEN
Sperm: NONE SEEN
Trich, Wet Prep: NONE SEEN
WBC, Wet Prep HPF POC: NONE SEEN
Yeast Wet Prep HPF POC: NONE SEEN

## 2019-12-27 LAB — COMPREHENSIVE METABOLIC PANEL
ALT: 11 U/L (ref 0–44)
AST: 10 U/L — ABNORMAL LOW (ref 15–41)
Albumin: 3.9 g/dL (ref 3.5–5.0)
Alkaline Phosphatase: 66 U/L (ref 38–126)
Anion gap: 10 (ref 5–15)
BUN: 13 mg/dL (ref 6–20)
CO2: 28 mmol/L (ref 22–32)
Calcium: 9 mg/dL (ref 8.9–10.3)
Chloride: 101 mmol/L (ref 98–111)
Creatinine, Ser: 0.52 mg/dL (ref 0.44–1.00)
GFR, Estimated: >60 mL/min (ref >60)
Glucose, Bld: 90 mg/dL (ref 70–99)
Potassium: 3.5 mmol/L (ref 3.5–5.1)
Sodium: 139 mmol/L (ref 135–145)
Total Bilirubin: 0.6 mg/dL (ref 0.3–1.2)
Total Protein: 8 g/dL (ref 6.5–8.1)

## 2019-12-27 LAB — URINALYSIS, ROUTINE W REFLEX MICROSCOPIC
Bilirubin Urine: NEGATIVE
Glucose, UA: NEGATIVE mg/dL
Ketones, ur: NEGATIVE mg/dL
Nitrite: NEGATIVE
Protein, ur: NEGATIVE mg/dL
Specific Gravity, Urine: 1.005 (ref 1.005–1.030)
pH: 7 (ref 5.0–8.0)

## 2019-12-27 LAB — LIPASE, BLOOD: Lipase: 26 U/L (ref 11–51)

## 2019-12-27 LAB — PREGNANCY, URINE: Preg Test, Ur: NEGATIVE

## 2019-12-27 MED ORDER — OXYCODONE HCL 5 MG PO TABS
10.0000 mg | ORAL_TABLET | Freq: Once | ORAL | Status: AC
  Administered 2019-12-27: 10 mg via ORAL
  Filled 2019-12-27: qty 2

## 2019-12-27 MED ORDER — PROMETHAZINE HCL 25 MG/ML IJ SOLN
12.5000 mg | Freq: Once | INTRAMUSCULAR | Status: AC
  Administered 2019-12-27: 12.5 mg via INTRAVENOUS
  Filled 2019-12-27: qty 1

## 2019-12-27 MED ORDER — FOSFOMYCIN TROMETHAMINE 3 G PO PACK: 3.0000 g | PACK | Freq: Once | ORAL | 0 refills | Status: AC

## 2019-12-27 MED ORDER — ONDANSETRON HCL 4 MG/2ML IJ SOLN
4.0000 mg | Freq: Once | INTRAMUSCULAR | Status: AC
  Administered 2019-12-27: 4 mg via INTRAVENOUS
  Filled 2019-12-27: qty 2

## 2019-12-27 MED ORDER — HYDROMORPHONE HCL 1 MG/ML IJ SOLN
1.0000 mg | Freq: Once | INTRAMUSCULAR | Status: AC
  Administered 2019-12-27: 1 mg via INTRAVENOUS
  Filled 2019-12-27: qty 1

## 2019-12-27 MED ORDER — FOSFOMYCIN TROMETHAMINE 3 G PO PACK
3.0000 g | PACK | Freq: Once | ORAL | Status: AC
  Administered 2019-12-27: 3 g via ORAL
  Filled 2019-12-27: qty 3

## 2019-12-27 MED ORDER — IOHEXOL 300 MG/ML  SOLN
100.0000 mL | Freq: Once | INTRAMUSCULAR | Status: AC | PRN
  Administered 2019-12-27: 100 mL via INTRAVENOUS

## 2019-12-27 MED ORDER — DOCUSATE SODIUM 100 MG PO CAPS: 100.0000 mg | ORAL_CAPSULE | Freq: Two times a day (BID) | ORAL | 0 refills | Status: DC | PRN

## 2019-12-27 MED ORDER — MORPHINE SULFATE (PF) 4 MG/ML IV SOLN
4.0000 mg | Freq: Once | INTRAVENOUS | Status: AC
  Administered 2019-12-27: 4 mg via INTRAVENOUS
  Filled 2019-12-27: qty 1

## 2019-12-27 MED ORDER — GLYCERIN (ADULT) 2 G RE SUPP: 1.0000 | RECTAL | 0 refills | Status: DC | PRN

## 2019-12-27 MED ORDER — SODIUM CHLORIDE 0.9 % IV BOLUS
500.0000 mL | Freq: Once | INTRAVENOUS | Status: AC
  Administered 2019-12-27: 500 mL via INTRAVENOUS

## 2019-12-27 NOTE — Discharge Instructions (Signed)
Continue taking home medications as prescribed. Your CT showed significant constipation.  This will need to be treated with multiple different methods.  Make sure you are staying well-hydrated water.  Eat a high-fiber diet.  Use glycerin suppositories and Colace to help with stool softener.  Take MiraLAX (1-2 capfuls in 8 ox liquid) to help with encouraging a bowel movement. Your urine was consistent with infection.  You were given the first dose of antibiotics today in the ED.  Repeat again in 3 days. Follow-up with your urologist regarding your urethral pain.  Return to the emergency room if you develop high fevers, persistent vomiting, severe worsening abdominal pain, any new, worsening, or concerning symptoms.

## 2019-12-27 NOTE — ED Triage Notes (Signed)
Per [employer], pt reports abdominal pain, n/v. 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

## 2019-12-27 NOTE — ED Notes (Signed)
Called EMS sceduler for transport back home.
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
## 2019-12-28 LAB — GC/CHLAMYDIA PROBE AMP (~~LOC~~) NOT AT ARMC
Chlamydia: NEGATIVE
Comment: NEGATIVE
Comment: NORMAL
Neisseria Gonorrhea: NEGATIVE

## 2019-12-29 LAB — URINE CULTURE

## 2020-01-02 ENCOUNTER — Telehealth: Payer: Self-pay | Admitting: *Deleted

## 2020-01-02 NOTE — Telephone Encounter (Signed)
PA in process with [facility]. Confirmation #:[id number] W

## 2020-01-03 NOTE — Telephone Encounter (Addendum)
[place] APPROVED for oxycodone 10mg   01/02/2020 - 06/30/2020  Pharmacy aware.

## 2020-01-09 ENCOUNTER — Other Ambulatory Visit: Payer: Self-pay | Admitting: Family Medicine

## 2020-01-09 ENCOUNTER — Emergency Department (HOSPITAL_COMMUNITY): Payer: Medicaid Other

## 2020-01-09 ENCOUNTER — Inpatient Hospital Stay (HOSPITAL_COMMUNITY)
Admission: EM | Admit: 2020-01-09 | Discharge: 2020-01-13 | DRG: 871 | Disposition: A | Discharge status: Home / Self Care | Payer: Medicaid Other | Attending: Internal Medicine | Admitting: Internal Medicine

## 2020-01-09 ENCOUNTER — Other Ambulatory Visit: Payer: Self-pay

## 2020-01-09 ENCOUNTER — Encounter (HOSPITAL_COMMUNITY): Payer: Self-pay | Admitting: Internal Medicine

## 2020-01-09 DIAGNOSIS — K219 Gastro-esophageal reflux disease without esophagitis: Secondary | ICD-10-CM | POA: Diagnosis present

## 2020-01-09 DIAGNOSIS — Z20822 Contact with and (suspected) exposure to covid-19: Secondary | ICD-10-CM | POA: Diagnosis present

## 2020-01-09 DIAGNOSIS — J189 Pneumonia, unspecified organism: Secondary | ICD-10-CM | POA: Diagnosis not present

## 2020-01-09 DIAGNOSIS — F419 Anxiety disorder, unspecified: Secondary | ICD-10-CM | POA: Diagnosis present

## 2020-01-09 DIAGNOSIS — Z981 Arthrodesis status: Secondary | ICD-10-CM | POA: Diagnosis not present

## 2020-01-09 DIAGNOSIS — J9621 Acute and chronic respiratory failure with hypoxia: Secondary | ICD-10-CM | POA: Diagnosis present

## 2020-01-09 DIAGNOSIS — R0902 Hypoxemia: Secondary | ICD-10-CM

## 2020-01-09 DIAGNOSIS — G894 Chronic pain syndrome: Secondary | ICD-10-CM | POA: Diagnosis present

## 2020-01-09 DIAGNOSIS — Z79890 Hormone replacement therapy (postmenopausal): Secondary | ICD-10-CM

## 2020-01-09 DIAGNOSIS — L89154 Pressure ulcer of sacral region, stage 4: Secondary | ICD-10-CM | POA: Diagnosis present

## 2020-01-09 DIAGNOSIS — A419 Sepsis, unspecified organism: Secondary | ICD-10-CM | POA: Diagnosis present

## 2020-01-09 DIAGNOSIS — J9601 Acute respiratory failure with hypoxia: Secondary | ICD-10-CM | POA: Diagnosis not present

## 2020-01-09 DIAGNOSIS — Z6841 Body Mass Index (BMI) 40.0 and over, adult: Secondary | ICD-10-CM

## 2020-01-09 DIAGNOSIS — E039 Hypothyroidism, unspecified: Secondary | ICD-10-CM | POA: Diagnosis present

## 2020-01-09 DIAGNOSIS — Z9049 Acquired absence of other specified parts of digestive tract: Secondary | ICD-10-CM | POA: Diagnosis not present

## 2020-01-09 DIAGNOSIS — E876 Hypokalemia: Secondary | ICD-10-CM | POA: Diagnosis present

## 2020-01-09 DIAGNOSIS — Z87891 Personal history of nicotine dependence: Secondary | ICD-10-CM

## 2020-01-09 DIAGNOSIS — N319 Neuromuscular dysfunction of bladder, unspecified: Secondary | ICD-10-CM | POA: Diagnosis present

## 2020-01-09 DIAGNOSIS — E43 Unspecified severe protein-calorie malnutrition: Secondary | ICD-10-CM | POA: Diagnosis present

## 2020-01-09 DIAGNOSIS — J181 Lobar pneumonia, unspecified organism: Secondary | ICD-10-CM | POA: Diagnosis present

## 2020-01-09 DIAGNOSIS — E782 Mixed hyperlipidemia: Secondary | ICD-10-CM | POA: Diagnosis present

## 2020-01-09 DIAGNOSIS — Z993 Dependence on wheelchair: Secondary | ICD-10-CM

## 2020-01-09 DIAGNOSIS — Z79899 Other long term (current) drug therapy: Secondary | ICD-10-CM | POA: Diagnosis not present

## 2020-01-09 DIAGNOSIS — G8220 Paraplegia, unspecified: Secondary | ICD-10-CM | POA: Diagnosis present

## 2020-01-09 DIAGNOSIS — F32A Depression, unspecified: Secondary | ICD-10-CM | POA: Diagnosis present

## 2020-01-09 DIAGNOSIS — M4628 Osteomyelitis of vertebra, sacral and sacrococcygeal region: Secondary | ICD-10-CM | POA: Diagnosis present

## 2020-01-09 LAB — CBC WITH DIFFERENTIAL/PLATELET
Abs Immature Granulocytes: 0.29 10*3/uL — ABNORMAL HIGH (ref 0.00–0.07)
Basophils Absolute: 0 10*3/uL (ref 0.0–0.1)
Basophils Relative: 0 %
Eosinophils Absolute: 0 10*3/uL (ref 0.0–0.5)
Eosinophils Relative: 0 %
HCT: 35.7 % — ABNORMAL LOW (ref 36.0–46.0)
Hemoglobin: 11.3 g/dL — ABNORMAL LOW (ref 12.0–15.0)
Immature Granulocytes: 1 %
Lymphocytes Relative: 4 %
Lymphs Abs: 1.1 10*3/uL (ref 0.7–4.0)
MCH: 28.9 pg (ref 26.0–34.0)
MCHC: 31.7 g/dL (ref 30.0–36.0)
MCV: 91.3 fL (ref 80.0–100.0)
Monocytes Absolute: 1.1 10*3/uL — ABNORMAL HIGH (ref 0.1–1.0)
Monocytes Relative: 4 %
Neutro Abs: 25.5 10*3/uL — ABNORMAL HIGH (ref 1.7–7.7)
Neutrophils Relative %: 91 %
Platelets: 414 10*3/uL — ABNORMAL HIGH (ref 150–400)
RBC: 3.91 MIL/uL (ref 3.87–5.11)
RDW: 15.9 % — ABNORMAL HIGH (ref 11.5–15.5)
WBC: 28.1 10*3/uL — ABNORMAL HIGH (ref 4.0–10.5)
nRBC: 0 % (ref 0.0–0.2)

## 2020-01-09 LAB — PROTIME-INR
INR: 1.2 (ref 0.8–1.2)
Prothrombin Time: 14.5 seconds (ref 11.4–15.2)

## 2020-01-09 LAB — MRSA PCR SCREENING: MRSA by PCR: NEGATIVE

## 2020-01-09 LAB — LACTIC ACID, PLASMA: Lactic Acid, Venous: 1.5 mmol/L (ref 0.5–1.9)

## 2020-01-09 LAB — COMPREHENSIVE METABOLIC PANEL
ALT: 23 U/L (ref 0–44)
AST: 15 U/L (ref 15–41)
Albumin: 3.1 g/dL — ABNORMAL LOW (ref 3.5–5.0)
Alkaline Phosphatase: 95 U/L (ref 38–126)
Anion gap: 11 (ref 5–15)
BUN: 13 mg/dL (ref 6–20)
CO2: 27 mmol/L (ref 22–32)
Calcium: 8.4 mg/dL — ABNORMAL LOW (ref 8.9–10.3)
Chloride: 99 mmol/L (ref 98–111)
Creatinine, Ser: 0.51 mg/dL (ref 0.44–1.00)
GFR, Estimated: >60 mL/min (ref >60)
Glucose, Bld: 148 mg/dL — ABNORMAL HIGH (ref 70–99)
Potassium: 2.8 mmol/L — ABNORMAL LOW (ref 3.5–5.1)
Sodium: 137 mmol/L (ref 135–145)
Total Bilirubin: 0.7 mg/dL (ref 0.3–1.2)
Total Protein: 7 g/dL (ref 6.5–8.1)

## 2020-01-09 LAB — RESPIRATORY PANEL BY RT PCR (FLU A&B, COVID)
Influenza A by PCR: NEGATIVE
Influenza B by PCR: NEGATIVE
SARS Coronavirus 2 by RT PCR: NEGATIVE

## 2020-01-09 LAB — APTT: aPTT: 38 seconds — ABNORMAL HIGH (ref 24–36)

## 2020-01-09 LAB — PROCALCITONIN: Procalcitonin: 0.76 ng/mL

## 2020-01-09 MED ORDER — VANCOMYCIN HCL IN DEXTROSE 1-5 GM/200ML-% IV SOLN
1000.0000 mg | Freq: Three times a day (TID) | INTRAVENOUS | Status: DC
  Administered 2020-01-09 – 2020-01-10 (×2): 1000 mg via INTRAVENOUS
  Filled 2020-01-09 (×2): qty 200

## 2020-01-09 MED ORDER — ONDANSETRON HCL 4 MG/2ML IJ SOLN
4.0000 mg | Freq: Once | INTRAMUSCULAR | Status: AC
  Administered 2020-01-09: 4 mg via INTRAVENOUS
  Filled 2020-01-09: qty 2

## 2020-01-09 MED ORDER — SODIUM CHLORIDE 0.9 % IV SOLN
1.0000 g | Freq: Once | INTRAVENOUS | Status: AC
  Administered 2020-01-09: 1 g via INTRAVENOUS
  Filled 2020-01-09: qty 10

## 2020-01-09 MED ORDER — TIZANIDINE HCL 4 MG PO TABS: 4.0000 mg | ORAL_TABLET | Freq: Four times a day (QID) | ORAL | Status: DC | PRN

## 2020-01-09 MED ORDER — VANCOMYCIN HCL 1500 MG/300ML IV SOLN: 1500.0000 mg | Freq: Three times a day (TID) | INTRAVENOUS | Status: DC

## 2020-01-09 MED ORDER — SODIUM CHLORIDE 0.9 % IV BOLUS
1000.0000 mL | Freq: Once | INTRAVENOUS | Status: AC
  Administered 2020-01-09: 1000 mL via INTRAVENOUS

## 2020-01-09 MED ORDER — ONDANSETRON HCL 4 MG/2ML IJ SOLN
4.0000 mg | Freq: Four times a day (QID) | INTRAMUSCULAR | Status: DC | PRN
  Administered 2020-01-09 – 2020-01-13 (×11): 4 mg via INTRAVENOUS
  Filled 2020-01-09 (×11): qty 2

## 2020-01-09 MED ORDER — BUPROPION HCL ER (SR) 150 MG PO TB12
150.0000 mg | ORAL_TABLET | Freq: Two times a day (BID) | ORAL | Status: DC
  Administered 2020-01-09 – 2020-01-13 (×8): 150 mg via ORAL
  Filled 2020-01-09 (×8): qty 1

## 2020-01-09 MED ORDER — SODIUM CHLORIDE 0.9 % IV SOLN
2.0000 g | Freq: Three times a day (TID) | INTRAVENOUS | Status: DC
  Administered 2020-01-09 – 2020-01-12 (×11): 2 g via INTRAVENOUS
  Filled 2020-01-09 (×11): qty 2

## 2020-01-09 MED ORDER — SODIUM CHLORIDE 0.9 % IV SOLN
2.0000 g | Freq: Three times a day (TID) | INTRAVENOUS | Status: DC
  Filled 2020-01-09: qty 2

## 2020-01-09 MED ORDER — GABAPENTIN 600 MG PO TABS
600.0000 mg | ORAL_TABLET | Freq: Three times a day (TID) | ORAL | Status: DC
  Filled 2020-01-09 (×6): qty 1

## 2020-01-09 MED ORDER — ONDANSETRON HCL 4 MG PO TABS: 4.0000 mg | ORAL_TABLET | Freq: Four times a day (QID) | ORAL | Status: DC | PRN

## 2020-01-09 MED ORDER — LACTATED RINGERS IV BOLUS
2000.0000 mL | Freq: Once | INTRAVENOUS | Status: AC
  Administered 2020-01-09: 2000 mL via INTRAVENOUS

## 2020-01-09 MED ORDER — ALPRAZOLAM 0.25 MG PO TABS: 0.2500 mg | ORAL_TABLET | Freq: Every day | ORAL | Status: DC | PRN

## 2020-01-09 MED ORDER — FERROUS SULFATE 325 (65 FE) MG PO TABS
325.0000 mg | ORAL_TABLET | Freq: Every day | ORAL | Status: DC
  Administered 2020-01-10 – 2020-01-13 (×4): 325 mg via ORAL
  Filled 2020-01-09 (×4): qty 1

## 2020-01-09 MED ORDER — DOCUSATE SODIUM 100 MG PO CAPS: 100.0000 mg | ORAL_CAPSULE | Freq: Two times a day (BID) | ORAL | Status: DC | PRN

## 2020-01-09 MED ORDER — MORPHINE SULFATE (PF) 4 MG/ML IV SOLN
4.0000 mg | Freq: Once | INTRAVENOUS | Status: AC
  Administered 2020-01-09: 4 mg via INTRAVENOUS
  Filled 2020-01-09: qty 1

## 2020-01-09 MED ORDER — ENOXAPARIN SODIUM 80 MG/0.8ML ~~LOC~~ SOLN
80.0000 mg | SUBCUTANEOUS | Status: DC
  Administered 2020-01-10: 80 mg via SUBCUTANEOUS
  Filled 2020-01-09: qty 0.8

## 2020-01-09 MED ORDER — IPRATROPIUM-ALBUTEROL 0.5-2.5 (3) MG/3ML IN SOLN
3.0000 mL | Freq: Once | RESPIRATORY_TRACT | Status: AC
  Administered 2020-01-09: 3 mL via RESPIRATORY_TRACT
  Filled 2020-01-09: qty 3

## 2020-01-09 MED ORDER — POTASSIUM CHLORIDE IN NACL 40-0.9 MEQ/L-% IV SOLN: INTRAVENOUS | Status: AC

## 2020-01-09 MED ORDER — LEVOTHYROXINE SODIUM 88 MCG PO TABS
88.0000 ug | ORAL_TABLET | Freq: Every day | ORAL | Status: DC
  Administered 2020-01-10 – 2020-01-13 (×4): 88 ug via ORAL
  Filled 2020-01-09 (×4): qty 1

## 2020-01-09 MED ORDER — BUSPIRONE HCL 5 MG PO TABS
15.0000 mg | ORAL_TABLET | Freq: Two times a day (BID) | ORAL | Status: DC
  Administered 2020-01-09 – 2020-01-13 (×9): 15 mg via ORAL
  Filled 2020-01-09 (×9): qty 3

## 2020-01-09 MED ORDER — OXYBUTYNIN CHLORIDE ER 5 MG PO TB24
5.0000 mg | ORAL_TABLET | Freq: Every day | ORAL | Status: DC
  Administered 2020-01-10 – 2020-01-13 (×4): 5 mg via ORAL
  Filled 2020-01-09 (×4): qty 1

## 2020-01-09 MED ORDER — ACETAMINOPHEN 650 MG RE SUPP: 650.0000 mg | Freq: Four times a day (QID) | RECTAL | Status: DC | PRN

## 2020-01-09 MED ORDER — GABAPENTIN 300 MG PO CAPS
600.0000 mg | ORAL_CAPSULE | Freq: Three times a day (TID) | ORAL | Status: DC
  Administered 2020-01-10 – 2020-01-13 (×9): 600 mg via ORAL
  Filled 2020-01-09 (×9): qty 2

## 2020-01-09 MED ORDER — ACETAMINOPHEN 325 MG PO TABS
650.0000 mg | ORAL_TABLET | Freq: Four times a day (QID) | ORAL | Status: DC | PRN
  Administered 2020-01-10: 650 mg via ORAL
  Filled 2020-01-09: qty 2

## 2020-01-09 MED ORDER — IPRATROPIUM-ALBUTEROL 0.5-2.5 (3) MG/3ML IN SOLN
3.0000 mL | Freq: Four times a day (QID) | RESPIRATORY_TRACT | Status: DC
  Administered 2020-01-09 – 2020-01-11 (×8): 3 mL via RESPIRATORY_TRACT
  Filled 2020-01-09 (×9): qty 3

## 2020-01-09 MED ORDER — PANTOPRAZOLE SODIUM 40 MG PO TBEC
40.0000 mg | DELAYED_RELEASE_TABLET | Freq: Every day | ORAL | Status: DC
  Administered 2020-01-09 – 2020-01-13 (×5): 40 mg via ORAL
  Filled 2020-01-09 (×5): qty 1

## 2020-01-09 MED ORDER — CHLORHEXIDINE GLUCONATE CLOTH 2 % EX PADS
6.0000 | MEDICATED_PAD | Freq: Every day | CUTANEOUS | Status: DC
  Administered 2020-01-10 – 2020-01-12 (×3): 6 via TOPICAL

## 2020-01-09 MED ORDER — CITALOPRAM HYDROBROMIDE 20 MG PO TABS
40.0000 mg | ORAL_TABLET | Freq: Every day | ORAL | Status: DC
  Administered 2020-01-10 – 2020-01-13 (×4): 40 mg via ORAL
  Filled 2020-01-09 (×4): qty 2

## 2020-01-09 MED ORDER — SODIUM CHLORIDE 0.9 % IV SOLN
500.0000 mg | Freq: Once | INTRAVENOUS | Status: AC
  Administered 2020-01-09: 500 mg via INTRAVENOUS
  Filled 2020-01-09: qty 500

## 2020-01-09 MED ORDER — OXYCODONE HCL 5 MG PO TABS
10.0000 mg | ORAL_TABLET | Freq: Three times a day (TID) | ORAL | Status: DC | PRN
  Administered 2020-01-09 – 2020-01-13 (×13): 10 mg via ORAL
  Filled 2020-01-09 (×13): qty 2

## 2020-01-09 MED ORDER — BENZONATATE 100 MG PO CAPS
200.0000 mg | ORAL_CAPSULE | Freq: Three times a day (TID) | ORAL | Status: DC | PRN
  Administered 2020-01-09: 200 mg via ORAL
  Filled 2020-01-09 (×2): qty 2

## 2020-01-09 MED ORDER — VANCOMYCIN HCL 2000 MG/400ML IV SOLN
2000.0000 mg | Freq: Once | INTRAVENOUS | Status: AC
  Administered 2020-01-09: 2000 mg via INTRAVENOUS
  Filled 2020-01-09: qty 400

## 2020-01-09 MED ORDER — VITAMIN B-12 1000 MCG PO TABS
1000.0000 ug | ORAL_TABLET | Freq: Every day | ORAL | Status: DC
  Administered 2020-01-09 – 2020-01-13 (×5): 1000 ug via ORAL
  Filled 2020-01-09 (×5): qty 1

## 2020-01-09 NOTE — ED Notes (Signed)
Pt to ICU 12
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
## 2020-01-09 NOTE — ED Triage Notes (Signed)
Pt complaining of SOB, Nausea, Diarrhea, for a couple days.  Pt states she feels septic

## 2020-01-09 NOTE — Progress Notes (Signed)
Pt admitted to the unit at 1400. Pt mental status is alert, verbal, oriented x4. Pt oriented to room, staff, and call bell. Skin is intact other than sacrum. Full assessment charted in [facility]. Call bell within reach. Visitor guidelines reviewed w/ pt and/or family.

## 2020-01-09 NOTE — Progress Notes (Addendum)
Pharmacy Antibiotic Note  [name] is a 38 y.o. female admitted on 01/09/2020 with pneumonia.  Pharmacy has been consulted for vancomycin and cefepime  dosing.  Plan: Start cefepime 2g IV q8h  Give vancomycin 2g  IV x1 dose, then start vancomycin 1g IV q8h (give early d/t BMI) Goal vancomycin trough range:  15-20  mcg/mL (calculated AUC  of 467.3 mcg*h/mL) Pharmacy will continue to monitor renal function, vancomycin troughs as clinically appropriate,  cultures and patient progress.    Temp (24hrs), Avg:98.4 F (36.9 C), Min:98 F (36.7 C), Max:98.7 F (37.1 C)  Recent Labs  Lab 01/09/20 0943  WBC 28.1*  CREATININE 0.51  LATICACIDVEN 1.5    Estimated Creatinine Clearance: 150.6 mL/min (by C-G formula based on SCr of 0.51 mg/dL).    Allergies  Allergen Reactions  . Macrobid [Nitrofurantoin]     Not effective for patient.     Antimicrobials this admission: vancomycin 11/8>> cefepime 11/8>>   azithromycin 11/8>>x1 cefriaxone 11/8>>x1    Microbiology results: 11/8  [facility] x1:   11/8 Resp PCR: SARS CoV-2 negative; Flu A/B negative 11/8 MRSA PCR: 11/8 UCx:  Thank you for allowing pharmacy to be a part of this patient's care.  Despina Pole 01/09/2020 1:03 PM
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
## 2020-01-09 NOTE — ED Notes (Signed)
Pt positioned for comfort.

## 2020-01-10 DIAGNOSIS — J9601 Acute respiratory failure with hypoxia: Secondary | ICD-10-CM

## 2020-01-10 LAB — BRAIN NATRIURETIC PEPTIDE: B Natriuretic Peptide: 441 pg/mL — ABNORMAL HIGH (ref 0.0–100.0)

## 2020-01-10 LAB — COMPREHENSIVE METABOLIC PANEL
ALT: 15 U/L (ref 0–44)
AST: 11 U/L — ABNORMAL LOW (ref 15–41)
Albumin: 2.5 g/dL — ABNORMAL LOW (ref 3.5–5.0)
Alkaline Phosphatase: 81 U/L (ref 38–126)
Anion gap: 11 (ref 5–15)
BUN: 11 mg/dL (ref 6–20)
CO2: 28 mmol/L (ref 22–32)
Calcium: 7.8 mg/dL — ABNORMAL LOW (ref 8.9–10.3)
Chloride: 102 mmol/L (ref 98–111)
Creatinine, Ser: 0.48 mg/dL (ref 0.44–1.00)
GFR, Estimated: >60 mL/min (ref >60)
Glucose, Bld: 74 mg/dL (ref 70–99)
Potassium: 3.3 mmol/L — ABNORMAL LOW (ref 3.5–5.1)
Sodium: 141 mmol/L (ref 135–145)
Total Bilirubin: 0.3 mg/dL (ref 0.3–1.2)
Total Protein: 6.2 g/dL — ABNORMAL LOW (ref 6.5–8.1)

## 2020-01-10 LAB — CBC
HCT: 36 % (ref 36.0–46.0)
Hemoglobin: 11 g/dL — ABNORMAL LOW (ref 12.0–15.0)
MCH: 28.7 pg (ref 26.0–34.0)
MCHC: 30.6 g/dL (ref 30.0–36.0)
MCV: 94 fL (ref 80.0–100.0)
Platelets: 380 10*3/uL (ref 150–400)
RBC: 3.83 MIL/uL — ABNORMAL LOW (ref 3.87–5.11)
RDW: 16 % — ABNORMAL HIGH (ref 11.5–15.5)
WBC: 18.5 10*3/uL — ABNORMAL HIGH (ref 4.0–10.5)
nRBC: 0 % (ref 0.0–0.2)

## 2020-01-10 LAB — PROCALCITONIN: Procalcitonin: 0.65 ng/mL

## 2020-01-10 MED ORDER — ENOXAPARIN SODIUM 60 MG/0.6ML ~~LOC~~ SOLN
60.0000 mg | SUBCUTANEOUS | Status: DC
  Administered 2020-01-10 – 2020-01-12 (×3): 60 mg via SUBCUTANEOUS
  Filled 2020-01-10 (×3): qty 0.6

## 2020-01-10 MED ORDER — FUROSEMIDE 10 MG/ML IJ SOLN
20.0000 mg | Freq: Once | INTRAMUSCULAR | Status: AC
  Administered 2020-01-10: 20 mg via INTRAVENOUS
  Filled 2020-01-10: qty 2
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
## 2020-01-11 DIAGNOSIS — J9621 Acute and chronic respiratory failure with hypoxia: Secondary | ICD-10-CM

## 2020-01-11 DIAGNOSIS — J189 Pneumonia, unspecified organism: Secondary | ICD-10-CM

## 2020-01-11 LAB — PROCALCITONIN: Procalcitonin: 0.28 ng/mL

## 2020-01-11 MED ORDER — IPRATROPIUM-ALBUTEROL 0.5-2.5 (3) MG/3ML IN SOLN
3.0000 mL | Freq: Three times a day (TID) | RESPIRATORY_TRACT | Status: DC
  Administered 2020-01-12 – 2020-01-13 (×4): 3 mL via RESPIRATORY_TRACT
  Filled 2020-01-11 (×4): qty 3

## 2020-01-11 MED ORDER — GUAIFENESIN-DM 100-10 MG/5ML PO SYRP
5.0000 mL | ORAL_SOLUTION | Freq: Three times a day (TID) | ORAL | Status: DC | PRN
  Administered 2020-01-11 – 2020-01-12 (×3): 5 mL via ORAL
  Filled 2020-01-11 (×3): qty 5

## 2020-01-11 MED ORDER — BENZONATATE 100 MG PO CAPS
200.0000 mg | ORAL_CAPSULE | Freq: Two times a day (BID) | ORAL | Status: DC
  Administered 2020-01-11 – 2020-01-13 (×5): 200 mg via ORAL
  Filled 2020-01-11 (×4): qty 2
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
## 2020-01-12 DIAGNOSIS — R0902 Hypoxemia: Secondary | ICD-10-CM

## 2020-01-12 MED ORDER — POTASSIUM CHLORIDE CRYS ER 20 MEQ PO TBCR
40.0000 meq | EXTENDED_RELEASE_TABLET | Freq: Once | ORAL | Status: AC
  Administered 2020-01-12: 40 meq via ORAL
  Filled 2020-01-12: qty 2
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
## 2020-01-12 NOTE — Progress Notes (Signed)
Pharmacy Antibiotic Note  Chantrice A Hipps is a 39 y.o. female admitted on 01/09/2020 with pneumonia.  Pharmacy has been consulted for  cefepime  dosing.  Plan: Continue cefepime 2g IV q8h Pharmacy will continue to monitor renal function,  cultures and patient progress.  Height: 5\' 6"  (167.6 cm) Weight: 115.3 kg (254 lb 3.1 oz) IBW/kg (Calculated) : 59.3  Temp (24hrs), Avg:98.3 F (36.8 C), Min:98 F (36.7 C), Max:98.4 F (36.9 C)  Recent Labs  Lab 01/09/20 0943 01/10/20 0450  WBC 28.1* 18.5*  CREATININE 0.51 0.48  LATICACIDVEN 1.5  --     Estimated Creatinine Clearance: 124.2 mL/min (by C-G formula based on SCr of 0.48 mg/dL).    Allergies  Allergen Reactions  . Macrobid [Nitrofurantoin]     Not effective for patient.     Antimicrobials this admission: Azithromycin x1 Cefriaxone x1 vancomycin 11/8>>11/9 cefepime 11/8>>      Microbiology results: 11/8  [facility] x1:  ngtd 11/8 MRSA PCR: neg 11/8 Ucx: pending  Thank you for allowing pharmacy to be a part of this patient's care.  Ramond Craver 01/12/2020 10:30 AM

## 2020-01-13 DIAGNOSIS — R0902 Hypoxemia: Secondary | ICD-10-CM

## 2020-01-13 DIAGNOSIS — Z6841 Body Mass Index (BMI) 40.0 and over, adult: Secondary | ICD-10-CM

## 2020-01-13 LAB — BASIC METABOLIC PANEL
Anion gap: 10 (ref 5–15)
BUN: 6 mg/dL (ref 6–20)
CO2: 32 mmol/L (ref 22–32)
Calcium: 8.3 mg/dL — ABNORMAL LOW (ref 8.9–10.3)
Chloride: 97 mmol/L — ABNORMAL LOW (ref 98–111)
Creatinine, Ser: 0.44 mg/dL (ref 0.44–1.00)
GFR, Estimated: >60 mL/min (ref >60)
Glucose, Bld: 101 mg/dL — ABNORMAL HIGH (ref 70–99)
Potassium: 3.1 mmol/L — ABNORMAL LOW (ref 3.5–5.1)
Sodium: 139 mmol/L (ref 135–145)

## 2020-01-13 LAB — CBC
HCT: 38.1 % (ref 36.0–46.0)
Hemoglobin: 11.6 g/dL — ABNORMAL LOW (ref 12.0–15.0)
MCH: 28.4 pg (ref 26.0–34.0)
MCHC: 30.4 g/dL (ref 30.0–36.0)
MCV: 93.2 fL (ref 80.0–100.0)
Platelets: 459 10*3/uL — ABNORMAL HIGH (ref 150–400)
RBC: 4.09 MIL/uL (ref 3.87–5.11)
RDW: 16.3 % — ABNORMAL HIGH (ref 11.5–15.5)
WBC: 10.7 10*3/uL — ABNORMAL HIGH (ref 4.0–10.5)
nRBC: 0 % (ref 0.0–0.2)

## 2020-01-13 MED ORDER — AMOXICILLIN-POT CLAVULANATE 875-125 MG PO TABS: 1.0000 | ORAL_TABLET | Freq: Two times a day (BID) | ORAL | 0 refills | Status: AC

## 2020-01-13 MED ORDER — IPRATROPIUM-ALBUTEROL 0.5-2.5 (3) MG/3ML IN SOLN: 3.0000 mL | Freq: Two times a day (BID) | RESPIRATORY_TRACT | Status: DC

## 2020-01-13 MED ORDER — SULFAMETHOXAZOLE-TRIMETHOPRIM 400-80 MG PO TABS: 1.0000 | ORAL_TABLET | Freq: Every day | ORAL | Status: DC

## 2020-01-13 MED ORDER — AMOXICILLIN-POT CLAVULANATE 875-125 MG PO TABS
1.0000 | ORAL_TABLET | Freq: Two times a day (BID) | ORAL | Status: DC
  Administered 2020-01-13: 1 via ORAL
  Filled 2020-01-13: qty 1

## 2020-01-13 MED ORDER — BENZONATATE 100 MG PO CAPS: 200.0000 mg | ORAL_CAPSULE | Freq: Three times a day (TID) | ORAL | 0 refills | Status: DC | PRN

## 2020-01-13 MED ORDER — IPRATROPIUM-ALBUTEROL 20-100 MCG/ACT IN AERS: 1.0000 | INHALATION_SPRAY | Freq: Four times a day (QID) | RESPIRATORY_TRACT | 1 refills | Status: DC | PRN

## 2020-01-13 MED ORDER — FERROUS SULFATE 325 (65 FE) MG PO TABS
325.0000 mg | ORAL_TABLET | Freq: Every day | ORAL | 1 refills | Status: DC
Start: 2020-01-13 — End: 2021-08-16

## 2020-01-13 NOTE — TOC Transition Note (Signed)
Transition of Care [facility]) - CM/SW Discharge Note   Patient Details  Name: [name] MRN: [id number] Date of Birth: 1980/04/18  Transition of Care [facility]) CM/SW Contact:  Shade Flood, LCSW Phone Number: 01/13/2020, 1:02 PM   Clinical Narrative:     [facility] follow up appointment and added to AVS  Final next level of care: Home/Self Care Barriers to Discharge: Barriers Resolved   Patient Goals and CMS Choice Patient states their goals for this hospitalization and ongoing recovery are:: return home      Discharge Placement                       Discharge Plan and Services In-house Referral: Clinical Social Work              DME Arranged: Oxygen DME Agency: [employer] Date DME Agency Contacted: 01/13/20   Representative spoke with at DME Agency: [facility] Determinants of Health ([place]) Interventions     Readmission Risk Interventions Readmission Risk Prevention Plan 01/13/2020 01/10/2020 01/12/2019  Transportation Screening - Complete (No Data)  PCP or Specialist Appt within 3-5 Days - - -  HRI or [place] Work Consult for [place] - - -  Medication Review [employer] photographer) - Complete -  PCP or Specialist appointment within 3-5 days of discharge Complete - -  HRI or [place] - Complete -  SW Recovery Care/Counseling Consult - Complete -  Palliative Care Screening - Not Applicable -  [place] - Not Applicable -  Some recent data might be hidden

## 2020-01-13 NOTE — TOC Transition Note (Signed)
Transition of Care [facility]) - CM/SW Discharge Note   Patient Details  Name: [name] MRN: [id number] Date of Birth: 1980/08/13  Transition [facility] Care [facility]) CM/SW Contact:  Shade Flood, LCSW Phone Number: 01/13/2020, 12:48 PM   Clinical Narrative:     Pt stable for dc today if Home O2 can be arranged per MD. Referred to [facility] for Home O2 and arrangements can be made today. No other TOC needs identified for dc.  Final next level of care: Home/Self Care Barriers to Discharge: Barriers Resolved   Patient Goals and CMS Choice Patient states their goals for this hospitalization and ongoing recovery are:: return home      Discharge Placement                       Discharge Plan and Services In-house Referral: Clinical Social Work              DME Arranged: Oxygen DME Agency: [facility] Date DME Agency Contacted: 01/13/20   Representative spoke with at DME Agency: Barbaraann Rondo            Social Determinants of Health ([place]) Interventions     Readmission Risk Interventions Readmission Risk Prevention Plan 01/10/2020 01/12/2019 01/12/2019  Transportation Screening Complete (No Data) Complete  PCP or Specialist Appt within 3-5 Days - - Complete  HRI or [place] - - Complete  Social Work Consult for [place] Planning/Counseling - - Complete  Palliative Care Screening - - Not Applicable  Medication Review [employer] photographer) Complete - Complete  HRI or Home Care Consult Complete - -  SW Recovery Care/Counseling Consult Complete - -  Palliative Care Screening Not Applicable - -  [place] Not Applicable - -  Some recent data might be hidden

## 2020-01-13 NOTE — Progress Notes (Signed)
AVS reviewed and given to pt.  Mother present at time.  All questions addressed and answered. IV removed without complication.  Home oxygen taken home by pt's mother.  Pt sacral dressing changed per request.  All belongings gathered.  EMS to be requested for transportation home.
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
## 2020-01-13 NOTE — Progress Notes (Signed)
SATURATION QUALIFICATIONS: (This note is used to comply with regulatory documentation for home oxygen)  Patient Saturations on Room Air at Rest = 95%  Patient Saturations on Room Air with movement/repositioning = 87%  Patient Saturations on 4 Liters of oxygen with movement/repositioning = 97%  Please briefly explain why patient needs home oxygen: Oxygen saturation drop significant while movement in bed performed.  Movements types typical of those patient would need to make at home. Significant shortness of breath noted while moving on room air.

## 2020-01-14 LAB — CULTURE, BLOOD (SINGLE)
Culture: NO GROWTH
Special Requests: ADEQUATE

## 2020-01-16 ENCOUNTER — Ambulatory Visit: Payer: Medicaid Other | Admitting: Family Medicine

## 2020-01-17 ENCOUNTER — Encounter (HOSPITAL_BASED_OUTPATIENT_CLINIC_OR_DEPARTMENT_OTHER): Payer: Medicaid Other | Admitting: Internal Medicine

## 2020-01-18 ENCOUNTER — Encounter: Payer: Self-pay | Admitting: Family Medicine

## 2020-01-19 DIAGNOSIS — T8389XA Other specified complication of genitourinary prosthetic devices, implants and grafts, initial encounter: Secondary | ICD-10-CM | POA: Insufficient documentation

## 2020-01-19 DIAGNOSIS — N3941 Urge incontinence: Secondary | ICD-10-CM | POA: Insufficient documentation

## 2020-01-19 DIAGNOSIS — N368 Other specified disorders of urethra: Secondary | ICD-10-CM | POA: Insufficient documentation

## 2020-01-20 ENCOUNTER — Telehealth: Payer: Self-pay

## 2020-01-20 NOTE — Telephone Encounter (Signed)
Have we received a PA on this ?

## 2020-01-20 NOTE — Telephone Encounter (Signed)
PA completed on 01/02/20 and approved on 11/1 and was good for 6 months.

## 2020-01-20 NOTE — Telephone Encounter (Signed)
Heather called from [facility] to let us know that pt needs new PA for her Oxycodone Rx and that we can write it out for 6 months if we want to so that a new PA isnt required every month.

## 2020-01-31 ENCOUNTER — Encounter (HOSPITAL_BASED_OUTPATIENT_CLINIC_OR_DEPARTMENT_OTHER): Payer: Medicaid Other | Attending: Internal Medicine | Admitting: Internal Medicine

## 2020-01-31 ENCOUNTER — Other Ambulatory Visit: Payer: Self-pay

## 2020-01-31 DIAGNOSIS — Z87891 Personal history of nicotine dependence: Secondary | ICD-10-CM | POA: Diagnosis not present

## 2020-01-31 DIAGNOSIS — L89154 Pressure ulcer of sacral region, stage 4: Secondary | ICD-10-CM | POA: Diagnosis present

## 2020-01-31 DIAGNOSIS — G8222 Paraplegia, incomplete: Secondary | ICD-10-CM | POA: Insufficient documentation

## 2020-01-31 NOTE — Progress Notes (Signed)
[name], [name] ([id number]) Visit Report for 01/31/2020 Arrival Information Details Patient Name: Date of Service: [name], [name] 01/31/2020 3:15 PM Medical Record Number: [id number] Patient Account Number: [id number] Date of Birth/Sex: Treating RN: Jan 01, 1981 (39 y.o. [name], [name] Primary Care [name]: [name] Other Clinician: Referring [name]: Treating [name]/Extender: [name] in Treatment: 7 Visit Information History Since Last Visit Added or deleted any medications: Yes Patient Arrived: Wheel Chair Any new allergies or adverse reactions: No Arrival Time: 15:16 Had a fall or experienced change in No Accompanied By: mother activities of daily living that may affect Transfer Assistance: [name] Lift risk of falls: Patient Identification Verified: Yes Signs or symptoms of abuse/neglect since last visito No Secondary Verification Process Completed: Yes Hospitalized since last visit: Yes Patient Requires Transmission-Based Precautions: No Implantable device outside of the clinic excluding No Patient Has Alerts: No cellular tissue based products placed in the center since last visit: Has Dressing in Place as Prescribed: Yes Pain Present Now: No Electronic Signature(s) Signed: 01/31/2020 5:52:12 PM By: Jamie Hammock RN Entered By: [name] on 01/31/2020 15:31:29 -------------------------------------------------------------------------------- Clinic Level of Care Assessment Details Patient Name: Date of Service: [name], [name] 01/31/2020 3:15 PM Medical Record Number: [id number] Patient Account Number: [id number] Date of Birth/Sex: Treating RN: [birth year] (39 y.o. [name] Primary Care [name]: [name] Other Clinician: Referring [name]: Treating [name]/Extender: [name] in Treatment: [name] Clinic Level of Care Assessment Items TOOL 4 Quantity Score X-  1 0 Use when only an EandM is performed on FOLLOW-UP visit ASSESSMENTS - Nursing Assessment / Reassessment X- 1 10 Reassessment of Co-morbidities (includes updates in patient status) X- 1 5 Reassessment of Adherence to Treatment Plan ASSESSMENTS - Wound and Skin A ssessment / Reassessment X - Simple Wound Assessment / Reassessment - one wound 1 5 '[]'  - 0 Complex Wound Assessment / Reassessment - multiple wounds '[]'  - 0 Dermatologic / Skin Assessment (not related to wound area) ASSESSMENTS - Focused Assessment '[]'  - 0 Circumferential Edema Measurements - multi extremities '[]'  - 0 Nutritional Assessment / Counseling / Intervention '[]'  - 0 Lower Extremity Assessment (monofilament, tuning fork, pulses) '[]'  - 0 Peripheral Arterial Disease Assessment (using hand held doppler) ASSESSMENTS - Ostomy and/or Continence Assessment and Care '[]'  - 0 Incontinence Assessment and Management '[]'  - 0 Ostomy Care Assessment and Management (repouching, etc.) PROCESS - Coordination of Care X - Simple Patient / Family Education for ongoing care 1 15 '[]'  - 0 Complex (extensive) Patient / Family Education for ongoing care X- 1 10 Staff obtains Programmer, systems, Records, T Results / Process Orders est '[]'  - 0 Staff telephones HHA, Nursing Homes / Clarify orders / etc '[]'  - 0 Routine Transfer to another Facility (non-emergent condition) '[]'  - 0 Routine Hospital Admission (non-emergent condition) '[]'  - 0 New Admissions / Biomedical engineer / Ordering NPWT Apligraf, etc. , '[]'  - 0 Emergency Hospital Admission (emergent condition) X- 1 10 Simple Discharge Coordination '[]'  - 0 Complex (extensive) Discharge Coordination PROCESS - Special Needs '[]'  - 0 Pediatric / Minor Patient Management '[]'  - 0 Isolation Patient Management '[]'  - 0 Hearing / Language / Visual special needs '[]'  - 0 Assessment of Community assistance (transportation, D/C planning, etc.) '[]'  - 0 Additional assistance / Altered mentation '[]'  -  0 Support Surface(s) Assessment (bed, cushion, seat, etc.) INTERVENTIONS - Wound Cleansing / Measurement X - Simple Wound Cleansing - one wound 1 5 '[]'  - 0 Complex Wound Cleansing - multiple  wounds X- 1 5 Wound Imaging (photographs - any number of wounds) '[]'  - 0 Wound Tracing (instead of photographs) X- 1 5 Simple Wound Measurement - one wound '[]'  - 0 Complex Wound Measurement - multiple wounds INTERVENTIONS - Wound Dressings '[]'  - 0 Small Wound Dressing one or multiple wounds X- 1 15 Medium Wound Dressing one or multiple wounds '[]'  - 0 Large Wound Dressing one or multiple wounds '[]'  - 0 Application of Medications - topical '[]'  - 0 Application of Medications - injection INTERVENTIONS - Miscellaneous '[]'  - 0 External ear exam '[]'  - 0 Specimen Collection (cultures, biopsies, blood, body fluids, etc.) '[]'  - 0 Specimen(s) / Culture(s) sent or taken to Lab for analysis '[]'  - 0 Patient Transfer (multiple staff / Civil Service fast streamer / Similar devices) '[]'  - 0 Simple Staple / Suture removal (25 or less) '[]'  - 0 Complex Staple / Suture removal (26 or more) '[]'  - 0 Hypo / Hyperglycemic Management (close monitor of Blood Glucose) '[]'  - 0 Ankle / Brachial Index (ABI) - do not check if billed separately X- 1 5 Vital Signs Has the patient been seen at the hospital within the last three years: Yes Total Score: 90 Level Of Care: New/Established - Level 3 Electronic Signature(s) Signed: 01/31/2020 5:56:00 PM By: Jamie Coria RN Entered By: [name] on 01/31/2020 16:02:36 -------------------------------------------------------------------------------- Encounter Discharge Information Details Patient Name: Date of Service: Jamie Boyden A. 01/31/2020 3:15 PM Medical Record Number: [id number] Patient Account Number: [id number] Date of Birth/Sex: Treating RN: 1980/03/16 (39 y.o. [name] Primary Care Josuel Koeppen: [name] Other Clinician: Referring [name]: Treating  [name]/Extender: [name] in Treatment: 187 Encounter Discharge Information Items Discharge Condition: Stable Ambulatory Status: Wheelchair Discharge Destination: Home Transportation: Private Auto Accompanied By: mother Schedule Follow-up Appointment: Yes Clinical Summary of Care: Electronic Signature(s) Signed: 01/31/2020 5:54:32 PM By: Deon Pilling Entered By: Deon Pilling on 01/31/2020 16:18:04 -------------------------------------------------------------------------------- Lower Extremity Assessment Details Patient Name: Date of Service: [name], [name] 01/31/2020 3:15 PM Medical Record Number: [id number] Patient Account Number: [id number] Date of Birth/Sex: Treating RN: January 27, 1981 (39 y.o. [name], [name] Primary Care Nyjai Graff: [name] Other Clinician: Referring Arlington Sigmund: Treating Vivianne Carles/Extender: [name] in Treatment: [place] Signature(s) Signed: 01/31/2020 5:52:12 PM By: Jamie Hammock RN Entered By: [name] on 01/31/2020 15:40:29 -------------------------------------------------------------------------------- Multi Wound Chart Details Patient Name: Date of Service: Jamie Boyden A. 01/31/2020 3:15 PM Medical Record Number: [id number] Patient Account Number: [id number] Date of Birth/Sex: Treating RN: Dec 13, 1980 (39 y.o. [name] Primary Care Natlie Asfour: [name] Other Clinician: Referring Keoki Mchargue: Treating Jediah Horger/Extender: [name] in Treatment: 187 Vital Signs Height(in): 65 Pulse(bpm): 101 Weight(lbs): 201 Blood Pressure(mmHg): 103/73 Body Mass Index(BMI): 33 Temperature(F): 98.1 Respiratory Rate(breaths/min): 18 Photos: [1:No Photos Medial Sacrum] [N/A:N/A N/A] Wound Location: [1:Pressure Injury] [N/A:N/A] Wounding Event: [1:Pressure Ulcer] [N/A:N/A] Primary Etiology: [1:Anemia, Osteomyelitis]  [N/A:N/A] Comorbid History: [1:05/15/2016] [N/A:N/A] Date Acquired: [1:187] [N/A:N/A] Weeks of Treatment: [1:Open] [N/A:N/A] Wound Status: [1:3.5x2x2] [N/A:N/A] Measurements L x W x D (cm) [1:5.498] [N/A:N/A] A (cm) : rea [1:10.996] [N/A:N/A] Volume (cm) : [1:83.30%] [N/A:N/A] % Reduction in A rea: [1:92.40%] [N/A:N/A] % Reduction in Volume: [1:1] Starting Position 1 (o'clock): [1:4] Ending Position 1 (o'clock): [1:1.4] Maximum Distance 1 (cm): [1:Yes] [N/A:N/A] Undermining: [1:Category/Stage IV] [N/A:N/A] Classification: [1:Medium] [N/A:N/A] Exudate A mount: [1:Serosanguineous] [N/A:N/A] Exudate Type: [1:red, brown] [N/A:N/A] Exudate Color: [1:Well defined, not attached] [N/A:N/A] Wound Margin: [1:Large (67-100%)] [N/A:N/A] Granulation A mount: [1:Pink] [N/A:N/A] Granulation Quality: [1:Small (  1-33%)] [N/A:N/A] Necrotic A mount: [1:Fat Layer (Subcutaneous Tissue): Yes N/A] Exposed Structures: [1:Fascia: No Tendon: No Muscle: No Joint: No Bone: No Small (1-33%)] [N/A:N/A] Treatment Notes Wound #1 (Medial Sacrum) 1. Cleanse With Wound Cleanser Soap and water 3. Primary Dressing Applied Other primary dressing (specifiy in notes) 4. Secondary Dressing ABD Pad 5. Secured With Medipore tape Notes saline moistened gauze as primary. Electronic Signature(s) Signed: 01/31/2020 5:54:56 PM By: Linton Ham MD Signed: 01/31/2020 5:56:00 PM By: Jamie Coria RN Entered By: Linton Ham on 01/31/2020 16:22:30 -------------------------------------------------------------------------------- Multi-Disciplinary Care Plan Details Patient Name: Date of Service: [name], [name] 01/31/2020 3:15 PM Medical Record Number: [id number] Patient Account Number: [id number] Date of Birth/Sex: Treating RN: 1980/05/27 (39 y.o. [name] Primary Care Chiara Coltrin: [name] Other Clinician: Referring Zianne Schubring: Treating Wilmar Prabhakar/Extender: [name] in  Treatment: 187 Active Inactive Wound/Skin Impairment Nursing Diagnoses: Impaired tissue integrity Knowledge deficit related to smoking impact on wound healing Knowledge deficit related to ulceration/compromised skin integrity Goals: Patient will demonstrate a reduced rate of smoking or cessation of smoking Date Initiated: 06/27/2016 Date Inactivated: 01/26/2017 Target Resolution Date: 01/08/2017 Goal Status: Unmet Unmet Reason: pt continues to smoke Patient/caregiver will verbalize understanding of skin care regimen Date Initiated: 06/27/2016 Target Resolution Date: 02/25/2020 Goal Status: Active Ulcer/skin breakdown will have a volume reduction of 50% by week 8 Date Initiated: 06/27/2016 Date Inactivated: 12/11/2016 Target Resolution Date: 07/25/2016 Goal Status: Met Interventions: Assess patient/caregiver ability to obtain necessary supplies Assess patient/caregiver ability to perform ulcer/skin care regimen upon admission and as needed Assess ulceration(s) every visit Provide education on smoking Provide education on ulcer and skin care Treatment Activities: Skin care regimen initiated : 06/27/2016 Topical wound management initiated : 06/27/2016 Notes: Electronic Signature(s) Signed: 01/31/2020 5:56:00 PM By: Jamie Coria RN Entered By: [name] on 01/31/2020 15:36:52 -------------------------------------------------------------------------------- Pain Assessment Details Patient Name: Date of Service: [name], [name] 01/31/2020 3:15 PM Medical Record Number: [id number] Patient Account Number: [id number] Date of Birth/Sex: Treating RN: Jun 11, 1980 (39 y.o. [name], [name] Primary Care Shalen Petrak: [name] Other Clinician: Referring Mahala Rommel: Treating Esmee Fallaw/Extender: [name] in Treatment: 187 Active Problems Location of Pain Severity and Description of Pain Patient Has Paino Yes Patient Has Paino Yes Site Locations Pain  Location: Pain in Ulcers With Dressing Change: Yes Duration of the Pain. Constant / Intermittento Intermittent Rate the pain. Current Pain Level: 7 Worst Pain Level: 10 Least Pain Level: 0 Tolerable Pain Level: 7 Character of Pain Describe the Pain: Aching Pain Management and Medication Current Pain Management: Medication: No Cold Application: No Rest: Yes Massage: No Activity: No T.E.N.S.: No Heat Application: No Leg drop or elevation: No Is the Current Pain Management Adequate: Adequate How does your wound impact your activities of daily livingo Sleep: No Bathing: No Appetite: No Relationship With Others: No Bladder Continence: No Emotions: No Bowel Continence: No Work: No Toileting: No Drive: No Dressing: No Hobbies: No Electronic Signature(s) Signed: 01/31/2020 5:52:12 PM By: Jamie Hammock RN Entered By: [name] on 01/31/2020 15:40:25 -------------------------------------------------------------------------------- Patient/Caregiver Education Details Patient Name: Date of Service: [name] 11/30/2021andnbsp3:15 PM Medical Record Number: [id number] Patient Account Number: [id number] Date of Birth/Gender: Treating RN: [birth year] (39 y.o. [name] Primary Care Physician: [name] Other Clinician: Referring Physician: Treating Physician/Extender: [name] in Treatment: 187 Education Assessment Education Provided To: Patient Education Topics Provided Wound/Skin Impairment: Methods: Explain/Verbal Responses: State content correctly Electronic Signature(s) Signed: 01/31/2020 5:56:00 PM By:  Jamie Coria RN Entered By: [name] on 01/31/2020 15:37:11 -------------------------------------------------------------------------------- Wound Assessment Details Patient Name: Date of Service: [name], [name] 01/31/2020 3:15 PM Medical Record Number: [id number] Patient Account Number:  [id number] Date of Birth/Sex: Treating RN: 19-Jul-1980 (39 y.o. [name], [name] Primary Care Altair Appenzeller: [name] Other Clinician: Referring Justyce Baby: Treating Briella Hobday/Extender: [name] in Treatment: 187 Wound Status Wound Number: 1 Primary Etiology: Pressure Ulcer Wound Location: Medial Sacrum Wound Status: Open Wounding Event: Pressure Injury Comorbid History: Anemia, Osteomyelitis Date Acquired: 05/15/2016 Weeks Of Treatment: 187 Clustered Wound: No Wound Measurements Length: (cm) 3.5 Width: (cm) 2 Depth: (cm) 2 Area: (cm) 5.498 Volume: (cm) 10.996 % Reduction in Area: 83.3% % Reduction in Volume: 92.4% Epithelialization: Small (1-33%) Tunneling: No Undermining: Yes Starting Position (o'clock): 1 Ending Position (o'clock): 4 Maximum Distance: (cm) 1.4 Wound Description Classification: Category/Stage IV Wound Margin: Well defined, not attached Exudate Amount: Medium Exudate Type: Serosanguineous Exudate Color: red, brown Foul Odor After Cleansing: No Slough/Fibrino Yes Wound Bed Granulation Amount: Large (67-100%) Exposed Structure Granulation Quality: Pink Fascia Exposed: No Necrotic Amount: Small (1-33%) Fat Layer (Subcutaneous Tissue) Exposed: Yes Necrotic Quality: Adherent Slough Tendon Exposed: No Muscle Exposed: No Joint Exposed: No Bone Exposed: No Treatment Notes Wound #1 (Medial Sacrum) 1. Cleanse With Wound Cleanser Soap and water 3. Primary Dressing Applied Other primary dressing (specifiy in notes) 4. Secondary Dressing ABD Pad 5. Secured With Medipore tape Notes saline moistened gauze as primary. Electronic Signature(s) Signed: 01/31/2020 5:52:12 PM By: Jamie Hammock RN Entered By: [name] on 01/31/2020 15:41:30 -------------------------------------------------------------------------------- Vitals Details Patient Name: Date of Service: Jamie Boyden A. 01/31/2020 3:15  PM Medical Record Number: [id number] Patient Account Number: [id number] Date of Birth/Sex: Treating RN: [birth year] (39 y.o. [name], [name] Primary Care Maryuri Warnke: [name] Other Clinician: Referring Eevee Borbon: Treating Miquela Costabile/Extender: [name] in Treatment: 187 Vital Signs Time Taken: 15:39 Temperature (F): 98.1 Height (in): 65 Pulse (bpm): 101 Weight (lbs): 201 Respiratory Rate (breaths/min): 18 Body Mass Index (BMI): 33.4 Blood Pressure (mmHg): 103/73 Reference Range: 80 - 120 mg / dl Electronic Signature(s) Signed: 01/31/2020 5:52:12 PM By: Jamie Hammock RN Entered By: [name] on 01/31/2020 15:39:44
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
## 2020-02-02 ENCOUNTER — Ambulatory Visit: Payer: Medicaid Other | Admitting: Urology

## 2020-02-03 ENCOUNTER — Ambulatory Visit: Payer: Medicaid Other | Admitting: Urology

## 2020-02-17 ENCOUNTER — Ambulatory Visit (INDEPENDENT_AMBULATORY_CARE_PROVIDER_SITE_OTHER): Payer: Medicaid Other | Admitting: Internal Medicine

## 2020-02-17 ENCOUNTER — Encounter: Payer: Self-pay | Admitting: Internal Medicine

## 2020-02-17 ENCOUNTER — Other Ambulatory Visit: Payer: Self-pay

## 2020-02-17 DIAGNOSIS — J9621 Acute and chronic respiratory failure with hypoxia: Secondary | ICD-10-CM

## 2020-02-17 DIAGNOSIS — R0609 Other forms of dyspnea: Secondary | ICD-10-CM | POA: Insufficient documentation

## 2020-02-17 DIAGNOSIS — R0600 Dyspnea, unspecified: Secondary | ICD-10-CM | POA: Insufficient documentation

## 2020-02-17 NOTE — Patient Instructions (Addendum)
Goal for 02 saturation is keep the oximeter above 90%  Ok to gradually adjust your nocturnal 02 down   Only use your combivent as a rescue medication to be used if you can't catch your breath  Or stop coughing  - The less you use it, the better it will work when you need it. - Ok to use up to 1 puffs  every 4 hours if you must but call for immediate appointment if use goes up over your usual need - Don't leave home without it !!  (think of it like the spare tire for your car)     I very strongly recommend you get the moderna or pfizer vaccine as soon as possible based on your risk of dying from the virus  and the proven safety and benefit of these vaccines against even the delta variant.  This can save your life as well as  those of your loved ones,  especially if they are also not vaccinated.      Please remember to go to the lab and x-ray department at [facility]   for your tests - we will call you with the results when they are available.
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
## 2020-02-22 ENCOUNTER — Encounter: Payer: Self-pay | Admitting: Internal Medicine

## 2020-02-22 NOTE — Assessment & Plan Note (Addendum)
See hosp admit 01/09/20 - seen 02/17/2020 in pulmonary office but did not complete the w/u   Presently more limited by paraparesis than doe, F/u prn when she is willing /able to complete the out pt w/u.  In meantim ok to use combivent prn  - The proper method of use, as well as anticipated side effects, of a soft mist (respimat) inhaler were discussed and demonstrated to the patient using teach back method

## 2020-02-22 NOTE — Assessment & Plan Note (Signed)
Advised on goal of 02 is to keep > 90% at all times and minimize use of 02 if possible due to tendency to nosebleeds with dry nasal 02   F/u ent prn   Advised: Patient is unvaccinated and was informed of the seriousness of COVID 19 infection as a direct risk to lung health as well as safety to close contacts and should continue to wear a facemask in public and minimize exposure to public locations but especially avoid any area or activity where non-close contacts are not observing distancing or wearing an appropriate face mask.  I strongly recommended pt take either of the vaccines available through local drugstores based on updated information on millions of Americans treated with the [employer] products  which have proven both safe and  effective even against the new delta variant.            Each maintenance medication was reviewed in detail including emphasizing most importantly the difference between maintenance and prns and under what circumstances the prns are to be triggered using an action plan format where appropriate.  Total time for H and P, chart review, counseling, reviewing smi (combivent)  device and generating customized AVS unique to this office visit / charting = 54 min

## 2020-02-23 ENCOUNTER — Ambulatory Visit (HOSPITAL_COMMUNITY)
Admission: RE | Admit: 2020-02-23 | Discharge: 2020-02-23 | Disposition: A | Discharge status: Home / Self Care | Payer: Medicaid Other | Source: Ambulatory Visit | Attending: Internal Medicine | Admitting: Internal Medicine

## 2020-02-23 ENCOUNTER — Other Ambulatory Visit: Payer: Self-pay

## 2020-02-23 ENCOUNTER — Other Ambulatory Visit (HOSPITAL_COMMUNITY)
Admission: RE | Admit: 2020-02-23 | Discharge: 2020-02-23 | Disposition: A | Discharge status: Home / Self Care | Payer: Medicaid Other | Source: Ambulatory Visit | Attending: Internal Medicine | Admitting: Internal Medicine

## 2020-02-23 DIAGNOSIS — R0600 Dyspnea, unspecified: Secondary | ICD-10-CM | POA: Insufficient documentation

## 2020-02-23 DIAGNOSIS — R0609 Other forms of dyspnea: Secondary | ICD-10-CM

## 2020-02-23 LAB — CBC WITH DIFFERENTIAL/PLATELET
Abs Immature Granulocytes: 0.03 10*3/uL (ref 0.00–0.07)
Basophils Absolute: 0 10*3/uL (ref 0.0–0.1)
Basophils Relative: 0 %
Eosinophils Absolute: 0.3 10*3/uL (ref 0.0–0.5)
Eosinophils Relative: 3 %
HCT: 42.4 % (ref 36.0–46.0)
Hemoglobin: 13.1 g/dL (ref 12.0–15.0)
Immature Granulocytes: 0 %
Lymphocytes Relative: 29 %
Lymphs Abs: 2.8 10*3/uL (ref 0.7–4.0)
MCH: 29.2 pg (ref 26.0–34.0)
MCHC: 30.9 g/dL (ref 30.0–36.0)
MCV: 94.4 fL (ref 80.0–100.0)
Monocytes Absolute: 0.6 10*3/uL (ref 0.1–1.0)
Monocytes Relative: 7 %
Neutro Abs: 5.9 10*3/uL (ref 1.7–7.7)
Neutrophils Relative %: 61 %
Platelets: 399 10*3/uL (ref 150–400)
RBC: 4.49 MIL/uL (ref 3.87–5.11)
RDW: 14.1 % (ref 11.5–15.5)
WBC: 9.6 10*3/uL (ref 4.0–10.5)
nRBC: 0 % (ref 0.0–0.2)

## 2020-02-23 LAB — BASIC METABOLIC PANEL
Anion gap: 11 (ref 5–15)
BUN: 10 mg/dL (ref 6–20)
CO2: 27 mmol/L (ref 22–32)
Calcium: 8.9 mg/dL (ref 8.9–10.3)
Chloride: 100 mmol/L (ref 98–111)
Creatinine, Ser: 0.49 mg/dL (ref 0.44–1.00)
GFR, Estimated: >60 mL/min (ref >60)
Glucose, Bld: 114 mg/dL — ABNORMAL HIGH (ref 70–99)
Potassium: 4.2 mmol/L (ref 3.5–5.1)
Sodium: 138 mmol/L (ref 135–145)

## 2020-02-23 LAB — D-DIMER, QUANTITATIVE: D-Dimer, Quant: <0.27 ug/mL-FEU (ref 0.00–0.50)

## 2020-02-23 LAB — TSH: TSH: 2.025 u[IU]/mL (ref 0.350–4.500)

## 2020-02-23 LAB — SEDIMENTATION RATE: Sed Rate: 45 mm/hr — ABNORMAL HIGH (ref 0–22)

## 2020-02-23 LAB — BRAIN NATRIURETIC PEPTIDE: B Natriuretic Peptide: 23 pg/mL (ref 0.0–100.0)

## 2020-02-27 ENCOUNTER — Encounter: Payer: Self-pay | Admitting: *Deleted

## 2020-02-28 ENCOUNTER — Other Ambulatory Visit: Payer: Self-pay

## 2020-02-28 ENCOUNTER — Encounter (HOSPITAL_BASED_OUTPATIENT_CLINIC_OR_DEPARTMENT_OTHER): Payer: Medicaid Other | Attending: Internal Medicine | Admitting: Internal Medicine

## 2020-02-28 ENCOUNTER — Telehealth: Payer: Self-pay | Admitting: Internal Medicine

## 2020-02-28 DIAGNOSIS — G8222 Paraplegia, incomplete: Secondary | ICD-10-CM | POA: Insufficient documentation

## 2020-02-28 DIAGNOSIS — L89154 Pressure ulcer of sacral region, stage 4: Secondary | ICD-10-CM | POA: Diagnosis not present

## 2020-02-28 NOTE — Telephone Encounter (Signed)
I checked to see if we had received a surgical clearance form on pt from Dr. Molly Maduro but no form was located.  Attempted to call pt's mother Chyrl Civatte to get more info about what surgery pt is needing to have and also to see if a surgical clearance form was to be faxed to our office by Dr. Les Pou office. Unable to reach. Left message for her to return call.

## 2020-02-29 NOTE — Progress Notes (Signed)
[name], [name] ([id number]) Visit Report for 02/28/2020 Arrival Information Details Patient Name: Date of Service: [name], [name] 02/28/2020 3:00 PM Medical Record Number: [id number] Patient Account Number: [id number] Date of Birth/Sex: Treating RN: 1980-04-12 (39 y.o. [name] Other Clinician: Referring Provider: Treating Provider/Extender: [name] in Treatment: 6 Visit Information History Since Last Visit Added or deleted any medications: No Patient Arrived: Wheel Chair Any new allergies or adverse reactions: No Arrival Time: 14:56 Had a fall or experienced change in No Accompanied By: mother activities of daily living that may affect Transfer Assistance: Manual risk of falls: Patient Identification Verified: Yes Signs or symptoms of abuse/neglect since last visito No Secondary Verification Process Completed: Yes Hospitalized since last visit: No Patient Requires Transmission-Based Precautions: No Implantable device outside of the clinic excluding No Patient Has Alerts: No cellular tissue based products placed in the center since last visit: Has Dressing in Place as Prescribed: Yes Pain Present Now: No Electronic Signature(s) Signed: 02/29/2020 10:14:31 AM By: [name] Entered By: [name] on 02/28/2020 14:56:51 -------------------------------------------------------------------------------- Clinic Level of Care Assessment Details Patient Name: Date of Service: [name] 02/28/2020 3:00 PM Medical Record Number: [id number] Patient Account Number: [id number] Date of Birth/Sex: Treating RN: Jamie 06, 1982 (39 y.o. [name] Other Clinician: Referring Provider: Treating Provider/Extender: [name] in Treatment: 191 Clinic Level of Care Assessment Items TOOL 4 Quantity Score [] - 0 Use  when only an EandM is performed on FOLLOW-UP visit ASSESSMENTS - Nursing Assessment / Reassessment X- 1 10 Reassessment of Co-morbidities (includes updates in patient status) X- 1 5 Reassessment of Adherence to Treatment Plan ASSESSMENTS - Wound and Skin A ssessment / Reassessment X - Simple Wound Assessment / Reassessment - one wound 1 5 [] - 0 Complex Wound Assessment / Reassessment - multiple wounds [] - 0 Dermatologic / Skin Assessment (not related to wound area) ASSESSMENTS - Focused Assessment [] - 0 Circumferential Edema Measurements - multi extremities [] - 0 Nutritional Assessment / Counseling / Intervention [] - 0 Lower Extremity Assessment (monofilament, tuning fork, pulses) [] - 0 Peripheral Arterial Disease Assessment (using hand held doppler) ASSESSMENTS - Ostomy and/or Continence Assessment and Care [] - 0 Incontinence Assessment and Management [] - 0 Ostomy Care Assessment and Management (repouching, etc.) PROCESS - Coordination of Care X - Simple Patient / Family Education for ongoing care 1 15 [] - 0 Complex (extensive) Patient / Family Education for ongoing care X- 1 10 Staff obtains Programmer, systems, Records, T Results / Process Orders est [] - 0 Staff telephones HHA, Nursing Homes / Clarify orders / etc [] - 0 Routine Transfer to another Facility (non-emergent condition) [] - 0 Routine Hospital Admission (non-emergent condition) [] - 0 New Admissions / Biomedical engineer / Ordering NPWT Apligraf, etc. , [] - 0 Emergency Hospital Admission (emergent condition) X- 1 10 Simple Discharge Coordination [] - 0 Complex (extensive) Discharge Coordination PROCESS - Special Needs [] - 0 Pediatric / Minor Patient Management [] - 0 Isolation Patient Management [] - 0 Hearing / Language / Visual special needs [] - 0 Assessment of Community assistance (transportation, D/C planning, etc.) [] - 0 Additional assistance / Altered mentation [] - 0 Support  Surface(s) Assessment (bed, cushion, seat, etc.) INTERVENTIONS - Wound Cleansing / Measurement X - Simple Wound Cleansing - one wound 1 5 [] - 0 Complex Wound Cleansing - multiple wounds X-  1 5 Wound Imaging (photographs - any number of wounds) [] - 0 Wound Tracing (instead of photographs) X- 1 5 Simple Wound Measurement - one wound [] - 0 Complex Wound Measurement - multiple wounds INTERVENTIONS - Wound Dressings X - Small Wound Dressing one or multiple wounds 1 10 [] - 0 Medium Wound Dressing one or multiple wounds [] - 0 Large Wound Dressing one or multiple wounds X- 1 5 Application of Medications - topical [] - 0 Application of Medications - injection INTERVENTIONS - Miscellaneous [] - 0 External ear exam [] - 0 Specimen Collection (cultures, biopsies, blood, body fluids, etc.) [] - 0 Specimen(s) / Culture(s) sent or taken to Lab for analysis X- 1 10 Patient Transfer (multiple staff / Harrel Lemon Lift / Similar devices) [] - 0 Simple Staple / Suture removal (25 or less) [] - 0 Complex Staple / Suture removal (26 or more) [] - 0 Hypo / Hyperglycemic Management (close monitor of Blood Glucose) [] - 0 Ankle / Brachial Index (ABI) - do not check if billed separately X- 1 5 Vital Signs Has the patient been seen at the hospital within the last three years: Yes Total Score: 100 Level Of Care: New/Established - Level 3 Electronic Signature(s) Signed: 02/28/2020 6:35:32 PM By: [name] Entered By: Jamie Gouty on 02/28/2020 15:55:45 -------------------------------------------------------------------------------- Encounter Discharge Information Details Patient Name: Date of Service: Jamie Boyden A. 02/28/2020 3:00 PM Medical Record Number: [id number] Patient Account Number: [id number] Date of Birth/Sex: Treating RN: 1980/11/13 (39 y.o. [name] Primary Care Provider: Hendricks [name] Other Clinician: Referring Provider: Treating  Provider/Extender: [name] in Treatment: [phone] Encounter Discharge Information Items Discharge Condition: Stable Ambulatory Status: Wheelchair Discharge Destination: Home Transportation: Private Auto Accompanied By: mother Schedule Follow-up Appointment: Yes Clinical Summary of Care: Electronic Signature(s) Signed: 02/28/2020 6:26:47 PM By: Jamie Pilling Entered By: Jamie Pilling on 02/28/2020 18:21:18 -------------------------------------------------------------------------------- Lower Extremity Assessment Details Patient Name: Date of Service: [name], Jamie A. 02/28/2020 3:00 PM Medical Record Number: [id number] Patient Account Number: [id number] Date of Birth/Sex: Treating RN: [birth year] (39 y.o. [name] Primary Care Provider: Hendricks [name] Other Clinician: Referring Provider: Treating Provider/Extender: [name] in Treatment: 191 Electronic Signature(s) Signed: 02/28/2020 6:26:47 PM By: Jamie Pilling Entered By: Jamie Pilling on 02/28/2020 15:17:30 -------------------------------------------------------------------------------- Multi Wound Chart Details Patient Name: Date of Service: Jamie Boyden A. 02/28/2020 3:00 PM Medical Record Number: [id number] Patient Account Number: [id number] Date of Birth/Sex: Treating RN: Mar 01, 1981 (39 y.o. [name] Other Clinician: Referring Provider: Treating Provider/Extender: [name] in Treatment: 191 Vital Signs Height(in): 65 Pulse(bpm): 84 Weight(lbs): 201 Blood Pressure(mmHg): 170/92 Body Mass Index(BMI): 33 Temperature(F): 97.8 Respiratory Rate(breaths/min): 18 Photos: [1:No Photos Medial Sacrum] [N/A:N/A N/A] Wound Location: [1:Pressure Injury] [N/A:N/A] Wounding Event: [1:Pressure Ulcer] [N/A:N/A] Primary Etiology: [1:Anemia, Osteomyelitis] [N/A:N/A] Comorbid History:  [1:05/15/2016] [N/A:N/A] Date Acquired: [1:191] [N/A:N/A] Weeks of Treatment: [1:Open] [N/A:N/A] Wound Status: [1:2x2x2.3] [N/A:N/A] Measurements L x W x D (cm) [1:3.142] [N/A:N/A] A (cm) : rea [1:7.226] [N/A:N/A] Volume (cm) : [1:90.40%] [N/A:N/A] % Reduction in A rea: [1:95.00%] [N/A:N/A] % Reduction in Volume: [1:Category/Stage IV] [N/A:N/A] Classification: [1:Large] [N/A:N/A] Exudate A mount: [1:Serosanguineous] [N/A:N/A] Exudate Type: [1:red, brown] [N/A:N/A] Exudate Color: [1:Thickened] [N/A:N/A] Wound Margin: [1:Large (67-100%)] [N/A:N/A] Granulation A mount: [1:Pink] [N/A:N/A] Granulation Quality: [1:None Present (0%)] [N/A:N/A] Necrotic A mount: [1:Fat Layer (Subcutaneous Tissue): Yes N/A] Exposed Structures: [1:Fascia: No Tendon: No Muscle: No Joint: No  Bone: No Small (1-33%)] [N/A:N/A] Treatment Notes Electronic Signature(s) Signed: 02/28/2020 6:35:32 PM By: [name] Signed: 02/29/2020 12:07:59 PM By: Linton Ham MD Entered By: Linton Ham on 02/28/2020 15:59:01 -------------------------------------------------------------------------------- Multi-Disciplinary Care Plan Details Patient Name: Date of Service: [name], Jamie A. 02/28/2020 3:00 PM Medical Record Number: [id number] Patient Account Number: [id number] Date of Birth/Sex: Treating RN: November 26, 1980 (39 y.o. [name] Other Clinician: Referring Provider: Treating Provider/Extender: [name] in Treatment: 191 Active Inactive Pressure Nursing Diagnoses: Knowledge deficit related to management of pressures ulcers Potential for impaired tissue integrity related to pressure, friction, moisture, and shear Goals: Patient will remain free from development of additional pressure ulcers Date Initiated: 06/27/2016 Date Inactivated: 01/17/2019 Target Resolution Date: 12/24/2018 Goal Status: Met Patient/caregiver  will verbalize risk factors for pressure ulcer development Date Initiated: 06/27/2016 Date Inactivated: 07/08/2017 Target Resolution Date: 07/08/2017 Goal Status: Met Patient/caregiver will verbalize understanding of pressure ulcer management Date Initiated: 06/27/2016 Target Resolution Date: 03/27/2020 Goal Status: Active Interventions: Assess: immobility, friction, shearing, incontinence upon admission and as needed Assess offloading mechanisms upon admission and as needed Assess potential for pressure ulcer upon admission and as needed Provide education on pressure ulcers Treatment Activities: Patient referred for pressure reduction/relief devices : 06/27/2016 Pressure reduction/relief device ordered : 06/27/2016 Notes: Wound/Skin Impairment Nursing Diagnoses: Impaired tissue integrity Knowledge deficit related to smoking impact on wound healing Knowledge deficit related to ulceration/compromised skin integrity Goals: Patient will demonstrate a reduced rate of smoking or cessation of smoking Date Initiated: 06/27/2016 Date Inactivated: 01/26/2017 Target Resolution Date: 01/08/2017 Goal Status: Unmet Unmet Reason: pt continues to smoke Patient/caregiver will verbalize understanding of skin care regimen Date Initiated: 06/27/2016 Target Resolution Date: 03/27/2020 Goal Status: Active Ulcer/skin breakdown will have a volume reduction of 50% by week 8 Date Initiated: 06/27/2016 Date Inactivated: 12/11/2016 Target Resolution Date: 07/25/2016 Goal Status: Met Interventions: Assess patient/caregiver ability to obtain necessary supplies Assess patient/caregiver ability to perform ulcer/skin care regimen upon admission and as needed Assess ulceration(s) every visit Provide education on smoking Provide education on ulcer and skin care Treatment Activities: Skin care regimen initiated : 06/27/2016 Topical wound management initiated : 06/27/2016 Notes: Electronic Signature(s) Signed:  02/28/2020 6:35:32 PM By: [name] Entered By: Jamie Gouty on 02/28/2020 15:54:25 -------------------------------------------------------------------------------- Pain Assessment Details Patient Name: Date of Service: Jamie Boyden A. 02/28/2020 3:00 PM Medical Record Number: [id number] Patient Account Number: [id number] Date of Birth/Sex: Treating RN: 1980/09/30 (39 y.o. [name] Other Clinician: Referring Provider: Treating Provider/Extender: [name] in Treatment: [phone] Active Problems Location of Pain Severity and Description of Pain Patient Has Paino No Site Locations Pain Management and Medication Current Pain Management: Electronic Signature(s) Signed: 02/28/2020 6:35:32 PM By: [name] Signed: 02/29/2020 10:14:31 AM By: [name] Entered By: [name] on 02/28/2020 14:57:20 -------------------------------------------------------------------------------- Patient/Caregiver Education Details Patient Name: Date of Service: Karlton Lemon 12/28/2021andnbsp3:00 PM Medical Record Number: [id number] Patient Account Number: [id number] Date of Birth/Gender: Treating RN: [birth year] (39 y.o. [name] Primary Care Physician: [name] Other Clinician: Referring Physician: Treating Physician/Extender: [name] in Treatment: [phone] Education Assessment Education Provided To: Patient Education Topics Provided Pressure: Methods: Explain/Verbal Responses: Reinforcements needed, State content correctly Wound/Skin Impairment: Methods: Explain/Verbal Responses: Reinforcements needed, State content correctly Electronic Signature(s) Signed: 02/28/2020 6:35:32 PM By: [name] Entered By: Jamie Gouty on 02/28/2020  15:54:53 --------------------------------------------------------------------------------  Wound Assessment Details Patient Name: Date of Service: [name], [name] 02/28/2020 3:00 PM Medical Record Number: [id number] Patient Account Number: [id number] Date of Birth/Sex: Treating RN: 12/23/1980 (39 y.o. [name] Other Clinician: Referring Provider: Treating Provider/Extender: [name] in Treatment: 191 Wound Status Wound Number: 1 Primary Etiology: Pressure Ulcer Wound Location: Medial Sacrum Wound Status: Open Wounding Event: Pressure Injury Comorbid History: Anemia, Osteomyelitis Date Acquired: 05/15/2016 Weeks Of Treatment: 191 Clustered Wound: No Wound Measurements Length: (cm) 2 Width: (cm) 2 Depth: (cm) 2.3 Area: (cm) 3.142 Volume: (cm) 7.226 % Reduction in Area: 90.4% % Reduction in Volume: 95% Epithelialization: Small (1-33%) Tunneling: No Undermining: No Wound Description Classification: Category/Stage IV Wound Margin: Thickened Exudate Amount: Large Exudate Type: Serosanguineous Exudate Color: red, brown Foul Odor After Cleansing: No Slough/Fibrino No Wound Bed Granulation Amount: Large (67-100%) Exposed Structure Granulation Quality: Pink Fascia Exposed: No Necrotic Amount: None Present (0%) Fat Layer (Subcutaneous Tissue) Exposed: Yes Tendon Exposed: No Muscle Exposed: No Joint Exposed: No Bone Exposed: No Treatment Notes Wound #1 (Sacrum) Wound Laterality: Medial Cleanser Wound Cleanser Discharge Instruction: Cleanse the wound with wound cleanser prior to applying a clean dressing using gauze sponges, not tissue or cotton balls. Peri-Wound Care Topical Primary Dressing Curity Gauze Sponge Non-Sterile, 4x4 (in/in) Discharge Instruction: anasept moistened gauze packing Secondary Dressing ABD Pad, 5x9 Discharge Instruction: Apply over primary dressing as  directed. Secured With 3M Medipore H Soft Cloth Surgical T 4 x 2 (in/yd) ape Discharge Instruction: Secure dressing with tape as directed. Compression Wrap Compression Stockings Add-Ons Electronic Signature(s) Signed: 02/28/2020 6:26:47 PM By: Jamie Pilling Signed: 02/28/2020 6:35:32 PM By: [name] Entered By: Jamie Pilling on 02/28/2020 15:18:25 -------------------------------------------------------------------------------- Vitals Details Patient Name: Date of Service: Jamie Boyden A. 02/28/2020 3:00 PM Medical Record Number: [id number] Patient Account Number: [id number] Date of Birth/Sex: Treating RN: Jan 01, 1981 (39 y.o. [name] Other Clinician: Referring Provider: Treating Provider/Extender: [name] in Treatment: 191 Vital Signs Time Taken: 14:56 Temperature (F): 97.8 Height (in): 65 Pulse (bpm): 84 Weight (lbs): 201 Respiratory Rate (breaths/min): 18 Body Mass Index (BMI): 33.4 Blood Pressure (mmHg): 170/92 Reference Range: 80 - 120 mg / dl Electronic Signature(s) Signed: 02/29/2020 10:14:31 AM By: [name] Entered By: [name] on 02/28/2020 14:57:14

## 2020-02-29 NOTE — Progress Notes (Signed)
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 palpable bone. We have been using silver alginate packing her mother is changing the dressing she claims to be offloading this. She had a baclofen pump placed to alleviate her spasticity 5/17; the patient has  not been seen in over 2 months. 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 Apparently they started to notice an odor and drainage earlier this month. Patient was admitted to [facility] from 5/3 through 07/12/2019. We do not have any records here. Apparently she was found to have pneumonia, a UTI. She also went underwent a surgical debridement of her lower sacral wound. She comes in today with a really large postoperative wound. This does not have exposed bone but very close. This is right back to where this was a year or 2 ago. They have been using wet-to-dry. She is on an IV antibiotic but is not sure what drugs. We are not sure what home health company they are currently using. We are trying to get records from [facility]. 6/8; this the patient return to clinic 3 weeks ago. She had surgical debridement of the lower sacral wound. She apparently is completed her IV antibiotics although I am not exactly sure what IV antibiotics she had ordered. Her PICC line is come out. Her wound is large but generally clean there still is exposed bone. ooFortunately the area  on the right heel is callused over I cannot prove there is an open area here per 6/22; they are using a wet to dry dressing when we left the clinic last time however they managed to find a box of silver alginate so they are using that with backing gauze. They are still changing this twice a day because of drainage issues. She has Medicaid and they will not be able to replace the want dressing she will have to go back to a wet-to-dry. In spite of this the wound actually looks quite good some improvement in dimension 7/13; using silver alginate. Slight improvement in overall wound volume including depth although there is still undermining. She tells me she is offloading this is much as possible 8/10-Patient back at 1 month, continuing to use silver alginate although she has run  out and therefore using saline wet-to-dry, undermining is minimal, continuing attempts at offloading according to patient 9/21; its been about 9 weeks since I have seen this patient although I note she was seen once in August. Her wounds on the lower sacrum this looks a lot better than the last time I saw this. She is using silver alginate to the wound bed. They only have Medicaid therefore they have to need to pay out-of-pocket for the dressing supplies. This certainly limits options. 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 10/19; 1 month follow-up. Not much change in the wound in fact it is measuring slightly larger. Still using silver alginate which her mother is purchasing off [employer] I believe. Since she was here she had a suprapubic catheter placed because of a lacerated urethra. 11/30; patient has not been here in about 6 weeks. She apparently has had 3 admissions to the hospital for pneumonia in the last 7 months 2 in the last 2 months. She came out of hospital this time with 4 L of oxygen. Her mother is using the Anasept wet-to-dry to the sacral wound and surprisingly this looks somewhat better. The patient states she is pending 24/7 in bed except for going to doctor's appointments this may be helping. She has an appointment with pulmonology on 12/17. She was a heavy smoker for a period of time I wonder whether she has COPD 12/28; patient is doing well she is off her oxygen which may have something to do with chronic Macrobid she was on [hypersensitivity pneumonitis]. She is also stop smoking. In any case she is doing well from a breathing point of view. She is using Anasept wet-to-dry. Wound measurements are slightly better Objective Constitutional Patient is hypertensive.. Pulse regular and within target range for patient.[name] Respirations regular, non-labored and within target range.. Temperature is normal  and within the target range for the patient.[name] Appears in no distress. Vitals Time Taken: 2:56 PM, Height: 65 in, Weight: 201 lbs, BMI: 33.4, Temperature: 97.8 F, Pulse: 84 bpm, Respiratory Rate: 18 breaths/min, Blood Pressure: 170/92 mmHg. Respiratory work of breathing is normal. Bilateral breath sounds are clear and equal in all lobes with no wheezes, rales or rhonchi.. General Notes: Wound exam; sacral wound actually looks quite healthy there is no exposed bone healthy looking granulation tissue. There is no surrounding erythema. No palpable tender Integumentary (Hair, Skin) Wound #1 status is Open. Original cause of wound was Pressure Injury. The wound is located on the Medial Sacrum. The wound measures 2cm length x 2cm width x 2.3cm depth; 3.142cm^2 area and 7.226cm^3 volume. There  is Fat Layer (Subcutaneous Tissue) exposed. There is no tunneling or undermining noted. There is a large amount of serosanguineous drainage noted. The wound margin is thickened. There is large (67-100%) pink granulation within the wound bed. There is no necrotic tissue within the wound bed. Assessment Active Problems ICD-10 Pressure ulcer of sacral region, stage 4 Paraplegia, incomplete Plan Follow-up Appointments: Return appointment in 1 month. Bathing/ Shower/ Hygiene: May shower with protection but do not get wound dressing(s) wet. Off-Loading: Turn and reposition every 2 hours WOUND #1: - Sacrum Wound Laterality: Medial Cleanser: Wound Cleanser 1 x Per Day/30 Days Discharge Instructions: Cleanse the wound with wound cleanser prior to applying a clean dressing using gauze sponges, not tissue or cotton balls. Prim Dressing: Curity Gauze Sponge Non-Sterile, 4x4 (in/in) 1 x Per Day/30 Days ary Discharge Instructions: anasept moistened gauze packing Secondary Dressing: ABD Pad, 5x9 1 x Per Day/30 Days Discharge Instructions: Apply over primary dressing as directed. Secured With: 70M Medipore H Soft  Cloth Surgical T 4 x 2 (in/yd) 1 x Per Day/30 Days ape Discharge Instructions: Secure dressing with tape as directed. 1. Anasept wet-to-dry. 2. Is unfortunate she does not have insurance will cover wound care products 3. Respiratory status much improved wonder if this all was hypersensitivity pneumonitis Electronic Signature(s) Signed: 02/29/2020 12:07:59 PM By: Linton Ham MD Entered By: Linton Ham on 02/28/2020 [id number] -------------------------------------------------------------------------------- SuperBill Details Patient Name: Date of Service: [name] 02/28/2020 Medical Record Number: [id number] Patient Account Number: [id number] Date of Birth/Sex: Treating RN: 05-20-1980 (39 y.o. [name] Primary Care Provider: [name] Other Clinician: Referring Provider: Treating Provider/Extender: Mare Ferrari in Treatment: 191 Diagnosis Coding ICD-10 Codes Code Description L89.154 Pressure ulcer of sacral region, stage 4 G82.22 Paraplegia, incomplete Facility Procedures CPT4 Code: [id number] Description: 99213 - WOUND CARE VISIT-LEV 3 EST PT Modifier: Quantity: 1 Physician Procedures : CPT4 Code Description Modifier [id number] - WC PHYS LEVEL 3 - EST PT ICD-10 Diagnosis Description L89.154 Pressure ulcer of sacral region, stage 4 G82.22 Paraplegia, incomplete Quantity: 1 Electronic Signature(s) Signed: 02/29/2020 12:07:59 PM By: Linton Ham MD Entered By: Linton Ham on 02/28/2020 16:06:24

## 2020-02-29 NOTE — Telephone Encounter (Signed)
Lmtcb for [employer].

## 2020-03-01 ENCOUNTER — Other Ambulatory Visit: Payer: Self-pay

## 2020-03-01 ENCOUNTER — Encounter: Payer: Self-pay | Admitting: Nurse Practitioner

## 2020-03-01 ENCOUNTER — Ambulatory Visit (INDEPENDENT_AMBULATORY_CARE_PROVIDER_SITE_OTHER): Payer: Medicaid Other | Admitting: Nurse Practitioner

## 2020-03-01 VITALS — BP 141/92 | HR 93 | Temp 98.4°F | Ht 66.0 in

## 2020-03-01 DIAGNOSIS — G8929 Other chronic pain: Secondary | ICD-10-CM | POA: Diagnosis not present

## 2020-03-01 DIAGNOSIS — G894 Chronic pain syndrome: Secondary | ICD-10-CM

## 2020-03-01 DIAGNOSIS — K219 Gastro-esophageal reflux disease without esophagitis: Secondary | ICD-10-CM | POA: Diagnosis not present

## 2020-03-01 DIAGNOSIS — T1490XS Injury, unspecified, sequela: Secondary | ICD-10-CM

## 2020-03-01 MED ORDER — GABAPENTIN 600 MG PO TABS: 600.0000 mg | ORAL_TABLET | Freq: Four times a day (QID) | ORAL | 1 refills | Status: DC

## 2020-03-01 MED ORDER — OXYCODONE HCL 10 MG PO TABS: 10.0000 mg | ORAL_TABLET | Freq: Three times a day (TID) | ORAL | 0 refills | Status: DC

## 2020-03-01 MED ORDER — FLUCONAZOLE 150 MG PO TABS: 150.0000 mg | ORAL_TABLET | Freq: Once | ORAL | 0 refills | Status: AC
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
## 2020-03-01 NOTE — Telephone Encounter (Signed)
Patient's mom called in and was asking about the labs and xray from 02/23/20 which a letter was sent with Dr. Thurston Hole recommendations. There are none. The patient's mom was asking about a surgical clearance. I informed her that we need from the doctor doing the procedure a surgical clearance form for Dr. Sherene Sires to fill out. And they can fax it to office and we will get back to her when Dr. Sherene Sires fills out.   Gave fax number to [facility] and explained we are closed at 12pm today and all day tomorrow due to the holiday we will return to office January 3 at 8am.  She voiced understanding.  Nothing further needed at this time.

## 2020-03-01 NOTE — Assessment & Plan Note (Signed)
Chronic pain controlled on current medication oxycodone 10 mg tablet by mouth 3 times daily as needed.  No changes to current dose Continue to provide education to patient, printed handouts given on pain management.  Rx refill sent to pharmacy.

## 2020-03-01 NOTE — Patient Instructions (Signed)
Chronic Back Pain When back pain lasts longer than 3 months, it is called chronic back pain.The cause of your back pain may not be known. Some common causes include:  Wear and tear (degenerative disease) of the bones, ligaments, or disks in your back.  Inflammation and stiffness in your back (arthritis). People who have chronic back pain often go through certain periods in which the pain is more intense (flare-ups). Many people can learn to manage the pain with home care. Follow these instructions at home: Pay attention to any changes in your symptoms. Take these actions to help with your pain: Activity   Avoid bending and other activities that make the problem worse.  Maintain a proper position when standing or sitting: ? When standing, keep your upper back and neck straight, with your shoulders pulled back. Avoid slouching. ? When sitting, keep your back straight and relax your shoulders. Do not round your shoulders or pull them backward.  Do not sit or stand in one place for long periods of time.  Take brief periods of rest throughout the day. This will reduce your pain. Resting in a lying or standing position is usually better than sitting to rest.  When you are resting for longer periods, mix in some mild activity or stretching between periods of rest. This will help to prevent stiffness and pain.  Get regular exercise. Ask your health care provider what activities are safe for you.  Do not lift anything that is heavier than 10 lb (4.5 kg). Always use proper lifting technique, which includes: ? Bending your knees. ? Keeping the load close to your body. ? Avoiding twisting.  Sleep on a firm mattress in a comfortable position. Try lying on your side with your knees slightly bent. If you lie on your back, put a pillow under your knees. Managing pain  If directed, apply ice to the painful area. Your health care provider may recommend applying ice during the first 24-48 hours after  a flare-up begins. ? Put ice in a plastic bag. ? Place a towel between your skin and the bag. ? Leave the ice on for 20 minutes, 2-3 times per day.  If directed, apply heat to the affected area as often as told by your health care provider. Use the heat source that your health care provider recommends, such as a moist heat pack or a heating pad. ? Place a towel between your skin and the heat source. ? Leave the heat on for 20-30 minutes. ? Remove the heat if your skin turns bright red. This is especially important if you are unable to feel pain, heat, or cold. You may have a greater risk of getting burned.  Try soaking in a warm tub.  Take over-the-counter and prescription medicines only as told by your health care provider.  Keep all follow-up visits as told by your health care provider. This is important. Contact a health care provider if:  You have pain that is not relieved with rest or medicine. Get help right away if:  You have weakness or numbness in one or both of your legs or feet.  You have trouble controlling your bladder or your bowels.  You have nausea or vomiting.  You have pain in your abdomen.  You have shortness of breath or you faint. This information is not intended to replace advice given to you by your health care provider. Make sure you discuss any questions you have with your health care provider. Document Revised: 06/10/2018   Document Reviewed: 08/27/2016 Elsevier Patient Education  2020 [employer]  

## 2020-03-01 NOTE — Assessment & Plan Note (Signed)
GERD well-controlled on current medication.  Prilosec 20 mg capsule 1 tablet by mouth daily.  Continue healthy diet.

## 2020-03-06 ENCOUNTER — Encounter: Payer: Self-pay | Admitting: *Deleted

## 2020-03-06 ENCOUNTER — Telehealth: Payer: Self-pay | Admitting: Internal Medicine

## 2020-03-06 NOTE — Telephone Encounter (Signed)
Received surgical clearance request faxed by [facility] and placed in Dr Thurston Hole lookat to be signed, thanks.

## 2020-03-06 NOTE — Telephone Encounter (Signed)
done

## 2020-03-12 ENCOUNTER — Other Ambulatory Visit: Payer: Self-pay | Admitting: Family Medicine

## 2020-03-27 ENCOUNTER — Encounter (HOSPITAL_BASED_OUTPATIENT_CLINIC_OR_DEPARTMENT_OTHER): Payer: Medicaid Other | Attending: Internal Medicine | Admitting: Internal Medicine

## 2020-03-27 ENCOUNTER — Other Ambulatory Visit: Payer: Self-pay

## 2020-03-27 DIAGNOSIS — G8222 Paraplegia, incomplete: Secondary | ICD-10-CM | POA: Diagnosis not present

## 2020-03-27 DIAGNOSIS — L89154 Pressure ulcer of sacral region, stage 4: Secondary | ICD-10-CM | POA: Insufficient documentation

## 2020-03-28 NOTE — Progress Notes (Signed)
KEARA, PAGLIARULO ([id number]) Visit Report for 03/27/2020 Arrival Information Details Patient Name: Date of Service: [name], [name] 03/27/2020 3:15 PM Medical Record Number: [id number] Patient Account Number: [id number] Date of Birth/Sex: Treating RN: Sep 21, 1980 (40 y.o. [name] Primary Care Starlin Steib: [name] Other Clinician: Referring Kari Kerth: Treating Tom Ragsdale/Extender: Mare Ferrari in Treatment: 103 Visit Information History Since Last Visit Added or deleted any medications: No Patient Arrived: Wheel Chair Any new allergies or adverse reactions: No Arrival Time: 15:35 Had a fall or experienced change in No Accompanied By: mother activities of daily living that may affect Transfer Assistance: Harrel Lemon Lift risk of falls: Patient Identification Verified: Yes Signs or symptoms of abuse/neglect since last visito No Secondary Verification Process Completed: Yes Hospitalized since last visit: No Patient Requires Transmission-Based Precautions: No Implantable device outside of the clinic excluding No Patient Has Alerts: No cellular tissue based products placed in the center since last visit: Has Dressing in Place as Prescribed: Yes Pain Present Now: No Electronic Signature(s) Signed: 03/27/2020 5:42:27 PM By: Baruch Gouty RN, BSN Entered By: Baruch Gouty on 03/27/2020 15:35:53 -------------------------------------------------------------------------------- Clinic Level of Care Assessment Details Patient Name: Date of Service: [name] 03/27/2020 3:15 PM Medical Record Number: [id number] Patient Account Number: [id number] Date of Birth/Sex: Treating RN: 1980-06-16 (40 y.o. [name], [name] Primary Care Eyva Califano: [name] Other Clinician: Referring Conner Neiss: Treating Rayshawn Maney/Extender: Mare Ferrari in Treatment: [facility] Level of Care Assessment Items TOOL 4 Quantity Score X- 1  0 Use when only an EandM is performed on FOLLOW-UP visit ASSESSMENTS - Nursing Assessment / Reassessment X- 1 10 Reassessment of Co-morbidities (includes updates in patient status) X- 1 5 Reassessment of Adherence to Treatment Plan ASSESSMENTS - Wound and Skin A ssessment / Reassessment X - Simple Wound Assessment / Reassessment - one wound 1 5 '[]'  - 0 Complex Wound Assessment / Reassessment - multiple wounds X- 1 10 Dermatologic / Skin Assessment (not related to wound area) ASSESSMENTS - Focused Assessment '[]'  - 0 Circumferential Edema Measurements - multi extremities X- 1 10 Nutritional Assessment / Counseling / Intervention '[]'  - 0 Lower Extremity Assessment (monofilament, tuning fork, pulses) '[]'  - 0 Peripheral Arterial Disease Assessment (using hand held doppler) ASSESSMENTS - Ostomy and/or Continence Assessment and Care '[]'  - 0 Incontinence Assessment and Management '[]'  - 0 Ostomy Care Assessment and Management (repouching, etc.) PROCESS - Coordination of Care X - Simple Patient / Family Education for ongoing care 1 15 '[]'  - 0 Complex (extensive) Patient / Family Education for ongoing care X- 1 10 Staff obtains Programmer, systems, Records, T Results / Process Orders est '[]'  - 0 Staff telephones HHA, Nursing Homes / Clarify orders / etc '[]'  - 0 Routine Transfer to another Facility (non-emergent condition) '[]'  - 0 Routine Hospital Admission (non-emergent condition) '[]'  - 0 New Admissions / Biomedical engineer / Ordering NPWT Apligraf, etc. , '[]'  - 0 Emergency Hospital Admission (emergent condition) X- 1 10 Simple Discharge Coordination '[]'  - 0 Complex (extensive) Discharge Coordination PROCESS - Special Needs '[]'  - 0 Pediatric / Minor Patient Management '[]'  - 0 Isolation Patient Management '[]'  - 0 Hearing / Language / Visual special needs '[]'  - 0 Assessment of Community assistance (transportation, D/C planning, etc.) '[]'  - 0 Additional assistance / Altered mentation '[]'  -  0 Support Surface(s) Assessment (bed, cushion, seat, etc.) INTERVENTIONS - Wound Cleansing / Measurement X - Simple Wound Cleansing - one wound 1 5 '[]'  - 0 Complex Wound Cleansing -  multiple wounds X- 1 5 Wound Imaging (photographs - any number of wounds) '[]'  - 0 Wound Tracing (instead of photographs) X- 1 5 Simple Wound Measurement - one wound '[]'  - 0 Complex Wound Measurement - multiple wounds INTERVENTIONS - Wound Dressings X - Small Wound Dressing one or multiple wounds 1 10 '[]'  - 0 Medium Wound Dressing one or multiple wounds '[]'  - 0 Large Wound Dressing one or multiple wounds X- 1 5 Application of Medications - topical '[]'  - 0 Application of Medications - injection INTERVENTIONS - Miscellaneous '[]'  - 0 External ear exam '[]'  - 0 Specimen Collection (cultures, biopsies, blood, body fluids, etc.) '[]'  - 0 Specimen(s) / Culture(s) sent or taken to Lab for analysis '[]'  - 0 Patient Transfer (multiple staff / Civil Service fast streamer / Similar devices) '[]'  - 0 Simple Staple / Suture removal (25 or less) '[]'  - 0 Complex Staple / Suture removal (26 or more) '[]'  - 0 Hypo / Hyperglycemic Management (close monitor of Blood Glucose) '[]'  - 0 Ankle / Brachial Index (ABI) - do not check if billed separately X- 1 5 Vital Signs Has the patient been seen at the hospital within the last three years: Yes Total Score: 110 Level Of Care: New/Established - Level 3 Electronic Signature(s) Signed: 03/28/2020 5:56:51 PM By: Rhae Hammock RN Entered By: Rhae Hammock on 03/27/2020 16:37:12 -------------------------------------------------------------------------------- Encounter Discharge Information Details Patient Name: Date of Service: Jamie Boyden A. 03/27/2020 3:15 PM Medical Record Number: [id number] Patient Account Number: [id number] Date of Birth/Sex: Treating RN: Nov 16, 1980 (40 y.o. [name] Primary Care Damione Robideau: [name] Other Clinician: Referring Hiyab Nhem: Treating  Willoughby Doell/Extender: Mare Ferrari in Treatment: 195 Encounter Discharge Information Items Discharge Condition: Stable Ambulatory Status: Wheelchair Discharge Destination: Home Transportation: Private Auto Accompanied By: mother Schedule Follow-up Appointment: Yes Clinical Summary of Care: Electronic Signature(s) Signed: 03/27/2020 6:32:06 PM By: Deon Pilling Entered By: Deon Pilling on 03/27/2020 17:58:42 -------------------------------------------------------------------------------- Lower Extremity Assessment Details Patient Name: Date of Service: LADINA, SHUTTERS 03/27/2020 3:15 PM Medical Record Number: [id number] Patient Account Number: [id number] Date of Birth/Sex: Treating RN: 10-Nov-1980 (40 y.o. [name] Primary Care Nayzeth Altman: [name] Other Clinician: Referring Shandra Szymborski: Treating Ammie Warrick/Extender: Mare Ferrari in Treatment: 195 Electronic Signature(s) Signed: 03/27/2020 5:42:27 PM By: Baruch Gouty RN, BSN Entered By: Baruch Gouty on 03/27/2020 15:37:03 -------------------------------------------------------------------------------- Multi Wound Chart Details Patient Name: Date of Service: Jamie Boyden A. 03/27/2020 3:15 PM Medical Record Number: [id number] Patient Account Number: [id number] Date of Birth/Sex: Treating RN: Mar 08, 1980 (40 y.o. [name], [name] Primary Care Fahima Cifelli: [name] Other Clinician: Referring Alayzha An: Treating Sabrie Moritz/Extender: Mare Ferrari in Treatment: 195 Vital Signs Height(in): 65 Pulse(bpm): 70 Weight(lbs): 201 Blood Pressure(mmHg): 106/73 Body Mass Index(BMI): 33 Temperature(F): 98.5 Respiratory Rate(breaths/min): 18 Photos: [1:No Photos Medial Sacrum] [N/A:N/A N/A] Wound Location: [1:Pressure Injury] [N/A:N/A] Wounding Event: [1:Pressure Ulcer] [N/A:N/A] Primary Etiology: [1:Anemia, Osteomyelitis]  [N/A:N/A] Comorbid History: [1:05/15/2016] [N/A:N/A] Date Acquired: [1:195] [N/A:N/A] Weeks of Treatment: [1:Open] [N/A:N/A] Wound Status: [1:2.1x2x2.5] [N/A:N/A] Measurements L x W x D (cm) [1:3.299] [N/A:N/A] A (cm) : rea [1:8.247] [N/A:N/A] Volume (cm) : [1:90.00%] [N/A:N/A] % Reduction in A rea: [1:94.30%] [N/A:N/A] % Reduction in Volume: [1:Category/Stage IV] [N/A:N/A] Classification: [1:Medium] [N/A:N/A] Exudate A mount: [1:Serosanguineous] [N/A:N/A] Exudate Type: [1:red, brown] [N/A:N/A] Exudate Color: [1:Well defined, not attached] [N/A:N/A] Wound Margin: [1:Large (67-100%)] [N/A:N/A] Granulation A mount: [1:Red, Pink] [N/A:N/A] Granulation Quality: [1:None Present (0%)] [N/A:N/A] Necrotic A mount: [1:Fat Layer (Subcutaneous Tissue): Yes N/A] Exposed Structures: [  1:Fascia: No Tendon: No Muscle: No Joint: No Bone: No Small (1-33%)] [N/A:N/A] Treatment Notes Electronic Signature(s) Signed: 03/27/2020 5:14:25 PM By: Linton Ham MD Signed: 03/28/2020 5:56:51 PM By: Rhae Hammock RN Entered By: Linton Ham on 03/27/2020 17:10:32 -------------------------------------------------------------------------------- Multi-Disciplinary Care Plan Details Patient Name: Date of Service: SHEVAUN, LOVAN A. 03/27/2020 3:15 PM Medical Record Number: [id number] Patient Account Number: [id number] Date of Birth/Sex: Treating RN: 1981/02/05 (40 y.o. [name], [name] Primary Care Francene Mcerlean: [name] Other Clinician: Referring Divina Neale: Treating Tarrin Lebow/Extender: Mare Ferrari in Treatment: 195 Active Inactive Pressure Nursing Diagnoses: Knowledge deficit related to management of pressures ulcers Potential for impaired tissue integrity related to pressure, friction, moisture, and shear Goals: Patient will remain free from development of additional pressure ulcers Date Initiated: 06/27/2016 Date Inactivated: 01/17/2019 Target Resolution Date:  12/24/2018 Goal Status: Met Patient/caregiver will verbalize risk factors for pressure ulcer development Date Initiated: 06/27/2016 Date Inactivated: 07/08/2017 Target Resolution Date: 07/08/2017 Goal Status: Met Patient/caregiver will verbalize understanding of pressure ulcer management Date Initiated: 06/27/2016 Target Resolution Date: 05/04/2020 Goal Status: Active Interventions: Assess: immobility, friction, shearing, incontinence upon admission and as needed Assess offloading mechanisms upon admission and as needed Assess potential for pressure ulcer upon admission and as needed Provide education on pressure ulcers Treatment Activities: Patient referred for pressure reduction/relief devices : 06/27/2016 Pressure reduction/relief device ordered : 06/27/2016 Notes: Wound/Skin Impairment Nursing Diagnoses: Impaired tissue integrity Knowledge deficit related to smoking impact on wound healing Knowledge deficit related to ulceration/compromised skin integrity Goals: Patient will demonstrate a reduced rate of smoking or cessation of smoking Date Initiated: 06/27/2016 Date Inactivated: 01/26/2017 Target Resolution Date: 01/08/2017 Goal Status: Unmet Unmet Reason: pt continues to smoke Patient/caregiver will verbalize understanding of skin care regimen Date Initiated: 06/27/2016 Target Resolution Date: 05/04/2020 Goal Status: Active Ulcer/skin breakdown will have a volume reduction of 50% by week 8 Date Initiated: 06/27/2016 Date Inactivated: 12/11/2016 Target Resolution Date: 07/25/2016 Goal Status: Met Interventions: Assess patient/caregiver ability to obtain necessary supplies Assess patient/caregiver ability to perform ulcer/skin care regimen upon admission and as needed Assess ulceration(s) every visit Provide education on smoking Provide education on ulcer and skin care Treatment Activities: Skin care regimen initiated : 06/27/2016 Topical wound management initiated :  06/27/2016 Notes: Electronic Signature(s) Signed: 03/28/2020 5:56:51 PM By: Rhae Hammock RN Entered By: Rhae Hammock on 03/27/2020 16:35:32 -------------------------------------------------------------------------------- Pain Assessment Details Patient Name: Date of Service: Jamie Boyden A. 03/27/2020 3:15 PM Medical Record Number: [id number] Patient Account Number: [id number] Date of Birth/Sex: Treating RN: 04/26/1980 (40 y.o. [name] Primary Care Sora Vrooman: [name] Other Clinician: Referring Syndey Jaskolski: Treating Huckleberry Martinson/Extender: Mare Ferrari in Treatment: 195 Active Problems Location of Pain Severity and Description of Pain Patient Has Paino No Site Locations Rate the pain. Current Pain Level: 0 Pain Management and Medication Current Pain Management: Electronic Signature(s) Signed: 03/27/2020 5:42:27 PM By: Baruch Gouty RN, BSN Entered By: Baruch Gouty on 03/27/2020 15:36:49 -------------------------------------------------------------------------------- Patient/Caregiver Education Details Patient Name: Date of Service: Karlton Lemon 1/25/2022andnbsp3:15 PM Medical Record Number: [id number] Patient Account Number: [id number] Date of Birth/Gender: Treating RN: 08/11/1980 (40 y.o. Benjaman Lobe Primary Care Physician: [name] Other Clinician: Referring Physician: Treating Physician/Extender: Mare Ferrari in Treatment: 195 Education Assessment Education Provided To: Patient Education Topics Provided Pressure: Methods: Explain/Verbal Responses: State content correctly Electronic Signature(s) Signed: 03/28/2020 5:56:51 PM By: Rhae Hammock RN Entered By: Rhae Hammock on 03/27/2020 16:35:50 -------------------------------------------------------------------------------- Wound Assessment Details Patient Name: Date of Service:  ELEA, HOLTZCLAW L A.  03/27/2020 3:15 PM Medical Record Number: [id number] Patient Account Number: [id number] Date of Birth/Sex: Treating RN: 07/10/1980 (40 y.o. [name] Primary Care Scotlynn Noyes: [name] Other Clinician: Referring Lanis Storlie: Treating Adya Wirz/Extender: Mare Ferrari in Treatment: 195 Wound Status Wound Number: 1 Primary Etiology: Pressure Ulcer Wound Location: Medial Sacrum Wound Status: Open Wounding Event: Pressure Injury Comorbid History: Anemia, Osteomyelitis Date Acquired: 05/15/2016 Weeks Of Treatment: 195 Clustered Wound: No Wound Measurements Length: (cm) 2.1 Width: (cm) 2 Depth: (cm) 2.5 Area: (cm) 3.299 Volume: (cm) 8.247 % Reduction in Area: 90% % Reduction in Volume: 94.3% Epithelialization: Small (1-33%) Tunneling: No Undermining: No Wound Description Classification: Category/Stage IV Wound Margin: Well defined, not attached Exudate Amount: Medium Exudate Type: Serosanguineous Exudate Color: red, brown Foul Odor After Cleansing: No Slough/Fibrino No Wound Bed Granulation Amount: Large (67-100%) Exposed Structure Granulation Quality: Red, Pink Fascia Exposed: No Necrotic Amount: None Present (0%) Fat Layer (Subcutaneous Tissue) Exposed: Yes Tendon Exposed: No Muscle Exposed: No Joint Exposed: No Bone Exposed: No Treatment Notes Wound #1 (Sacrum) Wound Laterality: Medial Cleanser Wound Cleanser Discharge Instruction: Cleanse the wound with wound cleanser prior to applying a clean dressing using gauze sponges, not tissue or cotton balls. Peri-Wound Care Topical Primary Dressing Curity Gauze Sponge Non-Sterile, 4x4 (in/in) Discharge Instruction: anasept moistened gauze packing Secondary Dressing ABD Pad, 5x9 Discharge Instruction: Apply over primary dressing as directed. Secured With 63M Medipore H Soft Cloth Surgical T 4 x 2 (in/yd) ape Discharge Instruction: Secure dressing with tape as directed. Compression  Wrap Compression Stockings Add-Ons Electronic Signature(s) Signed: 03/27/2020 5:42:27 PM By: Baruch Gouty RN, BSN Entered By: Baruch Gouty on 03/27/2020 15:53:57 -------------------------------------------------------------------------------- Vitals Details Patient Name: Date of Service: Jamie Boyden A. 03/27/2020 3:15 PM Medical Record Number: [id number] Patient Account Number: [id number] Date of Birth/Sex: Treating RN: 12-10-1980 (40 y.o. [name], [name] Primary Care Keymora Grillot: [name] Other Clinician: Referring Kaarin Pardy: Treating Gurman Ashland/Extender: Mare Ferrari in Treatment: 195 Vital Signs Time Taken: 15:35 Temperature (F): 98.5 Height (in): 65 Pulse (bpm): 70 Source: Stated Respiratory Rate (breaths/min): 18 Weight (lbs): 201 Blood Pressure (mmHg): 106/73 Source: Stated Reference Range: 80 - 120 mg / dl Body Mass Index (BMI): 33.4 Electronic Signature(s) Signed: 03/27/2020 5:42:27 PM By: Baruch Gouty RN, BSN Entered By: Baruch Gouty on 03/27/2020 15:38:35

## 2020-03-28 NOTE — Progress Notes (Signed)
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 appear that her wound is actually doing significantly better even compared to last time we saw her. This is in regard to the medial sacral region. 2/5; I have not seen this wound in almost 3 months. Certainly has gotten better in terms of surface area although there is significant direct depth. She has completed antibiotics for the underlying osteomyelitis. 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 She has been using silver alginate. Her mother is changing the dressing. She claims to be offloading this area except for when she gets up to go to doctors appointments 3/12; this is a punched-out wound on  the lower sacrum. Has a depth of about 1.8 cm. It does not appear to undermine. There is no palpable bone. We have been using silver alginate packing her mother is changing the dressing she claims to be offloading this. She had a baclofen pump placed to alleviate her spasticity 5/17; the patient has not been seen in over 2 months. 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 Apparently they started to notice an odor and drainage earlier this month. Patient was admitted to [facility] from 5/3 through 07/12/2019. We do not have any records here. Apparently she was found to have pneumonia, a UTI. She also went underwent a surgical debridement of her lower sacral wound. She comes in today with a really large postoperative wound. This does not have exposed bone but very close. This is right back to where this was a year or 2 ago. They have been using wet-to-dry. She is on an IV antibiotic but is not sure what drugs. We are not sure what home health company they are currently using. We are trying to get  records from [place]. 6/8; this the patient return to clinic 3 weeks ago. She had surgical debridement of the lower sacral wound. She apparently is completed her IV antibiotics although I am not exactly sure what IV antibiotics she had ordered. Her PICC line is come out. Her wound is large but generally clean there still is exposed bone. ooFortunately the area on the right heel is callused over I cannot prove there is an open area here per 6/22; they are using a wet to dry dressing when we left the clinic last time however they managed to find a box of silver alginate so they are using that with backing gauze. They are still changing this twice a day because of drainage issues. She has Medicaid and they will not be able to replace the want dressing she will have to go back to a wet-to-dry. In spite of this the wound actually looks quite  good some improvement in dimension 7/13; using silver alginate. Slight improvement in overall wound volume including depth although there is still undermining. She tells me she is offloading this is much as possible 8/10-Patient back at 1 month, continuing to use silver alginate although she has run out and therefore using saline wet-to-dry, undermining is minimal, continuing attempts at offloading according to patient 9/21; its been about 9 weeks since I have seen this patient although I note she was seen once in August. Her wounds on the lower sacrum this looks a lot better than the last time I saw this. She is using silver alginate to the wound bed. They only have Medicaid therefore they have to need to pay out-of-pocket for the dressing supplies. This certainly limits options. 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 10/19; 1 month follow-up. Not much change in the wound in fact it is measuring slightly larger. Still using silver alginate which her mother is purchasing off [employer] I believe. Since she was here she had a suprapubic catheter placed because of a lacerated urethra. 11/30; patient has not been here in about 6 weeks. She apparently has had 3 admissions to the hospital for pneumonia in the last 7 months 2 in the last 2 months. She came out of hospital this time with 4 L of oxygen. Her mother is using the Anasept wet-to-dry to the sacral wound and surprisingly this looks somewhat better. The patient states she is pending 24/7 in bed except for going to doctor's appointments this may be helping. She has an appointment with pulmonology on 12/17. She was a heavy smoker for a period of time I wonder whether she has COPD 12/28; patient is doing well she is off her oxygen which may have something to do with chronic Macrobid she was on [hypersensitivity pneumonitis]. She is also stop smoking. In any case she is doing  well from a breathing point of view. She is using Anasept wet-to-dry. 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 She has been using Anasept gel wet-to-dry and really has done a nice job here. She does not have insurance for other wound care products but truthfully so far this is done a really good job. I note she is back on oxygen. Objective Constitutional Sitting or standing Blood Pressure is within target range for patient.. Pulse regular and within target range for patient.[name] Respirations regular, non-labored and within target range.. Temperature is normal  and within the target range for the patient.[name] Appears in no distress. Vitals Time Taken: 3:35 PM, Height: 65 in, Source: Stated, Weight: 201 lbs, Source: Stated, BMI: 33.4, Temperature: 98.5 F, Pulse: 70 bpm, Respiratory Rate: 18 breaths/min, Blood Pressure: 106/73 mmHg. General Notes: Wound exam; the patient has a really nice clean wound. There is no exposed bone healthy granulation tissue. I do not know that it this is coming in depth too much but certainly the other wound measurements are better. Granulation looks very healthy there is no purulent drainage. No evidence of surrounding infection. Integumentary (Hair, Skin) Wound #1 status is Open. Original cause of wound was Pressure Injury. The wound is located on the Medial Sacrum. The wound measures 2.1cm length x 2cm width x 2.5cm depth; 3.299cm^2 area and 8.247cm^3 volume. There is Fat Layer (Subcutaneous Tissue) exposed. There is no tunneling or undermining noted. There is a medium amount of serosanguineous drainage noted. The wound margin is well defined and not attached to the wound base. There is large (67-100%) red, pink granulation within the wound bed. There is no necrotic tissue within the wound bed. Assessment Active Problems ICD-10 Pressure ulcer of sacral region, stage 4 Paraplegia,  incomplete Plan Follow-up Appointments: Return appointment in 1 month. Bathing/ Shower/ Hygiene: May shower with protection but do not get wound dressing(s) wet. Off-Loading: Turn and reposition every 2 hours WOUND #1: - Sacrum Wound Laterality: Medial Cleanser: Wound Cleanser (DME) (Generic) 1 x Per Day/30 Days Discharge Instructions: Cleanse the wound with wound cleanser prior to applying a clean dressing using gauze sponges, not tissue or cotton balls. Prim Dressing: Curity Gauze Sponge Non-Sterile, 4x4 (in/in) (DME) (Generic) 1 x Per Day/30 Days ary Discharge Instructions: anasept moistened gauze packing Secondary Dressing: ABD Pad, 5x9 (DME) 1 x Per Day/30 Days Discharge Instructions: Apply over primary dressing as directed. Secured With: 61M Medipore H Soft Cloth Surgical T 4 x 2 (in/yd) (DME) (Generic) 1 x Per Day/30 Days ape Discharge Instructions: Secure dressing with tape as directed. 1. Continue with the Anasept wet-to-dry 2. The improvement in the wound is indeed fortunate, she does not have insurance for standard wound care dressings. 3. She is offloading this aggressively and I talked with her about this. Electronic Signature(s) Signed: 03/27/2020 5:14:25 PM By: Linton Ham MD Entered By: Linton Ham on 03/27/2020 17:13:29 -------------------------------------------------------------------------------- SuperBill Details Patient Name: Date of Service: [name] 03/27/2020 Medical Record Number: [id number] Patient Account Number: [id number] Date of Birth/Sex: Treating RN: 01-Oct-1980 (40 y.o. [name], [name] Primary Care Provider: [name] Other Clinician: Referring Provider: Treating Provider/Extender: Mare Ferrari in Treatment: 195 Diagnosis Coding ICD-10 Codes Code Description L89.154 Pressure ulcer of sacral region, stage 4 G82.22 Paraplegia, incomplete Facility Procedures CPT4 Code: [id number] Description:  99213 - WOUND CARE VISIT-LEV 3 EST PT Modifier: Quantity: 1 Physician Procedures : CPT4 Code Description Modifier [id number] - WC PHYS LEVEL 3 - EST PT ICD-10 Diagnosis Description L89.154 Pressure ulcer of sacral region, stage 4 G82.22 Paraplegia, incomplete Quantity: 1 Electronic Signature(s) Signed: 03/27/2020 5:14:25 PM By: Linton Ham MD Entered By: Linton Ham on 03/27/2020 17:13:46

## 2020-03-30 ENCOUNTER — Ambulatory Visit: Payer: Medicaid Other | Admitting: Family Medicine

## 2020-03-30 ENCOUNTER — Encounter: Payer: Self-pay | Admitting: Family Medicine

## 2020-03-30 ENCOUNTER — Other Ambulatory Visit: Payer: Self-pay

## 2020-03-30 VITALS — BP 164/104 | HR 74 | Temp 98.9°F

## 2020-03-30 DIAGNOSIS — F411 Generalized anxiety disorder: Secondary | ICD-10-CM

## 2020-03-30 DIAGNOSIS — Z23 Encounter for immunization: Secondary | ICD-10-CM | POA: Diagnosis not present

## 2020-03-30 DIAGNOSIS — F339 Major depressive disorder, recurrent, unspecified: Secondary | ICD-10-CM

## 2020-03-30 DIAGNOSIS — I10 Essential (primary) hypertension: Secondary | ICD-10-CM | POA: Diagnosis not present

## 2020-03-30 DIAGNOSIS — Z79899 Other long term (current) drug therapy: Secondary | ICD-10-CM

## 2020-03-30 DIAGNOSIS — G8929 Other chronic pain: Secondary | ICD-10-CM | POA: Diagnosis not present

## 2020-03-30 DIAGNOSIS — N3945 Continuous leakage: Secondary | ICD-10-CM

## 2020-03-30 DIAGNOSIS — E038 Other specified hypothyroidism: Secondary | ICD-10-CM

## 2020-03-30 DIAGNOSIS — Z9981 Dependence on supplemental oxygen: Secondary | ICD-10-CM

## 2020-03-30 DIAGNOSIS — Z8701 Personal history of pneumonia (recurrent): Secondary | ICD-10-CM

## 2020-03-30 DIAGNOSIS — R232 Flushing: Secondary | ICD-10-CM

## 2020-03-30 MED ORDER — OXYCODONE HCL 10 MG PO TABS: 10.0000 mg | ORAL_TABLET | Freq: Three times a day (TID) | ORAL | 0 refills | Status: DC

## 2020-03-30 MED ORDER — LISINOPRIL 10 MG PO TABS: 10.0000 mg | ORAL_TABLET | Freq: Every day | ORAL | 2 refills | Status: DC
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
## 2020-03-30 NOTE — Patient Instructions (Signed)
Please get [facility] to either fax [place] or send you the forms/required documentation for the power wheelchair.  Call the urologist regarding the [place].  Black Cohosh or [place] for hot flashes.   Monitor BP at home and keep a log to bring with you to your next appointment.    DASH Eating Plan DASH stands for Dietary Approaches to Stop Hypertension. The DASH eating plan is a healthy eating plan that has been shown to:  Reduce high blood pressure (hypertension).  Reduce your risk for type 2 diabetes, heart disease, and stroke.  Help with weight loss. What are tips for following this plan? Reading food labels  Check food labels for the amount of salt (sodium) per serving. Choose foods with less than 5 percent of the Daily Value of sodium. Generally, foods with less than 300 milligrams (mg) of sodium per serving fit into this eating plan.  To find whole grains, look for the word "whole" as the first word in the ingredient list. Shopping  Buy products labeled as "low-sodium" or "no salt added."  Buy fresh foods. Avoid canned foods and pre-made or frozen meals. Cooking  Avoid adding salt when cooking. Use salt-free seasonings or herbs instead of table salt or sea salt. Check with your health care provider or pharmacist before using salt substitutes.  Do not fry foods. Cook foods using healthy methods such as baking, boiling, grilling, roasting, and broiling instead.  Cook with heart-healthy oils, such as olive, canola, avocado, soybean, or sunflower oil. Meal planning  Eat a balanced diet that includes: ? 4 or more servings of fruits and 4 or more servings of vegetables each day. Try to fill one-half of your plate with fruits and vegetables. ? 6-8 servings of whole grains each day. ? Less than 6 oz (170 g) of lean meat, poultry, or fish each day. A 3-oz (85-g) serving of meat is about the same size as a deck of cards. One egg equals 1 oz (28 g). ? 2-3 servings of low-fat dairy  each day. One serving is 1 cup (237 mL). ? 1 serving of nuts, seeds, or beans 5 times each week. ? 2-3 servings of heart-healthy fats. Healthy fats called omega-3 fatty acids are found in foods such as walnuts, flaxseeds, fortified milks, and eggs. These fats are also found in cold-water fish, such as sardines, salmon, and mackerel.  Limit how much you eat of: ? Canned or prepackaged foods. ? Food that is high in trans fat, such as some fried foods. ? Food that is high in saturated fat, such as fatty meat. ? Desserts and other sweets, sugary drinks, and other foods with added sugar. ? Full-fat dairy products.  Do not salt foods before eating.  Do not eat more than 4 egg yolks a week.  Try to eat at least 2 vegetarian meals a week.  Eat more home-cooked food and less restaurant, buffet, and fast food.   Lifestyle  When eating at a restaurant, ask that your food be prepared with less salt or no salt, if possible.  If you drink alcohol: ? Limit how much you use to:  0-1 drink a day for women who are not pregnant.  0-2 drinks a day for men. ? Be aware of how much alcohol is in your drink. In the [place] one drink equals one 12 oz bottle of beer (355 mL), one 5 oz glass of wine (148 mL), or one 1 oz glass of hard liquor (44 mL). General information  Avoid eating more than 2,300 mg of salt a day. If you have hypertension, you may need to reduce your sodium intake to 1,500 mg a day.  Work with your health care provider to maintain a healthy body weight or to lose weight. Ask what an ideal weight is for you.  Get at least 30 minutes of exercise that causes your heart to beat faster (aerobic exercise) most days of the week. Activities may include walking, swimming, or biking.  Work with your health care provider or dietitian to adjust your eating plan to your individual calorie needs. What foods should I eat? Fruits All fresh, dried, or frozen fruit. Canned fruit in natural juice  (without added sugar). Vegetables Fresh or frozen vegetables (raw, steamed, roasted, or grilled). Low-sodium or reduced-sodium tomato and vegetable juice. Low-sodium or reduced-sodium tomato sauce and tomato paste. Low-sodium or reduced-sodium canned vegetables. Grains Whole-grain or whole-wheat bread. Whole-grain or whole-wheat pasta. Brown rice. [place]. Bulgur. Whole-grain and low-sodium cereals. Pita bread. Low-fat, low-sodium crackers. Whole-wheat flour tortillas. Meats and other proteins Skinless chicken or [place]. Ground chicken or [place]. Pork with fat trimmed off. Fish and seafood. Egg whites. Dried beans, peas, or lentils. Unsalted nuts, nut butters, and seeds. Unsalted canned beans. Lean cuts of beef with fat trimmed off. Low-sodium, lean precooked or cured meat, such as sausages or meat loaves. Dairy Low-fat (1%) or fat-free (skim) milk. Reduced-fat, low-fat, or fat-free cheeses. Nonfat, low-sodium ricotta or cottage cheese. Low-fat or nonfat yogurt. Low-fat, low-sodium cheese. Fats and oils Soft margarine without trans fats. Vegetable oil. Reduced-fat, low-fat, or light mayonnaise and salad dressings (reduced-sodium). Canola, safflower, olive, avocado, soybean, and sunflower oils. Avocado. Seasonings and condiments Herbs. Spices. Seasoning mixes without salt. Other foods Unsalted popcorn and pretzels. Fat-free sweets. The items listed above may not be a complete list of foods and beverages you can eat. Contact a dietitian for more information. What foods should I avoid? Fruits Canned fruit in a light or heavy syrup. Fried fruit. Fruit in cream or butter sauce. Vegetables Creamed or fried vegetables. Vegetables in a cheese sauce. Regular canned vegetables (not low-sodium or reduced-sodium). Regular canned tomato sauce and paste (not low-sodium or reduced-sodium). Regular tomato and vegetable juice (not low-sodium or reduced-sodium). Angie Fava. Olives. Grains Baked goods made  with fat, such as croissants, muffins, or some breads. Dry pasta or rice meal packs. Meats and other proteins Fatty cuts of meat. Ribs. Fried meat. Berniece Salines. Bologna, salami, and other precooked or cured meats, such as sausages or meat loaves. Fat from the back of a pig (fatback). Bratwurst. Salted nuts and seeds. Canned beans with added salt. Canned or smoked fish. Whole eggs or egg yolks. Chicken or [place] with skin. Dairy Whole or 2% milk, cream, and half-and-half. Whole or full-fat cream cheese. Whole-fat or sweetened yogurt. Full-fat cheese. Nondairy creamers. Whipped toppings. Processed cheese and cheese spreads. Fats and oils Butter. Stick margarine. Lard. Shortening. Ghee. Bacon fat. Tropical oils, such as coconut, palm kernel, or palm oil. Seasonings and condiments Onion salt, garlic salt, seasoned salt, table salt, and sea salt. Worcestershire sauce. Tartar sauce. Barbecue sauce. Teriyaki sauce. Soy sauce, including reduced-sodium. Steak sauce. Canned and packaged gravies. Fish sauce. Oyster sauce. Cocktail sauce. Store-bought horseradish. Ketchup. Mustard. Meat flavorings and tenderizers. Bouillon cubes. Hot sauces. Pre-made or packaged marinades. Pre-made or packaged taco seasonings. Relishes. Regular salad dressings. Other foods Salted popcorn and pretzels. The items listed above may not be a complete list of foods and beverages you should avoid. Contact  a dietitian for more information. Where to find more information  National Heart, Lung, and Blood Institute: https://wilson-eaton.com/  American Heart Association: www.heart.org  Academy of Nutrition and Dietetics: www.eatright.[place]: www.kidney.org Summary  The DASH eating plan is a healthy eating plan that has been shown to reduce high blood pressure (hypertension). It may also reduce your risk for type 2 diabetes, heart disease, and stroke.  When on the DASH eating plan, aim to eat more fresh fruits and  vegetables, whole grains, lean proteins, low-fat dairy, and heart-healthy fats.  With the DASH eating plan, you should limit salt (sodium) intake to 2,300 mg a day. If you have hypertension, you may need to reduce your sodium intake to 1,500 mg a day.  Work with your health care provider or dietitian to adjust your eating plan to your individual calorie needs. This information is not intended to replace advice given to you by your health care provider. Make sure you discuss any questions you have with your health care provider. Document Revised: 01/21/2019 Document Reviewed: 01/21/2019 Elsevier Patient Education  2021 [employer].

## 2020-04-02 ENCOUNTER — Telehealth: Payer: Self-pay

## 2020-04-02 ENCOUNTER — Encounter: Payer: Self-pay | Admitting: Family Medicine

## 2020-04-02 DIAGNOSIS — R232 Flushing: Secondary | ICD-10-CM | POA: Insufficient documentation

## 2020-04-02 DIAGNOSIS — I10 Essential (primary) hypertension: Secondary | ICD-10-CM | POA: Insufficient documentation

## 2020-04-02 DIAGNOSIS — Z9981 Dependence on supplemental oxygen: Secondary | ICD-10-CM | POA: Insufficient documentation

## 2020-04-02 NOTE — Telephone Encounter (Signed)
Please call [name] and have them fax [place] their required documentation. Patient is going to make an appointment specifically for this.

## 2020-04-02 NOTE — Telephone Encounter (Signed)
Pts mother called stating that she spoke with someone from [facility] and was told that they need pt's PCP ([name]) to send them the order for pt to get a power wheelchair so that they can get the paperwork started for it.   [facility] contact info: [phone] (phone #) [phone] (fax #)

## 2020-04-03 ENCOUNTER — Ambulatory Visit: Payer: Medicaid Other | Admitting: Family Medicine

## 2020-04-03 NOTE — Telephone Encounter (Signed)
Attempted to contact [facility] - left message for them to call back   Provider would like them to fax [place] the paper work and documentation needed for the power wheelchair  [name]- patient was seen 03/30/20- will she need another visit for this ?

## 2020-04-03 NOTE — Telephone Encounter (Signed)
Patient does need a visit specifically for power wheelchair documentation

## 2020-04-03 NOTE — Telephone Encounter (Signed)
Provider aware [facility] does not have paperwork that they will send to [place]. Appointment scheduled for 02/11 @ 3:50pm with [name], [name].

## 2020-04-03 NOTE — Telephone Encounter (Signed)
Please call mother back Arville Go help explain to her the reason for the apt. I already told her she needs an apt specifically for documentation and she says no she does not.

## 2020-04-03 NOTE — Telephone Encounter (Signed)
Left message to call back to schedule appointment 

## 2020-04-03 NOTE — Telephone Encounter (Signed)
Tried to explain to mom that patient needs an appointment specifically just to discuss the need for this equipment only.  Mother insists that they were told by you that there is no need for an appointment until you  gets the paperwork from [place] and she refuses to make an appointment.  Please advise.

## 2020-04-03 NOTE — Telephone Encounter (Signed)
I either want [facility] to fax me the paperwork before the appointment or I want the patient to bring it with her the day of the appointment. The documentation is so specific I want to be able to follow their checklist the day of the appointment so nothing gets left out.

## 2020-04-13 ENCOUNTER — Ambulatory Visit: Payer: Medicaid Other | Admitting: Family Medicine

## 2020-04-13 ENCOUNTER — Other Ambulatory Visit: Payer: Self-pay

## 2020-04-13 ENCOUNTER — Encounter: Payer: Self-pay | Admitting: Family Medicine

## 2020-04-13 VITALS — BP 124/82 | HR 70 | Temp 98.4°F | Ht 66.0 in | Wt 335.0 lb

## 2020-04-13 DIAGNOSIS — T1490XS Injury, unspecified, sequela: Secondary | ICD-10-CM

## 2020-04-13 DIAGNOSIS — M25512 Pain in left shoulder: Secondary | ICD-10-CM

## 2020-04-13 DIAGNOSIS — T8149XA Infection following a procedure, other surgical site, initial encounter: Secondary | ICD-10-CM

## 2020-04-13 DIAGNOSIS — M62838 Other muscle spasm: Secondary | ICD-10-CM | POA: Diagnosis not present

## 2020-04-13 DIAGNOSIS — G8929 Other chronic pain: Secondary | ICD-10-CM | POA: Diagnosis not present

## 2020-04-13 DIAGNOSIS — S34101D Unspecified injury to L1 level of lumbar spinal cord, subsequent encounter: Secondary | ICD-10-CM

## 2020-04-13 DIAGNOSIS — N368 Other specified disorders of urethra: Secondary | ICD-10-CM

## 2020-04-13 DIAGNOSIS — T8389XD Other specified complication of genitourinary prosthetic devices, implants and grafts, subsequent encounter: Secondary | ICD-10-CM

## 2020-04-13 DIAGNOSIS — L8945 Pressure ulcer of contiguous site of back, buttock and hip, unstageable: Secondary | ICD-10-CM
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
## 2020-04-16 ENCOUNTER — Encounter: Payer: Self-pay | Admitting: Family Medicine

## 2020-04-18 ENCOUNTER — Encounter (HOSPITAL_COMMUNITY): Payer: Self-pay | Admitting: Emergency Medicine

## 2020-04-18 ENCOUNTER — Emergency Department (HOSPITAL_COMMUNITY): Payer: Medicaid Other

## 2020-04-18 ENCOUNTER — Other Ambulatory Visit: Payer: Self-pay

## 2020-04-18 ENCOUNTER — Emergency Department (HOSPITAL_COMMUNITY)
Admission: EM | Admit: 2020-04-18 | Discharge: 2020-04-18 | Disposition: A | Discharge status: Home / Self Care | Payer: Medicaid Other | Attending: Emergency Medicine | Admitting: Emergency Medicine

## 2020-04-18 DIAGNOSIS — E039 Hypothyroidism, unspecified: Secondary | ICD-10-CM | POA: Diagnosis not present

## 2020-04-18 DIAGNOSIS — R059 Cough, unspecified: Secondary | ICD-10-CM | POA: Diagnosis not present

## 2020-04-18 DIAGNOSIS — I10 Essential (primary) hypertension: Secondary | ICD-10-CM | POA: Diagnosis not present

## 2020-04-18 DIAGNOSIS — Z79899 Other long term (current) drug therapy: Secondary | ICD-10-CM | POA: Diagnosis not present

## 2020-04-18 DIAGNOSIS — R0602 Shortness of breath: Secondary | ICD-10-CM | POA: Insufficient documentation

## 2020-04-18 DIAGNOSIS — R079 Chest pain, unspecified: Secondary | ICD-10-CM | POA: Diagnosis present

## 2020-04-18 DIAGNOSIS — R110 Nausea: Secondary | ICD-10-CM | POA: Diagnosis not present

## 2020-04-18 DIAGNOSIS — Z87891 Personal history of nicotine dependence: Secondary | ICD-10-CM | POA: Insufficient documentation

## 2020-04-18 DIAGNOSIS — R0789 Other chest pain: Secondary | ICD-10-CM | POA: Insufficient documentation

## 2020-04-18 LAB — CBC WITH DIFFERENTIAL/PLATELET
Abs Immature Granulocytes: 0.05 10*3/uL (ref 0.00–0.07)
Basophils Absolute: 0 10*3/uL (ref 0.0–0.1)
Basophils Relative: 0 %
Eosinophils Absolute: 0.2 10*3/uL (ref 0.0–0.5)
Eosinophils Relative: 2 %
HCT: 38.1 % (ref 36.0–46.0)
Hemoglobin: 11.7 g/dL — ABNORMAL LOW (ref 12.0–15.0)
Immature Granulocytes: 0 %
Lymphocytes Relative: 22 %
Lymphs Abs: 2.6 10*3/uL (ref 0.7–4.0)
MCH: 29.3 pg (ref 26.0–34.0)
MCHC: 30.7 g/dL (ref 30.0–36.0)
MCV: 95.3 fL (ref 80.0–100.0)
Monocytes Absolute: 0.9 10*3/uL (ref 0.1–1.0)
Monocytes Relative: 7 %
Neutro Abs: 8.3 10*3/uL — ABNORMAL HIGH (ref 1.7–7.7)
Neutrophils Relative %: 69 %
Platelets: 386 10*3/uL (ref 150–400)
RBC: 4 MIL/uL (ref 3.87–5.11)
RDW: 13.9 % (ref 11.5–15.5)
WBC: 12.1 10*3/uL — ABNORMAL HIGH (ref 4.0–10.5)
nRBC: 0 % (ref 0.0–0.2)

## 2020-04-18 LAB — BASIC METABOLIC PANEL
Anion gap: 9 (ref 5–15)
BUN: 14 mg/dL (ref 6–20)
CO2: 29 mmol/L (ref 22–32)
Calcium: 8.9 mg/dL (ref 8.9–10.3)
Chloride: 98 mmol/L (ref 98–111)
Creatinine, Ser: 0.83 mg/dL (ref 0.44–1.00)
GFR, Estimated: >60 mL/min (ref >60)
Glucose, Bld: 95 mg/dL (ref 70–99)
Potassium: 3.5 mmol/L (ref 3.5–5.1)
Sodium: 136 mmol/L (ref 135–145)

## 2020-04-18 LAB — BRAIN NATRIURETIC PEPTIDE: B Natriuretic Peptide: 27 pg/mL (ref 0.0–100.0)

## 2020-04-18 LAB — TROPONIN I (HIGH SENSITIVITY)
Troponin I (High Sensitivity): <2 ng/L (ref <18)
Troponin I (High Sensitivity): 2 ng/L (ref <18)

## 2020-04-18 MED ORDER — ONDANSETRON HCL 4 MG/2ML IJ SOLN
4.0000 mg | Freq: Once | INTRAMUSCULAR | Status: AC
  Administered 2020-04-18: 4 mg via INTRAVENOUS
  Filled 2020-04-18: qty 2

## 2020-04-18 MED ORDER — SODIUM CHLORIDE 0.9 % IV BOLUS
500.0000 mL | Freq: Once | INTRAVENOUS | Status: AC
  Administered 2020-04-18: 500 mL via INTRAVENOUS

## 2020-04-18 MED ORDER — OXYCODONE HCL 5 MG PO TABS
10.0000 mg | ORAL_TABLET | Freq: Once | ORAL | Status: AC
  Administered 2020-04-18: 10 mg via ORAL
  Filled 2020-04-18: qty 2

## 2020-04-18 MED ORDER — KETOROLAC TROMETHAMINE 30 MG/ML IJ SOLN
30.0000 mg | Freq: Once | INTRAMUSCULAR | Status: AC
  Administered 2020-04-18: 30 mg via INTRAVENOUS
  Filled 2020-04-18: qty 1

## 2020-04-18 MED ORDER — IOHEXOL 350 MG/ML SOLN
100.0000 mL | Freq: Once | INTRAVENOUS | Status: AC | PRN
  Administered 2020-04-18: 100 mL via INTRAVENOUS

## 2020-04-18 MED ORDER — METOCLOPRAMIDE HCL 5 MG/ML IJ SOLN
10.0000 mg | Freq: Once | INTRAMUSCULAR | Status: AC
  Administered 2020-04-18: 10 mg via INTRAVENOUS
  Filled 2020-04-18: qty 2

## 2020-04-18 MED ORDER — OXYCODONE HCL 5 MG PO TABS: 20.0000 mg | ORAL_TABLET | Freq: Once | ORAL | Status: DC

## 2020-04-18 NOTE — ED Triage Notes (Signed)
Pt with c/o chest pain x 2 weeks and new shortness of breath. Pt also c/o nausea.

## 2020-04-18 NOTE — ED Notes (Signed)
Pt given lunch tray continues to wait for ride home.
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
## 2020-04-18 NOTE — ED Notes (Signed)
Pt discharged awaiting EMS for ride home.

## 2020-04-18 NOTE — ED Notes (Signed)
Pt continues to wait for ride home

## 2020-04-19 ENCOUNTER — Telehealth: Payer: Self-pay

## 2020-04-19 DIAGNOSIS — I10 Essential (primary) hypertension: Secondary | ICD-10-CM

## 2020-04-19 MED ORDER — LOSARTAN POTASSIUM 50 MG PO TABS: 50.0000 mg | ORAL_TABLET | Freq: Every day | ORAL | 2 refills | Status: DC

## 2020-04-19 NOTE — Telephone Encounter (Signed)
Lisinopril discontinued.  Rx'd losartan.

## 2020-04-19 NOTE — Telephone Encounter (Signed)
Side effect from medication - please advise

## 2020-04-19 NOTE — Telephone Encounter (Signed)
Patient aware and verbalized understanding. °

## 2020-04-19 NOTE — Telephone Encounter (Signed)
Transition Care Management Follow-up Telephone Call  Date of discharge and from where: 04/18/2020 from [facility]  How have you been since you were released from the hospital? Pt stated that she is feeling much better today. Pt stated that she spoke to her PCP office today and they are not able to rx benzo for anxiety disorder because is currently being treated with pain medication.   Any questions or concerns? No  Items Reviewed:  Did the pt receive and understand the discharge instructions provided? Yes   Medications obtained and verified? Yes   Other? No   Any new allergies since your discharge? No   Dietary orders reviewed? N/A  Do you have support at home? Yes   Functional Questionnaire: (I = Independent and D = Dependent) ADLs: D - spinal cord injury.   Bathing/Dressing- D  Meal Prep- D  Eating- I  Maintaining continence- D  Transferring/Ambulation- D  Managing Meds- I   Follow up appointments reviewed:   [facility] f/u appt confirmed? Yes  Scheduled to see Glenetta Hew on 05/02/2020 @ 03:45pm.  Are transportation arrangements needed? No  If their condition worsens, is the pt aware to call PCP or go to the Emergency Dept.? Yes Was the patient provided with contact information for the PCP's office or ED? Yes Was to pt encouraged to call back with questions or concerns? Yes

## 2020-04-20 ENCOUNTER — Other Ambulatory Visit: Payer: Self-pay | Admitting: Family Medicine

## 2020-04-20 DIAGNOSIS — I10 Essential (primary) hypertension: Secondary | ICD-10-CM

## 2020-04-20 DIAGNOSIS — M62838 Other muscle spasm: Secondary | ICD-10-CM

## 2020-04-23 ENCOUNTER — Other Ambulatory Visit (HOSPITAL_COMMUNITY): Payer: Self-pay | Admitting: Student

## 2020-04-23 DIAGNOSIS — M5412 Radiculopathy, cervical region: Secondary | ICD-10-CM

## 2020-04-24 ENCOUNTER — Encounter (HOSPITAL_BASED_OUTPATIENT_CLINIC_OR_DEPARTMENT_OTHER): Payer: Medicaid Other | Attending: Internal Medicine | Admitting: Internal Medicine

## 2020-04-24 ENCOUNTER — Other Ambulatory Visit: Payer: Self-pay

## 2020-04-24 DIAGNOSIS — L89154 Pressure ulcer of sacral region, stage 4: Secondary | ICD-10-CM | POA: Diagnosis present

## 2020-04-24 DIAGNOSIS — G8222 Paraplegia, incomplete: Secondary | ICD-10-CM | POA: Insufficient documentation

## 2020-04-24 NOTE — Progress Notes (Signed)
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 medial sacral region. 2/5; I have not seen this wound in almost 3 months. Certainly has gotten better in terms of surface area although there is significant direct depth. She has completed antibiotics for the  underlying osteomyelitis. 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 She has been using silver alginate. Her mother is changing the dressing. She claims to be offloading this area except for when she gets up to go to doctors appointments 3/12; this is a punched-out wound on the lower sacrum. Has a depth of about 1.8 cm. It does not appear to undermine. There is no palpable bone. We have been using silver alginate packing her mother is changing the dressing she claims to be offloading this. She had a baclofen pump placed to alleviate her spasticity 5/17; the patient has not been seen in over 2 months. 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 Apparently they started to notice an odor and drainage earlier this month. Patient was admitted to [facility] from 5/3 through 07/12/2019. We do not have any records here. Apparently she was found to have pneumonia, a UTI. She also went underwent a surgical debridement of her lower sacral wound. She comes in today with a really large postoperative wound. This does not have exposed bone but  very close. This is right back to where this was a year or 2 ago. They have been using wet-to-dry. She is on an IV antibiotic but is not sure what drugs. We are not sure what home health company they are currently using. We are trying to get records from [facility]. 6/8; this the patient return to clinic 3 weeks ago. She had surgical debridement of the lower sacral wound. She apparently is completed her IV antibiotics although I am not exactly sure what IV antibiotics she had ordered. Her PICC line is come out. Her wound is large but generally clean there still is exposed bone. ooFortunately the area on the right heel is callused over I cannot prove there is an open area here per 6/22; they are using a wet to dry dressing when we left the clinic last time however  they managed to find a box of silver alginate so they are using that with backing gauze. They are still changing this twice a day because of drainage issues. She has Medicaid and they will not be able to replace the want dressing she will have to go back to a wet-to-dry. In spite of this the wound actually looks quite good some improvement in dimension 7/13; using silver alginate. Slight improvement in overall wound volume including depth although there is still undermining. She tells me she is offloading this is much as possible 8/10-Patient back at 1 month, continuing to use silver alginate although she has run out and therefore using saline wet-to-dry, undermining is minimal, continuing attempts at offloading according to patient 9/21; its been about 9 weeks since I have seen this patient although I note she was seen once in August. Her wounds on the lower sacrum this looks a lot better than the last time I saw this. She is using silver alginate to the wound bed. They only have Medicaid therefore they have to need to pay out-of-pocket for the dressing supplies. This certainly limits options. 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 10/19; 1 month follow-up. Not much change in the wound in fact it is measuring slightly larger. Still using silver alginate which her mother is purchasing off [employer] I believe. Since she was here she had a suprapubic catheter placed because of a lacerated urethra. 11/30; patient has not been here in about 6 weeks. She apparently has had 3 admissions to the hospital for pneumonia in the last 7 months 2 in the last 2 months. She came out of hospital this time with 4 L of oxygen. Her mother is using the Anasept wet-to-dry to the sacral wound and surprisingly this looks somewhat better. The patient states she is pending 24/7 in bed except for going to doctor's appointments this may be helping. She  has an appointment with pulmonology on 12/17. She was a heavy smoker for a period of time I wonder whether she has COPD 12/28; patient is doing well she is off her oxygen which may have something to do with chronic Macrobid she was on [hypersensitivity pneumonitis]. She is also stop smoking. In any case she is doing well from a breathing point of view. She is using Anasept wet-to-dry. 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 She has been using Anasept gel wet-to-dry and really has done a nice job here. She does not have insurance for other wound care products  but truthfully so far this is done a really good job. I note she is back on oxygen. 2/22; stage IV wound on her lower sacrum. They are no longer able to get Anasept gel not really clear what they have been using. She does not have insurance to cover wound care products otherwise I might like to try a collagen-based dressing wet to dry. But for now we will simply do a basic normal saline wet-to-dry. Apparently her mother cannot make Dakin's Objective Constitutional Sitting or standing Blood Pressure is within target range for patient.. Pulse regular and within target range for patient.[name] Respirations regular, non-labored and within target range.. Temperature is normal and within the target range for the patient.[name] Appears in no distress. Vitals Time Taken: 1:43 PM, Height: 65 in, Weight: 201 lbs, BMI: 33.4, Temperature: 98.5 F, Pulse: 88 bpm, Respiratory Rate: 18 breaths/min, Blood Pressure: 136/85 mmHg. General Notes: Wound exam; the patient has a nice clean wound. There is no palpable bone. Granulation looks very healthy no undermining no evidence of surrounding infection Integumentary (Hair, Skin) Wound #1 status is Open. Original cause of wound was Pressure Injury. The date acquired was: 05/15/2016. The wound has been in treatment 199 weeks. The wound is located on the  Medial Sacrum. The wound measures 1.5cm length x 1.5cm width x 2.6cm depth; 1.767cm^2 area and 4.595cm^3 volume. There is Fat Layer (Subcutaneous Tissue) exposed. There is no tunneling noted, however, there is undermining starting at 12:00 and ending at 3:00 with a maximum distance of 1.9cm. There is a medium amount of serosanguineous drainage noted. The wound margin is well defined and not attached to the wound base. There is large (67-100%) pink granulation within the wound bed. There is no necrotic tissue within the wound bed. Assessment Active Problems ICD-10 Pressure ulcer of sacral region, stage 4 Paraplegia, incomplete Plan Follow-up Appointments: Return appointment in 1 month. Bathing/ Shower/ Hygiene: May shower with protection but do not get wound dressing(s) wet. Off-Loading: Turn and reposition every 2 hours WOUND #1: - Sacrum Wound Laterality: Medial Cleanser: Wound Cleanser 1 x Per Day/30 Days Discharge Instructions: Cleanse the wound with wound cleanser prior to applying a clean dressing using gauze sponges, not tissue or cotton balls. Cleanser: Wound Cleanser (Generic) 1 x Per Day/30 Days Discharge Instructions: Cleanse the wound with wound cleanser prior to applying a clean dressing using gauze sponges, not tissue or cotton balls. Prim Dressing: Curity Gauze Sponge Non-Sterile, 4x4 (in/in) (Generic) 1 x Per Day/30 Days ary Discharge Instructions: anasept moistened gauze packing Secondary Dressing: ABD Pad, 5x9 1 x Per Day/30 Days Discharge Instructions: Apply over primary dressing as directed. Secured With: 1M Medipore H Soft Cloth Surgical T 4 x 2 (in/yd) (Generic) 1 x Per Day/30 Days ape Discharge Instructions: Secure dressing with tape as directed. 1. We are going to continue with the Anasept wet-to-dry which she apparently buys privately. 2. There is no evidence of infection she does not have palpable bone Electronic Signature(s) Signed: 04/24/2020 5:22:30 PM By:  Linton Ham MD Entered By: Linton Ham on 04/24/2020 14:20:33 -------------------------------------------------------------------------------- SuperBill Details Patient Name: Date of Service: Jamie Boyden A. 04/24/2020 Medical Record Number: [id number] Patient Account Number: [id number] Date of Birth/Sex: Treating RN: 1980/08/09 (40 y.o. [name], [name] Primary Care Provider: [name] Other Clinician: Referring Provider: Treating Provider/Extender: Mare Ferrari in Treatment: 199 Diagnosis Coding ICD-10 Codes Code Description L89.154 Pressure ulcer of sacral region, stage 4 G82.22 Paraplegia, incomplete Facility Procedures CPT4 Code: [id number] Description:  00174 - WOUND CARE VISIT-LEV 3 EST PT Modifier: Quantity: 1 Physician Procedures : CPT4 Code Description Modifier [id number] - WC PHYS LEVEL 3 - EST PT ICD-10 Diagnosis Description L89.154 Pressure ulcer of sacral region, stage 4 G82.22 Paraplegia, incomplete Quantity: 1 Electronic Signature(s) Signed: 04/24/2020 5:22:30 PM By: Linton Ham MD Entered By: Linton Ham on 04/24/2020 14:22:14

## 2020-04-25 NOTE — Progress Notes (Signed)
[name], [name] ([id number]) Visit Report for 04/24/2020 Arrival Information Details Patient Name: Date of Service: [name], [name] 04/24/2020 1:15 PM Medical Record Number: [id number] Patient Account Number: [id number] Date of Birth/Sex: Treating RN: [birth year] (40 y.o. [name], [name] Primary Care Provider: Hendricks [name] Other Clinician: Referring Provider: Treating Provider/Extender: [name] in Treatment: 199 Visit Information History Since Last Visit Added or deleted any medications: No Patient Arrived: Wheel Chair Any new allergies or adverse reactions: No Arrival Time: 13:42 Had a fall or experienced change in No Accompanied By: caregiver activities of daily living that may affect Transfer Assistance: None risk of falls: Patient Identification Verified: Yes Signs or symptoms of abuse/neglect since last visito No Secondary Verification Process Completed: Yes Hospitalized since last visit: No Patient Requires Transmission-Based Precautions: No Implantable device outside of the clinic excluding No Patient Has Alerts: No cellular tissue based products placed in the center since last visit: Has Dressing in Place as Prescribed: Yes Pain Present Now: No Electronic Signature(s) Signed: 04/25/2020 10:45:54 AM By: [name] Entered By: [name] on 04/24/2020 13:43:30 -------------------------------------------------------------------------------- Clinic Level of Care Assessment Details Patient Name: Date of Service: [name] 04/24/2020 1:15 PM Medical Record Number: [id number] Patient Account Number: [id number] Date of Birth/Sex: Treating RN: 26-Oct-1980 (40 y.o. [name], [name] Primary Care Provider: Hendricks [name] Other Clinician: Referring Provider: Treating Provider/Extender: [name] in Treatment: 199 Clinic Level of Care Assessment Items TOOL 4 Quantity Score X- 1  0 Use when only an EandM is performed on FOLLOW-UP visit ASSESSMENTS - Nursing Assessment / Reassessment X- 1 10 Reassessment of Co-morbidities (includes updates in patient status) X- 1 5 Reassessment of Adherence to Treatment Plan ASSESSMENTS - Wound and Skin A ssessment / Reassessment X - Simple Wound Assessment / Reassessment - one wound 1 5 '[]'  - 0 Complex Wound Assessment / Reassessment - multiple wounds X- 1 10 Dermatologic / Skin Assessment (not related to wound area) ASSESSMENTS - Focused Assessment '[]'  - 0 Circumferential Edema Measurements - multi extremities X- 1 10 Nutritional Assessment / Counseling / Intervention X- 1 5 Lower Extremity Assessment (monofilament, tuning fork, pulses) '[]'  - 0 Peripheral Arterial Disease Assessment (using hand held doppler) ASSESSMENTS - Ostomy and/or Continence Assessment and Care '[]'  - 0 Incontinence Assessment and Management '[]'  - 0 Ostomy Care Assessment and Management (repouching, etc.) PROCESS - Coordination of Care X - Simple Patient / Family Education for ongoing care 1 15 '[]'  - 0 Complex (extensive) Patient / Family Education for ongoing care X- 1 10 Staff obtains Programmer, systems, Records, T Results / Process Orders est '[]'  - 0 Staff telephones HHA, Nursing Homes / Clarify orders / etc '[]'  - 0 Routine Transfer to another Facility (non-emergent condition) '[]'  - 0 Routine Hospital Admission (non-emergent condition) '[]'  - 0 New Admissions / Biomedical engineer / Ordering NPWT Apligraf, etc. , '[]'  - 0 Emergency Hospital Admission (emergent condition) X- 1 10 Simple Discharge Coordination '[]'  - 0 Complex (extensive) Discharge Coordination PROCESS - Special Needs '[]'  - 0 Pediatric / Minor Patient Management '[]'  - 0 Isolation Patient Management '[]'  - 0 Hearing / Language / Visual special needs '[]'  - 0 Assessment of Community assistance (transportation, D/C planning, etc.) '[]'  - 0 Additional assistance / Altered mentation '[]'  -  0 Support Surface(s) Assessment (bed, cushion, seat, etc.) INTERVENTIONS - Wound Cleansing / Measurement X - Simple Wound Cleansing - one wound 1 5 '[]'  - 0 Complex Wound Cleansing - multiple wounds X-  1 5 Wound Imaging (photographs - any number of wounds) '[]'  - 0 Wound Tracing (instead of photographs) X- 1 5 Simple Wound Measurement - one wound '[]'  - 0 Complex Wound Measurement - multiple wounds INTERVENTIONS - Wound Dressings X - Small Wound Dressing one or multiple wounds 1 10 '[]'  - 0 Medium Wound Dressing one or multiple wounds '[]'  - 0 Large Wound Dressing one or multiple wounds '[]'  - 0 Application of Medications - topical '[]'  - 0 Application of Medications - injection INTERVENTIONS - Miscellaneous '[]'  - 0 External ear exam '[]'  - 0 Specimen Collection (cultures, biopsies, blood, body fluids, etc.) '[]'  - 0 Specimen(s) / Culture(s) sent or taken to Lab for analysis '[]'  - 0 Patient Transfer (multiple staff / Civil Service fast streamer / Similar devices) '[]'  - 0 Simple Staple / Suture removal (25 or less) '[]'  - 0 Complex Staple / Suture removal (26 or more) '[]'  - 0 Hypo / Hyperglycemic Management (close monitor of Blood Glucose) '[]'  - 0 Ankle / Brachial Index (ABI) - do not check if billed separately X- 1 5 Vital Signs Has the patient been seen at the hospital within the last three years: Yes Total Score: 110 Level Of Care: New/Established - Level 3 Electronic Signature(s) Signed: 04/24/2020 5:43:17 PM By: Jamie Hammock RN Entered By: [name] on 04/24/2020 14:16:09 -------------------------------------------------------------------------------- Encounter Discharge Information Details Patient Name: Date of Service: Jamie Boyden A. 04/24/2020 1:15 PM Medical Record Number: [id number] Patient Account Number: [id number] Date of Birth/Sex: Treating RN: [birth year] (40 y.o. Nancy Fetter Primary Care Provider: Hendricks [name] Other Clinician: Referring Provider: Treating  Provider/Extender: [name] in Treatment: 199 Encounter Discharge Information Items Discharge Condition: Stable Ambulatory Status: Wheelchair Discharge Destination: Home Transportation: Private Auto Accompanied By: caregiver Schedule Follow-up Appointment: Yes Clinical Summary of Care: Patient Declined Electronic Signature(s) Signed: 04/24/2020 5:33:10 PM By: Levan Hurst RN, BSN Entered By: Levan Hurst on 04/24/2020 17:04:56 -------------------------------------------------------------------------------- Multi Wound Chart Details Patient Name: Date of Service: Jamie Boyden A. 04/24/2020 1:15 PM Medical Record Number: [id number] Patient Account Number: [id number] Date of Birth/Sex: Treating RN: 01-10-1981 (40 y.o. [name], [name] Primary Care Provider: Hendricks [name] Other Clinician: Referring Provider: Treating Provider/Extender: [name] in Treatment: 199 Vital Signs Height(in): 65 Pulse(bpm): 88 Weight(lbs): 201 Blood Pressure(mmHg): 136/85 Body Mass Index(BMI): 33 Temperature(F): 98.5 Respiratory Rate(breaths/min): 18 Photos: [1:No Photos Medial Sacrum] [N/A:N/A N/A] Wound Location: [1:Pressure Injury] [N/A:N/A] Wounding Event: [1:Pressure Ulcer] [N/A:N/A] Primary Etiology: [1:Anemia, Osteomyelitis] [N/A:N/A] Comorbid History: [1:05/15/2016] [N/A:N/A] Date Acquired: [4:174] [N/A:N/A] Weeks of Treatment: [1:Open] [N/A:N/A] Wound Status: [1:1.5x1.5x2.6] [N/A:N/A] Measurements L x W x D (cm) [1:1.767] [N/A:N/A] A (cm) : rea [1:4.595] [N/A:N/A] Volume (cm) : [1:94.60%] [N/A:N/A] % Reduction in A rea: [1:96.80%] [N/A:N/A] % Reduction in Volume: [1:12] Starting Position 1 (o'clock): [1:3] Ending Position 1 (o'clock): [1:1.9] Maximum Distance 1 (cm): [1:Yes] [N/A:N/A] Undermining: [1:Category/Stage IV] [N/A:N/A] Classification: [1:Medium] [N/A:N/A] Exudate A mount: [1:Serosanguineous]  [N/A:N/A] Exudate Type: [1:red, brown] [N/A:N/A] Exudate Color: [1:Well defined, not attached] [N/A:N/A] Wound Margin: [1:Large (67-100%)] [N/A:N/A] Granulation A mount: [1:Pink] [N/A:N/A] Granulation Quality: [1:None Present (0%)] [N/A:N/A] Necrotic A mount: [1:Fat Layer (Subcutaneous Tissue): Yes N/A] Exposed Structures: [1:Fascia: No Tendon: No Muscle: No Joint: No Bone: No Small (1-33%)] [N/A:N/A] Treatment Notes Electronic Signature(s) Signed: 04/24/2020 5:22:30 PM By: Linton Ham MD Signed: 04/24/2020 5:43:17 PM By: Jamie Hammock RN Entered By: Linton Ham on 04/24/2020 14:16:49 -------------------------------------------------------------------------------- Multi-Disciplinary Care Plan Details Patient Name: Date of Service: Jamie Boyden A. 04/24/2020  1:15 PM Medical Record Number: [id number] Patient Account Number: [id number] Date of Birth/Sex: Treating RN: [birth year] (40 y.o. [name], [name] Primary Care Provider: Hendricks [name] Other Clinician: Referring Provider: Treating Provider/Extender: [name] in Treatment: [place] reviewed with physician Active Inactive Pressure Nursing Diagnoses: Knowledge deficit related to management of pressures ulcers Potential for impaired tissue integrity related to pressure, friction, moisture, and shear Goals: Patient will remain free from development of additional pressure ulcers Date Initiated: 06/27/2016 Date Inactivated: 01/17/2019 Target Resolution Date: 12/24/2018 Goal Status: Met Patient/caregiver will verbalize risk factors for pressure ulcer development Date Initiated: 06/27/2016 Date Inactivated: 07/08/2017 Target Resolution Date: 07/08/2017 Goal Status: Met Patient/caregiver will verbalize understanding of pressure ulcer management Date Initiated: 06/27/2016 Target Resolution Date: 05/04/2020 Goal Status: Active Interventions: Assess: immobility,  friction, shearing, incontinence upon admission and as needed Assess offloading mechanisms upon admission and as needed Assess potential for pressure ulcer upon admission and as needed Provide education on pressure ulcers Treatment Activities: Patient referred for pressure reduction/relief devices : 06/27/2016 Pressure reduction/relief device ordered : 06/27/2016 Notes: Wound/Skin Impairment Nursing Diagnoses: Impaired tissue integrity Knowledge deficit related to smoking impact on wound healing Knowledge deficit related to ulceration/compromised skin integrity Goals: Patient will demonstrate a reduced rate of smoking or cessation of smoking Date Initiated: 06/27/2016 Date Inactivated: 01/26/2017 Target Resolution Date: 01/08/2017 Goal Status: Unmet Unmet Reason: pt continues to smoke Patient/caregiver will verbalize understanding of skin care regimen Date Initiated: 06/27/2016 Target Resolution Date: 05/04/2020 Goal Status: Active Ulcer/skin breakdown will have a volume reduction of 50% by week 8 Date Initiated: 06/27/2016 Date Inactivated: 12/11/2016 Target Resolution Date: 07/25/2016 Goal Status: Met Interventions: Assess patient/caregiver ability to obtain necessary supplies Assess patient/caregiver ability to perform ulcer/skin care regimen upon admission and as needed Assess ulceration(s) every visit Provide education on smoking Provide education on ulcer and skin care Treatment Activities: Skin care regimen initiated : 06/27/2016 Topical wound management initiated : 06/27/2016 Notes: Electronic Signature(s) Signed: 04/24/2020 5:43:17 PM By: Jamie Hammock RN Entered By: [name] on 04/24/2020 14:15:19 -------------------------------------------------------------------------------- Pain Assessment Details Patient Name: Date of Service: [name], Jamie A. 04/24/2020 1:15 PM Medical Record Number: [id number] Patient Account Number: [id number] Date of  Birth/Sex: Treating RN: [birth year] (40 y.o. [name], [name] Primary Care Provider: Hendricks [name] Other Clinician: Referring Provider: Treating Provider/Extender: [name] in Treatment: 199 Active Problems Location of Pain Severity and Description of Pain Patient Has Paino No Site Locations Pain Management and Medication Current Pain Management: Electronic Signature(s) Signed: 04/24/2020 5:43:17 PM By: Jamie Hammock RN Signed: 04/25/2020 10:45:54 AM By: [name] Entered By: [name] on 04/24/2020 13:43:57 -------------------------------------------------------------------------------- Patient/Caregiver Education Details Patient Name: Date of Service: [name] 2/22/2022andnbsp1:15 PM Medical Record Number: [id number] Patient Account Number: [id number] Date of Birth/Gender: Treating RN: 19-Jun-1980 (40 y.o. Benjaman Lobe Primary Care Physician: [name] Other Clinician: Referring Physician: Treating Physician/Extender: [name] in Treatment: 199 Education Assessment Education Provided To: Patient Education Topics Provided Wound/Skin Impairment: Methods: Explain/Verbal Responses: State content correctly Electronic Signature(s) Signed: 04/24/2020 5:43:17 PM By: Jamie Hammock RN Entered By: [name] on 04/24/2020 14:15:08 -------------------------------------------------------------------------------- Wound Assessment Details Patient Name: Date of Service: [name], [name] 04/24/2020 1:15 PM Medical Record Number: [id number] Patient Account Number: [id number] Date of Birth/Sex: Treating RN: [birth year] (40 y.o. Benjaman Lobe Primary Care Provider: Hendricks [name] Other Clinician: Referring Provider: Treating Provider/Extender: [name] in Treatment: 199 Wound Status Wound Number:  1 Primary Etiology: Pressure  Ulcer Wound Location: Medial Sacrum Wound Status: Open Wounding Event: Pressure Injury Comorbid History: Anemia, Osteomyelitis Date Acquired: 05/15/2016 Weeks Of Treatment: 199 Clustered Wound: No Wound Measurements Length: (cm) 1.5 Width: (cm) 1.5 Depth: (cm) 2.6 Area: (cm) 1.767 Volume: (cm) 4.595 % Reduction in Area: 94.6% % Reduction in Volume: 96.8% Epithelialization: Small (1-33%) Tunneling: No Undermining: Yes Starting Position (o'clock): 12 Ending Position (o'clock): 3 Maximum Distance: (cm) 1.9 Wound Description Classification: Category/Stage IV Wound Margin: Well defined, not attached Exudate Amount: Medium Exudate Type: Serosanguineous Exudate Color: red, brown Foul Odor After Cleansing: No Slough/Fibrino No Wound Bed Granulation Amount: Large (67-100%) Exposed Structure Granulation Quality: Pink Fascia Exposed: No Necrotic Amount: None Present (0%) Fat Layer (Subcutaneous Tissue) Exposed: Yes Tendon Exposed: No Muscle Exposed: No Joint Exposed: No Bone Exposed: No Treatment Notes Wound #1 (Sacrum) Wound Laterality: Medial Cleanser Wound Cleanser Discharge Instruction: Cleanse the wound with wound cleanser prior to applying a clean dressing using gauze sponges, not tissue or cotton balls. Wound Cleanser Discharge Instruction: Cleanse the wound with wound cleanser prior to applying a clean dressing using gauze sponges, not tissue or cotton balls. Peri-Wound Care Topical Primary Dressing Curity Gauze Sponge Non-Sterile, 4x4 (in/in) Discharge Instruction: anasept moistened gauze packing Secondary Dressing ABD Pad, 5x9 Discharge Instruction: Apply over primary dressing as directed. Secured With 12M Medipore H Soft Cloth Surgical T 4 x 2 (in/yd) ape Discharge Instruction: Secure dressing with tape as directed. Compression Wrap Compression Stockings Add-Ons Electronic Signature(s) Signed: 04/24/2020 5:33:10 PM By: Levan Hurst RN, BSN Signed:  04/24/2020 5:43:17 PM By: Jamie Hammock RN Entered By: Levan Hurst on 04/24/2020 13:58:42 -------------------------------------------------------------------------------- Vitals Details Patient Name: Date of Service: [name], [name] 04/24/2020 1:15 PM Medical Record Number: [id number] Patient Account Number: [id number] Date of Birth/Sex: Treating RN: [birth year] (40 y.o. [name], [name] Primary Care Provider: Hendricks [name] Other Clinician: Referring Provider: Treating Provider/Extender: [name] in Treatment: 199 Vital Signs Time Taken: 13:43 Temperature (F): 98.5 Height (in): 65 Pulse (bpm): 88 Weight (lbs): 201 Respiratory Rate (breaths/min): 18 Body Mass Index (BMI): 33.4 Blood Pressure (mmHg): 136/85 Reference Range: 80 - 120 mg / dl Electronic Signature(s) Signed: 04/25/2020 10:45:54 AM By: [name] Entered By: [name] on 04/24/2020 13:43:50

## 2020-05-04 ENCOUNTER — Ambulatory Visit (HOSPITAL_COMMUNITY): Payer: Medicaid Other

## 2020-05-14 ENCOUNTER — Encounter (HOSPITAL_BASED_OUTPATIENT_CLINIC_OR_DEPARTMENT_OTHER): Payer: Medicaid Other | Attending: Internal Medicine | Admitting: Internal Medicine

## 2020-05-14 ENCOUNTER — Other Ambulatory Visit: Payer: Self-pay

## 2020-05-14 DIAGNOSIS — G8222 Paraplegia, incomplete: Secondary | ICD-10-CM | POA: Diagnosis not present

## 2020-05-14 DIAGNOSIS — L89154 Pressure ulcer of sacral region, stage 4: Secondary | ICD-10-CM | POA: Insufficient documentation

## 2020-05-14 NOTE — Progress Notes (Addendum)
[name] ([id number]) Visit Report for 05/14/2020 Arrival Information Details Patient Name: Date of Service: [name], [name] 05/14/2020 1:15 PM Medical Record Number: [id number] Patient Account Number: [id number] Date of Birth/Sex: Treating RN: [birth year] (40 y.o. [name], [name] Primary Care Zuhayr Deeney: [name] Other Clinician: Referring Eulalio Reamy: Treating Jaeli Grubb/Extender: Mare Ferrari in Treatment: 71 Visit Information History Since Last Visit Added or deleted any medications: No Patient Arrived: Wheel Chair Any new allergies or adverse reactions: No Arrival Time: 13:27 Had a fall or experienced change in No Accompanied By: caregiver activities of daily living that may affect Transfer Assistance: Harrel Lemon Lift risk of falls: Patient Identification Verified: Yes Signs or symptoms of abuse/neglect since last visito No Secondary Verification Process Completed: Yes Hospitalized since last visit: No Patient Requires Transmission-Based Precautions: No Implantable device outside of the clinic excluding No Patient Has Alerts: No cellular tissue based products placed in the center since last visit: Has Dressing in Place as Prescribed: Yes Pain Present Now: No Electronic Signature(s) Signed: 05/14/2020 5:40:01 PM By: Rhae Hammock RN Entered By: Rhae Hammock on 05/14/2020 13:28:11 -------------------------------------------------------------------------------- Clinic Level of Care Assessment Details Patient Name: Date of Service: [name] 05/14/2020 1:15 PM Medical Record Number: [id number] Patient Account Number: [id number] Date of Birth/Sex: Treating RN: 1980/06/23 (40 y.o. Nancy Fetter Primary Care Isaura Schiller: [name] Other Clinician: Referring Dailey Buccheri: Treating Maydelin Deming/Extender: Mare Ferrari in Treatment: 31 Clinic Level of Care Assessment Items TOOL 4 Quantity Score X-  1 0 Use when only an EandM is performed on FOLLOW-UP visit ASSESSMENTS - Nursing Assessment / Reassessment X- 1 10 Reassessment of Co-morbidities (includes updates in patient status) X- 1 5 Reassessment of Adherence to Treatment Plan ASSESSMENTS - Wound and Skin A ssessment / Reassessment X - Simple Wound Assessment / Reassessment - one wound 1 5 _0  - 0 Complex Wound Assessment / Reassessment - multiple wounds _1  - 0 Dermatologic / Skin Assessment (not related to wound area) ASSESSMENTS - Focused Assessment _2  - 0 Circumferential Edema Measurements - multi extremities _3  - 0 Nutritional Assessment / Counseling / Intervention _4  - 0 Lower Extremity Assessment (monofilament, tuning fork, pulses) _5  - 0 Peripheral Arterial Disease Assessment (using hand held doppler) ASSESSMENTS - Ostomy and/or Continence Assessment and Care _6  - 0 Incontinence Assessment and Management _7  - 0 Ostomy Care Assessment and Management (repouching, etc.) PROCESS - Coordination of Care _8  - 0 Simple Patient / Family Education for ongoing care X- 1 20 Complex (extensive) Patient / Family Education for ongoing care X- 1 10 Staff obtains Programmer, systems, Records, T Results / Process Orders est _9  - 0 Staff telephones HHA, Nursing Homes / Clarify orders / etc _10  - 0 Routine Transfer to another Facility (non-emergent condition) _11  - 0 Routine Hospital Admission (non-emergent condition) _12  - 0 New Admissions / Biomedical engineer / Ordering NPWT Apligraf, etc. , _13  - 0 Emergency Hospital Admission (emergent condition) _14  - 0 Simple Discharge Coordination _15  - 0 Complex (extensive) Discharge Coordination PROCESS - Special Needs _16  - 0 Pediatric / Minor Patient Management _17  - 0 Isolation Patient Management _18  - 0 Hearing / Language / Visual special needs _19  - 0 Assessment of Community assistance (transportation, D/C planning, etc.) _20  - 0 Additional assistance / Altered mentation X- 1  15 Support Surface(s) Assessment (bed, cushion, seat, etc.) INTERVENTIONS - Wound Cleansing / Measurement X - Simple Wound Cleansing - one wound 1 5 _21  - 0 Complex Wound Cleansing - multiple wounds  X- 1 5 Wound Imaging (photographs - any number of wounds) _0  - 0 Wound Tracing (instead of photographs) X- 1 5 Simple Wound Measurement - one wound _1  - 0 Complex Wound Measurement - multiple wounds INTERVENTIONS - Wound Dressings _2  - 0 Small Wound Dressing one or multiple wounds X- 1 15 Medium Wound Dressing one or multiple wounds _3  - 0 Large Wound Dressing one or multiple wounds X- 1 5 Application of Medications - topical <[id number]>_9<\/[id number]>_8  - 0 Application of Medications - injection INTERVENTIONS - Miscellaneous _5  - 0 External ear exam _6  - 0 Specimen Collection (cultures, biopsies, blood, body fluids, etc.) _7  - 0 Specimen(s) / Culture(s) sent or taken to Lab for analysis _8  - 0 Patient Transfer (multiple staff / Civil Service fast streamer / Similar devices) _9  - 0 Simple Staple / Suture removal (25 or less) _10  - 0 Complex Staple / Suture removal (26 or more) _11  - 0 Hypo / Hyperglycemic Management (close monitor of Blood Glucose) _12  - 0 Ankle / Brachial Index (ABI) - do not check if billed separately X- 1 5 Vital Signs Has the patient been seen at the hospital within the last three years: Yes Total Score: 105 Level Of Care: New/Established - Level 3 Electronic Signature(s) Signed: 05/14/2020 5:53:54 PM By: Lorrin Jackson Entered By: Lorrin Jackson on 05/14/2020 14:14:55 -------------------------------------------------------------------------------- Encounter Discharge Information Details Patient Name: Date of Service: Jamie Boyden A. 05/14/2020 1:15 PM Medical Record Number: [id number] Patient Account Number: [id number] Date of Birth/Sex: Treating RN: 04-07-1980 (40 y.o. [name], [name] Primary Care Eithan Beagle: [name] Other Clinician: Referring Estel Tonelli: Treating  Mickle Campton/Extender: Mare Ferrari in Treatment: 202 Encounter Discharge Information Items Discharge Condition: Stable Ambulatory Status: Wheelchair Discharge Destination: Home Transportation: Private Auto Accompanied By: Mady Gemma Schedule Follow-up Appointment: Yes Clinical Summary of Care: Patient Declined Electronic Signature(s) Signed: 05/14/2020 5:40:01 PM By: Rhae Hammock RN Entered By: Rhae Hammock on 05/14/2020 14:18:55 -------------------------------------------------------------------------------- Lower Extremity Assessment Details Patient Name: Date of Service: [name], Jamie A. 05/14/2020 1:15 PM Medical Record Number: [id number] Patient Account Number: [id number] Date of Birth/Sex: Treating RN: [birth year] (40 y.o. [name], [name] Primary Care Kunal Levario: [name] Other Clinician: Referring Theador Jezewski: Treating Einer Meals/Extender: Mare Ferrari in Treatment: 202 Electronic Signature(s) Signed: 05/14/2020 5:40:01 PM By: Rhae Hammock RN Entered By: Rhae Hammock on 05/14/2020 13:33:04 -------------------------------------------------------------------------------- Multi Wound Chart Details Patient Name: Date of Service: Jamie Boyden A. 05/14/2020 1:15 PM Medical Record Number: [id number] Patient Account Number: [id number] Date of Birth/Sex: Treating RN: [birth year] (40 y.o. Nancy Fetter Primary Care Anjoli Diemer: [name] Other Clinician: Referring Denson Niccoli: Treating Ilena Dieckman/Extender: Mare Ferrari in Treatment: 202 Vital Signs Height(in): 65 Pulse(bpm): 75 Weight(lbs): 201 Blood Pressure(mmHg): 86/61 Body Mass Index(BMI): 33 Temperature(F): 98 Respiratory Rate(breaths/min): 46 Photos: [1:No Photos Medial Sacrum] [N/A:N/A N/A] Wound Location: [1:Pressure Injury] [N/A:N/A] Wounding Event: [1:Pressure Ulcer] [N/A:N/A] Primary Etiology: [1:Anemia,  Osteomyelitis] [N/A:N/A] Comorbid History: [1:05/15/2016] [N/A:N/A] Date Acquired: [1:202] [N/A:N/A] Weeks of Treatment: [1:Open] [N/A:N/A] Wound Status: [1:2x2x3.5] [N/A:N/A] Measurements L x W x D (cm) [1:3.142] [N/A:N/A] A (cm) : rea [1:10.996] [N/A:N/A] Volume (cm) : [1:90.40%] [N/A:N/A] % Reduction in A rea: [1:92.40%] [N/A:N/A] % Reduction in Volume: [1:Category/Stage IV] [N/A:N/A] Classification: [1:Medium] [N/A:N/A] Exudate A mount: [1:Serosanguineous] [N/A:N/A] Exudate Type: [1:red, brown] [N/A:N/A] Exudate Color: [1:Well defined, not attached] [N/A:N/A] Wound Margin: [1:Large (67-100%)] [N/A:N/A] Granulation A mount: [1:Pink] [N/A:N/A] Granulation Quality: [1:None Present (0%)] [N/A:N/A] Necrotic A mount: [1:Fat Layer (Subcutaneous Tissue): Yes N/A] Exposed Structures: [1:Fascia: No Tendon:  No Muscle: No Joint: No Bone: No Small (1-33%)] [N/A:N/A] Treatment Notes Electronic Signature(s) Signed: 05/14/2020 5:58:13 PM By: Linton Ham MD Signed: 05/14/2020 6:16:37 PM By: Levan Hurst RN, BSN Entered By: Linton Ham on 05/14/2020 14:13:12 -------------------------------------------------------------------------------- Multi-Disciplinary Care Plan Details Patient Name: Date of Service: [name], Jamie A. 05/14/2020 1:15 PM Medical Record Number: [id number] Patient Account Number: [id number] Date of Birth/Sex: Treating RN: Sep 01, 1980 (40 y.o. Nancy Fetter Primary Care Katrinna Travieso: [name] Other Clinician: Referring Anja Neuzil: Treating Sidney Silberman/Extender: Mare Ferrari in Treatment: [place] reviewed with physician Active Inactive Pressure Nursing Diagnoses: Knowledge deficit related to management of pressures ulcers Potential for impaired tissue integrity related to pressure, friction, moisture, and shear Goals: Patient will remain free from development of additional pressure ulcers Date Initiated:  06/27/2016 Date Inactivated: 01/17/2019 Target Resolution Date: 12/24/2018 Goal Status: Met Patient/caregiver will verbalize risk factors for pressure ulcer development Date Initiated: 06/27/2016 Date Inactivated: 07/08/2017 Target Resolution Date: 07/08/2017 Goal Status: Met Patient/caregiver will verbalize understanding of pressure ulcer management Date Initiated: 06/27/2016 Target Resolution Date: 06/18/2020 Goal Status: Active Interventions: Assess: immobility, friction, shearing, incontinence upon admission and as needed Assess offloading mechanisms upon admission and as needed Assess potential for pressure ulcer upon admission and as needed Provide education on pressure ulcers Treatment Activities: Patient referred for pressure reduction/relief devices : 06/27/2016 Pressure reduction/relief device ordered : 06/27/2016 Notes: Wound/Skin Impairment Nursing Diagnoses: Impaired tissue integrity Knowledge deficit related to smoking impact on wound healing Knowledge deficit related to ulceration/compromised skin integrity Goals: Patient will demonstrate a reduced rate of smoking or cessation of smoking Date Initiated: 06/27/2016 Date Inactivated: 01/26/2017 Target Resolution Date: 01/08/2017 Goal Status: Unmet Unmet Reason: pt continues to smoke Patient/caregiver will verbalize understanding of skin care regimen Date Initiated: 06/27/2016 Target Resolution Date: 06/18/2020 Goal Status: Active Ulcer/skin breakdown will have a volume reduction of 50% by week 8 Date Initiated: 06/27/2016 Date Inactivated: 12/11/2016 Target Resolution Date: 07/25/2016 Goal Status: Met Interventions: Assess patient/caregiver ability to obtain necessary supplies Assess patient/caregiver ability to perform ulcer/skin care regimen upon admission and as needed Assess ulceration(s) every visit Provide education on smoking Provide education on ulcer and skin care Treatment Activities: Skin care regimen  initiated : 06/27/2016 Topical wound management initiated : 06/27/2016 Notes: Electronic Signature(s) Signed: 05/14/2020 1:03:57 PM By: Lorrin Jackson Signed: 05/14/2020 6:16:37 PM By: Levan Hurst RN, BSN Entered By: Lorrin Jackson on 05/14/2020 13:03:56 -------------------------------------------------------------------------------- Pain Assessment Details Patient Name: Date of Service: [name], Jamie A. 05/14/2020 1:15 PM Medical Record Number: [id number] Patient Account Number: [id number] Date of Birth/Sex: Treating RN: 1980/09/11 (40 y.o. [name], [name] Primary Care Reshanda Lewey: [name] Other Clinician: Referring Rubye Strohmeyer: Treating Muhamed Luecke/Extender: Mare Ferrari in Treatment: 202 Active Problems Location of Pain Severity and Description of Pain Patient Has Paino Yes Site Locations Pain Location: Pain in Ulcers Duration of the Pain. Constant / Intermittento Intermittent Rate the pain. Current Pain Level: 5 Worst Pain Level: 10 Least Pain Level: 0 Tolerable Pain Level: 5 Character of Pain Describe the Pain: Aching Pain Management and Medication Current Pain Management: Medication: Yes Cold Application: No Rest: Yes Massage: No Activity: No T.E.N.S.: No Heat Application: No Leg drop or elevation: No Is the Current Pain Management Adequate: Adequate How does your wound impact your activities of daily livingo Sleep: No Bathing: No Appetite: No Relationship With Others: No Bladder Continence: No Emotions: No Bowel Continence: No Work: No Toileting: No Drive: No Dressing: No Hobbies: No Engineer, maintenance) Signed:  05/14/2020 5:40:01 PM By: Rhae Hammock RN Entered By: Rhae Hammock on 05/14/2020 13:32:58 -------------------------------------------------------------------------------- Patient/Caregiver Education Details Patient Name: Date of Service: Karlton Lemon 3/14/2022andnbsp1:15 PM Medical  Record Number: [id number] Patient Account Number: [id number] Date of Birth/Gender: Treating RN: 07/07/1980 (40 y.o. Nancy Fetter Primary Care Physician: [name] Other Clinician: Referring Physician: Treating Physician/Extender: Mare Ferrari in Treatment: 202 Education Assessment Education Provided To: Patient Education Topics Provided Pressure: Methods: Explain/Verbal, Printed Responses: State content correctly Smoking and Wound Healing: Responses: Reinforcements needed Wound/Skin Impairment: Methods: Demonstration, Explain/Verbal, Printed Responses: State content correctly Electronic Signature(s) Signed: 05/14/2020 5:53:54 PM By: Lorrin Jackson Entered By: Lorrin Jackson on 05/14/2020 13:04:25 -------------------------------------------------------------------------------- Wound Assessment Details Patient Name: Date of Service: [name], Jamie A. 05/14/2020 1:15 PM Medical Record Number: [id number] Patient Account Number: [id number] Date of Birth/Sex: Treating RN: 13-Dec-1980 (40 y.o. [name], [name] Primary Care Chaitra Mast: [name] Other Clinician: Referring Ezra Denne: Treating Sharlet Notaro/Extender: Mare Ferrari in Treatment: 202 Wound Status Wound Number: 1 Primary Etiology: Pressure Ulcer Wound Location: Medial Sacrum Wound Status: Open Wounding Event: Pressure Injury Comorbid History: Anemia, Osteomyelitis Date Acquired: 05/15/2016 Weeks Of Treatment: 202 Clustered Wound: No Photos Wound Measurements Length: (cm) 2 Width: (cm) 2 Depth: (cm) 3.5 Area: (cm) 3.142 Volume: (cm) 10.996 % Reduction in Area: 90.4% % Reduction in Volume: 92.4% Epithelialization: Small (1-33%) Tunneling: No Undermining: No Wound Description Classification: Category/Stage IV Wound Margin: Well defined, not attached Exudate Amount: Medium Exudate Type: Serosanguineous Exudate Color: red, brown Foul Odor After  Cleansing: No Slough/Fibrino No Wound Bed Granulation Amount: Large (67-100%) Exposed Structure Granulation Quality: Pink Fascia Exposed: No Necrotic Amount: None Present (0%) Fat Layer (Subcutaneous Tissue) Exposed: Yes Tendon Exposed: No Muscle Exposed: No Joint Exposed: No Bone Exposed: No Treatment Notes Wound #1 (Sacrum) Wound Laterality: Medial Cleanser Wound Cleanser Discharge Instruction: Cleanse the wound with wound cleanser prior to applying a clean dressing using gauze sponges, not tissue or cotton balls. Wound Cleanser Discharge Instruction: Cleanse the wound with wound cleanser prior to applying a clean dressing using gauze sponges, not tissue or cotton balls. Peri-Wound Care Topical Primary Dressing FIBRACOL Plus Dressing, 2x2 in (collagen) Discharge Instruction: Moisten collagen with saline or hydrogel Curity Gauze Sponge Non-Sterile, 4x4 (in/in) Discharge Instruction: anasept moistened gauze packing Secondary Dressing ABD Pad, 5x9 Discharge Instruction: Apply over primary dressing as directed. Secured With 97M Medipore H Soft Cloth Surgical T 4 x 2 (in/yd) ape Discharge Instruction: Secure dressing with tape as directed. Compression Wrap Compression Stockings Add-Ons Electronic Signature(s) Signed: 05/15/2020 5:27:17 PM By: Sandre Kitty Signed: 05/16/2020 5:17:38 PM By: Rhae Hammock RN Previous Signature: 05/14/2020 5:40:01 PM Version By: Rhae Hammock RN Entered By: Sandre Kitty on 05/15/2020 17:10:17 -------------------------------------------------------------------------------- Vitals Details Patient Name: Date of Service: Jamie Boyden A. 05/14/2020 1:15 PM Medical Record Number: [id number] Patient Account Number: [id number] Date of Birth/Sex: Treating RN: 11-11-1980 (40 y.o. [name], [name] Primary Care Rosalin Buster: [name] Other Clinician: Referring Winter Jocelyn: Treating Analia Zuk/Extender: Mare Ferrari in Treatment: 202 Vital Signs Time Taken: 13:28 Temperature (F): 98 Height (in): 65 Pulse (bpm): 84 Weight (lbs): 201 Respiratory Rate (breaths/min): 17 Body Mass Index (BMI): 33.4 Blood Pressure (mmHg): 86/61 Reference Range: 80 - 120 mg / dl Electronic Signature(s) Signed: 05/14/2020 5:40:01 PM By: Rhae Hammock RN Entered By: Rhae Hammock on 05/14/2020 13:28:30

## 2020-05-14 NOTE — Progress Notes (Signed)
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 by Dr. Lovenia Shuck. Unfortunately the patient developed a wound infection and underwent debridement by Dr. Christella Noa on 01/08/2019. The growth in the culture revealed ESBL E. coli and diphtheroids and the patient was initially placed on  ertapenem him in doxycycline for 3 weeks. Subsequently she comes in today for reevaluation and it does appear that her wound is actually doing significantly better even compared to last time we saw her. This is in regard to the medial sacral region. 2/5; I have not seen this wound in almost 3 months. Certainly has gotten better in terms of surface area although there is significant direct depth. She has completed antibiotics for the underlying osteomyelitis. 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 She has been using silver alginate. Her mother is changing the dressing. She claims to be offloading this area except for when she gets up to go to doctors appointments 3/12; this is a punched-out wound on the lower sacrum. Has a depth of about 1.8 cm. It does not appear to undermine. There is no palpable bone. We have been using silver alginate packing her mother is changing the dressing she claims to be offloading this. She had a baclofen pump placed to alleviate her spasticity 5/17; the patient has not been seen in over 2 months. We are following her for a punch to open wound  on the lower sacrum remanent of a very large stage IV wound. Apparently they started to notice an odor and drainage earlier this month. Patient was admitted to [facility] from 5/3 through 07/12/2019. We do not have any records here. Apparently she was found to have pneumonia, a UTI. She also went underwent a surgical debridement of her lower sacral wound. She comes in today with a really large postoperative wound. This does not have exposed bone but very close. This is right back to where this was a year or 2 ago. They have been using wet-to-dry. She is on an IV antibiotic but is not sure what drugs. We are not sure what home health company they are currently using. We are trying to get records from [facility]. 6/8; this the patient return to clinic 3 weeks ago. She had surgical  debridement of the lower sacral wound. She apparently is completed her IV antibiotics although I am not exactly sure what IV antibiotics she had ordered. Her PICC line is come out. Her wound is large but generally clean there still is exposed bone. ooFortunately the area on the right heel is callused over I cannot prove there is an open area here per 6/22; they are using a wet to dry dressing when we left the clinic last time however they managed to find a box of silver alginate so they are using that with backing gauze. They are still changing this twice a day because of drainage issues. She has Medicaid and they will not be able to replace the want dressing she will have to go back to a wet-to-dry. In spite of this the wound actually looks quite good some improvement in dimension 7/13; using silver alginate. Slight improvement in overall wound volume including depth although there is still undermining. She tells me she is offloading this is much as possible 8/10-Patient back at 1 month, continuing to use silver alginate although she has run out and therefore using saline wet-to-dry, undermining is minimal, continuing attempts at offloading according to patient 9/21; its been about 9 weeks since I have seen this patient although I note she was seen once in August. Her wounds on the lower sacrum this looks a lot better than the last time I saw this. She is using silver alginate to the wound bed. They only have Medicaid therefore they have to need to pay out-of-pocket for the dressing supplies. This certainly limits options. 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 10/19; 1 month follow-up. Not much change in the wound in fact it is measuring slightly larger. Still using silver alginate which her mother is purchasing off [employer] I believe. Since she was here she had a suprapubic catheter placed because of a lacerated  urethra. 11/30; patient has not been here in about 6 weeks. She apparently has had 3 admissions to the hospital for pneumonia in the last 7 months 2 in the last 2 months. She came out of hospital this time with 4 L of oxygen. Her mother is using the Anasept wet-to-dry to the sacral wound and surprisingly this looks somewhat better. The patient states she is pending 24/7 in bed except for going to doctor's appointments this may be helping. She has an appointment with pulmonology on 12/17. She was a heavy smoker for a period of time I wonder whether she has COPD 12/28; patient is doing well she is off her oxygen which may have  something to do with chronic Macrobid she was on [hypersensitivity pneumonitis]. She is also stop smoking. In any case she is doing well from a breathing point of view. She is using Anasept wet-to-dry. 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 She has been using Anasept gel wet-to-dry and really has done a nice job here. She does not have insurance for other wound care products but truthfully so far this is done a really good job. I note she is back on oxygen. 2/22; stage IV wound on her lower sacrum. They have been to using an Anasept gel wet-to-dry-based dressing. Ordinarily I would like to use a moistened collagen wet-to-dry but she is apparently not able to afford this. There was also some suggestion about using Dakin's but apparently her mother could not make 3/14; 3-week follow-up. She is going for bladder surgery at [facility] next week. Still using Anasept gel wet-to-dry. We will try to get Prisma wet to dry through what I think is [facility]. This probably will not work Objective Constitutional Patient is hypotensive. However she appears well. Pulse regular and within target range for patient.[name] Respirations regular, non-labored and within target range.. Temperature is normal and within the target  range for the patient.[name] Appears in no distress. Vitals Time Taken: 1:28 PM, Height: 65 in, Weight: 201 lbs, BMI: 33.4, Temperature: 98 F, Pulse: 84 bpm, Respiratory Rate: 17 breaths/min, Blood Pressure: 86/61 mmHg. General Notes: Wound exam; wound actually measures larger. There is however no palpable bone or surrounding infection granulation looks generally healthy but the bottom part of the wound is right against bone however this is not new Integumentary (Hair, Skin) Wound #1 status is Open. Original cause of wound was Pressure Injury. The date acquired was: 05/15/2016. The wound has been in treatment 202 weeks. The wound is located on the Medial Sacrum. The wound measures 2cm length x 2cm width x 3.5cm depth; 3.142cm^2 area and 10.996cm^3 volume. There is Fat Layer (Subcutaneous Tissue) exposed. There is no tunneling or undermining noted. There is a medium amount of serosanguineous drainage noted. The wound margin is well defined and not attached to the wound base. There is large (67-100%) pink granulation within the wound bed. There is no necrotic tissue within the wound bed. Assessment Active Problems ICD-10 Pressure ulcer of sacral region, stage 4 Paraplegia, incomplete Plan Follow-up Appointments: Return appointment in 1 month. - **[place]** Bathing/ Shower/ Hygiene: May shower with protection but do not get wound dressing(s) wet. Off-Loading: Turn and reposition every 2 hours WOUND #1: - Sacrum Wound Laterality: Medial Cleanser: Wound Cleanser 1 x Per Day/30 Days Discharge Instructions: Cleanse the wound with wound cleanser prior to applying a clean dressing using gauze sponges, not tissue or cotton balls. Cleanser: Wound Cleanser (Generic) 1 x Per Day/30 Days Discharge Instructions: Cleanse the wound with wound cleanser prior to applying a clean dressing using gauze sponges, not tissue or cotton balls. Prim Dressing: FIBRACOL Plus Dressing, 2x2 in (collagen) 1 x Per Day/30  Days ary Discharge Instructions: Moisten collagen with saline or hydrogel Prim Dressing: Curity Gauze Sponge Non-Sterile, 4x4 (in/in) (Generic) 1 x Per Day/30 Days ary Discharge Instructions: anasept moistened gauze packing Secondary Dressing: ABD Pad, 5x9 1 x Per Day/30 Days Discharge Instructions: Apply over primary dressing as directed. Secured With: 73M Medipore H Soft Cloth Surgical T 4 x 2 (in/yd) (Generic) 1 x Per Day/30 Days ape Discharge Instructions: Secure dressing with tape  as directed. 1. At this point continue Anasept wet-to-dry 2. I am going to try to put a silver collagen or even plain collagen with backing wet-to-dry through her insurance. Usually Medicaid does not pay for wound care products just checking to make sure I have not overlooked anything. 3. Other than that I am not sure there is much difference we can do here. We could give her some leftover products however she would need to come more frequently to clinic I can present this is an option the next time we see her Electronic Signature(s) Signed: 05/14/2020 5:58:13 PM By: Linton Ham MD Entered By: Linton Ham on 05/14/2020 14:17:00 -------------------------------------------------------------------------------- SuperBill Details Patient Name: Date of Service: Jamie Boyden A. 05/14/2020 Medical Record Number: [id number] Patient Account Number: [id number] Date of Birth/Sex: Treating RN: 1980/04/28 (40 y.o. Nancy Fetter Primary Care Provider: [name] Other Clinician: Referring Provider: Treating Provider/Extender: Mare Ferrari in Treatment: 202 Diagnosis Coding ICD-10 Codes Code Description L89.154 Pressure ulcer of sacral region, stage 4 G82.22 Paraplegia, incomplete Facility Procedures CPT4 Code: [id number] Description: 99213 - WOUND CARE VISIT-LEV 3 EST PT Modifier: Quantity: 1 Physician Procedures : CPT4 Code Description Modifier [id number] - WC  PHYS LEVEL 3 - EST PT ICD-10 Diagnosis Description L89.154 Pressure ulcer of sacral region, stage 4 G82.22 Paraplegia, incomplete Quantity: 1 Electronic Signature(s) Signed: 05/14/2020 5:58:13 PM By: Linton Ham MD Entered By: Linton Ham on 05/14/2020 14:17:21

## 2020-05-18 ENCOUNTER — Encounter (HOSPITAL_BASED_OUTPATIENT_CLINIC_OR_DEPARTMENT_OTHER): Payer: Medicaid Other | Admitting: Internal Medicine

## 2020-05-30 ENCOUNTER — Encounter (HOSPITAL_COMMUNITY): Payer: Self-pay

## 2020-05-30 ENCOUNTER — Inpatient Hospital Stay (HOSPITAL_COMMUNITY)
Admission: EM | Admit: 2020-05-30 | Discharge: 2020-06-05 | DRG: 853 | Disposition: A | Discharge status: Home / Self Care | Payer: Medicaid Other | Attending: Internal Medicine | Admitting: Internal Medicine

## 2020-05-30 ENCOUNTER — Other Ambulatory Visit: Payer: Self-pay

## 2020-05-30 ENCOUNTER — Emergency Department (HOSPITAL_COMMUNITY): Payer: Medicaid Other

## 2020-05-30 ENCOUNTER — Ambulatory Visit (HOSPITAL_COMMUNITY): Admission: RE | Admit: 2020-05-30 | Payer: Medicaid Other | Source: Ambulatory Visit

## 2020-05-30 DIAGNOSIS — G629 Polyneuropathy, unspecified: Secondary | ICD-10-CM | POA: Diagnosis present

## 2020-05-30 DIAGNOSIS — R6521 Severe sepsis with septic shock: Secondary | ICD-10-CM | POA: Diagnosis present

## 2020-05-30 DIAGNOSIS — J9611 Chronic respiratory failure with hypoxia: Secondary | ICD-10-CM | POA: Diagnosis present

## 2020-05-30 DIAGNOSIS — Z888 Allergy status to other drugs, medicaments and biological substances status: Secondary | ICD-10-CM

## 2020-05-30 DIAGNOSIS — G8220 Paraplegia, unspecified: Secondary | ICD-10-CM | POA: Diagnosis present

## 2020-05-30 DIAGNOSIS — F32A Depression, unspecified: Secondary | ICD-10-CM | POA: Diagnosis present

## 2020-05-30 DIAGNOSIS — I10 Essential (primary) hypertension: Secondary | ICD-10-CM | POA: Diagnosis present

## 2020-05-30 DIAGNOSIS — E876 Hypokalemia: Secondary | ICD-10-CM | POA: Diagnosis present

## 2020-05-30 DIAGNOSIS — G8929 Other chronic pain: Secondary | ICD-10-CM | POA: Diagnosis not present

## 2020-05-30 DIAGNOSIS — Z87891 Personal history of nicotine dependence: Secondary | ICD-10-CM | POA: Diagnosis not present

## 2020-05-30 DIAGNOSIS — Z79890 Hormone replacement therapy (postmenopausal): Secondary | ICD-10-CM

## 2020-05-30 DIAGNOSIS — E039 Hypothyroidism, unspecified: Secondary | ICD-10-CM | POA: Diagnosis present

## 2020-05-30 DIAGNOSIS — L89154 Pressure ulcer of sacral region, stage 4: Secondary | ICD-10-CM | POA: Diagnosis not present

## 2020-05-30 DIAGNOSIS — E785 Hyperlipidemia, unspecified: Secondary | ICD-10-CM | POA: Diagnosis present

## 2020-05-30 DIAGNOSIS — Z79899 Other long term (current) drug therapy: Secondary | ICD-10-CM

## 2020-05-30 DIAGNOSIS — G894 Chronic pain syndrome: Secondary | ICD-10-CM | POA: Diagnosis present

## 2020-05-30 DIAGNOSIS — A4151 Sepsis due to Escherichia coli [E. coli]: Secondary | ICD-10-CM | POA: Diagnosis not present

## 2020-05-30 DIAGNOSIS — A419 Sepsis, unspecified organism: Secondary | ICD-10-CM

## 2020-05-30 DIAGNOSIS — L8990 Pressure ulcer of unspecified site, unspecified stage: Secondary | ICD-10-CM | POA: Diagnosis present

## 2020-05-30 DIAGNOSIS — R1030 Lower abdominal pain, unspecified: Secondary | ICD-10-CM | POA: Insufficient documentation

## 2020-05-30 DIAGNOSIS — Z6841 Body Mass Index (BMI) 40.0 and over, adult: Secondary | ICD-10-CM

## 2020-05-30 DIAGNOSIS — K219 Gastro-esophageal reflux disease without esophagitis: Secondary | ICD-10-CM | POA: Diagnosis present

## 2020-05-30 DIAGNOSIS — K559 Vascular disorder of intestine, unspecified: Secondary | ICD-10-CM

## 2020-05-30 DIAGNOSIS — F419 Anxiety disorder, unspecified: Secondary | ICD-10-CM | POA: Diagnosis present

## 2020-05-30 DIAGNOSIS — Z881 Allergy status to other antibiotic agents status: Secondary | ICD-10-CM

## 2020-05-30 DIAGNOSIS — I959 Hypotension, unspecified: Secondary | ICD-10-CM | POA: Diagnosis present

## 2020-05-30 DIAGNOSIS — E669 Obesity, unspecified: Secondary | ICD-10-CM | POA: Diagnosis present

## 2020-05-30 DIAGNOSIS — K592 Neurogenic bowel, not elsewhere classified: Secondary | ICD-10-CM | POA: Diagnosis present

## 2020-05-30 DIAGNOSIS — M4628 Osteomyelitis of vertebra, sacral and sacrococcygeal region: Secondary | ICD-10-CM | POA: Diagnosis present

## 2020-05-30 DIAGNOSIS — E872 Acidosis: Secondary | ICD-10-CM | POA: Diagnosis present

## 2020-05-30 DIAGNOSIS — N319 Neuromuscular dysfunction of bladder, unspecified: Secondary | ICD-10-CM | POA: Diagnosis present

## 2020-05-30 DIAGNOSIS — Z8349 Family history of other endocrine, nutritional and metabolic diseases: Secondary | ICD-10-CM

## 2020-05-30 DIAGNOSIS — Z993 Dependence on wheelchair: Secondary | ICD-10-CM

## 2020-05-30 DIAGNOSIS — Y846 Urinary catheterization as the cause of abnormal reaction of the patient, or of later complication, without mention of misadventure at the time of the procedure: Secondary | ICD-10-CM | POA: Diagnosis present

## 2020-05-30 DIAGNOSIS — B9620 Unspecified Escherichia coli [E. coli] as the cause of diseases classified elsewhere: Secondary | ICD-10-CM | POA: Diagnosis present

## 2020-05-30 DIAGNOSIS — Z28310 Unvaccinated for covid-19: Secondary | ICD-10-CM | POA: Diagnosis not present

## 2020-05-30 DIAGNOSIS — K639 Disease of intestine, unspecified: Secondary | ICD-10-CM

## 2020-05-30 DIAGNOSIS — Z1612 Extended spectrum beta lactamase (ESBL) resistance: Secondary | ICD-10-CM | POA: Diagnosis not present

## 2020-05-30 DIAGNOSIS — N3090 Cystitis, unspecified without hematuria: Secondary | ICD-10-CM | POA: Diagnosis not present

## 2020-05-30 DIAGNOSIS — T83030A Leakage of cystostomy catheter, initial encounter: Secondary | ICD-10-CM | POA: Diagnosis present

## 2020-05-30 DIAGNOSIS — Z20822 Contact with and (suspected) exposure to covid-19: Secondary | ICD-10-CM | POA: Diagnosis not present

## 2020-05-30 DIAGNOSIS — Z83438 Family history of other disorder of lipoprotein metabolism and other lipidemia: Secondary | ICD-10-CM

## 2020-05-30 DIAGNOSIS — E038 Other specified hypothyroidism: Secondary | ICD-10-CM

## 2020-05-30 DIAGNOSIS — Z8249 Family history of ischemic heart disease and other diseases of the circulatory system: Secondary | ICD-10-CM

## 2020-05-30 DIAGNOSIS — Z9049 Acquired absence of other specified parts of digestive tract: Secondary | ICD-10-CM

## 2020-05-30 DIAGNOSIS — M1990 Unspecified osteoarthritis, unspecified site: Secondary | ICD-10-CM | POA: Diagnosis present

## 2020-05-30 DIAGNOSIS — Z87440 Personal history of urinary (tract) infections: Secondary | ICD-10-CM

## 2020-05-30 DIAGNOSIS — N3091 Cystitis, unspecified with hematuria: Secondary | ICD-10-CM | POA: Diagnosis present

## 2020-05-30 DIAGNOSIS — Z79891 Long term (current) use of opiate analgesic: Secondary | ICD-10-CM

## 2020-05-30 LAB — URINALYSIS, ROUTINE W REFLEX MICROSCOPIC
Bilirubin Urine: NEGATIVE
Glucose, UA: NEGATIVE mg/dL
Ketones, ur: NEGATIVE mg/dL
Nitrite: POSITIVE — AB
Protein, ur: 100 mg/dL — AB
Specific Gravity, Urine: 1.014 (ref 1.005–1.030)
WBC, UA: >50 WBC/hpf — ABNORMAL HIGH (ref 0–5)
pH: 6 (ref 5.0–8.0)

## 2020-05-30 LAB — CBC WITH DIFFERENTIAL/PLATELET
Abs Immature Granulocytes: 0.06 10*3/uL (ref 0.00–0.07)
Basophils Absolute: 0 10*3/uL (ref 0.0–0.1)
Basophils Relative: 0 %
Eosinophils Absolute: 0.2 10*3/uL (ref 0.0–0.5)
Eosinophils Relative: 2 %
HCT: 31.2 % — ABNORMAL LOW (ref 36.0–46.0)
Hemoglobin: 9.1 g/dL — ABNORMAL LOW (ref 12.0–15.0)
Immature Granulocytes: 1 %
Lymphocytes Relative: 23 %
Lymphs Abs: 2.3 10*3/uL (ref 0.7–4.0)
MCH: 29 pg (ref 26.0–34.0)
MCHC: 29.2 g/dL — ABNORMAL LOW (ref 30.0–36.0)
MCV: 99.4 fL (ref 80.0–100.0)
Monocytes Absolute: 0.8 10*3/uL (ref 0.1–1.0)
Monocytes Relative: 8 %
Neutro Abs: 6.8 10*3/uL (ref 1.7–7.7)
Neutrophils Relative %: 66 %
Platelets: 474 10*3/uL — ABNORMAL HIGH (ref 150–400)
RBC: 3.14 MIL/uL — ABNORMAL LOW (ref 3.87–5.11)
RDW: 14.6 % (ref 11.5–15.5)
WBC: 10.2 10*3/uL (ref 4.0–10.5)
nRBC: 0 % (ref 0.0–0.2)

## 2020-05-30 LAB — RESP PANEL BY RT-PCR (FLU A&B, COVID) ARPGX2
Influenza A by PCR: NEGATIVE
Influenza B by PCR: NEGATIVE
SARS Coronavirus 2 by RT PCR: NEGATIVE

## 2020-05-30 LAB — COMPREHENSIVE METABOLIC PANEL
ALT: 37 U/L (ref 0–44)
AST: 25 U/L (ref 15–41)
Albumin: 3.3 g/dL — ABNORMAL LOW (ref 3.5–5.0)
Alkaline Phosphatase: 101 U/L (ref 38–126)
Anion gap: 12 (ref 5–15)
BUN: 7 mg/dL (ref 6–20)
CO2: 21 mmol/L — ABNORMAL LOW (ref 22–32)
Calcium: 7.5 mg/dL — ABNORMAL LOW (ref 8.9–10.3)
Chloride: 101 mmol/L (ref 98–111)
Creatinine, Ser: 0.95 mg/dL (ref 0.44–1.00)
GFR, Estimated: >60 mL/min (ref >60)
Glucose, Bld: 76 mg/dL (ref 70–99)
Potassium: 2.9 mmol/L — ABNORMAL LOW (ref 3.5–5.1)
Sodium: 134 mmol/L — ABNORMAL LOW (ref 135–145)
Total Bilirubin: 0.4 mg/dL (ref 0.3–1.2)
Total Protein: 6.7 g/dL (ref 6.5–8.1)

## 2020-05-30 LAB — LACTIC ACID, PLASMA
Lactic Acid, Venous: 4.9 mmol/L (ref 0.5–1.9)
Lactic Acid, Venous: 3.5 mmol/L (ref 0.5–1.9)
Lactic Acid, Venous: 2.2 mmol/L (ref 0.5–1.9)
Lactic Acid, Venous: 0.7 mmol/L (ref 0.5–1.9)

## 2020-05-30 LAB — PROTIME-INR
INR: 1.2 (ref 0.8–1.2)
Prothrombin Time: 14.7 seconds (ref 11.4–15.2)

## 2020-05-30 LAB — APTT: aPTT: 37 seconds — ABNORMAL HIGH (ref 24–36)

## 2020-05-30 LAB — POC URINE PREG, ED: Preg Test, Ur: NEGATIVE

## 2020-05-30 MED ORDER — VANCOMYCIN HCL 1250 MG/250ML IV SOLN
1250.0000 mg | Freq: Two times a day (BID) | INTRAVENOUS | Status: DC
  Administered 2020-05-31 – 2020-06-01 (×3): 1250 mg via INTRAVENOUS
  Filled 2020-05-30 (×4): qty 250

## 2020-05-30 MED ORDER — ONDANSETRON HCL 4 MG/2ML IJ SOLN
4.0000 mg | Freq: Four times a day (QID) | INTRAMUSCULAR | Status: DC | PRN
  Administered 2020-05-30 – 2020-06-01 (×5): 4 mg via INTRAVENOUS
  Filled 2020-05-30 (×6): qty 2

## 2020-05-30 MED ORDER — CHLORHEXIDINE GLUCONATE CLOTH 2 % EX PADS
6.0000 | MEDICATED_PAD | Freq: Every day | CUTANEOUS | Status: DC
  Administered 2020-05-30 – 2020-06-05 (×7): 6 via TOPICAL

## 2020-05-30 MED ORDER — HYDROCORTISONE NA SUCCINATE PF 100 MG IJ SOLR
50.0000 mg | Freq: Three times a day (TID) | INTRAMUSCULAR | Status: DC
  Administered 2020-05-30: 50 mg via INTRAVENOUS
  Filled 2020-05-30: qty 2

## 2020-05-30 MED ORDER — SODIUM CHLORIDE 0.9 % IV SOLN
INTRAVENOUS | Status: DC | PRN
  Administered 2020-05-30: 500 mL via INTRAVENOUS

## 2020-05-30 MED ORDER — SODIUM CHLORIDE 0.9 % IV SOLN: 2.0000 g | Freq: Three times a day (TID) | INTRAVENOUS | Status: DC

## 2020-05-30 MED ORDER — ONDANSETRON HCL 4 MG/2ML IJ SOLN
4.0000 mg | Freq: Once | INTRAMUSCULAR | Status: AC
  Administered 2020-05-30: 4 mg via INTRAVENOUS
  Filled 2020-05-30: qty 2

## 2020-05-30 MED ORDER — LACTATED RINGERS IV SOLN: INTRAVENOUS | Status: DC

## 2020-05-30 MED ORDER — SODIUM CHLORIDE 0.9 % IV SOLN: 1.0000 g | Freq: Three times a day (TID) | INTRAVENOUS | Status: DC

## 2020-05-30 MED ORDER — LACTATED RINGERS IV BOLUS (SEPSIS)
1000.0000 mL | Freq: Once | INTRAVENOUS | Status: AC
  Administered 2020-05-30: 1000 mL via INTRAVENOUS

## 2020-05-30 MED ORDER — SODIUM CHLORIDE 0.9 % IV SOLN
2.0000 g | Freq: Once | INTRAVENOUS | Status: AC
  Administered 2020-05-30: 2 g via INTRAVENOUS
  Filled 2020-05-30: qty 2

## 2020-05-30 MED ORDER — POTASSIUM CHLORIDE 10 MEQ/100ML IV SOLN
10.0000 meq | INTRAVENOUS | Status: AC
  Administered 2020-05-30 – 2020-05-31 (×6): 10 meq via INTRAVENOUS
  Filled 2020-05-30 (×6): qty 100

## 2020-05-30 MED ORDER — SODIUM CHLORIDE 0.9 % IV SOLN
1.0000 g | Freq: Three times a day (TID) | INTRAVENOUS | Status: DC
  Administered 2020-05-30 – 2020-06-05 (×17): 1 g via INTRAVENOUS
  Filled 2020-05-30 (×17): qty 1

## 2020-05-30 MED ORDER — MORPHINE SULFATE (PF) 2 MG/ML IV SOLN
1.0000 mg | INTRAVENOUS | Status: DC | PRN
  Administered 2020-05-30: 1 mg via INTRAVENOUS
  Administered 2020-05-31 – 2020-06-01 (×7): 2 mg via INTRAVENOUS
  Administered 2020-06-01: 1 mg via INTRAVENOUS
  Administered 2020-06-01 – 2020-06-02 (×3): 2 mg via INTRAVENOUS
  Filled 2020-05-30 (×12): qty 1

## 2020-05-30 MED ORDER — LACTATED RINGERS IV BOLUS (SEPSIS)
500.0000 mL | Freq: Once | INTRAVENOUS | Status: AC
  Administered 2020-05-30: 500 mL via INTRAVENOUS

## 2020-05-30 MED ORDER — ENOXAPARIN SODIUM 40 MG/0.4ML ~~LOC~~ SOLN
40.0000 mg | SUBCUTANEOUS | Status: DC
  Administered 2020-05-30 – 2020-06-04 (×6): 40 mg via SUBCUTANEOUS
  Filled 2020-05-30 (×6): qty 0.4

## 2020-05-30 MED ORDER — LACTATED RINGERS IV SOLN: INTRAVENOUS | Status: AC

## 2020-05-30 MED ORDER — IOHEXOL 300 MG/ML  SOLN
100.0000 mL | Freq: Once | INTRAMUSCULAR | Status: AC | PRN
  Administered 2020-05-30: 100 mL via INTRAVENOUS

## 2020-05-30 MED ORDER — NOREPINEPHRINE 4 MG/250ML-% IV SOLN
0.0000 ug/min | INTRAVENOUS | Status: DC
  Administered 2020-05-30: 2 ug/min via INTRAVENOUS
  Filled 2020-05-30: qty 250

## 2020-05-30 MED ORDER — VANCOMYCIN HCL 2000 MG/400ML IV SOLN
2000.0000 mg | Freq: Once | INTRAVENOUS | Status: AC
  Administered 2020-05-30: 2000 mg via INTRAVENOUS
  Filled 2020-05-30: qty 400

## 2020-05-30 NOTE — ED Notes (Signed)
md at bedside for central line placement.

## 2020-05-30 NOTE — ED Notes (Signed)
Date and time results received: 05/30/20 1301   Test: Lactic Acid Critical Value: 4.9  Name of Provider Notified: Etta Quill NP   Orders Received? Or Actions Taken?: pending

## 2020-05-30 NOTE — ED Notes (Signed)
Pt c/o nausea, np notified, nausea med given.
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
## 2020-05-30 NOTE — Progress Notes (Addendum)
Pharmacy Antibiotic Note  [name] is a 40 y.o. female admitted on 05/30/2020 with sepsis/ UTI with history of ESBL.  Pharmacy has been consulted for Cefepime dosing.  Plan: Vancomycin 2000 mg IV x 1 dose. Vancomycin 1250 mg IV every 12 hours. Meropenem 1000 mg IV every 8 hours. Monitor labs, c/s, and patient improvement.  Height: 5\' 6"  (167.6 cm) Weight: 113.4 kg (250 lb) IBW/kg (Calculated) : 59.3  Temp (24hrs), Avg:97.8 F (36.6 C), Min:97.5 F (36.4 C), Max:98.4 F (36.9 C)  Recent Labs  Lab 05/30/20 1154 05/30/20 1422  WBC 10.2  --   CREATININE 0.95  --   LATICACIDVEN 4.9* 3.5*    Estimated Creatinine Clearance: 102.5 mL/min (by C-G formula based on SCr of 0.95 mg/dL).    Allergies  Allergen Reactions  . Macrobid [Nitrofurantoin]     Not effective for patient.   . Lisinopril Cough    Antimicrobials this admission: Vanco 3/30 >> Merrem 3/30 >> Cefepime 3/30     Microbiology results: 3/30 BCx: pending 3/30 UCx: pending     Microbiology results: 3/30 BCx: pending 3/30 UCx: pending    Thank you for allowing pharmacy to be a part of this patient's care.  Ramond Craver 05/30/2020 4:24 PM

## 2020-05-30 NOTE — Consult Note (Signed)
WOC Nurse Consult Note: Remote consult completed today.  Reason for Consult:Stage 4 Chronic nonhealing sacral wound.  Admitted with UTI and sepsis.  Was last seen at wound care center 05/14/20 and wound is stable.  Was using anasept, but wound is now clean and wound care center had switched to collagen dressing, which is not on formulary.  We will implement moist wound healing topical wound care. A mattress with low air loss feature and heel offloading boots as patient has history of left lateral malleolus pressure injury that has resolving. History paraplegia.  Wound has been present since 2018. Wound type:stage 4 pressure injury to sacrum.  Pressure Injury POA: Yes Measurement: 2 cm x 2 cm x 3.5 cm per last wound care visit notes.  Wound QMK:JIZX pink, nongranulating Drainage (amount, consistency, odor) moderate serosanguinous  Musty odor.  Periwound:intact  Incontinent of stool.  SP catheter in place Dressing procedure/placement/frequency: Cleanse sacral wound with NS and pat dry.  Apply Aquacel Ag to wound bed.  Fill remaining dead space with fluffed gauze.  Top with silicone foam.  Change dressing every other day.   Will not follow at this time.  Please re-consult if needed.  Domenic Moras MSN, RN, [name]-BC CWON Wound, Ostomy, Continence Nurse Pager [phone]
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
## 2020-05-30 NOTE — Sepsis Progress Note (Signed)
Code Sepsis protocol being followed by elink

## 2020-05-30 NOTE — ED Provider Notes (Signed)
Pt is being admitted for sepsis, possible ischemic bowel.   Asked to place a central line by Dr Waldron Labs.  Linus Salmons Line  Date/Time: 05/30/2020 5:18 PM Performed by: Dorie Rank, MD Authorized by: Dorie Rank, MD   Consent:    Consent obtained:  Verbal and emergent situation   Consent given by:  Patient   Risks, benefits, and alternatives were discussed: yes     Risks discussed:  Arterial puncture, incorrect placement, infection, bleeding and pneumothorax   Alternatives discussed:  No treatment Universal protocol:    Procedure explained and questions answered to patient or proxy's satisfaction: yes     Relevant documents present and verified: yes     Patient identity confirmed:  Verbally with patient Pre-procedure details:    Indication(s): central venous access     Hand hygiene: Hand hygiene performed prior to insertion     Sterile barrier technique: All elements of maximal sterile technique followed     Skin preparation:  Chlorhexidine   Skin preparation agent: Skin preparation agent completely dried prior to procedure   Sedation:    Sedation type:  None Anesthesia:    Anesthesia method:  Local infiltration   Local anesthetic:  Lidocaine 1% w/o epi Procedure details:    Location:  R internal jugular   Patient position:  Supine   Procedural supplies:  Triple lumen   Landmarks identified: yes     Ultrasound guidance: yes     Ultrasound guidance timing: real time     Sterile ultrasound techniques: Sterile gel and sterile probe covers were used     Number of attempts:  1   Successful placement: yes   Post-procedure details:    Post-procedure:  Dressing applied and line sutured   Assessment:  Blood return through all ports, free fluid flow and placement verified by x-ray   Procedure completion:  Tolerated Comments:     CXR ordered for placement      Dorie Rank, MD 05/30/20 1719

## 2020-05-30 NOTE — ED Provider Notes (Signed)
Screening exam began at 12:02 PM  Sepsis orders initiated.  Suspect urine source, purulent drainage from suprapubic ostomy site.   Daleen Bo, MD 05/30/20 [phone]

## 2020-05-30 NOTE — Plan of Care (Signed)
  Problem: Pain Managment: Goal: General experience of comfort will improve Outcome: Progressing   Problem: Skin Integrity: Goal: Risk for impaired skin integrity will decrease Outcome: Progressing   

## 2020-05-30 NOTE — ED Provider Notes (Signed)
  Face-to-face evaluation   History: Patient presents for evaluation of abdominal discomfort and drainage around her suprapubic catheter site which was recently placed.  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  And require suprapubic catheter in place.  She has been following her blood pressure at home and yesterday was normal, today it was low.  She takes blood pressure medications regularly.  She has a wound on her buttocks that has been managed by wound care.  She denies headache or focal weakness.  Physical exam: Alert obese female who is mildly uncomfortable.  No respiratory distress.  Abdomen soft, with minimal tenderness around the suprapubic site.  She has a very large panniculus that drapes over the suprapubic ostomy site.  There is a small amount of purulent drainage, surrounding the catheter exit site.  Medical screening examination/treatment/procedure(s) were conducted as a shared visit with non-physician practitioner(s) and myself.  I personally evaluated the patient during the encounter    Daleen Bo, MD 05/31/20 1538

## 2020-05-30 NOTE — ED Triage Notes (Signed)
Pt presents to ED via [facility] for hypotension. Family unable to obtain BP. EMS palpated 80 BP. Pt alert and oriented x 4.
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
## 2020-05-31 DIAGNOSIS — I10 Essential (primary) hypertension: Secondary | ICD-10-CM | POA: Diagnosis not present

## 2020-05-31 DIAGNOSIS — K219 Gastro-esophageal reflux disease without esophagitis: Secondary | ICD-10-CM | POA: Diagnosis not present

## 2020-05-31 DIAGNOSIS — G8929 Other chronic pain: Secondary | ICD-10-CM

## 2020-05-31 DIAGNOSIS — E038 Other specified hypothyroidism: Secondary | ICD-10-CM | POA: Diagnosis not present

## 2020-05-31 LAB — CBC
HCT: 27.4 % — ABNORMAL LOW (ref 36.0–46.0)
Hemoglobin: 8.4 g/dL — ABNORMAL LOW (ref 12.0–15.0)
MCH: 29.3 pg (ref 26.0–34.0)
MCHC: 30.7 g/dL (ref 30.0–36.0)
MCV: 95.5 fL (ref 80.0–100.0)
Platelets: 376 10*3/uL (ref 150–400)
RBC: 2.87 MIL/uL — ABNORMAL LOW (ref 3.87–5.11)
RDW: 14.4 % (ref 11.5–15.5)
WBC: 10.3 10*3/uL (ref 4.0–10.5)
nRBC: 0 % (ref 0.0–0.2)

## 2020-05-31 LAB — COMPREHENSIVE METABOLIC PANEL
ALT: 28 U/L (ref 0–44)
AST: 19 U/L (ref 15–41)
Albumin: 2.6 g/dL — ABNORMAL LOW (ref 3.5–5.0)
Alkaline Phosphatase: 87 U/L (ref 38–126)
Anion gap: 9 (ref 5–15)
BUN: 6 mg/dL (ref 6–20)
CO2: 25 mmol/L (ref 22–32)
Calcium: 7.6 mg/dL — ABNORMAL LOW (ref 8.9–10.3)
Chloride: 105 mmol/L (ref 98–111)
Creatinine, Ser: 0.47 mg/dL (ref 0.44–1.00)
GFR, Estimated: >60 mL/min (ref >60)
Glucose, Bld: 89 mg/dL (ref 70–99)
Potassium: 3.5 mmol/L (ref 3.5–5.1)
Sodium: 139 mmol/L (ref 135–145)
Total Bilirubin: 0.5 mg/dL (ref 0.3–1.2)
Total Protein: 5.7 g/dL — ABNORMAL LOW (ref 6.5–8.1)

## 2020-05-31 LAB — PROCALCITONIN: Procalcitonin: <0.1 ng/mL

## 2020-05-31 LAB — PROTIME-INR
INR: 1.2 (ref 0.8–1.2)
Prothrombin Time: 15.1 seconds (ref 11.4–15.2)

## 2020-05-31 LAB — MRSA PCR SCREENING: MRSA by PCR: NEGATIVE

## 2020-05-31 MED ORDER — PANTOPRAZOLE SODIUM 40 MG IV SOLR
40.0000 mg | INTRAVENOUS | Status: DC
  Administered 2020-05-31 – 2020-06-01 (×2): 40 mg via INTRAVENOUS
  Filled 2020-05-31 (×2): qty 40

## 2020-05-31 NOTE — Progress Notes (Signed)
Correction GCS 15
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
## 2020-05-31 NOTE — Progress Notes (Addendum)
[facility]   Patient's abdominal exam is benign, soft and no rebound or guarding. Says she has some nonspecific pain. Came off levophed last night at 8pm.  WBC wnl.  Lactic wnl.   BP 115/64   Pulse 89   Temp 99.7 F (37.6 C) (Oral)   Resp 17   Ht 5\' 6"  (1.676 m)   Wt 116.8 kg   LMP 05/01/2020   SpO2 98%   BMI 41.56 kg/m   Will keep NPO today and will discuss repeat imaging with radiology and timing.   Curlene Labrum, MD [facility] [address] [place],  [address] [phone] (office)

## 2020-05-31 NOTE — Progress Notes (Addendum)
Subjective: Patient states her abdominal pain is about the same as yesterday evening.  It has not worsened.  No bowel movement or flatus yet.  No nausea this morning.  Objective: Vital signs in last 24 hours: Temp:  [97.5 F (36.4 C)-99.9 F (37.7 C)] 98.9 F (37.2 C) (03/31 0400) Pulse Rate:  [70-106] 88 (03/31 0600) Resp:  [10-23] 16 (03/31 0600) BP: (72-136)/(43-97) 136/67 (03/31 0600) SpO2:  [96 %-100 %] 96 % (03/31 0600) Weight:  [113.4 kg-116.8 kg] 116.8 kg (03/30 1900) Last BM Date: 05/30/20  Intake/Output from previous day: 03/30 0701 - 03/31 0700 In: 6872.1 [I.V.:2000.6; IV Piggyback:4871.5] Out: 6269 [Urine:3325] Intake/Output this shift: No intake/output data recorded.  General appearance: alert, cooperative and no distress GI: Soft with migratory discomfort to palpation, now in the upper abdomen.  No rigidity is noted.  Occasional bowel sounds appreciated.  Lab Results:  Recent Labs    05/30/20 1154 05/31/20 0609  WBC 10.2 10.3  HGB 9.1* 8.4*  HCT 31.2* 27.4*  PLT 474* 376   BMET Recent Labs    05/30/20 1154 05/31/20 0609  NA 134* 139  K 2.9* 3.5  CL 101 105  CO2 21* 25  GLUCOSE 76 89  BUN 7 6  CREATININE 0.95 0.47  CALCIUM 7.5* 7.6*   PT/INR Recent Labs    05/30/20 1154 05/31/20 0609  LABPROT 14.7 15.1  INR 1.2 1.2    Studies/Results: CT Abdomen Pelvis W Contrast  Result Date: 05/30/2020 CLINICAL DATA:  Hypotension. "Wound to buttock". History of cholecystectomy. Appendectomy. Bilateral ovarian cyst. Elevated lactase. EXAM: CT ABDOMEN AND PELVIS WITH CONTRAST TECHNIQUE: Multidetector CT imaging of the abdomen and pelvis was performed using the standard protocol following bolus administration of intravenous contrast. CONTRAST:  150mL OMNIPAQUE IOHEXOL 300 MG/ML  SOLN COMPARISON:  12/27/2019 FINDINGS: Lower chest: Clear lung bases. Mild cardiomegaly, without pericardial or pleural effusion. Tiny hiatal hernia. Hepatobiliary: Mild  degradation secondary to artifacts including EKG wires and leads, spinal stimulator pump. No focal liver lesion. Portal venous gas is identified bilaterally, including on 20/3/2 and extensively throughout the caudate lobe on 28/2. Cholecystectomy, without biliary ductal dilatation. Pancreas: Normal, without mass or ductal dilatation. Spleen: Normal in size, without focal abnormality. Adrenals/Urinary Tract: Normal adrenal glands. Normal kidneys, without hydronephrosis. A suprapubic catheter within the bladder. The bladder appears thick walled with mild surrounding edema, but is decompressed including on 89/2. Stomach/Bowel: Normal stomach, without wall thickening. Colonic stool burden suggests constipation. The cecum is mobile , positioned within the left upper abdomen. Normal small bowel caliber. Although there is no convincing evidence of pneumatosis, there is edema and extraluminal gas within the ileocolic mesentery, including on images 57 and 64 of series 2 respectively. Mesenteric gas also identified on image 37/2. Vascular/Lymphatic: Aortic atherosclerosis. Portal venous gas as detailed above. No abdominal adenopathy. Upper normal right external iliac node is likely reactive at 9 mm. Reproductive: Normal uterus and adnexa. Other: No significant free fluid. Musculoskeletal: Subcutaneous thickening about the left flank on 33/2 similar, likely scarring. Superficial to the coccygeal tip is subcutaneous thickening and gas including on 79/2. Measures on the order of 5.9 x 3.8 cm today versus 4.9 x 3.7 cm on the prior exam. Suspect underlying osteomyelitis, as evidenced by absence of a portion of the distal coccyx on sagittal image 84. L1 compression deformity with trans pedicle screw fixation at T11 through L3. Similar ventral canal encroachment at L1. IMPRESSION: 1. Findings most consistent with ischemic bowel, including portal venous and mesenteric gas.  The affected bowel is either distal small bowel or  right-sided colon, given distribution of mesenteric gas. No well-defined pneumatosis identified. 2. Pre coccygeal decubitus ulcer with suspicion of chronic osteomyelitis. 3.  Possible constipation. 4.  Aortic Atherosclerosis (ICD10-I70.0). 5. Mild limitations, as detailed above. 6. Suprapubic bladder catheter with possible wall thickening and pericystic edema. Correlate with urinalysis to exclude cystitis. These results were called by telephone at the time of interpretation on 05/30/2020 at 3:50 pm to provider Shanon Brow, NP , who verbally acknowledged these results. Electronically Signed   By: Abigail Miyamoto M.D.   On: 05/30/2020 15:51   DG Chest Portable 1 View  Result Date: 05/30/2020 CLINICAL DATA:  Central line placement EXAM: PORTABLE CHEST 1 VIEW COMPARISON:  05/30/2020 at 12:14 p.m. FINDINGS: Single frontal view of the chest demonstrates right internal jugular catheter tip overlying the brachiocephalic confluence. Cardiac silhouette is unremarkable. No airspace disease, effusion, or pneumothorax. No acute bony abnormalities. Stable postsurgical changes at the thoracolumbar junction. IMPRESSION: 1. No complication after right internal jugular catheter placement. Electronically Signed   By: Randa Ngo M.D.   On: 05/30/2020 17:43   DG Chest Port 1 View  Result Date: 05/30/2020 CLINICAL DATA:  Hypotension, sepsis. EXAM: PORTABLE CHEST 1 VIEW COMPARISON:  04/18/2020 and CT chest 04/18/2020. FINDINGS: Trachea is midline. Heart size stable. Lungs are clear. No pleural fluid. IMPRESSION: No acute findings. Electronically Signed   By: Lorin Picket M.D.   On: 05/30/2020 12:45    Anti-infectives: Anti-infectives (From admission, onward)   Start     Dose/Rate Route Frequency Ordered Stop   05/31/20 0700  vancomycin (VANCOREADY) IVPB 1250 mg/250 mL        1,250 mg 166.7 mL/hr over 90 Minutes Intravenous Every 12 hours 05/30/20 1732     05/30/20 2200  ceFEPIme (MAXIPIME) 2 g in sodium chloride 0.9 % 100 mL  IVPB  Status:  Discontinued        2 g 200 mL/hr over 30 Minutes Intravenous Every 8 hours 05/30/20 1621 05/30/20 1726   05/30/20 2000  meropenem (MERREM) 1 g in sodium chloride 0.9 % 100 mL IVPB        1 g 200 mL/hr over 30 Minutes Intravenous Every 8 hours 05/30/20 1946     05/30/20 1800  vancomycin (VANCOREADY) IVPB 2000 mg/400 mL        2,000 mg 200 mL/hr over 120 Minutes Intravenous  Once 05/30/20 1728 05/30/20 2248   05/30/20 1730  meropenem (MERREM) 1 g in sodium chloride 0.9 % 100 mL IVPB  Status:  Discontinued        1 g 200 mL/hr over 30 Minutes Intravenous Every 8 hours 05/30/20 1726 05/30/20 1946   05/30/20 1245  ceFEPIme (MAXIPIME) 2 g in sodium chloride 0.9 % 100 mL IVPB        2 g 200 mL/hr over 30 Minutes Intravenous  Once 05/30/20 1230 05/30/20 1339      Assessment/Plan: Impression: Patient's abdominal examination has not worsened today.  She does not have an acute surgical abdomen at this time.  Her labs were all within normal limits.  Her lactic acid level has normalized.  Her blood pressure is stable.  She is being treated for sepsis secondary to UTI.  No need for acute surgical intervention.  Continue current management.  We will follow her closely with you.  LOS: 1 day    Aviva Signs 05/31/2020

## 2020-06-01 ENCOUNTER — Inpatient Hospital Stay (HOSPITAL_COMMUNITY): Payer: Medicaid Other

## 2020-06-01 ENCOUNTER — Inpatient Hospital Stay (HOSPITAL_COMMUNITY): Payer: Medicaid Other | Admitting: Anesthesiology

## 2020-06-01 ENCOUNTER — Encounter (HOSPITAL_COMMUNITY): Payer: Self-pay | Admitting: Internal Medicine

## 2020-06-01 ENCOUNTER — Encounter (HOSPITAL_COMMUNITY)
Admission: EM | Disposition: A | Discharge status: Home / Self Care | Payer: Self-pay | Source: Home / Self Care | Attending: Internal Medicine

## 2020-06-01 DIAGNOSIS — K219 Gastro-esophageal reflux disease without esophagitis: Secondary | ICD-10-CM | POA: Diagnosis not present

## 2020-06-01 DIAGNOSIS — E038 Other specified hypothyroidism: Secondary | ICD-10-CM | POA: Diagnosis not present

## 2020-06-01 DIAGNOSIS — I10 Essential (primary) hypertension: Secondary | ICD-10-CM | POA: Diagnosis not present

## 2020-06-01 DIAGNOSIS — G8929 Other chronic pain: Secondary | ICD-10-CM | POA: Diagnosis not present

## 2020-06-01 DIAGNOSIS — R1030 Lower abdominal pain, unspecified: Secondary | ICD-10-CM | POA: Diagnosis not present

## 2020-06-01 DIAGNOSIS — K559 Vascular disorder of intestine, unspecified: Secondary | ICD-10-CM

## 2020-06-01 DIAGNOSIS — K639 Disease of intestine, unspecified: Secondary | ICD-10-CM | POA: Diagnosis not present

## 2020-06-01 HISTORY — PX: LAPAROTOMY: SHX154

## 2020-06-01 LAB — CREATININE, SERUM
Creatinine, Ser: 0.36 mg/dL — ABNORMAL LOW (ref 0.44–1.00)
GFR, Estimated: >60 mL/min (ref >60)

## 2020-06-01 LAB — PREPARE RBC (CROSSMATCH)

## 2020-06-01 SURGERY — LAPAROTOMY, EXPLORATORY
Anesthesia: General

## 2020-06-01 MED ORDER — METOCLOPRAMIDE HCL 5 MG/ML IJ SOLN
INTRAMUSCULAR | Status: AC
  Filled 2020-06-01: qty 2

## 2020-06-01 MED ORDER — SCOPOLAMINE 1 MG/3DAYS TD PT72
1.0000 | MEDICATED_PATCH | Freq: Once | TRANSDERMAL | Status: DC
  Administered 2020-06-01: 1.5 mg via TRANSDERMAL

## 2020-06-01 MED ORDER — SCOPOLAMINE 1 MG/3DAYS TD PT72
MEDICATED_PATCH | TRANSDERMAL | Status: AC
  Filled 2020-06-01: qty 1

## 2020-06-01 MED ORDER — DEXAMETHASONE SODIUM PHOSPHATE 10 MG/ML IJ SOLN
INTRAMUSCULAR | Status: DC | PRN
  Administered 2020-06-01: 10 mg via INTRAVENOUS

## 2020-06-01 MED ORDER — METOCLOPRAMIDE HCL 5 MG/ML IJ SOLN
INTRAMUSCULAR | Status: DC | PRN
  Administered 2020-06-01: 10 mg via INTRAVENOUS

## 2020-06-01 MED ORDER — ORAL CARE MOUTH RINSE: 15.0000 mL | Freq: Once | OROMUCOSAL | Status: AC

## 2020-06-01 MED ORDER — BUPIVACAINE LIPOSOME 1.3 % IJ SUSP
INTRAMUSCULAR | Status: AC
  Filled 2020-06-01: qty 20

## 2020-06-01 MED ORDER — CHLORHEXIDINE GLUCONATE CLOTH 2 % EX PADS
6.0000 | MEDICATED_PAD | Freq: Once | CUTANEOUS | Status: DC
  Administered 2020-06-01: 6 via TOPICAL

## 2020-06-01 MED ORDER — FENTANYL CITRATE (PF) 100 MCG/2ML IJ SOLN
INTRAMUSCULAR | Status: DC | PRN
  Administered 2020-06-01: 150 ug via INTRAVENOUS
  Administered 2020-06-01 (×2): 50 ug via INTRAVENOUS
  Administered 2020-06-01: 100 ug via INTRAVENOUS

## 2020-06-01 MED ORDER — CHLORHEXIDINE GLUCONATE 0.12 % MT SOLN
15.0000 mL | Freq: Once | OROMUCOSAL | Status: AC
  Administered 2020-06-01: 15 mL via OROMUCOSAL

## 2020-06-01 MED ORDER — PROPOFOL 10 MG/ML IV BOLUS
INTRAVENOUS | Status: DC | PRN
  Administered 2020-06-01: 150 mg via INTRAVENOUS

## 2020-06-01 MED ORDER — HYDROMORPHONE HCL 1 MG/ML IJ SOLN
INTRAMUSCULAR | Status: AC
  Filled 2020-06-01: qty 0.5

## 2020-06-01 MED ORDER — SODIUM CHLORIDE 0.9 % IR SOLN
Status: DC | PRN
  Administered 2020-06-01 (×2): 1000 mL

## 2020-06-01 MED ORDER — ONDANSETRON HCL 4 MG/2ML IJ SOLN: 4.0000 mg | Freq: Once | INTRAMUSCULAR | Status: DC | PRN

## 2020-06-01 MED ORDER — ROCURONIUM BROMIDE 100 MG/10ML IV SOLN
INTRAVENOUS | Status: DC | PRN
  Administered 2020-06-01: 70 mg via INTRAVENOUS

## 2020-06-01 MED ORDER — MIDAZOLAM HCL 2 MG/2ML IJ SOLN
INTRAMUSCULAR | Status: AC
  Filled 2020-06-01: qty 2

## 2020-06-01 MED ORDER — KETAMINE HCL 10 MG/ML IJ SOLN
INTRAMUSCULAR | Status: DC | PRN
Start: 2020-06-01 — End: 2020-06-01
  Administered 2020-06-01: 30 mg via INTRAVENOUS

## 2020-06-01 MED ORDER — IOHEXOL 300 MG/ML  SOLN
100.0000 mL | Freq: Once | INTRAMUSCULAR | Status: AC | PRN
  Administered 2020-06-01: 100 mL via INTRAVENOUS

## 2020-06-01 MED ORDER — IOHEXOL 9 MG/ML PO SOLN
ORAL | Status: AC
  Filled 2020-06-01: qty 1000

## 2020-06-01 MED ORDER — MEPERIDINE HCL 50 MG/ML IJ SOLN: 6.2500 mg | INTRAMUSCULAR | Status: DC | PRN

## 2020-06-01 MED ORDER — SUGAMMADEX SODIUM 200 MG/2ML IV SOLN
INTRAVENOUS | Status: DC | PRN
Start: 2020-06-01 — End: 2020-06-01
  Administered 2020-06-01: 300 mg via INTRAVENOUS

## 2020-06-01 MED ORDER — MIDAZOLAM HCL 5 MG/5ML IJ SOLN
INTRAMUSCULAR | Status: DC | PRN
  Administered 2020-06-01: 2 mg via INTRAVENOUS

## 2020-06-01 MED ORDER — LIDOCAINE HCL (CARDIAC) PF 100 MG/5ML IV SOSY
PREFILLED_SYRINGE | INTRAVENOUS | Status: DC | PRN
Start: 2020-06-01 — End: 2020-06-01
  Administered 2020-06-01: 100 mg via INTRATRACHEAL

## 2020-06-01 MED ORDER — BUPIVACAINE LIPOSOME 1.3 % IJ SUSP
INTRAMUSCULAR | Status: DC | PRN
  Administered 2020-06-01: 20 mL

## 2020-06-01 MED ORDER — PHENYLEPHRINE 40 MCG/ML (10ML) SYRINGE FOR IV PUSH (FOR BLOOD PRESSURE SUPPORT)
PREFILLED_SYRINGE | INTRAVENOUS | Status: DC | PRN
  Administered 2020-06-01 (×2): 160 ug via INTRAVENOUS

## 2020-06-01 MED ORDER — FENTANYL CITRATE (PF) 100 MCG/2ML IJ SOLN
INTRAMUSCULAR | Status: AC
  Filled 2020-06-01: qty 2

## 2020-06-01 MED ORDER — LACTATED RINGERS IV SOLN
INTRAVENOUS | Status: DC
  Administered 2020-06-01: 1000 mL via INTRAVENOUS

## 2020-06-01 MED ORDER — FENTANYL CITRATE (PF) 250 MCG/5ML IJ SOLN
INTRAMUSCULAR | Status: AC
  Filled 2020-06-01: qty 5

## 2020-06-01 MED ORDER — SODIUM CHLORIDE 0.9 % IV SOLN
2.0000 g | INTRAVENOUS | Status: AC
  Administered 2020-06-01: 2 g via INTRAVENOUS
  Filled 2020-06-01: qty 2

## 2020-06-01 MED ORDER — HYDROMORPHONE HCL 1 MG/ML IJ SOLN
0.2500 mg | INTRAMUSCULAR | Status: DC | PRN
  Administered 2020-06-01 (×3): 0.5 mg via INTRAVENOUS
  Filled 2020-06-01 (×2): qty 0.5

## 2020-06-01 MED ORDER — KETAMINE HCL 50 MG/5ML IJ SOSY
PREFILLED_SYRINGE | INTRAMUSCULAR | Status: AC
  Filled 2020-06-01: qty 5

## 2020-06-01 SURGICAL SUPPLY — 62 items
APL PRP STRL LF DISP 70% ISPRP (MISCELLANEOUS) ×1
APPLIER CLIP 11 MED OPEN (CLIP)
APPLIER CLIP 13 LRG OPEN (CLIP)
APR CLP LRG 13 20 CLIP (CLIP)
APR CLP MED 11 20 MLT OPN (CLIP)
BARRIER SKIN 2 3/4 (OSTOMY) IMPLANT
BRR SKN FLT 2.75X2.25 2 PC (OSTOMY)
CELLS DAT CNTRL 66122 CELL SVR (MISCELLANEOUS) IMPLANT
CHLORAPREP W/TINT 26 (MISCELLANEOUS) ×2 IMPLANT
CLAMP POUCH DRAINAGE QUIET (OSTOMY) IMPLANT
CLIP APPLIE 11 MED OPEN (CLIP) IMPLANT
CLIP APPLIE 13 LRG OPEN (CLIP) IMPLANT
CLOTH BEACON ORANGE TIMEOUT ST (SAFETY) ×2 IMPLANT
COVER LIGHT HANDLE STERIS (MISCELLANEOUS) ×4 IMPLANT
COVER WAND RF STERILE (DRAPES) ×2 IMPLANT
DRAPE WARM FLUID 44X44 (DRAPES) ×2 IMPLANT
DRSG OPSITE POSTOP 4X10 (GAUZE/BANDAGES/DRESSINGS) IMPLANT
DRSG OPSITE POSTOP 4X8 (GAUZE/BANDAGES/DRESSINGS) ×2 IMPLANT
ELECT BLADE 6 FLAT ULTRCLN (ELECTRODE) IMPLANT
ELECT REM PT RETURN 9FT ADLT (ELECTROSURGICAL) ×2
ELECTRODE REM PT RTRN 9FT ADLT (ELECTROSURGICAL) ×1 IMPLANT
GLOVE SURG ENC MOIS LTX SZ6.5 (GLOVE) ×2 IMPLANT
GLOVE SURG SS PI 7.5 STRL IVOR (GLOVE) ×2 IMPLANT
GLOVE SURG UNDER POLY LF SZ6.5 (GLOVE) ×2 IMPLANT
GLOVE SURG UNDER POLY LF SZ7 (GLOVE) ×8 IMPLANT
GOWN STRL REUS W/TWL LRG LVL3 (GOWN DISPOSABLE) ×6 IMPLANT
HANDLE SUCTION POOLE (INSTRUMENTS) ×1 IMPLANT
INST SET MAJOR GENERAL (KITS) ×2 IMPLANT
KIT REMOVER STAPLE SKIN (MISCELLANEOUS) IMPLANT
KIT TURNOVER KIT A (KITS) ×2 IMPLANT
LIGASURE IMPACT 36 18CM CVD LR (INSTRUMENTS) IMPLANT
MANIFOLD NEPTUNE II (INSTRUMENTS) ×2 IMPLANT
NEEDLE HYPO 18GX1.5 BLUNT FILL (NEEDLE) ×2 IMPLANT
NEEDLE HYPO 21X1.5 SAFETY (NEEDLE) ×2 IMPLANT
NS IRRIG 1000ML POUR BTL (IV SOLUTION) ×4 IMPLANT
PACK MAJOR ABDOMINAL (CUSTOM PROCEDURE TRAY) ×2 IMPLANT
PAD ARMBOARD 7.5X6 YLW CONV (MISCELLANEOUS) ×2 IMPLANT
PENCIL SMOKE EVACUATOR COATED (MISCELLANEOUS) ×2 IMPLANT
POUCH OSTOMY 2 3/4  H 3804 (WOUND CARE)
POUCH OSTOMY 2 3/4 H 3804 (WOUND CARE)
POUCH OSTOMY 2 PC DRNBL 2.75 (WOUND CARE) IMPLANT
RELOAD LINEAR CUT PROX 55 BLUE (ENDOMECHANICALS) IMPLANT
RELOAD PROXIMATE 75MM BLUE (ENDOMECHANICALS) IMPLANT
RETRACTOR WND ALEXIS 25 LRG (MISCELLANEOUS) IMPLANT
RTRCTR WOUND ALEXIS 18CM MED (MISCELLANEOUS)
RTRCTR WOUND ALEXIS 25CM LRG (MISCELLANEOUS)
SET BASIN LINEN APH (SET/KITS/TRAYS/PACK) ×2 IMPLANT
SPONGE LAP 18X18 RF (DISPOSABLE) ×2 IMPLANT
STAPLER GUN LINEAR PROX 60 (STAPLE) IMPLANT
STAPLER PROXIMATE 55 BLUE (STAPLE) IMPLANT
STAPLER PROXIMATE 75MM BLUE (STAPLE) IMPLANT
STAPLER VISISTAT (STAPLE) ×2 IMPLANT
SUCTION POOLE HANDLE (INSTRUMENTS) ×2
SUT CHROMIC 0 SH (SUTURE) IMPLANT
SUT CHROMIC 2 0 SH (SUTURE) IMPLANT
SUT CHROMIC 3 0 SH 27 (SUTURE) IMPLANT
SUT PDS AB CT VIOLET #0 27IN (SUTURE) ×4 IMPLANT
SUT PROLENE 2 0 SH 30 (SUTURE) IMPLANT
SUT SILK 3 0 SH CR/8 (SUTURE) ×2 IMPLANT
SUT VIC AB 3-0 SH 27 (SUTURE) ×2
SUT VIC AB 3-0 SH 27X BRD (SUTURE) ×1 IMPLANT
SYR 20ML LL LF (SYRINGE) ×4 IMPLANT

## 2020-06-01 NOTE — Anesthesia Procedure Notes (Signed)
Procedure Name: Intubation Date/Time: 06/01/2020 3:32 PM Performed by: Hewitt Blade, CRNA Pre-anesthesia Checklist: Patient identified, Emergency Drugs available, Suction available and Patient being monitored Patient Re-evaluated:Patient Re-evaluated prior to induction Oxygen Delivery Method: Circle system utilized Preoxygenation: Pre-oxygenation with 100% oxygen Induction Type: IV induction, Cricoid Pressure applied and Rapid sequence Laryngoscope Size: Mac and 3 Grade View: Grade I Tube type: Oral Tube size: 7.0 mm Number of attempts: 1 Airway Equipment and Method: Stylet and Oral airway Placement Confirmation: ETT inserted through vocal cords under direct vision,  positive ETCO2 and breath sounds checked- equal and bilateral Secured at: 21 cm Tube secured with: Tape Dental Injury: Teeth and Oropharynx as per pre-operative assessment
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
## 2020-06-01 NOTE — Progress Notes (Signed)
[facility]  Updated mother. Everything negative on her exploration. Can have clear diet. Would continue treating UTI.  Mother asking about changing out suprapubic tubing and getting supplies for that for home. Unsure but will ask Dr. Dyann Kief.   Curlene Labrum, MD [facility] [address] [place], [place] [address] [phone] (office)

## 2020-06-01 NOTE — Anesthesia Preprocedure Evaluation (Addendum)
Anesthesia Evaluation  Patient identified by MRN, date of birth, ID band Patient awake    Reviewed: Allergy & Precautions, NPO status , Patient's Chart, lab work & pertinent test results  History of Anesthesia Complications (+) PONV and history of anesthetic complications  Airway Mallampati: II  TM Distance: >3 FB Neck ROM: Full    Dental  (+) Edentulous Upper, Edentulous Lower   Pulmonary Oxygen sleep apnea neg sleep apnea, pneumonia,  oxygen dependent, Patient abstained from smoking., former smoker,    Pulmonary exam normal breath sounds clear to auscultation       Cardiovascular Exercise Tolerance: Poor METS (paraplegic): hypertension, Pt. on medications Normal cardiovascular exam Rhythm:Regular Rate:Normal  30-May-2020 11:56:21 [facility]-ER ROUTINE RECORD 29-FAO-1308 (8 yr) Female Caucasian Room:ER9 Loc:499 Technician: Test [id number] Comment 1:: Comment 2:: Comment 3:: Comment 4:: Vent. rate 84 BPM PR interval 132 ms QRS duration 85 ms QT/QTcB 418/495 ms P-R-T axes 67 58 13 Sinus rhythm Abnormal R-wave progression, early transition Borderline prolonged QT interval Since last tracing QT has lengthened Otherwise no significant change Confirmed by Daleen Bo [phone]) on 05/30/2020 1:16:44 PM   Neuro/Psych PSYCHIATRIC DISORDERS Anxiety Depression  Neuromuscular disease    GI/Hepatic Neg liver ROS, GERD  Medicated and Controlled,  Endo/Other  Hypothyroidism   Renal/GU negative Renal ROS     Musculoskeletal  (+) Arthritis , Osteoarthritis,    Abdominal   Peds  Hematology  (+) anemia ,   Anesthesia Other Findings   Reproductive/Obstetrics                            Anesthesia Physical Anesthesia Plan  ASA: IV and emergent  Anesthesia Plan: General   Post-op Pain Management:    Induction: Intravenous, Rapid sequence and Cricoid pressure  planned  PONV Risk Score and Plan: 4 or greater and Ondansetron, Dexamethasone, Midazolam and Scopolamine patch - Pre-op  Airway Management Planned: Oral ETT  Additional Equipment:   Intra-op Plan:   Post-operative Plan: Extubation in OR and Possible Post-op intubation/ventilation  Informed Consent: I have reviewed the patients History and Physical, chart, labs and discussed the procedure including the risks, benefits and alternatives for the proposed anesthesia with the patient or authorized representative who has indicated his/her understanding and acceptance.     Dental advisory given  Plan Discussed with: CRNA and Surgeon  Anesthesia Plan Comments:        Anesthesia Quick Evaluation

## 2020-06-01 NOTE — Op Note (Signed)
[facility] Operative Note  06/01/20  Preoperative Diagnosis:  Colonic thickening on CT, possible Colon ischemia?    Postoperative Diagnosis: Normal bowel    Procedure(s) Performed:  Exploratory laparotomy    Surgeon: Lanell Matar. Constance Haw, MD   Assistants:    Anesthesia: General endotracheal   Anesthesiologist: Denese Killings, MD    Specimens: None    Estimated Blood Loss: Minimal   Blood Replacement: None    Complications: None    Wound Class: Clean    Operative Indications: [name] is a 40 yo who had portal and mesenteric gas of unknown etiology 2 days prior and a repeat CT done today to demonstrated improvement. On that CT with oral and IV contrast there was concern for very thickened cecum and ascending colon to the transverse colon and concern for possible ischemic bowel.  She was clinically well with no abdominal pain, normal labs, and hemodynamics.  We discussed exploration to rule out ischemic bowel and risk of bleeding, infection, injury to other organs, need for colectomy versus colostomy.   Findings: Normal cecum, ascending, transfers, descending and sigmoid colon, normal small bowel    Procedure: The patient was taken to the operating room and placed supine. General endotracheal anesthesia was induced. Intravenous antibiotics were administered per protocol.  An orogastric tube positioned to decompress the stomach. The abdomen was prepared and draped in the usual sterile fashion.   An upper midline incision was made and carried down through to the fascia with electrocautery. I used scissors to enter the abdomen.  The cecum was noted as well as her appendectomy staple line and the colon was examined from the cecum to the sigmoid colon. The small bowel was ran and both the small bowel and colon were normal without thickening or signs of any ischemia. The mesentery looked normal.  There was minimal peritoneal fluid in the abdomen and no signs of  any prior perforation.   We examined the cecum and ascending colon three times to ensure that it was fully examined. I notified radiology of my findings.   The abdomen was closed with 0 PDS in the standard fashion. The subcutaneous layer was closed with 3-0 interrupted Vicryl and Exparel was placed. Staples closed the skin.   A honeycomb dressing was placed.   Final inspection revealed acceptable hemostasis. All counts were correct at the end of the case. The patient was awakened from anesthesia and extubated without complication.  The patient went to the PACU in stable condition.   Curlene Labrum, MD [facility] [address] [place], [place] [address] [phone] (office)

## 2020-06-01 NOTE — Anesthesia Postprocedure Evaluation (Signed)
Anesthesia Post Note  Patient: [name]  Procedure(s) Performed: EXPLORATORY LAPAROTOMY (N/A )  Patient location during evaluation: ICU Anesthesia Type: General Level of consciousness: awake and alert and oriented Pain management: pain level controlled Vital Signs Assessment: post-procedure vital signs reviewed and stable Respiratory status: spontaneous breathing and respiratory function stable Cardiovascular status: blood pressure returned to baseline and stable Postop Assessment: no apparent nausea or vomiting Anesthetic complications: no   No complications documented.   Last Vitals:  Vitals:   06/01/20 1658 06/01/20 1700  BP:  132/66  Pulse:  80  Resp: (!) 8 16  Temp:    SpO2: 94% 97%    Last Pain:  Vitals:   06/01/20 1708  TempSrc:   PainSc: Asleep                 Ezekiah Massie C Runa Whittingham

## 2020-06-01 NOTE — Progress Notes (Signed)
[facility]  Patient's CT with thickened right colon, I was thinking more colitis but radiology is worried it is ischemic given the thickness and may have necrotic colon. No free air, no pneumatosis, no portal gas or mesenteric gas. Patient completely stable and no pain reported. Wants to eat. Discussed needing to looking into her abdomen to rule out ischemic bowel versus colitis. Discussed possible colectomy and anastomosis versus ostomy. Discussed risk of bleeding, infection, anastomotic leak, finding dead bowel.   She and her mother are both in agreement and understand.    Updated RN and Dr. Dyann Kief. Posted case.    Curlene Labrum, MD [facility] [address] [place], [place] [address] [phone] (office)

## 2020-06-01 NOTE — Progress Notes (Signed)
[facility] Progress Note     Subjective: Says abdominal pain is better. Says she had BM. Said only minor nausea.   Objective: Vital signs in last 24 hours: Temp:  [97.9 F (36.6 C)-99.8 F (37.7 C)] 98.4 F (36.9 C) (04/01 0745) Pulse Rate:  [79-104] 86 (04/01 0600) Resp:  [8-20] 11 (04/01 0600) BP: (117-154)/(65-107) 126/86 (04/01 0600) SpO2:  [95 %-100 %] 99 % (04/01 0600) Last BM Date: 05/21/20  Intake/Output from previous day: 03/31 0701 - 04/01 0700 In: 2864.6 [I.V.:2197.2; IV Piggyback:667.4] Out: 2600 [Urine:2600] Intake/Output this shift: Total I/O In: -  Out: 1750 [Urine:1750]  General appearance: alert, cooperative and no distress GI: soft, nondistended, nontender  Lab Results:  Recent Labs    05/30/20 1154 05/31/20 0609  WBC 10.2 10.3  HGB 9.1* 8.4*  HCT 31.2* 27.4*  PLT 474* 376   BMET Recent Labs    05/30/20 1154 05/31/20 0609 06/01/20 0354  NA 134* 139  --   K 2.9* 3.5  --   CL 101 105  --   CO2 21* 25  --   GLUCOSE 76 89  --   BUN 7 6  --   CREATININE 0.95 0.47 0.36*  CALCIUM 7.5* 7.6*  --    PT/INR Recent Labs    05/30/20 1154 05/31/20 0609  LABPROT 14.7 15.1  INR 1.2 1.2    Studies/Results: CT Abdomen Pelvis W Contrast  Result Date: 05/30/2020 CLINICAL DATA:  Hypotension. "Wound to buttock". History of cholecystectomy. Appendectomy. Bilateral ovarian cyst. Elevated lactase. EXAM: CT ABDOMEN AND PELVIS WITH CONTRAST TECHNIQUE: Multidetector CT imaging of the abdomen and pelvis was performed using the standard protocol following bolus administration of intravenous contrast. CONTRAST:  157mL OMNIPAQUE IOHEXOL 300 MG/ML  SOLN COMPARISON:  12/27/2019 FINDINGS: Lower chest: Clear lung bases. Mild cardiomegaly, without pericardial or pleural effusion. Tiny hiatal hernia. Hepatobiliary: Mild degradation secondary to artifacts including EKG wires and leads, spinal stimulator pump. No focal liver lesion. Portal venous gas  is identified bilaterally, including on 20/3/2 and extensively throughout the caudate lobe on 28/2. Cholecystectomy, without biliary ductal dilatation. Pancreas: Normal, without mass or ductal dilatation. Spleen: Normal in size, without focal abnormality. Adrenals/Urinary Tract: Normal adrenal glands. Normal kidneys, without hydronephrosis. A suprapubic catheter within the bladder. The bladder appears thick walled with mild surrounding edema, but is decompressed including on 89/2. Stomach/Bowel: Normal stomach, without wall thickening. Colonic stool burden suggests constipation. The cecum is mobile , positioned within the left upper abdomen. Normal small bowel caliber. Although there is no convincing evidence of pneumatosis, there is edema and extraluminal gas within the ileocolic mesentery, including on images 57 and 64 of series 2 respectively. Mesenteric gas also identified on image 37/2. Vascular/Lymphatic: Aortic atherosclerosis. Portal venous gas as detailed above. No abdominal adenopathy. Upper normal right external iliac node is likely reactive at 9 mm. Reproductive: Normal uterus and adnexa. Other: No significant free fluid. Musculoskeletal: Subcutaneous thickening about the left flank on 33/2 similar, likely scarring. Superficial to the coccygeal tip is subcutaneous thickening and gas including on 79/2. Measures on the order of 5.9 x 3.8 cm today versus 4.9 x 3.7 cm on the prior exam. Suspect underlying osteomyelitis, as evidenced by absence of a portion of the distal coccyx on sagittal image 84. L1 compression deformity with trans pedicle screw fixation at T11 through L3. Similar ventral canal encroachment at L1. IMPRESSION: 1. Findings most consistent with ischemic bowel, including portal venous and mesenteric gas. The affected bowel is either  distal small bowel or right-sided colon, given distribution of mesenteric gas. No well-defined pneumatosis identified. 2. Pre coccygeal decubitus ulcer with  suspicion of chronic osteomyelitis. 3.  Possible constipation. 4.  Aortic Atherosclerosis (ICD10-I70.0). 5. Mild limitations, as detailed above. 6. Suprapubic bladder catheter with possible wall thickening and pericystic edema. Correlate with urinalysis to exclude cystitis. These results were called by telephone at the time of interpretation on 05/30/2020 at 3:50 pm to provider Shanon Brow, NP , who verbally acknowledged these results. Electronically Signed   By: Abigail Miyamoto M.D.   On: 05/30/2020 15:51   DG Chest Portable 1 View  Result Date: 05/30/2020 CLINICAL DATA:  Central line placement EXAM: PORTABLE CHEST 1 VIEW COMPARISON:  05/30/2020 at 12:14 p.m. FINDINGS: Single frontal view of the chest demonstrates right internal jugular catheter tip overlying the brachiocephalic confluence. Cardiac silhouette is unremarkable. No airspace disease, effusion, or pneumothorax. No acute bony abnormalities. Stable postsurgical changes at the thoracolumbar junction. IMPRESSION: 1. No complication after right internal jugular catheter placement. Electronically Signed   By: Randa Ngo M.D.   On: 05/30/2020 17:43    Anti-infectives: Anti-infectives (From admission, onward)   Start     Dose/Rate Route Frequency Ordered Stop   05/31/20 0700  vancomycin (VANCOREADY) IVPB 1250 mg/250 mL  Status:  Discontinued        1,250 mg 166.7 mL/hr over 90 Minutes Intravenous Every 12 hours 05/30/20 1732 06/01/20 1028   05/30/20 2200  ceFEPIme (MAXIPIME) 2 g in sodium chloride 0.9 % 100 mL IVPB  Status:  Discontinued        2 g 200 mL/hr over 30 Minutes Intravenous Every 8 hours 05/30/20 1621 05/30/20 1726   05/30/20 2000  meropenem (MERREM) 1 g in sodium chloride 0.9 % 100 mL IVPB        1 g 200 mL/hr over 30 Minutes Intravenous Every 8 hours 05/30/20 1946     05/30/20 1800  vancomycin (VANCOREADY) IVPB 2000 mg/400 mL        2,000 mg 200 mL/hr over 120 Minutes Intravenous  Once 05/30/20 1728 05/30/20 2248   05/30/20 1730   meropenem (MERREM) 1 g in sodium chloride 0.9 % 100 mL IVPB  Status:  Discontinued        1 g 200 mL/hr over 30 Minutes Intravenous Every 8 hours 05/30/20 1726 05/30/20 1946   05/30/20 1245  ceFEPIme (MAXIPIME) 2 g in sodium chloride 0.9 % 100 mL IVPB        2 g 200 mL/hr over 30 Minutes Intravenous  Once 05/30/20 1230 05/30/20 1339      Assessment/Plan: [name] is a 40 yo with portal and mesenteric gas of unknown etiology but no obvious signs of ischemic bowel and reassuring exam on presentation with UTI and suprapubic catheter. I spoke with her Urologist today and updated him.  CT repeated today to assess portal gas and any changes. Official read not back but portal and mesenteric gas is gone, right colon appears thickened like colitis. Will wait official read before feeding Discussed with Dr. Dyann Kief.   LOS: 2 days    Virl Cagey 06/01/2020

## 2020-06-01 NOTE — Transfer of Care (Signed)
Immediate Anesthesia Transfer of Care Note  Patient: [name]  Procedure(s) Performed: EXPLORATORY LAPAROTOMY (N/A )  Patient Location: PACU  Anesthesia Type:General  Level of Consciousness: awake, alert  and oriented  Airway & Oxygen Therapy: Patient Spontanous Breathing and Patient connected to nasal cannula oxygen  Post-op Assessment: Report given to RN and Post -op Vital signs reviewed and stable  Post vital signs: Reviewed and stable  Last Vitals:  Vitals Value Taken Time  BP 132/91 06/01/20 1637  Temp    Pulse 75 06/01/20 1642  Resp 12 06/01/20 1642  SpO2 97 % 06/01/20 1642  Vitals shown include unvalidated device data.  Last Pain:  Vitals:   06/01/20 1507  TempSrc: Oral  PainSc: 7       Patients Stated Pain Goal: 6 (77/37/36 6815)  Complications: No complications documented.

## 2020-06-02 DIAGNOSIS — I10 Essential (primary) hypertension: Secondary | ICD-10-CM | POA: Diagnosis not present

## 2020-06-02 DIAGNOSIS — E038 Other specified hypothyroidism: Secondary | ICD-10-CM | POA: Diagnosis not present

## 2020-06-02 DIAGNOSIS — K219 Gastro-esophageal reflux disease without esophagitis: Secondary | ICD-10-CM | POA: Diagnosis not present

## 2020-06-02 DIAGNOSIS — G8929 Other chronic pain: Secondary | ICD-10-CM | POA: Diagnosis not present

## 2020-06-02 LAB — URINE CULTURE: Culture: >=100000 — AB

## 2020-06-02 MED ORDER — DOCUSATE SODIUM 100 MG PO CAPS
100.0000 mg | ORAL_CAPSULE | Freq: Two times a day (BID) | ORAL | Status: DC
  Administered 2020-06-02 – 2020-06-05 (×7): 100 mg via ORAL
  Filled 2020-06-02 (×7): qty 1

## 2020-06-02 MED ORDER — TIZANIDINE HCL 4 MG PO TABS
4.0000 mg | ORAL_TABLET | Freq: Four times a day (QID) | ORAL | Status: DC | PRN
  Administered 2020-06-04: 4 mg via ORAL
  Filled 2020-06-02: qty 1

## 2020-06-02 MED ORDER — LEVOTHYROXINE SODIUM 88 MCG PO TABS
88.0000 ug | ORAL_TABLET | Freq: Every day | ORAL | Status: DC
  Administered 2020-06-03 – 2020-06-05 (×3): 88 ug via ORAL
  Filled 2020-06-02 (×3): qty 1

## 2020-06-02 MED ORDER — GABAPENTIN 300 MG PO CAPS
300.0000 mg | ORAL_CAPSULE | Freq: Two times a day (BID) | ORAL | Status: DC
Start: 2020-06-02 — End: 2020-06-05
  Administered 2020-06-02 – 2020-06-05 (×6): 300 mg via ORAL
  Filled 2020-06-02 (×7): qty 1

## 2020-06-02 MED ORDER — BUSPIRONE HCL 5 MG PO TABS
15.0000 mg | ORAL_TABLET | Freq: Two times a day (BID) | ORAL | Status: DC
Start: 2020-06-02 — End: 2020-06-05
  Administered 2020-06-02 – 2020-06-05 (×6): 15 mg via ORAL
  Filled 2020-06-02 (×6): qty 3

## 2020-06-02 MED ORDER — OXYCODONE HCL 5 MG PO TABS
10.0000 mg | ORAL_TABLET | Freq: Four times a day (QID) | ORAL | Status: DC | PRN
  Administered 2020-06-02 – 2020-06-05 (×10): 10 mg via ORAL
  Filled 2020-06-02 (×10): qty 2

## 2020-06-02 MED ORDER — MORPHINE SULFATE (PF) 2 MG/ML IV SOLN
2.0000 mg | INTRAVENOUS | Status: DC | PRN
  Administered 2020-06-02 – 2020-06-04 (×6): 2 mg via INTRAVENOUS
  Filled 2020-06-02 (×6): qty 1

## 2020-06-02 MED ORDER — CITALOPRAM HYDROBROMIDE 20 MG PO TABS
40.0000 mg | ORAL_TABLET | Freq: Every day | ORAL | Status: DC
  Administered 2020-06-03 – 2020-06-05 (×3): 40 mg via ORAL
  Filled 2020-06-02 (×3): qty 2

## 2020-06-02 MED ORDER — BUPROPION HCL ER (SR) 150 MG PO TB12
150.0000 mg | ORAL_TABLET | Freq: Two times a day (BID) | ORAL | Status: DC
  Administered 2020-06-02 – 2020-06-05 (×6): 150 mg via ORAL
  Filled 2020-06-02 (×6): qty 1

## 2020-06-02 MED ORDER — PANTOPRAZOLE SODIUM 40 MG PO TBEC
40.0000 mg | DELAYED_RELEASE_TABLET | Freq: Every day | ORAL | Status: DC
  Administered 2020-06-03 – 2020-06-05 (×3): 40 mg via ORAL
  Filled 2020-06-02 (×3): qty 1

## 2020-06-02 NOTE — Progress Notes (Signed)
Report called and given to RN on dept. 300. Pt to be transported via bed.

## 2020-06-02 NOTE — Progress Notes (Signed)
Wound care not due until tomorrow, 4/3, but upon bathing patient before transport the dressing was soiled, so attending RN changed it. Next dressing change will be due 4/4 unless soiled before hand. Will pass on to 300 RN.

## 2020-06-02 NOTE — Progress Notes (Signed)
Pharmacy Antibiotic Note  [name] is a 40 y.o. female admitted on 05/30/2020 with sepsis/ UTI with history of ESBL.  Pharmacy has been consulted for Merren dosing. Patient is AF. UTI with E.COLI ESBL. PCT <0.1, improving, now on clear liquid diet.   Plan: Continue Meropenem 1000 mg IV every 8 hours. Monitor labs, c/s, and patient improvement.  Height: 5\' 6"  (167.6 cm) Weight: 116.8 kg (257 lb 8 oz) IBW/kg (Calculated) : 59.3  Temp (24hrs), Avg:98.3 F (36.8 C), Min:97.9 F (36.6 C), Max:98.7 F (37.1 C)  Recent Labs  Lab 05/30/20 1154 05/30/20 1422 05/30/20 1801 05/30/20 2016 05/31/20 0609 06/01/20 0354  WBC 10.2  --   --   --  10.3  --   CREATININE 0.95  --   --   --  0.47 0.36*  LATICACIDVEN 4.9* 3.5* 2.2* 0.7  --   --     Estimated Creatinine Clearance: 123.9 mL/min (A) (by C-G formula based on SCr of 0.36 mg/dL (L)).    Allergies  Allergen Reactions  . Macrobid [Nitrofurantoin]     Not effective for patient.   . Lisinopril Cough    Antimicrobials this admission: Vanco 3/30 >> 4/1 Merrem 3/30 >> Cefepime 3/30 >> 3/30  Microbiology results: 3/30 BCx: ngtd 3/30 UCx: E.Coli ESBL > 100K CFU/ml  s- imipenem 3/30 MRSA PCR: neg  Thank you for allowing pharmacy to be a part of this patient's care.  Isac Sarna, BS Pharm D, [place] Clinical Pharmacist Pager [phone] 06/02/2020 11:07 AM
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
## 2020-06-02 NOTE — Progress Notes (Signed)
[facility] Progress Note  1 Day Post-Op  Subjective: Only soreness at the incision. No abdominal pain. Eating clear liquids. No nausea/ no BM recorded.   Objective: Vital signs in last 24 hours: Temp:  [97.9 F (36.6 C)-98.7 F (37.1 C)] 98.1 F (36.7 C) (04/02 0800) Pulse Rate:  [72-109] 81 (04/02 1300) Resp:  [7-23] 18 (04/02 1300) BP: (98-147)/(51-91) 142/90 (04/02 1300) SpO2:  [94 %-100 %] 96 % (04/02 1300) Last BM Date: 05/30/20 (per patient report)  Intake/Output from previous day: 04/01 0701 - 04/02 0700 In: 3579.4 [I.V.:3379.4; IV Piggyback:200] Out: 5102 [Urine:3920; Blood:10] Intake/Output this shift: Total I/O In: -  Out: 400 [Urine:400]  General appearance: alert, cooperative and no distress Resp: normal work of breathing GI: soft, nondistended, tender around incision c/d/i with honeycomeb dressing, staples without erythema   Lab Results:  Recent Labs    05/31/20 0609  WBC 10.3  HGB 8.4*  HCT 27.4*  PLT 376   BMET Recent Labs    05/31/20 0609 06/01/20 0354  NA 139  --   K 3.5  --   CL 105  --   CO2 25  --   GLUCOSE 89  --   BUN 6  --   CREATININE 0.47 0.36*  CALCIUM 7.6*  --    PT/INR Recent Labs    05/31/20 0609  LABPROT 15.1  INR 1.2    Studies/Results: CT ABDOMEN PELVIS W CONTRAST  Result Date: 06/01/2020 CLINICAL DATA:  40 year old female with acute abdominal pain, concern for mesenteric ischemia. EXAM: CT ABDOMEN AND PELVIS WITH CONTRAST TECHNIQUE: Multidetector CT imaging of the abdomen and pelvis was performed using the standard protocol following bolus administration of intravenous contrast. CONTRAST:  167mL OMNIPAQUE IOHEXOL 300 MG/ML  SOLN COMPARISON:  05/30/2020 FINDINGS: Lower chest: Acute abnormality. Hepatobiliary: No focal liver abnormality is seen. Status post cholecystectomy. No biliary dilatation. Resolution of previously visualized portal venous gas. Pancreas: Unremarkable. No pancreatic ductal  dilatation or surrounding inflammatory changes. Spleen: Normal in size without focal abnormality. Adrenals/Urinary Tract: Adrenal glands are unremarkable. Kidneys are normal, without renal calculi, focal lesion, or hydronephrosis. Bladder is decompressed with suprapubic catheter in place. Stomach/Bowel: Diffuse mural thickening about the ascending and right transverse colon measuring up to approximately 17 mm. There is surrounding misty mesentery. No significant mural enhancement of the affected colonic segment. No evidence of pneumatosis. The remaining visualized small and large bowel is within normal limits. The stomach appears normal. Vascular/Lymphatic: No significant vascular findings are present. Specifically, no evidence of portal venous gas, portal thrombus, or arterial thromboembolic findings, though the study is limited for vascular evaluation. No enlarged abdominal or pelvic lymph nodes. Reproductive: Uterus and bilateral adnexa are unremarkable. Other: Trace pelvic ascites, increased from comparison. Musculoskeletal: Similar appearing erosive but mostly well corticated changes of the sacrococcygeal junction with overlying thick walled, subcutaneous fluid collection containing gas. Similar appearing lower thoracolumbar PS IF with unchanged chronic compression fracture of the L1 vertebral body. IMPRESSION: 1. Acute mesenteric ischemia affecting the ascending and proximal transverse colon. No pneumatosis visualized or evidence of bowel perforation. Limited evaluation of the portal venous system and superior mesenteric artery, though no definite evidence of thromboembolic etiology of ischemia relative to these vascular territories. Interval resolution of previously visualized portal venous gas. 2. Trace pelvic ascites, increased from comparison. 3. Similar appearing changes of possible sacrococcygeal chronic osteomyelitis with overlying subcutaneous fluid collection containing gas compatible with reported  decubitus ulcer. These results were called by telephone at the time  of interpretation on 06/01/2020 at 1:42 pm to provider [facility] , who verbally acknowledged these results. Ruthann Cancer, MD [facility] Radiology Electronically Signed   By: Ruthann Cancer MD   On: 06/01/2020 13:42    Anti-infectives: Anti-infectives (From admission, onward)   Start     Dose/Rate Route Frequency Ordered Stop   06/01/20 1500  cefoTEtan (CEFOTAN) 2 g in sodium chloride 0.9 % 100 mL IVPB        2 g 200 mL/hr over 30 Minutes Intravenous On call to O.R. 06/01/20 1414 06/01/20 1900   05/31/20 0700  vancomycin (VANCOREADY) IVPB 1250 mg/250 mL  Status:  Discontinued        1,250 mg 166.7 mL/hr over 90 Minutes Intravenous Every 12 hours 05/30/20 1732 06/01/20 1028   05/30/20 2200  ceFEPIme (MAXIPIME) 2 g in sodium chloride 0.9 % 100 mL IVPB  Status:  Discontinued        2 g 200 mL/hr over 30 Minutes Intravenous Every 8 hours 05/30/20 1621 05/30/20 1726   05/30/20 2000  meropenem (MERREM) 1 g in sodium chloride 0.9 % 100 mL IVPB        1 g 200 mL/hr over 30 Minutes Intravenous Every 8 hours 05/30/20 1946     05/30/20 1800  vancomycin (VANCOREADY) IVPB 2000 mg/400 mL        2,000 mg 200 mL/hr over 120 Minutes Intravenous  Once 05/30/20 1728 05/30/20 2248   05/30/20 1730  meropenem (MERREM) 1 g in sodium chloride 0.9 % 100 mL IVPB  Status:  Discontinued        1 g 200 mL/hr over 30 Minutes Intravenous Every 8 hours 05/30/20 1726 05/30/20 1946   05/30/20 1245  ceFEPIme (MAXIPIME) 2 g in sodium chloride 0.9 % 100 mL IVPB        2 g 200 mL/hr over 30 Minutes Intravenous  Once 05/30/20 1230 05/30/20 1339      Assessment/Plan: [name] is a 40 yo s/p Ex lap to evaluate her cecum and ascending colon due to concerns for ischemia on CT. No findings of ischemia and entire bowel looked normal. Tolerating diet. Prn for pain Full liquid diet Monitor for BM Colace now      LOS: 3 days    [name] 06/02/2020

## 2020-06-03 DIAGNOSIS — I10 Essential (primary) hypertension: Secondary | ICD-10-CM | POA: Diagnosis not present

## 2020-06-03 DIAGNOSIS — K219 Gastro-esophageal reflux disease without esophagitis: Secondary | ICD-10-CM | POA: Diagnosis not present

## 2020-06-03 DIAGNOSIS — G8929 Other chronic pain: Secondary | ICD-10-CM | POA: Diagnosis not present

## 2020-06-03 DIAGNOSIS — E038 Other specified hypothyroidism: Secondary | ICD-10-CM | POA: Diagnosis not present

## 2020-06-03 MED ORDER — OXYBUTYNIN CHLORIDE ER 5 MG PO TB24
5.0000 mg | ORAL_TABLET | Freq: Every day | ORAL | Status: DC
  Administered 2020-06-03 – 2020-06-05 (×3): 5 mg via ORAL
  Filled 2020-06-03 (×3): qty 1

## 2020-06-03 NOTE — Progress Notes (Signed)
[facility]  Doing well and having gas, tolerating a diet.  She wants more food. She has not had a BM. She is having urethra leakage from her urine and had some clots.  She says that her Urologist told her to go to the hospital if that happened because she would need surgery.  She asked me to speak with her mother. Her mother is worried about her getting discharged before Dr. Amalia Hailey, Urologist, is aware of the leakage from the urethra and blood clots.   Dr. Dyann Kief is working on speaking with Urology at [facility] he reported to me and I relayed to her.  She does not want her to get discharged from the hospital without Urology being aware of the leakage.  From her Ex lap she is doing well. Tolerating a diet and having pain control. Adv to regular diet. Colace BID  Curlene Labrum, MD [facility] [address] [place], [place] [address] [phone] (office)

## 2020-06-03 NOTE — Plan of Care (Signed)
Alert and oriented x 4. C/o ongoing abdominal pain. Pain medication given with some relief. Refused continuous IV fluids but accepted IV abt. Continues on  full fluids diet and tolerated well. Needs 1 person assist with bed mobility and toileting. Suprapubic catheter intact with clear yellow urine. Continue plan of care.   Problem: Pain Managment: Goal: General experience of comfort will improve Outcome: Progressing   Problem: Skin Integrity: Goal: Risk for impaired skin integrity will decrease Outcome: Progressing   Problem: Education: Goal: Knowledge of General Education information will improve Description: Including pain rating scale, medication(s)/side effects and non-pharmacologic comfort measures Outcome: Progressing   Problem: Health Behavior/Discharge Planning: Goal: Ability to manage health-related needs will improve Outcome: Progressing   Problem: Clinical Measurements: Goal: Ability to maintain clinical measurements within normal limits will improve Outcome: Progressing Goal: Will remain free from infection Outcome: Progressing Goal: Diagnostic test results will improve Outcome: Progressing Goal: Respiratory complications will improve Outcome: Progressing Goal: Cardiovascular complication will be avoided Outcome: Progressing   Problem: Activity: Goal: Risk for activity intolerance will decrease Outcome: Progressing   Problem: Nutrition: Goal: Adequate nutrition will be maintained Outcome: Progressing   Problem: Coping: Goal: Level of anxiety will decrease Outcome: Progressing   Problem: Elimination: Goal: Will not experience complications related to bowel motility Outcome: Progressing Goal: Will not experience complications related to urinary retention Outcome: Progressing   Problem: Safety: Goal: Ability to remain free from injury will improve Outcome: Progressing   Problem: Urinary Elimination: Goal: Signs and symptoms of infection will  decrease Outcome: Progressing   Problem: Fluid Volume: Goal: Hemodynamic stability will improve Outcome: Progressing   Problem: Clinical Measurements: Goal: Diagnostic test results will improve Outcome: Progressing Goal: Signs and symptoms of infection will decrease Outcome: Progressing   Problem: Respiratory: Goal: Ability to maintain adequate ventilation will improve Outcome: Progressing
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
## 2020-06-04 DIAGNOSIS — K219 Gastro-esophageal reflux disease without esophagitis: Secondary | ICD-10-CM | POA: Diagnosis not present

## 2020-06-04 DIAGNOSIS — I10 Essential (primary) hypertension: Secondary | ICD-10-CM | POA: Diagnosis not present

## 2020-06-04 DIAGNOSIS — G8929 Other chronic pain: Secondary | ICD-10-CM | POA: Diagnosis not present

## 2020-06-04 DIAGNOSIS — E038 Other specified hypothyroidism: Secondary | ICD-10-CM | POA: Diagnosis not present

## 2020-06-04 LAB — CREATININE, SERUM
Creatinine, Ser: 0.4 mg/dL — ABNORMAL LOW (ref 0.44–1.00)
GFR, Estimated: >60 mL/min (ref >60)

## 2020-06-04 LAB — CULTURE, BLOOD (ROUTINE X 2)
Culture: NO GROWTH
Culture: NO GROWTH
Special Requests: ADEQUATE
Special Requests: ADEQUATE

## 2020-06-04 MED ORDER — BISACODYL 10 MG RE SUPP
10.0000 mg | Freq: Once | RECTAL | Status: AC
  Administered 2020-06-04: 10 mg via RECTAL
  Filled 2020-06-04: qty 1

## 2020-06-04 NOTE — Plan of Care (Signed)
Alert and oriented x 4. C/o ongoing abdominal pain. Pain medication given with reports of some reief. Turned and repositioned from side to side. Supra-pubic catheter intact and draining urine. No kinks. IV abt tolerated well. Call bell within reach.  Problem: Pain Managment: Goal: General experience of comfort will improve Outcome: Progressing   Problem: Skin Integrity: Goal: Risk for impaired skin integrity will decrease Outcome: Progressing   Problem: Education: Goal: Knowledge of General Education information will improve Description: Including pain rating scale, medication(s)/side effects and non-pharmacologic comfort measures Outcome: Progressing   Problem: Health Behavior/Discharge Planning: Goal: Ability to manage health-related needs will improve Outcome: Progressing   Problem: Clinical Measurements: Goal: Ability to maintain clinical measurements within normal limits will improve Outcome: Progressing Goal: Will remain free from infection Outcome: Progressing Goal: Diagnostic test results will improve Outcome: Progressing Goal: Respiratory complications will improve Outcome: Progressing Goal: Cardiovascular complication will be avoided Outcome: Progressing   Problem: Activity: Goal: Risk for activity intolerance will decrease Outcome: Progressing   Problem: Nutrition: Goal: Adequate nutrition will be maintained Outcome: Progressing   Problem: Coping: Goal: Level of anxiety will decrease Outcome: Progressing   Problem: Elimination: Goal: Will not experience complications related to bowel motility Outcome: Progressing Goal: Will not experience complications related to urinary retention Outcome: Progressing   Problem: Safety: Goal: Ability to remain free from injury will improve Outcome: Progressing   Problem: Urinary Elimination: Goal: Signs and symptoms of infection will decrease Outcome: Progressing   Problem: Fluid Volume: Goal: Hemodynamic stability  will improve Outcome: Progressing   Problem: Clinical Measurements: Goal: Diagnostic test results will improve Outcome: Progressing Goal: Signs and symptoms of infection will decrease Outcome: Progressing   Problem: Respiratory: Goal: Ability to maintain adequate ventilation will improve Outcome: Progressing

## 2020-06-04 NOTE — Patient Outreach (Signed)
Offered support, prayer support for this patient.

## 2020-06-04 NOTE — TOC Initial Note (Signed)
Transition of Care [facility]) - Initial/Assessment Note    Patient Details  Name: [name] MRN: [id number] Date of Birth: 06-08-1980  Transition of Care [facility]) CM/SW Contact:    Boneta Lucks, RN Phone Number: 06/04/2020, 3:46 PM  Clinical Narrative:      Patient admitted with Chronic pain. Patient has a high risk for readmission. Patient now has super pubic catheter, will follow up with outpatient urology at [facility] She lives with her mother and her mother is her caregiver. She has all equipment needed. Used [employer] for transportation. TOC to follow for any discharge needs.    Expected Discharge Plan: Home/Self Care Barriers to Discharge: Continued Medical Work up   Patient Goals and CMS Choice Patient states their goals for this hospitalization and ongoing recovery are:: to go home. CMS Medicare.gov Compare Post Acute Care list provided to:: Patient Choice offered to / list presented to : Patient  Expected Discharge Plan and Services Expected Discharge Plan: Home/Self Care    Living arrangements for the past 2 months: Single Family Home                    Prior Living Arrangements/Services Living arrangements for the past 2 months: Single Family Home Lives with:: Parents Patient language and need for interpreter reviewed:: Yes        Need for Family Participation in Patient Care: Yes (Comment) Care giver support system in place?: Yes (comment)   Criminal Activity/Legal Involvement Pertinent to Current Situation/Hospitalization: No - Comment as needed  Activities of Daily Living Home Assistive Devices/Equipment: Hospital bed,Hoyer Lift,Oxygen,Wheelchair ADL Screening (condition at time of admission) Patient's cognitive ability adequate to safely complete daily activities?: Yes Is the patient deaf or have difficulty hearing?: No Does the patient have difficulty seeing, even when wearing glasses/contacts?: No Does the patient have difficulty concentrating, remembering,  or making decisions?: No Patient able to express need for assistance with ADLs?: Yes Does the patient have difficulty dressing or bathing?: Yes Independently performs ADLs?: No Dressing (OT): Needs assistance Is this a change from baseline?: Pre-admission baseline Grooming: Needs assistance Is this a change from baseline?: Pre-admission baseline Feeding: Independent Bathing: Needs assistance Is this a change from baseline?: Pre-admission baseline Toileting: Dependent In/Out Bed: Dependent Walks in Home: Dependent Does the patient have difficulty walking or climbing stairs?: Yes Weakness of Legs: Both Weakness of Arms/Hands: None  Permission Sought/Granted   Emotional Assessment     Affect (typically observed): Accepting Orientation: : Oriented to Self,Oriented to Place,Oriented to Situation,Oriented to  Time Alcohol / Substance Use: Not Applicable Psych Involvement: No (comment)  Admission diagnosis:  Cystitis [N30.90] Septic shock ([place]) [A41.9, R65.21] Sepsis, due to unspecified organism, unspecified whether acute organ dysfunction present [facility]) [A41.9] Patient Active Problem List   Diagnosis Date Noted  . Septic shock ([place]) 05/30/2020  . Lower abdominal pain   . Cystitis   . Essential hypertension 04/02/2020  . Oxygen dependent 04/02/2020  . Hot flashes 04/02/2020  . Erosion of urethra due to catheterization of urinary tract ([place]) 01/19/2020  . Urge incontinence of urine 01/19/2020  . Hypoxia   . Obesity, Class III, BMI 40-49.9 (morbid obesity) ([place])   . Bilateral ovarian cysts 12/06/2019  . Iron deficiency anemia 12/06/2019  . Muscle spasticity 12/06/2019  . Hyperlipidemia   . Chronic cystitis with hematuria 10/28/2019  . Continuous leakage of urine 10/28/2019  . Generalized anxiety disorder 07/04/2019  . Panic attacks 07/04/2019  . Controlled substance agreement signed 07/04/2019  . Urinary  catheter in place 07/04/2019  . Mixed hyperlipidemia 07/04/2019  .  Neurogenic bladder 07/04/2019  . Wound infection after surgery 01/07/2019  . Pressure ulcer   . Osteomyelitis of vertebra, sacral and sacrococcygeal region ([place])   . GERD (gastroesophageal reflux disease)   . Protein-calorie malnutrition, severe ([place]) 11/07/2018  . Hypokalemia 05/04/2018  . Chronic pain 05/04/2018  . Tinea   . 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 Sacral osteomyelitis ([place])   . Pressure injury of skin 06/11/2016  . Sepsis ([place]) 06/10/2016  . Normocytic anemia 06/10/2016  . 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 L1 vertebral fracture ([place]) 04/07/2016  . Dysmenorrhea 08/09/2012  . Irregular menstrual cycle 08/09/2012  . Shoulder pain, left 11/07/2011  . H/O vitamin D deficiency 09/02/2011  . Obesity 08/06/2010  . Hypothyroidism 08/06/2010  . Recurrent depression ([place]) 08/06/2010  . Mental retardation, mild (I.Q. 50-70) 08/06/2010   PCP:  Loman Brooklyn, [name] Pharmacy:   [id number] [place], [address] [place] [address] Phone: [phone] Fax: [place], [name] [address] Phone: [phone] Fax: [phone]  Readmission Risk Interventions Readmission Risk Prevention Plan 06/04/2020 01/13/2020 01/10/2020  Transportation Screening Complete - Complete  PCP or Specialist Appt within 3-5 Days Complete - -  HRI or Home Care Consult Complete - -  Social Work Consult for [place] Planning/Counseling Complete - -  Palliative Care Screening Not Applicable - -  Medication Review Press photographer) Complete - Complete  PCP or Specialist appointment within 3-5 days of discharge - Complete -  [place] or [place] - - Complete  SW Recovery  Care/Counseling Consult - - Complete  [place] - - Not Applicable  Some recent data might be hidden
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
## 2020-06-04 NOTE — Progress Notes (Signed)
[facility] Progress Note  3 Days Post-Op  Subjective: Tolerating diet and no nausea. No bm reported. Says she has done suppositories at home some.   Objective: Vital signs in last 24 hours: Temp:  [98.6 F (37 C)] 98.6 F (37 C) (04/04 0835) Pulse Rate:  [86-90] 90 (04/04 0835) Resp:  [17-18] 18 (04/04 0356) BP: (109-144)/(66-97) 128/79 (04/04 0835) SpO2:  [93 %-99 %] 93 % (04/04 0835) Last BM Date: 05/30/20  Intake/Output from previous day: 04/03 0701 - 04/04 0700 In: 1471.2 [P.O.:1080; IV Piggyback:391.2] Out: 2350 [Urine:2350] Intake/Output this shift: No intake/output data recorded.  General appearance: alert, cooperative and no distress GI: soft, nondistended, staples c/d/i with no erythema, minor irritation of mid abdominal skin but no signs of infection  Lab Results:  No results for input(s): WBC, HGB, HCT, PLT in the last 72 hours. BMET Recent Labs    06/04/20 0632  CREATININE 0.40*   PT/INR No results for input(s): LABPROT, INR in the last 72 hours.  Studies/Results: No results found.  Anti-infectives: Anti-infectives (From admission, onward)   Start     Dose/Rate Route Frequency Ordered Stop   06/01/20 1500  cefoTEtan (CEFOTAN) 2 g in sodium chloride 0.9 % 100 mL IVPB        2 g 200 mL/hr over 30 Minutes Intravenous On call to O.R. 06/01/20 1414 06/01/20 1900   05/31/20 0700  vancomycin (VANCOREADY) IVPB 1250 mg/250 mL  Status:  Discontinued        1,250 mg 166.7 mL/hr over 90 Minutes Intravenous Every 12 hours 05/30/20 1732 06/01/20 1028   05/30/20 2200  ceFEPIme (MAXIPIME) 2 g in sodium chloride 0.9 % 100 mL IVPB  Status:  Discontinued        2 g 200 mL/hr over 30 Minutes Intravenous Every 8 hours 05/30/20 1621 05/30/20 1726   05/30/20 2000  meropenem (MERREM) 1 g in sodium chloride 0.9 % 100 mL IVPB        1 g 200 mL/hr over 30 Minutes Intravenous Every 8 hours 05/30/20 1946     05/30/20 1800  vancomycin (VANCOREADY) IVPB 2000  mg/400 mL        2,000 mg 200 mL/hr over 120 Minutes Intravenous  Once 05/30/20 1728 05/30/20 2248   05/30/20 1730  meropenem (MERREM) 1 g in sodium chloride 0.9 % 100 mL IVPB  Status:  Discontinued        1 g 200 mL/hr over 30 Minutes Intravenous Every 8 hours 05/30/20 1726 05/30/20 1946   05/30/20 1245  ceFEPIme (MAXIPIME) 2 g in sodium chloride 0.9 % 100 mL IVPB        2 g 200 mL/hr over 30 Minutes Intravenous  Once 05/30/20 1230 05/30/20 1339      Assessment/Plan: Ms. Bartelt is a 41 yo s/p Exploratory laparotomy to rule out ischemic bowel given CT findings, exploration was negative. Doing well and on a diet.  Colace BID, and added suppository today Would think could go home in next 24 hours, she is worried about no one being at home today and has not had BM 4/12 staple removal in my office    LOS: 5 days    Virl Cagey 06/04/2020

## 2020-06-05 ENCOUNTER — Encounter (HOSPITAL_COMMUNITY): Payer: Self-pay | Admitting: General Surgery

## 2020-06-05 DIAGNOSIS — I10 Essential (primary) hypertension: Secondary | ICD-10-CM | POA: Diagnosis not present

## 2020-06-05 DIAGNOSIS — K219 Gastro-esophageal reflux disease without esophagitis: Secondary | ICD-10-CM | POA: Diagnosis not present

## 2020-06-05 DIAGNOSIS — A4151 Sepsis due to Escherichia coli [E. coli]: Principal | ICD-10-CM

## 2020-06-05 DIAGNOSIS — G8929 Other chronic pain: Secondary | ICD-10-CM | POA: Diagnosis not present

## 2020-06-05 DIAGNOSIS — Z6841 Body Mass Index (BMI) 40.0 and over, adult: Secondary | ICD-10-CM

## 2020-06-05 DIAGNOSIS — K639 Disease of intestine, unspecified: Secondary | ICD-10-CM | POA: Diagnosis not present

## 2020-06-05 LAB — TYPE AND SCREEN
ABO/RH(D): A POS
Antibody Screen: NEGATIVE
Unit division: 0
Unit division: 0

## 2020-06-05 LAB — BPAM RBC
Blood Product Expiration Date: 202204142359
Blood Product Expiration Date: 202204192359
Unit Type and Rh: 6200
Unit Type and Rh: 6200

## 2020-06-05 MED ORDER — LOSARTAN POTASSIUM 50 MG PO TABS: 0.5000 | ORAL_TABLET | Freq: Every day | ORAL | Status: DC

## 2020-06-05 NOTE — TOC Transition Note (Signed)
Transition of Care [facility]) - CM/SW Discharge Note   Patient Details  Name: [name] MRN: [id number] Date of Birth: May 25, 1980  Transition of Care [facility]) CM/SW Contact:  Boneta Lucks, RN Phone Number: 06/05/2020, 1:09 PM   Clinical Narrative:   Patient is discharging home.  Medical necessity printed. TOC will call EMS when RN is ready.     Final next level of care: Home/Self Care Barriers to Discharge: Continued Medical Work up   Patient Goals and CMS Choice Patient states their goals for this hospitalization and ongoing recovery are:: to go home. CMS Medicare.gov Compare Post Acute Care list provided to:: Patient Choice offered to / list presented to : Patient  Discharge Placement              Patient chooses bed at:  [facility]) Patient to be transferred to facility by: EMS   Patient and family notified of of transfer: 06/05/20  Discharge Plan and Services        Readmission Risk Interventions Readmission Risk Prevention Plan 06/04/2020 01/13/2020 01/10/2020  Transportation Screening Complete - Complete  PCP or Specialist Appt within 3-5 Days Complete - -  HRI or Home Care Consult Complete - -  Social Work Consult for [place] Planning/Counseling Complete - -  Palliative Care Screening Not Applicable - -  Medication Review Press photographer) Complete - Complete  PCP or Specialist appointment within 3-5 days of discharge - Complete -  [place] or [place] - - Complete  SW Recovery Care/Counseling Consult - - Complete  [place] - - Not Applicable  Some recent data might be hidden

## 2020-06-05 NOTE — Discharge Instructions (Signed)
Hemorrhagic Cystitis  Hemorrhagic cystitis is bleeding from damage to the inner lining of the bladder. This condition results when the inner lining of the bladder (transitional epithelium) is damaged along with the blood vessels that supply the area. Hemorrhagic cystitis may make it difficult or painful to pass urine.  What are the causes?  This condition may be caused by:  · Damage from certain cancer treatments. This is the most common cause. It can result from:  ? Radiation treatment that involves the bladder.  ? Chemotherapy drugs used to treat certain cancers or to treat people who have a bone marrow transplant (cyclophosphamide and ifosfamide).  · Infections with bacteria or viruses, especially in people with a weak body defense system (immune system).  Rare causes of the condition include:  · Other drugs, including penicillin drugs and a type of steroid (danazol).  · Exposure to toxic chemicals used in dyes, markers, shoe polish, or pesticides.  What are the signs or symptoms?  The main sign of this condition is blood in the urine (hematuria). This can range from very mild to severe.  · Mild hematuria can include microscopic bleeding that does not change the color of your urine.  · Severe hematuria can cause you to have urine with bright red blood or blood clots in it. In some cases, severe hematuria can cause large clots that fill the bladder and block urine flow (urinary obstruction).  Hemorrhagic cystitis may also cause symptoms such as:  · An urgent or frequent need to pass urine.  · Pain when passing urine.  · Lower belly pain and fullness.  · Urinary obstruction.  How is this diagnosed?  This condition may be diagnosed based on:  · Your symptoms and medical history.  · A physical exam.  · Tests, such as:  ? Urine tests to check for blood or signs of infection.  ? Blood tests to check for signs of infection and a low red blood cell count due to bleeding.  ? Imaging tests of the bladder, such as  ultrasound, CT scan, or MRI.  ? A procedure to examine the inside of your bladder using a flexible scope (cystoscopy).  How is this treated?  Treatment depends on the cause of the condition and how severe the bleeding is. If you are being treated with chemotherapy drugs, you may be given other medicines to reduce the risk for this condition during treatment. Other treatments may include:  · Removing your exposure to substances that are causing the condition, such as a chemical toxin or a medicine.  · Taking antibiotic or antiviral medicine if the condition is caused by an infection.  You may also be treated for hematuria. This can include:  · Fluids (hydration), bed rest, and observation. These methods may be all that is needed if you are able to pass urine and have no blood clots.  · Placing a flexible tube (catheter) into the bladder to continuously flush out (irrigate) the bladder with sterile saline solution. This may be needed if clots are passing or if bleeding is continuing.  · Medicine to reduce bleeding.  · A cystoscopy to remove clots if clots are filling the bladder. This may also include a procedure to stop bleeding (coagulation).  · A transfusion to replace blood loss.  You may need surgery to stop bleeding or to remove the bladder if other treatments have not helped.  Follow these instructions at home:    · If you had surgery, your health   care provider will give you instructions for taking care of yourself at home after your procedure. Follow these instructions carefully.  · Take over-the-counter and prescription medicines only as told by your health care provider.  · If you were prescribed an antibiotic medicine, take it as told by your health care provider. Do not stop using the antibiotic even if you start to feel better.  · Return to your normal activities as told by your health care provider. Ask your health care provider what activities are safe for you.  · Drink enough fluid to keep your urine  pale yellow.  · Keep all follow-up visits as told by your health care provider. This is important.  Contact a health care provider if you have:  · Chills or a fever.  · Blood in your urine.  · An urgent or frequent need to pass urine.  · Pain when passing urine.  Get help right away if you:  · Have bright red blood or clots in your urine.  · Are unable to pass urine.  Summary  · Hemorrhagic cystitis is bleeding caused by damage to the inner lining of your bladder.  · Hemorrhagic cystitis may make it difficult or painful to pass urine.  · This condition may be caused by damage from infections, radiation therapy, or chemotherapy drugs.  · Blood in the urine (hematuria) is the main sign of hemorrhagic cystitis. Hematuria can be very mild and involve microscopic bleeding that does not change the color of urine. It can also be severe and include passing urine with bright red blood or blood clots in it.  · Treatment for hemorrhagic cystitis depends on the cause and severity of the condition. In most cases, the condition will clear up with supportive care that may include rest, fluids, and antibiotics, along with removing exposure to the cause. Other treatments may be needed in more serious cases.  This information is not intended to replace advice given to you by your health care provider. Make sure you discuss any questions you have with your health care provider.  Document Revised: 10/15/2017 Document Reviewed: 10/15/2017  [employer] Patient Education © 2021 [employer]
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
## 2020-06-06 ENCOUNTER — Telehealth: Payer: Self-pay

## 2020-06-06 NOTE — Telephone Encounter (Signed)
Transition Care Management Follow-up Telephone Call  Date of discharge and from where: 06/05/20 from [facility]  How have you been since you were released from the hospital? Better, but not 100% Any questions or concerns? Yes  She was told to cut Losartan in half, but her BP was still low yesterday - 100/60, today it is 135/77 - She is afraid BP will drop again and wants to know if she should stop taking until she follows up here.  Items Reviewed:  Did the pt receive and understand the discharge instructions provided? Yes   Medications obtained and verified? Yes   Other? No   Any new allergies since your discharge? No   Dietary orders reviewed? Yes  Do you have support at home? Yes   Home Care and Equipment/Supplies: Were home health services ordered? no Were any new equipment or medical supplies ordered?  No  Functional Questionnaire: (I = Independent and D = Dependent) ADLs: D  Bathing/Dressing- D  Meal Prep- D  Eating- I  Maintaining continence- D  Transferring/Ambulation- D  Managing Meds- I  Follow up appointments reviewed:   PCP Hospital f/u appt confirmed? No  She wants to wait until her follow up appts with wound care and GI, then she will call for appt by the end of this month.   [facility] f/u appt confirmed? Yes  Scheduled to see Reather Converse and Dellia Nims, wound care on 06/11/20  Are transportation arrangements needed? No   If their condition worsens, is the pt aware to call PCP or go to the Emergency Dept.? Yes  Was the patient provided with contact information for the PCP's office or ED? Yes  Was to pt encouraged to call back with questions or concerns? Yes

## 2020-06-06 NOTE — Telephone Encounter (Signed)
Patient aware and verbalizes understanding. 

## 2020-06-06 NOTE — Telephone Encounter (Signed)
Upon chart review it appears her BP was low because she was septic. It will come back up as this resolved. She can check BP prior to taking medication and hold medication if the top number is <100.

## 2020-06-08 ENCOUNTER — Encounter (HOSPITAL_COMMUNITY): Payer: Self-pay | Admitting: Emergency Medicine

## 2020-06-08 ENCOUNTER — Other Ambulatory Visit: Payer: Self-pay

## 2020-06-08 ENCOUNTER — Emergency Department (HOSPITAL_COMMUNITY)
Admission: EM | Admit: 2020-06-08 | Discharge: 2020-06-08 | Disposition: A | Discharge status: Home / Self Care | Payer: Medicaid Other | Attending: Emergency Medicine | Admitting: Emergency Medicine

## 2020-06-08 DIAGNOSIS — I10 Essential (primary) hypertension: Secondary | ICD-10-CM | POA: Diagnosis not present

## 2020-06-08 DIAGNOSIS — Z9049 Acquired absence of other specified parts of digestive tract: Secondary | ICD-10-CM | POA: Insufficient documentation

## 2020-06-08 DIAGNOSIS — R1030 Lower abdominal pain, unspecified: Secondary | ICD-10-CM | POA: Diagnosis present

## 2020-06-08 DIAGNOSIS — E039 Hypothyroidism, unspecified: Secondary | ICD-10-CM | POA: Insufficient documentation

## 2020-06-08 DIAGNOSIS — Z79899 Other long term (current) drug therapy: Secondary | ICD-10-CM | POA: Diagnosis not present

## 2020-06-08 DIAGNOSIS — Z87891 Personal history of nicotine dependence: Secondary | ICD-10-CM | POA: Diagnosis not present

## 2020-06-08 DIAGNOSIS — S301XXA Contusion of abdominal wall, initial encounter: Secondary | ICD-10-CM

## 2020-06-08 MED ORDER — LIDOCAINE 5 % EX OINT: 1.0000 "application " | TOPICAL_OINTMENT | CUTANEOUS | 0 refills | Status: DC | PRN

## 2020-06-08 MED ORDER — OXYCODONE HCL 5 MG PO TABS
10.0000 mg | ORAL_TABLET | Freq: Once | ORAL | Status: AC
  Administered 2020-06-08: 10 mg via ORAL
  Filled 2020-06-08: qty 2
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
## 2020-06-08 NOTE — Discharge Instructions (Signed)
I have ordered some lidocaine ointment which you may apply around the catheter site to relieve some of your discomfort. Contact a health care provider if: You have a fever. You have redness or pain at the site of the seroma. Your seroma is more swollen or is getting bigger. You have more fluid coming from the seroma. Your seroma feels warm to the touch. You have pus or a bad smell coming from the seroma. Get help right away if: You have a fever along with severe pain, redness at the site, or chills. You feel confused or have trouble staying awake. You feel like your heart is beating very fast. You feel short of breath or are breathing rapidly. You have cool, clammy, or sweaty skin.

## 2020-06-08 NOTE — ED Triage Notes (Signed)
Pt present with abdominal pain and back pain x 3 days. Recent abdominal surgery and placement of subra pubic catheter

## 2020-06-08 NOTE — ED Notes (Signed)
Called EMS for transport home 

## 2020-06-11 ENCOUNTER — Encounter (HOSPITAL_BASED_OUTPATIENT_CLINIC_OR_DEPARTMENT_OTHER): Payer: Medicaid Other | Admitting: Internal Medicine

## 2020-06-12 ENCOUNTER — Ambulatory Visit (INDEPENDENT_AMBULATORY_CARE_PROVIDER_SITE_OTHER): Payer: Medicaid Other | Admitting: General Surgery

## 2020-06-12 ENCOUNTER — Encounter: Payer: Self-pay | Admitting: General Surgery

## 2020-06-12 ENCOUNTER — Other Ambulatory Visit: Payer: Self-pay

## 2020-06-12 VITALS — BP 113/80 | HR 86 | Temp 98.2°F | Resp 16 | Ht 66.0 in | Wt 257.0 lb

## 2020-06-12 DIAGNOSIS — Z9889 Other specified postprocedural states: Secondary | ICD-10-CM

## 2020-06-12 NOTE — Patient Instructions (Signed)
Steri strips will start to peel up in 5-7 days. Ok to remove once peeling up. Ok to shower and get incision wet. Pat it dry. Monitor for any redness extending out from the staple sites.

## 2020-06-13 NOTE — Progress Notes (Signed)
[facility] Note   HPI:  40 y.o. Female presents to clinic for post-op follow-up evaluation of after exploratory laparotomy for CT scan with continued concern for ischemic cecum and ascending colon.  Exploration negative and colon normal from outside, possibly some colitis based on the CT and the thickening.   Review of Systems:  Eating  Having Bms All other review of systems: otherwise negative   Vital Signs:  BP 113/80   Pulse 86   Temp 98.2 F (36.8 C) (Oral)   Resp 16   Ht 5\' 6"  (1.676 m)   Wt 257 lb (116.6 kg)   SpO2 96%   BMI 41.48 kg/m    Physical Exam:  Physical Exam Vitals reviewed.  Constitutional:      Appearance: She is obese.  Cardiovascular:     Rate and Rhythm: Normal rate.  Pulmonary:     Effort: Pulmonary effort is normal.  Abdominal:     General: There is no distension.     Palpations: Abdomen is soft.     Tenderness: There is no abdominal tenderness.     Comments: Incision with staples, removed, local erythema at the staple site from irritation, no cellulitis or drainage, steri strips placed  Suprapubic catheter with some minor drainage but no erythema or signs of infection  Neurological:     Mental Status: She is alert.      Assessment:  40 y.o. yo Female s/p Ex lap and rule out of any ischemic bowel after CT with continued concern.  Doing well and eating and having Bms. She follows with Dr. Amalia Hailey for Urology and is seeing him soon..  Plan:  Incision staple site with some irritation but no signs of infection Monitor for worsening redness or drainage Steri strips placed and will fall off in 5-7 days, ok to shower  PRN follow up  Curlene Labrum, [place] [address] [place], [place] [id number] [phone] (office)

## 2020-06-14 ENCOUNTER — Ambulatory Visit: Payer: Medicaid Other | Admitting: Family Medicine

## 2020-06-14 ENCOUNTER — Encounter: Payer: Self-pay | Admitting: Family Medicine

## 2020-06-14 ENCOUNTER — Other Ambulatory Visit: Payer: Self-pay

## 2020-06-14 VITALS — BP 113/81 | HR 85 | Temp 97.0°F

## 2020-06-14 DIAGNOSIS — N39498 Other specified urinary incontinence: Secondary | ICD-10-CM | POA: Diagnosis not present

## 2020-06-14 DIAGNOSIS — T3695XA Adverse effect of unspecified systemic antibiotic, initial encounter: Secondary | ICD-10-CM

## 2020-06-14 DIAGNOSIS — E782 Mixed hyperlipidemia: Secondary | ICD-10-CM

## 2020-06-14 DIAGNOSIS — B379 Candidiasis, unspecified: Secondary | ICD-10-CM

## 2020-06-14 DIAGNOSIS — N649 Disorder of breast, unspecified: Secondary | ICD-10-CM

## 2020-06-14 DIAGNOSIS — G8929 Other chronic pain: Secondary | ICD-10-CM

## 2020-06-14 DIAGNOSIS — Z79899 Other long term (current) drug therapy: Secondary | ICD-10-CM

## 2020-06-14 DIAGNOSIS — Z9359 Other cystostomy status: Secondary | ICD-10-CM | POA: Diagnosis not present

## 2020-06-14 DIAGNOSIS — D649 Anemia, unspecified: Secondary | ICD-10-CM

## 2020-06-14 DIAGNOSIS — I10 Essential (primary) hypertension: Secondary | ICD-10-CM

## 2020-06-14 DIAGNOSIS — Z8619 Personal history of other infectious and parasitic diseases: Secondary | ICD-10-CM

## 2020-06-14 DIAGNOSIS — Z09 Encounter for follow-up examination after completed treatment for conditions other than malignant neoplasm: Secondary | ICD-10-CM

## 2020-06-14 MED ORDER — DICLOFENAC SODIUM 1 % EX GEL: 2.0000 g | Freq: Four times a day (QID) | CUTANEOUS | 2 refills | Status: DC

## 2020-06-14 MED ORDER — FLUCONAZOLE 150 MG PO TABS: 150.0000 mg | ORAL_TABLET | Freq: Once | ORAL | 0 refills | Status: AC
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
## 2020-06-15 LAB — LIPID PANEL
Chol/HDL Ratio: 6.6 ratio — ABNORMAL HIGH (ref 0.0–4.4)
Cholesterol, Total: 239 mg/dL — ABNORMAL HIGH (ref 100–199)
HDL: 36 mg/dL — ABNORMAL LOW (ref >39)
LDL Chol Calc (NIH): 169 mg/dL — ABNORMAL HIGH (ref 0–99)
Triglycerides: 182 mg/dL — ABNORMAL HIGH (ref 0–149)
VLDL Cholesterol Cal: 34 mg/dL (ref 5–40)

## 2020-06-15 LAB — CMP14+EGFR
ALT: 19 IU/L (ref 0–32)
AST: 26 IU/L (ref 0–40)
Albumin/Globulin Ratio: 1.4 (ref 1.2–2.2)
Albumin: 4.2 g/dL (ref 3.8–4.8)
Alkaline Phosphatase: 121 IU/L (ref 44–121)
BUN/Creatinine Ratio: 13 (ref 9–23)
BUN: 8 mg/dL (ref 6–20)
Bilirubin Total: 0.3 mg/dL (ref 0.0–1.2)
CO2: 25 mmol/L (ref 20–29)
Calcium: 9.7 mg/dL (ref 8.7–10.2)
Chloride: 98 mmol/L (ref 96–106)
Creatinine, Ser: 0.62 mg/dL (ref 0.57–1.00)
Globulin, Total: 2.9 g/dL (ref 1.5–4.5)
Glucose: 87 mg/dL (ref 65–99)
Potassium: 4.4 mmol/L (ref 3.5–5.2)
Sodium: 139 mmol/L (ref 134–144)
Total Protein: 7.1 g/dL (ref 6.0–8.5)
eGFR: 117 mL/min/{1.73_m2} (ref >59)

## 2020-06-15 LAB — CBC WITH DIFFERENTIAL/PLATELET
Basophils Absolute: 0.1 10*3/uL (ref 0.0–0.2)
Basos: 1 %
EOS (ABSOLUTE): 0.4 10*3/uL (ref 0.0–0.4)
Eos: 5 %
Hematocrit: 34.3 % (ref 34.0–46.6)
Hemoglobin: 10.7 g/dL — ABNORMAL LOW (ref 11.1–15.9)
Immature Grans (Abs): 0 10*3/uL (ref 0.0–0.1)
Immature Granulocytes: 0 %
Lymphocytes Absolute: 2.7 10*3/uL (ref 0.7–3.1)
Lymphs: 35 %
MCH: 27.9 pg (ref 26.6–33.0)
MCHC: 31.2 g/dL — ABNORMAL LOW (ref 31.5–35.7)
MCV: 90 fL (ref 79–97)
Monocytes Absolute: 0.7 10*3/uL (ref 0.1–0.9)
Monocytes: 9 %
Neutrophils Absolute: 3.9 10*3/uL (ref 1.4–7.0)
Neutrophils: 50 %
Platelets: 595 10*3/uL — ABNORMAL HIGH (ref 150–450)
RBC: 3.83 x10E6/uL (ref 3.77–5.28)
RDW: 13.2 % (ref 11.7–15.4)
WBC: 7.8 10*3/uL (ref 3.4–10.8)

## 2020-06-16 ENCOUNTER — Other Ambulatory Visit: Payer: Self-pay | Admitting: Family Medicine

## 2020-06-16 DIAGNOSIS — F411 Generalized anxiety disorder: Secondary | ICD-10-CM

## 2020-06-16 DIAGNOSIS — F410 Panic disorder [episodic paroxysmal anxiety] without agoraphobia: Secondary | ICD-10-CM

## 2020-06-18 ENCOUNTER — Telehealth: Payer: Self-pay

## 2020-06-18 ENCOUNTER — Encounter: Payer: Self-pay | Admitting: Family Medicine

## 2020-06-18 MED ORDER — OXYCODONE HCL 10 MG PO TABS: 10.0000 mg | ORAL_TABLET | Freq: Three times a day (TID) | ORAL | 0 refills | Status: DC

## 2020-06-18 NOTE — Telephone Encounter (Signed)
Lmtcb.

## 2020-06-19 ENCOUNTER — Encounter (HOSPITAL_BASED_OUTPATIENT_CLINIC_OR_DEPARTMENT_OTHER): Payer: Medicaid Other | Attending: Internal Medicine | Admitting: Internal Medicine

## 2020-06-19 ENCOUNTER — Other Ambulatory Visit: Payer: Self-pay

## 2020-06-19 DIAGNOSIS — G8222 Paraplegia, incomplete: Secondary | ICD-10-CM | POA: Diagnosis not present

## 2020-06-19 DIAGNOSIS — L89154 Pressure ulcer of sacral region, stage 4: Secondary | ICD-10-CM

## 2020-06-19 DIAGNOSIS — Z8249 Family history of ischemic heart disease and other diseases of the circulatory system: Secondary | ICD-10-CM | POA: Insufficient documentation

## 2020-06-20 ENCOUNTER — Other Ambulatory Visit: Payer: Self-pay | Admitting: Family Medicine

## 2020-06-20 DIAGNOSIS — F411 Generalized anxiety disorder: Secondary | ICD-10-CM

## 2020-06-20 DIAGNOSIS — F339 Major depressive disorder, recurrent, unspecified: Secondary | ICD-10-CM

## 2020-06-20 NOTE — Telephone Encounter (Signed)
Closing encounter. Lab results noted pt got results on 06/19/20

## 2020-06-25 ENCOUNTER — Telehealth: Payer: Self-pay

## 2020-06-25 NOTE — Telephone Encounter (Signed)
oxyCODONE HCl 10MG  tablets  (Key: [id number]) - [id number]  Sent to plan

## 2020-07-02 ENCOUNTER — Telehealth: Payer: Self-pay

## 2020-07-02 NOTE — Telephone Encounter (Signed)
Mother called in and said pt is in a lock in program. She got a letter from the pharmacy saying that her form for her oxycodone was not filled out right and prescription was denied. Pt uses [facility] [place]

## 2020-07-02 NOTE — Telephone Encounter (Signed)
Mom and pharmacy aware.

## 2020-07-02 NOTE — Telephone Encounter (Signed)
PA re-submitted through [employer].  Confirmation #:[id number]

## 2020-07-02 NOTE — Telephone Encounter (Signed)
Checked cover my meds  Status Sent to Plan on April 25 [employer] The plan will fax you a determination, typically within 1 to 5 business days.  Waiting on to hear back from PA

## 2020-07-02 NOTE — Telephone Encounter (Signed)
Mom received certified letter about her Oxycodone, and that you have to have it answered before 07/07/20 or a request hearing will have to be done on 07/07/20

## 2020-07-02 NOTE — Telephone Encounter (Signed)
PA APPROVED-07/02/2020 - 12/29/2020
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
## 2020-07-06 NOTE — Progress Notes (Signed)
[name], [name] ([id number]) Visit Report for 06/19/2020 Arrival Information Details Patient Name: Date of Service: [name], [name] 06/19/2020 1:15 PM Medical Record Number: [id number] Patient Account Number: [id number] Date of Birth/Sex: Treating RN: [birth year] (40 y.o. [name] Primary Care Tanveer Dobberstein: [name] Other Clinician: Referring Sanyah Molnar: Treating Dondre Catalfamo/Extender: Rober Minion Weeks in Treatment: 207 Visit Information History Since Last Visit Added or deleted any medications: Yes Patient Arrived: Wheel Chair Any new allergies or adverse reactions: No Arrival Time: 13:16 Had a fall or experienced change in No Accompanied By: mother activities of daily living that may affect Transfer Assistance: Harrel Lemon Lift risk of falls: Patient Identification Verified: Yes Signs or symptoms of abuse/neglect since last visito No Secondary Verification Process Completed: Yes Hospitalized since last visit: Yes Patient Requires Transmission-Based Precautions: No Implantable device outside of the clinic excluding No Patient Has Alerts: No cellular tissue based products placed in the center since last visit: Has Dressing in Place as Prescribed: Yes Pain Present Now: Yes Electronic Signature(s) Signed: 06/20/2020 6:20:01 PM By: Baruch Gouty RN, BSN Entered By: Baruch Gouty on 06/19/2020 13:34:16 -------------------------------------------------------------------------------- Clinic Level of Care Assessment Details Patient Name: Date of Service: [name], [name] 06/19/2020 1:15 PM Medical Record Number: [id number] Patient Account Number: [id number] Date of Birth/Sex: Treating RN: [birth year] (40 y.o. [name]: [name] Other Clinician: Referring Sheetal Lyall: Treating Destenie Ingber/Extender: Rober Minion Weeks in Treatment: 207 Clinic Level of Care Assessment Items TOOL 4 Quantity  Score X- 1 0 Use when only an EandM is performed on FOLLOW-UP visit ASSESSMENTS - Nursing Assessment / Reassessment X- 1 10 Reassessment of Co-morbidities (includes updates in patient status) X- 1 5 Reassessment of Adherence to Treatment Plan ASSESSMENTS - Wound and Skin A ssessment / Reassessment X - Simple Wound Assessment / Reassessment - one wound 1 5 '[]'  - 0 Complex Wound Assessment / Reassessment - multiple wounds X- 1 10 Dermatologic / Skin Assessment (not related to wound area) ASSESSMENTS - Focused Assessment '[]'  - 0 Circumferential Edema Measurements - multi extremities X- 1 10 Nutritional Assessment / Counseling / Intervention '[]'  - 0 Lower Extremity Assessment (monofilament, tuning fork, pulses) '[]'  - 0 Peripheral Arterial Disease Assessment (using hand held doppler) ASSESSMENTS - Ostomy and/or Continence Assessment and Care '[]'  - 0 Incontinence Assessment and Management '[]'  - 0 Ostomy Care Assessment and Management (repouching, etc.) PROCESS - Coordination of Care X - Simple Patient / Family Education for ongoing care 1 15 '[]'  - 0 Complex (extensive) Patient / Family Education for ongoing care X- 1 10 Staff obtains Programmer, systems, Records, T Results / Process Orders est '[]'  - 0 Staff telephones HHA, Nursing Homes / Clarify orders / etc '[]'  - 0 Routine Transfer to another Facility (non-emergent condition) '[]'  - 0 Routine Hospital Admission (non-emergent condition) '[]'  - 0 New Admissions / Biomedical engineer / Ordering NPWT Apligraf, etc. , '[]'  - 0 Emergency Hospital Admission (emergent condition) X- 1 10 Simple Discharge Coordination '[]'  - 0 Complex (extensive) Discharge Coordination PROCESS - Special Needs '[]'  - 0 Pediatric / Minor Patient Management '[]'  - 0 Isolation Patient Management '[]'  - 0 Hearing / Language / Visual special needs '[]'  - 0 Assessment of Community assistance (transportation, D/C planning, etc.) '[]'  - 0 Additional assistance / Altered  mentation '[]'  - 0 Support Surface(s) Assessment (bed, cushion, seat, etc.) INTERVENTIONS - Wound Cleansing / Measurement X - Simple Wound Cleansing - one wound 1 5 '[]'  - 0 Complex Wound Cleansing -  multiple wounds X- 1 5 Wound Imaging (photographs - any number of wounds) '[]'  - 0 Wound Tracing (instead of photographs) X- 1 5 Simple Wound Measurement - one wound '[]'  - 0 Complex Wound Measurement - multiple wounds INTERVENTIONS - Wound Dressings X - Small Wound Dressing one or multiple wounds 1 10 '[]'  - 0 Medium Wound Dressing one or multiple wounds '[]'  - 0 Large Wound Dressing one or multiple wounds '[]'  - 0 Application of Medications - topical '[]'  - 0 Application of Medications - injection INTERVENTIONS - Miscellaneous '[]'  - 0 External ear exam '[]'  - 0 Specimen Collection (cultures, biopsies, blood, body fluids, etc.) '[]'  - 0 Specimen(s) / Culture(s) sent or taken to Lab for analysis '[]'  - 0 Patient Transfer (multiple staff / Civil Service fast streamer / Similar devices) '[]'  - 0 Simple Staple / Suture removal (25 or less) '[]'  - 0 Complex Staple / Suture removal (26 or more) '[]'  - 0 Hypo / Hyperglycemic Management (close monitor of Blood Glucose) '[]'  - 0 Ankle / Brachial Index (ABI) - do not check if billed separately X- 1 5 Vital Signs Has the patient been seen at the hospital within the last three years: Yes Total Score: 105 Level Of Care: New/Established - Level 3 Electronic Signature(s) Signed: 07/06/2020 3:14:58 PM By: Rhae Hammock RN Entered By: Rhae Hammock on 06/19/2020 14:59:45 -------------------------------------------------------------------------------- Encounter Discharge Information Details Patient Name: Date of Service: Jamie Boyden A. 06/19/2020 1:15 PM Medical Record Number: [id number] Patient Account Number: [id number] Date of Birth/Sex: Treating RN: 15-Dec-1980 (40 y.o. [name] Primary Care [name]: [name] Other Clinician: Referring  Neno Hohensee: Treating Mackenzie Groom/Extender: Rober Minion Weeks in Treatment: 207 Encounter Discharge Information Items Discharge Condition: Stable Ambulatory Status: Wheelchair Discharge Destination: Home Transportation: Private Auto Schedule Follow-up Appointment: Yes Clinical Summary of Care: Provided on 06/19/2020 Form Type Recipient Paper Patient Patient Electronic Signature(s) Signed: 06/19/2020 4:56:03 PM By: Lorrin Jackson Entered By: Lorrin Jackson on 06/19/2020 16:56:03 -------------------------------------------------------------------------------- Lower Extremity Assessment Details Patient Name: Date of Service: [name], [name] 06/19/2020 1:15 PM Medical Record Number: [id number] Patient Account Number: [id number] Date of Birth/Sex: Treating RN: 12-30-1980 (40 y.o. [name] Primary Care Karmen Altamirano: [name] Other Clinician: Referring Samona Chihuahua: Treating Barth Trella/Extender: Rober Minion Weeks in Treatment: 207 Electronic Signature(s) Signed: 06/20/2020 6:20:01 PM By: Baruch Gouty RN, BSN Entered By: Baruch Gouty on 06/19/2020 13:35:47 -------------------------------------------------------------------------------- Multi Wound Chart Details Patient Name: Date of Service: Jamie Boyden A. 06/19/2020 1:15 PM Medical Record Number: [id number] Patient Account Number: [id number] Date of Birth/Sex: Treating RN: 1981/02/07 (40 y.o. [name] Primary Care Stacy Deshler: [name] Other Clinician: Referring Marianita Botkin: Treating Abad Manard/Extender: Rober Minion Weeks in Treatment: 207 Vital Signs Height(in): 65 Pulse(bpm): 68 Weight(lbs): 201 Blood Pressure(mmHg): 141/93 Body Mass Index(BMI): 33 Temperature(F): 97.8 Respiratory Rate(breaths/min): 18 Photos: [1:No Photos Medial Sacrum] [N/A:N/A N/A] Wound Location: [1:Pressure Injury] [N/A:N/A] Wounding Event: [1:Pressure Ulcer]  [N/A:N/A] Primary Etiology: [1:Anemia, Osteomyelitis] [N/A:N/A] Comorbid History: [1:05/15/2016] [N/A:N/A] Date Acquired: [1:207] [N/A:N/A] Weeks of Treatment: [1:Open] [N/A:N/A] Wound Status: [1:1.2x0.8x1.8] [N/A:N/A] Measurements L x W x D (cm) [1:0.754] [N/A:N/A] A (cm) : rea [1:1.357] [N/A:N/A] Volume (cm) : [1:97.70%] [N/A:N/A] % Reduction in A rea: [1:99.10%] [N/A:N/A] % Reduction in Volume: [1:12] Starting Position 1 (o'clock): [1:4] Ending Position 1 (o'clock): [1:1.5] Maximum Distance 1 (cm): [1:Yes] [N/A:N/A] Undermining: [1:Category/Stage IV] [N/A:N/A] Classification: [1:Medium] [N/A:N/A] Exudate A mount: [1:Serosanguineous] [N/A:N/A] Exudate Type: [1:red, brown] [N/A:N/A] Exudate Color: [1:Well defined, not attached] [N/A:N/A] Wound Margin: [1:Large (67-100%)] [  N/A:N/A] Granulation A mount: [1:Red, Pink] [N/A:N/A] Granulation Quality: [1:None Present (0%)] [N/A:N/A] Necrotic A mount: [1:Fat Layer (Subcutaneous Tissue): Yes N/A] Exposed Structures: [1:Fascia: No Tendon: No Muscle: No Joint: No Bone: No Small (1-33%)] [N/A:N/A] Treatment Notes Electronic Signature(s) Signed: 06/19/2020 4:00:26 PM By: Kalman Shan DO Signed: 07/06/2020 3:14:58 PM By: Rhae Hammock RN Entered By: Kalman Shan on 06/19/2020 15:56:28 -------------------------------------------------------------------------------- Multi-Disciplinary Care Plan Details Patient Name: Date of Service: Jamie Boyden A. 06/19/2020 1:15 PM Medical Record Number: [id number] Patient Account Number: [id number] Date of Birth/Sex: Treating RN: 09-12-1980 (40 y.o. [name] Primary Care Davielle Lingelbach: [name] Other Clinician: Referring Rifka Ramey: Treating Charissa Knowles/Extender: Rober Minion Weeks in Treatment: [place] reviewed with physician Active Inactive Pressure Nursing Diagnoses: Knowledge deficit related to management of pressures  ulcers Potential for impaired tissue integrity related to pressure, friction, moisture, and shear Goals: Patient will remain free from development of additional pressure ulcers Date Initiated: 06/27/2016 Date Inactivated: 01/17/2019 Target Resolution Date: 12/24/2018 Goal Status: Met Patient/caregiver will verbalize risk factors for pressure ulcer development Date Initiated: 06/27/2016 Date Inactivated: 07/08/2017 Target Resolution Date: 07/08/2017 Goal Status: Met Patient/caregiver will verbalize understanding of pressure ulcer management Date Initiated: 06/27/2016 Target Resolution Date: 07/07/2020 Goal Status: Active Interventions: Assess: immobility, friction, shearing, incontinence upon admission and as needed Assess offloading mechanisms upon admission and as needed Assess potential for pressure ulcer upon admission and as needed Provide education on pressure ulcers Treatment Activities: Patient referred for pressure reduction/relief devices : 06/27/2016 Pressure reduction/relief device ordered : 06/27/2016 Notes: Wound/Skin Impairment Nursing Diagnoses: Impaired tissue integrity Knowledge deficit related to smoking impact on wound healing Knowledge deficit related to ulceration/compromised skin integrity Goals: Patient will demonstrate a reduced rate of smoking or cessation of smoking Date Initiated: 06/27/2016 Date Inactivated: 01/26/2017 Target Resolution Date: 01/08/2017 Goal Status: Unmet Unmet Reason: pt continues to smoke Patient/caregiver will verbalize understanding of skin care regimen Date Initiated: 06/27/2016 Target Resolution Date: 06/29/2020 Goal Status: Active Ulcer/skin breakdown will have a volume reduction of 50% by week 8 Date Initiated: 06/27/2016 Date Inactivated: 12/11/2016 Target Resolution Date: 07/25/2016 Goal Status: Met Interventions: Assess patient/caregiver ability to obtain necessary supplies Assess patient/caregiver ability to perform ulcer/skin  care regimen upon admission and as needed Assess ulceration(s) every visit Provide education on smoking Provide education on ulcer and skin care Treatment Activities: Skin care regimen initiated : 06/27/2016 Topical wound management initiated : 06/27/2016 Notes: Electronic Signature(s) Signed: 07/06/2020 3:14:58 PM By: Rhae Hammock RN Entered By: Rhae Hammock on 06/19/2020 14:40:54 -------------------------------------------------------------------------------- Pain Assessment Details Patient Name: Date of Service: [name], Jamie A. 06/19/2020 1:15 PM Medical Record Number: [id number] Patient Account Number: [id number] Date of Birth/Sex: Treating RN: 05/28/1980 (40 y.o. [name] Primary Care Carmel Waddington: [name] Other Clinician: Referring Kjuan Seipp: Treating Tovia Kisner/Extender: Rober Minion Weeks in Treatment: 207 Active Problems Location of Pain Severity and Description of Pain Patient Has Paino Yes Site Locations Pain Location: Generalized Pain With Dressing Change: No Rate the pain. Current Pain Level: 7 Worst Pain Level: 9 Least Pain Level: 4 Character of Pain Describe the Pain: Aching Pain Management and Medication Current Pain Management: Medication: Yes Is the Current Pain Management Adequate: Adequate How does your wound impact your activities of daily livingo Sleep: No Bathing: No Appetite: No Relationship With Others: No Bladder Continence: No Emotions: Yes Bowel Continence: No Hobbies: No Toileting: No Dressing: No Electronic Signature(s) Signed: 06/20/2020 6:20:01 PM By: Baruch Gouty RN, BSN Entered By: Baruch Gouty on 06/19/2020  13:35:39 -------------------------------------------------------------------------------- Patient/Caregiver Education Details Patient Name: Date of Service: [name], [name] 4/19/2022andnbsp1:15 PM Medical Record Number: [id number] Patient Account Number: [id number] Date  of Birth/Gender: Treating RN: 05/15/1980 (40 y.o. Benjaman Lobe Primary Care Physician: [name] Other Clinician: Referring Physician: Treating Physician/Extender: Marciano Sequin in Treatment: 207 Education Assessment Education Provided To: Patient Education Topics Provided Wound/Skin Impairment: Methods: Explain/Verbal Responses: State content correctly Electronic Signature(s) Signed: 07/06/2020 3:14:58 PM By: Rhae Hammock RN Entered By: Rhae Hammock on 06/19/2020 14:41:08 -------------------------------------------------------------------------------- Wound Assessment Details Patient Name: Date of Service: [name], Jamie A. 06/19/2020 1:15 PM Medical Record Number: [id number] Patient Account Number: [id number] Date of Birth/Sex: Treating RN: January 08, 1981 (40 y.o. [name] Primary Care Shayla Heming: [name] Other Clinician: Referring Sharice Harriss: Treating Juda Lajeunesse/Extender: Rober Minion Weeks in Treatment: 207 Wound Status Wound Number: 1 Primary Etiology: Pressure Ulcer Wound Location: Medial Sacrum Wound Status: Open Wounding Event: Pressure Injury Comorbid History: Anemia, Osteomyelitis Date Acquired: 05/15/2016 Weeks Of Treatment: 207 Clustered Wound: No Photos Wound Measurements Length: (cm) 1.2 Width: (cm) 0.8 Depth: (cm) 1.8 Area: (cm) 0.754 Volume: (cm) 1.357 % Reduction in Area: 97.7% % Reduction in Volume: 99.1% Epithelialization: Small (1-33%) Tunneling: No Undermining: Yes Starting Position (o'clock): 12 Ending Position (o'clock): 4 Maximum Distance: (cm) 1.5 Wound Description Classification: Category/Stage IV Wound Margin: Well defined, not attached Exudate Amount: Medium Exudate Type: Serosanguineous Exudate Color: red, brown Foul Odor After Cleansing: No Slough/Fibrino No Wound Bed Granulation Amount: Large (67-100%) Exposed Structure Granulation Quality: Red,  Pink Fascia Exposed: No Necrotic Amount: None Present (0%) Fat Layer (Subcutaneous Tissue) Exposed: Yes Tendon Exposed: No Muscle Exposed: No Joint Exposed: No Bone Exposed: No Treatment Notes Wound #1 (Sacrum) Wound Laterality: Medial Cleanser Peri-Wound Care Topical Primary Dressing Secondary Dressing Secured With Compression Wrap Compression Stockings Add-Ons Electronic Signature(s) Signed: 06/20/2020 10:33:47 AM By: Sandre Kitty Signed: 06/20/2020 6:20:01 PM By: Baruch Gouty RN, BSN Entered By: Sandre Kitty on 06/19/2020 17:00:50 -------------------------------------------------------------------------------- Vitals Details Patient Name: Date of Service: Jamie Boyden A. 06/19/2020 1:15 PM Medical Record Number: [id number] Patient Account Number: [id number] Date of Birth/Sex: Treating RN: 1980/12/14 (40 y.o. [name] Primary Care Jahmeek Shirk: [name] Other Clinician: Referring Danyetta Gillham: Treating Joleene Burnham/Extender: Rober Minion Weeks in Treatment: 207 Vital Signs Time Taken: 13:30 Temperature (F): 97.8 Height (in): 65 Pulse (bpm): 68 Source: Stated Respiratory Rate (breaths/min): 18 Weight (lbs): 201 Blood Pressure (mmHg): 141/93 Source: Stated Reference Range: 80 - 120 mg / dl Body Mass Index (BMI): 33.4 Electronic Signature(s) Signed: 06/20/2020 6:20:01 PM By: Baruch Gouty RN, BSN Entered By: Baruch Gouty on 06/19/2020 13:34:56

## 2020-07-17 ENCOUNTER — Other Ambulatory Visit: Payer: Self-pay

## 2020-07-17 ENCOUNTER — Encounter (HOSPITAL_BASED_OUTPATIENT_CLINIC_OR_DEPARTMENT_OTHER): Payer: Medicaid Other | Attending: Internal Medicine | Admitting: Internal Medicine

## 2020-07-17 DIAGNOSIS — L89154 Pressure ulcer of sacral region, stage 4: Secondary | ICD-10-CM | POA: Diagnosis present

## 2020-07-17 DIAGNOSIS — G8222 Paraplegia, incomplete: Secondary | ICD-10-CM | POA: Insufficient documentation

## 2020-07-17 NOTE — Progress Notes (Signed)
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 better with offloading from the sacrum 9/25oopatient was hospitalized from 9/6 through 9/11. She had strep sepsis. I think this was ultimately felt to be secondary to pyelonephritis. She did have [name] CT scan of the abdomen and pelvis. Noted there was bone destruction within the coccyx however this was present on [name] prior study which was felt to be stable when compared with the study from April 2020. Likely related to chronic osteomyelitis previously treated. 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 She was treated with 6 weeks of oral doxycycline by [name]. She already she also received IV antibiotics. We have been using silver alginate to the wound. Everything else is healed up except for the probing wound on the sacrum 11/16; patient is here after an extended hiatus again. She has the small probing wound on her sacrum which we have been using silver alginate . Since she was last here she was apparently had placement of [name] baclofen pump. Apparently the surgical site at the lower left lumbar area became infected she ended up in hospital from 11/6 through 11/7 with wound dehiscence and probable infection. Her surgeon Dr. Christella Noa open the wound debrided it took cultures and placed the wound VAC. She is now receiving IV antibiotics at the direction of infectious disease which I think is ertapenem for 20 days. 03/09/2019 on evaluation today patient appears to be doing quite well with regard to her wound. Its been since 01/17/2019 when we last saw her. She subsequently ended up in the hospital due to [name] baclofen pump which became infected. She has been on antibiotics as prescribed by infectious disease for this and she  was seeing Dr. Linus Salmons at [facility] for infectious disease. Subsequently she has been on doxycycline through November 29. The baclofen pump was placed on 12/22/2018 by Dr. Lovenia Shuck. Unfortunately the patient developed [name] wound infection and underwent debridement by Dr. Christella Noa on 01/08/2019. The growth in the culture revealed ESBL E. coli and diphtheroids and the patient was initially placed on ertapenem him in doxycycline for 3 weeks. Subsequently she comes in today for reevaluation and it does appear that her wound is actually doing significantly better even compared to last time we saw her. This is in regard to the medial sacral region. 2/5; I have not seen this wound in almost 3 months. Certainly has gotten better in terms of surface area although there is significant direct depth. She has completed antibiotics for the underlying osteomyelitis. 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 She has been using silver alginate. Her mother is changing the dressing. She claims to be offloading this area except for when she gets up to go to doctors appointments 3/12; this is [name] punched-out wound on the lower sacrum. Has [name] depth  of about 1.8 cm. It does not appear to undermine. There is no palpable bone. We have been using silver alginate packing her mother is changing the dressing she claims to be offloading this. She had [name] baclofen pump placed to alleviate her spasticity 5/17; the patient has not been seen in over 2 months. 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 Apparently they started to notice an odor and drainage earlier this month. Patient was admitted to [facility] from 5/3 through 07/12/2019. We do not have any records here. Apparently she was found to have pneumonia, [name] UTI. She also went underwent [name] surgical debridement of her lower sacral wound. She comes in today with [name] really  large postoperative wound. This does not have exposed bone but very close. This is right back to where this was [name] year or 2 ago. They have been using wet-to-dry. She is on an IV antibiotic but is not sure what drugs. We are not sure what home health company they are currently using. We are trying to get records from [facility]. 6/8; this the patient return to clinic 3 weeks ago. She had surgical debridement of the lower sacral wound. She apparently is completed her IV antibiotics although I am not exactly sure what IV antibiotics she had ordered. Her PICC line is come out. Her wound is large but generally clean there still is exposed bone. ooFortunately the area on the right heel is callused over I cannot prove there is an open area here per 6/22; they are using [name] wet to dry dressing when we left the clinic last time however they managed to find [name] box of silver alginate so they are using that with backing gauze. They are still changing this twice [name] day because of drainage issues. She has Medicaid and they will not be able to replace the want dressing she will have to go back to [name] wet-to-dry. In spite of this the wound actually looks quite good some improvement in dimension 7/13; using silver alginate. Slight improvement in overall wound volume including depth although there is still undermining. She tells me she is offloading this is much as possible 8/10-Patient back at 1 month, continuing to use silver alginate although she has run out and therefore using saline wet-to-dry, undermining is minimal, continuing attempts at offloading according to patient 9/21; its been about 9 weeks since I have seen this patient although I note she was seen once in August. Her wounds on the lower sacrum this looks [name] lot better than the last time I saw this. She is using silver alginate to the wound bed. They only have Medicaid therefore they have to need to pay out-of-pocket for the dressing supplies. This certainly  limits options. 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 10/19; 1 month follow-up. Not much change in the wound in fact it is measuring slightly larger. Still using silver alginate which her mother is purchasing off [employer] I believe. Since she was here she had [name] suprapubic catheter placed because of [name] lacerated urethra. 11/30; patient has not been here in about 6 weeks. She apparently has had 3 admissions to the hospital for pneumonia in the last 7 months 2 in the last 2 months. She came out of hospital this time with 4 L of oxygen. Her mother is using the Anasept wet-to-dry to  the sacral wound and surprisingly this looks somewhat better. The patient states she is pending 24/7 in bed except for going to doctor's appointments this may be helping. She has an appointment with pulmonology on 12/17. She was [name] heavy smoker for [name] period of time I wonder whether she has COPD 12/28; patient is doing well she is off her oxygen which may have something to do with chronic Macrobid she was on [hypersensitivity pneumonitis]. She is also stop smoking. In any case she is doing well from [name] breathing point of view. She is using Anasept wet-to-dry. 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 She has been using Anasept gel wet-to-dry and really has done [name] nice job here. She does not have insurance for other wound care products but truthfully so far this is done [name] really good job. I note she is back on oxygen. 2/22; stage IV wound on her lower sacrum. They have been to using an Anasept gel wet-to-dry-based dressing. Ordinarily I would like to use [name] moistened collagen wet-to-dry but she is apparently not able to afford this. There was also some suggestion about using Dakin's but apparently her mother could not make 3/14; 3-week follow-up. She is going for bladder surgery at [facility]  next week. Still using Anasept gel wet-to-dry. We will try to get Prisma wet to dry through what I think is [facility]. This probably will not work 4/19; 4-week follow-up. She is using collagen with wet-to-dry dressings every day. She reports improvement to the wound healing. Patient had bladder neck surgery with suprapubic catheter placement on 3/22. On 3/30 she was sent to the emergency department because of hypotension. She was found to be in septic shock in the setting of ESBL E. coli UTI. Currently she is doing well. 5/17; 4-week follow-up. She is using collagen with backing wet-to-dry. Really nice improvement in surface area of these wounds depth at 1.2 cm. She has had problems with urinary leakage. She does have [name] suprapubic catheter Objective Constitutional Sitting or standing Blood Pressure is within target range for patient.. Pulse regular and within target range for patient.[name] Respirations regular, non-labored and within target range.. Temperature is normal and within the target range for the patient.[name] Appears in no distress. Vitals Time Taken: 2:10 PM, Height: 65 in, Weight: 201 lbs, BMI: 33.4, Temperature: 98.2 F, Pulse: 80 bpm, Respiratory Rate: 18 breaths/min, Blood Pressure: 133/95 mmHg. General Notes: Wound exam; sacral ulcer. Under illumination the wound actually has come in dramatically. Much improved in terms of width. Direct depth at 1.2 cm no undermining. No evidence of infection Integumentary (Hair, Skin) Wound #1 status is Open. Original cause of wound was Pressure Injury. The date acquired was: 05/15/2016. The wound has been in treatment 211 weeks. The wound is located on the Medial Sacrum. The wound measures 1.2cm length x 0.6cm width x 1.5cm depth; 0.565cm^2 area and 0.848cm^3 volume. There is Fat Layer (Subcutaneous Tissue) exposed. There is no tunneling or undermining noted. There is [name] medium amount of serosanguineous drainage noted. The wound margin is well  defined and not attached to the wound base. There is large (67-100%) red, pink granulation within the wound bed. There is no necrotic tissue within the wound bed. Assessment Active Problems ICD-10 Pressure ulcer of sacral region, stage 4 Paraplegia, incomplete Plan 1. Continue with the silver collagen backing wet-to-dry. The wound is come down nicely. 2. 1.2 cm of direct depth no  palpable bone 3. Patient is not eligible for [name] wound VAC or advanced treatment products because of insurance issues 4. Her caregiver asked me about the surrounding tissue. What she has is new skin over [facility] I told her that this often happens with stage IV pressure ulcers over bonr especially if there is underlying infection Electronic Signature(s) Signed: 07/17/2020 4:16:43 PM By: Linton Ham MD Entered By: Linton Ham on 07/17/2020 14:34:16 -------------------------------------------------------------------------------- SuperBill Details Patient Name: Date of Service: Jamie Boyden [name]. 07/17/2020 Medical Record Number: [id number] Patient Account Number: [id number] Date of Birth/Sex: Treating RN: 1981/03/01 (40 y.o. F) Primary Care Provider: [name] Other Clinician: Referring Provider: Treating Provider/Extender: Mare Ferrari in Treatment: 211 Diagnosis Coding ICD-10 Codes Code Description L89.154 Pressure ulcer of sacral region, stage 4 G82.22 Paraplegia, incomplete Facility Procedures CPT4 Code: [id number] Description: 99213 - WOUND CARE VISIT-LEV 3 EST PT Modifier: Quantity: 1 Physician Procedures : CPT4 Code Description Modifier [id number] - WC PHYS LEVEL 3 - EST PT ICD-10 Diagnosis Description L89.154 Pressure ulcer of sacral region, stage 4 G82.22 Paraplegia, incomplete Quantity: 1 Electronic Signature(s) Signed: 07/17/2020 4:16:43 PM By: Linton Ham MD Signed: 07/17/2020 5:09:03 PM By: Rhae Hammock RN Entered By: Rhae Hammock on  07/17/2020 14:38:33

## 2020-07-18 ENCOUNTER — Telehealth: Payer: Self-pay | Admitting: Family Medicine

## 2020-07-18 DIAGNOSIS — G8929 Other chronic pain: Secondary | ICD-10-CM

## 2020-07-18 DIAGNOSIS — T8389XD Other specified complication of genitourinary prosthetic devices, implants and grafts, subsequent encounter: Secondary | ICD-10-CM

## 2020-07-18 DIAGNOSIS — L8945 Pressure ulcer of contiguous site of back, buttock and hip, unstageable: Secondary | ICD-10-CM

## 2020-07-18 DIAGNOSIS — M62838 Other muscle spasm: Secondary | ICD-10-CM

## 2020-07-18 DIAGNOSIS — S34101D Unspecified injury to L1 level of lumbar spinal cord, subsequent encounter: Secondary | ICD-10-CM

## 2020-07-18 DIAGNOSIS — N368 Other specified disorders of urethra: Secondary | ICD-10-CM

## 2020-07-18 DIAGNOSIS — T1490XS Injury, unspecified, sequela: Secondary | ICD-10-CM

## 2020-07-18 NOTE — Telephone Encounter (Signed)
Kathlee Nations at the [facility] needs a new order for physical therapy.  Patient's previous order is closed.

## 2020-07-18 NOTE — Telephone Encounter (Signed)
Please fax to [phone]

## 2020-07-18 NOTE — Progress Notes (Signed)
[name] ([id number]) Visit Report for 07/17/2020 Arrival Information Details Patient Name: Date of Service: [name], [name] 07/17/2020 1:15 PM Medical Record Number: [id number] Patient Account Number: [id number] Date of Birth/Sex: Treating RN: 04-30-1980 (40 y.o. F) Primary Care [name]: [name] Other Clinician: Referring [name]: Treating [name]/Extender: [name] in Treatment: 66 Visit Information History Since Last Visit Added or deleted any medications: No Patient Arrived: Wheel Chair Any new allergies or adverse reactions: No Arrival Time: 14:09 Had a fall or experienced change in No Accompanied By: caregiver activities of daily living that may affect Transfer Assistance: None risk of falls: Patient Identification Verified: Yes Signs or symptoms of abuse/neglect since last visito No Secondary Verification Process Completed: Yes Hospitalized since last visit: No Patient Requires Transmission-Based Precautions: No Implantable device outside of the clinic excluding No Patient Has Alerts: No cellular tissue based products placed in the center since last visit: Has Dressing in Place as Prescribed: Yes Pain Present Now: No Electronic Signature(s) Signed: 07/17/2020 3:36:19 PM By: [name] Entered By: [name] on 07/17/2020 14:10:11 -------------------------------------------------------------------------------- Clinic Level of Care Assessment Details Patient Name: Date of Service: [name] 07/17/2020 1:15 PM Medical Record Number: [id number] Patient Account Number: [id number] Date of Birth/Sex: Treating RN: [birth year] (40 y.o. [name], [name] Primary Care [name]: [name] Other Clinician: Referring [name]: Treating [name]/Extender: [name] in Treatment: 64 Clinic Level of Care Assessment Items TOOL 4 Quantity Score X- 1 0 Use when only an  EandM is performed on FOLLOW-UP visit ASSESSMENTS - Nursing Assessment / Reassessment X- 1 10 Reassessment of Co-morbidities (includes updates in patient status) X- 1 5 Reassessment of Adherence to Treatment Plan ASSESSMENTS - Wound and Skin A ssessment / Reassessment X - Simple Wound Assessment / Reassessment - one wound 1 5 '[]'  - 0 Complex Wound Assessment / Reassessment - multiple wounds '[]'  - 0 Dermatologic / Skin Assessment (not related to wound area) ASSESSMENTS - Focused Assessment '[]'  - 0 Circumferential Edema Measurements - multi extremities '[]'  - 0 Nutritional Assessment / Counseling / Intervention '[]'  - 0 Lower Extremity Assessment (monofilament, tuning fork, pulses) '[]'  - 0 Peripheral Arterial Disease Assessment (using hand held doppler) ASSESSMENTS - Ostomy and/or Continence Assessment and Care '[]'  - 0 Incontinence Assessment and Management '[]'  - 0 Ostomy Care Assessment and Management (repouching, etc.) PROCESS - Coordination of Care X - Simple Patient / Family Education for ongoing care 1 15 '[]'  - 0 Complex (extensive) Patient / Family Education for ongoing care X- 1 10 Staff obtains Programmer, systems, Records, T Results / Process Orders est X- 1 10 Staff telephones HHA, Nursing Homes / Clarify orders / etc '[]'  - 0 Routine Transfer to another Facility (non-emergent condition) '[]'  - 0 Routine Hospital Admission (non-emergent condition) '[]'  - 0 New Admissions / Biomedical engineer / Ordering NPWT Apligraf, etc. , '[]'  - 0 Emergency Hospital Admission (emergent condition) X- 1 10 Simple Discharge Coordination '[]'  - 0 Complex (extensive) Discharge Coordination PROCESS - Special Needs '[]'  - 0 Pediatric / Minor Patient Management '[]'  - 0 Isolation Patient Management '[]'  - 0 Hearing / Language / Visual special needs '[]'  - 0 Assessment of Community assistance (transportation, D/C planning, etc.) '[]'  - 0 Additional assistance / Altered mentation '[]'  - 0 Support Surface(s)  Assessment (bed, cushion, seat, etc.) INTERVENTIONS - Wound Cleansing / Measurement X - Simple Wound Cleansing - one wound 1 5 '[]'  - 0 Complex Wound Cleansing - multiple wounds X- 1 5  Wound Imaging (photographs - any number of wounds) '[]'  - 0 Wound Tracing (instead of photographs) X- 1 5 Simple Wound Measurement - one wound '[]'  - 0 Complex Wound Measurement - multiple wounds INTERVENTIONS - Wound Dressings X - Small Wound Dressing one or multiple wounds 1 10 '[]'  - 0 Medium Wound Dressing one or multiple wounds '[]'  - 0 Large Wound Dressing one or multiple wounds '[]'  - 0 Application of Medications - topical '[]'  - 0 Application of Medications - injection INTERVENTIONS - Miscellaneous '[]'  - 0 External ear exam '[]'  - 0 Specimen Collection (cultures, biopsies, blood, body fluids, etc.) '[]'  - 0 Specimen(s) / Culture(s) sent or taken to Lab for analysis '[]'  - 0 Patient Transfer (multiple staff / Civil Service fast streamer / Similar devices) '[]'  - 0 Simple Staple / Suture removal (25 or less) '[]'  - 0 Complex Staple / Suture removal (26 or more) X- 1 10 Hypo / Hyperglycemic Management (close monitor of Blood Glucose) '[]'  - 0 Ankle / Brachial Index (ABI) - do not check if billed separately X- 1 5 Vital Signs Has the patient been seen at the hospital within the last three years: Yes Total Score: 105 Level Of Care: New/Established - Level 3 Electronic Signature(s) Signed: 07/17/2020 5:09:03 PM By: Rhae Hammock RN Entered By: Rhae Hammock on 07/17/2020 14:38:28 -------------------------------------------------------------------------------- Encounter Discharge Information Details Patient Name: Date of Service: Jamie Boyden A. 07/17/2020 1:15 PM Medical Record Number: [id number] Patient Account Number: [id number] Date of Birth/Sex: Treating RN: 1980-08-31 (40 y.o. [name] Primary Care Famous Eisenhardt: [name] Other Clinician: Referring Kerryann Allaire: Treating Merlina Marchena/Extender: [name] in Treatment: 211 Encounter Discharge Information Items Discharge Condition: Stable Ambulatory Status: Wheelchair Discharge Destination: Home Transportation: Private Auto Accompanied By: Caregiver Schedule Follow-up Appointment: Yes Clinical Summary of Care: Provided on 07/17/2020 Form Type Recipient Paper Patient Patient Electronic Signature(s) Signed: 07/17/2020 3:26:09 PM By: Lorrin Jackson Entered By: Lorrin Jackson on 07/17/2020 15:26:09 -------------------------------------------------------------------------------- Lower Extremity Assessment Details Patient Name: Date of Service: [name], Jamie A. 07/17/2020 1:15 PM Medical Record Number: [id number] Patient Account Number: [id number] Date of Birth/Sex: Treating RN: May 14, 1980 (40 y.o. [name] Primary Care Rami Waddle: [name] Other Clinician: Referring Lovelee Forner: Treating Drianna Chandran/Extender: [name] in Treatment: 211 Electronic Signature(s) Signed: 07/17/2020 5:10:48 PM By: Lorrin Jackson Entered By: Lorrin Jackson on 07/17/2020 14:16:43 -------------------------------------------------------------------------------- Multi Wound Chart Details Patient Name: Date of Service: Jamie Boyden A. 07/17/2020 1:15 PM Medical Record Number: [id number] Patient Account Number: [id number] Date of Birth/Sex: Treating RN: 1980-12-25 (40 y.o. F) Primary Care Meg Niemeier: [name] Other Clinician: Referring Jushua Waltman: Treating Julanne Schlueter/Extender: [name] in Treatment: 211 Vital Signs Height(in): 65 Pulse(bpm): 80 Weight(lbs): 201 Blood Pressure(mmHg): 133/95 Body Mass Index(BMI): 33 Temperature(F): 98.2 Respiratory Rate(breaths/min): 18 Photos: [1:No Photos Medial Sacrum] [N/A:N/A N/A] Wound Location: [1:Pressure Injury] [N/A:N/A] Wounding Event: [1:Pressure Ulcer] [N/A:N/A] Primary Etiology: [1:Anemia, Osteomyelitis]  [N/A:N/A] Comorbid History: [1:05/15/2016] [N/A:N/A] Date Acquired: [1:211] [N/A:N/A] Weeks of Treatment: [1:Open] [N/A:N/A] Wound Status: [1:1.2x0.6x1.5] [N/A:N/A] Measurements L x W x D (cm) [1:0.565] [N/A:N/A] A (cm) : rea [1:0.848] [N/A:N/A] Volume (cm) : [1:98.30%] [N/A:N/A] % Reduction in A rea: [1:99.40%] [N/A:N/A] % Reduction in Volume: [1:Category/Stage IV] [N/A:N/A] Classification: [1:Medium] [N/A:N/A] Exudate A mount: [1:Serosanguineous] [N/A:N/A] Exudate Type: [1:red, brown] [N/A:N/A] Exudate Color: [1:Well defined, not attached] [N/A:N/A] Wound Margin: [1:Large (67-100%)] [N/A:N/A] Granulation A mount: [1:Red, Pink] [N/A:N/A] Granulation Quality: [1:None Present (0%)] [N/A:N/A] Necrotic A mount: [1:Fat Layer (Subcutaneous Tissue): Yes N/A] Exposed Structures: [  1:Fascia: No Tendon: No Muscle: No Joint: No Bone: No Small (1-33%)] [N/A:N/A] Treatment Notes Electronic Signature(s) Signed: 07/17/2020 4:16:43 PM By: Linton Ham MD Entered By: Linton Ham on 07/17/2020 14:31:01 -------------------------------------------------------------------------------- Multi-Disciplinary Care Plan Details Patient Name: Date of Service: [name], Jamie A. 07/17/2020 1:15 PM Medical Record Number: [id number] Patient Account Number: [id number] Date of Birth/Sex: Treating RN: 1980/06/26 (40 y.o. [name], [name] Primary Care Alecxander Mainwaring: [name] Other Clinician: Referring Jarad Barth: Treating Westyn Keatley/Extender: [name] in Treatment: [place] reviewed with physician Active Inactive Pressure Nursing Diagnoses: Knowledge deficit related to management of pressures ulcers Potential for impaired tissue integrity related to pressure, friction, moisture, and shear Goals: Patient will remain free from development of additional pressure ulcers Date Initiated: 06/27/2016 Date Inactivated: 01/17/2019 Target Resolution Date:  12/24/2018 Goal Status: Met Patient/caregiver will verbalize risk factors for pressure ulcer development Date Initiated: 06/27/2016 Date Inactivated: 07/08/2017 Target Resolution Date: 07/08/2017 Goal Status: Met Patient/caregiver will verbalize understanding of pressure ulcer management Date Initiated: 06/27/2016 Target Resolution Date: 07/28/2020 Goal Status: Active Interventions: Assess: immobility, friction, shearing, incontinence upon admission and as needed Assess offloading mechanisms upon admission and as needed Assess potential for pressure ulcer upon admission and as needed Provide education on pressure ulcers Treatment Activities: Patient referred for pressure reduction/relief devices : 06/27/2016 Pressure reduction/relief device ordered : 06/27/2016 Notes: Wound/Skin Impairment Nursing Diagnoses: Impaired tissue integrity Knowledge deficit related to smoking impact on wound healing Knowledge deficit related to ulceration/compromised skin integrity Goals: Patient will demonstrate a reduced rate of smoking or cessation of smoking Date Initiated: 06/27/2016 Date Inactivated: 01/26/2017 Target Resolution Date: 01/08/2017 Goal Status: Unmet Unmet Reason: pt continues to smoke Patient/caregiver will verbalize understanding of skin care regimen Date Initiated: 06/27/2016 Target Resolution Date: 07/28/2020 Goal Status: Active Ulcer/skin breakdown will have a volume reduction of 50% by week 8 Date Initiated: 06/27/2016 Date Inactivated: 12/11/2016 Target Resolution Date: 07/25/2016 Goal Status: Met Interventions: Assess patient/caregiver ability to obtain necessary supplies Assess patient/caregiver ability to perform ulcer/skin care regimen upon admission and as needed Assess ulceration(s) every visit Provide education on smoking Provide education on ulcer and skin care Treatment Activities: Skin care regimen initiated : 06/27/2016 Topical wound management initiated :  06/27/2016 Notes: Electronic Signature(s) Signed: 07/17/2020 5:09:03 PM By: Rhae Hammock RN Entered By: Rhae Hammock on 07/17/2020 14:36:02 -------------------------------------------------------------------------------- Pain Assessment Details Patient Name: Date of Service: [name], Jamie A. 07/17/2020 1:15 PM Medical Record Number: [id number] Patient Account Number: [id number] Date of Birth/Sex: Treating RN: 11/05/1980 (40 y.o. F) Primary Care Tachina Spoonemore: [name] Other Clinician: Referring Clearence Vitug: Treating Alexcia Schools/Extender: [name] in Treatment: 211 Active Problems Location of Pain Severity and Description of Pain Patient Has Paino No Site Locations Pain Management and Medication Current Pain Management: Electronic Signature(s) Signed: 07/17/2020 3:36:19 PM By: [name] Entered By: [name] on 07/17/2020 14:10:38 -------------------------------------------------------------------------------- Patient/Caregiver Education Details Patient Name: Date of Service: [name] 5/17/2022andnbsp1:15 PM Medical Record Number: [id number] Patient Account Number: [id number] Date of Birth/Gender: Treating RN: October 21, 1980 (40 y.o. Benjaman Lobe Primary Care Physician: [name] Other Clinician: Referring Physician: Treating Physician/Extender: [name] in Treatment: 211 Education Assessment Education Provided To: Patient Education Topics Provided Basic Hygiene: Methods: Explain/Verbal Electronic Signature(s) Signed: 07/17/2020 5:09:03 PM By: Rhae Hammock RN Entered By: Rhae Hammock on 07/17/2020 14:37:44 -------------------------------------------------------------------------------- Wound Assessment Details Patient Name: Date of Service: [name], [name] 07/17/2020 1:15 PM Medical Record Number: [id number] Patient Account Number: [id number] Date  of  Birth/Sex: Treating RN: 07-Sep-1980 (40 y.o. F) Primary Care Joseantonio Dittmar: [name] Other Clinician: Referring Caylyn Tedeschi: Treating Carlin Attridge/Extender: [name] in Treatment: 211 Wound Status Wound Number: 1 Primary Etiology: Pressure Ulcer Wound Location: Medial Sacrum Wound Status: Open Wounding Event: Pressure Injury Comorbid History: Anemia, Osteomyelitis Date Acquired: 05/15/2016 Weeks Of Treatment: 211 Clustered Wound: No Photos Wound Measurements Length: (cm) 1.2 Width: (cm) 0.6 Depth: (cm) 1.5 Area: (cm) 0.565 Volume: (cm) 0.848 % Reduction in Area: 98.3% % Reduction in Volume: 99.4% Epithelialization: Small (1-33%) Tunneling: No Undermining: No Wound Description Classification: Category/Stage IV Wound Margin: Well defined, not attached Exudate Amount: Medium Exudate Type: Serosanguineous Exudate Color: red, brown Foul Odor After Cleansing: No Slough/Fibrino No Wound Bed Granulation Amount: Large (67-100%) Exposed Structure Granulation Quality: Red, Pink Fascia Exposed: No Necrotic Amount: None Present (0%) Fat Layer (Subcutaneous Tissue) Exposed: Yes Tendon Exposed: No Muscle Exposed: No Joint Exposed: No Bone Exposed: No Treatment Notes Wound #1 (Sacrum) Wound Laterality: Medial Cleanser Normal Saline Discharge Instruction: Cleanse the wound with Normal Saline prior to applying a clean dressing using gauze sponges, not tissue or cotton balls. Peri-Wound Care Topical Primary Dressing FIBRACOL Plus Dressing, 2x2 in (collagen) Discharge Instruction: Moisten collagen with saline or hydrogel Secondary Dressing Woven Gauze Sponge, Non-Sterile 4x4 in Discharge Instruction: Apply over primary dressing as directed. ABD Pad, 5x9 Discharge Instruction: Apply over primary dressing as directed. Secured With 61M [place] Surgical T ape, 2x2 (in/yd) Discharge Instruction: Secure dressing with tape as  directed. Compression Wrap Compression Stockings Add-Ons Electronic Signature(s) Signed: 07/18/2020 11:07:31 AM By: [name] Entered By: [name] on 07/17/2020 16:23:48 -------------------------------------------------------------------------------- Vitals Details Patient Name: Date of Service: Jamie Boyden A. 07/17/2020 1:15 PM Medical Record Number: [id number] Patient Account Number: [id number] Date of Birth/Sex: Treating RN: 08/16/1980 (40 y.o. F) Primary Care Denzil Bristol: [name] Other Clinician: Referring Lyden Redner: Treating Adonias Demore/Extender: [name] in Treatment: 211 Vital Signs Time Taken: 14:10 Temperature (F): 98.2 Height (in): 65 Pulse (bpm): 80 Weight (lbs): 201 Respiratory Rate (breaths/min): 18 Body Mass Index (BMI): 33.4 Blood Pressure (mmHg): 133/95 Reference Range: 80 - 120 mg / dl Electronic Signature(s) Signed: 07/17/2020 3:36:19 PM By: [name] Entered By: [name] on 07/17/2020 14:10:30

## 2020-07-19 NOTE — Telephone Encounter (Signed)
Referral placed.

## 2020-08-14 ENCOUNTER — Encounter (HOSPITAL_BASED_OUTPATIENT_CLINIC_OR_DEPARTMENT_OTHER): Payer: Medicaid Other | Attending: Internal Medicine | Admitting: Internal Medicine

## 2020-08-14 ENCOUNTER — Other Ambulatory Visit: Payer: Self-pay

## 2020-08-14 DIAGNOSIS — G8222 Paraplegia, incomplete: Secondary | ICD-10-CM | POA: Insufficient documentation

## 2020-08-14 DIAGNOSIS — L89154 Pressure ulcer of sacral region, stage 4: Secondary | ICD-10-CM | POA: Insufficient documentation

## 2020-08-14 NOTE — Progress Notes (Signed)
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 healed up except for the probing wound on the sacrum 11/16; patient is here after an extended hiatus again. She has the small probing wound on her sacrum which  we have been using silver alginate . Since she was last here she was apparently had placement of a baclofen pump. Apparently the surgical site at the lower left lumbar area became infected she ended up in hospital from 11/6 through 11/7 with wound dehiscence and probable infection. Her surgeon Dr. Christella Noa open the wound debrided it took cultures and placed the wound VAC. She is now receiving IV antibiotics at the direction of infectious disease which I think is ertapenem for 20 days. 03/09/2019 on evaluation today patient appears to be doing quite well with regard to her wound. Its been since 01/17/2019 when we last saw her. She subsequently ended up in the hospital due to a baclofen pump which became infected. She has been on antibiotics as prescribed by infectious disease for this and she was seeing Dr. Linus Salmons at [facility] for infectious disease. Subsequently she has been on doxycycline through November 29. The baclofen pump was placed on 12/22/2018 by Dr. Lovenia Shuck. Unfortunately the patient developed a wound infection and underwent debridement by Dr. Christella Noa on 01/08/2019. The growth in the culture revealed ESBL E. coli and diphtheroids  and the patient was initially placed on ertapenem him in doxycycline for 3 weeks. Subsequently she comes in today for reevaluation and it does appear that her wound is actually doing significantly better even compared to last time we saw her. This is in regard to the medial sacral region. 2/5; I have not seen this wound in almost 3 months. Certainly has gotten better in terms of surface area although there is significant direct depth. She has completed antibiotics for the underlying osteomyelitis. 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 She has been using silver alginate. Her mother is changing the dressing. She claims to be offloading this area except for when she gets up to go to doctors appointments 3/12; this is a  punched-out wound on the lower sacrum. Has a depth of about 1.8 cm. It does not appear to undermine. There is no palpable bone. We have been using silver alginate packing her mother is changing the dressing she claims to be offloading this. She had a baclofen pump placed to alleviate her spasticity 5/17; the patient has not been seen in over 2 months. 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 Apparently they started to notice an odor and drainage earlier this month. Patient was admitted to [facility] from 5/3 through 07/12/2019. We do not have any records here. Apparently she was found to have pneumonia, a UTI. She also went underwent a surgical debridement of her lower sacral wound. She comes in today with a really large postoperative wound. This does not have exposed bone but very close. This is right back to where this was a year or 2 ago. They have been using wet-to-dry. She is on an IV antibiotic but is not sure what drugs. We are not sure what home health company they are currently using. We are trying to get records from [facility]. 6/8; this the patient return to clinic 3 weeks ago. She had surgical debridement of the lower sacral wound. She apparently is completed her IV antibiotics although I am not exactly sure what IV antibiotics she had ordered. Her PICC line is come out. Her wound is large but generally clean there still is exposed bone. ooFortunately the area on the right heel is callused over I cannot prove there is an open area here per 6/22; they are using a wet to dry dressing when we left the clinic last time however they managed to find a box of silver alginate so they are using that with backing gauze. They are still changing this twice a day because of drainage issues. She has Medicaid and they will not be able to replace the want dressing she will have to go back to a wet-to-dry. In spite of this the wound  actually looks quite good some improvement in dimension 7/13; using silver alginate. Slight improvement in overall wound volume including depth although there is still undermining. She tells me she is offloading this is much as possible 8/10-Patient back at 1 month, continuing to use silver alginate although she has run out and therefore using saline wet-to-dry, undermining is minimal, continuing attempts at offloading according to patient 9/21; its been about 9 weeks since I have seen this patient although I note she was seen once in August. Her wounds on the lower sacrum this looks a lot better than the last time I saw this. She is  using silver alginate to the wound bed. They only have Medicaid therefore they have to need to pay out-of-pocket for the dressing supplies. This certainly limits options. 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 10/19; 1 month follow-up. Not much change in the wound in fact it is measuring slightly larger. Still using silver alginate which her mother is purchasing off [employer] I believe. Since she was here she had a suprapubic catheter placed because of a lacerated urethra. 11/30; patient has not been here in about 6 weeks. She apparently has had 3 admissions to the hospital for pneumonia in the last 7 months 2 in the last 2 months. She came out of hospital this time with 4 L of oxygen. Her mother is using the Anasept wet-to-dry to the sacral wound and surprisingly this looks somewhat better. The patient states she is pending 24/7 in bed except for going to doctor's appointments this may be helping. She has an appointment with pulmonology on 12/17. She was a heavy smoker for a period of time I wonder whether she has COPD 12/28; patient is doing well she is off her oxygen which may have something to do with chronic Macrobid she was on [hypersensitivity pneumonitis]. She is also stop smoking. In  any case she is doing well from a breathing point of view. She is using Anasept wet-to-dry. 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 She has been using Anasept gel wet-to-dry and really has done a nice job here. She does not have insurance for other wound care products but truthfully so far this is done a really good job. I note she is back on oxygen. 2/22; stage IV wound on her lower sacrum. They have been to using an Anasept gel wet-to-dry-based dressing. Ordinarily I would like to use a moistened collagen wet-to-dry but she is apparently not able to afford this. There was also some suggestion about using Dakin's but apparently her mother could not make 3/14; 3-week follow-up. She is going for bladder surgery at [facility] next week. Still using Anasept gel wet-to-dry. We will try to get Prisma wet to dry through what I think is [facility]. This probably will not work 4/19; 4-week follow-up. She is using collagen with wet-to-dry dressings every day. She reports improvement to the wound healing. Patient had bladder neck surgery with suprapubic catheter placement on 3/22. On 3/30 she was sent to the emergency department because of hypotension. She was found to be in septic shock in the setting of ESBL E. coli UTI. Currently she is doing well. 5/17; 4-week follow-up. She is using collagen with backing wet-to-dry. Really nice improvement in surface area of these wounds depth at 1.2 cm. She has had problems with urinary leakage. She does have a suprapubic catheter 6/14; 4-week follow-up. She was doing really well the last time we saw her in the wound had filled in quite a bit. However since that time her wound is gotten a lot deeper. Apparently her bladder surgery failed and she is incontinent a lot of the time. Furthermore her mother suffered a stroke and is staying with her sister. She now has care attendance but I am not really  certain about the amount of care she has. We have been using silver collagen but apparently the family has to buy this privately. Objective Constitutional Patient is hypertensive.. Pulse regular and  within target range for patient.[name] Respirations regular, non-labored and within target range.. Temperature is normal and within the target range for the patient.[name] Appears in no distress. Vitals Time Taken: 1:14 PM, Height: 65 in, Weight: 201 lbs, BMI: 33.4, Temperature: 98.4 F, Pulse: 90 bpm, Respiratory Rate: 18 breaths/min, Blood Pressure: 162/89 mmHg. General Notes: Wound exam; sacral ulcer. Under illumination there is a lot more depth than last time in fact this goes very close to bone 2 cm. In spite of this I see no evidence of infection there is no erythema no purulent drainage. Integumentary (Hair, Skin) Wound #1 status is Open. Original cause of wound was Pressure Injury. The date acquired was: 05/15/2016. The wound has been in treatment 215 weeks. The wound is located on the Medial Sacrum. The wound measures 1.7cm length x 0.9cm width x 2cm depth; 1.202cm^2 area and 2.403cm^3 volume. There is Fat Layer (Subcutaneous Tissue) exposed. There is no tunneling or undermining noted. There is a medium amount of serosanguineous drainage noted. The wound margin is well defined and not attached to the wound base. There is large (67-100%) pink granulation within the wound bed. There is no necrotic tissue within the wound bed. General Notes: Slight maceration Assessment Active Problems ICD-10 Pressure ulcer of sacral region, stage 4 Paraplegia, incomplete Plan Follow-up Appointments: Return Appointment in 2 weeks. - Dr. Dellia Nims!!!! **Hoyer** Bathing/ Shower/ Hygiene: May shower with protection but do not get wound dressing(s) wet. Off-Loading: Turn and reposition every 2 hours WOUND #1: - Sacrum Wound Laterality: Medial Cleanser: Soap and Water 2 x Per Day/30 Days Discharge Instructions: May  shower and wash wound with dial antibacterial soap and water prior to dressing change. Cleanser: Wound Cleanser (DME) (Generic) 2 x Per Day/30 Days Discharge Instructions: Cleanse the wound with wound cleanser prior to applying a clean dressing using gauze sponges, not tissue or cotton balls. Prim Dressing: KerraCel Ag Gelling Fiber Dressing, 4x5 in (silver alginate) (DME) (Generic) 2 x Per Day/30 Days ary Discharge Instructions: Pack silver alginate to wound bed lightly. Secondary Dressing: Woven Gauze Sponge, Non-Sterile 4x4 in (DME) (Generic) 2 x Per Day/30 Days Discharge Instructions: Apply over primary dressing as directed. Secondary Dressing: ABD Pad, 5x9 (DME) (Generic) 2 x Per Day/30 Days Discharge Instructions: Apply over primary dressing as directed. Secured With: 70M Medipore H Soft Cloth Surgical T ape, 2x2 (in/yd) (DME) (Generic) 2 x Per Day/30 Days Discharge Instructions: Secure dressing with tape as directed. 1. This is a deep tunneling wound again. It goes precariously close to bone although I could not convince myself I could feel any 2. Not sure how much of this deterioration is secondary to her incontinence and/or her change in social status/illness of her mom. I did not get into how much care she has she came with an attendant today 3. I change the dressing to silver alginate there are not a lot of options here. She is not eligible for a wound VAC Electronic Signature(s) Signed: 08/14/2020 4:29:11 PM By: Linton Ham MD Entered By: Linton Ham on 08/14/2020 13:57:37 -------------------------------------------------------------------------------- SuperBill Details Patient Name: Date of Service: Jamie Boyden A. 08/14/2020 Medical Record Number: [id number] Patient Account Number: [id number] Date of Birth/Sex: Treating RN: [birth year] (40 y.o. [name], [name] Primary Care Provider: [name] Other Clinician: Referring Provider: Treating  Provider/Extender: Mare Ferrari in Treatment: 215 Diagnosis Coding ICD-10 Codes Code Description L89.154 Pressure ulcer of sacral region, stage 4 G82.22 Paraplegia, incomplete Physician Procedures : CPT4 Code Description Modifier [id number]  WC PHYS LEVEL 3 - EST PT ICD-10 Diagnosis Description L89.154 Pressure ulcer of sacral region, stage 4 Quantity: 1 Electronic Signature(s) Signed: 08/14/2020 4:29:11 PM By: Linton Ham MD Entered By: Linton Ham on 08/14/2020 13:57:53

## 2020-08-15 ENCOUNTER — Other Ambulatory Visit: Payer: Self-pay | Admitting: Family Medicine

## 2020-08-15 DIAGNOSIS — E038 Other specified hypothyroidism: Secondary | ICD-10-CM

## 2020-08-15 DIAGNOSIS — F339 Major depressive disorder, recurrent, unspecified: Secondary | ICD-10-CM

## 2020-08-15 DIAGNOSIS — F411 Generalized anxiety disorder: Secondary | ICD-10-CM

## 2020-08-15 NOTE — Progress Notes (Signed)
[name] ([id number]) Visit Report for 08/14/2020 Arrival Information Details Patient Name: Date of Service: [name], [name] 08/14/2020 1:15 PM Medical Record Number: [id number] Patient Account Number: [id number] Date of Birth/Sex: Treating RN: 30-Sep-1980 (40 y.o. [name], [name] Primary Care Rakiya Krawczyk: [name] Other Clinician: Referring Dallam Carmack: Treating Shir Bergman/Extender: Mare Ferrari in Treatment: 215 Visit Information History Since Last Visit Added or deleted any medications: No Patient Arrived: Wheel Chair Any new allergies or adverse reactions: No Arrival Time: 13:13 Had a fall or experienced change in No Accompanied By: caregiver activities of daily living that may affect Transfer Assistance: [name] Lift risk of falls: Patient Identification Verified: Yes Signs or symptoms of abuse/neglect since last visito No Secondary Verification Process Completed: Yes Hospitalized since last visit: No Patient Requires Transmission-Based Precautions: No Implantable device outside of the clinic excluding No Patient Has Alerts: No cellular tissue based products placed in the center since last visit: Has Dressing in Place as Prescribed: Yes Pain Present Now: Yes Electronic Signature(s) Signed: 08/14/2020 4:42:14 PM By: Sandre Kitty Entered By: Sandre Kitty on 08/14/2020 13:14:07 -------------------------------------------------------------------------------- Clinic Level of Care Assessment Details Patient Name: Date of Service: [name], [name] 08/14/2020 1:15 PM Medical Record Number: [id number] Patient Account Number: [id number] Date of Birth/Sex: Treating RN: [birth year] (40 y.o. Helene Shoe, Tammi Klippel Primary Care Mirelle Biskup: [name] Other Clinician: Referring Elchonon Maxson: Treating Maahi Lannan/Extender: Mare Ferrari in Treatment: 215 Clinic Level of Care Assessment Items TOOL 4 Quantity Score X- 1  0 Use when only an EandM is performed on FOLLOW-UP visit ASSESSMENTS - Nursing Assessment / Reassessment X- 1 10 Reassessment of Co-morbidities (includes updates in patient status) X- 1 5 Reassessment of Adherence to Treatment Plan ASSESSMENTS - Wound and Skin A ssessment / Reassessment '[]'  - 0 Simple Wound Assessment / Reassessment - one wound X- 1 5 Complex Wound Assessment / Reassessment - multiple wounds X- 1 10 Dermatologic / Skin Assessment (not related to wound area) ASSESSMENTS - Focused Assessment '[]'  - 0 Circumferential Edema Measurements - multi extremities X- 1 10 Nutritional Assessment / Counseling / Intervention '[]'  - 0 Lower Extremity Assessment (monofilament, tuning fork, pulses) '[]'  - 0 Peripheral Arterial Disease Assessment (using hand held doppler) ASSESSMENTS - Ostomy and/or Continence Assessment and Care '[]'  - 0 Incontinence Assessment and Management '[]'  - 0 Ostomy Care Assessment and Management (repouching, etc.) PROCESS - Coordination of Care '[]'  - 0 Simple Patient / Family Education for ongoing care X- 1 20 Complex (extensive) Patient / Family Education for ongoing care X- 1 10 Staff obtains Programmer, systems, Records, T Results / Process Orders est '[]'  - 0 Staff telephones HHA, Nursing Homes / Clarify orders / etc '[]'  - 0 Routine Transfer to another Facility (non-emergent condition) '[]'  - 0 Routine Hospital Admission (non-emergent condition) '[]'  - 0 New Admissions / Biomedical engineer / Ordering NPWT Apligraf, etc. , '[]'  - 0 Emergency Hospital Admission (emergent condition) '[]'  - 0 Simple Discharge Coordination X- 1 15 Complex (extensive) Discharge Coordination PROCESS - Special Needs '[]'  - 0 Pediatric / Minor Patient Management '[]'  - 0 Isolation Patient Management '[]'  - 0 Hearing / Language / Visual special needs '[]'  - 0 Assessment of Community assistance (transportation, D/C planning, etc.) '[]'  - 0 Additional assistance / Altered mentation '[]'  -  0 Support Surface(s) Assessment (bed, cushion, seat, etc.) INTERVENTIONS - Wound Cleansing / Measurement '[]'  - 0 Simple Wound Cleansing - one wound X- 1 5 Complex Wound Cleansing - multiple wounds X- 1 5  Wound Imaging (photographs - any number of wounds) '[]'  - 0 Wound Tracing (instead of photographs) '[]'  - 0 Simple Wound Measurement - one wound X- 1 5 Complex Wound Measurement - multiple wounds INTERVENTIONS - Wound Dressings '[]'  - 0 Small Wound Dressing one or multiple wounds X- 1 15 Medium Wound Dressing one or multiple wounds '[]'  - 0 Large Wound Dressing one or multiple wounds '[]'  - 0 Application of Medications - topical '[]'  - 0 Application of Medications - injection INTERVENTIONS - Miscellaneous '[]'  - 0 External ear exam '[]'  - 0 Specimen Collection (cultures, biopsies, blood, body fluids, etc.) '[]'  - 0 Specimen(s) / Culture(s) sent or taken to Lab for analysis '[]'  - 0 Patient Transfer (multiple staff / Civil Service fast streamer / Similar devices) '[]'  - 0 Simple Staple / Suture removal (25 or less) '[]'  - 0 Complex Staple / Suture removal (26 or more) '[]'  - 0 Hypo / Hyperglycemic Management (close monitor of Blood Glucose) '[]'  - 0 Ankle / Brachial Index (ABI) - do not check if billed separately X- 1 5 Vital Signs Has the patient been seen at the hospital within the last three years: Yes Total Score: 120 Level Of Care: New/Established - Level 4 Electronic Signature(s) Signed: 08/14/2020 6:05:55 PM By: Deon Pilling Entered By: Deon Pilling on 08/14/2020 15:43:35 -------------------------------------------------------------------------------- Encounter Discharge Information Details Patient Name: Date of Service: Jamie Boyden A. 08/14/2020 1:15 PM Medical Record Number: [id number] Patient Account Number: [id number] Date of Birth/Sex: Treating RN: 1980/11/07 (40 y.o. [name] Primary Care Oz Gammel: [name] Other Clinician: Referring Excell Neyland: Treating Giulian Goldring/Extender:  Mare Ferrari in Treatment: 215 Encounter Discharge Information Items Discharge Condition: Stable Ambulatory Status: Wheelchair Discharge Destination: Home Transportation: Private Auto Schedule Follow-up Appointment: Yes Clinical Summary of Care: Provided on 08/14/2020 Form Type Recipient Paper Patient Patient Electronic Signature(s) Signed: 08/14/2020 1:36:48 PM By: Lorrin Jackson Entered By: Lorrin Jackson on 08/14/2020 13:36:47 -------------------------------------------------------------------------------- Lower Extremity Assessment Details Patient Name: Date of Service: [name], Jamie A. 08/14/2020 1:15 PM Medical Record Number: [id number] Patient Account Number: [id number] Date of Birth/Sex: Treating RN: March 25, 1980 (40 y.o. [name] Primary Care Brason Berthelot: [name] Other Clinician: Referring Chiann Goffredo: Treating Yogesh Cominsky/Extender: Mare Ferrari in Treatment: 215 Electronic Signature(s) Signed: 08/14/2020 1:59:21 PM By: Lorrin Jackson Entered By: Lorrin Jackson on 08/14/2020 13:23:45 -------------------------------------------------------------------------------- Multi Wound Chart Details Patient Name: Date of Service: Jamie Boyden A. 08/14/2020 1:15 PM Medical Record Number: [id number] Patient Account Number: [id number] Date of Birth/Sex: Treating RN: Dec 15, 1980 (40 y.o. [name], [name] Primary Care Izaias Krupka: [name] Other Clinician: Referring Bernell Sigal: Treating Demiya Magno/Extender: Mare Ferrari in Treatment: 215 Vital Signs Height(in): 65 Pulse(bpm): 90 Weight(lbs): 201 Blood Pressure(mmHg): 162/89 Body Mass Index(BMI): 33 Temperature(F): 98.4 Respiratory Rate(breaths/min): 18 Photos: [1:No Photos Medial Sacrum] [N/A:N/A N/A] Wound Location: [1:Pressure Injury] [N/A:N/A] Wounding Event: [1:Pressure Ulcer] [N/A:N/A] Primary Etiology: [1:Anemia, Osteomyelitis]  [N/A:N/A] Comorbid History: [1:05/15/2016] [N/A:N/A] Date Acquired: [1:215] [N/A:N/A] Weeks of Treatment: [1:Open] [N/A:N/A] Wound Status: [1:1.7x0.9x2] [N/A:N/A] Measurements L x W x D (cm) [1:1.202] [N/A:N/A] A (cm) : rea [1:2.403] [N/A:N/A] Volume (cm) : [1:96.30%] [N/A:N/A] % Reduction in A rea: [1:98.30%] [N/A:N/A] % Reduction in Volume: [1:Category/Stage IV] [N/A:N/A] Classification: [1:Medium] [N/A:N/A] Exudate A mount: [1:Serosanguineous] [N/A:N/A] Exudate Type: [1:red, brown] [N/A:N/A] Exudate Color: [1:Well defined, not attached] [N/A:N/A] Wound Margin: [1:Large (67-100%)] [N/A:N/A] Granulation A mount: [1:Pink] [N/A:N/A] Granulation Quality: [1:None Present (0%)] [N/A:N/A] Necrotic A mount: [1:Fat Layer (Subcutaneous Tissue): Yes N/A] Exposed Structures: [1:Fascia: No Tendon: No  Muscle: No Joint: No Bone: No Medium (34-66%)] [N/A:N/A] Epithelialization: [1:Slight maceration] [N/A:N/A] Treatment Notes Electronic Signature(s) Signed: 08/14/2020 4:29:11 PM By: Linton Ham MD Signed: 08/15/2020 6:22:58 PM By: Rhae Hammock RN Entered By: Linton Ham on 08/14/2020 13:45:44 -------------------------------------------------------------------------------- Multi-Disciplinary Care Plan Details Patient Name: Date of Service: [name], Jamie A. 08/14/2020 1:15 PM Medical Record Number: [id number] Patient Account Number: [id number] Date of Birth/Sex: Treating RN: Jun 15, 1980 (40 y.o. Helene Shoe, Tammi Klippel Primary Care Ylonda Storr: [name] Other Clinician: Referring Ramsay Bognar: Treating Fairley Copher/Extender: Mare Ferrari in Treatment: Bivalve reviewed with physician Active Inactive Pressure Nursing Diagnoses: Knowledge deficit related to management of pressures ulcers Potential for impaired tissue integrity related to pressure, friction, moisture, and shear Goals: Patient will remain free from development of additional  pressure ulcers Date Initiated: 06/27/2016 Date Inactivated: 01/17/2019 Target Resolution Date: 12/24/2018 Goal Status: Met Patient/caregiver will verbalize risk factors for pressure ulcer development Date Initiated: 06/27/2016 Date Inactivated: 07/08/2017 Target Resolution Date: 07/08/2017 Goal Status: Met Patient/caregiver will verbalize understanding of pressure ulcer management Date Initiated: 06/27/2016 Target Resolution Date: 08/31/2020 Goal Status: Active Interventions: Assess: immobility, friction, shearing, incontinence upon admission and as needed Assess offloading mechanisms upon admission and as needed Assess potential for pressure ulcer upon admission and as needed Provide education on pressure ulcers Treatment Activities: Patient referred for pressure reduction/relief devices : 06/27/2016 Pressure reduction/relief device ordered : 06/27/2016 Notes: Wound/Skin Impairment Nursing Diagnoses: Impaired tissue integrity Knowledge deficit related to smoking impact on wound healing Knowledge deficit related to ulceration/compromised skin integrity Goals: Patient will demonstrate a reduced rate of smoking or cessation of smoking Date Initiated: 06/27/2016 Date Inactivated: 01/26/2017 Target Resolution Date: 01/08/2017 Goal Status: Unmet Unmet Reason: pt continues to smoke Patient/caregiver will verbalize understanding of skin care regimen Date Initiated: 06/27/2016 Target Resolution Date: 08/31/2020 Goal Status: Active Ulcer/skin breakdown will have a volume reduction of 50% by week 8 Date Initiated: 06/27/2016 Date Inactivated: 12/11/2016 Target Resolution Date: 07/25/2016 Goal Status: Met Interventions: Assess patient/caregiver ability to obtain necessary supplies Assess patient/caregiver ability to perform ulcer/skin care regimen upon admission and as needed Assess ulceration(s) every visit Provide education on smoking Provide education on ulcer and skin care Treatment  Activities: Skin care regimen initiated : 06/27/2016 Topical wound management initiated : 06/27/2016 Notes: Electronic Signature(s) Signed: 08/14/2020 6:05:55 PM By: Deon Pilling Entered By: Deon Pilling on 08/14/2020 13:35:31 -------------------------------------------------------------------------------- Pain Assessment Details Patient Name: Date of Service: [name], [name] 08/14/2020 1:15 PM Medical Record Number: [id number] Patient Account Number: [id number] Date of Birth/Sex: Treating RN: October 05, 1980 (40 y.o. [name], [name] Primary Care Najeh Credit: [name] Other Clinician: Referring Chrishana Spargur: Treating Nhyla Nappi/Extender: Mare Ferrari in Treatment: 215 Active Problems Location of Pain Severity and Description of Pain Patient Has Paino Yes Site Locations Rate the pain. Current Pain Level: 4 Pain Management and Medication Current Pain Management: Electronic Signature(s) Signed: 08/14/2020 4:42:14 PM By: Sandre Kitty Signed: 08/15/2020 6:22:58 PM By: Rhae Hammock RN Entered By: Sandre Kitty on 08/14/2020 13:14:34 -------------------------------------------------------------------------------- Patient/Caregiver Education Details Patient Name: Date of Service: [name] 6/14/2022andnbsp1:15 PM Medical Record Number: [id number] Patient Account Number: [id number] Date of Birth/Gender: Treating RN: 04-19-1980 (40 y.o. [name] Primary Care Physician: [name] Other Clinician: Referring Physician: Treating Physician/Extender: Mare Ferrari in Treatment: 215 Education Assessment Education Provided To: Patient Education Topics Provided Pressure: Handouts: Pressure Ulcers: Care and Offloading, Pressure Ulcers: Care and Offloading 2, Preventing Pressure Ulcers Methods: Explain/Verbal Responses: Reinforcements needed Electronic  Signature(s) Signed: 08/14/2020 6:05:55 PM By:  Deon Pilling Entered By: Deon Pilling on 08/14/2020 13:35:45 -------------------------------------------------------------------------------- Wound Assessment Details Patient Name: Date of Service: [name], [name] 08/14/2020 1:15 PM Medical Record Number: [id number] Patient Account Number: [id number] Date of Birth/Sex: Treating RN: [birth year] (41 y.o. [name] Primary Care Catheryne Deford: [name] Other Clinician: Referring Donnesha Karg: Treating Lyan Holck/Extender: Mare Ferrari in Treatment: 215 Wound Status Wound Number: 1 Primary Etiology: Pressure Ulcer Wound Location: Medial Sacrum Wound Status: Open Wounding Event: Pressure Injury Comorbid History: Anemia, Osteomyelitis Date Acquired: 05/15/2016 Weeks Of Treatment: 215 Clustered Wound: No Photos Wound Measurements Length: (cm) 1.7 Width: (cm) 0.9 Depth: (cm) 2 Area: (cm) 1.202 Volume: (cm) 2.403 % Reduction in Area: 96.3% % Reduction in Volume: 98.3% Epithelialization: Medium (34-66%) Tunneling: No Undermining: No Wound Description Classification: Category/Stage IV Wound Margin: Well defined, not attached Exudate Amount: Medium Exudate Type: Serosanguineous Exudate Color: red, brown Foul Odor After Cleansing: No Slough/Fibrino No Wound Bed Granulation Amount: Large (67-100%) Exposed Structure Granulation Quality: Pink Fascia Exposed: No Necrotic Amount: None Present (0%) Fat Layer (Subcutaneous Tissue) Exposed: Yes Tendon Exposed: No Muscle Exposed: No Joint Exposed: No Bone Exposed: No Assessment Notes Slight maceration Treatment Notes Wound #1 (Sacrum) Wound Laterality: Medial Cleanser Wound Cleanser Discharge Instruction: Cleanse the wound with wound cleanser prior to applying a clean dressing using gauze sponges, not tissue or cotton balls. Soap and Water Discharge Instruction: May shower and wash wound with dial antibacterial soap and water prior to dressing  change. Peri-Wound Care Topical Primary Dressing KerraCel Ag Gelling Fiber Dressing, 4x5 in (silver alginate) Discharge Instruction: Pack silver alginate to wound bed lightly. Secondary Dressing Woven Gauze Sponge, Non-Sterile 4x4 in Discharge Instruction: Apply over primary dressing as directed. ABD Pad, 5x9 Discharge Instruction: Apply over primary dressing as directed. Secured With 11M [place] Surgical T ape, 2x2 (in/yd) Discharge Instruction: Secure dressing with tape as directed. Compression Wrap Compression Stockings Add-Ons Electronic Signature(s) Signed: 08/14/2020 4:42:14 PM By: Sandre Kitty Signed: 08/15/2020 2:58:24 PM By: Lorrin Jackson Previous Signature: 08/14/2020 1:59:21 PM Version By: Lorrin Jackson Entered By: Sandre Kitty on 08/14/2020 16:38:49 -------------------------------------------------------------------------------- [place] Details Patient Name: Date of Service: Jamie Boyden A. 08/14/2020 1:15 PM Medical Record Number: [id number] Patient Account Number: [id number] Date of Birth/Sex: Treating RN: 11-20-1980 (40 y.o. [name], [name] Primary Care Ernie Kasler: [name] Other Clinician: Referring Benino Korinek: Treating Izabela Ow/Extender: Mare Ferrari in Treatment: 215 Vital Signs Time Taken: 13:14 Temperature (F): 98.4 Height (in): 65 Pulse (bpm): 90 Weight (lbs): 201 Respiratory Rate (breaths/min): 18 Body Mass Index (BMI): 33.4 Blood Pressure (mmHg): 162/89 Reference Range: 80 - 120 mg / dl Electronic Signature(s) Signed: 08/14/2020 4:42:14 PM By: Sandre Kitty Entered By: Sandre Kitty on 08/14/2020 13:14:24

## 2020-08-17 ENCOUNTER — Other Ambulatory Visit: Payer: Self-pay | Admitting: *Deleted

## 2020-08-17 DIAGNOSIS — G894 Chronic pain syndrome: Secondary | ICD-10-CM

## 2020-08-17 MED ORDER — GABAPENTIN 600 MG PO TABS: 600.0000 mg | ORAL_TABLET | Freq: Four times a day (QID) | ORAL | 1 refills | Status: DC

## 2020-08-23 ENCOUNTER — Telehealth: Payer: Self-pay | Admitting: *Deleted

## 2020-08-23 NOTE — Telephone Encounter (Signed)
PA for oxycodone 10mg  APPROVED 08/23/2020 - 02/19/2021  Pharmacy aware

## 2020-08-23 NOTE — Telephone Encounter (Signed)
PA in process for oxycodone 10mg  through [facility] Confirmation #:[id number] W

## 2020-08-24 ENCOUNTER — Other Ambulatory Visit: Payer: Self-pay | Admitting: Family Medicine

## 2020-08-24 DIAGNOSIS — F411 Generalized anxiety disorder: Secondary | ICD-10-CM

## 2020-08-24 DIAGNOSIS — F339 Major depressive disorder, recurrent, unspecified: Secondary | ICD-10-CM

## 2020-08-27 ENCOUNTER — Telehealth: Payer: Self-pay | Admitting: Family Medicine

## 2020-08-27 NOTE — Telephone Encounter (Signed)
In addition to previous message, we can contact Dr [address]ario [address] office at [facility] [phone] if needed.

## 2020-08-27 NOTE — Telephone Encounter (Signed)
Spoke with patient and advised her we need to make an appointment for her to see Karsten Fells because of her elevated blood pressure.  Patient reports she does not have transportation and must give 5 days notice before she can get transportation to her doctor's visit.  Appointment was scheduled with [name] on 09/04/20 at 1:50 pm.  Patient advised if she has any headaches, visual changes, dizziness, etc she is to call 911 or go to nearest ER.

## 2020-08-27 NOTE — Telephone Encounter (Signed)
[name] called from [facility] to let us know that they just had a visit with patient to get her pump filled.  At 2:39 PM patients BP was 200/109 (LT arm) At 2:42 PM patients BP was 190/116 (RT arm) At 3:06 PM patients BP was 187/104 (LT arm, after pump was filled)  Pt was advised from [facility], Dr [address] office, to contact PCP office to be seen asap.  Pt declined. Said she wanted to wait to see PCP on 09/14/20, which is her current appt date.  Pt was then advised to seek ED if problems occurred.  I called pt to see if she could come in as early as tomorrow. Pt declined. Said she wanted to wait to be seen at her current appt date with [name] on 09/14/20.

## 2020-08-28 ENCOUNTER — Encounter (HOSPITAL_BASED_OUTPATIENT_CLINIC_OR_DEPARTMENT_OTHER): Payer: Medicaid Other | Admitting: Internal Medicine

## 2020-08-28 ENCOUNTER — Other Ambulatory Visit: Payer: Self-pay

## 2020-08-28 DIAGNOSIS — L89154 Pressure ulcer of sacral region, stage 4: Secondary | ICD-10-CM | POA: Diagnosis present

## 2020-08-28 DIAGNOSIS — G8222 Paraplegia, incomplete: Secondary | ICD-10-CM | POA: Diagnosis not present

## 2020-08-28 NOTE — Progress Notes (Addendum)
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 [name]. She already she also received IV antibiotics. We have been using silver alginate to the wound. Everything else is healed up except for the probing wound on the sacrum 11/16; patient is  here after an extended hiatus again. She has the small probing wound on her sacrum which we have been using silver alginate . Since she was last here she was apparently had placement of a baclofen pump. Apparently the surgical site at the lower left lumbar area became infected she ended up in hospital from 11/6 through 11/7 with wound dehiscence and probable infection. Her surgeon Dr. Christella Noa open the wound debrided it took cultures and placed the wound VAC. She is now receiving IV antibiotics at the direction of infectious disease which I think is ertapenem for 20 days. 03/09/2019 on evaluation today patient appears to be doing quite well with regard to her wound. Its been since 01/17/2019 when we last saw her. She subsequently ended up in the hospital due to a baclofen pump which became infected. She has been on antibiotics as prescribed by infectious disease for this and she was seeing Dr. Linus Salmons at [facility] for infectious disease. Subsequently she has been on doxycycline through November 29. The baclofen pump was placed on 12/22/2018 by Dr. Lovenia Shuck. Unfortunately the patient developed a wound infection and underwent  debridement by Dr. Christella Noa on 01/08/2019. The growth in the culture revealed ESBL E. coli and diphtheroids and the patient was initially placed on ertapenem him in doxycycline for 3 weeks. Subsequently she comes in today for reevaluation and it does appear that her wound is actually doing significantly better even compared to last time we saw her. This is in regard to the medial sacral region. 2/5; I have not seen this wound in almost 3 months. Certainly has gotten better in terms of surface area although there is significant direct depth. She has completed antibiotics for the underlying osteomyelitis. 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 She has been using silver alginate. Her mother is changing the dressing. She claims to be  offloading this area except for when she gets up to go to doctors appointments 3/12; this is a punched-out wound on the lower sacrum. Has a depth of about 1.8 cm. It does not appear to undermine. There is no palpable bone. We have been using silver alginate packing her mother is changing the dressing she claims to be offloading this. She had a baclofen pump placed to alleviate her spasticity 5/17; the patient has not been seen in over 2 months. 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 Apparently they started to notice an odor and drainage earlier this month. Patient was admitted to [facility] from 5/3 through 07/12/2019. We do not have any records here. Apparently she was found to have pneumonia, a UTI. She also went underwent a surgical debridement of her lower sacral wound. She comes in today with a really large postoperative wound. This does not have exposed bone but very close. This is right back to where this was a year or 2 ago. They have been using wet-to-dry. She is on an IV antibiotic but is not sure what drugs. We are not sure what home health company they are currently using. We are trying to get records from [facility]. 6/8; this the patient return to clinic 3 weeks ago. She had surgical debridement of the lower sacral wound. She apparently is completed her IV antibiotics although I am not exactly sure what IV antibiotics she had ordered. Her PICC line is come out. Her wound is large but generally clean there still is exposed bone. ooFortunately the area on the right heel is callused over I cannot prove there is an open area here per 6/22; they are using a wet to dry dressing when we left the clinic last time however they managed to find a box of silver alginate so they are using that with backing gauze. They are still changing this twice a day because of drainage issues. She has Medicaid and they will not be able to  replace the want dressing she will have to go back to a wet-to-dry. In spite of this the wound actually looks quite good some improvement in dimension 7/13; using silver alginate. Slight improvement in overall wound volume including depth although there is still undermining. She tells me she is offloading this is much as possible 8/10-Patient back at 1 month, continuing to use silver alginate although she has run out and therefore using saline wet-to-dry, undermining is minimal, continuing attempts at offloading according to patient 9/21; its been about 9 weeks since I have seen this patient although I note she was seen once in August. Her wounds on  the lower sacrum this looks a lot better than the last time I saw this. She is using silver alginate to the wound bed. They only have Medicaid therefore they have to need to pay out-of-pocket for the dressing supplies. This certainly limits options. 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 10/19; 1 month follow-up. Not much change in the wound in fact it is measuring slightly larger. Still using silver alginate which her mother is purchasing off [employer] I believe. Since she was here she had a suprapubic catheter placed because of a lacerated urethra. 11/30; patient has not been here in about 6 weeks. She apparently has had 3 admissions to the hospital for pneumonia in the last 7 months 2 in the last 2 months. She came out of hospital this time with 4 L of oxygen. Her mother is using the Anasept wet-to-dry to the sacral wound and surprisingly this looks somewhat better. The patient states she is pending 24/7 in bed except for going to doctor's appointments this may be helping. She has an appointment with pulmonology on 12/17. She was a heavy smoker for a period of time I wonder whether she has COPD 12/28; patient is doing well she is off her oxygen which may have something to do  with chronic Macrobid she was on [hypersensitivity pneumonitis]. She is also stop smoking. In any case she is doing well from a breathing point of view. She is using Anasept wet-to-dry. 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 She has been using Anasept gel wet-to-dry and really has done a nice job here. She does not have insurance for other wound care products but truthfully so far this is done a really good job. I note she is back on oxygen. 2/22; stage IV wound on her lower sacrum. They have been to using an Anasept gel wet-to-dry-based dressing. Ordinarily I would like to use a moistened collagen wet-to-dry but she is apparently not able to afford this. There was also some suggestion about using Dakin's but apparently her mother could not make 3/14; 3-week follow-up. She is going for bladder surgery at [facility] next week. Still using Anasept gel wet-to-dry. We will try to get Prisma wet to dry through what I think is [facility]. This probably will not work 4/19; 4-week follow-up. She is using collagen with wet-to-dry dressings every day. She reports improvement to the wound healing. Patient had bladder neck surgery with suprapubic catheter placement on 3/22. On 3/30 she was sent to the emergency department because of hypotension. She was found to be in septic shock in the setting of ESBL E. coli UTI. Currently she is doing well. 5/17; 4-week follow-up. She is using collagen with backing wet-to-dry. Really nice improvement in surface area of these wounds depth at 1.2 cm. She has had problems with urinary leakage. She does have a suprapubic catheter 6/14; 4-week follow-up. She was doing really well the last time we saw her in the wound had filled in quite a bit. However since that time her wound is gotten a lot deeper. Apparently her bladder surgery failed and she is incontinent a lot of the time. Furthermore her mother  suffered a stroke and is staying with her sister. She now has care attendance but I am not really certain about the amount of care she has. We have been using silver collagen  but apparently the family has to buy this privately. 6/28; the wound measures slightly larger I certainly do not see the difference. It has 1.5 cm of direct depth but not exposed to bone it does not appear to be infected. We had her using silver alginate although she does not seem to know what her mother's been dressing this with recently Objective Constitutional Patient is hypertensive.. Pulse regular and within target range for patient.[name] Respirations regular, non-labored and within target range.. Temperature is normal and within the target range for the patient.[name] Appears in no distress. Vitals Time Taken: 1:31 PM, Height: 65 in, Weight: 201 lbs, BMI: 33.4, Temperature: 98.0 F, Pulse: 86 bpm, Respiratory Rate: 16 breaths/min, Blood Pressure: 150/115 mmHg. General Notes: has not taken her BP meds for several days due to lost them - found them and took dose this morning General Notes: Wound exam; sacral ulcer. Depth of 1.5 cm. I do not think this is changed much. There is no evidence of surrounding erythema no palpable bone Integumentary (Hair, Skin) Wound #1 status is Open. Original cause of wound was Pressure Injury. The date acquired was: 05/15/2016. The wound has been in treatment 217 weeks. The wound is located on the Medial Sacrum. The wound measures 1.9cm length x 1.1cm width x 1.5cm depth; 1.641cm^2 area and 2.462cm^3 volume. There is Fat Layer (Subcutaneous Tissue) exposed. There is no tunneling or undermining noted. There is a medium amount of serosanguineous drainage noted. The wound margin is well defined and not attached to the wound base. There is large (67-100%) pink granulation within the wound bed. There is no necrotic tissue within the wound bed. Assessment Active Problems ICD-10 Pressure ulcer of sacral  region, stage 4 Paraplegia, incomplete Plan Follow-up Appointments: Return Appointment in 2 weeks. - Dr. Dellia Nims!!!! **Harrel Lemon** Bathing/ Shower/ Hygiene: May shower with protection but do not get wound dressing(s) wet. Off-Loading: Turn and reposition every 2 hours WOUND #1: - Sacrum Wound Laterality: Medial Cleanser: Soap and Water 2 x Per Day/30 Days Discharge Instructions: May shower and wash wound with dial antibacterial soap and water prior to dressing change. Cleanser: Wound Cleanser (Generic) 2 x Per Day/30 Days Discharge Instructions: Cleanse the wound with wound cleanser prior to applying a clean dressing using gauze sponges, not tissue or cotton balls. Prim Dressing: KerraCel Ag Gelling Fiber Dressing, 4x5 in (silver alginate) (Generic) 2 x Per Day/30 Days ary Discharge Instructions: Pack silver alginate to wound bed lightly. Secondary Dressing: Woven Gauze Sponge, Non-Sterile 4x4 in (Generic) 2 x Per Day/30 Days Discharge Instructions: Apply over primary dressing as directed. Secondary Dressing: ABD Pad, 5x9 (Generic) 2 x Per Day/30 Days Discharge Instructions: Apply over primary dressing as directed. Secured With: 48M Medipore H Soft Cloth Surgical T ape, 2x2 (in/yd) (Generic) 2 x Per Day/30 Days Discharge Instructions: Secure dressing with tape as directed. 1. After some difficulty we are able to determine that they were using Anasept gel wet to dry then cover this with silver alginate. We have asked him just to use the silver alginate. 2. Aggressiveness of dressing care is very limited by the fact that she is totally uninsured. Anything we order they have to order themselves. 3. No evidence of concurrent infection Electronic Signature(s) Signed: 08/28/2020 5:29:17 PM By: Linton Ham MD Entered By: Linton Ham on 08/28/2020 14:01:58 -------------------------------------------------------------------------------- SuperBill Details Patient Name: Date of  Service: Jamie Boyden A. 08/28/2020 Medical Record Number: [id number] Patient Account Number: [id number] Date of Birth/Sex: Treating RN: 06/10/1980 (40 y.o. F) Hollie Salk,  [name] Primary Care Provider: [name] Other Clinician: Referring Provider: Treating Provider/Extender: Mare Ferrari in Treatment: 217 Diagnosis Coding ICD-10 Codes Code Description L89.154 Pressure ulcer of sacral region, stage 4 G82.22 Paraplegia, incomplete Facility Procedures CPT4 Code: [id number] Description: 83382 - WOUND CARE VISIT-LEV 4 EST PT Modifier: Quantity: 1 Physician Procedures Electronic Signature(s) Signed: 08/28/2020 5:29:17 PM By: Linton Ham MD Entered By: Linton Ham on 08/28/2020 14:02:22

## 2020-09-04 ENCOUNTER — Ambulatory Visit: Payer: Medicaid Other | Admitting: Family Medicine

## 2020-09-11 ENCOUNTER — Encounter (HOSPITAL_BASED_OUTPATIENT_CLINIC_OR_DEPARTMENT_OTHER): Payer: Medicaid Other | Attending: Internal Medicine | Admitting: Internal Medicine

## 2020-09-11 ENCOUNTER — Other Ambulatory Visit: Payer: Self-pay

## 2020-09-11 DIAGNOSIS — L89154 Pressure ulcer of sacral region, stage 4: Secondary | ICD-10-CM | POA: Diagnosis present

## 2020-09-11 DIAGNOSIS — G8222 Paraplegia, incomplete: Secondary | ICD-10-CM | POA: Diagnosis not present

## 2020-09-12 ENCOUNTER — Ambulatory Visit (INDEPENDENT_AMBULATORY_CARE_PROVIDER_SITE_OTHER): Payer: Medicaid Other | Admitting: Family Medicine

## 2020-09-12 ENCOUNTER — Encounter: Payer: Self-pay | Admitting: Family Medicine

## 2020-09-12 DIAGNOSIS — B372 Candidiasis of skin and nail: Secondary | ICD-10-CM

## 2020-09-12 DIAGNOSIS — I10 Essential (primary) hypertension: Secondary | ICD-10-CM | POA: Diagnosis not present

## 2020-09-12 DIAGNOSIS — M24562 Contracture, left knee: Secondary | ICD-10-CM

## 2020-09-12 DIAGNOSIS — M24561 Contracture, right knee: Secondary | ICD-10-CM | POA: Diagnosis not present

## 2020-09-12 DIAGNOSIS — G8929 Other chronic pain: Secondary | ICD-10-CM

## 2020-09-12 DIAGNOSIS — Z79899 Other long term (current) drug therapy: Secondary | ICD-10-CM

## 2020-09-12 MED ORDER — NYSTATIN 100000 UNIT/GM EX POWD
1.0000 | Freq: Three times a day (TID) | CUTANEOUS | 2 refills | Status: DC
Start: 2020-09-12 — End: 2022-04-07

## 2020-09-12 MED ORDER — CYCLOBENZAPRINE HCL 5 MG PO TABS: 5.0000 mg | ORAL_TABLET | Freq: Three times a day (TID) | ORAL | 2 refills | Status: DC | PRN

## 2020-09-12 MED ORDER — OXYCODONE HCL 10 MG PO TABS: 10.0000 mg | ORAL_TABLET | Freq: Three times a day (TID) | ORAL | 0 refills | Status: DC

## 2020-09-12 MED ORDER — LOSARTAN POTASSIUM 50 MG PO TABS
50.0000 mg | ORAL_TABLET | Freq: Every day | ORAL | 2 refills | Status: DC
Start: 2020-09-12 — End: 2020-09-13

## 2020-09-12 NOTE — Progress Notes (Addendum)
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 think this was ultimately felt to be secondary to pyelonephritis. She did have a CT scan of the abdomen and pelvis. Noted there was bone destruction within the coccyx however this was present on a  prior study which was felt to be stable when compared with the study from April 2020. Likely related to chronic osteomyelitis previously treated. 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 She was treated with 6 weeks of oral doxycycline by [name]. She already she also received IV antibiotics. We have been using silver alginate to the wound. Everything else is healed up except for the probing wound on the sacrum 11/16; patient is here after an extended hiatus again. She has the small probing wound on her sacrum which we have been using silver alginate . Since she was last here she was apparently had placement of a baclofen pump. Apparently the surgical site at the lower left lumbar area became infected she ended up in hospital from 11/6 through 11/7 with wound dehiscence and probable infection. Her surgeon Dr. Christella Noa open the wound debrided it took cultures and placed the wound VAC. She is now receiving IV antibiotics at the direction of infectious disease which I think is ertapenem for 20 days. 03/09/2019 on evaluation today patient appears to be doing quite well with regard to her wound. Its  been since 01/17/2019 when we last saw her. She subsequently ended up in the hospital due to a baclofen pump which became infected. She has been on antibiotics as prescribed by infectious disease for this and she was seeing Dr. Linus Salmons at [facility] for infectious disease. Subsequently she has been on doxycycline through November 29. The baclofen pump was placed on 12/22/2018 by Dr. Lovenia Shuck. Unfortunately the patient developed a wound infection and underwent debridement by Dr. Christella Noa on 01/08/2019. The growth in the culture revealed ESBL E. coli and diphtheroids and the patient was initially placed on ertapenem him in doxycycline for 3 weeks. Subsequently she comes in today for reevaluation and it does appear that her wound is actually doing significantly better even compared to last time we saw  her. This is in regard to the medial sacral region. 2/5; I have not seen this wound in almost 3 months. Certainly has gotten better in terms of surface area although there is significant direct depth. She has completed antibiotics for the underlying osteomyelitis. 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 She has been using silver alginate. Her mother is changing the dressing. She claims to be offloading this area except for when she gets up to go to doctors appointments 3/12; this is a punched-out wound on the lower sacrum. Has a depth of about 1.8 cm. It does not appear to undermine. There is no palpable bone. We have been using silver alginate packing her mother is changing the dressing she claims to be offloading this. She had a baclofen pump placed to alleviate her spasticity 5/17; the patient has not been seen in over 2 months. 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 Apparently they started to notice an odor and drainage earlier this month. Patient was admitted to [facility] from 5/3 through 07/12/2019. We do not have any records here. Apparently she was found to have pneumonia, a UTI. She also went underwent a surgical debridement of her lower sacral wound. She comes in today with a really large postoperative wound. This does not have exposed bone but very close. This is right back to where this was a year or 2 ago. They have been using wet-to-dry. She is on an IV antibiotic but is not sure what drugs. We are not sure what home health company they are currently using. We are trying to get records from [facility]. 6/8; this the patient return to clinic 3 weeks ago. She had surgical debridement of the lower sacral wound. She apparently is completed her IV antibiotics although I am not exactly sure what IV antibiotics she had ordered. Her PICC line is come out. Her wound is large  but generally clean there still is exposed bone. ooFortunately the area on the right heel is callused over I cannot prove there is an open area here per 6/22; they are using a wet to dry dressing when we left the clinic last time however they managed to find a box of silver alginate so they are using that with backing gauze. They are still changing this twice a day because of drainage issues. She has Medicaid and they will not be able to replace the want dressing she will have to go back to a wet-to-dry. In spite of this the wound actually looks quite good some improvement in dimension 7/13; using silver alginate. Slight improvement in  overall wound volume including depth although there is still undermining. She tells me she is offloading this is much as possible 8/10-Patient back at 1 month, continuing to use silver alginate although she has run out and therefore using saline wet-to-dry, undermining is minimal, continuing attempts at offloading according to patient 9/21; its been about 9 weeks since I have seen this patient although I note she was seen once in August. Her wounds on the lower sacrum this looks a lot better than the last time I saw this. She is using silver alginate to the wound bed. They only have Medicaid therefore they have to need to pay out-of-pocket for the dressing supplies. This certainly limits options. 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 10/19; 1 month follow-up. Not much change in the wound in fact it is measuring slightly larger. Still using silver alginate which her mother is purchasing off [employer] I believe. Since she was here she had a suprapubic catheter placed because of a lacerated urethra. 11/30; patient has not been here in about 6 weeks. She apparently has had 3 admissions to the hospital for pneumonia in the last 7 months 2 in the last 2 months. She came out of hospital this time  with 4 L of oxygen. Her mother is using the Anasept wet-to-dry to the sacral wound and surprisingly this looks somewhat better. The patient states she is pending 24/7 in bed except for going to doctor's appointments this may be helping. She has an appointment with pulmonology on 12/17. She was a heavy smoker for a period of time I wonder whether she has COPD 12/28; patient is doing well she is off her oxygen which may have something to do with chronic Macrobid she was on [hypersensitivity pneumonitis]. She is also stop smoking. In any case she is doing well from a breathing point of view. She is using Anasept wet-to-dry. 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 She has been using Anasept gel wet-to-dry and really has done a nice job here. She does not have insurance for other wound care products but truthfully so far this is done a really good job. I note she is back on oxygen. 2/22; stage IV wound on her lower sacrum. They have been to using an Anasept gel wet-to-dry-based dressing. Ordinarily I would like to use a moistened collagen wet-to-dry but she is apparently not able to afford this. There was also some suggestion about using Dakin's but apparently her mother could not make 3/14; 3-week follow-up. She is going for bladder surgery at [facility] next week. Still using Anasept gel wet-to-dry. We will try to get Prisma wet to dry through what I think is [facility] This probably will not work 4/19; 4-week follow-up. She is using collagen with wet-to-dry dressings every day. She reports improvement to the wound healing. Patient had bladder neck surgery with suprapubic catheter placement on 3/22. On 3/30 she was sent to the emergency department because of hypotension. She was found to be in septic shock in the setting of ESBL E. coli UTI. Currently she is doing well. 5/17; 4-week follow-up. She is using collagen with backing  wet-to-dry. Really nice improvement in surface area of these wounds depth at 1.2 cm. She has had problems with urinary leakage. She does have a suprapubic catheter 6/14; 4-week follow-up. She was doing really well the  last time we saw her in the wound had filled in quite a bit. However since that time her wound is gotten a lot deeper. Apparently her bladder surgery failed and she is incontinent a lot of the time. Furthermore her mother suffered a stroke and is staying with her sister. She now has care attendance but I am not really certain about the amount of care she has. We have been using silver collagen but apparently the family has to buy this privately. 6/28; the wound measures slightly larger I certainly do not see the difference. It has 1.5 cm of direct depth but not exposed to bone it does not appear to be infected. We had her using silver alginate although she does not seem to know what her mother's been dressing this with recently 7/12; 2-week follow-up. 1.7 mm of direct depth this is fluctuated a fair amount but I do not see any consistent trend towards closure. The tissue looks healthy minimal undermining no palpable bone. No evidence of surrounding infection. We have been using silver alginate wet-to-dry although the patient wants to go back to Anasept gel wet to dry Objective Constitutional Sitting or standing Blood Pressure is within target range for patient.. Pulse regular and within target range for patient.[name] Respirations regular, non-labored and within target range.. Temperature is normal and within the target range for the patient.[name] Appears in no distress. Vitals Time Taken: 12:53 PM, Height: 65 in, Weight: 201 lbs, BMI: 33.4, Temperature: 98.2 F, Pulse: 102 bpm, Respiratory Rate: 18 breaths/min, Blood Pressure: 113/82 mmHg. General Notes: Wound exam; depth of 1.7. I do not see a trend towards closure here. There is no surrounding erythema no palpable bone no evidence  of infection. No soft tissue crepitus Integumentary (Hair, Skin) Wound #1 status is Open. Original cause of wound was Pressure Injury. The date acquired was: 05/15/2016. The wound has been in treatment 219 weeks. The wound is located on the Medial Sacrum. The wound measures 2cm length x 1cm width x 2cm depth; 1.571cm^2 area and 3.142cm^3 volume. There is Fat Layer (Subcutaneous Tissue) exposed. There is no tunneling noted, however, there is undermining starting at 11:00 and ending at 2:00 with a maximum distance of 1.9cm. There is a medium amount of serosanguineous drainage noted. The wound margin is well defined and not attached to the wound base. There is large (67- 100%) pink granulation within the wound bed. There is no necrotic tissue within the wound bed. Assessment Active Problems ICD-10 Pressure ulcer of sacral region, stage 4 Paraplegia, incomplete Plan Follow-up Appointments: Return appointment in 1 month. - Dr. Dellia Nims!!!! **Harrel Lemon** Bathing/ Shower/ Hygiene: May shower with protection but do not get wound dressing(s) wet. Off-Loading: Turn and reposition every 2 hours WOUND #1: - Sacrum Wound Laterality: Medial Cleanser: Soap and Water 2 x Per Day/30 Days Discharge Instructions: May shower and wash wound with dial antibacterial soap and water prior to dressing change. Cleanser: Wound Cleanser (Generic) 2 x Per Day/30 Days Discharge Instructions: Cleanse the wound with wound cleanser prior to applying a clean dressing using gauze sponges, not tissue or cotton balls. Prim Dressing: Anasept moistened gauze 2 x Per Day/30 Days ary Discharge Instructions: Moisten gauze with anasept and pack gently into wound Secondary Dressing: Woven Gauze Sponge, Non-Sterile 4x4 in (Generic) 2 x Per Day/30 Days Discharge Instructions: Apply over primary dressing as directed. Secondary Dressing: ABD Pad, 5x9 (Generic) 2 x Per Day/30 Days Discharge Instructions: Apply over primary dressing as  directed. Secured With: 54M Medipore H  Soft Cloth Surgical T ape, 2x2 (in/yd) (Generic) 2 x Per Day/30 Days Discharge Instructions: Secure dressing with tape as directed. 1. At the patient's request we went back to Anasept wet-to-dry 2. This will be changed daily although I am not exactly sure who changes it daily. Her mother is going for surgery. 3. Ideally I would like to use collagen or silver collagen wet to dry. This however would add additional expense. 4. Not a candidate for a wound VAC [no insurance] Electronic Signature(s) Signed: 09/11/2020 5:25:56 PM By: Linton Ham MD Entered By: Linton Ham on 09/11/2020 13:58:39 -------------------------------------------------------------------------------- SuperBill Details Patient Name: Date of Service: Jamie Boyden A. 09/11/2020 Medical Record Number: [id number] Patient Account Number: [id number] Date of Birth/Sex: Treating RN: Sep 26, 1980 (40 y.o. [name], [name] Primary Care Provider: [name] Other Clinician: Referring Provider: Treating Provider/Extender: Mare Ferrari in Treatment: 219 Diagnosis Coding ICD-10 Codes Code Description L89.154 Pressure ulcer of sacral region, stage 4 G82.22 Paraplegia, incomplete Facility Procedures CPT4 Code: 76195093 Description: 99213 - WOUND CARE VISIT-LEV 3 EST PT Modifier: Quantity: 1 Physician Procedures : CPT4 Code Description Modifier [id number] - WC PHYS LEVEL 3 - EST PT ICD-10 Diagnosis Description L89.154 Pressure ulcer of sacral region, stage 4 G82.22 Paraplegia, incomplete Quantity: 1 Electronic Signature(s) Signed: 09/11/2020 5:25:56 PM By: Linton Ham MD Entered By: Linton Ham on 09/11/2020 13:58:56
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
## 2020-09-13 ENCOUNTER — Telehealth: Payer: Self-pay | Admitting: Family Medicine

## 2020-09-13 DIAGNOSIS — I10 Essential (primary) hypertension: Secondary | ICD-10-CM

## 2020-09-13 DIAGNOSIS — M24562 Contracture, left knee: Secondary | ICD-10-CM | POA: Insufficient documentation

## 2020-09-13 DIAGNOSIS — M24561 Contracture, right knee: Secondary | ICD-10-CM | POA: Insufficient documentation

## 2020-09-13 MED ORDER — LOSARTAN POTASSIUM 100 MG PO TABS: 100.0000 mg | ORAL_TABLET | Freq: Every day | ORAL | 2 refills | Status: DC

## 2020-09-13 NOTE — Telephone Encounter (Signed)
Spoke with patient and verified losartan was called in. Patient states she thought there was going to be a second BP med called in please advise.

## 2020-09-13 NOTE — Telephone Encounter (Signed)
Patient aware and verbalizes understanding. 

## 2020-09-13 NOTE — Telephone Encounter (Signed)
She was not previously taking any BP medications per her list, so I started Losartan.

## 2020-09-13 NOTE — Telephone Encounter (Signed)
In that case, increase the dosage from 50 mg to 100 mg once daily. I sent in a new prescription. She can take 2 of her current tablets to use them up.

## 2020-09-13 NOTE — Telephone Encounter (Signed)
Spoke with patient and she said she was taken that already

## 2020-09-14 ENCOUNTER — Ambulatory Visit: Payer: Medicaid Other | Admitting: Family Medicine

## 2020-09-17 NOTE — Progress Notes (Addendum)
FREADA, TWERSKY ([id number]) Visit Report for 09/11/2020 Arrival Information Details Patient Name: Date of Service: [name], [name] 09/11/2020 1:15 PM Medical Record Number: [id number] Patient Account Number: [id number] Date of Birth/Sex: Treating RN: 1980-08-26 (40 y.o. [name], [name] Primary Care [name]: [name] Other Clinician: Referring [name]: Treating [name]/Extender: [name] in Treatment: 75 Visit Information History Since Last Visit Added or deleted any medications: No Patient Arrived: Wheel Chair Any new allergies or adverse reactions: No Arrival Time: 12:55 Had a fall or experienced change in No Accompanied By: caregiver activities of daily living that may affect Transfer Assistance: [name] Lift risk of falls: Patient Identification Verified: Yes Signs or symptoms of abuse/neglect since last visito No Secondary Verification Process Completed: Yes Hospitalized since last visit: No Patient Requires Transmission-Based Precautions: No Implantable device outside of the clinic excluding No Patient Has Alerts: No cellular tissue based products placed in the center since last visit: Has Dressing in Place as Prescribed: Yes Pain Present Now: No Notes bladder surgery next month 10/11/2020. Electronic Signature(s) Signed: 09/11/2020 6:16:29 PM By: [name] Entered By: [name] on 09/11/2020 13:06:44 -------------------------------------------------------------------------------- Clinic Level of Care Assessment Details Patient Name: Date of Service: [name], [name] 09/11/2020 1:15 PM Medical Record Number: [id number] Patient Account Number: [id number] Date of Birth/Sex: Treating RN: [birth year] (40 y.o. [name], [name] Primary Care Micheala Morissette: [name] Other Clinician: Referring [name]: Treating [name]/Extender: [name] in Treatment: 219 Clinic Level of Care Assessment  Items TOOL 4 Quantity Score X- 1 0 Use when only an EandM is performed on FOLLOW-UP visit ASSESSMENTS - Nursing Assessment / Reassessment X- 1 10 Reassessment of Co-morbidities (includes updates in patient status) X- 1 5 Reassessment of Adherence to Treatment Plan ASSESSMENTS - Wound and Skin A ssessment / Reassessment X - Simple Wound Assessment / Reassessment - one wound 1 5 '[]'  - 0 Complex Wound Assessment / Reassessment - multiple wounds '[]'  - 0 Dermatologic / Skin Assessment (not related to wound area) ASSESSMENTS - Focused Assessment '[]'  - 0 Circumferential Edema Measurements - multi extremities '[]'  - 0 Nutritional Assessment / Counseling / Intervention '[]'  - 0 Lower Extremity Assessment (monofilament, tuning fork, pulses) '[]'  - 0 Peripheral Arterial Disease Assessment (using hand held doppler) ASSESSMENTS - Ostomy and/or Continence Assessment and Care '[]'  - 0 Incontinence Assessment and Management '[]'  - 0 Ostomy Care Assessment and Management (repouching, etc.) PROCESS - Coordination of Care '[]'  - 0 Simple Patient / Family Education for ongoing care X- 1 20 Complex (extensive) Patient / Family Education for ongoing care X- 1 10 Staff obtains Programmer, systems, Records, T Results / Process Orders est '[]'  - 0 Staff telephones HHA, Nursing Homes / Clarify orders / etc '[]'  - 0 Routine Transfer to another Facility (non-emergent condition) '[]'  - 0 Routine Hospital Admission (non-emergent condition) '[]'  - 0 New Admissions / Biomedical engineer / Ordering NPWT Apligraf, etc. , '[]'  - 0 Emergency Hospital Admission (emergent condition) '[]'  - 0 Simple Discharge Coordination X- 1 15 Complex (extensive) Discharge Coordination PROCESS - Special Needs '[]'  - 0 Pediatric / Minor Patient Management '[]'  - 0 Isolation Patient Management '[]'  - 0 Hearing / Language / Visual special needs '[]'  - 0 Assessment of Community assistance (transportation, D/C planning, etc.) '[]'  - 0 Additional  assistance / Altered mentation '[]'  - 0 Support Surface(s) Assessment (bed, cushion, seat, etc.) INTERVENTIONS - Wound Cleansing / Measurement X - Simple Wound Cleansing - one wound 1 5 '[]'  - 0 Complex  Wound Cleansing - multiple wounds X- 1 5 Wound Imaging (photographs - any number of wounds) '[]'  - 0 Wound Tracing (instead of photographs) X- 1 5 Simple Wound Measurement - one wound '[]'  - 0 Complex Wound Measurement - multiple wounds INTERVENTIONS - Wound Dressings '[]'  - 0 Small Wound Dressing one or multiple wounds X- 1 15 Medium Wound Dressing one or multiple wounds '[]'  - 0 Large Wound Dressing one or multiple wounds '[]'  - 0 Application of Medications - topical '[]'  - 0 Application of Medications - injection INTERVENTIONS - Miscellaneous '[]'  - 0 External ear exam '[]'  - 0 Specimen Collection (cultures, biopsies, blood, body fluids, etc.) '[]'  - 0 Specimen(s) / Culture(s) sent or taken to Lab for analysis '[]'  - 0 Patient Transfer (multiple staff / Civil Service fast streamer / Similar devices) '[]'  - 0 Simple Staple / Suture removal (25 or less) '[]'  - 0 Complex Staple / Suture removal (26 or more) '[]'  - 0 Hypo / Hyperglycemic Management (close monitor of Blood Glucose) '[]'  - 0 Ankle / Brachial Index (ABI) - do not check if billed separately X- 1 5 Vital Signs Has the patient been seen at the hospital within the last three years: Yes Total Score: 100 Level Of Care: New/Established - Level 3 Electronic Signature(s) Signed: 09/12/2020 6:12:45 PM By: Rhae Hammock RN Entered By: Rhae Hammock on 09/11/2020 13:50:26 -------------------------------------------------------------------------------- Encounter Discharge Information Details Patient Name: Date of Service: Jamie Boyden A. 09/11/2020 1:15 PM Medical Record Number: [id number] Patient Account Number: [id number] Date of Birth/Sex: Treating RN: [birth year] (40 y.o. [name] Primary Care [name]: [name] Other  Clinician: Referring [name]: Treating [name]/Extender: [name] in Treatment: 219 Encounter Discharge Information Items Discharge Condition: Stable Ambulatory Status: Wheelchair Discharge Destination: Home Transportation: Other Accompanied By: Caregiver Schedule Follow-up Appointment: Yes Clinical Summary of Care: Provided on 09/11/2020 Form Type Recipient Paper Patient Patient Electronic Signature(s) Signed: 09/11/2020 2:13:28 PM By: Lorrin Jackson Entered By: Lorrin Jackson on 09/11/2020 14:13:28 -------------------------------------------------------------------------------- Lower Extremity Assessment Details Patient Name: Date of Service: [name], Jamie A. 09/11/2020 1:15 PM Medical Record Number: [id number] Patient Account Number: [id number] Date of Birth/Sex: Treating RN: 09-Feb-1981 (40 y.o. [name] Primary Care Derelle Cockrell: [name] Other Clinician: Referring Rylyn Zawistowski: Treating Destan Franchini/Extender: [name] in Treatment: 219 Electronic Signature(s) Signed: 09/11/2020 6:16:29 PM By: [name] Entered By: [name] on 09/11/2020 13:07:20 -------------------------------------------------------------------------------- Multi Wound Chart Details Patient Name: Date of Service: [name], Jamie A. 09/11/2020 1:15 PM Medical Record Number: [id number] Patient Account Number: [id number] Date of Birth/Sex: Treating RN: 13-Dec-1980 (40 y.o. [name], [name] Primary Care Mellisa Arshad: [name] Other Clinician: Referring Stephana Morell: Treating Lena Fieldhouse/Extender: [name] in Treatment: 219 Vital Signs Height(in): 65 Pulse(bpm): 102 Weight(lbs): 201 Blood Pressure(mmHg): 113/82 Body Mass Index(BMI): 33 Temperature(F): 98.2 Respiratory Rate(breaths/min): 18 Photos: [N/A:N/A] Medial Sacrum N/A N/A Wound Location: Pressure Injury N/A N/A Wounding Event: Pressure  Ulcer N/A N/A Primary Etiology: Anemia, Osteomyelitis N/A N/A Comorbid History: 05/15/2016 N/A N/A Date Acquired: 219 N/A N/A Weeks of Treatment: Open N/A N/A Wound Status: 2x1x2 N/A N/A Measurements L x W x D (cm) 1.571 N/A N/A A (cm) : rea 3.142 N/A N/A Volume (cm) : 95.20% N/A N/A % Reduction in A rea: 97.80% N/A N/A % Reduction in Volume: 11 Starting Position 1 (o'clock): 2 Ending Position 1 (o'clock): 1.9 Maximum Distance 1 (cm): Yes N/A N/A Undermining: Category/Stage IV N/A N/A Classification: Medium N/A N/A Exudate A mount: Serosanguineous N/A N/A  Exudate Type: red, brown N/A N/A Exudate Color: Well defined, not attached N/A N/A Wound Margin: Large (67-100%) N/A N/A Granulation A mount: Pink N/A N/A Granulation Quality: None Present (0%) N/A N/A Necrotic A mount: Fat Layer (Subcutaneous Tissue): Yes N/A N/A Exposed Structures: Fascia: No Tendon: No Muscle: No Joint: No Bone: No Small (1-33%) N/A N/A Epithelialization: Treatment Notes Wound #1 (Sacrum) Wound Laterality: Medial Cleanser Soap and Water Discharge Instruction: May shower and wash wound with dial antibacterial soap and water prior to dressing change. Wound Cleanser Discharge Instruction: Cleanse the wound with wound cleanser prior to applying a clean dressing using gauze sponges, not tissue or cotton balls. Peri-Wound Care Topical Primary Dressing Anasept moistened gauze Discharge Instruction: Moisten gauze with anasept and pack gently into wound Secondary Dressing Woven Gauze Sponge, Non-Sterile 4x4 in Discharge Instruction: Apply over primary dressing as directed. ABD Pad, 5x9 Discharge Instruction: Apply over primary dressing as directed. Secured With 66M [employer] Surgical T ape, 2x2 (in/yd) Discharge Instruction: Secure dressing with tape as directed. Compression Wrap Compression Stockings Add-Ons Electronic Signature(s) Signed: 10/31/2020 7:55:01 AM By:  Linton Ham MD Signed: 03/21/2021 12:40:52 PM By: Rhae Hammock RN Previous Signature: 09/11/2020 5:25:56 PM Version By: Linton Ham MD Previous Signature: 09/12/2020 6:12:45 PM Version By: Rhae Hammock RN Entered By: Linton Ham on 10/30/2020 13:43:44 -------------------------------------------------------------------------------- Multi-Disciplinary Care Plan Details Patient Name: Date of Service: [name], [name] 09/11/2020 1:15 PM Medical Record Number: [id number] Patient Account Number: [id number] Date of Birth/Sex: Treating RN: 1980-04-15 (40 y.o. [name], [name] Primary Care Varick Keys: [name] Other Clinician: Referring Allecia Bells: Treating Milfred Krammes/Extender: [name] in Treatment: [place] reviewed with physician Active Inactive Pressure Nursing Diagnoses: Knowledge deficit related to management of pressures ulcers Potential for impaired tissue integrity related to pressure, friction, moisture, and shear Goals: Patient will remain free from development of additional pressure ulcers Date Initiated: 06/27/2016 Date Inactivated: 01/17/2019 Target Resolution Date: 12/24/2018 Goal Status: Met Patient/caregiver will verbalize risk factors for pressure ulcer development Date Initiated: 06/27/2016 Date Inactivated: 07/08/2017 Target Resolution Date: 07/08/2017 Goal Status: Met Patient/caregiver will verbalize understanding of pressure ulcer management Date Initiated: 06/27/2016 Target Resolution Date: 09/29/2020 Goal Status: Active Interventions: Assess: immobility, friction, shearing, incontinence upon admission and as needed Assess offloading mechanisms upon admission and as needed Assess potential for pressure ulcer upon admission and as needed Provide education on pressure ulcers Treatment Activities: Patient referred for pressure reduction/relief devices : 06/27/2016 Pressure reduction/relief  device ordered : 06/27/2016 Notes: Wound/Skin Impairment Nursing Diagnoses: Impaired tissue integrity Knowledge deficit related to smoking impact on wound healing Knowledge deficit related to ulceration/compromised skin integrity Goals: Patient will demonstrate a reduced rate of smoking or cessation of smoking Date Initiated: 06/27/2016 Date Inactivated: 01/26/2017 Target Resolution Date: 01/08/2017 Goal Status: Unmet Unmet Reason: pt continues to smoke Patient/caregiver will verbalize understanding of skin care regimen Date Initiated: 06/27/2016 Target Resolution Date: 09/16/2020 Goal Status: Active Ulcer/skin breakdown will have a volume reduction of 50% by week 8 Date Initiated: 06/27/2016 Date Inactivated: 12/11/2016 Target Resolution Date: 07/25/2016 Goal Status: Met Interventions: Assess patient/caregiver ability to obtain necessary supplies Assess patient/caregiver ability to perform ulcer/skin care regimen upon admission and as needed Assess ulceration(s) every visit Provide education on smoking Provide education on ulcer and skin care Treatment Activities: Skin care regimen initiated : 06/27/2016 Topical wound management initiated : 06/27/2016 Notes: Electronic Signature(s) Signed: 09/12/2020 6:12:45 PM By: Rhae Hammock RN Entered By: Rhae Hammock on 09/11/2020 13:48:54 -------------------------------------------------------------------------------- Pain  Assessment Details Patient Name: Date of Service: [name], [name] 09/11/2020 1:15 PM Medical Record Number: [id number] Patient Account Number: [id number] Date of Birth/Sex: Treating RN: Jul 30, 1980 (40 y.o. [name] Primary Care Aralynn Brake: [name] Other Clinician: Referring Darnel Mchan: Treating Marylu Dudenhoeffer/Extender: [name] in Treatment: 219 Active Problems Location of Pain Severity and Description of Pain Patient Has Paino No Site Locations Rate the pain. Rate  the pain. Current Pain Level: 0 Pain Management and Medication Current Pain Management: Medication: No Cold Application: No Rest: No Massage: No Activity: No T.E.N.S.: No Heat Application: No Leg drop or elevation: No Is the Current Pain Management Adequate: Adequate How does your wound impact your activities of daily livingo Sleep: No Bathing: No Appetite: No Relationship With Others: No Bladder Continence: No Emotions: No Bowel Continence: No Work: No Toileting: No Drive: No Dressing: No Hobbies: No Electronic Signature(s) Signed: 09/11/2020 6:16:29 PM By: [name] Entered By: [name] on 09/11/2020 13:07:15 -------------------------------------------------------------------------------- Patient/Caregiver Education Details Patient Name: Date of Service: [name] 7/12/2022andnbsp1:15 PM Medical Record Number: [id number] Patient Account Number: [id number] Date of Birth/Gender: Treating RN: 1980-03-25 (40 y.o. [name], [name] Primary Care Physician: [name] Other Clinician: Referring Physician: Treating Physician/Extender: [name] in Treatment: 219 Education Assessment Education Provided To: Patient Education Topics Provided Pressure: Methods: Explain/Verbal Responses: State content correctly Electronic Signature(s) Signed: 09/12/2020 6:12:45 PM By: Rhae Hammock RN Entered By: Rhae Hammock on 09/11/2020 13:49:13 -------------------------------------------------------------------------------- Wound Assessment Details Patient Name: Date of Service: [name], Jamie A. 09/11/2020 1:15 PM Medical Record Number: [id number] Patient Account Number: [id number] Date of Birth/Sex: Treating RN: September 04, 1980 (40 y.o. [name], Tammi Klippel Primary Care Sena Clouatre: [name] Other Clinician: Referring Nayah Lukens: Treating Ziyana Morikawa/Extender: [name] in Treatment: 219 Wound  Status Wound Number: 1 Primary Etiology: Pressure Ulcer Wound Location: Medial Sacrum Wound Status: Open Wounding Event: Pressure Injury Comorbid History: Anemia, Osteomyelitis Date Acquired: 05/15/2016 Weeks Of Treatment: 219 Clustered Wound: No Photos Wound Measurements Length: (cm) 2 Width: (cm) 1 Depth: (cm) 2 Area: (cm) 1.571 Volume: (cm) 3.142 % Reduction in Area: 95.2% % Reduction in Volume: 97.8% Epithelialization: Small (1-33%) Tunneling: No Undermining: Yes Starting Position (o'clock): 11 Ending Position (o'clock): 2 Maximum Distance: (cm) 1.9 Wound Description Classification: Category/Stage IV Wound Margin: Well defined, not attached Exudate Amount: Medium Exudate Type: Serosanguineous Exudate Color: red, brown Foul Odor After Cleansing: No Slough/Fibrino No Wound Bed Granulation Amount: Large (67-100%) Exposed Structure Granulation Quality: Pink Fascia Exposed: No Necrotic Amount: None Present (0%) Fat Layer (Subcutaneous Tissue) Exposed: Yes Tendon Exposed: No Muscle Exposed: No Joint Exposed: No Bone Exposed: No Electronic Signature(s) Signed: 09/11/2020 6:16:29 PM By: [name] Signed: 09/17/2020 3:49:49 PM By: Sandre Kitty Entered By: Sandre Kitty on 09/11/2020 17:01:40 -------------------------------------------------------------------------------- [place] Details Patient Name: Date of Service: Jamie Boyden A. 09/11/2020 1:15 PM Medical Record Number: [id number] Patient Account Number: [id number] Date of Birth/Sex: Treating RN: 11/07/1980 (40 y.o. [name], Tammi Klippel Primary Care Orrie Schubert: [name] Other Clinician: Referring Nikia Levels: Treating Audriana Aldama/Extender: [name] in Treatment: 219 Vital Signs Time Taken: 12:53 Temperature (F): 98.2 Height (in): 65 Pulse (bpm): 102 Weight (lbs): 201 Respiratory Rate (breaths/min): 18 Body Mass Index (BMI): 33.4 Blood Pressure (mmHg):  113/82 Reference Range: 80 - 120 mg / dl Electronic Signature(s) Signed: 09/11/2020 6:16:29 PM By: [name] Entered By: [name] on 09/11/2020 13:07:07

## 2020-09-20 NOTE — Progress Notes (Signed)
[name], [name] ([id number]) Visit Report for 08/28/2020 Arrival Information Details Patient Name: Date of Service: [name], [name] 08/28/2020 1:15 PM Medical Record Number: [id number] Patient Account Number: [id number] Date of Birth/Sex: Treating RN: 08-16-1980 (40 y.o. [name], [name] Primary Care [name]: [name] Other Clinician: Referring [name]: Treating Magalie Almon/Extender: Mare Ferrari in Treatment: 217 Visit Information History Since Last Visit All ordered tests and consults were completed: No Patient Arrived: Wheel Chair Added or deleted any medications: No Arrival Time: 13:30 Any new allergies or adverse reactions: No Accompanied By: alone Had a fall or experienced change in No Transfer Assistance: Civil [employer] fast streamer activities of daily living that may affect Patient Identification Verified: Yes risk of falls: Secondary Verification Process Completed: Yes Signs or symptoms of abuse/neglect since last visito No Patient Requires Transmission-Based Precautions: No Hospitalized since last visit: No Patient Has Alerts: No Implantable device outside of the clinic excluding No cellular tissue based products placed in the center since last visit: Has Dressing in Place as Prescribed: Yes Pain Present Now: No Electronic Signature(s) Signed: 09/20/2020 9:34:59 PM By: Deon Pilling Previous Signature: 09/13/2020 9:32:40 AM Version By: Deon Pilling Entered By: Deon Pilling on 09/20/2020 21:33:54 -------------------------------------------------------------------------------- Clinic Level of Care Assessment Details Patient Name: Date of Service: [name] 08/28/2020 1:15 PM Medical Record Number: [id number] Patient Account Number: [id number] Date of Birth/Sex: Treating RN: [birth year] (40 y.o. [name], [name] Primary Care [name]: [name] Other Clinician: Referring Aleane Wesenberg: Treating Erikson Danzy/Extender: Mare Ferrari in Treatment: 217 Clinic Level of Care Assessment Items TOOL 4 Quantity Score X- 1 0 Use when only an EandM is performed on FOLLOW-UP visit ASSESSMENTS - Nursing Assessment / Reassessment X- 1 10 Reassessment of Co-morbidities (includes updates in patient status) X- 1 5 Reassessment of Adherence to Treatment Plan ASSESSMENTS - Wound and Skin A ssessment / Reassessment '[]'  - 0 Simple Wound Assessment / Reassessment - one wound X- 1 5 Complex Wound Assessment / Reassessment - multiple wounds X- 1 10 Dermatologic / Skin Assessment (not related to wound area) ASSESSMENTS - Focused Assessment '[]'  - 0 Circumferential Edema Measurements - multi extremities '[]'  - 0 Nutritional Assessment / Counseling / Intervention '[]'  - 0 Lower Extremity Assessment (monofilament, tuning fork, pulses) '[]'  - 0 Peripheral Arterial Disease Assessment (using hand held doppler) ASSESSMENTS - Ostomy and/or Continence Assessment and Care '[]'  - 0 Incontinence Assessment and Management '[]'  - 0 Ostomy Care Assessment and Management (repouching, etc.) PROCESS - Coordination of Care '[]'  - 0 Simple Patient / Family Education for ongoing care X- 1 20 Complex (extensive) Patient / Family Education for ongoing care X- 1 10 Staff obtains Programmer, systems, Records, T Results / Process Orders est '[]'  - 0 Staff telephones HHA, Nursing Homes / Clarify orders / etc '[]'  - 0 Routine Transfer to another Facility (non-emergent condition) '[]'  - 0 Routine Hospital Admission (non-emergent condition) '[]'  - 0 New Admissions / Biomedical engineer / Ordering NPWT Apligraf, etc. , '[]'  - 0 Emergency Hospital Admission (emergent condition) '[]'  - 0 Simple Discharge Coordination X- 1 15 Complex (extensive) Discharge Coordination PROCESS - Special Needs '[]'  - 0 Pediatric / Minor Patient Management '[]'  - 0 Isolation Patient Management '[]'  - 0 Hearing / Language / Visual special needs '[]'  - 0 Assessment of  Community assistance (transportation, D/C planning, etc.) '[]'  - 0 Additional assistance / Altered mentation '[]'  - 0 Support Surface(s) Assessment (bed, cushion, seat, etc.) INTERVENTIONS - Wound Cleansing / Measurement X - Simple Wound  Cleansing - one wound 1 5 '[]'  - 0 Complex Wound Cleansing - multiple wounds X- 1 5 Wound Imaging (photographs - any number of wounds) '[]'  - 0 Wound Tracing (instead of photographs) X- 1 5 Simple Wound Measurement - one wound '[]'  - 0 Complex Wound Measurement - multiple wounds INTERVENTIONS - Wound Dressings X - Small Wound Dressing one or multiple wounds 1 10 '[]'  - 0 Medium Wound Dressing one or multiple wounds '[]'  - 0 Large Wound Dressing one or multiple wounds X- 1 5 Application of Medications - topical '[]'  - 0 Application of Medications - injection INTERVENTIONS - Miscellaneous '[]'  - 0 External ear exam '[]'  - 0 Specimen Collection (cultures, biopsies, blood, body fluids, etc.) '[]'  - 0 Specimen(s) / Culture(s) sent or taken to Lab for analysis X- 1 10 Patient Transfer (multiple staff / Civil Service fast streamer / Similar devices) '[]'  - 0 Simple Staple / Suture removal (25 or less) '[]'  - 0 Complex Staple / Suture removal (26 or more) '[]'  - 0 Hypo / Hyperglycemic Management (close monitor of Blood Glucose) '[]'  - 0 Ankle / Brachial Index (ABI) - do not check if billed separately X- 1 5 Vital Signs Has the patient been seen at the hospital within the last three years: Yes Total Score: 120 Level Of Care: New/Established - Level 4 Electronic Signature(s) Signed: 08/28/2020 5:48:53 PM By: Rhae Hammock RN Entered By: Rhae Hammock on 08/28/2020 13:55:07 -------------------------------------------------------------------------------- Encounter Discharge Information Details Patient Name: Date of Service: Jamie Boyden A. 08/28/2020 1:15 PM Medical Record Number: [id number] Patient Account Number: [id number] Date of Birth/Sex: Treating RN: [birth year] (40  y.o. [name] Primary Care Pranit Owensby: [name] Other Clinician: Referring Lynee Rosenbach: Treating Cariah Salatino/Extender: Mare Ferrari in Treatment: 217 Encounter Discharge Information Items Discharge Condition: Stable Ambulatory Status: Wheelchair Discharge Destination: Home Transportation: Other Schedule Follow-up Appointment: Yes Clinical Summary of Care: Provided on 08/28/2020 Form Type Recipient Paper Patient Patient Electronic Signature(s) Signed: 08/28/2020 2:51:47 PM By: Lorrin Jackson Entered By: Lorrin Jackson on 08/28/2020 14:51:47 -------------------------------------------------------------------------------- Lower Extremity Assessment Details Patient Name: Date of Service: [name], [name] 08/28/2020 1:15 PM Medical Record Number: [id number] Patient Account Number: [id number] Date of Birth/Sex: Treating RN: December 10, 1980 (40 y.o. [name] Primary Care Hendrixx Severin: [name] Other Clinician: Referring Jackalynn Art: Treating Adriauna Campton/Extender: Mare Ferrari in Treatment: 217 Electronic Signature(s) Signed: 09/20/2020 9:34:59 PM By: Deon Pilling Previous Signature: 09/13/2020 9:32:40 AM Version By: Deon Pilling Entered By: Deon Pilling on 09/20/2020 21:34:11 -------------------------------------------------------------------------------- Multi Wound Chart Details Patient Name: Date of Service: [name], [name] 08/28/2020 1:15 PM Medical Record Number: [id number] Patient Account Number: [id number] Date of Birth/Sex: Treating RN: 27-Aug-1980 (40 y.o. [name], [name] Primary Care Raguel Kosloski: [name] Other Clinician: Referring Emi Lymon: Treating Azaryah Heathcock/Extender: Mare Ferrari in Treatment: 217 Vital Signs Height(in): 65 Pulse(bpm): 86 Weight(lbs): 201 Blood Pressure(mmHg): 150/115 Body Mass Index(BMI): 33 Temperature(F): 98.0 Respiratory Rate(breaths/min):  16 Photos: [1:No Photos Medial Sacrum] [N/A:N/A N/A] Wound Location: [1:Pressure Injury] [N/A:N/A] Wounding Event: [1:Pressure Ulcer] [N/A:N/A] Primary Etiology: [1:Anemia, Osteomyelitis] [N/A:N/A] Comorbid History: [1:05/15/2016] [N/A:N/A] Date Acquired: [1:217] [N/A:N/A] Weeks of Treatment: [1:Open] [N/A:N/A] Wound Status: [1:1.9x1.1x1.5] [N/A:N/A] Measurements L x W x D (cm) [1:1.641] [N/A:N/A] A (cm) : rea [1:2.462] [N/A:N/A] Volume (cm) : [1:95.00%] [N/A:N/A] % Reduction in A rea: [1:98.30%] [N/A:N/A] % Reduction in Volume: [1:Category/Stage IV] [N/A:N/A] Classification: [1:Medium] [N/A:N/A] Exudate A mount: [1:Serosanguineous] [N/A:N/A] Exudate Type: [1:red, brown] [N/A:N/A] Exudate Color: [1:Well defined, not attached] [N/A:N/A] Wound Margin: [1:Large (  67-100%)] [N/A:N/A] Granulation A mount: [1:Pink] [N/A:N/A] Granulation Quality: [1:None Present (0%)] [N/A:N/A] Necrotic A mount: [1:Fat Layer (Subcutaneous Tissue): Yes N/A] Exposed Structures: [1:Fascia: No Tendon: No Muscle: No Joint: No Bone: No Small (1-33%)] [N/A:N/A] Treatment Notes Electronic Signature(s) Signed: 08/28/2020 5:29:17 PM By: Linton Ham MD Signed: 08/28/2020 5:48:53 PM By: Rhae Hammock RN Entered By: Linton Ham on 08/28/2020 13:56:28 -------------------------------------------------------------------------------- Multi-Disciplinary Care Plan Details Patient Name: Date of Service: [name], Jamie A. 08/28/2020 1:15 PM Medical Record Number: [id number] Patient Account Number: [id number] Date of Birth/Sex: Treating RN: 07-12-1980 (40 y.o. [name], [name] Primary Care Caycee Wanat: [name] Other Clinician: Referring Kavonte Bearse: Treating Vaness Jelinski/Extender: Mare Ferrari in Treatment: [place] reviewed with physician Active Inactive Pressure Nursing Diagnoses: Knowledge deficit related to management of pressures ulcers Potential  for impaired tissue integrity related to pressure, friction, moisture, and shear Goals: Patient will remain free from development of additional pressure ulcers Date Initiated: 06/27/2016 Date Inactivated: 01/17/2019 Target Resolution Date: 12/24/2018 Goal Status: Met Patient/caregiver will verbalize risk factors for pressure ulcer development Date Initiated: 06/27/2016 Date Inactivated: 07/08/2017 Target Resolution Date: 07/08/2017 Goal Status: Met Patient/caregiver will verbalize understanding of pressure ulcer management Date Initiated: 06/27/2016 Target Resolution Date: 08/31/2020 Goal Status: Active Interventions: Assess: immobility, friction, shearing, incontinence upon admission and as needed Assess offloading mechanisms upon admission and as needed Assess potential for pressure ulcer upon admission and as needed Provide education on pressure ulcers Treatment Activities: Patient referred for pressure reduction/relief devices : 06/27/2016 Pressure reduction/relief device ordered : 06/27/2016 Notes: Wound/Skin Impairment Nursing Diagnoses: Impaired tissue integrity Knowledge deficit related to smoking impact on wound healing Knowledge deficit related to ulceration/compromised skin integrity Goals: Patient will demonstrate a reduced rate of smoking or cessation of smoking Date Initiated: 06/27/2016 Date Inactivated: 01/26/2017 Target Resolution Date: 01/08/2017 Goal Status: Unmet Unmet Reason: pt continues to smoke Patient/caregiver will verbalize understanding of skin care regimen Date Initiated: 06/27/2016 Target Resolution Date: 08/31/2020 Goal Status: Active Ulcer/skin breakdown will have a volume reduction of 50% by week 8 Date Initiated: 06/27/2016 Date Inactivated: 12/11/2016 Target Resolution Date: 07/25/2016 Goal Status: Met Interventions: Assess patient/caregiver ability to obtain necessary supplies Assess patient/caregiver ability to perform ulcer/skin care regimen upon  admission and as needed Assess ulceration(s) every visit Provide education on smoking Provide education on ulcer and skin care Treatment Activities: Skin care regimen initiated : 06/27/2016 Topical wound management initiated : 06/27/2016 Notes: Electronic Signature(s) Signed: 08/28/2020 5:48:53 PM By: Rhae Hammock RN Entered By: Rhae Hammock on 08/28/2020 13:50:12 -------------------------------------------------------------------------------- Pain Assessment Details Patient Name: Date of Service: [name], Jamie A. 08/28/2020 1:15 PM Medical Record Number: [id number] Patient Account Number: [id number] Date of Birth/Sex: Treating RN: 12/24/1980 (40 y.o. [name] Primary Care Nyasha Rahilly: [name] Other Clinician: Referring Markeise Mathews: Treating Tani Virgo/Extender: Mare Ferrari in Treatment: 217 Active Problems Location of Pain Severity and Description of Pain Patient Has Paino No Site Locations Rate the pain. Current Pain Level: 0 Pain Management and Medication Current Pain Management: Electronic Signature(s) Signed: 09/20/2020 9:34:59 PM By: Deon Pilling Previous Signature: 09/13/2020 9:32:40 AM Version By: Deon Pilling Entered By: Deon Pilling on 09/20/2020 21:34:06 -------------------------------------------------------------------------------- Patient/Caregiver Education Details Patient Name: Date of Service: [name] 6/28/2022andnbsp1:15 PM Medical Record Number: [id number] Patient Account Number: [id number] Date of Birth/Gender: Treating RN: 23-Oct-1980 (40 y.o. [name], [name] Primary Care Physician: [name] Other Clinician: Referring Physician: Treating Physician/Extender: Mare Ferrari in Treatment: 217 Education Assessment Education Provided To:  Patient Education Topics Provided Pressure: Methods: Explain/Verbal Responses: State content correctly Smoking and Wound  Healing: Methods: Explain/Verbal Responses: State content correctly Wound/Skin Impairment: Methods: Explain/Verbal Responses: State content correctly Electronic Signature(s) Signed: 08/28/2020 5:48:53 PM By: Rhae Hammock RN Entered By: Rhae Hammock on 08/28/2020 13:50:36 -------------------------------------------------------------------------------- Wound Assessment Details Patient Name: Date of Service: [name], Jamie A. 08/28/2020 1:15 PM Medical Record Number: [id number] Patient Account Number: [id number] Date of Birth/Sex: Treating RN: 08-Aug-1980 (40 y.o. [name], Tammi Klippel Primary Care Keola Heninger: [name] Other Clinician: Referring Vishnu Moeller: Treating Summerlynn Glauser/Extender: Mare Ferrari in Treatment: 217 Wound Status Wound Number: 1 Primary Etiology: Pressure Ulcer Wound Location: Medial Sacrum Wound Status: Open Wounding Event: Pressure Injury Comorbid History: Anemia, Osteomyelitis Date Acquired: 05/15/2016 Weeks Of Treatment: 217 Clustered Wound: No Photos Wound Measurements Length: (cm) 1.9 Width: (cm) 1.1 Depth: (cm) 1.5 Area: (cm) 1.641 Volume: (cm) 2.462 % Reduction in Area: 95% % Reduction in Volume: 98.3% Epithelialization: Small (1-33%) Tunneling: No Undermining: No Wound Description Classification: Category/Stage IV Wound Margin: Well defined, not attached Exudate Amount: Medium Exudate Type: Serosanguineous Exudate Color: red, brown Foul Odor After Cleansing: No Slough/Fibrino No Wound Bed Granulation Amount: Large (67-100%) Exposed Structure Granulation Quality: Pink Fascia Exposed: No Necrotic Amount: None Present (0%) Fat Layer (Subcutaneous Tissue) Exposed: Yes Tendon Exposed: No Muscle Exposed: No Joint Exposed: No Bone Exposed: No Treatment Notes Wound #1 (Sacrum) Wound Laterality: Medial Cleanser Soap and Water Discharge Instruction: May shower and wash wound with dial antibacterial soap and  water prior to dressing change. Wound Cleanser Discharge Instruction: Cleanse the wound with wound cleanser prior to applying a clean dressing using gauze sponges, not tissue or cotton balls. Peri-Wound Care Topical Primary Dressing KerraCel Ag Gelling Fiber Dressing, 4x5 in (silver alginate) Discharge Instruction: Pack silver alginate to wound bed lightly. Secondary Dressing Woven Gauze Sponge, Non-Sterile 4x4 in Discharge Instruction: Apply over primary dressing as directed. ABD Pad, 5x9 Discharge Instruction: Apply over primary dressing as directed. Secured With 16M [place] Surgical T ape, 2x2 (in/yd) Discharge Instruction: Secure dressing with tape as directed. Compression Wrap Compression Stockings Add-Ons Electronic Signature(s) Signed: 09/20/2020 9:34:59 PM By: Deon Pilling Previous Signature: 08/29/2020 10:30:26 AM Version By: Sandre Kitty Previous Signature: 09/13/2020 9:32:40 AM Version By: Deon Pilling Entered By: Deon Pilling on 09/20/2020 21:34:24 -------------------------------------------------------------------------------- Vitals Details Patient Name: Date of Service: Jamie Boyden A. 08/28/2020 1:15 PM Medical Record Number: [id number] Patient Account Number: [id number] Date of Birth/Sex: Treating RN: 11/20/1980 (40 y.o. [name], Tammi Klippel Primary Care Tamarcus Condie: [name] Other Clinician: Referring Eyob Godlewski: Treating Quinlynn Cuthbert/Extender: Mare Ferrari in Treatment: 217 Vital Signs Time Taken: 13:31 Temperature (F): 98.0 Height (in): 65 Pulse (bpm): 86 Weight (lbs): 201 Respiratory Rate (breaths/min): 16 Body Mass Index (BMI): 33.4 Blood Pressure (mmHg): 150/115 Reference Range: 80 - 120 mg / dl Notes has not taken her BP meds for several days due to lost them - found them and took dose this morning Electronic Signature(s) Signed: 09/20/2020 9:34:59 PM By: Deon Pilling Previous Signature: 09/13/2020  9:32:40 AM Version By: Deon Pilling Entered By: Deon Pilling on 09/20/2020 21:34:00

## 2020-09-21 ENCOUNTER — Other Ambulatory Visit: Payer: Self-pay | Admitting: Family Medicine

## 2020-09-21 DIAGNOSIS — F411 Generalized anxiety disorder: Secondary | ICD-10-CM

## 2020-09-21 DIAGNOSIS — F410 Panic disorder [episodic paroxysmal anxiety] without agoraphobia: Secondary | ICD-10-CM

## 2020-09-21 DIAGNOSIS — I10 Essential (primary) hypertension: Secondary | ICD-10-CM

## 2020-09-25 ENCOUNTER — Ambulatory Visit: Payer: Medicaid Other | Admitting: Family Medicine

## 2020-09-26 ENCOUNTER — Ambulatory Visit: Payer: Self-pay

## 2020-09-26 ENCOUNTER — Ambulatory Visit (INDEPENDENT_AMBULATORY_CARE_PROVIDER_SITE_OTHER): Payer: Medicaid Other

## 2020-09-26 ENCOUNTER — Ambulatory Visit: Payer: Medicaid Other | Admitting: Orthopedic Surgery

## 2020-09-26 ENCOUNTER — Encounter: Payer: Self-pay | Admitting: Orthopedic Surgery

## 2020-09-26 DIAGNOSIS — M24561 Contracture, right knee: Secondary | ICD-10-CM

## 2020-09-26 DIAGNOSIS — M24562 Contracture, left knee: Secondary | ICD-10-CM
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
## 2020-09-28 ENCOUNTER — Telehealth: Payer: Self-pay | Admitting: Family Medicine

## 2020-09-28 DIAGNOSIS — B372 Candidiasis of skin and nail: Secondary | ICD-10-CM

## 2020-09-28 MED ORDER — FLUCONAZOLE 150 MG PO TABS: 150.0000 mg | ORAL_TABLET | ORAL | 0 refills | Status: DC

## 2020-09-28 NOTE — Telephone Encounter (Signed)
Pt aware.

## 2020-09-28 NOTE — Telephone Encounter (Signed)
Patient is calling in to see if Karsten Fells will call in something for her yeast.  Was discussed at her visit with [facility] and she was using powder for it, but it is not working.  Can the pill form be called in? [facility]

## 2020-09-28 NOTE — Telephone Encounter (Signed)
I sent diflucan once weekly x4 weeks.

## 2020-10-01 ENCOUNTER — Telehealth: Payer: Self-pay | Admitting: Family Medicine

## 2020-10-01 NOTE — Telephone Encounter (Signed)
Patient advised that the medication is as needed and she doesn't have to take TID unless she needs it. Patient verbalized understanding.

## 2020-10-12 ENCOUNTER — Telehealth: Payer: Self-pay | Admitting: *Deleted

## 2020-10-12 NOTE — Telephone Encounter (Signed)
TC from [place] w/ [facility] Pt had minor surgery today at [facility] they gave her an Rx for Norco for break through pain Pt is on a pain management contract with [name] for her Oxy Rx Discussed with Dr. Lajuana Ripple since Karsten Fells was off today, since this is for break through pain d/t a surgery and is for a limit number, pt is to use sparingly and will not break her contract.

## 2020-10-17 ENCOUNTER — Encounter (HOSPITAL_BASED_OUTPATIENT_CLINIC_OR_DEPARTMENT_OTHER): Payer: Medicaid Other | Admitting: Internal Medicine

## 2020-10-19 ENCOUNTER — Other Ambulatory Visit: Payer: Self-pay | Admitting: Family Medicine

## 2020-10-19 DIAGNOSIS — K219 Gastro-esophageal reflux disease without esophagitis: Secondary | ICD-10-CM

## 2020-10-19 DIAGNOSIS — I10 Essential (primary) hypertension: Secondary | ICD-10-CM

## 2020-10-19 DIAGNOSIS — B372 Candidiasis of skin and nail: Secondary | ICD-10-CM

## 2020-10-26 ENCOUNTER — Other Ambulatory Visit: Payer: Self-pay | Admitting: Family Medicine

## 2020-10-26 DIAGNOSIS — K219 Gastro-esophageal reflux disease without esophagitis: Secondary | ICD-10-CM

## 2020-10-30 ENCOUNTER — Other Ambulatory Visit: Payer: Self-pay

## 2020-10-30 ENCOUNTER — Encounter (HOSPITAL_BASED_OUTPATIENT_CLINIC_OR_DEPARTMENT_OTHER): Payer: Medicaid Other | Attending: Internal Medicine | Admitting: Internal Medicine

## 2020-10-30 DIAGNOSIS — F17200 Nicotine dependence, unspecified, uncomplicated: Secondary | ICD-10-CM | POA: Diagnosis not present

## 2020-10-30 DIAGNOSIS — L89154 Pressure ulcer of sacral region, stage 4: Secondary | ICD-10-CM | POA: Insufficient documentation

## 2020-10-30 DIAGNOSIS — G8222 Paraplegia, incomplete: Secondary | ICD-10-CM | POA: Diagnosis not present

## 2020-11-01 NOTE — Progress Notes (Signed)
[name] ([id number]) Visit Report for 10/30/2020 Arrival Information Details Patient Name: Date of Service: [name], [name] 10/30/2020 1:15 PM Medical Record Number: [id number] Patient Account Number: [id number] Date of Birth/Sex: Treating RN: 09-19-1980 (40 y.o. [name], [name] Primary Care Norvil Martensen: [name] Other Clinician: Referring Juanmanuel Marohl: Treating Jonnatan Hanners/Extender: Mare Ferrari in Treatment: 34 Visit Information History Since Last Visit Added or deleted any medications: No Patient Arrived: Wheel Chair Any new allergies or adverse reactions: No Arrival Time: 13:24 Had a fall or experienced change in No Accompanied By: self activities of daily living that may affect Transfer Assistance: Harrel Lemon Lift risk of falls: Patient Identification Verified: Yes Signs or symptoms of abuse/neglect since last visito No Secondary Verification Process Completed: Yes Hospitalized since last visit: No Patient Requires Transmission-Based Precautions: No Implantable device outside of the clinic excluding No Patient Has Alerts: No cellular tissue based products placed in the center since last visit: Has Dressing in Place as Prescribed: Yes Pain Present Now: Yes Electronic Signature(s) Signed: 11/01/2020 5:19:32 PM By: Levan Hurst RN, BSN Entered By: Levan Hurst on 10/30/2020 14:13:09 -------------------------------------------------------------------------------- Clinic Level of Care Assessment Details Patient Name: Date of Service: [name] 10/30/2020 1:15 PM Medical Record Number: [id number] Patient Account Number: [id number] Date of Birth/Sex: Treating RN: 08-Jun-1980 (40 y.o. Nancy Fetter Primary Care Sherril Shipman: [name] Other Clinician: Referring Mirabelle Cyphers: Treating Romi Rathel/Extender: Mare Ferrari in Treatment: 5 Clinic Level of Care Assessment Items TOOL 4 Quantity Score X- 1  0 Use when only an EandM is performed on FOLLOW-UP visit ASSESSMENTS - Nursing Assessment / Reassessment X- 1 10 Reassessment of Co-morbidities (includes updates in patient status) X- 1 5 Reassessment of Adherence to Treatment Plan ASSESSMENTS - Wound and Skin A ssessment / Reassessment X - Simple Wound Assessment / Reassessment - one wound 1 5 '[]'  - 0 Complex Wound Assessment / Reassessment - multiple wounds '[]'  - 0 Dermatologic / Skin Assessment (not related to wound area) ASSESSMENTS - Focused Assessment '[]'  - 0 Circumferential Edema Measurements - multi extremities '[]'  - 0 Nutritional Assessment / Counseling / Intervention '[]'  - 0 Lower Extremity Assessment (monofilament, tuning fork, pulses) '[]'  - 0 Peripheral Arterial Disease Assessment (using hand held doppler) ASSESSMENTS - Ostomy and/or Continence Assessment and Care '[]'  - 0 Incontinence Assessment and Management '[]'  - 0 Ostomy Care Assessment and Management (repouching, etc.) PROCESS - Coordination of Care X - Simple Patient / Family Education for ongoing care 1 15 '[]'  - 0 Complex (extensive) Patient / Family Education for ongoing care X- 1 10 Staff obtains Programmer, systems, Records, T Results / Process Orders est '[]'  - 0 Staff telephones HHA, Nursing Homes / Clarify orders / etc '[]'  - 0 Routine Transfer to another Facility (non-emergent condition) '[]'  - 0 Routine Hospital Admission (non-emergent condition) '[]'  - 0 New Admissions / Biomedical engineer / Ordering NPWT Apligraf, etc. , '[]'  - 0 Emergency Hospital Admission (emergent condition) X- 1 10 Simple Discharge Coordination '[]'  - 0 Complex (extensive) Discharge Coordination PROCESS - Special Needs '[]'  - 0 Pediatric / Minor Patient Management '[]'  - 0 Isolation Patient Management '[]'  - 0 Hearing / Language / Visual special needs '[]'  - 0 Assessment of Community assistance (transportation, D/C planning, etc.) '[]'  - 0 Additional assistance / Altered mentation '[]'  -  0 Support Surface(s) Assessment (bed, cushion, seat, etc.) INTERVENTIONS - Wound Cleansing / Measurement X - Simple Wound Cleansing - one wound 1 5 '[]'  - 0 Complex Wound Cleansing -  multiple wounds X- 1 5 Wound Imaging (photographs - any number of wounds) '[]'  - 0 Wound Tracing (instead of photographs) X- 1 5 Simple Wound Measurement - one wound '[]'  - 0 Complex Wound Measurement - multiple wounds INTERVENTIONS - Wound Dressings X - Small Wound Dressing one or multiple wounds 1 10 '[]'  - 0 Medium Wound Dressing one or multiple wounds '[]'  - 0 Large Wound Dressing one or multiple wounds '[]'  - 0 Application of Medications - topical '[]'  - 0 Application of Medications - injection INTERVENTIONS - Miscellaneous '[]'  - 0 External ear exam '[]'  - 0 Specimen Collection (cultures, biopsies, blood, body fluids, etc.) '[]'  - 0 Specimen(s) / Culture(s) sent or taken to Lab for analysis X- 1 10 Patient Transfer (multiple staff / Harrel Lemon Lift / Similar devices) '[]'  - 0 Simple Staple / Suture removal (25 or less) '[]'  - 0 Complex Staple / Suture removal (26 or more) '[]'  - 0 Hypo / Hyperglycemic Management (close monitor of Blood Glucose) '[]'  - 0 Ankle / Brachial Index (ABI) - do not check if billed separately X- 1 5 Vital Signs Has the patient been seen at the hospital within the last three years: Yes Total Score: 95 Level Of Care: New/Established - Level 3 Electronic Signature(s) Signed: 11/01/2020 5:19:32 PM By: Levan Hurst RN, BSN Entered By: Levan Hurst on 10/30/2020 14:12:08 -------------------------------------------------------------------------------- Encounter Discharge Information Details Patient Name: Date of Service: Jamie Boyden A. 10/30/2020 1:15 PM Medical Record Number: [id number] Patient Account Number: [id number] Date of Birth/Sex: Treating RN: 02-28-1981 (40 y.o. Nancy Fetter Primary Care Kyana Aicher: [name] Other Clinician: Referring Jefrey Raburn: Treating  Kaimen Peine/Extender: Mare Ferrari in Treatment: 79 Encounter Discharge Information Items Discharge Condition: Stable Ambulatory Status: Wheelchair Discharge Destination: Home Transportation: Private Auto Accompanied By: alone Schedule Follow-up Appointment: Yes Clinical Summary of Care: Patient Declined Electronic Signature(s) Signed: 11/01/2020 5:19:32 PM By: Levan Hurst RN, BSN Entered By: Levan Hurst on 10/30/2020 14:13:01 -------------------------------------------------------------------------------- Multi Wound Chart Details Patient Name: Date of Service: Jamie Boyden A. 10/30/2020 1:15 PM Medical Record Number: [id number] Patient Account Number: [id number] Date of Birth/Sex: Treating RN: 1980-11-07 (40 y.o. [name], [name] Primary Care Dallana Mavity: [name] Other Clinician: Referring Jawanza Zambito: Treating Angad Nabers/Extender: Mare Ferrari in Treatment: 226 Vital Signs Height(in): 65 Pulse(bpm): 81 Weight(lbs): 201 Blood Pressure(mmHg): 168/99 Body Mass Index(BMI): 33 Temperature(F): 98.6 Respiratory Rate(breaths/min): 18 Photos: [1:Medial Sacrum] [N/A:N/A N/A] Wound Location: [1:Pressure Injury] [N/A:N/A] Wounding Event: [1:Pressure Ulcer] [N/A:N/A] Primary Etiology: [1:Anemia, Osteomyelitis] [N/A:N/A] Comorbid History: [1:05/15/2016] [N/A:N/A] Date Acquired: [1:226] [N/A:N/A] Weeks of Treatment: [1:Open] [N/A:N/A] Wound Status: [1:1x0.8x0.8] [N/A:N/A] Measurements L x W x D (cm) [1:0.628] [N/A:N/A] A (cm) : rea [1:0.503] [N/A:N/A] Volume (cm) : [1:98.10%] [N/A:N/A] % Reduction in A rea: [1:99.70%] [N/A:N/A] % Reduction in Volume: [1:1] Starting Position 1 (o'clock): [1:5] Ending Position 1 (o'clock): [1:1.1] Maximum Distance 1 (cm): [1:Yes] [N/A:N/A] Undermining: [1:Category/Stage IV] [N/A:N/A] Classification: [1:Medium] [N/A:N/A] Exudate A mount: [1:Serosanguineous] [N/A:N/A] Exudate Type:  [1:red, brown] [N/A:N/A] Exudate Color: [1:Well defined, not attached] [N/A:N/A] Wound Margin: [1:Small (1-33%)] [N/A:N/A] Granulation A mount: [1:Pink] [N/A:N/A] Granulation Quality: [1:Large (67-100%)] [N/A:N/A] Necrotic A mount: [1:Fat Layer (Subcutaneous Tissue): Yes N/A] Exposed Structures: [1:Fascia: No Tendon: No Muscle: No Joint: No Bone: No Small (1-33%)] [N/A:N/A] Treatment Notes Electronic Signature(s) Signed: 10/30/2020 5:24:19 PM By: Rhae Hammock RN Signed: 10/31/2020 7:53:49 AM By: Linton Ham MD Entered By: Linton Ham on 10/30/2020 13:44:14 -------------------------------------------------------------------------------- Multi-Disciplinary Care Plan Details Patient Name: Date of Service: Jamie Boyden A.  10/30/2020 1:15 PM Medical Record Number: [id number] Patient Account Number: [id number] Date of Birth/Sex: Treating RN: [birth year] (40 y.o. Nancy Fetter Primary Care Yader Criger: [name] Other Clinician: Referring Mystique Bjelland: Treating Rashawn Rolon/Extender: Mare Ferrari in Treatment: [place] reviewed with physician Active Inactive Wound/Skin Impairment Nursing Diagnoses: Impaired tissue integrity Knowledge deficit related to smoking impact on wound healing Knowledge deficit related to ulceration/compromised skin integrity Goals: Patient will demonstrate a reduced rate of smoking or cessation of smoking Date Initiated: 06/27/2016 Date Inactivated: 01/26/2017 Target Resolution Date: 01/08/2017 Goal Status: Unmet Unmet Reason: pt continues to smoke Patient/caregiver will verbalize understanding of skin care regimen Date Initiated: 06/27/2016 Target Resolution Date: 11/30/2020 Goal Status: Active Ulcer/skin breakdown will have a volume reduction of 50% by week 8 Date Initiated: 06/27/2016 Date Inactivated: 12/11/2016 Target Resolution Date: 07/25/2016 Goal Status: Met Interventions: Assess  patient/caregiver ability to obtain necessary supplies Assess patient/caregiver ability to perform ulcer/skin care regimen upon admission and as needed Assess ulceration(s) every visit Provide education on smoking Provide education on ulcer and skin care Treatment Activities: Skin care regimen initiated : 06/27/2016 Topical wound management initiated : 06/27/2016 Notes: Electronic Signature(s) Signed: 11/01/2020 5:19:32 PM By: Levan Hurst RN, BSN Entered By: Levan Hurst on 10/30/2020 13:38:34 -------------------------------------------------------------------------------- Pain Assessment Details Patient Name: Date of Service: YAREL, KILCREASE A. 10/30/2020 1:15 PM Medical Record Number: [id number] Patient Account Number: [id number] Date of Birth/Sex: Treating RN: 08/30/1980 (40 y.o. [name], [name] Primary Care Lashanda Storlie: [name] Other Clinician: Referring Gracen Ringwald: Treating Karletta Millay/Extender: Mare Ferrari in Treatment: 226 Active Problems Location of Pain Severity and Description of Pain Patient Has Paino Yes Site Locations Rate the pain. Current Pain Level: 7 Pain Management and Medication Current Pain Management: Electronic Signature(s) Signed: 10/30/2020 3:39:02 PM By: Sandre Kitty Signed: 10/30/2020 5:24:19 PM By: Rhae Hammock RN Entered By: Sandre Kitty on 10/30/2020 13:25:12 -------------------------------------------------------------------------------- Patient/Caregiver Education Details Patient Name: Date of Service: Karlton Lemon 8/30/2022andnbsp1:15 PM Medical Record Number: [id number] Patient Account Number: [id number] Date of Birth/Gender: Treating RN: [birth year] (40 y.o. Nancy Fetter Primary Care Physician: [name] Other Clinician: Referring Physician: Treating Physician/Extender: Mare Ferrari in Treatment: 16 Education Assessment Education Provided  To: Patient Education Topics Provided Wound/Skin Impairment: Methods: Explain/Verbal Responses: State content correctly Electronic Signature(s) Signed: 11/01/2020 5:19:32 PM By: Levan Hurst RN, BSN Entered By: Levan Hurst on 10/30/2020 13:38:48 -------------------------------------------------------------------------------- Wound Assessment Details Patient Name: Date of Service: LAVERA, VANDERMEER 10/30/2020 1:15 PM Medical Record Number: [id number] Patient Account Number: [id number] Date of Birth/Sex: Treating RN: October 22, 1980 (40 y.o. [name], [name] Primary Care Sung Renton: [name] Other Clinician: Referring Quadasia Newsham: Treating Kanyon Seibold/Extender: Mare Ferrari in Treatment: 226 Wound Status Wound Number: 1 Primary Etiology: Pressure Ulcer Wound Location: Medial Sacrum Wound Status: Open Wounding Event: Pressure Injury Comorbid History: Anemia, Osteomyelitis Date Acquired: 05/15/2016 Weeks Of Treatment: 226 Clustered Wound: No Photos Wound Measurements Length: (cm) 1 Width: (cm) 0.8 Depth: (cm) 0.8 Area: (cm) 0.628 Volume: (cm) 0.503 % Reduction in Area: 98.1% % Reduction in Volume: 99.7% Epithelialization: Small (1-33%) Tunneling: No Undermining: Yes Starting Position (o'clock): 1 Ending Position (o'clock): 5 Maximum Distance: (cm) 1.1 Wound Description Classification: Category/Stage IV Wound Margin: Well defined, not attached Exudate Amount: Medium Exudate Type: Serosanguineous Exudate Color: red, brown Foul Odor After Cleansing: No Slough/Fibrino Yes Wound Bed Granulation Amount: Small (1-33%) Exposed Structure Granulation Quality: Pink Fascia Exposed: No Necrotic Amount: Large (67-100%) Fat Layer (Subcutaneous Tissue) Exposed:  Yes Necrotic Quality: Adherent Slough Tendon Exposed: No Muscle Exposed: No Joint Exposed: No Bone Exposed: No Treatment Notes Wound #1 (Sacrum) Wound Laterality:  Medial Cleanser Soap and Water Discharge Instruction: May shower and wash wound with dial antibacterial soap and water prior to dressing change. Wound Cleanser Discharge Instruction: Cleanse the wound with wound cleanser prior to applying a clean dressing using gauze sponges, not tissue or cotton balls. Peri-Wound Care Topical Primary Dressing Anasept moistened gauze Discharge Instruction: Moisten gauze with anasept and pack gently into wound Secondary Dressing Woven Gauze Sponge, Non-Sterile 4x4 in Discharge Instruction: Apply over primary dressing as directed. ABD Pad, 5x9 Discharge Instruction: Apply over primary dressing as directed. Secured With 67M [place] Surgical T ape, 2x2 (in/yd) Discharge Instruction: Secure dressing with tape as directed. Compression Wrap Compression Stockings Add-Ons Electronic Signature(s) Signed: 10/30/2020 5:24:19 PM By: Rhae Hammock RN Signed: 11/01/2020 5:19:32 PM By: Levan Hurst RN, BSN Entered By: Levan Hurst on 10/30/2020 13:32:11 -------------------------------------------------------------------------------- Vitals Details Patient Name: Date of Service: NESHA, COUNIHAN 10/30/2020 1:15 PM Medical Record Number: [id number] Patient Account Number: [id number] Date of Birth/Sex: Treating RN: February 08, 1981 (40 y.o. [name], [name] Primary Care Amalio Loe: Other Clinician: [name] Referring Asuna Peth: Treating Zikeria Keough/Extender: Mare Ferrari in Treatment: 226 Vital Signs Time Taken: 13:24 Temperature (F): 98.6 Height (in): 65 Pulse (bpm): 81 Weight (lbs): 201 Respiratory Rate (breaths/min): 18 Body Mass Index (BMI): 33.4 Blood Pressure (mmHg): 168/99 Reference Range: 80 - 120 mg / dl Electronic Signature(s) Signed: 10/30/2020 3:39:02 PM By: Sandre Kitty Entered By: Sandre Kitty on 10/30/2020 13:24:54

## 2020-11-01 NOTE — Progress Notes (Signed)
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 completely healed up. We are using silver alginate and patient states that she has been doing better with offloading from the sacrum 9/25oopatient was hospitalized from 9/6 through 9/11. She had strep sepsis. I think this was ultimately felt  to be secondary to pyelonephritis. She did have a CT scan of the abdomen and pelvis. Noted there was bone destruction within the coccyx however this was present on a prior study which was felt to be stable when compared with the study from April 2020. Likely related to chronic osteomyelitis previously treated. 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 She was treated with 6 weeks of oral doxycycline by [name]. She already she also received IV antibiotics. We have been using silver alginate to the wound. Everything else is healed up except for the probing wound on the sacrum 11/16; patient is here after an extended hiatus again. She has the small probing wound on her sacrum which we have been using silver alginate . Since she was last here she was apparently had placement of a baclofen pump. Apparently the surgical site at the lower left lumbar area became infected she ended up in hospital from 11/6 through 11/7 with wound dehiscence and probable infection. Her surgeon Dr. Christella Noa open the wound debrided it took cultures and placed the wound VAC. She is now receiving IV antibiotics at the  direction of infectious disease which I think is ertapenem for 20 days. 03/09/2019 on evaluation today patient appears to be doing quite well with regard to her wound. Its been since 01/17/2019 when we last saw her. She subsequently ended up in the hospital due to a baclofen pump which became infected. She has been on antibiotics as prescribed by infectious disease for this and she was seeing Dr. Linus Salmons at [facility] for infectious disease. Subsequently she has been on doxycycline through November 29. The baclofen pump was placed on 12/22/2018 by Dr. Lovenia Shuck. Unfortunately the patient developed a wound infection and underwent debridement by Dr. Christella Noa on 01/08/2019. The growth in the culture revealed ESBL E. coli and diphtheroids and the patient was initially placed on ertapenem him in doxycycline for 3  weeks. Subsequently she comes in today for reevaluation and it does appear that her wound is actually doing significantly better even compared to last time we saw her. This is in regard to the medial sacral region. 2/5; I have not seen this wound in almost 3 months. Certainly has gotten better in terms of surface area although there is significant direct depth. She has completed antibiotics for the underlying osteomyelitis. 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 She has been using silver alginate. Her mother is changing the dressing. She claims to be offloading this area except for when she gets up to go to doctors appointments 3/12; this is a punched-out wound on the lower sacrum. Has a depth of about 1.8 cm. It does not appear to undermine. There is Jamie palpable bone. We have been using silver alginate packing her mother is changing the dressing she claims to be offloading this. She had a baclofen pump placed to alleviate her spasticity 5/17; the patient has not been seen in over 2 months. 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 Apparently they started to notice an odor and drainage earlier this month. Patient was admitted to [facility] from 5/3 through 07/12/2019. We do not have any records here. Apparently she was found to have pneumonia, a UTI. She also went underwent a surgical debridement of her lower sacral wound. She comes in today with a really large postoperative wound. This does not have exposed bone but very close. This is right back to where this was a year or 2 ago. They have been using wet-to-dry. She is on an IV antibiotic but is not sure what drugs. We are not sure what home health company they are currently using. We are trying to get records from [employer] 6/8; this the patient return to clinic 3 weeks ago. She had surgical debridement of the lower sacral wound.  She apparently is completed her IV antibiotics although I am not exactly sure what IV antibiotics she had ordered. Her PICC line is come out. Her wound is large but generally clean there still is exposed bone. ooFortunately the area on the right heel is callused over I cannot prove there is an open area here per 6/22; they are using a wet to dry dressing when we left the clinic last time however they managed to find a box of silver alginate so they are using that with backing gauze. They are still changing this twice a day because of drainage issues. She has Medicaid and they will not be able to replace the want dressing she  will have to go back to a wet-to-dry. In spite of this the wound actually looks quite good some improvement in dimension 7/13; using silver alginate. Slight improvement in overall wound volume including depth although there is still undermining. She tells me she is offloading this is much as possible 8/10-Patient back at 1 month, continuing to use silver alginate although she has run out and therefore using saline wet-to-dry, undermining is minimal, continuing attempts at offloading according to patient 9/21; its been about 9 weeks since I have seen this patient although I note she was seen once in August. Her wounds on the lower sacrum this looks a lot better than the last time I saw this. She is using silver alginate to the wound bed. They only have Medicaid therefore they have to need to pay out-of-pocket for the dressing supplies. This certainly limits options. 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 10/19; 1 month follow-up. Not much change in the wound in fact it is measuring slightly larger. Still using silver alginate which her mother is purchasing off [employer] I believe. Since she was here she had a suprapubic catheter placed because of a lacerated urethra. 11/30; patient has not been here in  about 6 weeks. She apparently has had 3 admissions to the hospital for pneumonia in the last 7 months 2 in the last 2 months. She came out of hospital this time with 4 L of oxygen. Her mother is using the Anasept wet-to-dry to the sacral wound and surprisingly this looks somewhat better. The patient states she is pending 24/7 in bed except for going to doctor's appointments this may be helping. She has an appointment with pulmonology on 12/17. She was a heavy smoker for a period of time I wonder whether she has COPD 12/28; patient is doing well she is off her oxygen which may have something to do with chronic Macrobid she was on [hypersensitivity pneumonitis]. She is also stop smoking. In any case she is doing well from a breathing point of view. She is using Anasept wet-to-dry. 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 She has been using Anasept gel wet-to-dry and really has done a nice job here. She does not have insurance for other wound care products but truthfully so far this is done a really good job. I note she is back on oxygen. 2/22; stage IV wound on her lower sacrum. They have been to using an Anasept gel wet-to-dry-based dressing. Ordinarily I would like to use a moistened collagen wet-to-dry but she is apparently not able to afford this. There was also some suggestion about using Dakin's but apparently her mother could not make 3/14; 3-week follow-up. She is going for bladder surgery at [facility] next week. Still using Anasept gel wet-to-dry. We will try to get Prisma wet to dry through what I think is [facility]. This probably will not work 4/19; 4-week follow-up. She is using collagen with wet-to-dry dressings every day. She reports improvement to the wound healing. Patient had bladder neck surgery with suprapubic catheter placement on 3/22. On 3/30 she was sent to the emergency department because of hypotension.  She was found to be in septic shock in the setting of ESBL E. coli UTI. Currently she is doing well. 5/17; 4-week follow-up. She is using collagen with backing wet-to-dry. Really nice improvement in surface area  of these wounds depth at 1.2 cm. She has had problems with urinary leakage. She does have a suprapubic catheter 6/14; 4-week follow-up. She was doing really well the last time we saw her in the wound had filled in quite a bit. However since that time her wound is gotten a lot deeper. Apparently her bladder surgery failed and she is incontinent a lot of the time. Furthermore her mother suffered a stroke and is staying with her sister. She now has care attendance but I am not really certain about the amount of care she has. We have been using silver collagen but apparently the family has to buy this privately. 6/28; the wound measures slightly larger I certainly do not see the difference. It has 1.5 cm of direct depth but not exposed to bone it does not appear to be infected. We had her using silver alginate although she does not seem to know what her mother's been dressing this with recently 7/12; 2-week follow-up. 1.7 mm of direct depth this is fluctuated a fair amount but I do not see any consistent trend towards closure. The tissue looks healthy minimal undermining Jamie palpable bone. Jamie evidence of surrounding infection. We have been using silver alginate wet-to-dry although the patient wants to go back to Anasept gel wet to dry 8/30; we have seen this patient in quite a while however her wound has come in nicely at roughly 0.8 or 0.9 mm of direct depth also down in length and width. She is using Anasept wet-to-dry her mother is changing the dressing Objective Constitutional Patient is hypertensive.. Pulse regular and within target range for patient.[name] Respirations regular, non-labored and within target range.. Temperature is normal and within the target range for the patient.[name] Appears in  Jamie distress. Vitals Time Taken: 1:24 PM, Height: 65 in, Weight: 201 lbs, BMI: 33.4, Temperature: 98.6 F, Pulse: 81 bpm, Respiratory Rate: 18 breaths/min, Blood Pressure: 168/99 mmHg. General Notes: Wound exam; depth at 0.8-0.9. Jamie palpable bone. Tissue around the wound looks healthy Jamie drainage Integumentary (Hair, Skin) Wound #1 status is Open. Original cause of wound was Pressure Injury. The date acquired was: 05/15/2016. The wound has been in treatment 226 weeks. The wound is located on the Medial Sacrum. The wound measures 1cm length x 0.8cm width x 0.8cm depth; 0.628cm^2 area and 0.503cm^3 volume. There is Fat Layer (Subcutaneous Tissue) exposed. There is Jamie tunneling noted, however, there is undermining starting at 1:00 and ending at 5:00 with a maximum distance of 1.1cm. There is a medium amount of serosanguineous drainage noted. The wound margin is well defined and not attached to the wound base. There is small (1-33%) pink granulation within the wound bed. There is a large (67-100%) amount of necrotic tissue within the wound bed including Adherent Slough. Assessment Active Problems ICD-10 Pressure ulcer of sacral region, stage 4 Paraplegia, incomplete Plan Follow-up Appointments: Return appointment in 1 month. - with Dr. Dellia Nims - ****[facility]**** Bathing/ Shower/ Hygiene: May shower with protection but do not get wound dressing(s) wet. Off-Loading: Turn and reposition every 2 hours WOUND #1: - Sacrum Wound Laterality: Medial Cleanser: Soap and Water 2 x Per Day/30 Days Discharge Instructions: May shower and wash wound with dial antibacterial soap and water prior to dressing change. Cleanser: Wound Cleanser (Generic) 2 x Per Day/30 Days Discharge Instructions: Cleanse the wound with wound cleanser prior to applying a clean dressing using gauze sponges, not tissue or cotton balls. Prim Dressing: Anasept moistened gauze 2 x Per Day/30 Days ary  Discharge Instructions: Moisten gauze  with anasept and pack gently into wound Secondary Dressing: Woven Gauze Sponge, Non-Sterile 4x4 in (Generic) 2 x Per Day/30 Days Discharge Instructions: Apply over primary dressing as directed. Secondary Dressing: ABD Pad, 5x9 (Generic) 2 x Per Day/30 Days Discharge Instructions: Apply over primary dressing as directed. Secured With: 11M Medipore H Soft Cloth Surgical T ape, 2x2 (in/yd) (Generic) 2 x Per Day/30 Days Discharge Instructions: Secure dressing with tape as directed. 1. At this point I did not see any need to change the dressing which is Anasept wet-to-dry. This seems to have done well for 2. Her mother is changing the dressing. Alternatives would be some form of collagen wet to dry although right now things seem to be grip progressing to the point where that may be unnecessary unless the wound stalls. 3. Jamie evidence of infection Electronic Signature(s) Signed: 10/31/2020 7:53:49 AM By: Linton Ham MD Entered By: Linton Ham on 10/30/2020 13:50:59 -------------------------------------------------------------------------------- SuperBill Details Patient Name: Date of Service: ATASIA, GLEAVES 10/30/2020 Medical Record Number: [id number] Patient Account Number: [id number] Date of Birth/Sex: Treating RN: 1981-02-18 (40 y.o. Tonita [name], Jamie Primary Care Provider: [name] Other Clinician: Referring Provider: Treating Provider/Extender: Mare Ferrari in Treatment: 226 Diagnosis Coding ICD-10 Codes Code Description [phone] Pressure ulcer of sacral region, stage 4 G82.22 Paraplegia, incomplete Facility Procedures Physician Procedures : CPT4 Code Description Modifier [id number] - WC PHYS LEVEL 2 - EST PT ICD-10 Diagnosis Description L89.154 Pressure ulcer of sacral region, stage 4 G82.22 Paraplegia, incomplete Quantity: 1 Electronic Signature(s) Signed: 10/31/2020 7:53:49 AM By: Linton Ham MD Signed: 11/01/2020 5:19:32 PM By:  Levan Hurst RN, BSN Entered By: Levan Hurst on 10/30/2020 14:12:14

## 2020-11-16 ENCOUNTER — Other Ambulatory Visit (HOSPITAL_COMMUNITY): Payer: Self-pay | Admitting: Student

## 2020-11-16 ENCOUNTER — Other Ambulatory Visit: Payer: Self-pay | Admitting: Family Medicine

## 2020-11-16 DIAGNOSIS — M5412 Radiculopathy, cervical region: Secondary | ICD-10-CM

## 2020-11-16 DIAGNOSIS — B372 Candidiasis of skin and nail: Secondary | ICD-10-CM

## 2020-11-21 ENCOUNTER — Other Ambulatory Visit: Payer: Self-pay

## 2020-11-21 ENCOUNTER — Ambulatory Visit (INDEPENDENT_AMBULATORY_CARE_PROVIDER_SITE_OTHER): Payer: Medicaid Other | Admitting: Family Medicine

## 2020-11-21 ENCOUNTER — Encounter: Payer: Self-pay | Admitting: Family Medicine

## 2020-11-21 ENCOUNTER — Other Ambulatory Visit: Payer: Self-pay | Admitting: Family Medicine

## 2020-11-21 VITALS — BP 122/78 | HR 85 | Temp 97.0°F

## 2020-11-21 DIAGNOSIS — I10 Essential (primary) hypertension: Secondary | ICD-10-CM

## 2020-11-21 DIAGNOSIS — R3989 Other symptoms and signs involving the genitourinary system: Secondary | ICD-10-CM | POA: Diagnosis not present

## 2020-11-21 DIAGNOSIS — F411 Generalized anxiety disorder: Secondary | ICD-10-CM | POA: Diagnosis not present

## 2020-11-21 DIAGNOSIS — Z23 Encounter for immunization: Secondary | ICD-10-CM | POA: Diagnosis not present

## 2020-11-21 DIAGNOSIS — E038 Other specified hypothyroidism: Secondary | ICD-10-CM

## 2020-11-21 DIAGNOSIS — Z79899 Other long term (current) drug therapy: Secondary | ICD-10-CM

## 2020-11-21 DIAGNOSIS — G8929 Other chronic pain: Secondary | ICD-10-CM

## 2020-11-21 DIAGNOSIS — F339 Major depressive disorder, recurrent, unspecified: Secondary | ICD-10-CM

## 2020-11-21 DIAGNOSIS — E782 Mixed hyperlipidemia: Secondary | ICD-10-CM

## 2020-11-21 MED ORDER — LOSARTAN POTASSIUM 100 MG PO TABS: 100.0000 mg | ORAL_TABLET | Freq: Every day | ORAL | 1 refills | Status: DC

## 2020-11-21 MED ORDER — OXYCODONE HCL 10 MG PO TABS: 10.0000 mg | ORAL_TABLET | Freq: Three times a day (TID) | ORAL | 0 refills | Status: DC

## 2020-11-21 MED ORDER — CYCLOBENZAPRINE HCL 5 MG PO TABS: 5.0000 mg | ORAL_TABLET | Freq: Three times a day (TID) | ORAL | 5 refills | Status: DC | PRN

## 2020-11-21 MED ORDER — OXYBUTYNIN CHLORIDE ER 5 MG PO TB24: 5.0000 mg | ORAL_TABLET | Freq: Every day | ORAL | 1 refills | Status: DC

## 2020-11-21 MED ORDER — KETOROLAC TROMETHAMINE 60 MG/2ML IM SOLN
60.0000 mg | Freq: Once | INTRAMUSCULAR | Status: AC
  Administered 2020-11-21: 60 mg via INTRAMUSCULAR

## 2020-11-21 MED ORDER — BUPROPION HCL ER (SR) 150 MG PO TB12: 150.0000 mg | ORAL_TABLET | Freq: Two times a day (BID) | ORAL | 1 refills | Status: DC
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
## 2020-11-22 LAB — CBC WITH DIFFERENTIAL/PLATELET
Basophils Absolute: 0 10*3/uL (ref 0.0–0.2)
Basos: 1 %
EOS (ABSOLUTE): 0.4 10*3/uL (ref 0.0–0.4)
Eos: 5 %
Hematocrit: 34 % (ref 34.0–46.6)
Hemoglobin: 10.5 g/dL — ABNORMAL LOW (ref 11.1–15.9)
Immature Grans (Abs): 0 10*3/uL (ref 0.0–0.1)
Immature Granulocytes: 0 %
Lymphocytes Absolute: 2.4 10*3/uL (ref 0.7–3.1)
Lymphs: 30 %
MCH: 26.9 pg (ref 26.6–33.0)
MCHC: 30.9 g/dL — ABNORMAL LOW (ref 31.5–35.7)
MCV: 87 fL (ref 79–97)
Monocytes Absolute: 0.5 10*3/uL (ref 0.1–0.9)
Monocytes: 6 %
Neutrophils Absolute: 4.6 10*3/uL (ref 1.4–7.0)
Neutrophils: 58 %
Platelets: 502 10*3/uL — ABNORMAL HIGH (ref 150–450)
RBC: 3.9 x10E6/uL (ref 3.77–5.28)
RDW: 14 % (ref 11.7–15.4)
WBC: 7.9 10*3/uL (ref 3.4–10.8)

## 2020-11-22 LAB — CMP14+EGFR
ALT: 19 IU/L (ref 0–32)
AST: 20 IU/L (ref 0–40)
Albumin/Globulin Ratio: 1.8 (ref 1.2–2.2)
Albumin: 4.1 g/dL (ref 3.8–4.8)
Alkaline Phosphatase: 103 IU/L (ref 44–121)
BUN/Creatinine Ratio: 13 (ref 9–23)
BUN: 8 mg/dL (ref 6–20)
Bilirubin Total: <0.2 mg/dL (ref 0.0–1.2)
CO2: 26 mmol/L (ref 20–29)
Calcium: 8.8 mg/dL (ref 8.7–10.2)
Chloride: 99 mmol/L (ref 96–106)
Creatinine, Ser: 0.64 mg/dL (ref 0.57–1.00)
Globulin, Total: 2.3 g/dL (ref 1.5–4.5)
Glucose: 127 mg/dL — ABNORMAL HIGH (ref 65–99)
Potassium: 4.8 mmol/L (ref 3.5–5.2)
Sodium: 138 mmol/L (ref 134–144)
Total Protein: 6.4 g/dL (ref 6.0–8.5)
eGFR: 116 mL/min/{1.73_m2} (ref >59)

## 2020-11-22 LAB — TSH: TSH: 3.07 u[IU]/mL (ref 0.450–4.500)

## 2020-11-22 LAB — LIPID PANEL
Chol/HDL Ratio: 4.6 ratio — ABNORMAL HIGH (ref 0.0–4.4)
Cholesterol, Total: 169 mg/dL (ref 100–199)
HDL: 37 mg/dL — ABNORMAL LOW (ref >39)
LDL Chol Calc (NIH): 110 mg/dL — ABNORMAL HIGH (ref 0–99)
Triglycerides: 124 mg/dL (ref 0–149)
VLDL Cholesterol Cal: 22 mg/dL (ref 5–40)

## 2020-11-22 LAB — T4, FREE: Free T4: 1.23 ng/dL (ref 0.82–1.77)

## 2020-11-23 LAB — SPECIMEN STATUS REPORT

## 2020-11-23 LAB — HGB A1C W/O EAG: Hgb A1c MFr Bld: 5.3 % (ref 4.8–5.6)

## 2020-11-25 LAB — URINE CULTURE

## 2020-11-27 ENCOUNTER — Encounter (HOSPITAL_BASED_OUTPATIENT_CLINIC_OR_DEPARTMENT_OTHER): Payer: Medicaid Other | Attending: Internal Medicine | Admitting: Internal Medicine

## 2020-11-27 ENCOUNTER — Other Ambulatory Visit: Payer: Self-pay

## 2020-11-27 DIAGNOSIS — L89154 Pressure ulcer of sacral region, stage 4: Secondary | ICD-10-CM | POA: Insufficient documentation

## 2020-11-27 DIAGNOSIS — Z87891 Personal history of nicotine dependence: Secondary | ICD-10-CM | POA: Diagnosis not present

## 2020-11-27 DIAGNOSIS — G8222 Paraplegia, incomplete: Secondary | ICD-10-CM | POA: Insufficient documentation

## 2020-11-27 LAB — TOXASSURE SELECT 13 (MW), URINE

## 2020-11-28 NOTE — Progress Notes (Addendum)
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 lower sacrum. Not much change from last time. We have been using silver collagen for a long period of time. She has a new wound that she says came up about 2 weeks ago perhaps from leaning her  right leg up on a hot metal stool in the sun. But she has not really sure of this history. 8/14-Patient comes in after a month the major wound on the lower sacrum is measuring less than depth compared to last time, the right leg injury has completely healed up. We are using silver alginate and patient states that she has been doing better with offloading from the sacrum 9/25oopatient was hospitalized from 9/6 through 9/11. She had strep sepsis. I think this was ultimately felt to be secondary to pyelonephritis. She did have a CT scan of the abdomen and pelvis. Noted there was bone destruction within the coccyx however this was present on a prior study which was felt to be stable when compared with the study from April 2020. Likely related to chronic osteomyelitis previously treated. 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 She was treated with 6 weeks of oral doxycycline by [name]. She already she also received IV antibiotics. We have been using silver alginate to the wound. Everything else is healed up except for the probing wound on the sacrum 11/16;  patient is here after an extended hiatus again. She has the small probing wound on her sacrum which we have been using silver alginate . Since she was last here she was apparently had placement of a baclofen pump. Apparently the surgical site at the lower left lumbar area became infected she ended up in hospital from 11/6 through 11/7 with wound dehiscence and probable infection. Her surgeon Dr. Christella Noa open the wound debrided it took cultures and placed the wound VAC. She is now receiving IV antibiotics at the direction of infectious disease which I think is ertapenem for 20 days. 03/09/2019 on evaluation today patient appears to be doing quite well with regard to her wound. Its been since 01/17/2019 when we last saw her. She subsequently ended up in the hospital due to a baclofen pump which became infected. She has been on antibiotics  as prescribed by infectious disease for this and she was seeing Dr. Linus Salmons at [facility] for infectious disease. Subsequently she has been on doxycycline through November 29. The baclofen pump was placed on 12/22/2018 by Dr. Lovenia Shuck. Unfortunately the patient developed a wound infection and underwent debridement by Dr. Christella Noa on 01/08/2019. The growth in the culture revealed ESBL E. coli and diphtheroids and the patient was initially placed on ertapenem him in doxycycline for 3 weeks. Subsequently she comes in today for reevaluation and it does appear that her wound is actually doing significantly better even compared to last time we saw her. This is in regard to the medial sacral region. 2/5; I have not seen this wound in almost 3 months. Certainly has gotten better in terms of surface area although there is significant direct depth. She has completed antibiotics for the underlying osteomyelitis. 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 She has been using silver alginate. Her mother is changing the dressing. She claims to be offloading this area except for when she gets up to go to doctors appointments 3/12; this is a punched-out wound on the lower sacrum. Has a depth of about 1.8 cm. It does not appear to undermine. There is no palpable bone. We have been using silver alginate packing her mother is changing the dressing she claims to be offloading this. She had a baclofen pump placed to alleviate her spasticity 5/17; the patient has not been seen in over 2 months. 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 Apparently they started to notice an odor and drainage earlier this month. Patient was admitted to [facility] from 5/3 through 07/12/2019. We do not have any records here. Apparently she was found to have pneumonia, a UTI. She also went underwent a surgical debridement of her  lower sacral wound. She comes in today with a really large postoperative wound. This does not have exposed bone but very close. This is right back to where this was a year or 2 ago. They have been using wet-to-dry. She is on an IV antibiotic but is not sure what drugs. We are not sure what home health company they are currently using. We are trying to get records from [facility]. 6/8; this the patient return to clinic 3 weeks ago. She had surgical debridement of the lower sacral wound. She apparently is completed her IV antibiotics although I am not exactly sure what IV antibiotics she had ordered. Her PICC line is come out. Her wound is  large but generally clean there still is exposed bone. ooFortunately the area on the right heel is callused over I cannot prove there is an open area here per 6/22; they are using a wet to dry dressing when we left the clinic last time however they managed to find a box of silver alginate so they are using that with backing gauze. They are still changing this twice a day because of drainage issues. She has Medicaid and they will not be able to replace the want dressing she will have to go back to a wet-to-dry. In spite of this the wound actually looks quite good some improvement in dimension 7/13; using silver alginate. Slight improvement in overall wound volume including depth although there is still undermining. She tells me she is offloading this is much as possible 8/10-Patient back at 1 month, continuing to use silver alginate although she has run out and therefore using saline wet-to-dry, undermining is minimal, continuing attempts at offloading according to patient 9/21; its been about 9 weeks since I have seen this patient although I note she was seen once in August. Her wounds on the lower sacrum this looks a lot better than the last time I saw this. She is using silver alginate to the wound bed. They only have Medicaid therefore they have to need to pay  out-of-pocket for the dressing supplies. This certainly limits options. 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 10/19; 1 month follow-up. Not much change in the wound in fact it is measuring slightly larger. Still using silver alginate which her mother is purchasing off [employer] I believe. Since she was here she had a suprapubic catheter placed because of a lacerated urethra. 11/30; patient has not been here in about 6 weeks. She apparently has had 3 admissions to the hospital for pneumonia in the last 7 months 2 in the last 2 months. She came out of hospital this time with 4 L of oxygen. Her mother is using the Anasept wet-to-dry to the sacral wound and surprisingly this looks somewhat better. The patient states she is pending 24/7 in bed except for going to doctor's appointments this may be helping. She has an appointment with pulmonology on 12/17. She was a heavy smoker for a period of time I wonder whether she has COPD 12/28; patient is doing well she is off her oxygen which may have something to do with chronic Macrobid she was on [hypersensitivity pneumonitis]. She is also stop smoking. In any case she is doing well from a breathing point of view. She is using Anasept wet-to-dry. 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 She has been using Anasept gel wet-to-dry and really has done a nice job here. She does not have insurance for other wound care products but truthfully so far this is done a really good job. I note she is back on oxygen. 2/22; stage IV wound on her lower sacrum. They have been to using an Anasept gel wet-to-dry-based dressing. Ordinarily I would like to use a moistened collagen wet-to-dry but she is apparently not able to afford this. There was also some suggestion about using Dakin's but apparently her mother could not make 3/14;  3-week follow-up. She is going for bladder surgery at [facility] next week. Still using Anasept gel wet-to-dry. We will try to get Prisma wet to dry through what  I think is [employer]. This probably will not work 4/19; 4-week follow-up. She is using collagen with wet-to-dry dressings every day. She reports improvement to the wound healing. Patient had bladder neck surgery with suprapubic catheter placement on 3/22. On 3/30 she was sent to the emergency department because of hypotension. She was found to be in septic shock in the setting of ESBL E. coli UTI. Currently she is doing well. 5/17; 4-week follow-up. She is using collagen with backing wet-to-dry. Really nice improvement in surface area of these wounds depth at 1.2 cm. She has had problems with urinary leakage. She does have a suprapubic catheter 6/14; 4-week follow-up. She was doing really well the last time we saw her in the wound had filled in quite a bit. However since that time her wound is gotten a lot deeper. Apparently her bladder surgery failed and she is incontinent a lot of the time. Furthermore her mother suffered a stroke and is staying with her sister. She now has care attendance but I am not really certain about the amount of care she has. We have been using silver collagen but apparently the family has to buy this privately. 6/28; the wound measures slightly larger I certainly do not see the difference. It has 1.5 cm of direct depth but not exposed to bone it does not appear to be infected. We had her using silver alginate although she does not seem to know what her mother's been dressing this with recently 7/12; 2-week follow-up. 1.7 mm of direct depth this is fluctuated a fair amount but I do not see any consistent trend towards closure. The tissue looks healthy minimal undermining no palpable bone. No evidence of surrounding infection. We have been using silver alginate wet-to-dry although the patient wants to go back  to Anasept gel wet to dry 8/30; we have seen this patient in quite a while however her wound has come in nicely at roughly 0.8 or 0.9 mm of direct depth also down in length and width. She is using Anasept wet-to-dry her mother is changing the dressing 9/27; 1 month follow-up. Since she is being here last she apparently has had an attempt to close her urethra by urology in [facility] this was temporarily successful but now is reopened.. We have been using Anasept wet-to-dry. Her variant of Medicaid will not pay for wound care supplies. Most of what the use is being paid for out of their own pocket Objective Constitutional Sitting or standing Blood Pressure is within target range for patient.. Pulse regular and within target range for patient.[name] Respirations regular, non-labored and within target range.. Temperature is normal and within the target range for the patient.[name] Appears in no distress. Vitals Time Taken: 1:06 PM, Height: 65 in, Weight: 201 lbs, BMI: 33.4, Temperature: 98.2 F, Pulse: 88 bpm, Respiratory Rate: 17 breaths/min, Blood Pressure: 126/87 mmHg. General Notes: Wound exam; this is not as good as last time. Closer to underlying bone. Maceration around the wound. Certainly we have not made any progress since the last time she was here. This may have to do with degrees of urinary incontinence. Integumentary (Hair, Skin) Wound #1 status is Open. Original cause of wound was Pressure Injury. The date acquired was: 05/15/2016. The wound has been in treatment 230 weeks. The wound is located on the Medial Sacrum. The wound measures 1.7cm length x 0.9cm width x 1.7cm depth; 1.202cm^2 area and 2.043cm^3 volume. There is Fat Layer (Subcutaneous Tissue) exposed. There is no tunneling noted,  however, there is undermining starting at 3:00 and ending at 8:00 with a maximum distance of 1.5cm. There is a medium amount of serosanguineous drainage noted. The wound margin is well defined and not attached  to the wound base. There is small (1-33%) pink granulation within the wound bed. There is a large (67-100%) amount of necrotic tissue within the wound bed including Adherent Slough. Assessment Active Problems ICD-10 Pressure ulcer of sacral region, stage 4 Paraplegia, incomplete Plan Follow-up Appointments: Return appointment in 1 month. - with Dr. Dellia Nims - ****[facility]**** Bathing/ Shower/ Hygiene: May shower with protection but do not get wound dressing(s) wet. Off-Loading: Turn and reposition every 2 hours WOUND #1: - Sacrum Wound Laterality: Medial Cleanser: Soap and Water 2 x Per Day/30 Days Discharge Instructions: May shower and wash wound with dial antibacterial soap and water prior to dressing change. Cleanser: Wound Cleanser (Generic) 2 x Per Day/30 Days Discharge Instructions: Cleanse the wound with wound cleanser prior to applying a clean dressing using gauze sponges, not tissue or cotton balls. Prim Dressing: Anasept moistened gauze 2 x Per Day/30 Days ary Discharge Instructions: Moisten gauze with anasept and pack gently into wound Secondary Dressing: Woven Gauze Sponge, Non-Sterile 4x4 in (Generic) 2 x Per Day/30 Days Discharge Instructions: Apply over primary dressing as directed. Secondary Dressing: ABD Pad, 5x9 (Generic) 2 x Per Day/30 Days Discharge Instructions: Apply over primary dressing as directed. Secured With: 67M Medipore H Soft Cloth Surgical T ape, 2x2 (in/yd) (Generic) 2 x Per Day/30 Days Discharge Instructions: Secure dressing with tape as directed. Add-Ons: Gloves, Medium 2 x Per Day/30 Days 1. After again some thought there does not seem to be any option other than a basic wet-to-dry dressing and Anasept gel wet to dries as good is any. I would like to try silver collagen to the wound bed backed with moist gauze however we have no payment mechanism and no way to procure this 2. I think she is offloading this is best she can she has had multiple provider  appointments since she was last here. 3. The wound does not look infected there is no exposed bone Electronic Signature(s) Signed: 11/27/2020 4:21:38 PM By: Linton Ham MD Entered By: Linton Ham on 11/27/2020 14:07:38 -------------------------------------------------------------------------------- SuperBill Details Patient Name: Date of Service: Jamie Boyden A. 11/27/2020 Medical Record Number: [id number] Patient Account Number: [id number] Date of Birth/Sex: Treating RN: [birth year] (40 y.o. [name], [name] Primary Care Provider: [name] Other Clinician: Referring Provider: Treating Provider/Extender: Mare Ferrari in Treatment: 230 Diagnosis Coding ICD-10 Codes Code Description L89.154 Pressure ulcer of sacral region, stage 4 G82.22 Paraplegia, incomplete Facility Procedures CPT4 Code: [id number] Description: 35701 - WOUND CARE VISIT-LEV 3 EST PT Modifier: Quantity: 1 Physician Procedures : CPT4 Code Description Modifier [id number] - WC PHYS LEVEL 3 - EST PT ICD-10 Diagnosis Description L89.154 Pressure ulcer of sacral region, stage 4 Quantity: 1 Electronic Signature(s) Signed: 11/29/2020 4:45:58 PM By: Rhae Hammock RN Signed: 11/30/2020 3:46:44 PM By: Linton Ham MD Previous Signature: 11/27/2020 4:21:38 PM Version By: Linton Ham MD Entered By: Rhae Hammock on 11/29/2020 14:54:05

## 2020-11-28 NOTE — Progress Notes (Addendum)
[name], [name] ([id number]) Visit Report for 11/27/2020 Arrival Information Details Patient Name: Date of Service: [name], [name] 11/27/2020 1:15 PM Medical Record Number: [id number] Patient Account Number: [id number] Date of Birth/Sex: Treating RN: 1980-11-09 (40 y.o. [name], [name] Primary Care Jordany Russett: [name] Other Clinician: Referring Atley Neubert: Treating Renatha Rosen/Extender: Mare Ferrari in Treatment: 27 Visit Information History Since Last Visit Added or deleted any medications: No Patient Arrived: Wheel Chair Any new allergies or adverse reactions: No Arrival Time: 13:05 Had a fall or experienced change in No Accompanied By: cna activities of daily living that may affect Transfer Assistance: Harrel Lemon Lift risk of falls: Patient Identification Verified: Yes Signs or symptoms of abuse/neglect since last visito No Secondary Verification Process Completed: Yes Hospitalized since last visit: No Patient Requires Transmission-Based Precautions: No Implantable device outside of the clinic excluding No Patient Has Alerts: No cellular tissue based products placed in the center since last visit: Has Dressing in Place as Prescribed: Yes Pain Present Now: Yes Electronic Signature(s) Signed: 11/28/2020 5:21:18 PM By: Rhae Hammock RN Entered By: Rhae Hammock on 11/27/2020 13:06:09 -------------------------------------------------------------------------------- Clinic Level of Care Assessment Details Patient Name: Date of Service: [name], [name] 11/27/2020 1:15 PM Medical Record Number: [id number] Patient Account Number: [id number] Date of Birth/Sex: Treating RN: 22-Jul-1980 (40 y.o. [name], [name] Primary Care Valton Schwartz: [name] Other Clinician: Referring Ripley Bogosian: Treating Anette Barra/Extender: Mare Ferrari in Treatment: 5 Clinic Level of Care Assessment Items TOOL 4 Quantity Score X- 1  0 Use when only an EandM is performed on FOLLOW-UP visit ASSESSMENTS - Nursing Assessment / Reassessment X- 1 10 Reassessment of Co-morbidities (includes updates in patient status) X- 1 5 Reassessment of Adherence to Treatment Plan ASSESSMENTS - Wound and Skin A ssessment / Reassessment X - Simple Wound Assessment / Reassessment - one wound 1 5 '[]'  - 0 Complex Wound Assessment / Reassessment - multiple wounds '[]'  - 0 Dermatologic / Skin Assessment (not related to wound area) ASSESSMENTS - Focused Assessment '[]'  - 0 Circumferential Edema Measurements - multi extremities '[]'  - 0 Nutritional Assessment / Counseling / Intervention '[]'  - 0 Lower Extremity Assessment (monofilament, tuning fork, pulses) '[]'  - 0 Peripheral Arterial Disease Assessment (using hand held doppler) ASSESSMENTS - Ostomy and/or Continence Assessment and Care '[]'  - 0 Incontinence Assessment and Management '[]'  - 0 Ostomy Care Assessment and Management (repouching, etc.) PROCESS - Coordination of Care X - Simple Patient / Family Education for ongoing care 1 15 '[]'  - 0 Complex (extensive) Patient / Family Education for ongoing care X- 1 10 Staff obtains Programmer, systems, Records, T Results / Process Orders est '[]'  - 0 Staff telephones HHA, Nursing Homes / Clarify orders / etc '[]'  - 0 Routine Transfer to another Facility (non-emergent condition) '[]'  - 0 Routine Hospital Admission (non-emergent condition) '[]'  - 0 New Admissions / Biomedical engineer / Ordering NPWT Apligraf, etc. , '[]'  - 0 Emergency Hospital Admission (emergent condition) X- 1 10 Simple Discharge Coordination '[]'  - 0 Complex (extensive) Discharge Coordination PROCESS - Special Needs '[]'  - 0 Pediatric / Minor Patient Management '[]'  - 0 Isolation Patient Management '[]'  - 0 Hearing / Language / Visual special needs '[]'  - 0 Assessment of Community assistance (transportation, D/C planning, etc.) '[]'  - 0 Additional assistance / Altered mentation '[]'  -  0 Support Surface(s) Assessment (bed, cushion, seat, etc.) INTERVENTIONS - Wound Cleansing / Measurement X - Simple Wound Cleansing - one wound 1 5 '[]'  - 0 Complex Wound Cleansing - multiple  wounds X- 1 5 Wound Imaging (photographs - any number of wounds) '[]'  - 0 Wound Tracing (instead of photographs) X- 1 5 Simple Wound Measurement - one wound '[]'  - 0 Complex Wound Measurement - multiple wounds INTERVENTIONS - Wound Dressings X - Small Wound Dressing one or multiple wounds 1 10 '[]'  - 0 Medium Wound Dressing one or multiple wounds '[]'  - 0 Large Wound Dressing one or multiple wounds X- 1 5 Application of Medications - topical '[]'  - 0 Application of Medications - injection INTERVENTIONS - Miscellaneous '[]'  - 0 External ear exam '[]'  - 0 Specimen Collection (cultures, biopsies, blood, body fluids, etc.) '[]'  - 0 Specimen(s) / Culture(s) sent or taken to Lab for analysis '[]'  - 0 Patient Transfer (multiple staff / Civil Service fast streamer / Similar devices) '[]'  - 0 Simple Staple / Suture removal (25 or less) '[]'  - 0 Complex Staple / Suture removal (26 or more) '[]'  - 0 Hypo / Hyperglycemic Management (close monitor of Blood Glucose) '[]'  - 0 Ankle / Brachial Index (ABI) - do not check if billed separately X- 1 5 Vital Signs Has the patient been seen at the hospital within the last three years: Yes Total Score: 90 Level Of Care: New/Established - Level 3 Electronic Signature(s) Signed: 11/29/2020 4:45:58 PM By: Rhae Hammock RN Entered By: Rhae Hammock on 11/29/2020 14:53:58 -------------------------------------------------------------------------------- Encounter Discharge Information Details Patient Name: Date of Service: Jamie Boyden A. 11/27/2020 1:15 PM Medical Record Number: [id number] Patient Account Number: [id number] Date of Birth/Sex: Treating RN: 1981/01/18 (40 y.o. [name], [name] Primary Care Keyron Pokorski: [name] Other Clinician: Referring Nimo Verastegui: Treating  Genesi Stefanko/Extender: Mare Ferrari in Treatment: 20 Encounter Discharge Information Items Discharge Condition: Stable Ambulatory Status: Wheelchair Discharge Destination: Home Transportation: Private Auto Accompanied By: Drucie Ip Schedule Follow-up Appointment: Yes Clinical Summary of Care: Electronic Signature(s) Signed: 11/28/2020 5:21:18 PM By: Rhae Hammock RN Entered By: Rhae Hammock on 11/27/2020 14:36:19 -------------------------------------------------------------------------------- Lower Extremity Assessment Details Patient Name: Date of Service: [name], Jamie A. 11/27/2020 1:15 PM Medical Record Number: [id number] Patient Account Number: [id number] Date of Birth/Sex: Treating RN: [birth year] (40 y.o. [name], [name] Primary Care Kimoni Pickerill: [name] Other Clinician: Referring Katrina Daddona: Treating Sila Sarsfield/Extender: Mare Ferrari in Treatment: 230 Electronic Signature(s) Signed: 11/28/2020 5:21:18 PM By: Rhae Hammock RN Entered By: Rhae Hammock on 11/27/2020 13:08:54 -------------------------------------------------------------------------------- Multi Wound Chart Details Patient Name: Date of Service: [name], [name] 11/27/2020 1:15 PM Medical Record Number: [id number] Patient Account Number: [id number] Date of Birth/Sex: Treating RN: 27-Dec-1980 (40 y.o. [name], [name] Primary Care Luisana Lutzke: [name] Other Clinician: Referring Dhillon Comunale: Treating Keslyn Teater/Extender: Mare Ferrari in Treatment: 230 Vital Signs Height(in): 65 Pulse(bpm): 88 Weight(lbs): 201 Blood Pressure(mmHg): 126/87 Body Mass Index(BMI): 33 Temperature(F): 98.2 Respiratory Rate(breaths/min): 17 Photos: [N/A:N/A] Medial Sacrum N/A N/A Wound Location: Pressure Injury N/A N/A Wounding Event: Pressure Ulcer N/A N/A Primary Etiology: Anemia, Osteomyelitis N/A N/A Comorbid  History: 05/15/2016 N/A N/A Date Acquired: 230 N/A N/A Weeks of Treatment: Open N/A N/A Wound Status: 1.7x0.9x1.7 N/A N/A Measurements L x W x D (cm) 1.202 N/A N/A A (cm) : rea 2.043 N/A N/A Volume (cm) : 96.30% N/A N/A % Reduction in A rea: 98.60% N/A N/A % Reduction in Volume: 3 Starting Position 1 (o'clock): 8 Ending Position 1 (o'clock): 1.5 Maximum Distance 1 (cm): Yes N/A N/A Undermining: Category/Stage IV N/A N/A Classification: Medium N/A N/A Exudate A mount: Serosanguineous N/A N/A Exudate Type: red, brown N/A N/A Exudate Color: Well  defined, not attached N/A N/A Wound Margin: Small (1-33%) N/A N/A Granulation A mount: Pink N/A N/A Granulation Quality: Large (67-100%) N/A N/A Necrotic A mount: Fat Layer (Subcutaneous Tissue): Yes N/A N/A Exposed Structures: Fascia: No Tendon: No Muscle: No Joint: No Bone: No Small (1-33%) N/A N/A Epithelialization: Treatment Notes Electronic Signature(s) Signed: 11/27/2020 4:21:38 PM By: Linton Ham MD Signed: 11/28/2020 5:21:18 PM By: Rhae Hammock RN Entered By: Linton Ham on 11/27/2020 14:04:11 -------------------------------------------------------------------------------- Multi-Disciplinary Care Plan Details Patient Name: Date of Service: [name], Jamie A. 11/27/2020 1:15 PM Medical Record Number: [id number] Patient Account Number: [id number] Date of Birth/Sex: Treating RN: 1980-06-09 (40 y.o. [name], [name] Primary Care Roslynn Holte: [name] Other Clinician: Referring Sherrina Zaugg: Treating Darryl Willner/Extender: Mare Ferrari in Treatment: [place] reviewed with physician Active Inactive Wound/Skin Impairment Nursing Diagnoses: Impaired tissue integrity Knowledge deficit related to smoking impact on wound healing Knowledge deficit related to ulceration/compromised skin integrity Goals: Patient will demonstrate a reduced rate of  smoking or cessation of smoking Date Initiated: 06/27/2016 Date Inactivated: 01/26/2017 Target Resolution Date: 01/08/2017 Goal Status: Unmet Unmet Reason: pt continues to smoke Patient/caregiver will verbalize understanding of skin care regimen Date Initiated: 06/27/2016 Target Resolution Date: 12/04/2020 Goal Status: Active Ulcer/skin breakdown will have a volume reduction of 50% by week 8 Date Initiated: 06/27/2016 Date Inactivated: 12/11/2016 Target Resolution Date: 07/25/2016 Goal Status: Met Interventions: Assess patient/caregiver ability to obtain necessary supplies Assess patient/caregiver ability to perform ulcer/skin care regimen upon admission and as needed Assess ulceration(s) every visit Provide education on smoking Provide education on ulcer and skin care Treatment Activities: Skin care regimen initiated : 06/27/2016 Topical wound management initiated : 06/27/2016 Notes: Electronic Signature(s) Signed: 11/28/2020 5:21:18 PM By: Rhae Hammock RN Entered By: Rhae Hammock on 11/27/2020 13:51:49 -------------------------------------------------------------------------------- Pain Assessment Details Patient Name: Date of Service: [name], Jamie A. 11/27/2020 1:15 PM Medical Record Number: [id number] Patient Account Number: [id number] Date of Birth/Sex: Treating RN: 1980-12-25 (40 y.o. [name], [name] Primary Care Keiden Deskin: [name] Other Clinician: Referring Eusebio Blazejewski: Treating Darryl Blumenstein/Extender: Mare Ferrari in Treatment: 230 Active Problems Location of Pain Severity and Description of Pain Patient Has Paino Yes Site Locations Pain Location: Pain Location: Pain in Ulcers With Dressing Change: Yes Duration of the Pain. Constant / Intermittento Intermittent Rate the pain. Current Pain Level: 6 Worst Pain Level: 10 Least Pain Level: 0 Tolerable Pain Level: 6 Character of Pain Describe the Pain: Aching Pain  Management and Medication Current Pain Management: Medication: Yes Cold Application: No Rest: Yes Massage: No Activity: No T.E.N.S.: No Heat Application: No Leg drop or elevation: No Is the Current Pain Management Adequate: Adequate How does your wound impact your activities of daily livingo Sleep: No Bathing: No Appetite: No Relationship With Others: No Bladder Continence: No Emotions: No Bowel Continence: No Work: No Toileting: No Drive: No Dressing: No Hobbies: No Electronic Signature(s) Signed: 11/28/2020 5:21:18 PM By: Rhae Hammock RN Entered By: Rhae Hammock on 11/27/2020 13:08:06 -------------------------------------------------------------------------------- Patient/Caregiver Education Details Patient Name: Date of Service: Karlton Lemon 9/27/2022andnbsp1:15 PM Medical Record Number: [id number] Patient Account Number: [id number] Date of Birth/Gender: Treating RN: [birth year] (40 y.o. Benjaman Lobe Primary Care Physician: [name] Other Clinician: Referring Physician: Treating Physician/Extender: Mare Ferrari in Treatment: 60 Education Assessment Education Provided To: Patient Education Topics Provided Wound/Skin Impairment: Methods: Explain/Verbal Responses: State content correctly Electronic Signature(s) Signed: 11/28/2020 5:21:18 PM By: Rhae Hammock RN Entered By: Rhae Hammock on 11/27/2020 13:52:08 --------------------------------------------------------------------------------  Wound Assessment Details Patient Name: Date of Service: [name], [name] 11/27/2020 1:15 PM Medical Record Number: [id number] Patient Account Number: [id number] Date of Birth/Sex: Treating RN: 12-25-1980 (40 y.o. [name], [name] Primary Care Valina Maes: [name] Other Clinician: Referring Alexes Lamarque: Treating Kerie Badger/Extender: Mare Ferrari in Treatment: 230 Wound  Status Wound Number: 1 Primary Etiology: Pressure Ulcer Wound Location: Medial Sacrum Wound Status: Open Wounding Event: Pressure Injury Comorbid History: Anemia, Osteomyelitis Date Acquired: 05/15/2016 Weeks Of Treatment: 230 Clustered Wound: No Photos Wound Measurements Length: (cm) 1.7 Width: (cm) 0.9 Depth: (cm) 1.7 Area: (cm) 1.202 Volume: (cm) 2.043 % Reduction in Area: 96.3% % Reduction in Volume: 98.6% Epithelialization: Small (1-33%) Tunneling: No Undermining: Yes Starting Position (o'clock): 3 Ending Position (o'clock): 8 Maximum Distance: (cm) 1.5 Wound Description Classification: Category/Stage IV Wound Margin: Well defined, not attached Exudate Amount: Medium Exudate Type: Serosanguineous Exudate Color: red, brown Foul Odor After Cleansing: No Slough/Fibrino Yes Wound Bed Granulation Amount: Small (1-33%) Exposed Structure Granulation Quality: Pink Fascia Exposed: No Necrotic Amount: Large (67-100%) Fat Layer (Subcutaneous Tissue) Exposed: Yes Necrotic Quality: Adherent Slough Tendon Exposed: No Muscle Exposed: No Joint Exposed: No Bone Exposed: No Treatment Notes Wound #1 (Sacrum) Wound Laterality: Medial Cleanser Soap and Water Discharge Instruction: May shower and wash wound with dial antibacterial soap and water prior to dressing change. Wound Cleanser Discharge Instruction: Cleanse the wound with wound cleanser prior to applying a clean dressing using gauze sponges, not tissue or cotton balls. Peri-Wound Care Topical Primary Dressing Anasept moistened gauze Discharge Instruction: Moisten gauze with anasept and pack gently into wound Secondary Dressing Woven Gauze Sponge, Non-Sterile 4x4 in Discharge Instruction: Apply over primary dressing as directed. ABD Pad, 5x9 Discharge Instruction: Apply over primary dressing as directed. Secured With 55M [place] [facility] T ape, 2x2 (in/yd) Discharge Instruction: Secure dressing with  tape as directed. Compression Wrap Compression Stockings Add-Ons Gloves, Medium Electronic Signature(s) Signed: 11/28/2020 5:21:18 PM By: Rhae Hammock RN Entered By: Rhae Hammock on 11/27/2020 13:53:53 -------------------------------------------------------------------------------- Vitals Details Patient Name: Date of Service: Jamie Boyden A. 11/27/2020 1:15 PM Medical Record Number: [id number] Patient Account Number: [id number] Date of Birth/Sex: Treating RN: 02-Jul-1980 (40 y.o. [name], [name] Primary Care Theodis Kinsel: [name] Other Clinician: Referring Gurjot Brisco: Treating Harmonii Karle/Extender: Mare Ferrari in Treatment: 230 Vital Signs Time Taken: 13:06 Temperature (F): 98.2 Height (in): 65 Pulse (bpm): 88 Weight (lbs): 201 Respiratory Rate (breaths/min): 17 Body Mass Index (BMI): 33.4 Blood Pressure (mmHg): 126/87 Reference Range: 80 - 120 mg / dl Electronic Signature(s) Signed: 11/28/2020 5:21:18 PM By: Rhae Hammock RN Entered By: Rhae Hammock on 11/27/2020 13:07:02

## 2020-12-03 ENCOUNTER — Ambulatory Visit (HOSPITAL_COMMUNITY): Payer: Medicaid Other

## 2020-12-04 ENCOUNTER — Other Ambulatory Visit: Payer: Self-pay | Admitting: Family Medicine

## 2020-12-04 DIAGNOSIS — M62838 Other muscle spasm: Secondary | ICD-10-CM

## 2020-12-07 ENCOUNTER — Other Ambulatory Visit: Payer: Self-pay | Admitting: Family Medicine

## 2020-12-07 DIAGNOSIS — F411 Generalized anxiety disorder: Secondary | ICD-10-CM

## 2020-12-07 DIAGNOSIS — F339 Major depressive disorder, recurrent, unspecified: Secondary | ICD-10-CM

## 2020-12-07 DIAGNOSIS — B372 Candidiasis of skin and nail: Secondary | ICD-10-CM

## 2020-12-09 ENCOUNTER — Other Ambulatory Visit: Payer: Self-pay | Admitting: Family Medicine

## 2020-12-11 ENCOUNTER — Other Ambulatory Visit: Payer: Self-pay | Admitting: Urology

## 2020-12-14 ENCOUNTER — Other Ambulatory Visit: Payer: Self-pay

## 2020-12-14 ENCOUNTER — Ambulatory Visit (HOSPITAL_COMMUNITY)
Admission: RE | Admit: 2020-12-14 | Discharge: 2020-12-14 | Disposition: A | Discharge status: Home / Self Care | Payer: Medicaid Other | Source: Ambulatory Visit | Attending: Student | Admitting: Student

## 2020-12-14 ENCOUNTER — Ambulatory Visit (HOSPITAL_COMMUNITY): Admission: RE | Admit: 2020-12-14 | Payer: Medicaid Other | Source: Ambulatory Visit

## 2020-12-14 DIAGNOSIS — M5412 Radiculopathy, cervical region: Secondary | ICD-10-CM | POA: Insufficient documentation

## 2020-12-17 ENCOUNTER — Encounter: Payer: Self-pay | Admitting: Family Medicine

## 2020-12-17 ENCOUNTER — Telehealth: Payer: Self-pay | Admitting: Family Medicine

## 2020-12-17 ENCOUNTER — Ambulatory Visit (INDEPENDENT_AMBULATORY_CARE_PROVIDER_SITE_OTHER): Payer: Medicaid Other | Admitting: Family Medicine

## 2020-12-17 DIAGNOSIS — R3989 Other symptoms and signs involving the genitourinary system: Secondary | ICD-10-CM

## 2020-12-17 DIAGNOSIS — B3731 Acute candidiasis of vulva and vagina: Secondary | ICD-10-CM

## 2020-12-17 MED ORDER — SULFAMETHOXAZOLE-TRIMETHOPRIM 800-160 MG PO TABS: 1.0000 | ORAL_TABLET | Freq: Two times a day (BID) | ORAL | 0 refills | Status: AC

## 2020-12-17 MED ORDER — CYANOCOBALAMIN 1000 MCG PO TABS: 1000.0000 ug | ORAL_TABLET | Freq: Every day | ORAL | 1 refills | Status: DC

## 2020-12-17 MED ORDER — FLUCONAZOLE 150 MG PO TABS: 150.0000 mg | ORAL_TABLET | Freq: Once | ORAL | 0 refills | Status: AC

## 2020-12-17 NOTE — Telephone Encounter (Signed)
[name] wants to talk to someone about medicine and uti. She has been confused and tired. Wants to know what [facility] [employer] would want her to do. Please call back and advise.

## 2020-12-17 NOTE — Telephone Encounter (Signed)
Returned call - appointment scheduled
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
## 2020-12-19 ENCOUNTER — Other Ambulatory Visit: Payer: Self-pay

## 2020-12-19 ENCOUNTER — Telehealth: Payer: Self-pay

## 2020-12-19 ENCOUNTER — Other Ambulatory Visit: Payer: Self-pay | Admitting: Family Medicine

## 2020-12-19 DIAGNOSIS — N3021 Other chronic cystitis with hematuria: Secondary | ICD-10-CM

## 2020-12-19 MED ORDER — SULFAMETHOXAZOLE-TRIMETHOPRIM 400-80 MG PO TABS: 1.0000 | ORAL_TABLET | Freq: Every day | ORAL | 11 refills | Status: DC

## 2020-12-19 NOTE — Telephone Encounter (Signed)
The rx failed to send to pharmacy

## 2020-12-19 NOTE — Telephone Encounter (Signed)
Refill submitted. 

## 2020-12-25 ENCOUNTER — Encounter (HOSPITAL_BASED_OUTPATIENT_CLINIC_OR_DEPARTMENT_OTHER): Payer: Medicaid Other | Attending: Internal Medicine | Admitting: Internal Medicine

## 2020-12-25 ENCOUNTER — Other Ambulatory Visit: Payer: Self-pay

## 2020-12-25 DIAGNOSIS — Z993 Dependence on wheelchair: Secondary | ICD-10-CM | POA: Insufficient documentation

## 2020-12-25 DIAGNOSIS — L89309 Pressure ulcer of unspecified buttock, unspecified stage: Secondary | ICD-10-CM | POA: Diagnosis not present

## 2020-12-25 DIAGNOSIS — G8220 Paraplegia, unspecified: Secondary | ICD-10-CM | POA: Insufficient documentation

## 2021-01-03 ENCOUNTER — Telehealth: Payer: Self-pay | Admitting: Orthopedic Surgery

## 2021-01-03 NOTE — Telephone Encounter (Signed)
Patient last seen 09-26-20 by Dr. Marlou Sa. She states she is to have knee surgery and was to call when ready. Please advise if patient needs to be seen prior to scheduling.  Please provide surgery sheet if surgery is in order.  Pt's cb  [phone]

## 2021-01-21 NOTE — Telephone Encounter (Signed)
Done thx

## 2021-01-23 ENCOUNTER — Ambulatory Visit (INDEPENDENT_AMBULATORY_CARE_PROVIDER_SITE_OTHER): Payer: Medicaid Other | Admitting: Nurse Practitioner

## 2021-01-23 ENCOUNTER — Other Ambulatory Visit: Payer: Self-pay

## 2021-01-23 ENCOUNTER — Encounter: Payer: Self-pay | Admitting: Nurse Practitioner

## 2021-01-23 VITALS — HR 80 | Temp 98.0°F | Resp 20

## 2021-01-23 DIAGNOSIS — G8929 Other chronic pain: Secondary | ICD-10-CM

## 2021-01-23 DIAGNOSIS — N898 Other specified noninflammatory disorders of vagina: Secondary | ICD-10-CM | POA: Diagnosis not present

## 2021-01-23 DIAGNOSIS — H9203 Otalgia, bilateral: Secondary | ICD-10-CM

## 2021-01-23 MED ORDER — AMOXICILLIN 875 MG PO TABS: 875.0000 mg | ORAL_TABLET | Freq: Two times a day (BID) | ORAL | 0 refills | Status: DC

## 2021-01-23 MED ORDER — METHYLPREDNISOLONE ACETATE 80 MG/ML IJ SUSP
80.0000 mg | Freq: Once | INTRAMUSCULAR | Status: AC
  Administered 2021-01-23: 80 mg via INTRAMUSCULAR

## 2021-01-23 MED ORDER — FLUCONAZOLE 150 MG PO TABS: 150.0000 mg | ORAL_TABLET | Freq: Once | ORAL | 0 refills | Status: AC
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
## 2021-01-23 NOTE — Assessment & Plan Note (Signed)
Vaginal discharge in the past few days.  Itching no burning or symptoms of dysuria associated.  Fluconazole 150 mg tablet by mouth once.  Follow-up with worsening unresolved symptoms.

## 2021-01-23 NOTE — Assessment & Plan Note (Signed)
Uncontrolled back pain worsening in the past few days.  Continue hydrocodone 10 mg tablet by mouth.  Depo-Medrol 80 shot given in clinic.  Rest back and follow-up if worsening unresolved symptoms.

## 2021-01-23 NOTE — Assessment & Plan Note (Signed)
Unresolved severe pain in the past 1 to 2 weeks.  No fever, nausea, or sinus symptoms associated.  Assessment bilateral ear presents as a otitis media.  Amoxicillin 875 mg tablet by mouth twice daily for 5 days.  Tylenol for headache, follow-up for worsening or unresolved symptoms.  Rx sent to pharmacy.  Education provided to patient printed handouts given.

## 2021-01-23 NOTE — Patient Instructions (Signed)
Chronic Back Pain When back pain lasts longer than 3 months, it is called chronic back pain. Pain may get worse at certain times (flare-ups). There are things you can do at home to manage your pain. Follow these instructions at home: Pay attention to any changes in your symptoms. Take these actions to help with your pain: Managing pain and stiffness   If told, put ice on the painful area. Your doctor may tell you to use ice for 24-48 hours after the flare-up starts. To do this: Put ice in a plastic bag. Place a towel between your skin and the bag. Leave the ice on for 20 minutes, 2-3 times a day. If told, put heat on the painful area. Do this as often as told by your doctor. Use the heat source that your doctor recommends, such as a moist heat pack or a heating pad. Place a towel between your skin and the heat source. Leave the heat on for 20-30 minutes. Take off the heat if your skin turns bright red. This is especially important if you are unable to feel pain, heat, or cold. You may have a greater risk of getting burned. Soak in a warm bath. This can help relieve pain. Activity  Avoid bending and other activities that make pain worse. When standing: Keep your upper back and neck straight. Keep your shoulders pulled back. Avoid slouching. When sitting: Keep your back straight. Relax your shoulders. Do not round your shoulders or pull them backward. Do not sit or stand in one place for long periods of time. Take short rest breaks during the day. Lying down or standing is usually better than sitting. Resting can help relieve pain. When sitting or lying down for a long time, do some mild activity or stretching. This will help to prevent stiffness and pain. Get regular exercise. Ask your doctor what activities are safe for you. Do not lift anything that is heavier than 10 lb (4.5 kg) or the limit that you are told, until your doctor says that it is safe. To prevent injury when you lift  things: Bend your knees. Keep the weight close to your body. Avoid twisting. Sleep on a firm mattress. Try lying on your side with your knees slightly bent. If you lie on your back, put a pillow under your knees. Medicines Treatment may include medicines for pain and swelling taken by mouth or put on the skin, prescription pain medicine, or muscle relaxants. Take over-the-counter and prescription medicines only as told by your doctor. Ask your doctor if the medicine prescribed to you: Requires you to avoid driving or using machinery. Can cause trouble pooping (constipation). You may need to take these actions to prevent or treat trouble pooping: Drink enough fluid to keep your pee (urine) pale yellow. Take over-the-counter or prescription medicines. Eat foods that are high in fiber. These include beans, whole grains, and fresh fruits and vegetables. Limit foods that are high in fat and sugars. These include fried or sweet foods. General instructions Do not use any products that contain nicotine or tobacco, such as cigarettes, e-cigarettes, and chewing tobacco. If you need help quitting, ask your doctor. Keep all follow-up visits as told by your doctor. This is important. Contact a doctor if: Your pain does not get better with rest or medicine. Your pain gets worse, or you have new pain. You have a high fever. You lose weight very quickly. You have trouble doing your normal activities. Get help right away if: One   or both of your legs or feet feel weak. One or both of your legs or feet lose feeling (have numbness). You have trouble controlling when you poop (have a bowel movement) or pee (urinate). You have bad back pain and: You feel like you may vomit (nauseous), or you vomit. You have pain in your belly (abdomen). You have shortness of breath. You faint. Summary When back pain lasts longer than 3 months, it is called chronic back pain. Pain may get worse at certain times  (flare-ups). Use ice and heat as told by your doctor. Your doctor may tell you to use ice after flare-ups. This information is not intended to replace advice given to you by your health care provider. Make sure you discuss any questions you have with your health care provider. Document Revised: 03/30/2019 Document Reviewed: 03/30/2019 Elsevier Patient Education  [place], Adult An earache, or ear pain, can be caused by many things, including: An infection. Ear wax buildup. Ear pressure. Something in the ear that should not be there (foreign body). A sore throat. Tooth problems. Jaw problems. Treatment of the earache will depend on the cause. If the cause is not clear or cannot be determined, you may need to watch your symptoms until your earache goes away or until a cause is found. Follow these instructions at home: Medicines Take or apply over-the-counter and prescription medicines only as told by your health care provider. If you were prescribed an antibiotic medicine, use it as told by your health care provider. Do not stop using the antibiotic even if you start to feel better. Do not put anything in your ear other than medicine that is prescribed by your health care provider. Managing pain If directed, apply heat to the affected area as often as told by your health care provider. Use the heat source that your health care provider recommends, such as a moist heat pack or a heating pad. Place a towel between your skin and the heat source. Leave the heat on for 20-30 minutes. Remove the heat if your skin turns bright red. This is especially important if you are unable to feel pain, heat, or cold. You may have a greater risk of getting burned. If directed, put ice on the affected area as often as told by your health care provider. To do this:   Put ice in a plastic bag. Place a towel between your skin and the bag. Leave the ice on for 20 minutes, 2-3 times a  day. General instructions Pay attention to any changes in your symptoms. Try resting in an upright position instead of lying down. This may help to reduce pressure in your ear and relieve pain. Chew gum if it helps to relieve your ear pain. Treat any allergies as told by your health care provider. Drink enough fluid to keep your urine pale yellow. It is up to you to get the results of any tests that were done. Ask your health care provider, or the department that is doing the tests, when your results will be ready. Keep all follow-up visits as told by your health care provider. This is important. Contact a health care provider if: Your pain does not improve within 2 days. Your earache gets worse. You have new symptoms. You have a fever. Get help right away if you: Have a severe headache. Have a stiff neck. Have trouble swallowing. Have redness or swelling behind your ear. Have fluid or blood coming from your ear. Have hearing  loss. Feel dizzy. Summary An earache, or ear pain, can be caused by many things. Treatment of the earache will depend on the cause. Follow recommendations from your health care provider to treat your ear pain. If the cause is not clear or cannot be determined, you may need to watch your symptoms until your earache goes away or until a cause is found. Keep all follow-up visits as told by your health care provider. This is important. This information is not intended to replace advice given to you by your health care provider. Make sure you discuss any questions you have with your health care provider. Document Revised: 09/25/2018 Document Reviewed: 09/25/2018 Elsevier Patient Education  [place].

## 2021-01-28 ENCOUNTER — Other Ambulatory Visit: Payer: Self-pay | Admitting: Nurse Practitioner

## 2021-01-28 ENCOUNTER — Other Ambulatory Visit: Payer: Self-pay | Admitting: Family Medicine

## 2021-01-28 ENCOUNTER — Telehealth: Payer: Self-pay | Admitting: Family Medicine

## 2021-01-28 DIAGNOSIS — B372 Candidiasis of skin and nail: Secondary | ICD-10-CM

## 2021-01-28 MED ORDER — AMOXICILLIN-POT CLAVULANATE 875-125 MG PO TABS: 1.0000 | ORAL_TABLET | Freq: Two times a day (BID) | ORAL | 0 refills | Status: DC

## 2021-01-28 NOTE — Progress Notes (Signed)
I sent 2 more days of antibiotics to make up to 7 days total. Have patient follow up with unresolved symptoms.

## 2021-01-28 NOTE — Telephone Encounter (Signed)
Pt aware.

## 2021-01-29 ENCOUNTER — Other Ambulatory Visit: Payer: Self-pay

## 2021-01-29 ENCOUNTER — Encounter (HOSPITAL_BASED_OUTPATIENT_CLINIC_OR_DEPARTMENT_OTHER): Payer: Medicaid Other | Attending: Internal Medicine | Admitting: Internal Medicine

## 2021-01-29 DIAGNOSIS — G8222 Paraplegia, incomplete: Secondary | ICD-10-CM | POA: Insufficient documentation

## 2021-01-29 DIAGNOSIS — L89154 Pressure ulcer of sacral region, stage 4: Secondary | ICD-10-CM | POA: Insufficient documentation

## 2021-01-29 NOTE — Progress Notes (Signed)
[name], [name] ([id number]) Visit Report for 01/29/2021 Arrival Information Details Patient Name: Date of Service: [name], [name] 01/29/2021 1:15 PM Medical Record Number: [id number] Patient Account Number: [id number] Date of Birth/Sex: Treating RN: 06/07/1980 (40 y.o. [name], [name] Primary Care Aften Lipsey: [name] Other Clinician: Referring Achsah Mcquade: Treating Knut Rondinelli/Extender: Mare Ferrari in Treatment: 78 Visit Information History Since Last Visit Added or deleted any medications: No Patient Arrived: Wheel Chair Any new allergies or adverse reactions: No Arrival Time: 13:50 Had a fall or experienced change in No Accompanied By: mom activities of daily living that may affect Transfer Assistance: Harrel Lemon Lift risk of falls: Patient Identification Verified: Yes Signs or symptoms of abuse/neglect since last visito No Secondary Verification Process Completed: Yes Hospitalized since last visit: No Patient Requires Transmission-Based Precautions: No Implantable device outside of the clinic excluding No Patient Has Alerts: No cellular tissue based products placed in the center since last visit: Has Dressing in Place as Prescribed: Yes Pain Present Now: No Electronic Signature(s) Signed: 01/29/2021 5:11:56 PM By: Rhae Hammock RN Entered By: Rhae Hammock on 01/29/2021 13:50:47 -------------------------------------------------------------------------------- Encounter Discharge Information Details Patient Name: Date of Service: Jamie Boyden A. 01/29/2021 1:15 PM Medical Record Number: [id number] Patient Account Number: [id number] Date of Birth/Sex: Treating RN: 05-Dec-1980 (40 y.o. [name], [name] Primary Care Aneisa Karren: [name] Other Clinician: Referring Kaiven Vester: Treating Sami Roes/Extender: Mare Ferrari in Treatment: 239 Encounter Discharge Information Items Post Procedure  Vitals Discharge Condition: Stable Temperature (F): 97.4 Ambulatory Status: Wheelchair Pulse (bpm): 74 Discharge Destination: Home Respiratory Rate (breaths/min): 17 Transportation: Private Auto Blood Pressure (mmHg): 97/59 Accompanied By: mom Schedule Follow-up Appointment: Yes Clinical Summary of Care: Patient Declined Electronic Signature(s) Signed: 01/29/2021 5:11:56 PM By: Rhae Hammock RN Entered By: Rhae Hammock on 01/29/2021 14:44:33 -------------------------------------------------------------------------------- Lower Extremity Assessment Details Patient Name: Date of Service: [name], Jamie A. 01/29/2021 1:15 PM Medical Record Number: [id number] Patient Account Number: [id number] Date of Birth/Sex: Treating RN: Mar 19, 1980 (40 y.o. [name], [name] Primary Care Reene Harlacher: [name] Other Clinician: Referring Zinnia Tindall: Treating Ellakate Gonsalves/Extender: Mare Ferrari in Treatment: 239 Electronic Signature(s) Signed: 01/29/2021 5:11:56 PM By: Rhae Hammock RN Entered By: Rhae Hammock on 01/29/2021 14:07:26 -------------------------------------------------------------------------------- Multi Wound Chart Details Patient Name: Date of Service: [name], [name] 01/29/2021 1:15 PM Medical Record Number: [id number] Patient Account Number: [id number] Date of Birth/Sex: Treating RN: Mar 10, 1980 (40 y.o. F) Primary Care Wanisha Shiroma: [name] Other Clinician: Referring Tedd Cottrill: Treating Derik Fults/Extender: Mare Ferrari in Treatment: 239 Vital Signs Height(in): 65 Pulse(bpm): 74 Weight(lbs): 201 Blood Pressure(mmHg): 96/59 Body Mass Index(BMI): 33 Temperature(F): 98.7 Respiratory Rate(breaths/min): 17 Photos: [N/A:N/A] Medial Sacrum N/A N/A Wound Location: Pressure Injury N/A N/A Wounding Event: Pressure Ulcer N/A N/A Primary Etiology: Anemia, Osteomyelitis N/A N/A Comorbid  History: 05/15/2016 N/A N/A Date Acquired: 239 N/A N/A Weeks of Treatment: Open N/A N/A Wound Status: 2x1x2.7 N/A N/A Measurements L x W x D (cm) 1.571 N/A N/A A (cm) : rea 4.241 N/A N/A Volume (cm) : 95.20% N/A N/A % Reduction in A rea: 97.10% N/A N/A % Reduction in Volume: 4 Starting Position 1 (o'clock): 6 Ending Position 1 (o'clock): 2.5 Maximum Distance 1 (cm): Yes N/A N/A Undermining: Category/Stage IV N/A N/A Classification: Medium N/A N/A Exudate A mount: Serosanguineous N/A N/A Exudate Type: red, brown N/A N/A Exudate Color: Well defined, not attached N/A N/A Wound Margin: Medium (34-66%) N/A N/A Granulation A mount: Pink N/A N/A Granulation Quality: Medium (34-66%) N/A  N/A Necrotic Amount: Fat Layer (Subcutaneous Tissue): Yes N/A N/A Exposed Structures: Fascia: No Tendon: No Muscle: No Joint: No Bone: No Small (1-33%) N/A N/A Epithelialization: Debridement - Excisional N/A N/A Debridement: Pre-procedure Verification/Time Out 14:35 N/A N/A Taken: Lidocaine N/A N/A Pain Control: Subcutaneous, Slough N/A N/A Tissue Debrided: Skin/Subcutaneous Tissue N/A N/A Level: 2 N/A N/A Debridement A (sq cm): rea Curette N/A N/A Instrument: Minimum N/A N/A Bleeding: Silver Nitrate N/A N/A Hemostasis A chieved: 0 N/A N/A Procedural Pain: 0 N/A N/A Post Procedural Pain: Procedure was tolerated well N/A N/A Debridement Treatment Response: 2x1x2.7 N/A N/A Post Debridement Measurements L x W x D (cm) 4.241 N/A N/A Post Debridement Volume: (cm) Category/Stage IV N/A N/A Post Debridement Stage: Debridement N/A N/A Procedures Performed: Treatment Notes Wound #1 (Sacrum) Wound Laterality: Medial Cleanser Soap and Water Discharge Instruction: May shower and wash wound with dial antibacterial soap and water prior to dressing change. Wound Cleanser Discharge Instruction: Cleanse the wound with wound cleanser prior to applying a clean dressing  using gauze sponges, not tissue or cotton balls. Peri-Wound Care Topical Primary Dressing KerraCel Ag Gelling Fiber Dressing, 4x5 in (silver alginate) Discharge Instruction: Apply silver alginate to wound bed as instructed Secondary Dressing Woven Gauze Sponge, Non-Sterile 4x4 in Discharge Instruction: Apply over primary dressing as directed. ABD Pad, 5x9 Discharge Instruction: Apply over primary dressing as directed. Secured With 37M [place] Surgical T ape, 2x2 (in/yd) Discharge Instruction: Secure dressing with tape as directed. Compression Wrap Compression Stockings Add-Ons Gloves, Medium Electronic Signature(s) Signed: 01/29/2021 5:06:47 PM By: Linton Ham MD Entered By: Linton Ham on 01/29/2021 14:48:39 -------------------------------------------------------------------------------- Multi-Disciplinary Care Plan Details Patient Name: Date of Service: [name], Jamie A. 01/29/2021 1:15 PM Medical Record Number: [id number] Patient Account Number: [id number] Date of Birth/Sex: Treating RN: [birth year] (40 y.o. [name], [name] Primary Care Virna Livengood: [name] Other Clinician: Referring Evalina Tabak: Treating Yulian Gosney/Extender: Mare Ferrari in Treatment: [place] reviewed with physician Active Inactive Wound/Skin Impairment Nursing Diagnoses: Impaired tissue integrity Knowledge deficit related to smoking impact on wound healing Knowledge deficit related to ulceration/compromised skin integrity Goals: Patient will demonstrate a reduced rate of smoking or cessation of smoking Date Initiated: 06/27/2016 Date Inactivated: 01/26/2017 Target Resolution Date: 01/08/2017 Goal Status: Unmet Unmet Reason: pt continues to smoke Patient/caregiver will verbalize understanding of skin care regimen Date Initiated: 06/27/2016 Target Resolution Date: 02/02/2021 Goal Status: Active Ulcer/skin breakdown will have  a volume reduction of 50% by week 8 Date Initiated: 06/27/2016 Date Inactivated: 12/11/2016 Target Resolution Date: 07/25/2016 Goal Status: Met Interventions: Assess patient/caregiver ability to obtain necessary supplies Assess patient/caregiver ability to perform ulcer/skin care regimen upon admission and as needed Assess ulceration(s) every visit Provide education on smoking Provide education on ulcer and skin care Treatment Activities: Skin care regimen initiated : 06/27/2016 Topical wound management initiated : 06/27/2016 Notes: Electronic Signature(s) Signed: 01/29/2021 5:11:56 PM By: Rhae Hammock RN Entered By: Rhae Hammock on 01/29/2021 14:25:16 -------------------------------------------------------------------------------- Pain Assessment Details Patient Name: Date of Service: [name], Jamie A. 01/29/2021 1:15 PM Medical Record Number: [id number] Patient Account Number: [id number] Date of Birth/Sex: Treating RN: 1980-03-06 (40 y.o. [name], [name] Primary Care Rapheal Masso: [name] Other Clinician: Referring [place] Ke: Treating Zala Degrasse/Extender: Mare Ferrari in Treatment: 239 Active Problems Location of Pain Severity and Description of Pain Patient Has Paino No Site Locations Pain Management and Medication Current Pain Management: Electronic Signature(s) Signed: 01/29/2021 5:11:56 PM By: Rhae Hammock RN Entered By: Rhae Hammock  on 01/29/2021 14:07:20 -------------------------------------------------------------------------------- Patient/Caregiver Education Details Patient Name: Date of Service: [name], [name] 11/29/2022andnbsp1:15 PM Medical Record Number: [id number] Patient Account Number: [id number] Date of Birth/Gender: Treating RN: 10-18-1980 (40 y.o. [name], [name] Primary Care Physician: [name] Other Clinician: Referring Physician: Treating Physician/Extender: Mare Ferrari in Treatment: 48 Education Assessment Education Provided To: Patient Education Topics Provided Smoking and Wound Healing: Methods: Explain/Verbal Responses: State content correctly Electronic Signature(s) Signed: 01/29/2021 5:11:56 PM By: Rhae Hammock RN Entered By: Rhae Hammock on 01/29/2021 14:25:36 -------------------------------------------------------------------------------- Wound Assessment Details Patient Name: Date of Service: [name], Jamie A. 01/29/2021 1:15 PM Medical Record Number: [id number] Patient Account Number: [id number] Date of Birth/Sex: Treating RN: 03-22-1980 (40 y.o. [name], [name] Primary Care Tranesha Lessner: [name] Other Clinician: Referring Millan Legan: Treating Jerlean Peralta/Extender: Mare Ferrari in Treatment: 239 Wound Status Wound Number: 1 Primary Etiology: Pressure Ulcer Wound Location: Medial Sacrum Wound Status: Open Wounding Event: Pressure Injury Comorbid History: Anemia, Osteomyelitis Date Acquired: 05/15/2016 Weeks Of Treatment: 239 Clustered Wound: No Photos Wound Measurements Length: (cm) 2 Width: (cm) 1 Depth: (cm) 2.7 Area: (cm) 1.571 Volume: (cm) 4.241 % Reduction in Area: 95.2% % Reduction in Volume: 97.1% Epithelialization: Small (1-33%) Tunneling: No Undermining: Yes Starting Position (o'clock): 4 Ending Position (o'clock): 6 Maximum Distance: (cm) 2.5 Wound Description Classification: Category/Stage IV Wound Margin: Well defined, not attached Exudate Amount: Medium Exudate Type: Serosanguineous Exudate Color: red, brown Foul Odor After Cleansing: No Slough/Fibrino Yes Wound Bed Granulation Amount: Medium (34-66%) Exposed Structure Granulation Quality: Pink Fascia Exposed: No Necrotic Amount: Medium (34-66%) Fat Layer (Subcutaneous Tissue) Exposed: Yes Necrotic Quality: Adherent Slough Tendon Exposed: No Muscle Exposed: No Joint  Exposed: No Bone Exposed: No Treatment Notes Wound #1 (Sacrum) Wound Laterality: Medial Cleanser Soap and Water Discharge Instruction: May shower and wash wound with dial antibacterial soap and water prior to dressing change. Wound Cleanser Discharge Instruction: Cleanse the wound with wound cleanser prior to applying a clean dressing using gauze sponges, not tissue or cotton balls. Peri-Wound Care Topical Primary Dressing KerraCel Ag Gelling Fiber Dressing, 4x5 in (silver alginate) Discharge Instruction: Apply silver alginate to wound bed as instructed Secondary Dressing Woven Gauze Sponge, Non-Sterile 4x4 in Discharge Instruction: Apply over primary dressing as directed. ABD Pad, 5x9 Discharge Instruction: Apply over primary dressing as directed. Secured With 61M [place] Surgical T ape, 2x2 (in/yd) Discharge Instruction: Secure dressing with tape as directed. Compression Wrap Compression Stockings Add-Ons Gloves, Medium Electronic Signature(s) Signed: 01/29/2021 5:11:56 PM By: Rhae Hammock RN Entered By: Rhae Hammock on 01/29/2021 14:09:57 -------------------------------------------------------------------------------- Vitals Details Patient Name: Date of Service: Jamie Boyden A. 01/29/2021 1:15 PM Medical Record Number: [id number] Patient Account Number: [id number] Date of Birth/Sex: Treating RN: 11/30/1980 (40 y.o. [name], [name] Primary Care Kwanza Cancelliere: [name] Other Clinician: Referring Kanya Potteiger: Treating Alejandra Hunt/Extender: Mare Ferrari in Treatment: 239 Vital Signs Time Taken: 13:50 Temperature (F): 98.7 Height (in): 65 Pulse (bpm): 74 Weight (lbs): 201 Respiratory Rate (breaths/min): 17 Body Mass Index (BMI): 33.4 Blood Pressure (mmHg): 96/59 Reference Range: 80 - 120 mg / dl Electronic Signature(s) Signed: 01/29/2021 5:11:56 PM By: Rhae Hammock RN Entered By: Rhae Hammock on  01/29/2021 14:07:12
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
## 2021-01-30 ENCOUNTER — Other Ambulatory Visit: Payer: Self-pay | Admitting: Family Medicine

## 2021-01-30 ENCOUNTER — Encounter: Payer: Self-pay | Admitting: Orthopedic Surgery

## 2021-01-30 ENCOUNTER — Ambulatory Visit (INDEPENDENT_AMBULATORY_CARE_PROVIDER_SITE_OTHER): Payer: Medicaid Other | Admitting: Orthopedic Surgery

## 2021-01-30 DIAGNOSIS — M24561 Contracture, right knee: Secondary | ICD-10-CM

## 2021-01-30 DIAGNOSIS — E038 Other specified hypothyroidism: Secondary | ICD-10-CM

## 2021-01-30 DIAGNOSIS — M24562 Contracture, left knee: Secondary | ICD-10-CM

## 2021-02-01 ENCOUNTER — Encounter: Payer: Self-pay | Admitting: Orthopedic Surgery
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
## 2021-02-04 LAB — AEROBIC/ANAEROBIC CULTURE W GRAM STAIN (SURGICAL/DEEP WOUND): Gram Stain: NONE SEEN

## 2021-02-12 ENCOUNTER — Encounter (HOSPITAL_BASED_OUTPATIENT_CLINIC_OR_DEPARTMENT_OTHER): Payer: Medicaid Other | Attending: Internal Medicine | Admitting: Internal Medicine

## 2021-02-12 ENCOUNTER — Other Ambulatory Visit: Payer: Self-pay

## 2021-02-12 DIAGNOSIS — L89154 Pressure ulcer of sacral region, stage 4: Secondary | ICD-10-CM | POA: Insufficient documentation

## 2021-02-12 DIAGNOSIS — G8222 Paraplegia, incomplete: Secondary | ICD-10-CM | POA: Diagnosis not present

## 2021-02-12 NOTE — Progress Notes (Signed)
[name], [name] ([id number]) Visit Report for 02/12/2021 Arrival Information Details Patient Name: Date of Service: [name], [name] 02/12/2021 1:15 PM Medical Record Number: [id number] Patient Account Number: [id number] Date of Birth/Sex: Treating RN: February 01, 1981 (40 y.o. [name] Primary Care Truth Barot: [name] Other Clinician: Referring Dorna Mallet: Treating Mareo Portilla/Extender: Mare Ferrari in Treatment: 21 Visit Information History Since Last Visit Added or deleted any medications: No Patient Arrived: Wheel Chair Any new allergies or adverse reactions: No Arrival Time: 13:03 Had a fall or experienced change in No Accompanied By: Mother activities of daily living that may affect Transfer Assistance: [name] Lift risk of falls: Patient Requires Transmission-Based Precautions: No Signs or symptoms of abuse/neglect since last visito No Patient Has Alerts: No Hospitalized since last visit: No Implantable device outside of the clinic excluding No cellular tissue based products placed in the center since last visit: Has Dressing in Place as Prescribed: Yes Pain Present Now: No Electronic Signature(s) Signed: 02/12/2021 5:08:15 PM By: Lorrin Jackson Entered By: Lorrin Jackson on 02/12/2021 13:07:00 -------------------------------------------------------------------------------- Clinic Level of Care Assessment Details Patient Name: Date of Service: [name], [name] 02/12/2021 1:15 PM Medical Record Number: [id number] Patient Account Number: [id number] Date of Birth/Sex: Treating RN: [birth year] (40 y.o. [name] Primary Care Mckenna Boruff: [name] Other Clinician: Referring Kieley Akter: Treating Dorlene Footman/Extender: Mare Ferrari in Treatment: 23 Clinic Level of Care Assessment Items TOOL 4 Quantity Score X- 1 0 Use when only an EandM is performed on FOLLOW-UP visit ASSESSMENTS - Nursing Assessment /  Reassessment X- 1 10 Reassessment of Co-morbidities (includes updates in patient status) X- 1 5 Reassessment of Adherence to Treatment Plan ASSESSMENTS - Wound and Skin A ssessment / Reassessment X - Simple Wound Assessment / Reassessment - one wound 1 5 '[]'  - 0 Complex Wound Assessment / Reassessment - multiple wounds '[]'  - 0 Dermatologic / Skin Assessment (not related to wound area) ASSESSMENTS - Focused Assessment '[]'  - 0 Circumferential Edema Measurements - multi extremities '[]'  - 0 Nutritional Assessment / Counseling / Intervention '[]'  - 0 Lower Extremity Assessment (monofilament, tuning fork, pulses) '[]'  - 0 Peripheral Arterial Disease Assessment (using hand held doppler) ASSESSMENTS - Ostomy and/or Continence Assessment and Care '[]'  - 0 Incontinence Assessment and Management '[]'  - 0 Ostomy Care Assessment and Management (repouching, etc.) PROCESS - Coordination of Care '[]'  - 0 Simple Patient / Family Education for ongoing care X- 1 20 Complex (extensive) Patient / Family Education for ongoing care X- 1 10 Staff obtains Programmer, systems, Records, T Results / Process Orders est '[]'  - 0 Staff telephones HHA, Nursing Homes / Clarify orders / etc '[]'  - 0 Routine Transfer to another Facility (non-emergent condition) '[]'  - 0 Routine Hospital Admission (non-emergent condition) '[]'  - 0 New Admissions / Biomedical engineer / Ordering NPWT Apligraf, etc. , '[]'  - 0 Emergency Hospital Admission (emergent condition) '[]'  - 0 Simple Discharge Coordination '[]'  - 0 Complex (extensive) Discharge Coordination PROCESS - Special Needs '[]'  - 0 Pediatric / Minor Patient Management '[]'  - 0 Isolation Patient Management '[]'  - 0 Hearing / Language / Visual special needs '[]'  - 0 Assessment of Community assistance (transportation, D/C planning, etc.) '[]'  - 0 Additional assistance / Altered mentation '[]'  - 0 Support Surface(s) Assessment (bed, cushion, seat, etc.) INTERVENTIONS - Wound Cleansing /  Measurement X - Simple Wound Cleansing - one wound 1 5 '[]'  - 0 Complex Wound Cleansing - multiple wounds X- 1 5 Wound Imaging (photographs - any number of  wounds) '[]'  - 0 Wound Tracing (instead of photographs) X- 1 5 Simple Wound Measurement - one wound '[]'  - 0 Complex Wound Measurement - multiple wounds INTERVENTIONS - Wound Dressings '[]'  - 0 Small Wound Dressing one or multiple wounds X- 1 15 Medium Wound Dressing one or multiple wounds '[]'  - 0 Large Wound Dressing one or multiple wounds '[]'  - 0 Application of Medications - topical '[]'  - 0 Application of Medications - injection INTERVENTIONS - Miscellaneous '[]'  - 0 External ear exam '[]'  - 0 Specimen Collection (cultures, biopsies, blood, body fluids, etc.) '[]'  - 0 Specimen(s) / Culture(s) sent or taken to Lab for analysis '[]'  - 0 Patient Transfer (multiple staff / Civil Service fast streamer / Similar devices) '[]'  - 0 Simple Staple / Suture removal (25 or less) '[]'  - 0 Complex Staple / Suture removal (26 or more) '[]'  - 0 Hypo / Hyperglycemic Management (close monitor of Blood Glucose) '[]'  - 0 Ankle / Brachial Index (ABI) - do not check if billed separately X- 1 5 Vital Signs Has the patient been seen at the hospital within the last three years: Yes Total Score: 85 Level Of Care: New/Established - Level 3 Electronic Signature(s) Signed: 02/12/2021 5:08:15 PM By: Lorrin Jackson Entered By: Lorrin Jackson on 02/12/2021 13:30:03 -------------------------------------------------------------------------------- Encounter Discharge Information Details Patient Name: Date of Service: Jamie Boyden A. 02/12/2021 1:15 PM Medical Record Number: [id number] Patient Account Number: [id number] Date of Birth/Sex: Treating RN: [birth year] (40 y.o. [name] Primary Care Dalesha Stanback: [name] Other Clinician: Referring Favian Kittleson: Treating Nasra Counce/Extender: Mare Ferrari in Treatment: [phone] Encounter Discharge Information  Items Discharge Condition: Stable Ambulatory Status: Wheelchair Discharge Destination: Home Transportation: Private Auto Accompanied By: mother Schedule Follow-up Appointment: Yes Clinical Summary of Care: Provided on 02/12/2021 Form Type Recipient Paper Patient Patient Electronic Signature(s) Signed: 02/12/2021 5:08:15 PM By: Lorrin Jackson Entered By: Lorrin Jackson on 02/12/2021 13:48:39 -------------------------------------------------------------------------------- Lower Extremity Assessment Details Patient Name: Date of Service: [name], [name] 02/12/2021 1:15 PM Medical Record Number: [id number] Patient Account Number: [id number] Date of Birth/Sex: Treating RN: 07-16-1980 (40 y.o. [name] Primary Care Marline Morace: [name] Other Clinician: Referring Stiles Maxcy: Treating Mouhamad Teed/Extender: Mare Ferrari in Treatment: 241 Electronic Signature(s) Signed: 02/12/2021 5:08:15 PM By: Lorrin Jackson Entered By: Lorrin Jackson on 02/12/2021 13:07:37 -------------------------------------------------------------------------------- Multi Wound Chart Details Patient Name: Date of Service: [name], [name] 02/12/2021 1:15 PM Medical Record Number: [id number] Patient Account Number: [id number] Date of Birth/Sex: Treating RN: January 01, 1981 (40 y.o. Nancy Fetter Primary Care Illana Nolting: [name] Other Clinician: Referring Admire Deltoro: Treating Srihan Brutus/Extender: Mare Ferrari in Treatment: 241 Vital Signs Height(in): 65 Pulse(bpm): 80 Weight(lbs): 201 Blood Pressure(mmHg): 118/84 Body Mass Index(BMI): 33 Temperature(F): 98.3 Respiratory Rate(breaths/min): 16 Photos: [1:No Photos Medial Sacrum] [N/A:N/A N/A] Wound Location: [1:Pressure Injury] [N/A:N/A] Wounding Event: [1:Pressure Ulcer] [N/A:N/A] Primary Etiology: [1:Anemia, Osteomyelitis] [N/A:N/A] Comorbid History: [1:05/15/2016] [N/A:N/A] Date  Acquired: [1:241] [N/A:N/A] Weeks of Treatment: [1:Open] [N/A:N/A] Wound Status: [1:2x1x2] [N/A:N/A] Measurements L x W x D (cm) [1:1.571] [N/A:N/A] A (cm) : rea [1:3.142] [N/A:N/A] Volume (cm) : [1:95.20%] [N/A:N/A] % Reduction in A rea: [1:97.80%] [N/A:N/A] % Reduction in Volume: [1:Category/Stage IV] [N/A:N/A] Classification: [1:Medium] [N/A:N/A] Exudate A mount: [1:Serosanguineous] [N/A:N/A] Exudate Type: [1:red, brown] [N/A:N/A] Exudate Color: [1:Well defined, not attached] [N/A:N/A] Wound Margin: [1:Large (67-100%)] [N/A:N/A] Granulation A mount: [1:Pink] [N/A:N/A] Granulation Quality: [1:Small (1-33%)] [N/A:N/A] Necrotic A mount: [1:Fat Layer (Subcutaneous Tissue): Yes N/A] Exposed Structures: [1:Fascia: No Tendon: No Muscle: No Joint: No Bone:  No Medium (34-66%)] [N/A:N/A] Epithelialization: [1:Maceration noted] [N/A:N/A] Treatment Notes Electronic Signature(s) Signed: 02/12/2021 4:30:32 PM By: Linton Ham MD Signed: 02/12/2021 4:51:20 PM By: Levan Hurst RN, BSN Entered By: Linton Ham on 02/12/2021 13:29:12 -------------------------------------------------------------------------------- Multi-Disciplinary Care Plan Details Patient Name: Date of Service: [name], Jamie A. 02/12/2021 1:15 PM Medical Record Number: [id number] Patient Account Number: [id number] Date of Birth/Sex: Treating RN: 1980/11/04 (40 y.o. [name] Primary Care Akaash Vandewater: [name] Other Clinician: Referring Rogers Ditter: Treating Channah Godeaux/Extender: Mare Ferrari in Treatment: Tamiami reviewed with physician Active Inactive Wound/Skin Impairment Nursing Diagnoses: Impaired tissue integrity Knowledge deficit related to smoking impact on wound healing Knowledge deficit related to ulceration/compromised skin integrity Goals: Patient will demonstrate a reduced rate of smoking or cessation of smoking Date Initiated:  06/27/2016 Date Inactivated: 01/26/2017 Target Resolution Date: 01/08/2017 Goal Status: Unmet Unmet Reason: pt continues to smoke Patient/caregiver will verbalize understanding of skin care regimen Date Initiated: 06/27/2016 Target Resolution Date: 03/19/2021 Goal Status: Active Ulcer/skin breakdown will have a volume reduction of 50% by week 8 Date Initiated: 06/27/2016 Date Inactivated: 12/11/2016 Target Resolution Date: 07/25/2016 Goal Status: Met Interventions: Assess patient/caregiver ability to obtain necessary supplies Assess patient/caregiver ability to perform ulcer/skin care regimen upon admission and as needed Assess ulceration(s) every visit Provide education on smoking Provide education on ulcer and skin care Treatment Activities: Skin care regimen initiated : 06/27/2016 Topical wound management initiated : 06/27/2016 Notes: Electronic Signature(s) Signed: 02/12/2021 5:08:15 PM By: Lorrin Jackson Entered By: Lorrin Jackson on 02/12/2021 13:02:40 -------------------------------------------------------------------------------- Pain Assessment Details Patient Name: Date of Service: [name], Jamie A. 02/12/2021 1:15 PM Medical Record Number: [id number] Patient Account Number: [id number] Date of Birth/Sex: Treating RN: April 29, 1980 (40 y.o. [name] Primary Care Deakon Frix: [name] Other Clinician: Referring Tiah Heckel: Treating Verneice Caspers/Extender: Mare Ferrari in Treatment: [phone] Active Problems Location of Pain Severity and Description of Pain Patient Has Paino No Site Locations Pain Management and Medication Current Pain Management: Electronic Signature(s) Signed: 02/12/2021 5:08:15 PM By: Lorrin Jackson Entered By: Lorrin Jackson on 02/12/2021 13:07:29 -------------------------------------------------------------------------------- Patient/Caregiver Education Details Patient Name: Date of Service: [name]  12/13/2022andnbsp1:15 PM Medical Record Number: [id number] Patient Account Number: [id number] Date of Birth/Gender: Treating RN: [birth year] (40 y.o. [name] Primary Care Physician: [name] Other Clinician: Referring Physician: Treating Physician/Extender: Mare Ferrari in Treatment: [phone] Education Assessment Education Provided To: Patient and Caregiver Education Topics Provided Pressure: Methods: Explain/Verbal, Printed Responses: State content correctly Wound/Skin Impairment: Methods: Demonstration, Explain/Verbal, Printed Responses: State content correctly Electronic Signature(s) Signed: 02/12/2021 5:08:15 PM By: Lorrin Jackson Entered By: Lorrin Jackson on 02/12/2021 13:03:04 -------------------------------------------------------------------------------- Wound Assessment Details Patient Name: Date of Service: [name], Jamie A. 02/12/2021 1:15 PM Medical Record Number: [id number] Patient Account Number: [id number] Date of Birth/Sex: Treating RN: Aug 10, 1980 (40 y.o. [name] Primary Care Aoife Bold: [name] Other Clinician: Referring Marlisa Caridi: Treating Sholonda Jobst/Extender: Mare Ferrari in Treatment: 241 Wound Status Wound Number: 1 Primary Etiology: Pressure Ulcer Wound Location: Medial Sacrum Wound Status: Open Wounding Event: Pressure Injury Comorbid History: Anemia, Osteomyelitis Date Acquired: 05/15/2016 Weeks Of Treatment: 241 Clustered Wound: No Wound Measurements Length: (cm) 2 Width: (cm) 1 Depth: (cm) 2 Area: (cm) 1.571 Volume: (cm) 3.142 % Reduction in Area: 95.2% % Reduction in Volume: 97.8% Epithelialization: Medium (34-66%) Tunneling: No Undermining: No Wound Description Classification: Category/Stage IV Wound Margin: Well defined, not attached Exudate Amount: Medium Exudate Type: Serosanguineous Exudate Color: red, brown  Foul Odor After Cleansing:  No Slough/Fibrino Yes Wound Bed Granulation Amount: Large (67-100%) Exposed Structure Granulation Quality: Pink Fascia Exposed: No Necrotic Amount: Small (1-33%) Fat Layer (Subcutaneous Tissue) Exposed: Yes Necrotic Quality: Adherent Slough Tendon Exposed: No Muscle Exposed: No Joint Exposed: No Bone Exposed: No Assessment Notes Maceration noted Treatment Notes Wound #1 (Sacrum) Wound Laterality: Medial Cleanser Soap and Water Discharge Instruction: May shower and wash wound with dial antibacterial soap and water prior to dressing change. Wound Cleanser Discharge Instruction: Cleanse the wound with wound cleanser prior to applying a clean dressing using gauze sponges, not tissue or cotton balls. [name]-Wound Care Topical Primary Dressing KerraCel Ag Gelling Fiber Dressing, 4x5 in (silver alginate) Discharge Instruction: Apply silver alginate to wound bed as instructed Secondary Dressing Woven Gauze Sponge, Non-Sterile 4x4 in Discharge Instruction: Apply over primary dressing as directed. ABD Pad, 5x9 Discharge Instruction: Apply over primary dressing as directed. Secured With 27M [place] [employer] T ape, 2x2 (in/yd) Discharge Instruction: Secure dressing with tape as directed. Compression Wrap Compression Stockings Add-Ons Gloves, Medium Electronic Signature(s) Signed: 02/12/2021 5:08:15 PM By: Lorrin Jackson Entered By: Lorrin Jackson on 02/12/2021 13:18:24 -------------------------------------------------------------------------------- Vitals Details Patient Name: Date of Service: Jamie Boyden A. 02/12/2021 1:15 PM Medical Record Number: [id number] Patient Account Number: [id number] Date of Birth/Sex: Treating RN: 1980/12/18 (40 y.o. [name] Primary Care Felicidad Sugarman: Other Clinician: [name] Referring Normal Recinos: Treating Leaha Cuervo/Extender: Mare Ferrari in Treatment: 241 Vital Signs Time Taken:  13:07 Temperature (F): 98.3 Height (in): 65 Pulse (bpm): 80 Weight (lbs): 201 Respiratory Rate (breaths/min): 16 Body Mass Index (BMI): 33.4 Blood Pressure (mmHg): 118/84 Reference Range: 80 - 120 mg / dl Electronic Signature(s) Signed: 02/12/2021 5:08:15 PM By: Lorrin Jackson Entered By: Lorrin Jackson on 02/12/2021 13:07:21
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
## 2021-02-14 ENCOUNTER — Telehealth: Payer: Self-pay | Admitting: Family Medicine

## 2021-02-14 NOTE — Telephone Encounter (Signed)
RTC to [place], [place] have not seen ppw for pt, gave her my direct fax# [phone] to resend ppw to have signed.

## 2021-02-21 ENCOUNTER — Other Ambulatory Visit: Payer: Self-pay | Admitting: Nurse Practitioner

## 2021-02-21 ENCOUNTER — Other Ambulatory Visit: Payer: Self-pay | Admitting: Family Medicine

## 2021-02-21 DIAGNOSIS — G894 Chronic pain syndrome: Secondary | ICD-10-CM

## 2021-02-21 DIAGNOSIS — G8929 Other chronic pain: Secondary | ICD-10-CM

## 2021-02-21 DIAGNOSIS — F410 Panic disorder [episodic paroxysmal anxiety] without agoraphobia: Secondary | ICD-10-CM

## 2021-02-21 DIAGNOSIS — F411 Generalized anxiety disorder: Secondary | ICD-10-CM

## 2021-02-21 DIAGNOSIS — B372 Candidiasis of skin and nail: Secondary | ICD-10-CM

## 2021-02-26 ENCOUNTER — Ambulatory Visit (INDEPENDENT_AMBULATORY_CARE_PROVIDER_SITE_OTHER): Payer: Medicaid Other | Admitting: Family Medicine

## 2021-02-26 ENCOUNTER — Encounter: Payer: Self-pay | Admitting: Family Medicine

## 2021-02-26 VITALS — BP 111/75 | HR 92 | Temp 98.3°F

## 2021-02-26 DIAGNOSIS — E039 Hypothyroidism, unspecified: Secondary | ICD-10-CM

## 2021-02-26 DIAGNOSIS — M24562 Contracture, left knee: Secondary | ICD-10-CM

## 2021-02-26 DIAGNOSIS — F339 Major depressive disorder, recurrent, unspecified: Secondary | ICD-10-CM

## 2021-02-26 DIAGNOSIS — Z0001 Encounter for general adult medical examination with abnormal findings: Secondary | ICD-10-CM | POA: Diagnosis not present

## 2021-02-26 DIAGNOSIS — I10 Essential (primary) hypertension: Secondary | ICD-10-CM

## 2021-02-26 DIAGNOSIS — L89154 Pressure ulcer of sacral region, stage 4: Secondary | ICD-10-CM

## 2021-02-26 DIAGNOSIS — F411 Generalized anxiety disorder: Secondary | ICD-10-CM | POA: Diagnosis not present

## 2021-02-26 DIAGNOSIS — T8389XD Other specified complication of genitourinary prosthetic devices, implants and grafts, subsequent encounter: Secondary | ICD-10-CM

## 2021-02-26 DIAGNOSIS — N319 Neuromuscular dysfunction of bladder, unspecified: Secondary | ICD-10-CM

## 2021-02-26 DIAGNOSIS — Z8639 Personal history of other endocrine, nutritional and metabolic disease: Secondary | ICD-10-CM

## 2021-02-26 DIAGNOSIS — E782 Mixed hyperlipidemia: Secondary | ICD-10-CM

## 2021-02-26 DIAGNOSIS — J069 Acute upper respiratory infection, unspecified: Secondary | ICD-10-CM

## 2021-02-26 DIAGNOSIS — Z79899 Other long term (current) drug therapy: Secondary | ICD-10-CM

## 2021-02-26 DIAGNOSIS — Z0000 Encounter for general adult medical examination without abnormal findings: Secondary | ICD-10-CM

## 2021-02-26 DIAGNOSIS — N368 Other specified disorders of urethra: Secondary | ICD-10-CM

## 2021-02-26 DIAGNOSIS — N3021 Other chronic cystitis with hematuria: Secondary | ICD-10-CM

## 2021-02-26 DIAGNOSIS — B9689 Other specified bacterial agents as the cause of diseases classified elsewhere: Secondary | ICD-10-CM

## 2021-02-26 DIAGNOSIS — K219 Gastro-esophageal reflux disease without esophagitis: Secondary | ICD-10-CM

## 2021-02-26 DIAGNOSIS — G8929 Other chronic pain: Secondary | ICD-10-CM | POA: Diagnosis not present

## 2021-02-26 DIAGNOSIS — M24561 Contracture, right knee: Secondary | ICD-10-CM

## 2021-02-26 DIAGNOSIS — Z9359 Other cystostomy status: Secondary | ICD-10-CM

## 2021-02-26 DIAGNOSIS — T1490XS Injury, unspecified, sequela: Secondary | ICD-10-CM

## 2021-02-26 DIAGNOSIS — K592 Neurogenic bowel, not elsewhere classified: Secondary | ICD-10-CM

## 2021-02-26 DIAGNOSIS — S34101D Unspecified injury to L1 level of lumbar spinal cord, subsequent encounter: Secondary | ICD-10-CM

## 2021-02-26 MED ORDER — LEVOCETIRIZINE DIHYDROCHLORIDE 5 MG PO TABS: 5.0000 mg | ORAL_TABLET | Freq: Every evening | ORAL | 2 refills | Status: DC

## 2021-02-26 MED ORDER — OXYCODONE HCL 10 MG PO TABS: 10.0000 mg | ORAL_TABLET | Freq: Three times a day (TID) | ORAL | 0 refills | Status: DC

## 2021-02-26 MED ORDER — FLUTICASONE PROPIONATE 50 MCG/ACT NA SUSP: 2.0000 | Freq: Every day | NASAL | 2 refills | Status: DC

## 2021-02-26 MED ORDER — AZITHROMYCIN 250 MG PO TABS: ORAL_TABLET | ORAL | 0 refills | Status: DC

## 2021-02-26 MED ORDER — CITALOPRAM HYDROBROMIDE 40 MG PO TABS
40.0000 mg | ORAL_TABLET | Freq: Every day | ORAL | 1 refills | Status: DC
Start: 2021-02-26 — End: 2021-11-25

## 2021-02-26 MED ORDER — KETOROLAC TROMETHAMINE 60 MG/2ML IM SOLN
60.0000 mg | Freq: Once | INTRAMUSCULAR | Status: AC
  Administered 2021-02-26: 12:00:00 60 mg via INTRAMUSCULAR

## 2021-02-26 MED ORDER — VITAMIN D3 50 MCG (2000 UT) PO TABS: 1.0000 | ORAL_TABLET | Freq: Every day | ORAL | 1 refills | Status: DC
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
## 2021-02-27 LAB — TSH: TSH: 1.43 u[IU]/mL (ref 0.450–4.500)

## 2021-02-27 LAB — CMP14+EGFR
ALT: 31 IU/L (ref 0–32)
AST: 27 IU/L (ref 0–40)
Albumin/Globulin Ratio: 1.5 (ref 1.2–2.2)
Albumin: 3.8 g/dL (ref 3.8–4.8)
Alkaline Phosphatase: 101 IU/L (ref 44–121)
BUN/Creatinine Ratio: 10 (ref 9–23)
BUN: 5 mg/dL — ABNORMAL LOW (ref 6–20)
Bilirubin Total: 0.3 mg/dL (ref 0.0–1.2)
CO2: 24 mmol/L (ref 20–29)
Calcium: 8.1 mg/dL — ABNORMAL LOW (ref 8.7–10.2)
Chloride: 99 mmol/L (ref 96–106)
Creatinine, Ser: 0.5 mg/dL — ABNORMAL LOW (ref 0.57–1.00)
Globulin, Total: 2.5 g/dL (ref 1.5–4.5)
Glucose: 108 mg/dL — ABNORMAL HIGH (ref 70–99)
Potassium: 3.3 mmol/L — ABNORMAL LOW (ref 3.5–5.2)
Sodium: 136 mmol/L (ref 134–144)
Total Protein: 6.3 g/dL (ref 6.0–8.5)
eGFR: 122 mL/min/{1.73_m2} (ref >59)

## 2021-02-27 LAB — CBC WITH DIFFERENTIAL/PLATELET
Basophils Absolute: 0 10*3/uL (ref 0.0–0.2)
Basos: 0 %
EOS (ABSOLUTE): 0 10*3/uL (ref 0.0–0.4)
Eos: 0 %
Hematocrit: 35.8 % (ref 34.0–46.6)
Hemoglobin: 11.7 g/dL (ref 11.1–15.9)
Immature Grans (Abs): 0 10*3/uL (ref 0.0–0.1)
Immature Granulocytes: 0 %
Lymphocytes Absolute: 1.6 10*3/uL (ref 0.7–3.1)
Lymphs: 24 %
MCH: 26.4 pg — ABNORMAL LOW (ref 26.6–33.0)
MCHC: 32.7 g/dL (ref 31.5–35.7)
MCV: 81 fL (ref 79–97)
Monocytes Absolute: 0.6 10*3/uL (ref 0.1–0.9)
Monocytes: 9 %
Neutrophils Absolute: 4.5 10*3/uL (ref 1.4–7.0)
Neutrophils: 67 %
Platelets: 311 10*3/uL (ref 150–450)
RBC: 4.43 x10E6/uL (ref 3.77–5.28)
RDW: 14.8 % (ref 11.7–15.4)
WBC: 6.7 10*3/uL (ref 3.4–10.8)

## 2021-02-27 LAB — T4, FREE: Free T4: 1.32 ng/dL (ref 0.82–1.77)

## 2021-02-27 LAB — LIPID PANEL
Chol/HDL Ratio: 3.7 ratio (ref 0.0–4.4)
Cholesterol, Total: 121 mg/dL (ref 100–199)
HDL: 33 mg/dL — ABNORMAL LOW (ref >39)
LDL Chol Calc (NIH): 75 mg/dL (ref 0–99)
Triglycerides: 62 mg/dL (ref 0–149)
VLDL Cholesterol Cal: 13 mg/dL (ref 5–40)

## 2021-03-01 ENCOUNTER — Encounter: Payer: Self-pay | Admitting: Family Medicine

## 2021-03-01 ENCOUNTER — Ambulatory Visit (INDEPENDENT_AMBULATORY_CARE_PROVIDER_SITE_OTHER): Payer: Medicaid Other | Admitting: Family Medicine

## 2021-03-01 DIAGNOSIS — J069 Acute upper respiratory infection, unspecified: Secondary | ICD-10-CM | POA: Diagnosis not present

## 2021-03-01 DIAGNOSIS — U071 COVID-19: Secondary | ICD-10-CM | POA: Diagnosis not present

## 2021-03-01 MED ORDER — MOLNUPIRAVIR EUA 200MG CAPSULE: 4.0000 | ORAL_CAPSULE | Freq: Two times a day (BID) | ORAL | 0 refills | Status: AC
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
## 2021-03-04 ENCOUNTER — Encounter: Payer: Self-pay | Admitting: Family Medicine

## 2021-03-05 ENCOUNTER — Encounter (HOSPITAL_BASED_OUTPATIENT_CLINIC_OR_DEPARTMENT_OTHER): Payer: Medicaid Other | Admitting: Internal Medicine

## 2021-03-13 ENCOUNTER — Encounter (HOSPITAL_BASED_OUTPATIENT_CLINIC_OR_DEPARTMENT_OTHER): Payer: Medicaid Other | Attending: Internal Medicine | Admitting: Internal Medicine

## 2021-03-13 ENCOUNTER — Other Ambulatory Visit: Payer: Self-pay

## 2021-03-13 DIAGNOSIS — G8222 Paraplegia, incomplete: Secondary | ICD-10-CM | POA: Insufficient documentation

## 2021-03-13 DIAGNOSIS — L89154 Pressure ulcer of sacral region, stage 4: Secondary | ICD-10-CM | POA: Diagnosis present

## 2021-03-14 NOTE — Progress Notes (Signed)
[place], [place] ([id number]) Visit Report for 03/13/2021 Arrival Information Details Patient Name: Date of Service: ERRIKA, [name] 03/13/2021 2:30 PM Medical Record Number: [id number] Patient Account Number: [id number] Date of Birth/Sex: Treating RN: 12/05/1980 (41 y.o. Nancy Fetter Primary Care Lyndle Pang: [name] Other Clinician: Referring Zondra Lawlor: Treating Shaquia Berkley/Extender: Mare Ferrari in Treatment: 16 Visit Information History Since Last Visit Added or deleted any medications: No Patient Arrived: Wheel Chair Any new allergies or adverse reactions: No Arrival Time: 15:12 Had a fall or experienced change in No Accompanied By: caregiver activities of daily living that may affect Transfer Assistance: Harrel Lemon Lift risk of falls: Patient Identification Verified: Yes Signs or symptoms of abuse/neglect since last visito No Secondary Verification Process Completed: Yes Hospitalized since last visit: No Patient Requires Transmission-Based Precautions: No Implantable device outside of the clinic excluding No Patient Has Alerts: No cellular tissue based products placed in the center since last visit: Has Dressing in Place as Prescribed: Yes Pain Present Now: Yes Electronic Signature(s) Signed: 03/13/2021 5:58:46 PM By: Levan Hurst RN, BSN Entered By: Levan Hurst on 03/13/2021 15:13:02 -------------------------------------------------------------------------------- Encounter Discharge Information Details Patient Name: Date of Service: [name] 03/13/2021 2:30 PM Medical Record Number: [id number] Patient Account Number: [id number] Date of Birth/Sex: Treating RN: [birth year] (41 y.o. Nancy Fetter Primary Care Nickol Collister: [name] Other Clinician: Referring Makinzy Cleere: Treating Jaedynn Bohlken/Extender: Mare Ferrari in Treatment: [phone] Encounter Discharge Information Items Post Procedure  Vitals Discharge Condition: Stable Temperature (F): 98.9 Ambulatory Status: Wheelchair Pulse (bpm): 97 Discharge Destination: Home Respiratory Rate (breaths/min): 16 Transportation: Private Auto Blood Pressure (mmHg): 114/81 Accompanied By: caregiver Schedule Follow-up Appointment: Yes Clinical Summary of Care: Patient Declined Electronic Signature(s) Signed: 03/13/2021 5:58:46 PM By: Levan Hurst RN, BSN Entered By: Levan Hurst on 03/13/2021 17:14:22 -------------------------------------------------------------------------------- Multi Wound Chart Details Patient Name: Date of Service: [name] 03/13/2021 2:30 PM Medical Record Number: [id number] Patient Account Number: [id number] Date of Birth/Sex: Treating RN: 04-21-1980 (41 y.o. [name], [name] Primary Care Mikeyla Music: [name] Other Clinician: Referring Lovelyn Sheeran: Treating Patte Winkel/Extender: Mare Ferrari in Treatment: 245 Vital Signs Height(in): 65 Pulse(bpm): 97 Weight(lbs): 201 Blood Pressure(mmHg): 114/81 Body Mass Index(BMI): 33 Temperature(F): 98.9 Respiratory Rate(breaths/min): 16 Photos: [N/A:N/A] Medial Sacrum N/A N/A Wound Location: Pressure Injury N/A N/A Wounding Event: Pressure Ulcer N/A N/A Primary Etiology: Anemia, Osteomyelitis N/A N/A Comorbid History: 05/15/2016 N/A N/A Date Acquired: 245 N/A N/A Weeks of Treatment: Open N/A N/A Wound Status: 2.3x1.3x1.8 N/A N/A Measurements L x W x D (cm) 2.348 N/A N/A A (cm) : rea 4.227 N/A N/A Volume (cm) : 92.90% N/A N/A % Reduction in A rea: 97.10% N/A N/A % 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 N/A N/A Classification: Medium N/A N/A Exudate A mount: Serosanguineous N/A N/A Exudate Type: red, brown N/A N/A Exudate Color: Well defined, not attached N/A N/A Wound Margin: Large (67-100%) N/A N/A Granulation A mount: Pink N/A N/A Granulation Quality: Small (1-33%) N/A N/A Necrotic A  mount: Fat Layer (Subcutaneous Tissue): Yes N/A N/A Exposed Structures: Fascia: No Tendon: No Muscle: No Joint: No Bone: No Medium (34-66%) N/A N/A Epithelialization: Debridement - Excisional N/A N/A Debridement: Pre-procedure Verification/Time Out 15:33 N/A N/A Taken: Subcutaneous, Slough N/A N/A Tissue Debrided: Skin/Subcutaneous Tissue N/A N/A Level: 2.99 N/A N/A Debridement A (sq cm): rea Curette N/A N/A Instrument: Minimum N/A N/A Bleeding: Pressure N/A N/A Hemostasis A chieved: 0 N/A N/A Procedural Pain: 0 N/A N/A Post Procedural Pain: Procedure was  tolerated well N/A N/A Debridement Treatment Response: 2.3x1.3x1.8 N/A N/A Post Debridement Measurements L x W x D (cm) 4.227 N/A N/A Post Debridement Volume: (cm) Category/Stage IV N/A N/A Post Debridement Stage: Debridement N/A N/A Procedures Performed: Treatment Notes Electronic Signature(s) Signed: 03/13/2021 4:30:17 PM By: Rhae Hammock RN Signed: 03/14/2021 7:40:11 AM By: Linton Ham MD Entered By: Linton Ham on 03/13/2021 15:58:32 -------------------------------------------------------------------------------- Multi-Disciplinary Care Plan Details Patient Name: Date of Service: [name] 03/13/2021 2:30 PM Medical Record Number: [id number] Patient Account Number: [id number] Date of Birth/Sex: Treating RN: 1980/11/15 (41 y.o. Nancy Fetter Primary Care Zygmund Passero: [name] Other Clinician: Referring Hiba Garry: Treating Lonny Eisen/Extender: Mare Ferrari in Treatment: Rea reviewed with physician Active Inactive Wound/Skin Impairment Nursing Diagnoses: Impaired tissue integrity Knowledge deficit related to smoking impact on wound healing Knowledge deficit related to ulceration/compromised skin integrity Goals: Patient will demonstrate a reduced rate of smoking or cessation of smoking Date Initiated: 06/27/2016 Date  Inactivated: 01/26/2017 Target Resolution Date: 01/08/2017 Goal Status: Unmet Unmet Reason: pt continues to smoke Patient/caregiver will verbalize understanding of skin care regimen Date Initiated: 06/27/2016 Target Resolution Date: 04/12/2021 Goal Status: Active Ulcer/skin breakdown will have a volume reduction of 50% by week 8 Date Initiated: 06/27/2016 Date Inactivated: 12/11/2016 Target Resolution Date: 07/25/2016 Goal Status: Met Interventions: Assess patient/caregiver ability to obtain necessary supplies Assess patient/caregiver ability to perform ulcer/skin care regimen upon admission and as needed Assess ulceration(s) every visit Provide education on smoking Provide education on ulcer and skin care Treatment Activities: Skin care regimen initiated : 06/27/2016 Topical wound management initiated : 06/27/2016 Notes: Electronic Signature(s) Signed: 03/13/2021 5:58:46 PM By: Levan Hurst RN, BSN Entered By: Levan Hurst on 03/13/2021 15:30:17 -------------------------------------------------------------------------------- Pain Assessment Details Patient Name: Date of Service: [name] 03/13/2021 2:30 PM Medical Record Number: [id number] Patient Account Number: [id number] Date of Birth/Sex: Treating RN: Oct 05, 1980 (41 y.o. Nancy Fetter Primary Care Aleksandr Pellow: [name] Other Clinician: Referring Ulises Wolfinger: Treating Adorian Gwynne/Extender: Mare Ferrari in Treatment: [phone] Active Problems Location of Pain Severity and Description of Pain Patient Has Paino Yes Site Locations Pain Location: Pain in Ulcers With Dressing Change: Yes Duration of the Pain. Constant / Intermittento Intermittent Rate the pain. Current Pain Level: 6 Character of Pain Describe the Pain: Throbbing Pain Management and Medication Current Pain Management: Medication: Yes Cold Application: No Rest: No Massage: No Activity: No T.E.N.S.: No Heat  Application: No Leg drop or elevation: No Is the Current Pain Management Adequate: Adequate How does your wound impact your activities of daily livingo Sleep: No Bathing: No Appetite: No Relationship With Others: No Bladder Continence: No Emotions: No Bowel Continence: No Work: No Toileting: No Drive: No Dressing: No Hobbies: No Electronic Signature(s) Signed: 03/13/2021 5:58:46 PM By: Levan Hurst RN, BSN Entered By: Levan Hurst on 03/13/2021 15:21:08 -------------------------------------------------------------------------------- Patient/Caregiver Education Details Patient Name: Date of Service: Karlton Lemon 1/11/2023andnbsp2:30 PM Medical Record Number: [id number] Patient Account Number: [id number] Date of Birth/Gender: Treating RN: [birth year] (41 y.o. Nancy Fetter Primary Care Physician: [name] Other Clinician: Referring Physician: Treating Physician/Extender: Mare Ferrari in Treatment: [phone] Education Assessment Education Provided To: Patient Education Topics Provided Wound/Skin Impairment: Methods: Explain/Verbal Responses: State content correctly [employer]) Signed: 03/13/2021 5:58:46 PM By: Levan Hurst RN, BSN Entered By: Levan Hurst on 03/13/2021 15:30:40 -------------------------------------------------------------------------------- Wound Assessment Details Patient Name: Date of Service: [name] 03/13/2021 2:30 PM Medical Record Number: [id number] Patient Account Number:  [id number] Date of Birth/Sex: Treating RN: 15-Dec-1980 (41 y.o. Nancy Fetter Primary Care Leith Hedlund: [name] Other Clinician: Referring Mohid Furuya: Treating Vandy Fong/Extender: Mare Ferrari in Treatment: 245 Wound Status Wound Number: 1 Primary Etiology: Pressure Ulcer Wound Location: Medial Sacrum Wound Status: Open Wounding Event: Pressure Injury Comorbid History:  Anemia, Osteomyelitis Date Acquired: 05/15/2016 Weeks Of Treatment: 245 Clustered Wound: No Photos Wound Measurements Length: (cm) 2.3 Width: (cm) 1.3 Depth: (cm) 1.8 Area: (cm) 2.348 Volume: (cm) 4.227 % Reduction in Area: 92.9% % Reduction in Volume: 97.1% Epithelialization: Medium (34-66%) Tunneling: No Undermining: No Wound Description Classification: Category/Stage IV Wound Margin: Well defined, not attached Exudate Amount: Medium Exudate Type: Serosanguineous Exudate Color: red, brown Foul Odor After Cleansing: No Slough/Fibrino Yes Wound Bed Granulation Amount: Large (67-100%) Exposed Structure Granulation Quality: Pink Fascia Exposed: No Necrotic Amount: Small (1-33%) Fat Layer (Subcutaneous Tissue) Exposed: Yes Necrotic Quality: Adherent Slough Tendon Exposed: No Muscle Exposed: No Joint Exposed: No Bone Exposed: No Treatment Notes Wound #1 (Sacrum) Wound Laterality: Medial Cleanser Soap and Water Discharge Instruction: May shower and wash wound with dial antibacterial soap and water prior to dressing change. Wound Cleanser Discharge Instruction: Cleanse the wound with wound cleanser prior to applying a clean dressing using gauze sponges, not tissue or cotton balls. Peri-Wound Care Topical Primary Dressing KerraCel Ag Gelling Fiber Dressing, 4x5 in (silver alginate) Discharge Instruction: Apply silver alginate to wound bed as instructed Secondary Dressing Woven Gauze Sponge, Non-Sterile 4x4 in Discharge Instruction: Apply over primary dressing as directed. ABD Pad, 5x9 Discharge Instruction: Apply over primary dressing as directed. Secured With [employer] Surgical T 2x10 (in/yd) ape Discharge Instruction: Secure with tape as directed. Compression Wrap Compression Stockings Add-Ons Gloves, Medium Electronic Signature(s) Signed: 03/13/2021 5:58:46 PM By: Levan Hurst RN, BSN Entered By: Levan Hurst on 03/13/2021  15:26:22 -------------------------------------------------------------------------------- Vitals Details Patient Name: Date of Service: [name] 03/13/2021 2:30 PM Medical Record Number: [id number] Patient Account Number: [id number] Date of Birth/Sex: Treating RN: [birth year] (41 y.o. Nancy Fetter Primary Care Kamyia Thomason: [name] Other Clinician: Referring Rakiya Krawczyk: Treating Silvie Obremski/Extender: Mare Ferrari in Treatment: 245 Vital Signs Time Taken: 15:12 Temperature (F): 98.9 Height (in): 65 Pulse (bpm): 97 Weight (lbs): 201 Respiratory Rate (breaths/min): 16 Body Mass Index (BMI): 33.4 Blood Pressure (mmHg): 114/81 Reference Range: 80 - 120 mg / dl Electronic Signature(s) Signed: 03/13/2021 5:58:46 PM By: Levan Hurst RN, BSN Entered By: Levan Hurst on 03/13/2021 17:14:20
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
## 2021-03-16 ENCOUNTER — Other Ambulatory Visit: Payer: Self-pay | Admitting: Family Medicine

## 2021-03-16 DIAGNOSIS — B9689 Other specified bacterial agents as the cause of diseases classified elsewhere: Secondary | ICD-10-CM

## 2021-03-16 DIAGNOSIS — B372 Candidiasis of skin and nail: Secondary | ICD-10-CM

## 2021-03-22 ENCOUNTER — Other Ambulatory Visit: Payer: Self-pay | Admitting: Family Medicine

## 2021-03-22 DIAGNOSIS — B372 Candidiasis of skin and nail: Secondary | ICD-10-CM

## 2021-03-28 ENCOUNTER — Other Ambulatory Visit: Payer: Self-pay

## 2021-03-28 ENCOUNTER — Encounter (HOSPITAL_BASED_OUTPATIENT_CLINIC_OR_DEPARTMENT_OTHER): Payer: Medicaid Other | Admitting: Internal Medicine

## 2021-03-28 DIAGNOSIS — L89154 Pressure ulcer of sacral region, stage 4: Secondary | ICD-10-CM | POA: Diagnosis not present

## 2021-03-28 NOTE — Progress Notes (Signed)
[name], EARLY ([id number]) Visit Report for 03/28/2021 Arrival Information Details Patient Name: Date of Service: [name], [name] 03/28/2021 1:15 PM Medical Record Number: [id number] Patient Account Number: [id number] Date of Birth/Sex: Treating RN: 1980-11-30 (41 y.o. [name], [name] Primary Care Provider: [name] Other Clinician: Referring Provider: Treating Provider/Extender: Mare Ferrari in Treatment: 65 Visit Information History Since Last Visit Added or deleted any medications: No Patient Arrived: Wheel Chair Any new allergies or adverse reactions: No Arrival Time: 13:47 Had a fall or experienced change in No Accompanied By: mother activities of daily living that may affect Transfer Assistance: Harrel [name] Lift risk of falls: Patient Identification Verified: Yes Signs or symptoms of abuse/neglect since last visito No Secondary Verification Process Completed: Yes Hospitalized since last visit: No Patient Requires Transmission-Based Precautions: No Implantable device outside of the clinic excluding No Patient Has Alerts: No cellular tissue based products placed in the center since last visit: Has Dressing in Place as Prescribed: Yes Pain Present Now: No Electronic Signature(s) Signed: 03/28/2021 3:23:49 PM By: Sandre Kitty Entered By: Sandre Kitty on 03/28/2021 13:47:45 -------------------------------------------------------------------------------- Clinic Level of Care Assessment Details Patient Name: Date of Service: [name], [name] 03/28/2021 1:15 PM Medical Record Number: [id number] Patient Account Number: [id number] Date of Birth/Sex: Treating RN: May 19, 1980 (41 y.o. [name] Primary Care Provider: [name] Other Clinician: Referring Provider: Treating Provider/Extender: Mare Ferrari in Treatment: 247 Clinic Level of Care Assessment Items TOOL 4 Quantity Score [] - 0 Use  when only an EandM is performed on FOLLOW-UP visit ASSESSMENTS - Nursing Assessment / Reassessment X- 1 10 Reassessment of Co-morbidities (includes updates in patient status) X- 1 5 Reassessment of Adherence to Treatment Plan ASSESSMENTS - Wound and Skin A ssessment / Reassessment X - Simple Wound Assessment / Reassessment - one wound 1 5 [] - 0 Complex Wound Assessment / Reassessment - multiple wounds [] - 0 Dermatologic / Skin Assessment (not related to wound area) ASSESSMENTS - Focused Assessment [] - 0 Circumferential Edema Measurements - multi extremities [] - 0 Nutritional Assessment / Counseling / Intervention [] - 0 Lower Extremity Assessment (monofilament, tuning fork, pulses) [] - 0 Peripheral Arterial Disease Assessment (using hand held doppler) ASSESSMENTS - Ostomy and/or Continence Assessment and Care [] - 0 Incontinence Assessment and Management [] - 0 Ostomy Care Assessment and Management (repouching, etc.) PROCESS - Coordination of Care X - Simple Patient / Family Education for ongoing care 1 15 [] - 0 Complex (extensive) Patient / Family Education for ongoing care X- 1 10 Staff obtains Programmer, systems, Records, T Results / Process Orders est [] - 0 Staff telephones HHA, Nursing Homes / Clarify orders / etc [] - 0 Routine Transfer to another Facility (non-emergent condition) [] - 0 Routine Hospital Admission (non-emergent condition) [] - 0 New Admissions / Biomedical engineer / Ordering NPWT Apligraf, etc. , [] - 0 Emergency Hospital Admission (emergent condition) X- 1 10 Simple Discharge Coordination [] - 0 Complex (extensive) Discharge Coordination PROCESS - Special Needs [] - 0 Pediatric / Minor Patient Management [] - 0 Isolation Patient Management [] - 0 Hearing / Language / Visual special needs [] - 0 Assessment of Community assistance (transportation, D/C planning, etc.) [] - 0 Additional assistance / Altered mentation [] - 0 Support  Surface(s) Assessment (bed, cushion, seat, etc.) INTERVENTIONS - Wound Cleansing / Measurement X - Simple Wound Cleansing - one wound 1 5 [] - 0 Complex Wound Cleansing - multiple wounds  X- 1 5 Wound Imaging (photographs - any number of wounds) [] - 0 Wound Tracing (instead of photographs) X- 1 5 Simple Wound Measurement - one wound [] - 0 Complex Wound Measurement - multiple wounds INTERVENTIONS - Wound Dressings X - Small Wound Dressing one or multiple wounds 1 10 [] - 0 Medium Wound Dressing one or multiple wounds [] - 0 Large Wound Dressing one or multiple wounds X- 1 5 Application of Medications - topical [] - 0 Application of Medications - injection INTERVENTIONS - Miscellaneous [] - 0 External ear exam [] - 0 Specimen Collection (cultures, biopsies, blood, body fluids, etc.) [] - 0 Specimen(s) / Culture(s) sent or taken to Lab for analysis X- 1 10 Patient Transfer (multiple staff / Harrel [name] Lift / Similar devices) [] - 0 Simple Staple / Suture removal (25 or less) [] - 0 Complex Staple / Suture removal (26 or more) [] - 0 Hypo / Hyperglycemic Management (close monitor of Blood Glucose) [] - 0 Ankle / Brachial Index (ABI) - do not check if billed separately X- 1 5 Vital Signs Has the patient been seen at the hospital within the last three years: Yes Total Score: 100 Level Of Care: New/Established - Level 3 Electronic Signature(s) Signed: 03/28/2021 6:32:11 PM By: Baruch Gouty RN, BSN Entered By: Baruch Gouty on 03/28/2021 14:06:49 -------------------------------------------------------------------------------- Encounter Discharge Information Details Patient Name: Date of Service: Jamie Boyden A. 03/28/2021 1:15 PM Medical Record Number: [id number] Patient Account Number: [id number] Date of Birth/Sex: Treating RN: 12-19-1980 (40 y.o. [name] Primary Care Provider: [name] Other Clinician: Referring Provider: Treating  Provider/Extender: Mare Ferrari in Treatment: [phone] Encounter Discharge Information Items Discharge Condition: Stable Ambulatory Status: Wheelchair Discharge Destination: Home Transportation: Other Accompanied By: mother Schedule Follow-up Appointment: Yes Clinical Summary of Care: Patient Declined Notes transportation service Electronic Signature(s) Signed: 03/28/2021 6:32:11 PM By: Baruch Gouty RN, BSN Entered By: Baruch Gouty on 03/28/2021 14:22:48 -------------------------------------------------------------------------------- Lower Extremity Assessment Details Patient Name: Date of Service: [name], [name] A. 03/28/2021 1:15 PM Medical Record Number: [id number] Patient Account Number: [id number] Date of Birth/Sex: Treating RN: [birth year] (41 y.o. [name] Primary Care Provider: [name] Other Clinician: Referring Provider: Treating Provider/Extender: Mare Ferrari in Treatment: 099 Electronic Signature(s) Signed: 03/28/2021 6:32:11 PM By: Baruch Gouty RN, BSN Entered By: Baruch Gouty on 03/28/2021 13:56:30 -------------------------------------------------------------------------------- Multi Wound Chart Details Patient Name: Date of Service: [name], [name] 03/28/2021 1:15 PM Medical Record Number: [id number] Patient Account Number: [id number] Date of Birth/Sex: Treating RN: April 07, 1980 (41 y.o. [name] Primary Care Provider: [name] Other Clinician: Referring Provider: Treating Provider/Extender: Mare Ferrari in Treatment: 247 Vital Signs Height(in): 65 Pulse(bpm): 101 Weight(lbs): 201 Blood Pressure(mmHg): 110/78 Body Mass Index(BMI): 33.4 Temperature(F): 98.5 Respiratory Rate(breaths/min): 16 Photos: [N/A:N/A] Medial Sacrum N/A N/A Wound Location: Pressure Injury N/A N/A Wounding Event: Pressure Ulcer N/A N/A Primary Etiology: Anemia,  Osteomyelitis N/A N/A Comorbid History: 05/15/2016 N/A N/A Date Acquired: 43 N/A N/A Weeks of Treatment: Open N/A N/A Wound Status: No N/A N/A Wound Recurrence: 2.4x0.9x1.3 N/A N/A Measurements L x W x D (cm) 1.696 N/A N/A A (cm) : rea 2.205 N/A N/A Volume (cm) : 94.80% N/A N/A % Reduction in A rea: 98.50% N/A N/A % Reduction in Volume: 12 Starting Position 1 (o'clock): 2 Ending Position 1 (o'clock): 0.7 Maximum Distance 1 (cm): Yes N/A N/A Undermining: Category/Stage IV N/A N/A Classification: Medium N/A N/A Exudate A mount: Serosanguineous  N/A N/A Exudate Type: red, brown N/A N/A Exudate Color: Well defined, not attached N/A N/A Wound Margin: Large (67-100%) N/A N/A Granulation A mount: Red, Pink N/A N/A Granulation Quality: None Present (0%) N/A N/A Necrotic A mount: Fat Layer (Subcutaneous Tissue): Yes N/A N/A Exposed Structures: Fascia: No Tendon: No Muscle: No Joint: No Bone: No Medium (34-66%) N/A N/A Epithelialization: Treatment Notes Electronic Signature(s) Signed: 03/28/2021 6:02:21 PM By: Deon Pilling RN, BSN Signed: 03/28/2021 6:04:31 PM By: Linton Ham MD Entered By: Linton Ham on 03/28/2021 14:18:40 -------------------------------------------------------------------------------- Multi-Disciplinary Care Plan Details Patient Name: Date of Service: [name], [name] A. 03/28/2021 1:15 PM Medical Record Number: [id number] Patient Account Number: [id number] Date of Birth/Sex: Treating RN: 1980/03/06 (41 y.o. [name] Primary Care Marielouise Amey: [name] Other Clinician: Referring Corinda Ammon: Treating Meilah Delrosario/Extender: Mare Ferrari in Treatment: Neligh reviewed with physician Active Inactive Wound/Skin Impairment Nursing Diagnoses: Impaired tissue integrity Knowledge deficit related to smoking impact on wound healing Knowledge deficit related to  ulceration/compromised skin integrity Goals: Patient will demonstrate a reduced rate of smoking or cessation of smoking Date Initiated: 06/27/2016 Date Inactivated: 01/26/2017 Target Resolution Date: 01/08/2017 Goal Status: Unmet Unmet Reason: pt continues to smoke Patient/caregiver will verbalize understanding of skin care regimen Date Initiated: 06/27/2016 Target Resolution Date: 04/12/2021 Goal Status: Active Ulcer/skin breakdown will have a volume reduction of 50% by week 8 Date Initiated: 06/27/2016 Date Inactivated: 12/11/2016 Target Resolution Date: 07/25/2016 Goal Status: Met Interventions: Assess patient/caregiver ability to obtain necessary supplies Assess patient/caregiver ability to perform ulcer/skin care regimen upon admission and as needed Assess ulceration(s) every visit Provide education on smoking Provide education on ulcer and skin care Treatment Activities: Skin care regimen initiated : 06/27/2016 Topical wound management initiated : 06/27/2016 Notes: Electronic Signature(s) Signed: 03/28/2021 6:32:11 PM By: Baruch Gouty RN, BSN Entered By: Baruch Gouty on 03/28/2021 13:58:32 -------------------------------------------------------------------------------- Pain Assessment Details Patient Name: Date of Service: [name], [name] A. 03/28/2021 1:15 PM Medical Record Number: [id number] Patient Account Number: [id number] Date of Birth/Sex: Treating RN: 12-Nov-1980 (41 y.o. [name] Primary Care Darin Arndt: [name] Other Clinician: Referring Parnika Tweten: Treating Shauntel Prest/Extender: Mare Ferrari in Treatment: [phone] Active Problems Location of Pain Severity and Description of Pain Patient Has Paino No Site Locations Rate the pain. Rate the pain. Current Pain Level: 0 Pain Management and Medication Current Pain Management: Electronic Signature(s) Signed: 03/28/2021 6:02:21 PM By: Deon Pilling RN, BSN Signed: 03/28/2021  6:32:11 PM By: Baruch Gouty RN, BSN Entered By: Baruch Gouty on 03/28/2021 13:56:21 -------------------------------------------------------------------------------- Patient/Caregiver Education Details Patient Name: Date of Service: [name] [name] 1/26/2023andnbsp1:15 PM Medical Record Number: [id number] Patient Account Number: [id number] Date of Birth/Gender: Treating RN: 08-22-1980 (41 y.o. [name] Primary Care Physician: [name] Other Clinician: Referring Physician: Treating Physician/Extender: Mare Ferrari in Treatment: [phone] Education Assessment Education Provided To: Patient Education Topics Provided Wound/Skin Impairment: Methods: Explain/Verbal Responses: Reinforcements needed, State content correctly [employer]) Signed: 03/28/2021 6:32:11 PM By: Baruch Gouty RN, BSN Entered By: Baruch Gouty on 03/28/2021 13:58:52 -------------------------------------------------------------------------------- Wound Assessment Details Patient Name: Date of Service: [name], [name] A. 03/28/2021 1:15 PM Medical Record Number: [id number] Patient Account Number: [id number] Date of Birth/Sex: Treating RN: 1981/01/18 (41 y.o. [name], [name] Primary Care Lesly Joslyn: [name] Other Clinician: Referring Shaneal Barasch: Treating Stillman Buenger/Extender: Mare Ferrari in Treatment: 247 Wound Status Wound Number: 1 Primary Etiology: Pressure Ulcer Wound Location: Medial Sacrum Wound Status: Open Wounding Event: Pressure  Injury Comorbid History: Anemia, Osteomyelitis Date Acquired: 05/15/2016 Weeks Of Treatment: 247 Clustered Wound: No Photos Wound Measurements Length: (cm) 2.4 Width: (cm) 0.9 Depth: (cm) 1.3 Area: (cm) 1.696 Volume: (cm) 2.205 % Reduction in Area: 94.8% % Reduction in Volume: 98.5% Epithelialization: Medium (34-66%) Tunneling: No Undermining: Yes Starting Position  (o'clock): 12 Ending Position (o'clock): 2 Maximum Distance: (cm) 0.7 Wound Description Classification: Category/Stage IV Wound Margin: Well defined, not attached Exudate Amount: Medium Exudate Type: Serosanguineous Exudate Color: red, brown Foul Odor After Cleansing: No Slough/Fibrino Yes Wound Bed Granulation Amount: Large (67-100%) Exposed Structure Granulation Quality: Red, Pink Fascia Exposed: No Necrotic Amount: None Present (0%) Fat Layer (Subcutaneous Tissue) Exposed: Yes Tendon Exposed: No Muscle Exposed: No Joint Exposed: No Bone Exposed: No Treatment Notes Wound #1 (Sacrum) Wound Laterality: Medial Cleanser Soap and Water Discharge Instruction: May shower and wash wound with dial antibacterial soap and water prior to dressing change. Wound Cleanser Discharge Instruction: Cleanse the wound with wound cleanser prior to applying a clean dressing using gauze sponges, not tissue or cotton balls. Peri-Wound Care Topical Gentamicin Discharge Instruction: thin layer to wound bed Primary Dressing KerraCel Ag Gelling Fiber Dressing, 4x5 in (silver alginate) Discharge Instruction: Apply silver alginate to wound bed as instructed Secondary Dressing Woven Gauze Sponge, Non-Sterile 4x4 in Discharge Instruction: Apply over primary dressing as directed. ABD Pad, 5x9 Discharge Instruction: Apply over primary dressing as directed. Secured With [employer] Surgical T 2x10 (in/yd) ape Discharge Instruction: Secure with tape as directed. Compression Wrap Compression Stockings Add-Ons Gloves, Medium Electronic Signature(s) Signed: 03/28/2021 5:37:29 PM By: Rhae Hammock RN Signed: 03/28/2021 6:32:11 PM By: Baruch Gouty RN, BSN Entered By: Baruch Gouty on 03/28/2021 13:57:31 -------------------------------------------------------------------------------- [place] Details Patient Name: Date of Service: [name], [name] 03/28/2021 1:15 PM Medical Record  Number: [id number] Patient Account Number: [id number] Date of Birth/Sex: Treating RN: 1981-01-14 (41 y.o. [name], [name] Primary Care Dyllan Kats: [name] Other Clinician: Referring Ahmad Vanwey: Treating Shahram Alexopoulos/Extender: Mare Ferrari in Treatment: 247 Vital Signs Time Taken: 13:47 Temperature (F): 98.5 Height (in): 65 Pulse (bpm): 101 Weight (lbs): 201 Respiratory Rate (breaths/min): 16 Body Mass Index (BMI): 33.4 Blood Pressure (mmHg): 110/78 Reference Range: 80 - 120 mg / dl Electronic Signature(s) Signed: 03/28/2021 3:23:49 PM By: Sandre Kitty Entered By: Sandre Kitty on 03/28/2021 13:48:06

## 2021-03-28 NOTE — Pre-Procedure Instructions (Signed)
Surgical Instructions    Your procedure is scheduled on Tuesday, April 02, 2021 at 11:18 AM.  Report to [facility] Main Entrance "A" at 9:15 A.M., then check in with the Admitting office.  Call this number if you have problems the morning of surgery:  [phone]   If you have any questions prior to your surgery date call [phone]: Open Monday-Friday 8am-4pm    Remember:  Do not eat after midnight the night before your surgery  You may drink clear liquids until 8:15 AM the morning of your surgery.   Clear liquids allowed are: Water, Non-Citrus Juices (without pulp), Carbonated Beverages, Clear Tea, Black Coffee Only (NO MILK, CREAM OR POWDERED CREAMER of any kind), and Gatorade.   Enhanced Recovery after Surgery for Orthopedics Enhanced Recovery after Surgery is a protocol used to improve the stress on your body and your recovery after surgery.  Patient Instructions  The day of surgery (if you do NOT have diabetes):  Drink ONE (1) Pre-Surgery Clear Ensure by 8:15 am the morning of surgery   This drink was given to you during your hospital  pre-op appointment visit. Nothing else to drink after completing the  Pre-Surgery Clear Ensure.         If you have questions, please contact your surgeons office.     Take these medicines the morning of surgery with A SIP OF WATER:  buPROPion (WELLBUTRIN SR) busPIRone (BUSPAR) citalopram (CELEXA) gabapentin (NEURONTIN)  levothyroxine (SYNTHROID)  omeprazole (PRILOSEC) oxybutynin (DITROPAN-XL) Oxycodone HCl sulfamethoxazole-trimethoprim (BACTRIM)  IF NEEDED: acetaminophen (TYLENOL)  baclofen (LIORESAL) COMBIVENT RESPIMAT cyclobenzaprine (FLEXERIL) fluconazole (DIFLUCAN)  fluticasone (FLONASE)   As of today, STOP taking any Aspirin (unless otherwise instructed by your surgeon) Aleve, Naproxen, Ibuprofen, Motrin, Advil, Goody's, BC's, diclofenac Sodium (VOLTAREN) 1 % GEL, all herbal medications, fish oil, and all  vitamins.                     Do NOT Smoke (Tobacco/Vaping) for 24 hours prior to your procedure.  If you use a CPAP at night, you may bring your mask/headgear for your overnight stay.   Contacts, glasses, piercing's, hearing aid's, dentures or partials may not be worn into surgery, please bring cases for these belongings.    For patients admitted to the hospital, discharge time will be determined by your treatment team.   Patients discharged the day of surgery will not be allowed to drive home, and someone needs to stay with them for 24 hours.  NO VISITORS WILL BE ALLOWED IN PRE-OP WHERE PATIENTS ARE PREPPED FOR SURGERY.  ONLY 1 SUPPORT PERSON MAY BE PRESENT IN THE WAITING ROOM WHILE YOU ARE IN SURGERY.  IF YOU ARE TO BE ADMITTED, ONCE YOU ARE IN YOUR ROOM YOU WILL BE ALLOWED TWO (2) VISITORS. (1) VISITOR MAY STAY OVERNIGHT BUT MUST ARRIVE TO THE ROOM BY 8pm.  Minor children may have two parents present. Special consideration for safety and communication needs will be reviewed on a case by case basis.   Special instructions:   [place]- Preparing For Surgery  Before surgery, you can play an important role. Because skin is not sterile, your skin needs to be as free of germs as possible. You can reduce the number of germs on your skin by washing with CHG (chlorahexidine gluconate) Soap before surgery.  CHG is an antiseptic cleaner which kills germs and bonds with the skin to continue killing germs even after washing.    Oral Hygiene is also important  to reduce your risk of infection.  Remember - BRUSH YOUR TEETH THE MORNING OF SURGERY WITH YOUR REGULAR TOOTHPASTE  Please do not use if you have an allergy to CHG or antibacterial soaps. If your skin becomes reddened/irritated stop using the CHG.  Do not shave (including legs and underarms) for at least 48 hours prior to first CHG shower. It is OK to shave your face.  Please follow these instructions carefully.   Shower the NIGHT BEFORE  SURGERY and the MORNING OF SURGERY  If you chose to wash your hair, wash your hair first as usual with your normal shampoo.  After you shampoo, rinse your hair and body thoroughly to remove the shampoo.  Use CHG Soap as you would any other liquid soap. You can apply CHG directly to the skin and wash gently with a scrungie or a clean washcloth.   Apply the CHG Soap to your body ONLY FROM THE NECK DOWN.  Do not use on open wounds or open sores. Avoid contact with your eyes, ears, mouth and genitals (private parts). Wash Face and genitals (private parts)  with your normal soap.   Wash thoroughly, paying special attention to the area where your surgery will be performed.  Thoroughly rinse your body with warm water from the neck down.  DO NOT shower/wash with your normal soap after using and rinsing off the CHG Soap.  Pat yourself dry with a CLEAN TOWEL.  Wear CLEAN PAJAMAS to bed the night before surgery  Place CLEAN SHEETS on your bed the night before your surgery  DO NOT SLEEP WITH PETS.   Day of Surgery: Shower with CHG soap. Do not wear jewelry, make up, nail polish, gel polish, artificial nails, or any other type of covering on natural nails including finger and toenails. If patients have artificial nails, gel coating, etc. that need to be removed by a nail salon please have this removed prior to surgery. Surgery may need to be canceled/delayed if the surgeon/anesthesiologist feels like the patient is unable to be adequately monitored. Do not wear lotions, powders, perfumes, or deodorant. Do not shave 48 hours prior to surgery. Do not bring valuables to the hospital. [facility] is not responsible for any belongings or valuables. Wear Clean/Comfortable clothing the morning of surgery Remember to brush your teeth WITH YOUR REGULAR TOOTHPASTE.   Please read over the following fact sheets that you were given.   3 days prior to your procedure or After your COVID test   You are  not required to quarantine however you are required to wear a well-fitting mask when you are out and around people not in your household. If your mask becomes wet or soiled, replace with a new one.   Wash your hands often with soap and water for 20 seconds or clean your hands with an alcohol-based hand sanitizer that contains at least 60% alcohol.   Do not share personal items.   Notify your provider:  o if you are in close contact with someone who has COVID  o or if you develop a fever of 100.4 or greater, sneezing, cough, sore throat, shortness of breath or body aches.
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
## 2021-03-29 ENCOUNTER — Encounter (HOSPITAL_COMMUNITY): Payer: Self-pay

## 2021-03-29 ENCOUNTER — Other Ambulatory Visit: Payer: Self-pay

## 2021-03-29 ENCOUNTER — Encounter (HOSPITAL_COMMUNITY)
Admission: RE | Admit: 2021-03-29 | Discharge: 2021-03-29 | Disposition: A | Discharge status: Home / Self Care | Payer: Medicaid Other | Source: Ambulatory Visit | Attending: Orthopedic Surgery | Admitting: Orthopedic Surgery

## 2021-03-29 VITALS — BP 126/73 | HR 77 | Temp 98.3°F | Resp 18 | Ht 66.0 in | Wt 290.0 lb

## 2021-03-29 DIAGNOSIS — N319 Neuromuscular dysfunction of bladder, unspecified: Secondary | ICD-10-CM | POA: Diagnosis not present

## 2021-03-29 DIAGNOSIS — K592 Neurogenic bowel, not elsewhere classified: Secondary | ICD-10-CM | POA: Diagnosis not present

## 2021-03-29 DIAGNOSIS — L89154 Pressure ulcer of sacral region, stage 4: Secondary | ICD-10-CM | POA: Diagnosis not present

## 2021-03-29 DIAGNOSIS — G8220 Paraplegia, unspecified: Secondary | ICD-10-CM | POA: Insufficient documentation

## 2021-03-29 DIAGNOSIS — D649 Anemia, unspecified: Secondary | ICD-10-CM | POA: Diagnosis not present

## 2021-03-29 DIAGNOSIS — Z01812 Encounter for preprocedural laboratory examination: Secondary | ICD-10-CM | POA: Diagnosis not present

## 2021-03-29 DIAGNOSIS — Z01818 Encounter for other preprocedural examination: Secondary | ICD-10-CM

## 2021-03-29 HISTORY — DX: Essential (primary) hypertension: I10

## 2021-03-29 LAB — BASIC METABOLIC PANEL
Anion gap: 8 (ref 5–15)
BUN: 6 mg/dL (ref 6–20)
CO2: 30 mmol/L (ref 22–32)
Calcium: 9.2 mg/dL (ref 8.9–10.3)
Chloride: 100 mmol/L (ref 98–111)
Creatinine, Ser: 0.6 mg/dL (ref 0.44–1.00)
GFR, Estimated: >60 mL/min (ref >60)
Glucose, Bld: 119 mg/dL — ABNORMAL HIGH (ref 70–99)
Potassium: 3.6 mmol/L (ref 3.5–5.1)
Sodium: 138 mmol/L (ref 135–145)

## 2021-03-29 LAB — CBC
HCT: 38.4 % (ref 36.0–46.0)
Hemoglobin: 11.7 g/dL — ABNORMAL LOW (ref 12.0–15.0)
MCH: 26.9 pg (ref 26.0–34.0)
MCHC: 30.5 g/dL (ref 30.0–36.0)
MCV: 88.3 fL (ref 80.0–100.0)
Platelets: 418 10*3/uL — ABNORMAL HIGH (ref 150–400)
RBC: 4.35 MIL/uL (ref 3.87–5.11)
RDW: 16.2 % — ABNORMAL HIGH (ref 11.5–15.5)
WBC: 11.8 10*3/uL — ABNORMAL HIGH (ref 4.0–10.5)
nRBC: 0 % (ref 0.0–0.2)

## 2021-03-29 LAB — SURGICAL PCR SCREEN
MRSA, PCR: NEGATIVE
Staphylococcus aureus: NEGATIVE

## 2021-03-29 NOTE — Progress Notes (Signed)
PCP - [name], [place] Cardiologist - Denies  PPM/ICD - Denies  Chest x-ray - N/A EKG - 05/31/20 Stress Test - Denies ECHO - Denies Cardiac Cath - Denies  Sleep Study - Denies  Patient denies having diabetes.  Blood Thinner Instructions: N/A Aspirin Instructions: N/A  ERAS Protcol - Yes, PRE-SURGERY Ensure  COVID TEST- Patient states she had a positive covid test 02/27/21.  Patient is paraplegic and has a home Therapist, sports.  Anesthesia review: Yes, previous EKG abnormal  Patient denies shortness of breath, fever, cough and chest pain at PAT appointment   All instructions explained to the patient, with a verbal understanding of the material. Patient agrees to go over the instructions while at home for a better understanding. Patient also instructed to self quarantine after being tested for COVID-19. The opportunity to ask questions was provided.
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
## 2021-04-02 ENCOUNTER — Other Ambulatory Visit: Payer: Self-pay

## 2021-04-02 ENCOUNTER — Encounter (HOSPITAL_COMMUNITY)
Admission: RE | Disposition: A | Discharge status: Home / Self Care | Payer: Self-pay | Source: Home / Self Care | Attending: Orthopedic Surgery

## 2021-04-02 ENCOUNTER — Encounter (HOSPITAL_COMMUNITY): Payer: Self-pay | Admitting: Orthopedic Surgery

## 2021-04-02 ENCOUNTER — Ambulatory Visit (HOSPITAL_COMMUNITY): Payer: Medicaid Other | Admitting: Vascular Surgery

## 2021-04-02 ENCOUNTER — Inpatient Hospital Stay (HOSPITAL_COMMUNITY)
Admission: RE | Admit: 2021-04-02 | Discharge: 2021-04-05 | DRG: 500 | Disposition: A | Discharge status: Home / Self Care | Payer: Medicaid Other | Attending: Orthopedic Surgery | Admitting: Orthopedic Surgery

## 2021-04-02 ENCOUNTER — Observation Stay (HOSPITAL_COMMUNITY): Payer: Medicaid Other

## 2021-04-02 ENCOUNTER — Ambulatory Visit (HOSPITAL_COMMUNITY): Payer: Medicaid Other | Admitting: Certified Registered"

## 2021-04-02 DIAGNOSIS — F419 Anxiety disorder, unspecified: Secondary | ICD-10-CM | POA: Diagnosis present

## 2021-04-02 DIAGNOSIS — Z9889 Other specified postprocedural states: Secondary | ICD-10-CM

## 2021-04-02 DIAGNOSIS — R42 Dizziness and giddiness: Secondary | ICD-10-CM | POA: Diagnosis not present

## 2021-04-02 DIAGNOSIS — Z9049 Acquired absence of other specified parts of digestive tract: Secondary | ICD-10-CM

## 2021-04-02 DIAGNOSIS — Z8249 Family history of ischemic heart disease and other diseases of the circulatory system: Secondary | ICD-10-CM

## 2021-04-02 DIAGNOSIS — E039 Hypothyroidism, unspecified: Secondary | ICD-10-CM | POA: Diagnosis present

## 2021-04-02 DIAGNOSIS — Z87891 Personal history of nicotine dependence: Secondary | ICD-10-CM

## 2021-04-02 DIAGNOSIS — Z800 Family history of malignant neoplasm of digestive organs: Secondary | ICD-10-CM

## 2021-04-02 DIAGNOSIS — M25561 Pain in right knee: Secondary | ICD-10-CM

## 2021-04-02 DIAGNOSIS — F32A Depression, unspecified: Secondary | ICD-10-CM | POA: Diagnosis present

## 2021-04-02 DIAGNOSIS — N312 Flaccid neuropathic bladder, not elsewhere classified: Secondary | ICD-10-CM | POA: Diagnosis present

## 2021-04-02 DIAGNOSIS — I10 Essential (primary) hypertension: Secondary | ICD-10-CM | POA: Diagnosis present

## 2021-04-02 DIAGNOSIS — Z823 Family history of stroke: Secondary | ICD-10-CM

## 2021-04-02 DIAGNOSIS — Z8349 Family history of other endocrine, nutritional and metabolic diseases: Secondary | ICD-10-CM

## 2021-04-02 DIAGNOSIS — J449 Chronic obstructive pulmonary disease, unspecified: Secondary | ICD-10-CM | POA: Diagnosis present

## 2021-04-02 DIAGNOSIS — S34101S Unspecified injury to L1 level of lumbar spinal cord, sequela: Secondary | ICD-10-CM

## 2021-04-02 DIAGNOSIS — Z833 Family history of diabetes mellitus: Secondary | ICD-10-CM

## 2021-04-02 DIAGNOSIS — M24661 Ankylosis, right knee: Secondary | ICD-10-CM

## 2021-04-02 DIAGNOSIS — K592 Neurogenic bowel, not elsewhere classified: Secondary | ICD-10-CM | POA: Diagnosis present

## 2021-04-02 DIAGNOSIS — M21271 Flexion deformity, right ankle and toes: Secondary | ICD-10-CM | POA: Diagnosis present

## 2021-04-02 DIAGNOSIS — K219 Gastro-esophageal reflux disease without esophagitis: Secondary | ICD-10-CM | POA: Diagnosis present

## 2021-04-02 DIAGNOSIS — Z6841 Body Mass Index (BMI) 40.0 and over, adult: Secondary | ICD-10-CM

## 2021-04-02 DIAGNOSIS — M24561 Contracture, right knee: Principal | ICD-10-CM | POA: Diagnosis present

## 2021-04-02 DIAGNOSIS — Z79890 Hormone replacement therapy (postmenopausal): Secondary | ICD-10-CM

## 2021-04-02 DIAGNOSIS — Z79899 Other long term (current) drug therapy: Secondary | ICD-10-CM

## 2021-04-02 DIAGNOSIS — M85861 Other specified disorders of bone density and structure, right lower leg: Secondary | ICD-10-CM | POA: Diagnosis present

## 2021-04-02 DIAGNOSIS — Z8049 Family history of malignant neoplasm of other genital organs: Secondary | ICD-10-CM

## 2021-04-02 DIAGNOSIS — G8220 Paraplegia, unspecified: Secondary | ICD-10-CM | POA: Diagnosis present

## 2021-04-02 DIAGNOSIS — M21272 Flexion deformity, left ankle and toes: Secondary | ICD-10-CM | POA: Diagnosis present

## 2021-04-02 DIAGNOSIS — L89154 Pressure ulcer of sacral region, stage 4: Secondary | ICD-10-CM | POA: Diagnosis present

## 2021-04-02 DIAGNOSIS — Z01818 Encounter for other preprocedural examination: Secondary | ICD-10-CM

## 2021-04-02 DIAGNOSIS — M79659 Pain in unspecified thigh: Secondary | ICD-10-CM

## 2021-04-02 DIAGNOSIS — M25861 Other specified joint disorders, right knee: Secondary | ICD-10-CM | POA: Diagnosis present

## 2021-04-02 HISTORY — PX: APPLICATION OF WOUND VAC: SHX5189

## 2021-04-02 HISTORY — PX: REPAIR QUADRICEPS/HAMSTRING MUSCLES: SHX6563

## 2021-04-02 LAB — TYPE AND SCREEN
ABO/RH(D): A POS
Antibody Screen: NEGATIVE

## 2021-04-02 LAB — POCT PREGNANCY, URINE: Preg Test, Ur: NEGATIVE

## 2021-04-02 SURGERY — REPAIR, MUSCLE, QUADRICEPS OR HAMSTRING
Anesthesia: General | Site: Leg Upper | Laterality: Right

## 2021-04-02 MED ORDER — ACETAMINOPHEN 500 MG PO TABS
1000.0000 mg | ORAL_TABLET | Freq: Four times a day (QID) | ORAL | Status: AC
  Administered 2021-04-02 – 2021-04-03 (×4): 1000 mg via ORAL
  Filled 2021-04-02 (×4): qty 2

## 2021-04-02 MED ORDER — METOCLOPRAMIDE HCL 5 MG/ML IJ SOLN: 5.0000 mg | Freq: Three times a day (TID) | INTRAMUSCULAR | Status: DC | PRN

## 2021-04-02 MED ORDER — CLONIDINE HCL (ANALGESIA) 100 MCG/ML EP SOLN
EPIDURAL | Status: AC
  Filled 2021-04-02: qty 10

## 2021-04-02 MED ORDER — FENTANYL CITRATE (PF) 100 MCG/2ML IJ SOLN
INTRAMUSCULAR | Status: DC | PRN
  Administered 2021-04-02: 50 ug via INTRAVENOUS

## 2021-04-02 MED ORDER — CEFAZOLIN IN SODIUM CHLORIDE 3-0.9 GM/100ML-% IV SOLN
3.0000 g | INTRAVENOUS | Status: AC
  Administered 2021-04-02: 3 g via INTRAVENOUS
  Filled 2021-04-02: qty 100

## 2021-04-02 MED ORDER — LEVOTHYROXINE SODIUM 88 MCG PO TABS
88.0000 ug | ORAL_TABLET | Freq: Every day | ORAL | Status: DC
  Administered 2021-04-03 – 2021-04-05 (×3): 88 ug via ORAL
  Filled 2021-04-02 (×3): qty 1

## 2021-04-02 MED ORDER — VANCOMYCIN HCL 1000 MG IV SOLR
INTRAVENOUS | Status: AC
  Filled 2021-04-02: qty 20

## 2021-04-02 MED ORDER — ONDANSETRON HCL 4 MG/2ML IJ SOLN: 4.0000 mg | Freq: Four times a day (QID) | INTRAMUSCULAR | Status: DC | PRN

## 2021-04-02 MED ORDER — SUGAMMADEX SODIUM 200 MG/2ML IV SOLN
INTRAVENOUS | Status: DC | PRN
  Administered 2021-04-02: 200 mg via INTRAVENOUS

## 2021-04-02 MED ORDER — PROPOFOL 10 MG/ML IV BOLUS
INTRAVENOUS | Status: AC
  Filled 2021-04-02: qty 20

## 2021-04-02 MED ORDER — FENTANYL CITRATE (PF) 100 MCG/2ML IJ SOLN
INTRAMUSCULAR | Status: AC
  Administered 2021-04-02: 100 ug via INTRAVENOUS
  Filled 2021-04-02: qty 2

## 2021-04-02 MED ORDER — 0.9 % SODIUM CHLORIDE (POUR BTL) OPTIME
TOPICAL | Status: DC | PRN
  Administered 2021-04-02 (×2): 1000 mL

## 2021-04-02 MED ORDER — CLONIDINE HCL (ANALGESIA) 100 MCG/ML EP SOLN
EPIDURAL | Status: DC | PRN
  Administered 2021-04-02: 1 mL via INTRA_ARTICULAR

## 2021-04-02 MED ORDER — BUPROPION HCL ER (SR) 150 MG PO TB12
150.0000 mg | ORAL_TABLET | Freq: Two times a day (BID) | ORAL | Status: DC
  Administered 2021-04-02 – 2021-04-05 (×6): 150 mg via ORAL
  Filled 2021-04-02 (×7): qty 1

## 2021-04-02 MED ORDER — ORAL CARE MOUTH RINSE: 15.0000 mL | Freq: Once | OROMUCOSAL | Status: AC

## 2021-04-02 MED ORDER — ONDANSETRON HCL 4 MG/2ML IJ SOLN
INTRAMUSCULAR | Status: AC
  Filled 2021-04-02: qty 4

## 2021-04-02 MED ORDER — POVIDONE-IODINE 7.5 % EX SOLN
Freq: Once | CUTANEOUS | Status: DC
  Filled 2021-04-02: qty 118

## 2021-04-02 MED ORDER — PROPOFOL 10 MG/ML IV BOLUS
INTRAVENOUS | Status: DC | PRN
  Administered 2021-04-02: 200 mg via INTRAVENOUS

## 2021-04-02 MED ORDER — MORPHINE SULFATE (PF) 4 MG/ML IV SOLN
INTRAVENOUS | Status: DC | PRN
  Administered 2021-04-02: 8 mg via INTRAMUSCULAR

## 2021-04-02 MED ORDER — BUPIVACAINE LIPOSOME 1.3 % IJ SUSP
INTRAMUSCULAR | Status: DC | PRN
Start: 2021-04-02 — End: 2021-04-02
  Administered 2021-04-02: 10 mL via PERINEURAL

## 2021-04-02 MED ORDER — MORPHINE SULFATE (PF) 4 MG/ML IV SOLN
INTRAVENOUS | Status: AC
  Filled 2021-04-02: qty 1

## 2021-04-02 MED ORDER — SULFAMETHOXAZOLE-TRIMETHOPRIM 400-80 MG PO TABS
1.0000 | ORAL_TABLET | Freq: Every day | ORAL | Status: DC
  Administered 2021-04-02 – 2021-04-05 (×4): 1 via ORAL
  Filled 2021-04-02 (×4): qty 1

## 2021-04-02 MED ORDER — ONDANSETRON HCL 4 MG PO TABS: 4.0000 mg | ORAL_TABLET | Freq: Four times a day (QID) | ORAL | Status: DC | PRN

## 2021-04-02 MED ORDER — ROCURONIUM BROMIDE 10 MG/ML (PF) SYRINGE
PREFILLED_SYRINGE | INTRAVENOUS | Status: DC | PRN
  Administered 2021-04-02: 60 mg via INTRAVENOUS
  Administered 2021-04-02 (×3): 20 mg via INTRAVENOUS

## 2021-04-02 MED ORDER — ONDANSETRON HCL 4 MG/2ML IJ SOLN
INTRAMUSCULAR | Status: DC | PRN
  Administered 2021-04-02: 4 mg via INTRAVENOUS

## 2021-04-02 MED ORDER — BUPIVACAINE HCL (PF) 0.25 % IJ SOLN
INTRAMUSCULAR | Status: DC | PRN
  Administered 2021-04-02: 30 mL

## 2021-04-02 MED ORDER — METHOCARBAMOL 500 MG PO TABS
500.0000 mg | ORAL_TABLET | Freq: Four times a day (QID) | ORAL | Status: DC | PRN
  Administered 2021-04-04: 500 mg via ORAL
  Filled 2021-04-02: qty 1

## 2021-04-02 MED ORDER — OXYCODONE HCL 5 MG PO TABS: 5.0000 mg | ORAL_TABLET | Freq: Once | ORAL | Status: DC | PRN

## 2021-04-02 MED ORDER — BUPIVACAINE-EPINEPHRINE (PF) 0.5% -1:200000 IJ SOLN
INTRAMUSCULAR | Status: DC | PRN
Start: 2021-04-02 — End: 2021-04-02
  Administered 2021-04-02: 15 mL via PERINEURAL

## 2021-04-02 MED ORDER — CHLORHEXIDINE GLUCONATE 0.12 % MT SOLN
OROMUCOSAL | Status: AC
  Administered 2021-04-02: 15 mL via OROMUCOSAL
  Filled 2021-04-02: qty 15

## 2021-04-02 MED ORDER — FENTANYL CITRATE (PF) 100 MCG/2ML IJ SOLN
INTRAMUSCULAR | Status: AC
  Administered 2021-04-02: 50 ug via INTRAVENOUS
  Filled 2021-04-02: qty 2

## 2021-04-02 MED ORDER — HYDROMORPHONE HCL 1 MG/ML IJ SOLN
0.5000 mg | INTRAMUSCULAR | Status: DC | PRN
  Administered 2021-04-04: 0.5 mg via INTRAVENOUS
  Filled 2021-04-02: qty 0.5

## 2021-04-02 MED ORDER — MIDAZOLAM HCL 2 MG/2ML IJ SOLN
INTRAMUSCULAR | Status: AC
  Filled 2021-04-02: qty 2

## 2021-04-02 MED ORDER — PHENYLEPHRINE 40 MCG/ML (10ML) SYRINGE FOR IV PUSH (FOR BLOOD PRESSURE SUPPORT)
PREFILLED_SYRINGE | INTRAVENOUS | Status: DC | PRN
  Administered 2021-04-02: 160 ug via INTRAVENOUS
  Administered 2021-04-02 (×2): 120 ug via INTRAVENOUS

## 2021-04-02 MED ORDER — OXYCODONE HCL 5 MG PO TABS
5.0000 mg | ORAL_TABLET | ORAL | Status: DC | PRN
  Administered 2021-04-02 – 2021-04-03 (×2): 10 mg via ORAL
  Filled 2021-04-02 (×2): qty 2

## 2021-04-02 MED ORDER — MIDAZOLAM HCL 2 MG/2ML IJ SOLN: 2.0000 mg | Freq: Once | INTRAMUSCULAR | Status: AC

## 2021-04-02 MED ORDER — ACETAMINOPHEN 325 MG PO TABS: 325.0000 mg | ORAL_TABLET | Freq: Four times a day (QID) | ORAL | Status: DC | PRN

## 2021-04-02 MED ORDER — SCOPOLAMINE 1 MG/3DAYS TD PT72
MEDICATED_PATCH | TRANSDERMAL | Status: DC | PRN
  Administered 2021-04-02: 1 via TRANSDERMAL

## 2021-04-02 MED ORDER — FENTANYL CITRATE (PF) 100 MCG/2ML IJ SOLN
25.0000 ug | INTRAMUSCULAR | Status: DC | PRN
  Administered 2021-04-02 (×2): 25 ug via INTRAVENOUS

## 2021-04-02 MED ORDER — LIDOCAINE 2% (20 MG/ML) 5 ML SYRINGE
INTRAMUSCULAR | Status: AC
  Filled 2021-04-02: qty 10

## 2021-04-02 MED ORDER — LIDOCAINE 2% (20 MG/ML) 5 ML SYRINGE
INTRAMUSCULAR | Status: DC | PRN
  Administered 2021-04-02: 40 mg via INTRAVENOUS

## 2021-04-02 MED ORDER — FENTANYL CITRATE (PF) 100 MCG/2ML IJ SOLN: 100.0000 ug | Freq: Once | INTRAMUSCULAR | Status: AC

## 2021-04-02 MED ORDER — CITALOPRAM HYDROBROMIDE 40 MG PO TABS
40.0000 mg | ORAL_TABLET | Freq: Every day | ORAL | Status: DC
  Administered 2021-04-03 – 2021-04-05 (×3): 40 mg via ORAL
  Filled 2021-04-02 (×3): qty 1

## 2021-04-02 MED ORDER — BUPIVACAINE HCL (PF) 0.25 % IJ SOLN
INTRAMUSCULAR | Status: AC
  Filled 2021-04-02: qty 30

## 2021-04-02 MED ORDER — OXYBUTYNIN CHLORIDE ER 5 MG PO TB24
5.0000 mg | ORAL_TABLET | Freq: Every day | ORAL | Status: DC
  Administered 2021-04-02 – 2021-04-05 (×4): 5 mg via ORAL
  Filled 2021-04-02 (×4): qty 1

## 2021-04-02 MED ORDER — METOCLOPRAMIDE HCL 5 MG PO TABS: 5.0000 mg | ORAL_TABLET | Freq: Three times a day (TID) | ORAL | Status: DC | PRN

## 2021-04-02 MED ORDER — ROCURONIUM BROMIDE 10 MG/ML (PF) SYRINGE
PREFILLED_SYRINGE | INTRAVENOUS | Status: AC
  Filled 2021-04-02: qty 10

## 2021-04-02 MED ORDER — LOSARTAN POTASSIUM 50 MG PO TABS
100.0000 mg | ORAL_TABLET | Freq: Every day | ORAL | Status: DC
  Administered 2021-04-03 – 2021-04-05 (×3): 100 mg via ORAL
  Filled 2021-04-02 (×3): qty 2

## 2021-04-02 MED ORDER — ONDANSETRON HCL 4 MG/2ML IJ SOLN
INTRAMUSCULAR | Status: AC
  Filled 2021-04-02: qty 2

## 2021-04-02 MED ORDER — GABAPENTIN 600 MG PO TABS
600.0000 mg | ORAL_TABLET | Freq: Three times a day (TID) | ORAL | Status: DC
  Administered 2021-04-02 – 2021-04-05 (×9): 600 mg via ORAL
  Filled 2021-04-02 (×9): qty 1

## 2021-04-02 MED ORDER — DEXAMETHASONE SODIUM PHOSPHATE 10 MG/ML IJ SOLN
INTRAMUSCULAR | Status: AC
  Filled 2021-04-02: qty 1

## 2021-04-02 MED ORDER — LACTATED RINGERS IV SOLN: INTRAVENOUS | Status: DC

## 2021-04-02 MED ORDER — MIDAZOLAM HCL 2 MG/2ML IJ SOLN
INTRAMUSCULAR | Status: AC
  Administered 2021-04-02: 2 mg via INTRAVENOUS
  Filled 2021-04-02: qty 2

## 2021-04-02 MED ORDER — POVIDONE-IODINE 10 % EX SWAB
2.0000 | Freq: Once | CUTANEOUS | Status: AC
Start: 2021-04-02 — End: 2021-04-02
  Administered 2021-04-02: 2 via TOPICAL

## 2021-04-02 MED ORDER — DOCUSATE SODIUM 100 MG PO CAPS
100.0000 mg | ORAL_CAPSULE | Freq: Two times a day (BID) | ORAL | Status: DC
  Administered 2021-04-02 – 2021-04-05 (×6): 100 mg via ORAL
  Filled 2021-04-02 (×6): qty 1

## 2021-04-02 MED ORDER — CEFAZOLIN SODIUM-DEXTROSE 2-4 GM/100ML-% IV SOLN
2.0000 g | Freq: Three times a day (TID) | INTRAVENOUS | Status: AC
  Administered 2021-04-02 – 2021-04-03 (×3): 2 g via INTRAVENOUS
  Filled 2021-04-02 (×3): qty 100

## 2021-04-02 MED ORDER — PANTOPRAZOLE SODIUM 40 MG PO TBEC
40.0000 mg | DELAYED_RELEASE_TABLET | Freq: Every day | ORAL | Status: DC
  Administered 2021-04-02 – 2021-04-05 (×4): 40 mg via ORAL
  Filled 2021-04-02 (×4): qty 1

## 2021-04-02 MED ORDER — METHOCARBAMOL 1000 MG/10ML IJ SOLN
500.0000 mg | Freq: Four times a day (QID) | INTRAVENOUS | Status: DC | PRN
  Filled 2021-04-02: qty 5

## 2021-04-02 MED ORDER — CHLORHEXIDINE GLUCONATE 0.12 % MT SOLN: 15.0000 mL | Freq: Once | OROMUCOSAL | Status: AC

## 2021-04-02 MED ORDER — PHENYLEPHRINE 40 MCG/ML (10ML) SYRINGE FOR IV PUSH (FOR BLOOD PRESSURE SUPPORT)
PREFILLED_SYRINGE | INTRAVENOUS | Status: AC
  Filled 2021-04-02: qty 10

## 2021-04-02 MED ORDER — FENTANYL CITRATE (PF) 250 MCG/5ML IJ SOLN
INTRAMUSCULAR | Status: AC
  Filled 2021-04-02: qty 5

## 2021-04-02 MED ORDER — ONDANSETRON HCL 4 MG/2ML IJ SOLN
4.0000 mg | Freq: Four times a day (QID) | INTRAMUSCULAR | Status: AC | PRN
  Administered 2021-04-02: 4 mg via INTRAVENOUS

## 2021-04-02 MED ORDER — ASPIRIN 81 MG PO CHEW
81.0000 mg | CHEWABLE_TABLET | Freq: Two times a day (BID) | ORAL | Status: DC
  Administered 2021-04-02 – 2021-04-05 (×6): 81 mg via ORAL
  Filled 2021-04-02 (×6): qty 1

## 2021-04-02 MED ORDER — OXYCODONE HCL 5 MG/5ML PO SOLN: 5.0000 mg | Freq: Once | ORAL | Status: DC | PRN

## 2021-04-02 MED ORDER — DEXAMETHASONE SODIUM PHOSPHATE 10 MG/ML IJ SOLN
INTRAMUSCULAR | Status: DC | PRN
  Administered 2021-04-02: 10 mg via INTRAVENOUS

## 2021-04-02 MED ORDER — TRANEXAMIC ACID-NACL 1000-0.7 MG/100ML-% IV SOLN
1000.0000 mg | INTRAVENOUS | Status: AC
  Administered 2021-04-02: 1000 mg via INTRAVENOUS
  Filled 2021-04-02: qty 100

## 2021-04-02 MED ORDER — POVIDONE-IODINE 10 % EX SWAB: 2.0000 "application " | Freq: Once | CUTANEOUS | Status: DC

## 2021-04-02 MED ORDER — BUSPIRONE HCL 15 MG PO TABS
15.0000 mg | ORAL_TABLET | Freq: Two times a day (BID) | ORAL | Status: DC
  Administered 2021-04-02 – 2021-04-05 (×6): 15 mg via ORAL
  Filled 2021-04-02 (×6): qty 1

## 2021-04-02 MED ORDER — PHENYLEPHRINE HCL-NACL 20-0.9 MG/250ML-% IV SOLN
INTRAVENOUS | Status: DC | PRN
  Administered 2021-04-02: 25 ug/min via INTRAVENOUS

## 2021-04-02 SURGICAL SUPPLY — 60 items
BAG COUNTER SPONGE SURGICOUNT (BAG) ×3 IMPLANT
BENZOIN TINCTURE PRP APPL 2/3 (GAUZE/BANDAGES/DRESSINGS) ×2 IMPLANT
BIT DRILL 5/64X5 DISP (BIT) ×3 IMPLANT
BLADE SURG 10 STRL SS (BLADE) ×3 IMPLANT
BNDG ELASTIC 6X15 VLCR STRL LF (GAUZE/BANDAGES/DRESSINGS) ×1 IMPLANT
CANISTER WOUNDNEG PRESSURE 500 (CANNISTER) ×1 IMPLANT
CONNECTOR Y ATS VAC SYSTEM (MISCELLANEOUS) ×1 IMPLANT
COVER MAYO STAND STRL (DRAPES) ×3 IMPLANT
COVER SURGICAL LIGHT HANDLE (MISCELLANEOUS) ×4 IMPLANT
CUFF TOURN SGL QUICK 34 (TOURNIQUET CUFF) ×3
CUFF TOURN SGL QUICK 42 (TOURNIQUET CUFF) IMPLANT
CUFF TRNQT CYL 34X4.125X (TOURNIQUET CUFF) IMPLANT
DRAPE INCISE IOBAN 66X45 STRL (DRAPES) ×3 IMPLANT
DRAPE U-SHAPE 47X51 STRL (DRAPES) ×3 IMPLANT
DRESSING PREVENA PLUS CUSTOM (GAUZE/BANDAGES/DRESSINGS) IMPLANT
DRSG PREVENA PLUS CUSTOM (GAUZE/BANDAGES/DRESSINGS) ×3
DURAPREP 26ML APPLICATOR (WOUND CARE) ×4 IMPLANT
ELECT BLADE 4.0 EZ CLEAN MEGAD (MISCELLANEOUS) ×3
ELECT REM PT RETURN 9FT ADLT (ELECTROSURGICAL) ×3
ELECTRODE BLDE 4.0 EZ CLN MEGD (MISCELLANEOUS) IMPLANT
ELECTRODE REM PT RTRN 9FT ADLT (ELECTROSURGICAL) ×2 IMPLANT
GLOVE SRG 8 PF TXTR STRL LF DI (GLOVE) ×2 IMPLANT
GLOVE SURG LTX SZ8 (GLOVE) ×3 IMPLANT
GLOVE SURG UNDER POLY LF SZ8 (GLOVE) ×3
GOWN STRL REUS W/ TWL LRG LVL3 (GOWN DISPOSABLE) ×2 IMPLANT
GOWN STRL REUS W/ TWL XL LVL3 (GOWN DISPOSABLE) ×2 IMPLANT
GOWN STRL REUS W/TWL LRG LVL3 (GOWN DISPOSABLE) ×3
GOWN STRL REUS W/TWL XL LVL3 (GOWN DISPOSABLE) ×3
HANDPIECE INTERPULSE COAX TIP (DISPOSABLE) ×3
KIT BASIN OR (CUSTOM PROCEDURE TRAY) ×3 IMPLANT
KIT DRSG PREVENA PLUS 7DAY 125 (MISCELLANEOUS) ×1 IMPLANT
KIT TURNOVER KIT B (KITS) ×3 IMPLANT
MANIFOLD NEPTUNE II (INSTRUMENTS) ×3 IMPLANT
NDL 18GX1X1/2 (RX/OR ONLY) (NEEDLE) ×2 IMPLANT
NDL SPNL 18GX3.5 QUINCKE PK (NEEDLE) ×2 IMPLANT
NEEDLE 18GX1X1/2 (RX/OR ONLY) (NEEDLE) ×3 IMPLANT
NEEDLE SPNL 18GX3.5 QUINCKE PK (NEEDLE) ×3 IMPLANT
NS IRRIG 1000ML POUR BTL (IV SOLUTION) ×3 IMPLANT
PACK ORTHO EXTREMITY (CUSTOM PROCEDURE TRAY) ×3 IMPLANT
PAD ARMBOARD 7.5X6 YLW CONV (MISCELLANEOUS) ×6 IMPLANT
PAD NEG PRESSURE SENSATRAC (MISCELLANEOUS) ×1 IMPLANT
PADDING CAST COTTON 6X4 STRL (CAST SUPPLIES) ×2 IMPLANT
PASSER SUT SWANSON 36MM LOOP (INSTRUMENTS) ×3 IMPLANT
PENCIL BUTTON HOLSTER BLD 10FT (ELECTRODE) ×3 IMPLANT
SET HNDPC FAN SPRY TIP SCT (DISPOSABLE) IMPLANT
SPONGE T-LAP 18X18 ~~LOC~~+RFID (SPONGE) ×8 IMPLANT
STAPLER VISISTAT 35W (STAPLE) ×3 IMPLANT
SUT VIC AB 0 CT1 27 (SUTURE) ×12
SUT VIC AB 0 CT1 27XBRD ANBCTR (SUTURE) IMPLANT
SUT VIC AB 1 CT1 27 (SUTURE) ×27
SUT VIC AB 1 CT1 27XBRD ANBCTR (SUTURE) IMPLANT
SUT VIC AB 2-0 CT1 27 (SUTURE) ×39
SUT VIC AB 2-0 CT1 TAPERPNT 27 (SUTURE) IMPLANT
SUT VICRYL AB 2 0 TIES (SUTURE) ×1 IMPLANT
SYR 50ML LL SCALE MARK (SYRINGE) ×1 IMPLANT
SYR CONTROL 10ML LL (SYRINGE) ×3 IMPLANT
SYR TB 1ML LUER SLIP (SYRINGE) ×3 IMPLANT
TOWEL GREEN STERILE (TOWEL DISPOSABLE) ×3 IMPLANT
TUBE CONNECTING 12X1/4 (SUCTIONS) ×3 IMPLANT
YANKAUER SUCT BULB TIP NO VENT (SUCTIONS) ×3 IMPLANT

## 2021-04-02 NOTE — Anesthesia Procedure Notes (Signed)
Procedure Name: Intubation Date/Time: 04/02/2021 11:32 AM Performed by: Barrington Ellison, CRNA Pre-anesthesia Checklist: Patient identified, Emergency Drugs available, Suction available and Patient being monitored Patient Re-evaluated:Patient Re-evaluated prior to induction Oxygen Delivery Method: Circle System Utilized Preoxygenation: Pre-oxygenation with 100% oxygen Induction Type: IV induction Ventilation: Mask ventilation without difficulty Laryngoscope Size: Mac and 3 Grade View: Grade I Tube type: Oral Tube size: 7.0 mm Number of attempts: 1 Airway Equipment and Method: Stylet and Oral airway Placement Confirmation: ETT inserted through vocal cords under direct vision, positive ETCO2 and breath sounds checked- equal and bilateral Secured at: 21 cm Tube secured with: Tape Dental Injury: Teeth and Oropharynx as per pre-operative assessment

## 2021-04-02 NOTE — Anesthesia Procedure Notes (Signed)
Anesthesia Regional Block: Femoral nerve block   Pre-Anesthetic Checklist: , timeout performed,  Correct Patient, Correct Site, Correct Laterality,  Correct Procedure, Correct Position, site marked,  Risks and benefits discussed,  Surgical consent,  Pre-op evaluation,  At surgeon's request and post-op pain management  Laterality: Right and Lower  Prep: chloraprep       Needles:  Injection technique: Single-shot  Needle Type: Echogenic Needle     Needle Length: 9cm  Needle Gauge: 21     Additional Needles:   Procedures:,,,, ultrasound used (permanent image in chart),,    Narrative:  Start time: 04/02/2021 10:52 AM End time: 04/02/2021 10:08 AM Injection made incrementally with aspirations every 5 mL.  Performed by: Personally  Anesthesiologist: Annye Asa, MD  Additional Notes: Pt identified in Holding room.  Monitors applied. Working IV access confirmed. Sterile prep R groin.  #21ga ECHOgenic Arrow block needle into femoral canal with US guidance.  15cc 0.5% Bupivacaine 1:200k epi, Exparel injected incrementally after negative test dose.  Patient asymptomatic, VSS, no heme aspirated, tolerated well.  Glennon Mac, MD

## 2021-04-02 NOTE — Progress Notes (Signed)
Orthopedic Tech Progress Note Patient Details:  [name] 07-Jan-1981 [id number]  Applied CPM to patient, I have some concern of patient legs/thighs getting pinched, so I placed a pillow in between legs/CPM. Also told CHARGE as well as RN  CPM Right Knee Right Knee Flexion (Degrees): 0 Right Knee Extension (Degrees): 60  Post Interventions Patient Tolerated: Well Instructions Provided: Care of device  [name] 04/02/2021, 3:31 PM

## 2021-04-02 NOTE — Brief Op Note (Signed)
° °  04/02/2021  2:40 PM  PATIENT:  [name]  41 y.o. female  PRE-OPERATIVE DIAGNOSIS:  RIGHT KNEE extension CONSTRACTURE  POST-OPERATIVE DIAGNOSIS:  RIGHT KNEE extension CONSTRACTURE  PROCEDURE:  Procedure(s): RIGHT LEG JUDET QUADRICEPSPLASTY APPLICATION OF WOUND VAC  SURGEON:  Surgeon(s): Marlou Sa, Tonna Corner, MD  ASSISTANT: magnant pa  ANESTHESIA:   general  EBL: 150 ml    Total I/O In: 1500 [I.V.:1500] Out: 1050 [Urine:800; Blood:250]  BLOOD ADMINISTERED: none  DRAINS:  provena wound vac x 2     LOCAL MEDICATIONS USED:  vanco  SPECIMEN:  No Specimen  COUNTS:  YES  TOURNIQUET:   Total Tourniquet Time Documented: Thigh (Right) - 23 minutes Total: Thigh (Right) - 23 minutes   DICTATION: .Other Dictation: Dictation Number [id number]  PLAN OF CARE: Admit to inpatient   PATIENT DISPOSITION:  PACU - hemodynamically stable
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
## 2021-04-02 NOTE — Transfer of Care (Signed)
Immediate Anesthesia Transfer of Care Note  Patient: [name]  Procedure(s) Performed: RIGHT LEG JUDET QUADRICEPSPLASTY (Right) APPLICATION OF WOUND VAC (Right: Leg Upper)  Patient Location: PACU  Anesthesia Type:General  Level of Consciousness: awake, alert  and oriented  Airway & Oxygen Therapy: Patient Spontanous Breathing  Post-op Assessment: Report given to RN  Post vital signs: Reviewed and stable  Last Vitals:  Vitals Value Taken Time  BP 119/71 04/02/21 1444  Temp    Pulse 82 04/02/21 1445  Resp 15 04/02/21 1445  SpO2 96 % 04/02/21 1445  Vitals shown include unvalidated device data.  Last Pain:  Vitals:   04/02/21 1004  TempSrc:   PainSc: 5          Complications: No notable events documented.

## 2021-04-03 ENCOUNTER — Encounter (HOSPITAL_COMMUNITY): Payer: Self-pay | Admitting: Orthopedic Surgery

## 2021-04-03 ENCOUNTER — Telehealth: Payer: Self-pay | Admitting: Family Medicine

## 2021-04-03 ENCOUNTER — Encounter (HOSPITAL_BASED_OUTPATIENT_CLINIC_OR_DEPARTMENT_OTHER): Payer: Medicaid Other | Admitting: Internal Medicine

## 2021-04-03 LAB — CBC
HCT: 30 % — ABNORMAL LOW (ref 36.0–46.0)
Hemoglobin: 9.4 g/dL — ABNORMAL LOW (ref 12.0–15.0)
MCH: 27.5 pg (ref 26.0–34.0)
MCHC: 31.3 g/dL (ref 30.0–36.0)
MCV: 87.7 fL (ref 80.0–100.0)
Platelets: 405 10*3/uL — ABNORMAL HIGH (ref 150–400)
RBC: 3.42 MIL/uL — ABNORMAL LOW (ref 3.87–5.11)
RDW: 16.4 % — ABNORMAL HIGH (ref 11.5–15.5)
WBC: 14.2 10*3/uL — ABNORMAL HIGH (ref 4.0–10.5)
nRBC: 0 % (ref 0.0–0.2)

## 2021-04-03 LAB — BASIC METABOLIC PANEL
Anion gap: 7 (ref 5–15)
BUN: 7 mg/dL (ref 6–20)
CO2: 27 mmol/L (ref 22–32)
Calcium: 8.4 mg/dL — ABNORMAL LOW (ref 8.9–10.3)
Chloride: 103 mmol/L (ref 98–111)
Creatinine, Ser: 0.86 mg/dL (ref 0.44–1.00)
GFR, Estimated: >60 mL/min (ref >60)
Glucose, Bld: 101 mg/dL — ABNORMAL HIGH (ref 70–99)
Potassium: 4.1 mmol/L (ref 3.5–5.1)
Sodium: 137 mmol/L (ref 135–145)

## 2021-04-03 MED ORDER — OXYCODONE HCL 5 MG PO TABS
15.0000 mg | ORAL_TABLET | ORAL | Status: DC
  Administered 2021-04-03 – 2021-04-05 (×13): 15 mg via ORAL
  Filled 2021-04-03 (×14): qty 3

## 2021-04-03 NOTE — Telephone Encounter (Signed)
Patient aware.

## 2021-04-03 NOTE — Op Note (Signed)
[name], [name] MEDICAL RECORD NO: [id number] ACCOUNT NO: [id number] DATE OF BIRTH: [birth year] FACILITY: [facility] LOCATION: [facility] PHYSICIAN: Yetta Barre. Marlou Sa, MD  Operative Report   PREOPERATIVE DIAGNOSIS:  Right knee extension contracture.  POSTOPERATIVE DIAGNOSIS:  Right knee extension contracture.  PROCEDURE:  Right knee Judet quadricepsplasty with placement of incisional wound VAC x2.  SURGEON ATTENDING: Dalphine Handing, MD.  ASSISTANT: Annie Main, PA.  INDICATIONS:  The patient is a 41 year old patient with a spinal cord injury and knee extension contractures bilaterally, presents now for quadricepsplasty in order to facilitate better positioning as well as potentially be able to walk and transfer as  her quad and hamstring strength has returned in both legs.  DESCRIPTION OF PROCEDURE:  The patient was brought to the operating room where general anesthetic was induced.  Preoperative antibiotics were administered.  Timeout was called.  Right leg had range of motion of 0 to about 15 degrees.  Right leg was  pre-scrubbed with alcohol, Betadine, and allowed to air dry, prepped with DuraPrep solution and draped in a sterile manner.  Ioban used to cover the operative field.  We did prep up above the iliac crest with split drapes.  Initially, a medial incision  was made around the medial border of the patella.  Skin and subcutaneous tissue were sharply divided.  Full thickness flaps maintained.  The arthrotomy was performed down to the inferior pole of the patella extending proximally to the VMO.  The medial  gutter was re-created.  Suprapatellar pouch was then re-created manually.  Adhesions between the fat pad and the proximal tibia were released with a Cobb elevator.  MCL was then released off the tibia along its length.  Next, an incision was made on the  lateral aspect of the knee.  Skin and subcutaneous tissue were sharply divided with an incision that extended on the lateral  aspect of the equator of the lateral part of the patella extending proximally just proximal to the femoral head.  Skin and  subcutaneous tissue were sharply divided proximally.  It should be noted that we used a tourniquet for this portion of the case on the medial and lateral distal incisions.  The iliotibial band and capsule were divided with full-thickness flaps  maintained.  About a 7 cm distance maintained between the two incisions distally.  The lateral gutter was then re-created.  Suprapatellar pouch was re-created.  On the medial side, the VMO was mobilized manually.  On the lateral side, the iliotibial band  was split up to the lateral trochanter after that incision was extended proximally after we removed the tourniquet.  Bleeding points encountered were controlled using electrocautery.  The iliotibial band was split.  The vastus lateralis was then  elevated manually off the femur beginning around the linea aspera.  This was performed up to the level of the lesser trochanter.  Next, the knee was manipulated.  We did not get much improvement in flexion.  Extensor mechanism still remained tight.   Manually, the planes were redeveloped between the vastus lateralis, the rectus and the vastus medialis.  Also, the vastus intermedius was elevated manually off the femur.  Next, under direct visualization, both heads of the rectus were released with care  being taken to avoid injury to the femoral nerve.  After this was performed, we were able to achieve much more flexion to about 120.  Next, thorough irrigation was performed.  The patient had good stability at the knee.  Extensor mechanism was  sliding  much better.  No further intervention required.  Thorough irrigation performed then with 4 liters of irrigating solution.  Vancomycin powder placed laterally.  The capsular incisions remained open.  The iliotibial band was closed, beginning at mid-thigh  and extending proximally.  This was done with #1  Vicryl suture.  Next, a deep subcutaneous tissue was closed on both sides using interrupted inverted #1 Vicryl suture followed by interrupted inverted 0 Vicryl suture, 2-0 Vicryl suture, and staples for  watertight closure.  It should be noted that on the proximal lateral aspect of the incision, 30 mL of Marcaine, morphine, clonidine was injected for postop pain relief.  Incisional wound VACs applied.  Same range of motion maintained at 120.  Bulky wrap  only placed around the knee.  The patient tolerated the procedure well without immediate complication.    Luke's assistance was required for opening, closing, mobilization of tissue.  His assistance was a medical necessity.      PAA D: 04/02/2021 2:48:36 pm T: 04/03/2021 12:39:00 am  JOB: [id number]/ [id number]

## 2021-04-03 NOTE — Progress Notes (Signed)
Wound care done on patient and she tolerated it well.

## 2021-04-03 NOTE — Consult Note (Signed)
[place] Nurse Consult Note: Reason for Consult:Stage 4 sacral pressure injury, present on admission.  Seen at wound care center.  Barriers to care include no HH for advanced modalities like VAC therapy  Here this admission for right knee extension contracture. Incisional VAC in place.  Wound type:stage 4 pressure injury Pressure Injury POA: Yes Measurement: 2.5 cm x 1 cm x 1.3 cm Wound CQF:JUVQQ red Drainage (amount, consistency, odor) minimal serosanguinous  no odor.  Periwound:intact  Dressing procedure/placement/frequency:Cleanse sacral wound with NS and pat dry Apply Aquacel Ag to wound bed (LAWSON # [id number]).  Fill wound depth with fluffed gauze  Cover with sacral foam. Change Mon/Wed/Fri and PRN soilage.  Recommend low air loss mattress Prevalon boots to heels.  Will not follow at this time.  Please re-consult if needed.  Domenic Moras MSN, RN, [name]-BC CWON Wound, Ostomy, Continence Nurse Pager [phone]

## 2021-04-03 NOTE — TOC Initial Note (Signed)
Transition of Care [facility]) - Initial/Assessment Note    Patient Details  Name: [name] MRN: [id number] Date of Birth: 02-17-1981  [facility] CM/SW Contact:    Marilu Favre, RN Phone Number: 04/03/2021, 9:37 AM  Clinical Narrative:                  Spoke to patient at bedside. Confirmed face sheet information. Patient from home with her mother.   Patient has an aide from 8 am to 5 pm ( 45 hours a week). Mother assists her also.   Mother does dressing changes.   Patient has a power wheel chair and a lift at home already.   Mother is Fredrik Cove [phone]    Barriers to Discharge: Continued Medical Work up   Patient Goals and CMS Choice Patient states their goals for this hospitalization and ongoing recovery are:: to return to home CMS Medicare.gov Compare Post Acute Care list provided to:: Patient    Expected Discharge Plan and Services     Discharge Planning Services: CM Consult                       DME Agency: NA                  Prior Living Arrangements/Services   Lives with:: Parents (mother) Patient language and need for interpreter reviewed:: Yes Do you feel safe going back to the place where you live?: Yes      Need for Family Participation in Patient Care: Yes (Comment) Care giver support system in place?: Yes (comment) Current home services: DME Criminal Activity/Legal Involvement Pertinent to Current Situation/Hospitalization: No - Comment as needed  Activities of Daily Living      Permission Sought/Granted   Permission granted to share information with : No              Emotional Assessment Appearance:: Appears stated age Attitude/Demeanor/Rapport: Engaged Affect (typically observed): Accepting Orientation: : Oriented to Self, Oriented to Place, Oriented to  Time, Oriented to Situation Alcohol / Substance Use: Not Applicable Psych Involvement: No (comment)  Admission diagnosis:  S/P knee surgery  [Z98.890] Patient Active Problem List   Diagnosis Date Noted   S/P knee surgery 04/02/2021   Contractures involving both knees 09/13/2020   Lower abdominal pain    Essential hypertension 04/02/2020   Oxygen dependent 04/02/2020   Erosion of urethra due to catheterization of urinary tract ([place]) 01/19/2020   Obesity, Class III, BMI 40-49.9 (morbid obesity) ([place])    Bilateral ovarian cysts 12/06/2019   Muscle spasticity 12/06/2019   Chronic cystitis with hematuria 10/28/2019   Generalized anxiety disorder 07/04/2019   Controlled substance agreement signed 07/04/2019   Suprapubic catheter ([place]) 07/04/2019   Mixed hyperlipidemia 07/04/2019   Neurogenic bladder 07/04/2019   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[place])    GERD (gastroesophageal reflux disease)    Chronic pain 05/04/2018   Pressure injury of skin 06/11/2016   Injury of spinal cord at L1 level, subsequent encounter ([place]) 04/17/2016   Post-traumatic paraplegia 04/09/2016   Neurogenic bowel 04/09/2016   Dysmenorrhea 08/09/2012   Irregular menstrual cycle 08/09/2012   Shoulder pain, left 11/07/2011   H/O vitamin D deficiency 09/02/2011   Obesity 08/06/2010   Hypothyroidism 08/06/2010   Recurrent depression ([place]) 08/06/2010   Mental retardation, mild (I.Q. 50-70) 08/06/2010   PCP:  Loman Brooklyn, [name] Pharmacy:   [id number] - [place], [place] -  [address] [place] [address] Phone: [phone] Fax: [phone] - [place], [place] [address] Phone: [phone] Fax: [phone]     Social Determinants of Health (SDOH) Interventions    Readmission Risk Interventions Readmission Risk Prevention Plan 06/04/2020 01/13/2020 01/10/2020  Transportation Screening Complete - Complete  PCP or Specialist Appt within 3-5 Days Complete - -  HRI or Home Care Consult Complete - -  Social Work Consult for [place]  Planning/Counseling Complete - -  Palliative Care Screening Not Applicable - -  Medication Review Press photographer) Complete - Complete  PCP or Specialist appointment within 3-5 days of discharge - Complete -  [place] or [place] - - Complete  SW Recovery Care/Counseling Consult - - Complete  [place] - - Not Applicable  Some recent data might be hidden

## 2021-04-03 NOTE — Anesthesia Postprocedure Evaluation (Signed)
Anesthesia Post Note  Patient: Cytogeneticist  Procedure(s) Performed: RIGHT LEG JUDET QUADRICEPSPLASTY (Right) APPLICATION OF WOUND VAC (Right: Leg Upper)     Patient location during evaluation: PACU Anesthesia Type: General Level of consciousness: awake and alert Pain management: pain level controlled Vital Signs Assessment: post-procedure vital signs reviewed and stable Respiratory status: spontaneous breathing, nonlabored ventilation, respiratory function stable and patient connected to nasal cannula oxygen Cardiovascular status: blood pressure returned to baseline and stable Postop Assessment: no apparent nausea or vomiting Anesthetic complications: no   No notable events documented.  Last Vitals:  Vitals:   04/03/21 0007 04/03/21 0427  BP: 127/74 (!) 110/53  Pulse: 91 77  Resp: 16 17  Temp: 36.6 C 36.4 C  SpO2: 95% 95%    Last Pain:  Vitals:   04/03/21 1002  TempSrc:   PainSc: [place]

## 2021-04-03 NOTE — Telephone Encounter (Signed)
This is fine. She did the correct thing by letting us know.
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
## 2021-04-04 DIAGNOSIS — M21271 Flexion deformity, right ankle and toes: Secondary | ICD-10-CM | POA: Diagnosis present

## 2021-04-04 DIAGNOSIS — R42 Dizziness and giddiness: Secondary | ICD-10-CM | POA: Diagnosis not present

## 2021-04-04 DIAGNOSIS — M21272 Flexion deformity, left ankle and toes: Secondary | ICD-10-CM | POA: Diagnosis present

## 2021-04-04 DIAGNOSIS — K592 Neurogenic bowel, not elsewhere classified: Secondary | ICD-10-CM | POA: Diagnosis present

## 2021-04-04 DIAGNOSIS — Z6841 Body Mass Index (BMI) 40.0 and over, adult: Secondary | ICD-10-CM | POA: Diagnosis not present

## 2021-04-04 DIAGNOSIS — Z8249 Family history of ischemic heart disease and other diseases of the circulatory system: Secondary | ICD-10-CM | POA: Diagnosis not present

## 2021-04-04 DIAGNOSIS — J449 Chronic obstructive pulmonary disease, unspecified: Secondary | ICD-10-CM | POA: Diagnosis present

## 2021-04-04 DIAGNOSIS — M24561 Contracture, right knee: Secondary | ICD-10-CM | POA: Diagnosis present

## 2021-04-04 DIAGNOSIS — N312 Flaccid neuropathic bladder, not elsewhere classified: Secondary | ICD-10-CM | POA: Diagnosis present

## 2021-04-04 DIAGNOSIS — Z800 Family history of malignant neoplasm of digestive organs: Secondary | ICD-10-CM | POA: Diagnosis not present

## 2021-04-04 DIAGNOSIS — G8220 Paraplegia, unspecified: Secondary | ICD-10-CM | POA: Diagnosis present

## 2021-04-04 DIAGNOSIS — E039 Hypothyroidism, unspecified: Secondary | ICD-10-CM | POA: Diagnosis present

## 2021-04-04 DIAGNOSIS — M25861 Other specified joint disorders, right knee: Secondary | ICD-10-CM | POA: Diagnosis present

## 2021-04-04 DIAGNOSIS — M85861 Other specified disorders of bone density and structure, right lower leg: Secondary | ICD-10-CM | POA: Diagnosis present

## 2021-04-04 DIAGNOSIS — S34101S Unspecified injury to L1 level of lumbar spinal cord, sequela: Secondary | ICD-10-CM | POA: Diagnosis not present

## 2021-04-04 DIAGNOSIS — F32A Depression, unspecified: Secondary | ICD-10-CM | POA: Diagnosis present

## 2021-04-04 DIAGNOSIS — I10 Essential (primary) hypertension: Secondary | ICD-10-CM | POA: Diagnosis present

## 2021-04-04 DIAGNOSIS — Z9049 Acquired absence of other specified parts of digestive tract: Secondary | ICD-10-CM | POA: Diagnosis not present

## 2021-04-04 DIAGNOSIS — L89154 Pressure ulcer of sacral region, stage 4: Secondary | ICD-10-CM | POA: Diagnosis present

## 2021-04-04 DIAGNOSIS — F419 Anxiety disorder, unspecified: Secondary | ICD-10-CM | POA: Diagnosis present

## 2021-04-04 DIAGNOSIS — Z8049 Family history of malignant neoplasm of other genital organs: Secondary | ICD-10-CM | POA: Diagnosis not present

## 2021-04-04 DIAGNOSIS — K219 Gastro-esophageal reflux disease without esophagitis: Secondary | ICD-10-CM | POA: Diagnosis present

## 2021-04-04 DIAGNOSIS — Z87891 Personal history of nicotine dependence: Secondary | ICD-10-CM | POA: Diagnosis not present

## 2021-04-04 LAB — CBC
HCT: 29.7 % — ABNORMAL LOW (ref 36.0–46.0)
Hemoglobin: 9.3 g/dL — ABNORMAL LOW (ref 12.0–15.0)
MCH: 27.8 pg (ref 26.0–34.0)
MCHC: 31.3 g/dL (ref 30.0–36.0)
MCV: 88.9 fL (ref 80.0–100.0)
Platelets: 358 10*3/uL (ref 150–400)
RBC: 3.34 MIL/uL — ABNORMAL LOW (ref 3.87–5.11)
RDW: 16.7 % — ABNORMAL HIGH (ref 11.5–15.5)
WBC: 15.6 10*3/uL — ABNORMAL HIGH (ref 4.0–10.5)
nRBC: 0 % (ref 0.0–0.2)

## 2021-04-04 NOTE — Progress Notes (Signed)
°  Subjective: Patient is a 41 year old female who is POD2 s/p Judet quadricepsplasty.   She reports her pain is worsened yesterday as the block is worn off.  She has occasional dizziness while laying in bed.  Denies any chest pain or shortness of breath.  Denies any other complaints aside from pain and dizziness.  She has been using CPM machine.  She worked well with physical therapy yesterday.  Objective: Vital signs in last 24 hours: Temp:  [97.5 F (36.4 C)-98 F (36.7 C)] 98 F (36.7 C) (02/02 0428) Pulse Rate:  [87-96] 96 (02/02 0428) Resp:  [17-20] 20 (02/02 0428) BP: (103-104)/(47-72) 103/47 (02/02 0428) SpO2:  [95 %-98 %] 98 % (02/02 0428)  Intake/Output from previous day: 02/01 0701 - 02/02 0700 In: 2516.8 [P.O.:720; I.V.:1796.8] Out: 500 [Urine:500] Intake/Output this shift: No intake/output data recorded.  Exam:  Ortho exam demonstrates right lower extremity with wound VAC in good position with suction intact to wound VAC sponges.  No drainage in the wound VAC canister.  No surrounding erythema around the periphery of the wound VAC.  No calf tenderness.  Negative Homans' sign.  Palpable DP pulse.  Patient appears well.  Labs: Recent Labs    04/03/21 0137 04/04/21 0116  HGB 9.4* 9.3*   Recent Labs    04/03/21 0137 04/04/21 0116  WBC 14.2* 15.6*  RBC 3.42* 3.34*  HCT 30.0* 29.7*  PLT 405* 358   Recent Labs    04/03/21 0137  NA 137  K 4.1  CL 103  CO2 27  BUN 7  CREATININE 0.86  GLUCOSE 101*  CALCIUM 8.4*   No results for input(s): LABPT, INR in the last 72 hours.  Assessment/Plan: Patient is POD 2 s/p Judet quadricepsplasty.  Doing well overall.  Hemoglobin is fairly stable with only 0.1 drop compared with yesterday.  She does have some increased dizziness and lower blood pressure compared with yesterday so plan to continue to monitor this.  Anticipate discharge tomorrow morning as she will not have anyone able to help her until tomorrow..  She will be  set up for CPM machine.  She needs to continue using CPM machine while inpatient at least 6 hours/day set from 0 to 100 degrees.   Gerrianne Scale Fong Mccarry 04/04/2021, 7:36 AM

## 2021-04-05 ENCOUNTER — Other Ambulatory Visit (HOSPITAL_COMMUNITY): Payer: Self-pay

## 2021-04-05 LAB — GLUCOSE, CAPILLARY: Glucose-Capillary: 117 mg/dL — ABNORMAL HIGH (ref 70–99)

## 2021-04-05 MED ORDER — ASPIRIN 81 MG PO CHEW
81.0000 mg | CHEWABLE_TABLET | Freq: Two times a day (BID) | ORAL | 0 refills | Status: DC
  Filled 2021-04-05: qty 60, 30d supply, fill #0

## 2021-04-05 MED ORDER — ASPIRIN 81 MG PO CHEW: 81.0000 mg | CHEWABLE_TABLET | Freq: Two times a day (BID) | ORAL | 0 refills | Status: DC

## 2021-04-05 MED ORDER — OXYCODONE HCL 15 MG PO TABS
15.0000 mg | ORAL_TABLET | Freq: Four times a day (QID) | ORAL | 0 refills | Status: DC | PRN
  Filled 2021-04-05: qty 10, 3d supply, fill #0

## 2021-04-05 MED ORDER — OXYCODONE HCL 15 MG PO TABS: 15.0000 mg | ORAL_TABLET | Freq: Four times a day (QID) | ORAL | 0 refills | Status: DC | PRN

## 2021-04-05 NOTE — Progress Notes (Signed)
Changed to [facility].Patient discharged home in stable condition via [facility] transport Orbie Hurst). Verbalizes understanding of all discharge instructions, including home medications and follow up appointments.

## 2021-04-06 DIAGNOSIS — M24661 Ankylosis, right knee: Secondary | ICD-10-CM

## 2021-04-08 ENCOUNTER — Telehealth: Payer: Self-pay

## 2021-04-08 ENCOUNTER — Telehealth: Payer: Self-pay | Admitting: Orthopedic Surgery

## 2021-04-08 MED ORDER — DOXYCYCLINE HYCLATE 100 MG PO TABS: 100.0000 mg | ORAL_TABLET | Freq: Two times a day (BID) | ORAL | 0 refills | Status: DC

## 2021-04-08 NOTE — Telephone Encounter (Signed)
Patients mom said she has pictures and she can sent them if needed

## 2021-04-08 NOTE — Telephone Encounter (Signed)
I called Lysa and spoke with her mother.  [name] has no subjective fevers or chills.  She does have some redness over the medial incision.  No drainage that she is noticed.  Her temperature was 97.2 at around 5 PM this evening.  Recommended she continue to monitor the incision and if she has further concerns, she can send me a text and my cell number which was provided to her.  After speaking with her she insisted on antibiotics and hung up the phone.

## 2021-04-08 NOTE — Telephone Encounter (Signed)
Patient's mom called back concerning patient's wound vac.  Advised her of message in chart, per Dr. Marlou Sa.  Patient's mom [name] voiced that she understands.  Advised her to call back with any questions.

## 2021-04-08 NOTE — Telephone Encounter (Signed)
Ok for doxy 100 bid x 7 d h/o infxn thx

## 2021-04-08 NOTE — Telephone Encounter (Signed)
IC advised to send photos via mychart

## 2021-04-08 NOTE — Telephone Encounter (Signed)
They will send photos

## 2021-04-08 NOTE — Telephone Encounter (Signed)
Patients mom called into the office stating that when they took the wound vac off that she has blisters under the vac and that it is oozing. Patients mother stated that she needs antibiotics and that she doesn't understand why we are fighting her on this..   Please advise

## 2021-04-08 NOTE — Telephone Encounter (Signed)
Okay to discontinue wound VAC and just keep Ace wrap on the leg.  Thanks

## 2021-04-08 NOTE — Telephone Encounter (Signed)
Pt mother called and states pt has a wound vac and she has a lot of redness and blisters all down leg.Pt mother was transferred to triage.   Cb [phone]

## 2021-04-08 NOTE — Telephone Encounter (Signed)
Per Dr Marlou Sa submitted doxy for patient

## 2021-04-08 NOTE — Telephone Encounter (Signed)
Patient's mom Arville Go called stating that patient's wound vac has been beeping since Saturday and that patient has some redness in the front of her right knee and some blisters at the top of her right leg from where the tape has peeled back.  Stated that she spoke with Dr. Lorin Mercy over the weekend and was advised that home health should come to her home. Patient has an appointment on 04/10/2021.  Would like to know what she needs to do?   Cb# [phone].  Please advise.  Thank you.

## 2021-04-10 ENCOUNTER — Encounter: Payer: Self-pay | Admitting: Orthopedic Surgery

## 2021-04-10 ENCOUNTER — Other Ambulatory Visit: Payer: Self-pay

## 2021-04-10 ENCOUNTER — Ambulatory Visit (INDEPENDENT_AMBULATORY_CARE_PROVIDER_SITE_OTHER): Payer: Medicaid Other | Admitting: Orthopedic Surgery

## 2021-04-10 DIAGNOSIS — M24562 Contracture, left knee: Secondary | ICD-10-CM

## 2021-04-10 DIAGNOSIS — M24561 Contracture, right knee: Secondary | ICD-10-CM

## 2021-04-10 MED ORDER — SULFAMETHOXAZOLE-TRIMETHOPRIM 800-160 MG PO TABS: 1.0000 | ORAL_TABLET | Freq: Two times a day (BID) | ORAL | 0 refills | Status: DC
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
## 2021-04-11 ENCOUNTER — Telehealth: Payer: Self-pay | Admitting: *Deleted

## 2021-04-11 NOTE — Telephone Encounter (Signed)
Patient needs dry dressing and Ace wrap to thigh daily.  Thanks

## 2021-04-11 NOTE — Telephone Encounter (Signed)
Sam [facility] nurse called needing wound care orders for pt. States when she came in there was no dressing placed and did not see any orders. Please call Sam or the Mom back with orders.

## 2021-04-11 NOTE — Telephone Encounter (Signed)
IC LM for wound orders per Dr Marlou Sa

## 2021-04-11 NOTE — Discharge Summary (Signed)
Physician Discharge Summary      [name] MRN: [id number] DOB/AGE: [birth year] 41 y.o.  Admit date: 04/02/2021 Discharge date: 04/05/2021  Admission Diagnoses:  Principal Problem:   S/P knee surgery Active Problems:   Fibrosis of right knee joint   Discharge Diagnoses:  Same  Surgeries: Procedure(s): RIGHT LEG JUDET QUADRICEPSPLASTY APPLICATION OF WOUND VAC on 04/02/2021   Consultants:   Discharged Condition: Stable  Hospital Course: [name] is an 41 y.o. female who was admitted 04/02/2021 with a chief complaint of right leg extension contracture, and found to have a diagnosis of right leg extension contracture.  They were brought to the operating room on 04/02/2021 and underwent the above named procedures.  Pt awoke from anesthesia without complication and was transferred to the floor. On POD1, patient's pain was overall controlled.  She work with physical therapy and uses CPM machine as directed to focus on keeping her range of motion.  Physical therapy signed off and she continue to use a CPM machine over the coming days.  She stayed in the hospital for several days past the observation.  Due to lack of social help at home until her discharge date of 04/05/2021 when her mother could help her.  She discharged home and will f/u with Dr. Marlou Sa in clinic in ~1 weeks.   Antibiotics given:  Anti-infectives (From admission, onward)    Start     Dose/Rate Route Frequency Ordered Stop   04/02/21 2000  ceFAZolin (ANCEF) IVPB 2g/100 mL premix        2 g 200 mL/hr over 30 Minutes Intravenous Every 8 hours 04/02/21 1742 04/03/21 1155   04/02/21 1800  sulfamethoxazole-trimethoprim (BACTRIM) 400-80 MG per tablet 1 tablet  Status:  Discontinued        1 tablet Oral Daily 04/02/21 1450 04/05/21 1928   04/02/21 0930  ceFAZolin (ANCEF) IVPB 3g/100 mL premix        3 g 200 mL/hr over 30 Minutes Intravenous On call to O.R. 04/02/21 6720 04/02/21 1135     .  Recent  vital signs:  Vitals:   04/05/21 0627 04/05/21 0920  BP: 96/62 (!) 104/54  Pulse:  (!) 110  Resp:  19  Temp:  98.5 F (36.9 C)  SpO2:  98%    Recent laboratory studies:  Results for orders placed or performed during the hospital encounter of 94/70/96  Basic metabolic panel  Result Value Ref Range   Sodium 137 135 - 145 mmol/L   Potassium 4.1 3.5 - 5.1 mmol/L   Chloride 103 98 - 111 mmol/L   CO2 27 22 - 32 mmol/L   Glucose, Bld 101 (H) 70 - 99 mg/dL   BUN 7 6 - 20 mg/dL   Creatinine, Ser 0.86 0.44 - 1.00 mg/dL   Calcium 8.4 (L) 8.9 - 10.3 mg/dL   GFR, Estimated >60 >60 mL/min   Anion gap 7 5 - 15  CBC  Result Value Ref Range   WBC 14.2 (H) 4.0 - 10.5 K/uL   RBC 3.42 (L) 3.87 - 5.11 MIL/uL   Hemoglobin 9.4 (L) 12.0 - 15.0 g/dL   HCT 30.0 (L) 36.0 - 46.0 %   MCV 87.7 80.0 - 100.0 fL   MCH 27.5 26.0 - 34.0 pg   MCHC 31.3 30.0 - 36.0 g/dL   RDW 16.4 (H) 11.5 - 15.5 %   Platelets 405 (H) 150 - 400 K/uL   nRBC 0.0 0.0 - 0.2 %  CBC  Result Value Ref Range   WBC 15.6 (H) 4.0 - 10.5 K/uL   RBC 3.34 (L) 3.87 - 5.11 MIL/uL   Hemoglobin 9.3 (L) 12.0 - 15.0 g/dL   HCT 29.7 (L) 36.0 - 46.0 %   MCV 88.9 80.0 - 100.0 fL   MCH 27.8 26.0 - 34.0 pg   MCHC 31.3 30.0 - 36.0 g/dL   RDW 16.7 (H) 11.5 - 15.5 %   Platelets 358 150 - 400 K/uL   nRBC 0.0 0.0 - 0.2 %  Glucose, capillary  Result Value Ref Range   Glucose-Capillary 117 (H) 70 - 99 mg/dL  Pregnancy, urine POC  Result Value Ref Range   Preg Test, Ur NEGATIVE NEGATIVE  Type and screen [place]  Result Value Ref Range   ABO/RH(D) A POS    Antibody Screen NEG    Sample Expiration      04/05/2021,2359 Performed at [facility], [address] [place], [place] [address]     Discharge Medications:   Allergies as of 04/05/2021       Reactions   Macrobid [nitrofurantoin]    Not effective for patient.    Lisinopril Cough        Medication List     STOP taking these medications     losartan 100 MG tablet Commonly known as: COZAAR       TAKE these medications    acetaminophen 500 MG tablet Commonly known as: TYLENOL Take 1,000 mg by mouth daily as needed for moderate pain or headache.   Aspirin Low Dose 81 MG chewable tablet Generic drug: aspirin Chew 1 tablet (81 mg total) by mouth 2 (two) times daily.   azithromycin 250 MG tablet Commonly known as: Zithromax Z-Pak Take 2 tablets (500 mg) PO today, then 1 tablet (250 mg) PO daily x4 days.   baclofen 20 MG tablet Commonly known as: LIORESAL Take 20 mg by mouth 3 (three) times daily as needed (if baclofen pump is running low).   buPROPion 150 MG 12 hr tablet Commonly known as: WELLBUTRIN SR Take 1 tablet (150 mg total) by mouth 2 (two) times daily.   busPIRone 15 MG tablet Commonly known as: BUSPAR TAKE 1 TABLET BY MOUTH TWICE A DAY   citalopram 40 MG tablet Commonly known as: CELEXA Take 1 tablet (40 mg total) by mouth daily.   Combivent Respimat 20-100 MCG/ACT Aers respimat Generic drug: Ipratropium-Albuterol INHALE 1 PUFF INTO THE LUNGS EVERY 6 HOURS AS NEEDED FOR WHEEZING OR SHORTNESS OF BREATH.   [employer]S VITAMIN B12 1000 MCG tablet Generic drug: cyanocobalamin TAKE 1 TABLET BY MOUTH EVERY DAY   [employer]S Vitamin C 500 MG tablet Generic drug: ascorbic acid TAKE 1 TABLET BY MOUTH EVERY DAY   cyclobenzaprine 5 MG tablet Commonly known as: FLEXERIL Take 1 tablet (5 mg total) by mouth 3 (three) times daily as needed for muscle spasms.   diclofenac Sodium 1 % Gel Commonly known as: VOLTAREN APPLY 2 GRAMS TO AFFECTED AREA 4 TIMES A DAY What changed: See the new instructions.   docusate sodium 100 MG capsule Commonly known as: COLACE Take 1 capsule (100 mg total) by mouth every 12 (twelve) hours as needed for mild constipation. What changed: when to take this   ferrous sulfate 325 (65 FE) MG tablet Take 1 tablet (325 mg total) by mouth daily with breakfast.   fluconazole 150 MG  tablet Commonly known as: DIFLUCAN TAKE 1 TABLET BY MOUTH ONCE A WEEK FOR  4 DOSES.   fluticasone 50 MCG/ACT nasal spray Commonly known as: FLONASE SPRAY 2 SPRAYS INTO EACH NOSTRIL EVERY DAY   gabapentin 600 MG tablet Commonly known as: NEURONTIN TAKE 1 TABLET (600 MG TOTAL) BY MOUTH IN THE MORNING, AT NOON, IN THE EVENING, AND AT BEDTIME.   glycerin adult 2 g suppository Place 1 suppository rectally as needed for constipation.   levocetirizine 5 MG tablet Commonly known as: XYZAL TAKE 1 TABLET BY MOUTH EVERY DAY IN THE EVENING   levothyroxine 88 MCG tablet Commonly known as: SYNTHROID TAKE 1 TABLET BY MOUTH DAILY BEFORE BREAKFAST.   lidocaine 5 % ointment Commonly known as: XYLOCAINE Apply 1 application topically as needed.   Misc. Devices Kit purewick external catheter system   nystatin powder Commonly known as: MYCOSTATIN/NYSTOP Apply 1 application topically 3 (three) times daily. What changed:  when to take this reasons to take this   omeprazole 20 MG capsule Commonly known as: PRILOSEC TAKE 1 CAPSULE BY MOUTH EVERY DAY   oxybutynin 5 MG 24 hr tablet Commonly known as: DITROPAN-XL Take 1 tablet (5 mg total) by mouth daily.   Oxycodone HCl 10 MG Tabs Take 1 tablet (10 mg total) by mouth in the morning, at noon, and at bedtime. What changed: Another medication with the same name was added. Make sure you understand how and when to take each.   Oxycodone HCl 10 MG Tabs Take 1 tablet (10 mg total) by mouth in the morning, at noon, and at bedtime. What changed: Another medication with the same name was added. Make sure you understand how and when to take each.   oxyCODONE 15 MG immediate release tablet Commonly known as: ROXICODONE Take 1 tablet (15 mg total) by mouth every 6 (six) hours as needed. What changed: You were already taking a medication with the same name, and this prescription was added. Make sure you understand how and when to take each.    Oxycodone HCl 10 MG Tabs Take 1 tablet (10 mg total) by mouth in the morning, at noon, and at bedtime. Start taking on: April 27, 2021 What changed: Another medication with the same name was added. Make sure you understand how and when to take each.   sulfamethoxazole-trimethoprim 400-80 MG tablet Commonly known as: BACTRIM Take 1 tablet by mouth daily.   Vitamin D3 50 MCG (2000 UT) Tabs Take 1 tablet by mouth daily.       ASK your doctor about these medications    PRESCRIPTION MEDICATION Baclofen Pump        Diagnostic Studies: DG Knee 1-2 Views Right  Result Date: 04/02/2021 CLINICAL DATA:  Status post right quadriceps plasty EXAM: RIGHT KNEE - 1-2 VIEW COMPARISON:  09/26/2020 FINDINGS: Skin staples overlying the distal thigh. No radiodense foreign body. No fracture or dislocation. Alignment preserved. IMPRESSION: Postop changes as above without acute finding. Electronically Signed   By: Lucrezia Europe M.D.   On: 04/02/2021 16:38   DG FEMUR, MIN 2 VIEWS RIGHT  Result Date: 04/02/2021 CLINICAL DATA:  Quadriceps plasty EXAM: RIGHT FEMUR 2 VIEWS COMPARISON:  CT pelvis 06/01/2020 FINDINGS: There is no evidence of fracture or other focal bone lesions. Skin staples lateral to the femur, and anterior to the knee. No radiodense foreign body. Mild diffuse osteopenia. Regional bones otherwise unremarkable. IMPRESSION: Postop changes as above.  No acute findings. Electronically Signed   By: Lucrezia Europe M.D.   On: 04/02/2021 16:37    Disposition: Discharge disposition: 01-Home or Self Care  Discharge Instructions     Call MD / Call 911   Complete by: As directed    If you experience chest pain or shortness of breath, CALL 911 and be transported to the hospital emergency room.  If you develope a fever above 101 F, pus (white drainage) or increased drainage or redness at the wound, or calf pain, call your surgeon's office.   Constipation Prevention   Complete by: As directed     Drink plenty of fluids.  Prune juice may be helpful.  You may use a stool softener, such as Colace (over the counter) 100 mg twice a day.  Use MiraLax (over the counter) for constipation as needed.   Diet - low sodium heart healthy   Complete by: As directed    Discharge instructions   Complete by: As directed    Keep the wound VAC sponges on until you see [place] back in clinic on Wednesday.  Use the Prevena wound VAC hand-held unit that was in the plastic bag in your room that we discussed.  It will be if there are any problems and if it beeps you may call our office on-call number.  Okay for sponge bath or shower.  No submersion underwater such as in a bath, pool, hot tub.  Keep the knee bending with the CPM set from 0-100.  Use the CPM at least 6 hours/day but even more than that is beneficial.  Take aspirin daily for DVT prophylaxis.  Take the prescribed oxycodone for the next couple days until your regular prescribed oxycodone is sent in by her PCP; do not take the prescribed oxycodone on with your normal oxycodone, only take 1 or the other..  Do not take your blood pressure medication at home until your blood pressure normalizes as it was low in the hospital.  Call the office on-call number with any concerns.  Otherwise, we look forward to seeing you on Wednesday.   Increase activity slowly as tolerated   Complete by: As directed    Post-operative opioid taper instructions:   Complete by: As directed    POST-OPERATIVE OPIOID TAPER INSTRUCTIONS: It is important to wean off of your opioid medication as soon as possible. If you do not need pain medication after your surgery it is ok to stop day one. Opioids include: Codeine, Hydrocodone(Norco, Vicodin), Oxycodone(Percocet, oxycontin) and hydromorphone amongst others.  Long term and even short term use of opiods can cause: Increased pain response Dependence Constipation Depression Respiratory depression And more.  Withdrawal symptoms can  include Flu like symptoms Nausea, vomiting And more Techniques to manage these symptoms Hydrate well Eat regular healthy meals Stay active Use relaxation techniques(deep breathing, meditating, yoga) Do Not substitute Alcohol to help with tapering If you have been on opioids for less than two weeks and do not have pain than it is ok to stop all together.  Plan to wean off of opioids This plan should start within one week post op of your joint replacement. Maintain the same interval or time between taking each dose and first decrease the dose.  Cut the total daily intake of opioids by one tablet each day Next start to increase the time between doses. The last dose that should be eliminated is the evening dose.             SignedDonella Stade 04/11/2021, 9:23 AM

## 2021-04-15 ENCOUNTER — Telehealth: Payer: Self-pay | Admitting: Orthopedic Surgery

## 2021-04-15 ENCOUNTER — Emergency Department (HOSPITAL_COMMUNITY): Payer: Medicaid Other

## 2021-04-15 ENCOUNTER — Other Ambulatory Visit: Payer: Self-pay

## 2021-04-15 ENCOUNTER — Emergency Department (HOSPITAL_COMMUNITY)
Admission: EM | Admit: 2021-04-15 | Discharge: 2021-04-16 | Disposition: A | Discharge status: Home / Self Care | Payer: Medicaid Other | Attending: Emergency Medicine | Admitting: Emergency Medicine

## 2021-04-15 ENCOUNTER — Encounter (HOSPITAL_COMMUNITY): Payer: Self-pay

## 2021-04-15 DIAGNOSIS — M25561 Pain in right knee: Secondary | ICD-10-CM | POA: Diagnosis present

## 2021-04-15 DIAGNOSIS — I10 Essential (primary) hypertension: Secondary | ICD-10-CM | POA: Diagnosis not present

## 2021-04-15 DIAGNOSIS — Z4889 Encounter for other specified surgical aftercare: Secondary | ICD-10-CM

## 2021-04-15 DIAGNOSIS — Z48817 Encounter for surgical aftercare following surgery on the skin and subcutaneous tissue: Secondary | ICD-10-CM | POA: Insufficient documentation

## 2021-04-15 DIAGNOSIS — Z79899 Other long term (current) drug therapy: Secondary | ICD-10-CM | POA: Insufficient documentation

## 2021-04-15 DIAGNOSIS — E039 Hypothyroidism, unspecified: Secondary | ICD-10-CM | POA: Diagnosis not present

## 2021-04-15 DIAGNOSIS — Z7982 Long term (current) use of aspirin: Secondary | ICD-10-CM | POA: Insufficient documentation

## 2021-04-15 LAB — CBC WITH DIFFERENTIAL/PLATELET
Abs Immature Granulocytes: 0.02 10*3/uL (ref 0.00–0.07)
Basophils Absolute: 0 10*3/uL (ref 0.0–0.1)
Basophils Relative: 0 %
Eosinophils Absolute: 0.3 10*3/uL (ref 0.0–0.5)
Eosinophils Relative: 3 %
HCT: 35 % — ABNORMAL LOW (ref 36.0–46.0)
Hemoglobin: 10.4 g/dL — ABNORMAL LOW (ref 12.0–15.0)
Immature Granulocytes: 0 %
Lymphocytes Relative: 26 %
Lymphs Abs: 2.8 10*3/uL (ref 0.7–4.0)
MCH: 27.4 pg (ref 26.0–34.0)
MCHC: 29.7 g/dL — ABNORMAL LOW (ref 30.0–36.0)
MCV: 92.1 fL (ref 80.0–100.0)
Monocytes Absolute: 0.6 10*3/uL (ref 0.1–1.0)
Monocytes Relative: 5 %
Neutro Abs: 7.2 10*3/uL (ref 1.7–7.7)
Neutrophils Relative %: 66 %
Platelets: 735 10*3/uL — ABNORMAL HIGH (ref 150–400)
RBC: 3.8 MIL/uL — ABNORMAL LOW (ref 3.87–5.11)
RDW: 17.6 % — ABNORMAL HIGH (ref 11.5–15.5)
WBC: 10.9 10*3/uL — ABNORMAL HIGH (ref 4.0–10.5)
nRBC: 0 % (ref 0.0–0.2)

## 2021-04-15 NOTE — Telephone Encounter (Signed)
Please advise 

## 2021-04-15 NOTE — Telephone Encounter (Signed)
Patients mother called stating the redness, swelling and pain has increased as well as the presence of white fluid oozing from wound. She needs an order from Dr. Marlou Sa for her to be transported to [facility] by ambulance to have the IV antibiotics administered.
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
## 2021-04-15 NOTE — ED Triage Notes (Signed)
Pt c/o right knee pain that began 3 days ago, states it has gotten worse. Pt had knee surgery on 1-31. Pt says the incision is red and warm to the touch, is worried it is getting infected.

## 2021-04-15 NOTE — Telephone Encounter (Signed)
Ok for [employer] and er eval thx

## 2021-04-16 LAB — BASIC METABOLIC PANEL
Anion gap: 13 (ref 5–15)
BUN: 7 mg/dL (ref 6–20)
CO2: 25 mmol/L (ref 22–32)
Calcium: 8.8 mg/dL — ABNORMAL LOW (ref 8.9–10.3)
Chloride: 100 mmol/L (ref 98–111)
Creatinine, Ser: 0.78 mg/dL (ref 0.44–1.00)
GFR, Estimated: >60 mL/min (ref >60)
Glucose, Bld: 99 mg/dL (ref 70–99)
Potassium: 3.9 mmol/L (ref 3.5–5.1)
Sodium: 138 mmol/L (ref 135–145)

## 2021-04-16 MED ORDER — OXYCODONE HCL 5 MG PO TABS
5.0000 mg | ORAL_TABLET | Freq: Once | ORAL | Status: AC
  Administered 2021-04-16: 5 mg via ORAL
  Filled 2021-04-16: qty 1

## 2021-04-16 NOTE — Discharge Instructions (Signed)
You were seen today with concerns for ongoing infection at prior surgical site.  Your labs are reassuring and your x-rays are improved from prior.  I do not see any obvious drainage and you have some local reaction likely related to the staples.  These need to be removed per Dr. Marlou Sa.  Continue your Bactrim at home.

## 2021-04-16 NOTE — ED Notes (Signed)
[facility] notified of patient needing transportation home.

## 2021-04-17 ENCOUNTER — Ambulatory Visit (INDEPENDENT_AMBULATORY_CARE_PROVIDER_SITE_OTHER): Payer: Medicaid Other | Admitting: Orthopedic Surgery

## 2021-04-17 ENCOUNTER — Other Ambulatory Visit: Payer: Self-pay

## 2021-04-17 DIAGNOSIS — M24562 Contracture, left knee: Secondary | ICD-10-CM

## 2021-04-17 DIAGNOSIS — M24561 Contracture, right knee: Secondary | ICD-10-CM

## 2021-04-18 ENCOUNTER — Encounter: Payer: Self-pay | Admitting: Orthopedic Surgery

## 2021-04-18 ENCOUNTER — Encounter (HOSPITAL_BASED_OUTPATIENT_CLINIC_OR_DEPARTMENT_OTHER): Payer: Medicaid Other | Attending: Internal Medicine | Admitting: Internal Medicine

## 2021-04-18 DIAGNOSIS — I10 Essential (primary) hypertension: Secondary | ICD-10-CM | POA: Insufficient documentation

## 2021-04-18 DIAGNOSIS — F17200 Nicotine dependence, unspecified, uncomplicated: Secondary | ICD-10-CM | POA: Diagnosis not present

## 2021-04-18 DIAGNOSIS — G8222 Paraplegia, incomplete: Secondary | ICD-10-CM | POA: Diagnosis not present

## 2021-04-18 DIAGNOSIS — L89154 Pressure ulcer of sacral region, stage 4: Secondary | ICD-10-CM | POA: Diagnosis present
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
## 2021-04-19 NOTE — Progress Notes (Signed)
DORRAINE, ELLENDER ([id number]) Visit Report for 04/18/2021 Arrival Information Details Patient Name: Date of Service: [name], [name] 04/18/2021 1:00 PM Medical Record Number: [id number] Patient Account Number: [id number] Date of Birth/Sex: Treating RN: [birth year] (41 y.o. [name] Primary Care Artrell Lawless: [name] Other Clinician: Referring Peyson Delao: Treating Alexine Pilant/Extender: Mare Ferrari in Treatment: 250 Visit Information History Since Last Visit Added or deleted any medications: Yes Patient Arrived: Wheel Chair Any new allergies or adverse reactions: No Arrival Time: 13:25 Had a fall or experienced change in No Accompanied By: caregiver activities of daily living that may affect Transfer Assistance: [name] Lift risk of falls: Patient Identification Verified: Yes Signs or symptoms of abuse/neglect since last visito No Secondary Verification Process Completed: Yes Hospitalized since last visit: Yes Patient Requires Transmission-Based Precautions: No Implantable device outside of the clinic excluding No Patient Has Alerts: No cellular tissue based products placed in the center since last visit: Has Dressing in Place as Prescribed: Yes Pain Present Now: Yes Electronic Signature(s) Signed: 04/18/2021 5:28:56 PM By: Baruch Gouty RN, BSN Entered By: Baruch Gouty on 04/18/2021 13:26:08 -------------------------------------------------------------------------------- Clinic Level of Care Assessment Details Patient Name: Date of Service: [name], Jamie A. 04/18/2021 1:00 PM Medical Record Number: [id number] Patient Account Number: [id number] Date of Birth/Sex: Treating RN: 09-18-1980 (41 y.o. [name] Primary Care Aaryav Hopfensperger: [name] Other Clinician: Referring Syrianna Schillaci: Treating Shahin Knierim/Extender: Mare Ferrari in Treatment: 250 Clinic Level of Care Assessment Items TOOL 4 Quantity  Score '[]'  - 0 Use when only an EandM is performed on FOLLOW-UP visit ASSESSMENTS - Nursing Assessment / Reassessment X- 1 10 Reassessment of Co-morbidities (includes updates in patient status) X- 1 5 Reassessment of Adherence to Treatment Plan ASSESSMENTS - Wound and Skin A ssessment / Reassessment X - Simple Wound Assessment / Reassessment - one wound 1 5 '[]'  - 0 Complex Wound Assessment / Reassessment - multiple wounds '[]'  - 0 Dermatologic / Skin Assessment (not related to wound area) ASSESSMENTS - Focused Assessment '[]'  - 0 Circumferential Edema Measurements - multi extremities '[]'  - 0 Nutritional Assessment / Counseling / Intervention '[]'  - 0 Lower Extremity Assessment (monofilament, tuning fork, pulses) '[]'  - 0 Peripheral Arterial Disease Assessment (using hand held doppler) ASSESSMENTS - Ostomy and/or Continence Assessment and Care '[]'  - 0 Incontinence Assessment and Management '[]'  - 0 Ostomy Care Assessment and Management (repouching, etc.) PROCESS - Coordination of Care X - Simple Patient / Family Education for ongoing care 1 15 '[]'  - 0 Complex (extensive) Patient / Family Education for ongoing care X- 1 10 Staff obtains Programmer, systems, Records, T Results / Process Orders est '[]'  - 0 Staff telephones HHA, Nursing Homes / Clarify orders / etc '[]'  - 0 Routine Transfer to another Facility (non-emergent condition) '[]'  - 0 Routine Hospital Admission (non-emergent condition) '[]'  - 0 New Admissions / Biomedical engineer / Ordering NPWT Apligraf, etc. , '[]'  - 0 Emergency Hospital Admission (emergent condition) X- 1 10 Simple Discharge Coordination '[]'  - 0 Complex (extensive) Discharge Coordination PROCESS - Special Needs '[]'  - 0 Pediatric / Minor Patient Management '[]'  - 0 Isolation Patient Management '[]'  - 0 Hearing / Language / Visual special needs '[]'  - 0 Assessment of Community assistance (transportation, D/C planning, etc.) '[]'  - 0 Additional assistance / Altered  mentation '[]'  - 0 Support Surface(s) Assessment (bed, cushion, seat, etc.) INTERVENTIONS - Wound Cleansing / Measurement X - Simple Wound Cleansing - one wound 1 5 '[]'  - 0 Complex Wound Cleansing -  multiple wounds X- 1 5 Wound Imaging (photographs - any number of wounds) '[]'  - 0 Wound Tracing (instead of photographs) X- 1 5 Simple Wound Measurement - one wound '[]'  - 0 Complex Wound Measurement - multiple wounds INTERVENTIONS - Wound Dressings X - Small Wound Dressing one or multiple wounds 1 10 '[]'  - 0 Medium Wound Dressing one or multiple wounds '[]'  - 0 Large Wound Dressing one or multiple wounds '[]'  - 0 Application of Medications - topical '[]'  - 0 Application of Medications - injection INTERVENTIONS - Miscellaneous '[]'  - 0 External ear exam '[]'  - 0 Specimen Collection (cultures, biopsies, blood, body fluids, etc.) '[]'  - 0 Specimen(s) / Culture(s) sent or taken to Lab for analysis X- 1 10 Patient Transfer (multiple staff / Civil Service fast streamer / Similar devices) '[]'  - 0 Simple Staple / Suture removal (25 or less) '[]'  - 0 Complex Staple / Suture removal (26 or more) '[]'  - 0 Hypo / Hyperglycemic Management (close monitor of Blood Glucose) '[]'  - 0 Ankle / Brachial Index (ABI) - do not check if billed separately X- 1 5 Vital Signs Has the patient been seen at the hospital within the last three years: Yes Total Score: 95 Level Of Care: New/Established - Level 3 Electronic Signature(s) Signed: 04/18/2021 5:28:56 PM By: Baruch Gouty RN, BSN Entered By: Baruch Gouty on 04/18/2021 14:02:28 -------------------------------------------------------------------------------- Encounter Discharge Information Details Patient Name: Date of Service: Jamie Boyden A. 04/18/2021 1:00 PM Medical Record Number: [id number] Patient Account Number: [id number] Date of Birth/Sex: Treating RN: 1980/06/27 (41 y.o. [name] Primary Care Ashantae Pangallo: [name] Other Clinician: Referring  Merwin Breden: Treating Dejae Bernet/Extender: Mare Ferrari in Treatment: 250 Encounter Discharge Information Items Discharge Condition: Stable Ambulatory Status: Wheelchair Discharge Destination: Home Transportation: Other Accompanied By: caregiver Schedule Follow-up Appointment: Yes Clinical Summary of Care: Patient Declined Notes transportation service Electronic Signature(s) Signed: 04/18/2021 5:28:56 PM By: Baruch Gouty RN, BSN Entered By: Baruch Gouty on 04/18/2021 14:15:47 -------------------------------------------------------------------------------- Lower Extremity Assessment Details Patient Name: Date of Service: [name], Jamie A. 04/18/2021 1:00 PM Medical Record Number: [id number] Patient Account Number: [id number] Date of Birth/Sex: Treating RN: [birth year] (41 y.o. [name] Primary Care Anuj Summons: [name] Other Clinician: Referring Cari Burgo: Treating Colum Colt/Extender: Mare Ferrari in Treatment: 250 Electronic Signature(s) Signed: 04/18/2021 5:28:56 PM By: Baruch Gouty RN, BSN Entered By: Baruch Gouty on 04/18/2021 13:30:12 -------------------------------------------------------------------------------- [place] Details Patient Name: Date of Service: [name], Jamie A. 04/18/2021 1:00 PM Medical Record Number: [id number] Patient Account Number: [id number] Date of Birth/Sex: Treating RN: Oct 12, 1980 (41 y.o. [name] Primary Care Liboria Putnam: [name] Other Clinician: Referring Trevyon Swor: Treating Jamyrah Saur/Extender: Mare Ferrari in Treatment: [place] reviewed with physician Active Inactive Wound/Skin Impairment Nursing Diagnoses: Impaired tissue integrity Knowledge deficit related to smoking impact on wound healing Knowledge deficit related to ulceration/compromised skin integrity Goals: Patient will  demonstrate a reduced rate of smoking or cessation of smoking Date Initiated: 06/27/2016 Date Inactivated: 01/26/2017 Target Resolution Date: 01/08/2017 Goal Status: Unmet Unmet Reason: pt continues to smoke Patient/caregiver will verbalize understanding of skin care regimen Date Initiated: 06/27/2016 Target Resolution Date: 05/10/2021 Goal Status: Active Ulcer/skin breakdown will have a volume reduction of 50% by week 8 Date Initiated: 06/27/2016 Date Inactivated: 12/11/2016 Target Resolution Date: 07/25/2016 Goal Status: Met Interventions: Assess patient/caregiver ability to obtain necessary supplies Assess patient/caregiver ability to perform ulcer/skin care regimen upon admission and as needed Assess ulceration(s) every visit Provide education on  smoking Provide education on ulcer and skin care Treatment Activities: Skin care regimen initiated : 06/27/2016 Topical wound management initiated : 06/27/2016 Notes: Electronic Signature(s) Signed: 04/18/2021 5:28:56 PM By: Baruch Gouty RN, BSN Entered By: Baruch Gouty on 04/18/2021 13:58:17 -------------------------------------------------------------------------------- Pain Assessment Details Patient Name: Date of Service: Jamie Boyden A. 04/18/2021 1:00 PM Medical Record Number: [id number] Patient Account Number: [id number] Date of Birth/Sex: Treating RN: 1980/10/20 (41 y.o. [name] Primary Care Anshu Wehner: [name] Other Clinician: Referring Haylee Mcanany: Treating Aamari Strawderman/Extender: Mare Ferrari in Treatment: 250 Active Problems Location of Pain Severity and Description of Pain Patient Has Paino Yes Site Locations Pain Location: Pain Location: Pain in Ulcers With Dressing Change: No Duration of the Pain. Constant / Intermittento Constant Rate the pain. Current Pain Level: 7 Least Pain Level: 3 Character of Pain Describe the Pain: Aching Pain Management and  Medication Current Pain Management: Medication: Yes Is the Current Pain Management Adequate: Adequate How does your wound impact your activities of daily livingo Sleep: Yes Bathing: No Appetite: No Relationship With Others: No Bladder Continence: No Emotions: Yes Bowel Continence: No Hobbies: No Toileting: No Dressing: No Electronic Signature(s) Signed: 04/18/2021 5:28:56 PM By: Baruch Gouty RN, BSN Entered By: Baruch Gouty on 04/18/2021 13:28:46 -------------------------------------------------------------------------------- Patient/Caregiver Education Details Patient Name: Date of Service: [name] 2/16/2023andnbsp1:00 PM Medical Record Number: [id number] Patient Account Number: [id number] Date of Birth/Gender: Treating RN: [birth year] (41 y.o. [name] Primary Care Physician: [name] Other Clinician: Referring Physician: Treating Physician/Extender: Mare Ferrari in Treatment: 250 Education Assessment Education Provided To: Patient Education Topics Provided Pressure: Methods: Explain/Verbal Responses: Reinforcements needed, State content correctly Wound/Skin Impairment: Methods: Explain/Verbal Responses: Reinforcements needed, State content correctly Electronic Signature(s) Signed: 04/18/2021 5:28:56 PM By: Baruch Gouty RN, BSN Entered By: Baruch Gouty on 04/18/2021 13:58:42 -------------------------------------------------------------------------------- Wound Assessment Details Patient Name: Date of Service: Jamie Boyden A. 04/18/2021 1:00 PM Medical Record Number: [id number] Patient Account Number: [id number] Date of Birth/Sex: Treating RN: Nov 06, 1980 (41 y.o. [name] Primary Care Dann Ventress: [name] Other Clinician: Referring Sua Spadafora: Treating Teah Votaw/Extender: Mare Ferrari in Treatment: 250 Wound Status Wound Number: 1 Primary Etiology:  Pressure Ulcer Wound Location: Medial Sacrum Wound Status: Open Wounding Event: Pressure Injury Comorbid History: Anemia, Osteomyelitis Date Acquired: 05/15/2016 Weeks Of Treatment: 250 Clustered Wound: No Photos Wound Measurements Length: (cm) 2.5 Width: (cm) 1 Depth: (cm) 1.5 Area: (cm) 1.963 Volume: (cm) 2.945 % Reduction in Area: 94% % Reduction in Volume: 98% Epithelialization: Medium (34-66%) Tunneling: No Undermining: Yes Starting Position (o'clock): 12 Ending Position (o'clock): 4 Maximum Distance: (cm) 1.4 Wound Description Classification: Category/Stage IV Wound Margin: Well defined, not attached Exudate Amount: Medium Exudate Type: Serosanguineous Exudate Color: red, brown Foul Odor After Cleansing: No Slough/Fibrino No Wound Bed Granulation Amount: Large (67-100%) Exposed Structure Granulation Quality: Red, Pink Fascia Exposed: No Necrotic Amount: None Present (0%) Fat Layer (Subcutaneous Tissue) Exposed: Yes Tendon Exposed: No Muscle Exposed: No Joint Exposed: No Bone Exposed: No Treatment Notes Wound #1 (Sacrum) Wound Laterality: Medial Cleanser Soap and Water Discharge Instruction: May shower and wash wound with dial antibacterial soap and water prior to dressing change. Wound Cleanser Discharge Instruction: Cleanse the wound with wound cleanser prior to applying a clean dressing using gauze sponges, not tissue or cotton balls. Peri-Wound Care Zinc Oxide Ointment 30g tube Discharge Instruction: Apply Zinc Oxide to periwound with each dressing change Topical Primary Dressing KerraCel Ag Gelling Fiber Dressing, 4x5 in (silver  alginate) Discharge Instruction: Apply silver alginate to wound bed as instructed Secondary Dressing Woven Gauze Sponge, Non-Sterile 4x4 in Discharge Instruction: Apply over primary dressing as directed. ABD Pad, 5x9 Discharge Instruction: Apply over primary dressing as directed. Secured With [employer]  Surgical T 2x10 (in/yd) ape Discharge Instruction: Secure with tape as directed. Compression Wrap Compression Stockings Add-Ons Electronic Signature(s) Signed: 04/18/2021 5:28:56 PM By: Baruch Gouty RN, BSN Signed: 04/19/2021 12:44:49 PM By: Dellie Catholic RN Entered By: Dellie Catholic on 04/18/2021 13:52:47 -------------------------------------------------------------------------------- [place] Details Patient Name: Date of Service: Jamie Boyden A. 04/18/2021 1:00 PM Medical Record Number: [id number] Patient Account Number: [id number] Date of Birth/Sex: Treating RN: 1980-10-27 (41 y.o. [name] Primary Care Hadja Harral: [name] Other Clinician: Referring Victoriah Wilds: Treating Ramari Bray/Extender: Mare Ferrari in Treatment: 250 Vital Signs Time Taken: 13:27 Temperature (F): 98.5 Height (in): 65 Pulse (bpm): 86 Source: Stated Respiratory Rate (breaths/min): 18 Weight (lbs): 201 Blood Pressure (mmHg): 140/100 Source: Stated Reference Range: 80 - 120 mg / dl Body Mass Index (BMI): 33.4 Electronic Signature(s) Signed: 04/18/2021 5:28:56 PM By: Baruch Gouty RN, BSN Entered By: Baruch Gouty on 04/18/2021 13:27:54

## 2021-04-24 ENCOUNTER — Ambulatory Visit (INDEPENDENT_AMBULATORY_CARE_PROVIDER_SITE_OTHER): Payer: Medicaid Other | Admitting: Orthopedic Surgery

## 2021-04-24 ENCOUNTER — Other Ambulatory Visit: Payer: Self-pay

## 2021-04-24 DIAGNOSIS — M24561 Contracture, right knee: Secondary | ICD-10-CM

## 2021-04-24 DIAGNOSIS — M24562 Contracture, left knee: Secondary | ICD-10-CM

## 2021-04-26 ENCOUNTER — Other Ambulatory Visit: Payer: Self-pay | Admitting: Family Medicine

## 2021-04-26 ENCOUNTER — Encounter: Payer: Self-pay | Admitting: Orthopedic Surgery

## 2021-04-26 DIAGNOSIS — B372 Candidiasis of skin and nail: Secondary | ICD-10-CM
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
## 2021-05-08 ENCOUNTER — Other Ambulatory Visit: Payer: Self-pay

## 2021-05-08 ENCOUNTER — Ambulatory Visit (INDEPENDENT_AMBULATORY_CARE_PROVIDER_SITE_OTHER): Payer: Medicaid Other | Admitting: Surgical

## 2021-05-08 DIAGNOSIS — M24562 Contracture, left knee: Secondary | ICD-10-CM

## 2021-05-08 DIAGNOSIS — M24561 Contracture, right knee: Secondary | ICD-10-CM

## 2021-05-08 MED ORDER — DOXYCYCLINE HYCLATE 100 MG PO TABS: 100.0000 mg | ORAL_TABLET | Freq: Two times a day (BID) | ORAL | 0 refills | Status: DC

## 2021-05-09 ENCOUNTER — Encounter (HOSPITAL_BASED_OUTPATIENT_CLINIC_OR_DEPARTMENT_OTHER): Payer: Medicaid Other | Attending: General Surgery | Admitting: General Surgery

## 2021-05-09 DIAGNOSIS — S99922A Unspecified injury of left foot, initial encounter: Secondary | ICD-10-CM | POA: Diagnosis present

## 2021-05-09 DIAGNOSIS — G8222 Paraplegia, incomplete: Secondary | ICD-10-CM | POA: Insufficient documentation

## 2021-05-09 DIAGNOSIS — L89154 Pressure ulcer of sacral region, stage 4: Secondary | ICD-10-CM | POA: Insufficient documentation

## 2021-05-10 ENCOUNTER — Encounter: Payer: Self-pay | Admitting: Orthopedic Surgery
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
## 2021-05-10 NOTE — [facility] Notes (Signed)
Jamie, [name] ([id number]) Visit Report for 05/09/2021 Arrival Information Details Patient Name: Date of Service: [name], [name] 05/09/2021 12:45 PM Medical Record Number: [id number] Patient Account Number: [id number] Date of Birth/Sex: Treating RN: 10-22-1980 (41 y.o. [name] Primary Care Xavien Dauphinais: [name] Other Clinician: Referring Ulysee Fyock: Treating Osbaldo Mark/Extender: [name] in Treatment: 14 Visit Information History Since Last Visit Added or deleted any medications: No Patient Arrived: Wheel Chair Any new allergies or adverse reactions: No Arrival Time: 13:00 Had a fall or experienced change in No Accompanied By: caregiver activities of daily living that may affect Transfer Assistance: Harrel Lemon Lift risk of falls: Patient Identification Verified: Yes Signs or symptoms of abuse/neglect since last visito No Secondary Verification Process Completed: Yes Hospitalized since last visit: No Patient Requires Transmission-Based Precautions: No Implantable device outside of the clinic excluding No Patient Has Alerts: No cellular tissue based products placed in the center since last visit: Has Dressing in Place as Prescribed: Yes Pain Present Now: No Electronic Signature(s) Signed: 05/10/2021 1:38:56 PM By: Levan Hurst RN, BSN Entered By: Levan Hurst on 05/09/2021 13:01:32 -------------------------------------------------------------------------------- Encounter Discharge Information Details Patient Name: Date of Service: Jamie Boyden A. 05/09/2021 12:45 PM Medical Record Number: [id number] Patient Account Number: [id number] Date of Birth/Sex: Treating RN: [birth year] (41 y.o. [name] Primary Care Jovita Persing: [name] Other Clinician: Referring Morenike Cuff: Treating Lamya Lausch/Extender: [name] in Treatment: [phone] Encounter Discharge Information Items Post Procedure  Vitals Discharge Condition: Stable Temperature (F): 98.4 Ambulatory Status: Wheelchair Pulse (bpm): 82 Discharge Destination: Home Respiratory Rate (breaths/min): 16 Transportation: Private Auto Blood Pressure (mmHg): 131/96 Accompanied By: caregiver Schedule Follow-up Appointment: Yes Clinical Summary of Care: Patient Declined Electronic Signature(s) Signed: 05/10/2021 1:38:56 PM By: Levan Hurst RN, BSN Entered By: Levan Hurst on 05/09/2021 17:00:23 -------------------------------------------------------------------------------- Multi Wound Chart Details Patient Name: Date of Service: Jamie Boyden A. 05/09/2021 12:45 PM Medical Record Number: [id number] Patient Account Number: [id number] Date of Birth/Sex: Treating RN: [birth year] (41 y.o. F) Primary Care Monteen Toops: [name] Other Clinician: Referring Milen Lengacher: Treating Anslee Micheletti/Extender: [name] in Treatment: 253 Vital Signs Height(in): 65 Pulse(bpm): 82 Weight(lbs): 201 Blood Pressure(mmHg): 131/96 Body Mass Index(BMI): 33.4 Temperature(F): 98.4 Respiratory Rate(breaths/min): 16 Photos: [N/A:N/A] Medial Sacrum N/A N/A Wound Location: Pressure Injury N/A N/A Wounding Event: Pressure Ulcer N/A N/A Primary Etiology: Anemia, Osteomyelitis N/A N/A Comorbid History: 05/15/2016 N/A N/A Date Acquired: 253 N/A N/A Weeks of Treatment: Open N/A N/A Wound Status: No N/A N/A Wound Recurrence: 2x0.7x1.2 N/A N/A Measurements L x W x D (cm) 1.1 N/A N/A A (cm) : rea 1.319 N/A N/A Volume (cm) : 96.70% N/A N/A % Reduction in A rea: 99.10% N/A N/A % 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 N/A N/A Classification: Medium N/A N/A Exudate A mount: Serosanguineous N/A N/A Exudate Type: red, brown N/A N/A Exudate Color: Well defined, not attached N/A N/A Wound Margin: Large (67-100%) N/A N/A Granulation A mount: Red, Pink N/A N/A Granulation Quality: Small (1-33%) N/A  N/A Necrotic A mount: Fat Layer (Subcutaneous Tissue): Yes N/A N/A Exposed Structures: Fascia: No Tendon: No Muscle: No Joint: No Bone: No Medium (34-66%) N/A N/A Epithelialization: Debridement - Selective/Open Wound N/A N/A Debridement: Pre-procedure Verification/Time Out 13:12 N/A N/A Taken: Slough N/A N/A Tissue Debrided: Non-Viable Tissue N/A N/A Level: 1.4 N/A N/A Debridement A (sq cm): rea Curette N/A N/A Instrument: Minimum N/A N/A Bleeding: Pressure N/A N/A Hemostasis A chieved: 0 N/A N/A Procedural Pain: 0 N/A N/A Post  Procedural Pain: Procedure was tolerated well N/A N/A Debridement Treatment Response: 2x0.7x1.2 N/A N/A Post Debridement Measurements L x W x D (cm) 1.319 N/A N/A Post Debridement Volume: (cm) Category/Stage IV N/A N/A Post Debridement Stage: Debridement N/A N/A Procedures Performed: Treatment Notes Electronic Signature(s) Signed: 05/09/2021 1:44:08 PM By: Fredirick Maudlin MD FACS Entered By: Fredirick Maudlin on 05/09/2021 13:44:07 -------------------------------------------------------------------------------- Multi-Disciplinary Care Plan Details Patient Name: Date of Service: Jamie Boyden A. 05/09/2021 12:45 PM Medical Record Number: [id number] Patient Account Number: [id number] Date of Birth/Sex: Treating RN: Oct 19, 1980 (41 y.o. [name] Primary Care Abir Eroh: [name] Other Clinician: Referring Shenouda Genova: Treating Nidal Rivet/Extender: [name] in Treatment: [name] reviewed with physician Active Inactive Wound/Skin Impairment Nursing Diagnoses: Impaired tissue integrity Knowledge deficit related to smoking impact on wound healing Knowledge deficit related to ulceration/compromised skin integrity Goals: Patient will demonstrate a reduced rate of smoking or cessation of smoking Date Initiated: 06/27/2016 Date Inactivated: 01/26/2017 Target Resolution  Date: 01/08/2017 Goal Status: Unmet Unmet Reason: pt continues to smoke Patient/caregiver will verbalize understanding of skin care regimen Date Initiated: 06/27/2016 Target Resolution Date: 06/07/2021 Goal Status: Active Ulcer/skin breakdown will have a volume reduction of 50% by week 8 Date Initiated: 06/27/2016 Date Inactivated: 12/11/2016 Target Resolution Date: 07/25/2016 Goal Status: Met Interventions: Assess patient/caregiver ability to obtain necessary supplies Assess patient/caregiver ability to perform ulcer/skin care regimen upon admission and as needed Assess ulceration(s) every visit Provide education on smoking Provide education on ulcer and skin care Treatment Activities: Skin care regimen initiated : 06/27/2016 Topical wound management initiated : 06/27/2016 Notes: Electronic Signature(s) Signed: 05/10/2021 1:38:56 PM By: Levan Hurst RN, BSN Entered By: Levan Hurst on 05/09/2021 12:54:30 -------------------------------------------------------------------------------- Pain Assessment Details Patient Name: Date of Service: Jamie Boyden A. 05/09/2021 12:45 PM Medical Record Number: [id number] Patient Account Number: [id number] Date of Birth/Sex: Treating RN: 07-31-1980 (41 y.o. [name] Primary Care Carri Spillers: [name] Other Clinician: Referring Yovanni Frenette: Treating Berlyn Saylor/Extender: Trude Mcburney Weeks in Treatment: [phone] Active Problems Location of Pain Severity and Description of Pain Patient Has Paino No Site Locations Pain Management and Medication Current Pain Management: Electronic Signature(s) Signed: 05/10/2021 1:38:56 PM By: Levan Hurst RN, BSN Entered By: Levan Hurst on 05/09/2021 13:02:00 -------------------------------------------------------------------------------- Patient/Caregiver Education Details Patient Name: Date of Service: Karlton Lemon 3/9/2023andnbsp12:45 PM Medical Record Number:  [id number] Patient Account Number: [id number] Date of Birth/Gender: Treating RN: [birth year] (41 y.o. [name] Primary Care Physician: [name] Other Clinician: Referring Physician: Treating Physician/Extender: [name] in Treatment: [phone] Education Assessment Education Provided To: Patient Education Topics Provided Wound/Skin Impairment: Methods: Explain/Verbal Responses: State content correctly Electronic Signature(s) Signed: 05/10/2021 1:38:56 PM By: Levan Hurst RN, BSN Entered By: Levan Hurst on 05/09/2021 12:54:41 -------------------------------------------------------------------------------- Wound Assessment Details Patient Name: Date of Service: Jamie Boyden A. 05/09/2021 12:45 PM Medical Record Number: [id number] Patient Account Number: [id number] Date of Birth/Sex: Treating RN: 1980/06/07 (41 y.o. [name] Primary Care Tallula Grindle: [name] Other Clinician: Referring Jhamari Markowicz: Treating Cassie Henkels/Extender: Trude Mcburney Weeks in Treatment: 253 Wound Status Wound Number: 1 Primary Etiology: Pressure Ulcer Wound Location: Medial Sacrum Wound Status: Open Wounding Event: Pressure Injury Comorbid History: Anemia, Osteomyelitis Date Acquired: 05/15/2016 Weeks Of Treatment: 253 Clustered Wound: No Photos Wound Measurements Length: (cm) 2 Width: (cm) 0.7 Depth: (cm) 1.2 Area: (cm) 1.1 Volume: (cm) 1.319 % Reduction in Area: 96.7% % Reduction in Volume: 99.1% Epithelialization: Medium (34-66%) Tunneling: No Undermining: No  Wound Description Classification: Category/Stage IV Wound Margin: Well defined, not attached Exudate Amount: Medium Exudate Type: Serosanguineous Exudate Color: red, brown Foul Odor After Cleansing: No Slough/Fibrino No Wound Bed Granulation Amount: Large (67-100%) Exposed Structure Granulation Quality: Red, Pink Fascia Exposed: No Necrotic Amount:  Small (1-33%) Fat Layer (Subcutaneous Tissue) Exposed: Yes Necrotic Quality: Adherent Slough Tendon Exposed: No Muscle Exposed: No Joint Exposed: No Bone Exposed: No Treatment Notes Wound #1 (Sacrum) Wound Laterality: Medial Cleanser Soap and Water Discharge Instruction: May shower and wash wound with dial antibacterial soap and water prior to dressing change. Wound Cleanser Discharge Instruction: Cleanse the wound with wound cleanser prior to applying a clean dressing using gauze sponges, not tissue or cotton balls. Peri-Wound Care Zinc Oxide Ointment 30g tube Discharge Instruction: Apply Zinc Oxide to periwound with each dressing change Topical Primary Dressing KerraCel Ag Gelling Fiber Dressing, 4x5 in (silver alginate) Discharge Instruction: Apply silver alginate to wound bed as instructed Secondary Dressing Woven Gauze Sponge, Non-Sterile 4x4 in Discharge Instruction: Apply over primary dressing as directed. ABD Pad, 5x9 Discharge Instruction: Apply over primary dressing as directed. Secured With [employer] Surgical T 2x10 (in/yd) ape Discharge Instruction: Secure with tape as directed. Compression Wrap Compression Stockings Add-Ons Electronic Signature(s) Signed: 05/10/2021 1:38:56 PM By: Levan Hurst RN, BSN Entered By: Levan Hurst on 05/09/2021 13:08:01 -------------------------------------------------------------------------------- Vitals Details Patient Name: Date of Service: Jamie Boyden A. 05/09/2021 12:45 PM Medical Record Number: [id number] Patient Account Number: [id number] Date of Birth/Sex: Treating RN: 07-Jan-1981 (41 y.o. [name] Primary Care Maye Parkinson: [name] Other Clinician: Referring Lam Bjorklund: Treating Migdalia Olejniczak/Extender: Trude Mcburney Weeks in Treatment: 253 Vital Signs Time Taken: 13:00 Temperature (F): 98.4 Height (in): 65 Pulse (bpm): 82 Weight (lbs): 201 Respiratory Rate  (breaths/min): 16 Body Mass Index (BMI): 33.4 Blood Pressure (mmHg): 131/96 Reference Range: 80 - 120 mg / dl Electronic Signature(s) Signed: 05/10/2021 1:38:56 PM By: Levan Hurst RN, BSN Entered By: Levan Hurst on 05/09/2021 13:01:54

## 2021-05-14 ENCOUNTER — Other Ambulatory Visit: Payer: Self-pay

## 2021-05-14 ENCOUNTER — Encounter (HOSPITAL_COMMUNITY): Payer: Self-pay | Admitting: Orthopedic Surgery

## 2021-05-14 NOTE — [facility] Notes (Signed)
[name] asked me to speak to her mother, [name].  Ms [name] states that [place] does not complain of chest pain or shortness of breath. Jamie Go denies having any s/s of Covid in her household.  Patient denies any known exposure to Covid.  ? ?Jamie Go wants to make sure that staff is careful with patient's sacral area and left heel; sacral wound is almost healed, heel wound, [name] reports is redish/ purple and squishy, she ask that we be careful with the heel when moving patient. Ms [name] reports that she noticed the heel on Sat, she has a scheduled an appointment at the wound care clinic on Friday.  ? ?PCP is Jamie Berthold, [name]. ? ?I instructed Ms [name] to help [name] with a good wash up with antibacteria soap. Wear clean clothes, brush teeth.  Do not wear any lotion, powders or colognes and to bring any valuables. Glasses, contacts, hearing aids, or dentures, partials are not allowed to be worn in the OR.  ? ? ?

## 2021-05-15 ENCOUNTER — Other Ambulatory Visit: Payer: Self-pay

## 2021-05-15 ENCOUNTER — Ambulatory Visit (HOSPITAL_BASED_OUTPATIENT_CLINIC_OR_DEPARTMENT_OTHER): Payer: Medicaid Other | Admitting: Certified Registered Nurse Anesthetist

## 2021-05-15 ENCOUNTER — Encounter (HOSPITAL_COMMUNITY): Payer: Self-pay | Admitting: Orthopedic Surgery

## 2021-05-15 ENCOUNTER — Encounter (HOSPITAL_COMMUNITY)
Admission: RE | Disposition: A | Discharge status: Home / Self Care | Payer: Self-pay | Source: Home / Self Care | Attending: Orthopedic Surgery

## 2021-05-15 ENCOUNTER — Ambulatory Visit (HOSPITAL_COMMUNITY): Payer: Medicaid Other | Admitting: Certified Registered Nurse Anesthetist

## 2021-05-15 ENCOUNTER — Ambulatory Visit (HOSPITAL_COMMUNITY)
Admission: RE | Admit: 2021-05-15 | Discharge: 2021-05-15 | Disposition: A | Discharge status: Home / Self Care | Payer: Medicaid Other | Attending: Orthopedic Surgery | Admitting: Orthopedic Surgery

## 2021-05-15 DIAGNOSIS — F418 Other specified anxiety disorders: Secondary | ICD-10-CM | POA: Diagnosis not present

## 2021-05-15 DIAGNOSIS — Y838 Other surgical procedures as the cause of abnormal reaction of the patient, or of later complication, without mention of misadventure at the time of the procedure: Secondary | ICD-10-CM | POA: Diagnosis not present

## 2021-05-15 DIAGNOSIS — D649 Anemia, unspecified: Secondary | ICD-10-CM | POA: Diagnosis not present

## 2021-05-15 DIAGNOSIS — E039 Hypothyroidism, unspecified: Secondary | ICD-10-CM

## 2021-05-15 DIAGNOSIS — Z87891 Personal history of nicotine dependence: Secondary | ICD-10-CM | POA: Insufficient documentation

## 2021-05-15 DIAGNOSIS — G8220 Paraplegia, unspecified: Secondary | ICD-10-CM | POA: Insufficient documentation

## 2021-05-15 DIAGNOSIS — I10 Essential (primary) hypertension: Secondary | ICD-10-CM

## 2021-05-15 DIAGNOSIS — S71101A Unspecified open wound, right thigh, initial encounter: Secondary | ICD-10-CM | POA: Diagnosis not present

## 2021-05-15 DIAGNOSIS — J449 Chronic obstructive pulmonary disease, unspecified: Secondary | ICD-10-CM | POA: Diagnosis not present

## 2021-05-15 DIAGNOSIS — Z9889 Other specified postprocedural states: Secondary | ICD-10-CM | POA: Insufficient documentation

## 2021-05-15 DIAGNOSIS — T8189XA Other complications of procedures, not elsewhere classified, initial encounter: Secondary | ICD-10-CM | POA: Diagnosis not present

## 2021-05-15 HISTORY — DX: Microcephaly: Q02

## 2021-05-15 HISTORY — DX: Paralytic syndrome, unspecified: G83.9

## 2021-05-15 HISTORY — PX: I & D EXTREMITY: SHX5045

## 2021-05-15 LAB — POCT PREGNANCY, URINE: Preg Test, Ur: NEGATIVE

## 2021-05-15 LAB — BASIC METABOLIC PANEL
Anion gap: 13 (ref 5–15)
BUN: 7 mg/dL (ref 6–20)
CO2: 27 mmol/L (ref 22–32)
Calcium: 9.1 mg/dL (ref 8.9–10.3)
Chloride: 101 mmol/L (ref 98–111)
Creatinine, Ser: 0.46 mg/dL (ref 0.44–1.00)
GFR, Estimated: >60 mL/min (ref >60)
Glucose, Bld: 100 mg/dL — ABNORMAL HIGH (ref 70–99)
Potassium: 3.8 mmol/L (ref 3.5–5.1)
Sodium: 141 mmol/L (ref 135–145)

## 2021-05-15 LAB — CBC
HCT: 38.2 % (ref 36.0–46.0)
Hemoglobin: 11.2 g/dL — ABNORMAL LOW (ref 12.0–15.0)
MCH: 26.2 pg (ref 26.0–34.0)
MCHC: 29.3 g/dL — ABNORMAL LOW (ref 30.0–36.0)
MCV: 89.3 fL (ref 80.0–100.0)
Platelets: 539 10*3/uL — ABNORMAL HIGH (ref 150–400)
RBC: 4.28 MIL/uL (ref 3.87–5.11)
RDW: 15.4 % (ref 11.5–15.5)
WBC: 10.7 10*3/uL — ABNORMAL HIGH (ref 4.0–10.5)
nRBC: 0 % (ref 0.0–0.2)

## 2021-05-15 SURGERY — IRRIGATION AND DEBRIDEMENT EXTREMITY
Anesthesia: General | Laterality: Right

## 2021-05-15 MED ORDER — MIDAZOLAM HCL 2 MG/2ML IJ SOLN
INTRAMUSCULAR | Status: AC
  Filled 2021-05-15: qty 2

## 2021-05-15 MED ORDER — HYDROMORPHONE HCL 1 MG/ML IJ SOLN
0.2500 mg | INTRAMUSCULAR | Status: DC | PRN
  Administered 2021-05-15 (×4): 0.5 mg via INTRAVENOUS

## 2021-05-15 MED ORDER — CHLORHEXIDINE GLUCONATE 0.12 % MT SOLN: 15.0000 mL | Freq: Once | OROMUCOSAL | Status: AC

## 2021-05-15 MED ORDER — LIDOCAINE 2% (20 MG/ML) 5 ML SYRINGE
INTRAMUSCULAR | Status: AC
  Filled 2021-05-15: qty 5

## 2021-05-15 MED ORDER — MIDAZOLAM HCL 2 MG/2ML IJ SOLN: 0.5000 mg | Freq: Once | INTRAMUSCULAR | Status: DC | PRN

## 2021-05-15 MED ORDER — OXYCODONE HCL 5 MG PO TABS
5.0000 mg | ORAL_TABLET | Freq: Once | ORAL | Status: AC | PRN
  Administered 2021-05-15: 5 mg via ORAL

## 2021-05-15 MED ORDER — OXYCODONE HCL 5 MG/5ML PO SOLN: 5.0000 mg | Freq: Once | ORAL | Status: AC | PRN

## 2021-05-15 MED ORDER — ACETAMINOPHEN 500 MG PO TABS: 1000.0000 mg | ORAL_TABLET | Freq: Once | ORAL | Status: DC

## 2021-05-15 MED ORDER — HYDROMORPHONE HCL 1 MG/ML IJ SOLN
INTRAMUSCULAR | Status: AC
  Filled 2021-05-15: qty 1

## 2021-05-15 MED ORDER — FENTANYL CITRATE (PF) 100 MCG/2ML IJ SOLN
INTRAMUSCULAR | Status: DC | PRN
Start: 2021-05-15 — End: 2021-05-15
  Administered 2021-05-15: 50 ug via INTRAVENOUS

## 2021-05-15 MED ORDER — LIDOCAINE 2% (20 MG/ML) 5 ML SYRINGE
INTRAMUSCULAR | Status: DC | PRN
  Administered 2021-05-15: 40 mg via INTRAVENOUS

## 2021-05-15 MED ORDER — DEXAMETHASONE SODIUM PHOSPHATE 10 MG/ML IJ SOLN
INTRAMUSCULAR | Status: DC | PRN
  Administered 2021-05-15: 10 mg via INTRAVENOUS

## 2021-05-15 MED ORDER — FENTANYL CITRATE (PF) 250 MCG/5ML IJ SOLN
INTRAMUSCULAR | Status: AC
  Filled 2021-05-15: qty 5

## 2021-05-15 MED ORDER — ONDANSETRON HCL 4 MG/2ML IJ SOLN
INTRAMUSCULAR | Status: AC
  Filled 2021-05-15: qty 2

## 2021-05-15 MED ORDER — CEFAZOLIN SODIUM-DEXTROSE 2-4 GM/100ML-% IV SOLN
2.0000 g | INTRAVENOUS | Status: AC
  Administered 2021-05-15: 2 g via INTRAVENOUS

## 2021-05-15 MED ORDER — ONDANSETRON HCL 4 MG/2ML IJ SOLN
INTRAMUSCULAR | Status: DC | PRN
  Administered 2021-05-15: 4 mg via INTRAVENOUS

## 2021-05-15 MED ORDER — OXYCODONE HCL 5 MG PO TABS
ORAL_TABLET | ORAL | Status: AC
  Filled 2021-05-15: qty 1

## 2021-05-15 MED ORDER — LACTATED RINGERS IV SOLN: INTRAVENOUS | Status: DC

## 2021-05-15 MED ORDER — MIDAZOLAM HCL 5 MG/5ML IJ SOLN
INTRAMUSCULAR | Status: DC | PRN
  Administered 2021-05-15: 2 mg via INTRAVENOUS

## 2021-05-15 MED ORDER — DEXAMETHASONE SODIUM PHOSPHATE 10 MG/ML IJ SOLN
INTRAMUSCULAR | Status: AC
  Filled 2021-05-15: qty 1

## 2021-05-15 MED ORDER — 0.9 % SODIUM CHLORIDE (POUR BTL) OPTIME
TOPICAL | Status: DC | PRN
  Administered 2021-05-15: 1000 mL

## 2021-05-15 MED ORDER — CHLORHEXIDINE GLUCONATE 0.12 % MT SOLN
OROMUCOSAL | Status: AC
  Administered 2021-05-15: 15 mL via OROMUCOSAL
  Filled 2021-05-15: qty 15

## 2021-05-15 MED ORDER — CEFAZOLIN SODIUM-DEXTROSE 2-4 GM/100ML-% IV SOLN
INTRAVENOUS | Status: AC
  Filled 2021-05-15: qty 100

## 2021-05-15 MED ORDER — PROPOFOL 10 MG/ML IV BOLUS
INTRAVENOUS | Status: DC | PRN
  Administered 2021-05-15: 200 mg via INTRAVENOUS

## 2021-05-15 MED ORDER — MEPERIDINE HCL 25 MG/ML IJ SOLN: 6.2500 mg | INTRAMUSCULAR | Status: DC | PRN

## 2021-05-15 MED ORDER — SCOPOLAMINE 1 MG/3DAYS TD PT72
1.0000 | MEDICATED_PATCH | TRANSDERMAL | Status: DC
  Administered 2021-05-15: 1.5 mg via TRANSDERMAL
  Filled 2021-05-15: qty 1

## 2021-05-15 MED ORDER — ORAL CARE MOUTH RINSE: 15.0000 mL | Freq: Once | OROMUCOSAL | Status: AC

## 2021-05-15 SURGICAL SUPPLY — 34 items
BAG COUNTER SPONGE SURGICOUNT (BAG) IMPLANT
BAG SPNG CNTER NS LX DISP (BAG)
BLADE SURG 21 STRL SS (BLADE) ×2 IMPLANT
BNDG COHESIVE 6X5 TAN NS LF (GAUZE/BANDAGES/DRESSINGS) ×1 IMPLANT
BNDG COHESIVE 6X5 TAN STRL LF (GAUZE/BANDAGES/DRESSINGS) IMPLANT
BNDG GAUZE ELAST 4 BULKY (GAUZE/BANDAGES/DRESSINGS) ×4 IMPLANT
COVER SURGICAL LIGHT HANDLE (MISCELLANEOUS) ×4 IMPLANT
DRAPE U-SHAPE 47X51 STRL (DRAPES) ×2 IMPLANT
DRSG ADAPTIC 3X8 NADH LF (GAUZE/BANDAGES/DRESSINGS) ×2 IMPLANT
DURAPREP 26ML APPLICATOR (WOUND CARE) ×2 IMPLANT
ELECT REM PT RETURN 9FT ADLT (ELECTROSURGICAL)
ELECTRODE REM PT RTRN 9FT ADLT (ELECTROSURGICAL) IMPLANT
GAUZE SPONGE 4X4 12PLY STRL (GAUZE/BANDAGES/DRESSINGS) ×2 IMPLANT
GLOVE SURG ORTHO LTX SZ9 (GLOVE) ×2 IMPLANT
GLOVE SURG UNDER POLY LF SZ9 (GLOVE) ×2 IMPLANT
GOWN STRL REUS W/ TWL XL LVL3 (GOWN DISPOSABLE) ×2 IMPLANT
GOWN STRL REUS W/TWL XL LVL3 (GOWN DISPOSABLE) ×4
GRAFT SKIN WND MICRO 38 (Tissue) ×1 IMPLANT
GRAFT SKIN WND OMEGA3 3X3.5 (Tissue) ×1 IMPLANT
HANDPIECE INTERPULSE COAX TIP (DISPOSABLE)
KIT BASIN OR (CUSTOM PROCEDURE TRAY) ×2 IMPLANT
KIT TURNOVER KIT B (KITS) ×2 IMPLANT
MANIFOLD NEPTUNE II (INSTRUMENTS) ×2 IMPLANT
NS IRRIG 1000ML POUR BTL (IV SOLUTION) ×2 IMPLANT
PACK ORTHO EXTREMITY (CUSTOM PROCEDURE TRAY) ×2 IMPLANT
PAD ARMBOARD 7.5X6 YLW CONV (MISCELLANEOUS) ×4 IMPLANT
SET HNDPC FAN SPRY TIP SCT (DISPOSABLE) IMPLANT
STOCKINETTE IMPERVIOUS 9X36 MD (GAUZE/BANDAGES/DRESSINGS) IMPLANT
SUT ETHILON 2 0 PSLX (SUTURE) ×2 IMPLANT
SWAB COLLECTION DEVICE MRSA (MISCELLANEOUS) ×2 IMPLANT
SWAB CULTURE ESWAB REG 1ML (MISCELLANEOUS) IMPLANT
TOWEL GREEN STERILE (TOWEL DISPOSABLE) ×2 IMPLANT
TUBE CONNECTING 12X1/4 (SUCTIONS) ×2 IMPLANT
YANKAUER SUCT BULB TIP NO VENT (SUCTIONS) ×2 IMPLANT

## 2021-05-15 NOTE — Anesthesia Procedure Notes (Signed)
Procedure Name: LMA Insertion ?Date/Time: 05/15/2021 1:52 PM ?Performed by: Genelle Bal, CRNA ?Pre-anesthesia Checklist: Patient identified, Emergency Drugs available, Suction available and Patient being monitored ?Patient Re-evaluated:Patient Re-evaluated prior to induction ?Oxygen Delivery Method: Circle system utilized ?Preoxygenation: Pre-oxygenation with 100% oxygen ?Induction Type: IV induction ?Ventilation: Mask ventilation without difficulty ?LMA: LMA inserted ?LMA Size: 4.0 ?Number of attempts: 1 ?Airway Equipment and Method: Bite block ?Placement Confirmation: positive ETCO2 ?Tube secured with: Tape ?Dental Injury: Teeth and Oropharynx as per pre-operative assessment  ? ? ? ? ?

## 2021-05-15 NOTE — Transfer of Care (Signed)
Immediate Anesthesia Transfer of Care Note ? ?Patient: [name] ? ?Procedure(s) Performed: RIGHT THIGH WOUND DEBRIDEMENT (Right) ? ?Patient Location: PACU ? ?Anesthesia Type:General ? ?Level of Consciousness: drowsy and patient cooperative ? ?Airway & Oxygen Therapy: Patient Spontanous Breathing and Patient connected to face mask oxygen ? ?Post-op Assessment: Report given to RN and Post -op Vital signs reviewed and stable ? ?Post vital signs: Reviewed and stable ? ?Last Vitals:  ?Vitals Value Taken Time  ?BP 108/68 05/15/21 1432  ?Temp    ?Pulse 70 05/15/21 1434  ?Resp 9 05/15/21 1434  ?SpO2 97 % 05/15/21 1434  ?Vitals shown include unvalidated device data. ? ?Last Pain:  ?Vitals:  ? 05/15/21 1211  ?TempSrc:   ?PainSc: 0-No pain  ?   ? ?  ? ?Complications: No notable events documented. ?
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
## 2021-05-15 NOTE — Anesthesia Postprocedure Evaluation (Signed)
Anesthesia Post Note ? ?Patient: [name] ? ?Procedure(s) Performed: RIGHT THIGH WOUND DEBRIDEMENT (Right) ? ?  ? ?Patient location during evaluation: PACU ?Anesthesia Type: General ?Level of consciousness: awake and alert, oriented and patient cooperative ?Pain management: pain level controlled (pain improving) ?Vital Signs Assessment: post-procedure vital signs reviewed and stable ?Respiratory status: spontaneous breathing, nonlabored ventilation and respiratory function stable ?Cardiovascular status: blood pressure returned to baseline and stable ?Postop Assessment: no apparent nausea or vomiting ?Anesthetic complications: no ? ? ?No notable events documented. ? ?Last Vitals:  ?Vitals:  ? 05/15/21 1530 05/15/21 1547  ?BP: 107/63 126/86  ?Pulse: 65 84  ?Resp: 16 13  ?Temp:    ?SpO2: 100% 100%  ?  ?Last Pain:  ?Vitals:  ? 05/15/21 1530  ?TempSrc:   ?PainSc: 9   ? ? ?  ?  ?  ?  ?  ?  ? ?Khamron Gellert,E. Karsyn Rochin ? ? ? ? ?

## 2021-05-15 NOTE — Op Note (Signed)
05/15/2021 ? ?2:43 PM ? ?PATIENT:  [name]   ? ?PRE-OPERATIVE DIAGNOSIS:  RIGHT THIGH WOUND ? ?POST-OPERATIVE DIAGNOSIS:  Same ? ?PROCEDURE:  RIGHT THIGH WOUND DEBRIDEMENT ?Excision of skin and soft tissue muscle and fascia. ?Local tissue rearrangement for wound closure 13 x 4 cm and 2 cm deep ?Application of Kerecis 3 x 3.5 cm graft with 38 cm? micro powder ? ?SURGEON:  Newt Minion, MD ? ?PHYSICIAN ASSISTANT:None ?ANESTHESIA:   General ? ?PREOPERATIVE INDICATIONS:  [name] is a  41 y.o. female with a diagnosis of RIGHT THIGH WOUND who failed conservative measures and elected for surgical management.   ? ?The risks benefits and alternatives were discussed with the patient preoperatively including but not limited to the risks of infection, bleeding, nerve injury, cardiopulmonary complications, the need for revision surgery, among others, and the patient was willing to proceed. ? ?OPERATIVE IMPLANTS: Kerecis graft 3 x 3.5 cm and Kerecis micro powder 38 cm? ? ?'@ENCIMAGES'$ @ ? ?OPERATIVE FINDINGS: No deep abscess soft tissue was sent for cultures. ? ?OPERATIVE PROCEDURE: Patient was brought the operating room underwent general anesthetic.  After adequate levels anesthesia were obtained patient's right lower extremity was prepped using DuraPrep draped into a sterile field a timeout was called.  Elliptical incision was made around the ulcerative tissue this left a wound that was 13 x 4 cm.  This extended down to healthy viable tissue there was good petechial bleeding no purulence.  The wound extended down to the suprapatella pouch.  The wound was irrigated with normal saline.  The deep open wound was reinforced with collagen graft this included 3 x 3.5 cm Kerecis tissue graft and 38 cm? of Kerecis powder.  Local tissue rearrangement was then performed to close the incision 13 x 4 cm with 2-0 nylon.  A 13 cm Prevena wound VAC was applied this had a good suction fit.  Patient was extubated taken the PACU  in stable condition. ? ?Debridement type: Excisional Debridement ? ?Side: right ? ?Body Location: thigh  ? ?Tools used for debridement: scalpel and rongeur ? ?Pre-debridement Wound size (cm):   Length: 3        Width: 3     Depth: 1  ? ?Post-debridement Wound size (cm):   Length: 13        Width: 4     Depth: 2  ? ?Debridement depth beyond dead/damaged tissue down to healthy viable tissue: yes ? ?Tissue layer involved: skin, subcutaneous tissue, muscle / fascia ? ?Nature of tissue removed: Devitalized Tissue ? ?Irrigation volume: 1 liter    ? ?Irrigation fluid type: Normal Saline ? ? ? ? ?DISCHARGE PLANNING: ? ?Antibiotic duration: Will start oral antibiotics based on cultures. ? ?Weightbearing: Not applicable ? ?Pain medication: Patient has her own pain medicine at home ? ?Dressing care/ Wound VAC: Continue wound VAC for 1 week ? ?Ambulatory devices: Not applicable ? ?Discharge to: Home. ? ?Follow-up: In the office 1 week post operative. ? ? ? ? ? ? ?  ?

## 2021-05-15 NOTE — Anesthesia Preprocedure Evaluation (Addendum)
Anesthesia Evaluation  ?Patient identified by MRN, date of birth, ID band ?Patient awake ? ? ? ?Reviewed: ?Allergy & Precautions, NPO status , Patient's Chart, lab work & pertinent test results ? ?History of Anesthesia Complications ?(+) PONV ? ?Airway ?Mallampati: I ? ?TM Distance: >3 FB ?Neck ROM: Full ? ? ? Dental ? ?(+) Edentulous Upper, Edentulous Lower ?  ?Pulmonary ?COPD,  COPD inhaler, former smoker,  ?  ?breath sounds clear to auscultation ? ? ? ? ? ? Cardiovascular ?hypertension, (-) angina ?Rhythm:Regular Rate:Normal ? ? ?  ?Neuro/Psych ?Seizures -,  Anxiety Depression Paraplegic ?Intrathecal pain pump: Baclofen ?  ? GI/Hepatic ?Neg liver ROS, GERD  Controlled,  ?Endo/Other  ?Hypothyroidism  ? Renal/GU ?negative Renal ROS Bladder dysfunction (neurogenic bladder) ? ? ? ?  ?Musculoskeletal ? ?(+) Arthritis ,  ? Abdominal ?(+) + obese,   ?Peds ? Hematology ? ?(+) Blood dyscrasia (Hb 11.2), anemia ,   ?Anesthesia Other Findings ? ? Reproductive/Obstetrics ? ?  ? ? ? ? ? ? ? ? ? ? ? ? ? ?  ?  ? ? ? ? ? ? ? ?Anesthesia Physical ?Anesthesia Plan ? ?ASA: 3 ? ?Anesthesia Plan: General  ? ?Post-op Pain Management: Tylenol PO (pre-op)*  ? ?Induction: Intravenous ? ?PONV Risk Score and Plan: 4 or greater and Scopolamine patch - Pre-op, Dexamethasone and Ondansetron ? ?Airway Management Planned: LMA ? ?Additional Equipment:  ? ?Intra-op Plan:  ? ?Post-operative Plan:  ? ?Informed Consent: I have reviewed the patients History and Physical, chart, labs and discussed the procedure including the risks, benefits and alternatives for the proposed anesthesia with the patient or authorized representative who has indicated his/her understanding and acceptance.  ? ? ? ?Dental advisory given ? ?Plan Discussed with: Surgeon and CRNA ? ?Anesthesia Plan Comments:   ? ? ? ? ? ?Anesthesia Quick Evaluation ? ?

## 2021-05-16 ENCOUNTER — Encounter (HOSPITAL_COMMUNITY): Payer: Self-pay | Admitting: Orthopedic Surgery

## 2021-05-16 ENCOUNTER — Telehealth: Payer: Self-pay

## 2021-05-16 NOTE — Telephone Encounter (Signed)
I called and sw pt's mother with Dr. Sharol Given to trouble shoot vac. After much discussion Dr. Sharol Given advised to have the nurse remove the vac and apply a dry dressing change and to do this daily. Made appt for her to come in on 05/23/21 and will call with any questions. Advised should continue to work on movement of the knee.  ?

## 2021-05-16 NOTE — Telephone Encounter (Signed)
Patient called into the office stating that the wound vac stating that it is bleeding from where it is attached to her leg. Patient states that there is nothing in the tube. She is in a lot of pain. Patient in unable to come into the office today due to transportation needing to be notified 5 days in advanced.  ? ?Please advise on the next steps .  ? ?

## 2021-05-17 ENCOUNTER — Emergency Department (HOSPITAL_COMMUNITY)
Admission: EM | Admit: 2021-05-17 | Discharge: 2021-05-18 | Disposition: A | Discharge status: Home / Self Care | Payer: Medicaid Other | Attending: Emergency Medicine | Admitting: Emergency Medicine

## 2021-05-17 ENCOUNTER — Other Ambulatory Visit: Payer: Self-pay

## 2021-05-17 ENCOUNTER — Encounter (HOSPITAL_BASED_OUTPATIENT_CLINIC_OR_DEPARTMENT_OTHER): Payer: Medicaid Other | Admitting: General Surgery

## 2021-05-17 ENCOUNTER — Encounter (HOSPITAL_COMMUNITY): Payer: Self-pay | Admitting: *Deleted

## 2021-05-17 DIAGNOSIS — Z7982 Long term (current) use of aspirin: Secondary | ICD-10-CM | POA: Insufficient documentation

## 2021-05-17 DIAGNOSIS — Z4802 Encounter for removal of sutures: Secondary | ICD-10-CM | POA: Diagnosis present

## 2021-05-17 DIAGNOSIS — Z5189 Encounter for other specified aftercare: Secondary | ICD-10-CM

## 2021-05-17 LAB — BASIC METABOLIC PANEL
Anion gap: 11 (ref 5–15)
BUN: 11 mg/dL (ref 6–20)
CO2: 27 mmol/L (ref 22–32)
Calcium: 8.3 mg/dL — ABNORMAL LOW (ref 8.9–10.3)
Chloride: 101 mmol/L (ref 98–111)
Creatinine, Ser: 0.53 mg/dL (ref 0.44–1.00)
GFR, Estimated: >60 mL/min (ref >60)
Glucose, Bld: 87 mg/dL (ref 70–99)
Potassium: 3.6 mmol/L (ref 3.5–5.1)
Sodium: 139 mmol/L (ref 135–145)

## 2021-05-17 LAB — CBC WITH DIFFERENTIAL/PLATELET
Abs Immature Granulocytes: 0.03 10*3/uL (ref 0.00–0.07)
Basophils Absolute: 0 10*3/uL (ref 0.0–0.1)
Basophils Relative: 0 %
Eosinophils Absolute: 0.2 10*3/uL (ref 0.0–0.5)
Eosinophils Relative: 2 %
HCT: 30.7 % — ABNORMAL LOW (ref 36.0–46.0)
Hemoglobin: 9.2 g/dL — ABNORMAL LOW (ref 12.0–15.0)
Immature Granulocytes: 0 %
Lymphocytes Relative: 30 %
Lymphs Abs: 3.1 10*3/uL (ref 0.7–4.0)
MCH: 26.4 pg (ref 26.0–34.0)
MCHC: 30 g/dL (ref 30.0–36.0)
MCV: 88.2 fL (ref 80.0–100.0)
Monocytes Absolute: 0.8 10*3/uL (ref 0.1–1.0)
Monocytes Relative: 8 %
Neutro Abs: 6 10*3/uL (ref 1.7–7.7)
Neutrophils Relative %: 60 %
Platelets: 554 10*3/uL — ABNORMAL HIGH (ref 150–400)
RBC: 3.48 MIL/uL — ABNORMAL LOW (ref 3.87–5.11)
RDW: 15.5 % (ref 11.5–15.5)
WBC: 10.1 10*3/uL (ref 4.0–10.5)
nRBC: 0 % (ref 0.0–0.2)

## 2021-05-17 MED ORDER — HYDROMORPHONE HCL 1 MG/ML IJ SOLN
1.0000 mg | Freq: Once | INTRAMUSCULAR | Status: AC
  Administered 2021-05-17: 1 mg via INTRAMUSCULAR
  Filled 2021-05-17: qty 1

## 2021-05-17 MED ORDER — ONDANSETRON 4 MG PO TBDP
4.0000 mg | ORAL_TABLET | Freq: Once | ORAL | Status: AC
Start: 2021-05-17 — End: 2021-05-17
  Administered 2021-05-17: 4 mg via ORAL
  Filled 2021-05-17: qty 1

## 2021-05-17 MED ORDER — KETOROLAC TROMETHAMINE 30 MG/ML IJ SOLN
30.0000 mg | Freq: Once | INTRAMUSCULAR | Status: AC
  Administered 2021-05-17: 30 mg via INTRAMUSCULAR
  Filled 2021-05-17: qty 1

## 2021-05-17 NOTE — ED Provider Notes (Signed)
?[facility] ?Provider Note ? ? ?CSN: [id number] ?Arrival date & time: 05/17/21  1100 ? ?  ? ?History ? ?Chief Complaint  ?Patient presents with  ? Wound Check  ? ? ?[name] is a 41 y.o. female who is 2 days postop from a right thigh wound debridement by Dr. Sharol Given who continues to have bloody drainage from the incision site.  She underwent bilateral quadricep surgery secondary to contractures and had persistent postsurgical wound with debridement per above.  She was sent home with a wound VAC, however it continued to bleed from around the site, therefore she was advised to remove the wound VAC and apply dry dressings until recheck in their office next week.  She is supposed to flex and extend the knees as part of her recovery, but states she cannot bend the right knee without having significant amount of bleeding from the wound.  She denies fevers or chills.  She does have significant pain despite oxycodone 10 mg prescription.  She does endorse swelling around the incision site but states this has been present ever since her initial surgery 2 months ago. ? ?The history is provided by the patient.  ? ?  ? ?Home Medications ?Prior to Admission medications   ?Medication Sig Start Date End Date Taking? Authorizing Provider  ?acetaminophen (TYLENOL) 500 MG tablet Take 1,000 mg by mouth daily as needed for moderate pain or headache.    [provider]  ?aspirin 81 MG chewable tablet Chew 1 tablet (81 mg total) by mouth 2 (two) times daily. ?Patient not taking: Reported on 05/14/2021 04/05/21   Magnant, Gerrianne Scale, PA-C  ?azithromycin (ZITHROMAX Z-PAK) 250 MG tablet Take 2 tablets (500 mg) PO today, then 1 tablet (250 mg) PO daily x4 days. ?Patient not taking: Reported on 03/22/2021 02/26/21   Loman Brooklyn, [name]  ?baclofen (LIORESAL) 20 MG tablet Take 20 mg by mouth 3 (three) times daily as needed (if baclofen pump is running low).    [provider]  ?buPROPion (WELLBUTRIN SR)  150 MG 12 hr tablet Take 1 tablet (150 mg total) by mouth 2 (two) times daily. 11/21/20   Loman Brooklyn, [name]  ?busPIRone (BUSPAR) 15 MG tablet TAKE 1 TABLET BY MOUTH TWICE A DAY 02/21/21   Loman Brooklyn, [name]  ?Cholecalciferol (VITAMIN D3) 50 MCG (2000 UT) TABS Take 1 tablet by mouth daily. 02/26/21   Loman Brooklyn, [name]  ?citalopram (CELEXA) 40 MG tablet Take 1 tablet (40 mg total) by mouth daily. 02/26/21   Loman Brooklyn, [name]  ?COMBIVENT RESPIMAT 20-100 MCG/ACT AERS respimat INHALE 1 PUFF INTO THE LUNGS EVERY 6 HOURS AS NEEDED FOR WHEEZING OR SHORTNESS OF BREATH. 06/21/20   Loman Brooklyn, [name]  ?CVS VITAMIN B12 1000 MCG tablet TAKE 1 TABLET BY MOUTH EVERY DAY 03/22/21   Loman Brooklyn, [name]  ?CVS VITAMIN C 500 MG tablet TAKE 1 TABLET BY MOUTH EVERY DAY 12/10/20   Loman Brooklyn, [name]  ?cyclobenzaprine (FLEXERIL) 5 MG tablet Take 1 tablet (5 mg total) by mouth 3 (three) times daily as needed for muscle spasms. 11/21/20   Loman Brooklyn, [name]  ?diclofenac Sodium (VOLTAREN) 1 % GEL APPLY 2 GRAMS TO AFFECTED AREA 4 TIMES A DAY ?Patient taking differently: Apply 2 g topically 4 (four) times daily as needed (pain). 02/21/21   Loman Brooklyn, [name]  ?docusate sodium (COLACE) 100 MG capsule Take 1 capsule (100 mg total) by mouth every 12 (twelve)  hours as needed for mild constipation. ?Patient taking differently: Take 100 mg by mouth daily. 12/27/19   Caccavale, Sophia, PA-C  ?doxycycline (VIBRA-TABS) 100 MG tablet Take 1 tablet (100 mg total) by mouth 2 (two) times daily. ?Patient not taking: Reported on 05/17/2021 05/08/21   Magnant, Gerrianne Scale, PA-C  ?ferrous sulfate 325 (65 FE) MG tablet Take 1 tablet (325 mg total) by mouth daily with breakfast. 01/13/20   Barton Dubois, MD  ?fluconazole (DIFLUCAN) 150 MG tablet TAKE 1 TABLET BY MOUTH ONCE A WEEK FOR 4 DOSES. ?Patient not taking: Reported on 05/14/2021 03/18/21   Loman Brooklyn, [name]  ?fluticasone (FLONASE) 50 MCG/ACT nasal spray SPRAY 2 SPRAYS INTO EACH  NOSTRIL EVERY DAY ?Patient taking differently: Place 2 sprays into both nostrils daily as needed for allergies. 03/18/21   Loman Brooklyn, [name]  ?gabapentin (NEURONTIN) 600 MG tablet TAKE 1 TABLET (600 MG TOTAL) BY MOUTH IN THE MORNING, AT NOON, IN THE EVENING, AND AT BEDTIME. 02/21/21   Loman Brooklyn, [name]  ?levocetirizine (XYZAL) 5 MG tablet TAKE 1 TABLET BY MOUTH EVERY DAY IN THE EVENING ?Patient taking differently: Take 5 mg by mouth daily as needed for allergies. 03/18/21   Loman Brooklyn, [name]  ?levothyroxine (SYNTHROID) 88 MCG tablet TAKE 1 TABLET BY MOUTH DAILY BEFORE BREAKFAST. 01/31/21   Loman Brooklyn, [name]  ?Lidocaine 4 % PTCH Place 1 patch onto the skin daily as needed (pain).    [provider]  ?Misc. Devices KIT purewick external catheter system 11/10/19   Terrilee [name], MD  ?nystatin (MYCOSTATIN/NYSTOP) powder Apply 1 application topically 3 (three) times daily. ?Patient taking differently: Apply 1 application. topically 3 (three) times daily as needed (yeast). 09/12/20   Loman Brooklyn, [name]  ?nystatin cream (MYCOSTATIN) Apply 1 application. topically 2 (two) times daily.    [provider]  ?omeprazole (PRILOSEC) 20 MG capsule TAKE 1 CAPSULE BY MOUTH EVERY DAY 10/26/20   [name] F, [name]  ?oxybutynin (DITROPAN-XL) 5 MG 24 hr tablet Take 1 tablet (5 mg total) by mouth daily. 11/21/20   Loman Brooklyn, [name]  ?oxyCODONE (ROXICODONE) 15 MG immediate release tablet Take 1 tablet (15 mg total) by mouth every 6 (six) hours as needed. ?Patient not taking: Reported on 05/14/2021 04/05/21   Donella Stade, PA-C  ?Oxycodone HCl 10 MG TABS Take 1 tablet (10 mg total) by mouth in the morning, at noon, and at bedtime. 04/27/21   Loman Brooklyn, [name]  ?Oxycodone HCl 10 MG TABS Take 1 tablet (10 mg total) by mouth in the morning, at noon, and at bedtime. ?Patient not taking: Reported on 05/14/2021 03/28/21   Loman Brooklyn, [name]  ?Oxycodone HCl 10 MG TABS Take 1 tablet (10 mg total) by mouth  in the morning, at noon, and at bedtime. ?Patient not taking: Reported on 05/14/2021 02/26/21   Loman Brooklyn, [name]  ?PAIN MANAGEMENT INTRATHECAL, IT, PUMP 1 each by Intrathecal route Continuous EPIDURAL. Intrathecal (IT) medication:  Baclofen    [provider]  ?Salicylic Acid-Urea (KERASAL EX) Apply 1 application. topically daily.    [provider]  ?sulfamethoxazole-trimethoprim (BACTRIM) 400-80 MG tablet Take 1 tablet by mouth in the morning.    [provider]  ?   ? ?Allergies    ?Macrobid [nitrofurantoin] and Lisinopril   ? ?Review of Systems   ?Review of Systems  ?Constitutional:  Negative for chills and fever.  ?HENT:  Negative for congestion and sore throat.   ?  Eyes: Negative.   ?Respiratory:  Negative for chest tightness and shortness of breath.   ?Cardiovascular: Negative.   ?Gastrointestinal: Negative.   ?Genitourinary: Negative.   ?Musculoskeletal:  Positive for arthralgias. Negative for joint swelling and neck pain.  ?Skin:  Positive for wound. Negative for rash.  ?Neurological:  Negative for dizziness, weakness, light-headedness, numbness and headaches.  ?Psychiatric/Behavioral: Negative.    ? ?Physical Exam ?Updated Vital Signs ?BP (!) 117/97   Pulse 99   Temp 98.4 ?F (36.9 ?C) (Oral)   Resp 18   Ht '5\' 6"'  (1.676 m)   Wt 122.5 kg   SpO2 96%   BMI 43.58 kg/m?  ?Physical Exam ?Constitutional:   ?   Appearance: She is well-developed.  ?HENT:  ?   Head: Normocephalic.  ?Cardiovascular:  ?   Rate and Rhythm: Normal rate.  ?Pulmonary:  ?   Effort: Pulmonary effort is normal.  ?Skin: ?   Findings: No erythema.  ?   Comments: Sutured incision right suprapatellar region. Inferior edge with trace of serosanguinous drainage. No erythema or purulent dc.   ?Neurological:  ?   Mental Status: She is alert and oriented to person, place, and time.  ?   Sensory: No sensory deficit.  ? ? ? ?ED Results / Procedures / Treatments   ?Labs ?(all labs ordered are listed, but only abnormal  results are displayed) ?Labs Reviewed  ?CBC WITH DIFFERENTIAL/PLATELET - Abnormal; Notable for the following components:  ?    Result Value  ? RBC 3.48 (*)   ? Hemoglobin 9.2 (*)   ? HCT 30.7 (*)   ? Platelets 5

## 2021-05-17 NOTE — Discharge Instructions (Signed)
You incision site looks relatively healthy today, there is no sign of active infection.  It is normal for there to be drainage after the surgery that you had.  Since you do not have the wound VAC in place, you will need to change the dressings more often as discussed, I would minimize range of motion at this time since it seems to worsen the drainage.  Change the dressings when they seep through.   ?

## 2021-05-17 NOTE — ED Triage Notes (Signed)
Pt brought in by [facility] from home with c/o wound draining from right leg. Pt had surgery on right leg back in January. Due to infection pt had another surgery recently and had wound vac removed yesterday. Pt reports since the wound vac was removed yesterday it has steadily been draining. Vitals for EMS- BP 134/98, HR 84, O2 100% RA.  ?

## 2021-05-20 ENCOUNTER — Telehealth: Payer: Self-pay | Admitting: Orthopedic Surgery

## 2021-05-20 ENCOUNTER — Other Ambulatory Visit: Payer: Self-pay

## 2021-05-20 LAB — AEROBIC/ANAEROBIC CULTURE W GRAM STAIN (SURGICAL/DEEP WOUND): Gram Stain: NONE SEEN

## 2021-05-20 NOTE — Telephone Encounter (Signed)
I called pt's mother to advise that letter faxed as requested and to call with any questions.  ?

## 2021-05-20 NOTE — Telephone Encounter (Signed)
Pt called back and the fax number is actually [phone] ?

## 2021-05-20 NOTE — Telephone Encounter (Signed)
Pt's mother need [place] to fax to pt's transportation [employer] that pt appt date and time and that appt cant be rescheduled. Please fax to [phone]. Please call pt's mother once fax has been sent received.Phone number to pt's mother Arville Go is [phone]. ?

## 2021-05-22 ENCOUNTER — Other Ambulatory Visit: Payer: Self-pay | Admitting: Family Medicine

## 2021-05-22 DIAGNOSIS — K219 Gastro-esophageal reflux disease without esophagitis: Secondary | ICD-10-CM

## 2021-05-23 ENCOUNTER — Other Ambulatory Visit: Payer: Self-pay

## 2021-05-23 ENCOUNTER — Ambulatory Visit (INDEPENDENT_AMBULATORY_CARE_PROVIDER_SITE_OTHER): Payer: Medicaid Other | Admitting: Orthopedic Surgery

## 2021-05-23 DIAGNOSIS — S71101S Unspecified open wound, right thigh, sequela: Secondary | ICD-10-CM

## 2021-05-24 ENCOUNTER — Encounter: Payer: Self-pay | Admitting: Orthopedic Surgery
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
## 2021-05-28 ENCOUNTER — Ambulatory Visit (INDEPENDENT_AMBULATORY_CARE_PROVIDER_SITE_OTHER): Payer: Medicaid Other | Admitting: Family Medicine

## 2021-05-28 ENCOUNTER — Encounter: Payer: Self-pay | Admitting: Family Medicine

## 2021-05-28 VITALS — BP 138/88 | HR 83 | Temp 98.2°F

## 2021-05-28 DIAGNOSIS — E038 Other specified hypothyroidism: Secondary | ICD-10-CM

## 2021-05-28 DIAGNOSIS — G894 Chronic pain syndrome: Secondary | ICD-10-CM

## 2021-05-28 DIAGNOSIS — S34101D Unspecified injury to L1 level of lumbar spinal cord, subsequent encounter: Secondary | ICD-10-CM

## 2021-05-28 DIAGNOSIS — T1490XS Injury, unspecified, sequela: Secondary | ICD-10-CM

## 2021-05-28 DIAGNOSIS — F411 Generalized anxiety disorder: Secondary | ICD-10-CM

## 2021-05-28 DIAGNOSIS — F339 Major depressive disorder, recurrent, unspecified: Secondary | ICD-10-CM

## 2021-05-28 DIAGNOSIS — E782 Mixed hyperlipidemia: Secondary | ICD-10-CM | POA: Diagnosis not present

## 2021-05-28 DIAGNOSIS — Z6841 Body Mass Index (BMI) 40.0 and over, adult: Secondary | ICD-10-CM

## 2021-05-28 DIAGNOSIS — N319 Neuromuscular dysfunction of bladder, unspecified: Secondary | ICD-10-CM

## 2021-05-28 DIAGNOSIS — F3342 Major depressive disorder, recurrent, in full remission: Secondary | ICD-10-CM | POA: Diagnosis not present

## 2021-05-28 DIAGNOSIS — G8929 Other chronic pain: Secondary | ICD-10-CM

## 2021-05-28 DIAGNOSIS — Z79899 Other long term (current) drug therapy: Secondary | ICD-10-CM

## 2021-05-28 DIAGNOSIS — M24561 Contracture, right knee: Secondary | ICD-10-CM

## 2021-05-28 DIAGNOSIS — M24562 Contracture, left knee: Secondary | ICD-10-CM

## 2021-05-28 DIAGNOSIS — N368 Other specified disorders of urethra: Secondary | ICD-10-CM

## 2021-05-28 DIAGNOSIS — F410 Panic disorder [episodic paroxysmal anxiety] without agoraphobia: Secondary | ICD-10-CM

## 2021-05-28 DIAGNOSIS — K219 Gastro-esophageal reflux disease without esophagitis: Secondary | ICD-10-CM

## 2021-05-28 DIAGNOSIS — T8389XD Other specified complication of genitourinary prosthetic devices, implants and grafts, subsequent encounter: Secondary | ICD-10-CM

## 2021-05-28 DIAGNOSIS — L89154 Pressure ulcer of sacral region, stage 4: Secondary | ICD-10-CM

## 2021-05-28 DIAGNOSIS — R42 Dizziness and giddiness: Secondary | ICD-10-CM

## 2021-05-28 DIAGNOSIS — I10 Essential (primary) hypertension: Secondary | ICD-10-CM

## 2021-05-28 DIAGNOSIS — Z9359 Other cystostomy status: Secondary | ICD-10-CM

## 2021-05-28 MED ORDER — CYCLOBENZAPRINE HCL 5 MG PO TABS: 5.0000 mg | ORAL_TABLET | Freq: Three times a day (TID) | ORAL | 1 refills | Status: DC

## 2021-05-28 MED ORDER — KETOROLAC TROMETHAMINE 60 MG/2ML IM SOLN
60.0000 mg | Freq: Once | INTRAMUSCULAR | Status: AC
  Administered 2021-05-28: 60 mg via INTRAMUSCULAR

## 2021-05-28 MED ORDER — LEVOTHYROXINE SODIUM 88 MCG PO TABS: 88.0000 ug | ORAL_TABLET | Freq: Every day | ORAL | 1 refills | Status: DC

## 2021-05-28 MED ORDER — SULFAMETHOXAZOLE-TRIMETHOPRIM 400-80 MG PO TABS: 1.0000 | ORAL_TABLET | Freq: Every day | ORAL | 3 refills | Status: DC

## 2021-05-28 MED ORDER — OXYCODONE HCL 10 MG PO TABS: 10.0000 mg | ORAL_TABLET | Freq: Three times a day (TID) | ORAL | 0 refills | Status: DC

## 2021-05-28 MED ORDER — BUSPIRONE HCL 15 MG PO TABS: 15.0000 mg | ORAL_TABLET | Freq: Two times a day (BID) | ORAL | 1 refills | Status: DC

## 2021-05-28 MED ORDER — OXYBUTYNIN CHLORIDE ER 5 MG PO TB24: 5.0000 mg | ORAL_TABLET | Freq: Every day | ORAL | 1 refills | Status: DC

## 2021-05-28 MED ORDER — BUPROPION HCL ER (SR) 150 MG PO TB12: 150.0000 mg | ORAL_TABLET | Freq: Two times a day (BID) | ORAL | 1 refills | Status: DC

## 2021-05-28 MED ORDER — GABAPENTIN 600 MG PO TABS: 600.0000 mg | ORAL_TABLET | Freq: Four times a day (QID) | ORAL | 1 refills | Status: DC

## 2021-05-28 NOTE — Patient Instructions (Signed)
[id number]  ?Welcome1 ?
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
## 2021-05-30 ENCOUNTER — Ambulatory Visit (INDEPENDENT_AMBULATORY_CARE_PROVIDER_SITE_OTHER): Payer: Medicaid Other | Admitting: Orthopedic Surgery

## 2021-05-30 DIAGNOSIS — S71101S Unspecified open wound, right thigh, sequela: Secondary | ICD-10-CM

## 2021-05-30 DIAGNOSIS — M24562 Contracture, left knee: Secondary | ICD-10-CM

## 2021-05-30 DIAGNOSIS — M24561 Contracture, right knee: Secondary | ICD-10-CM

## 2021-06-01 ENCOUNTER — Other Ambulatory Visit: Payer: Self-pay | Admitting: Family Medicine

## 2021-06-01 DIAGNOSIS — B372 Candidiasis of skin and nail: Secondary | ICD-10-CM

## 2021-06-02 ENCOUNTER — Encounter: Payer: Self-pay | Admitting: Family Medicine

## 2021-06-04 ENCOUNTER — Encounter: Payer: Self-pay | Admitting: Orthopedic Surgery
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
## 2021-06-06 ENCOUNTER — Encounter (HOSPITAL_BASED_OUTPATIENT_CLINIC_OR_DEPARTMENT_OTHER): Payer: Medicaid Other | Attending: General Surgery | Admitting: General Surgery

## 2021-06-06 DIAGNOSIS — L89623 Pressure ulcer of left heel, stage 3: Secondary | ICD-10-CM | POA: Diagnosis not present

## 2021-06-06 DIAGNOSIS — L97811 Non-pressure chronic ulcer of other part of right lower leg limited to breakdown of skin: Secondary | ICD-10-CM | POA: Insufficient documentation

## 2021-06-06 DIAGNOSIS — G8222 Paraplegia, incomplete: Secondary | ICD-10-CM | POA: Diagnosis not present

## 2021-06-06 DIAGNOSIS — L89154 Pressure ulcer of sacral region, stage 4: Secondary | ICD-10-CM | POA: Insufficient documentation

## 2021-06-06 NOTE — Progress Notes (Addendum)
Jamie, [name] ([id number]) ?Visit Report for 06/06/2021 ?Arrival Information Details ?Patient Name: Date of Service: ?[name], Jamie L A. 06/06/2021 1:15 PM ?Medical Record Number: [id number] ?Patient Account Number: [id number] ?Date of Birth/Sex: Treating RN: ?16-Oct-1980 (41 y.o. F) ?Primary Care Tatijana Bierly: [name] Other Clinician: ?Referring Pedro Oldenburg: ?Treating Grantham Hippert/Extender: Fredirick Maudlin ?[name] ?Weeks in Treatment: 257 ?Visit Information History Since Last Visit ?Added or deleted any medications: No ?Patient Arrived: Wheel Chair ?Any new allergies or adverse reactions: No ?Arrival Time: 13:12 ?Had a fall or experienced change in No ?Accompanied By: family ?activities of daily living that may affect ?Transfer Assistance: [employer] ?risk of falls: ?Patient Identification Verified: Yes ?Signs or symptoms of abuse/neglect since last visito No ?Secondary Verification Process Completed: Yes ?Hospitalized since last visit: No ?Patient Requires Transmission-Based Precautions: No ?Implantable device outside of the clinic excluding No ?Patient Has Alerts: No ?cellular tissue based products placed in the center ?since last visit: ?Has Dressing in Place as Prescribed: Yes ?Pain Present Now: Yes ?Electronic Signature(s) ?Signed: 06/06/2021 4:34:37 PM By: Adline Peals ?Entered By: Adline Peals on 06/06/2021 13:19:35 ?-------------------------------------------------------------------------------- ?Encounter Discharge Information Details ?Patient Name: Date of Service: ?[name], Jamie L A. 06/06/2021 1:15 PM ?Medical Record Number: [id number] ?Patient Account Number: [id number] ?Date of Birth/Sex: Treating RN: ?Jun 17, 1980 (41 y.o. F) [name], Jamie Claude ?Primary Care Benjamyn Hestand: [name] Other Clinician: ?Referring Jenisse Vullo: ?Treating Carmella Kees/Extender: Fredirick Maudlin ?[name] ?Weeks in Treatment: 257 ?Encounter Discharge Information Items Post Procedure Vitals ?Discharge Condition:  Stable ?Temperature (F): 98.5 ?Ambulatory Status: Wheelchair ?Pulse (bpm): 96 ?Discharge Destination: Home ?Respiratory Rate (breaths/min): 16 ?Transportation: Private Auto ?Blood Pressure (mmHg): 147/86 ?Accompanied By: mother ?Schedule Follow-up Appointment: Yes ?Clinical Summary of Care: Patient Declined ?Electronic Signature(s) ?Signed: 06/07/2021 7:37:57 AM By: Dellie Catholic RN ?Entered By: Dellie Catholic on 06/07/2021 07:37:21 ?-------------------------------------------------------------------------------- ?Lower Extremity Assessment Details ?Patient Name: ?Date of Service: ?[name], Jamie L A. 06/06/2021 1:15 PM ?Medical Record Number: [id number] ?Patient Account Number: [id number] ?Date of Birth/Sex: ?Treating RN: ?Dec 10, 1980 (41 y.o. F) ?Primary Care [name]: [name] ?Other Clinician: ?Referring Corrisa Gibby: ?Treating [name]/Extender: Fredirick Maudlin ?[name] ?Weeks in Treatment: 257 ?Electronic Signature(s) ?Signed: 06/06/2021 4:34:37 PM By: Adline Peals ?Entered By: Adline Peals on 06/06/2021 13:21:05 ?-------------------------------------------------------------------------------- ?Multi Wound Chart Details ?Patient Name: ?Date of Service: ?[name], Jamie L A. 06/06/2021 1:15 PM ?Medical Record Number: [id number] ?Patient Account Number: [id number] ?Date of Birth/Sex: ?Treating RN: ?1980-08-17 (41 y.o. F) ?Primary Care [name]: [name] ?Other Clinician: ?Referring Walt Geathers: ?Treating Reinhardt Licausi/Extender: Fredirick Maudlin ?[name] ?Weeks in Treatment: 257 ?Vital Signs ?Height(in): 65 ?Pulse(bpm): 96 ?Weight(lbs): 201 ?Blood Pressure(mmHg): 147/86 ?Body Mass Index(BMI): 33.4 ?Temperature(??F): 98.5 ?Respiratory Rate(breaths/min): 16 ?Photos: ?Medial Sacrum Left Calcaneus Right, Posterior Upper Leg ?Wound Location: ?Pressure Injury Trauma Skin Tear/Laceration ?Wounding Event: ?Pressure Ulcer Pressure Ulcer Skin Tear ?Primary Etiology: ?Anemia, Osteomyelitis Anemia,  Osteomyelitis Anemia, Osteomyelitis ?Comorbid History: ?05/15/2016 05/12/2021 05/30/2021 ?Date Acquired: ?55 0 0 ?Weeks of Treatment: ?Open Open Open ?Wound Status: ?No No No ?Wound Recurrence: ?1.3x0.4x1.1 3.5x2x0.1 1.3x1.6x0.1 ?Measurements L x W x D (cm) ?0.408 5.498 1.634 ?A (cm?) : ?rea ?0.449 0.55 0.163 ?Volume (cm?) : ?98.80% 0.00% 0.00% ?% Reduction in A rea: ?99.70% 0.00% 0.00% ?% Reduction in Volume: ?Category/Stage IV Category/Stage II Partial Thickness ?Classification: ?Medium Medium Medium ?Exudate A mount: ?Serosanguineous Serosanguineous Serosanguineous ?Exudate Type: ?red, brown red, brown red, brown ?Exudate Color: ?Well defined, not attached N/A Distinct, outline attached ?Wound Margin: ?Medium (34-66%) Small (1-33%) Small (1-33%) ?Granulation A mount: ?Pink Pink Pink ?Granulation Quality: ?Medium (34-66%) Large (67-100%) Large (67-100%) ?Necrotic  A mount: ?Fat Layer (Subcutaneous Tissue): Yes Fat Layer (Subcutaneous Tissue): Yes Fat Layer (Subcutaneous Tissue): Yes ?Exposed Structures: ?Fascia: No ?Fascia: No ?Fascia: No ?Tendon: No ?Tendon: No ?Tendon: No ?Muscle: No ?Muscle: No ?Muscle: No ?Joint: No ?Joint: No ?Joint: No ?Bone: No ?Bone: No ?Bone: No ?Medium (34-66%) Small (1-33%) Small (1-33%) ?Epithelialization: ?Debridement - Excisional Debridement - Excisional N/A ?Debridement: ?Pre-procedure Verification/Time Out 14:28 14:28 N/A ?Taken: ?Other Other N/A ?Pain Control: ?Subcutaneous, Slough Subcutaneous, Slough N/A ?Tissue Debrided: ?Skin/Subcutaneous Tissue Skin/Subcutaneous Tissue N/A ?Level: ?0.52 7 N/A ?Debridement A (sq cm): ?rea ?Curette Curette N/A ?Instrument: ?Minimum Minimum N/A ?Bleeding: ?Pressure Pressure N/A ?Hemostasis A chieved: ?0 0 N/A ?Procedural Pain: ?0 0 N/A ?Post Procedural Pain: ?Procedure was tolerated well Procedure was tolerated well N/A ?Debridement Treatment Response: ?1.3x0.4x1.1 3.5x2x0.1 N/A ?Post Debridement Measurements L x ?W x D (cm) ?0.449 0.55 N/A ?Post  Debridement Volume: (cm?) ?Category/Stage IV Category/Stage II N/A ?Post Debridement Stage: ?Debridement Debridement N/A ?Procedures Performed: ?Treatment Notes ?Electronic Signature(s) ?Signed: 06/06/2021 3:08:47 PM By: Fredirick Maudlin MD FACS ?Entered By: Fredirick Maudlin on 06/06/2021 15:08:47 ?-------------------------------------------------------------------------------- ?Multi-Disciplinary Care Plan Details ?Patient Name: ?Date of Service: ?[name], Jamie L A. 06/06/2021 1:15 PM ?Medical Record Number: [id number] ?Patient Account Number: [id number] ?Date of Birth/Sex: ?Treating RN: ?Apr 25, 1980 (41 y.o. F) [name], Jamie Claude ?Primary Care Mikel Hardgrove: [name] ?Other Clinician: ?Referring Hortense Cantrall: ?Treating Donnice Nielsen/Extender: Fredirick Maudlin ?[name] ?Weeks in Treatment: 257 ?Multidisciplinary Care Plan reviewed with physician ?Active Inactive ?Wound/Skin Impairment ?Nursing Diagnoses: ?Impaired tissue integrity ?Knowledge deficit related to smoking impact on wound healing ?Knowledge deficit related to ulceration/compromised skin integrity ?Goals: ?Patient will demonstrate a reduced rate of smoking or cessation of smoking ?Date Initiated: 06/27/2016 ?Date Inactivated: 01/26/2017 ?Target Resolution Date: 01/08/2017 ?Goal Status: Unmet ?Unmet Reason: pt continues to smoke ?Patient/caregiver will verbalize understanding of skin care regimen ?Date Initiated: 06/27/2016 ?Target Resolution Date: 07/25/2021 ?Goal Status: Active ?Ulcer/skin breakdown will have a volume reduction of 50% by week 8 ?Date Initiated: 06/27/2016 ?Date Inactivated: 12/11/2016 ?Target Resolution Date: 07/25/2016 ?Goal Status: Met ?Interventions: ?Assess patient/caregiver ability to obtain necessary supplies ?Assess patient/caregiver ability to perform ulcer/skin care regimen upon admission and as needed ?Assess ulceration(s) every visit ?Provide education on smoking ?Provide education on ulcer and skin care ?Treatment Activities: ?Skin care  regimen initiated : 06/27/2016 ?Topical wound management initiated : 06/27/2016 ?Notes: ?Electronic Signature(s) ?Signed: 06/07/2021 7:37:57 AM By: Dellie Catholic RN ?Entered By: Dellie Catholic on 06/07/2021 07:35:55 ?
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
## 2021-06-11 ENCOUNTER — Ambulatory Visit: Payer: Medicaid Other | Attending: Orthopedic Surgery

## 2021-06-11 DIAGNOSIS — M25662 Stiffness of left knee, not elsewhere classified: Secondary | ICD-10-CM | POA: Diagnosis present

## 2021-06-11 DIAGNOSIS — M24562 Contracture, left knee: Secondary | ICD-10-CM | POA: Insufficient documentation

## 2021-06-11 DIAGNOSIS — M24561 Contracture, right knee: Secondary | ICD-10-CM | POA: Diagnosis not present

## 2021-06-11 DIAGNOSIS — M25661 Stiffness of right knee, not elsewhere classified: Secondary | ICD-10-CM | POA: Insufficient documentation
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
## 2021-06-20 ENCOUNTER — Encounter (HOSPITAL_BASED_OUTPATIENT_CLINIC_OR_DEPARTMENT_OTHER): Payer: Medicaid Other | Admitting: General Surgery

## 2021-06-20 DIAGNOSIS — L89154 Pressure ulcer of sacral region, stage 4: Secondary | ICD-10-CM | POA: Diagnosis not present
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
## 2021-06-20 NOTE — Progress Notes (Signed)
Jillisa, Harris Apollonia A. ([id number]) ?Visit Report for 06/20/2021 ?Arrival Information Details ?Patient Name: Date of Service: ?Mosquera, CRYSTA L A. 06/20/2021 1:15 PM ?Medical Record Number: [id number] ?Patient Account Number: [id number] ?Date of Birth/Sex: Treating RN: ?1980-05-30 (41 y.o. F) Boehlein, [name] ?Primary Care Adwoa Axe: [name] Other Clinician: ?Referring Bailynn Dyk: ?Treating Neyland Pettengill/Extender: Fredirick Maudlin ?[name] ?Weeks in Treatment: 259 ?Visit Information History Since Last Visit ?Added or deleted any medications: No ?Patient Arrived: Wheel Chair ?Any new allergies or adverse reactions: No ?Arrival Time: 13:35 ?Had a fall or experienced change in No ?Accompanied By: caregiver ?activities of daily living that may affect ?Transfer Assistance: Manual ?risk of falls: ?Patient Identification Verified: Yes ?Signs or symptoms of abuse/neglect since last visito No ?Secondary Verification Process Completed: Yes ?Hospitalized since last visit: Yes ?Patient Requires Transmission-Based Precautions: No ?Implantable device outside of the clinic excluding No ?Patient Has Alerts: No ?cellular tissue based products placed in the center ?since last visit: ?Has Dressing in Place as Prescribed: Yes ?Pain Present Now: Yes ?Electronic Signature(s) ?Signed: 06/20/2021 5:13:15 PM By: Baruch Gouty RN, BSN ?Entered By: Baruch Gouty on 06/20/2021 13:37:16 ?-------------------------------------------------------------------------------- ?Encounter Discharge Information Details ?Patient Name: Date of Service: ?Hack, CRYSTA L A. 06/20/2021 1:15 PM ?Medical Record Number: [id number] ?Patient Account Number: [id number] ?Date of Birth/Sex: Treating RN: ?04-Apr-1980 (41 y.o. F) Boehlein, [name] ?Primary Care Itha Kroeker: [name] Other Clinician: ?Referring Turquoise Esch: ?Treating Rori Goar/Extender: Fredirick Maudlin ?[name] ?Weeks in Treatment: 259 ?Encounter Discharge Information Items Post Procedure  Vitals ?Discharge Condition: Stable ?Temperature (F): 97.9 ?Ambulatory Status: Wheelchair ?Pulse (bpm): 83 ?Discharge Destination: Home ?Respiratory Rate (breaths/min): 18 ?Transportation: Other ?Blood Pressure (mmHg): 130/88 ?Accompanied By: caregiver ?Schedule Follow-up Appointment: Yes ?Clinical Summary of Care: Patient Declined ?Notes ?transportation service ?Electronic Signature(s) ?Signed: 06/20/2021 5:13:15 PM By: Baruch Gouty RN, BSN ?Entered By: Baruch Gouty on 06/20/2021 14:41:35 ?-------------------------------------------------------------------------------- ?Lower Extremity Assessment Details ?Patient Name: ?Date of Service: ?Lavis, CRYSTA L A. 06/20/2021 1:15 PM ?Medical Record Number: [id number] ?Patient Account Number: [id number] ?Date of Birth/Sex: ?Treating RN: ?01/18/1981 (41 y.o. F) Boehlein, [name] ?Primary Care Hasson Gaspard: [name] ?Other Clinician: ?Referring Glyn Gerads: ?Treating Evy Lutterman/Extender: Fredirick Maudlin ?[name] ?Weeks in Treatment: 259 ?Edema Assessment ?Assessed: [Left: No] [Right: No] ?Edema: [Left: Ye] [Right: s] ?Calf ?Left: Right: ?Point of Measurement: From Medial Instep 39 cm ?Ankle ?Left: Right: ?Point of Measurement: From Medial Instep 22.5 cm ?Vascular Assessment ?Pulses: ?Dorsalis Pedis ?Palpable: [Left:Yes] ?Electronic Signature(s) ?Signed: 06/20/2021 5:13:15 PM By: Baruch Gouty RN, BSN ?Entered By: Baruch Gouty on 06/20/2021 13:43:15 ?-------------------------------------------------------------------------------- ?Multi Wound Chart Details ?Patient Name: ?Date of Service: ?Sabina, CRYSTA L A. 06/20/2021 1:15 PM ?Medical Record Number: [id number] ?Patient Account Number: [id number] ?Date of Birth/Sex: ?Treating RN: ?[birth year] (41 y.o. F) Boehlein, [name] ?Primary Care Alphonsus Doyel: [name] ?Other Clinician: ?Referring Deland Slocumb: ?Treating Bakary Bramer/Extender: Fredirick Maudlin ?[name] ?Weeks in Treatment: 259 ?Vital Signs ?Height(in):  65 ?Pulse(bpm): 83 ?Weight(lbs): 201 ?Blood Pressure(mmHg): 130/88 ?Body Mass Index(BMI): 33.4 ?Temperature(??F): 97.8 ?Respiratory Rate(breaths/min): 18 ?Photos: [1:Medial Sacrum] [15:Left Calcaneus] [16:Right, Posterior Upper Leg] ?Wound Location: [1:Pressure Injury] [15:Trauma] [16:Skin Tear/Laceration] ?Wounding Event: [1:Pressure Ulcer] [15:Pressure Ulcer] [16:Skin Tear] ?Primary Etiology: [1:Anemia, Osteomyelitis] [15:Anemia, Osteomyelitis] [16:Anemia, Osteomyelitis] ?Comorbid History: [1:05/15/2016] [15:05/12/2021] [16:05/30/2021] ?Date Acquired: [1:259] [15:2] [16:2] ?Weeks of Treatment: [1:Open] [15:Open] [16:Open] ?Wound Status: [1:No] [15:No] [16:No] ?Wound Recurrence: [1:1.5x0.7x1.5] [15:3.2x1.2x0.1] [16:2.7x2x0.1] ?Measurements L x W x D (cm) [1:0.825] [15:3.016] [16:4.241] ?A (cm?) : ?rea [1:1.237] [15:0.302] [16:0.424] ?Volume (cm?) : [1:97.50%] [15:45.10%] [16:-159.50%] ?% Reduction in A rea: [1:99.10%] [15:45.10%] [16:-160.10%] ?% Reduction in Volume: [1:2] ?Starting Position 1 (  o'clock): [1:5] ?Ending Position 1 (o'clock): [1:2] ?Maximum Distance 1 (cm): [1:Yes] [15:No] [16:No] ?Undermining: [1:Category/Stage IV] [15:Category/Stage II] [16:Full Thickness Without Exposed] ?Classification: [1:Medium] [15:Medium] [16:Support Structures Medium] ?Exudate A mount: [1:Serosanguineous] [15:Serosanguineous] [16:Serosanguineous] ?Exudate Type: [1:red, brown] [15:red, brown] [16:red, brown] ?Exudate Color: [1:Well defined, not attached] [15:Distinct, outline attached] [16:Flat and Intact] ?Wound Margin: [1:Large (67-100%)] [15:Large (67-100%)] [16:Small (1-33%)] ?Granulation A mount: [1:Pink, Pale] [15:Red] [16:Pink] ?Granulation Quality: [1:Small (1-33%)] [15:Small (1-33%)] [16:Large (67-100%)] ?Necrotic A mount: ?[1:Fat Layer (Subcutaneous Tissue): Yes Fat Layer (Subcutaneous Tissue): Yes Fat Layer (Subcutaneous Tissue): Yes] ?Exposed Structures: ?[1:Fascia: No Tendon: No Muscle: No Joint: No Bone: No Small  (1-33%)] [15:Fascia: No Tendon: No Muscle: No Joint: No Bone: No Medium (34-66%)] [16:Fascia: No Tendon: No Muscle: No Joint: No Bone: No Small (1-33%)] ?Epithelialization: [1:Debridement - Excisional] [15:Debridement - Selective/Open Wound Debridement - Excisional] ?Debridement: ?Pre-procedure Verification/Time Out 14:00 [15:14:00] [16:14:00] ?Taken: [1:Callus, Subcutaneous, Slough] [15:Callus, Slough] [16:Callus, Subcutaneous, Slough] ?Tissue Debrided: [1:Skin/Subcutaneous Tissue] [15:Non-Viable Tissue] [16:Skin/Subcutaneous Tissue] ?Level: [1:1.05] [15:3.84] [16:5.4] ?Debridement A (sq cm): [1:rea Curette] [15:Curette] [16:Curette] ?Instrument: [1:Minimum] [15:Minimum] [16:Minimum] ?Bleeding: [1:Pressure] [15:Pressure] [16:Pressure] ?Hemostasis A chieved: [1:Insensate] [15:Insensate] [16:Insensate] ?Procedural Pain: [1:Insensate] [15:Insensate] [16:Insensate] ?Post Procedural Pain: [1:Procedure was tolerated well] [15:Procedure was tolerated well] [16:Procedure was tolerated well] ?Debridement Treatment Response: [1:1.5x0.7x1.5] [15:3.2x1.2x0.1] [16:2.7x2x0.1] ?Post Debridement Measurements L x ?W x D (cm) [1:1.237] [15:0.302] [16:0.424] ?Post Debridement Volume: (cm?) [1:Category/Stage IV] [15:Category/Stage II] [16:N/A] ?Post Debridement Stage: [1:Debridement] [15:Debridement] [16:Debridement] ?Treatment Notes ?Electronic Signature(s) ?Signed: 06/20/2021 2:28:18 PM By: Fredirick Maudlin MD FACS ?Signed: 06/20/2021 5:13:15 PM By: Baruch Gouty RN, BSN ?Entered By: Fredirick Maudlin on 06/20/2021 14:28:17 ?-------------------------------------------------------------------------------- ?Multi-Disciplinary Care Plan Details ?Patient Name: ?Date of Service: ?Leask, CRYSTA L A. 06/20/2021 1:15 PM ?Medical Record Number: [id number] ?Patient Account Number: [id number] ?Date of Birth/Sex: ?Treating RN: ?[birth year] (41 y.o. F) Boehlein, [name] ?Primary Care Traniece Boffa: [name] ?Other Clinician: ?Referring  Analisia Kingsford: ?Treating Roanna Reaves/Extender: Fredirick Maudlin ?[name] ?Weeks in Treatment: 259 ?Multidisciplinary Care Plan reviewed with physician ?Active Inactive ?Pressure ?Nursing Diagnoses: ?Knowledge deficit related to management

## 2021-06-28 ENCOUNTER — Ambulatory Visit: Payer: Medicaid Other

## 2021-06-28 DIAGNOSIS — M25661 Stiffness of right knee, not elsewhere classified: Secondary | ICD-10-CM | POA: Diagnosis not present

## 2021-06-28 DIAGNOSIS — M25662 Stiffness of left knee, not elsewhere classified: Secondary | ICD-10-CM
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
## 2021-07-03 ENCOUNTER — Telehealth: Payer: Medicaid Other | Admitting: *Deleted

## 2021-07-03 NOTE — Telephone Encounter (Signed)
Prior authorization submitted to [facility] for oxycodone 10 mg #90 ?

## 2021-07-04 ENCOUNTER — Encounter (HOSPITAL_BASED_OUTPATIENT_CLINIC_OR_DEPARTMENT_OTHER): Payer: Medicaid Other | Attending: General Surgery | Admitting: General Surgery

## 2021-07-04 DIAGNOSIS — G8222 Paraplegia, incomplete: Secondary | ICD-10-CM | POA: Diagnosis not present

## 2021-07-04 DIAGNOSIS — L89154 Pressure ulcer of sacral region, stage 4: Secondary | ICD-10-CM | POA: Diagnosis not present

## 2021-07-04 DIAGNOSIS — L97811 Non-pressure chronic ulcer of other part of right lower leg limited to breakdown of skin: Secondary | ICD-10-CM | POA: Insufficient documentation

## 2021-07-04 DIAGNOSIS — Z9049 Acquired absence of other specified parts of digestive tract: Secondary | ICD-10-CM | POA: Diagnosis not present

## 2021-07-04 DIAGNOSIS — L89629 Pressure ulcer of left heel, unspecified stage: Secondary | ICD-10-CM | POA: Insufficient documentation

## 2021-07-04 DIAGNOSIS — L89623 Pressure ulcer of left heel, stage 3: Secondary | ICD-10-CM | POA: Diagnosis not present

## 2021-07-04 NOTE — Progress Notes (Signed)
Makinley, Muscato Levora A. ([id number]) ?Visit Report for 07/04/2021 ?Arrival Information Details ?Patient Name: Date of Service: ?Vanderlinde, CRYSTA L A. 07/04/2021 12:30 PM ?Medical Record Number: [id number] ?Patient Account Number: [id number] ?Date of Birth/Sex: Treating RN: ?1980/09/29 (41 y.o. Benjamine Sprague, S[place] ?Primary Care Stran Raper: [name] Other Clinician: ?Referring Azelia Reiger: ?Treating Debar Plate/Extender: Fredirick Maudlin ?[name] ?Weeks in Treatment: 261 ?Visit Information History Since Last Visit ?Added or deleted any medications: No ?Patient Arrived: Wheel Chair ?Any new allergies or adverse reactions: No ?Arrival Time: 12:55 ?Had a fall or experienced change in No ?Accompanied By: caregiver ?activities of daily living that may affect ?Transfer Assistance: [employer] ?risk of falls: ?Patient Identification Verified: Yes ?Signs or symptoms of abuse/neglect since last visito No ?Secondary Verification Process Completed: Yes ?Hospitalized since last visit: No ?Patient Requires Transmission-Based Precautions: No ?Implantable device outside of the clinic excluding No ?Patient Has Alerts: No ?cellular tissue based products placed in the center ?since last visit: ?Has Dressing in Place as Prescribed: Yes ?Pain Present Now: No ?Electronic Signature(s) ?Signed: 07/04/2021 6:01:57 PM By: Levan Hurst RN, BSN ?Entered By: Levan Hurst on 07/04/2021 13:10:52 ?-------------------------------------------------------------------------------- ?Encounter Discharge Information Details ?Patient Name: Date of Service: ?Jewel, CRYSTA L A. 07/04/2021 12:30 PM ?Medical Record Number: [id number] ?Patient Account Number: [id number] ?Date of Birth/Sex: Treating RN: ?1980/04/08 (41 y.o. F) Boehlein, [name] ?Primary Care Shar Paez: [name] Other Clinician: ?Referring Annalaya Wile: ?Treating Shaquille Janes/Extender: Fredirick Maudlin ?[name] ?Weeks in Treatment: 261 ?Encounter Discharge Information Items Post Procedure  Vitals ?Discharge Condition: Stable ?Temperature (F): 97.9 ?Ambulatory Status: Wheelchair ?Pulse (bpm): 85 ?Discharge Destination: Home ?Respiratory Rate (breaths/min): 18 ?Transportation: Other ?Blood Pressure (mmHg): 115/78 ?Accompanied By: caregiver ?Schedule Follow-up Appointment: Yes ?Clinical Summary of Care: Patient Declined ?Notes ?transportation service ?Electronic Signature(s) ?Signed: 07/04/2021 6:07:39 PM By: Baruch Gouty RN, BSN ?Entered By: Baruch Gouty on 07/04/2021 13:49:11 ?-------------------------------------------------------------------------------- ?Lower Extremity Assessment Details ?Patient Name: ?Date of Service: ?Giaimo, CRYSTA L A. 07/04/2021 12:30 PM ?Medical Record Number: [id number] ?Patient Account Number: [id number] ?Date of Birth/Sex: ?Treating RN: ?June 20, 1980 (41 y.o. Benjamine Sprague, Shatara ?Primary Care Jovanka Westgate: [name] ?Other Clinician: ?Referring Ziyah Cordoba: ?Treating Ilithyia Titzer/Extender: Fredirick Maudlin ?[name] ?Weeks in Treatment: 261 ?Edema Assessment ?Assessed: [Left: No] [Right: No] ?Edema: [Left: Ye] [Right: s] ?Calf ?Left: Right: ?Point of Measurement: From Medial Instep 39 cm ?Ankle ?Left: Right: ?Point of Measurement: From Medial Instep 22 cm ?Vascular Assessment ?Pulses: ?Dorsalis Pedis ?Palpable: [Left:Yes] ?Electronic Signature(s) ?Signed: 07/04/2021 6:01:57 PM By: Levan Hurst RN, BSN ?Entered By: Levan Hurst on 07/04/2021 13:11:25 ?-------------------------------------------------------------------------------- ?Multi Wound Chart Details ?Patient Name: ?Date of Service: ?[name], CRYSTA L A. 07/04/2021 12:30 PM ?Medical Record Number: [id number] ?Patient Account Number: [id number] ?Date of Birth/Sex: ?Treating RN: ?[birth year] (40 y.o. F) Boehlein, [name] ?Primary Care Sulema Braid: [name] ?Other Clinician: ?Referring Dianely Krehbiel: ?Treating Tia Hieronymus/Extender: Fredirick Maudlin ?[name] ?Weeks in Treatment: 261 ?Vital Signs ?Height(in): 65 ?Pulse(bpm):  85 ?Weight(lbs): 201 ?Blood Pressure(mmHg): 115/78 ?Body Mass Index(BMI): 33.4 ?Temperature(??F): 97.9 ?Respiratory Rate(breaths/min): 18 ?Photos: [1:Medial Sacrum] [15:Left Calcaneus] [16:Right, Posterior Upper Leg] ?Wound Location: [1:Pressure Injury] [15:Trauma] [16:Skin Tear/Laceration] ?Wounding Event: [1:Pressure Ulcer] [15:Pressure Ulcer] [16:Skin Tear] ?Primary Etiology: [1:Anemia, Osteomyelitis] [15:Anemia, Osteomyelitis] [16:Anemia, Osteomyelitis] ?Comorbid History: [1:05/15/2016] [15:05/12/2021] [16:05/30/2021] ?Date Acquired: [1:261] [15:4] [16:4] ?Weeks of Treatment: [1:Open] [15:Open] [16:Open] ?Wound Status: [1:No] [15:No] [16:No] ?Wound Recurrence: [1:1.7x0.5x1.8] [15:2.4x0.6x0.1] [16:2.8x2.3x0.1] ?Measurements L x W x D (cm) [1:0.668] [15:1.131] [16:5.058] ?A (cm?) : ?rea [1:1.202] [15:0.113] [67:1.245] ?Volume (cm?) : [1:98.00%] [15:79.40%] [16:-209.50%] ?% Reduction in A rea: [1:99.20%] [15:79.50%] [16:-210.40%] ?% Reduction in Volume: [1:1] ?Starting Position  1 (o'clock): [1:4] ?Ending Position 1 (o'clock): [1:1.9] ?Maximum Distance 1 (cm): [1:Yes] [15:No] [16:No] ?Undermining: [1:Category/Stage IV] [15:Category/Stage II] [16:Full Thickness Without Exposed] ?Classification: [1:Medium] [15:Medium] [16:Support Structures Medium] ?Exudate A mount: [1:Serosanguineous] [15:Serosanguineous] [16:Serosanguineous] ?Exudate Type: [1:red, brown] [15:red, brown] [16:red, brown] ?Exudate Color: [1:Thickened] [15:Distinct, outline attached] [16:Flat and Intact] ?Wound Margin: [1:Large (67-100%)] [15:Large (67-100%)] [16:Small (1-33%)] ?Granulation A mount: [1:Pink, Pale] [15:Red, Pink] [16:Pink, Pale] ?Granulation Quality: [1:Small (1-33%)] [15:Small (1-33%)] [16:Large (67-100%)] ?Necrotic A mount: ?[1:Fat Layer (Subcutaneous Tissue): Yes Fat Layer (Subcutaneous Tissue): Yes Fat Layer (Subcutaneous Tissue): Yes] ?Exposed Structures: ?[1:Fascia: No Tendon: No Muscle: No Joint: No Bone: No Small (1-33%)]  [15:Fascia: No Tendon: No Muscle: No Joint: No Bone: No Medium (34-66%)] [16:Fascia: No Tendon: No Muscle: No Joint: No Bone: No Small (1-33%)] ?Epithelialization: [1:Debridement - Excisional] [15:N/A] [16:Debridement - Excisional] ?Debridement: ?Pre-procedure Verification/Time Out 13:20 [15:N/A] [16:13:20] ?Taken: [1:Other] [15:N/A] [16:Other] ?Pain Control: [1:Subcutaneous, Slough] [15:N/A] [16:Subcutaneous, Slough] ?Tissue Debrided: [1:Skin/Subcutaneous Tissue] [15:N/A] [16:Skin/Subcutaneous Tissue] ?Level: [1:0.85] [15:N/A] [16:6.44] ?Debridement A (sq cm): [1:rea Curette] [15:N/A] [16:Curette] ?Instrument: [1:Minimum] [15:N/A] [16:Minimum] ?Bleeding: [1:Pressure] [15:N/A] [16:Pressure] ?Hemostasis A chieved: [1:0] [15:N/A] [16:0] ?Procedural Pain: [1:0] [15:N/A] [16:0] ?Post Procedural Pain: [1:Procedure was tolerated well] [15:N/A] [16:Procedure was tolerated well] ?Debridement Treatment Response: [1:1.7x0.5x1.8] [15:N/A] [16:2.8x2.3x0.1] ?Post Debridement Measurements L x ?W x D (cm) [1:1.202] [15:N/A] [86:7.619] ?Post Debridement Volume: (cm?) [1:Category/Stage IV] [15:N/A] [16:N/A] ?Post Debridement Stage: [1:Debridement] [15:N/A] [16:Debridement] ?Treatment Notes ?Electronic Signature(s) ?Signed: 07/04/2021 1:44:16 PM By: Fredirick Maudlin MD FACS ?Signed: 07/04/2021 6:07:39 PM By: Baruch Gouty RN, BSN ?Entered By: Fredirick Maudlin on 07/04/2021 13:44:16 ?-------------------------------------------------------------------------------- ?Multi-Disciplinary Care Plan Details ?Patient Name: ?Date of Service: ?Durand, CRYSTA L A. 07/04/2021 12:30 PM ?Medical Record Number: [id number] ?Patient Account Number: [id number] ?Date of Birth/Sex: ?Treating RN: ?[birth year] (41 y.o. Benjamine Sprague, Shatara ?Primary Care Lavora Brisbon: [name] ?Other Clinician: ?Referring Latonja Bobeck: ?Treating Tomika Eckles/Extender: Fredirick Maudlin ?[name] ?Weeks in Treatment: 261 ?Multidisciplinary Care Plan reviewed with physician ?Active  Inactive ?Pressure ?Nursing Diagnoses: ?Knowledge deficit related to management of pressures ulcers ?Potential for impaired tissue integrity related to pressure, friction, moisture, and shear ?Goals: ?Patient will remain free fro
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
## 2021-07-08 ENCOUNTER — Ambulatory Visit: Payer: Medicaid Other | Attending: Orthopedic Surgery

## 2021-07-08 DIAGNOSIS — M25661 Stiffness of right knee, not elsewhere classified: Secondary | ICD-10-CM | POA: Insufficient documentation

## 2021-07-08 DIAGNOSIS — M25662 Stiffness of left knee, not elsewhere classified: Secondary | ICD-10-CM | POA: Diagnosis present

## 2021-07-08 NOTE — Telephone Encounter (Signed)
PA still pending 5/8 ?
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
## 2021-07-09 ENCOUNTER — Ambulatory Visit: Payer: Medicaid Other

## 2021-07-09 DIAGNOSIS — M25662 Stiffness of left knee, not elsewhere classified: Secondary | ICD-10-CM | POA: Diagnosis not present

## 2021-07-09 DIAGNOSIS — M25661 Stiffness of right knee, not elsewhere classified: Secondary | ICD-10-CM
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
## 2021-07-12 NOTE — Telephone Encounter (Signed)
[place] approved pharmacy and patient aware.  ?

## 2021-07-18 ENCOUNTER — Encounter (HOSPITAL_BASED_OUTPATIENT_CLINIC_OR_DEPARTMENT_OTHER): Payer: Medicaid Other | Admitting: General Surgery

## 2021-07-18 DIAGNOSIS — L89629 Pressure ulcer of left heel, unspecified stage: Secondary | ICD-10-CM | POA: Diagnosis not present
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
## 2021-07-19 NOTE — Progress Notes (Signed)
LIBIA, FAZZINI ([id number]) Visit Report for 07/18/2021 Arrival Information Details Patient Name: Date of Service: EVELYNNE, SPIERS 07/18/2021 12:30 PM Medical Record Number: [id number] Patient Account Number: [id number] Date of Birth/Sex: Treating RN: 1980/07/19 (41 y.o. [name] Primary Care Eino Whitner: [name] Other Clinician: Referring Maxie Slovacek: Treating Neita Landrigan/Extender: [name] in Treatment: 263 Visit Information History Since Last Visit All ordered tests and consults were completed: Yes Patient Arrived: Wheel Chair Added or deleted any medications: Yes Arrival Time: 12:51 Any new allergies or adverse reactions: No Accompanied By: caregiver Had a fall or experienced change in No Transfer Assistance: [employer] activities of daily living that may affect Patient Identification Verified: Yes risk of falls: Secondary Verification Process Completed: Yes Signs or symptoms of abuse/neglect since last visito No Patient Requires Transmission-Based Precautions: No Hospitalized since last visit: Yes Patient Has Alerts: No Implantable device outside of the clinic excluding No cellular tissue based products placed in the center since last visit: Has Dressing in Place as Prescribed: Yes Pain Present Now: Yes Electronic Signature(s) Signed: 07/18/2021 4:54:14 PM By: Baruch Gouty RN, BSN Entered By: Baruch Gouty on 07/18/2021 12:56:43 -------------------------------------------------------------------------------- Encounter Discharge Information Details Patient Name: Date of Service: [name] 07/18/2021 12:30 PM Medical Record Number: [id number] Patient Account Number: [id number] Date of Birth/Sex: Treating RN: October 09, 1980 (41 y.o. [name] Primary Care Zarion Oliff: [name] Other Clinician: Referring Eulalie Speights: Treating Toi Stelly/Extender: [name] in Treatment:  263 Encounter Discharge Information Items Post Procedure Vitals Discharge Condition: Stable Temperature (F): 97.8 Ambulatory Status: Wheelchair Pulse (bpm): 86 Discharge Destination: Home Respiratory Rate (breaths/min): 18 Transportation: Private Auto Blood Pressure (mmHg): 148/93 Accompanied By: caregiver Schedule Follow-up Appointment: Yes Clinical Summary of Care: Patient Declined Electronic Signature(s) Signed: 07/18/2021 4:54:14 PM By: Baruch Gouty RN, BSN Entered By: Baruch Gouty on 07/18/2021 14:20:05 -------------------------------------------------------------------------------- Lower Extremity Assessment Details Patient Name: Date of Service: JERRICKA, CARVEY A. 07/18/2021 12:30 PM Medical Record Number: [id number] Patient Account Number: [id number] Date of Birth/Sex: Treating RN: 09/02/1980 (41 y.o. [name] Primary Care Laddie Math: [name] Other Clinician: Referring Sameena Artus: Treating Rishika Mccollom/Extender: Trude Mcburney Weeks in Treatment: 263 Edema Assessment Assessed: [Left: No] [Right: No] Edema: [Left: N] [Right: o] Calf Left: Right: Point of Measurement: From Medial Instep 39 cm Ankle Left: Right: Point of Measurement: From Medial Instep 22 cm Vascular Assessment Pulses: Dorsalis Pedis Palpable: [Left:Yes] Electronic Signature(s) Signed: 07/18/2021 4:54:14 PM By: Baruch Gouty RN, BSN Entered By: Baruch Gouty on 07/18/2021 13:08:10 -------------------------------------------------------------------------------- Multi Wound Chart Details Patient Name: Date of Service: [name] 07/18/2021 12:30 PM Medical Record Number: [id number] Patient Account Number: [id number] Date of Birth/Sex: Treating RN: [birth year] (41 y.o. [name] Primary Care Dorice Stiggers: [name] Other Clinician: Referring Raziya Aveni: Treating Johanthan Kneeland/Extender: Trude Mcburney Weeks in Treatment:  263 Vital Signs Height(in): 65 Pulse(bpm): 86 Weight(lbs): 201 Blood Pressure(mmHg): 148/93 Body Mass Index(BMI): 33.4 Temperature(F): 97.8 Respiratory Rate(breaths/min): 18 Photos: Medial Sacrum Left Calcaneus Right, Posterior Upper Leg Wound Location: Pressure Injury Trauma Skin Tear/Laceration Wounding Event: Pressure Ulcer Pressure Ulcer Skin Tear Primary Etiology: Anemia, Osteomyelitis Anemia, Osteomyelitis Anemia, Osteomyelitis Comorbid History: 05/15/2016 05/12/2021 05/30/2021 Date Acquired: 263 6 6 Weeks of Treatment: Open Open Open Wound Status: No No No Wound Recurrence: 2.2x0.9x1.3 0x0x0 2.9x2.5x0.1 Measurements L x W x D (cm) 1.555 0 5.694 A (cm) : rea 2.022 0 0.569 Volume (cm) : 95.30% 100.00% -248.50% % Reduction in A rea: 98.60% 100.00% -249.10% %  Reduction in Volume: 2 Starting Position 1 (o'clock): 4 Ending Position 1 (o'clock): 1.9 Maximum Distance 1 (cm): Yes No No Undermining: Category/Stage IV Category/Stage II Full Thickness Without Exposed Classification: Support Structures Medium None Present Medium Exudate A mount: Serosanguineous N/A Serosanguineous Exudate Type: red, brown N/A red, brown Exudate Color: Thickened N/A Flat and Intact Wound Margin: Large (67-100%) None Present (0%) Small (1-33%) Granulation A mount: Pink, Pale N/A Pink Granulation Quality: Small (1-33%) None Present (0%) Large (67-100%) Necrotic A mount: Fat Layer (Subcutaneous Tissue): Yes Fascia: No Fat Layer (Subcutaneous Tissue): Yes Exposed Structures: Fascia: No Fat Layer (Subcutaneous Tissue): No Fascia: No Tendon: No Tendon: No Tendon: No Muscle: No Muscle: No Muscle: No Joint: No Joint: No Joint: No Bone: No Bone: No Bone: No Small (1-33%) Large (67-100%) Small (1-33%) Epithelialization: Debridement - Excisional N/A Debridement - Excisional Debridement: Pre-procedure Verification/Time Out 13:25 N/A 13:25 Taken: Other N/A Other Pain  Control: Subcutaneous, Slough N/A Subcutaneous, Slough Tissue Debrided: Skin/Subcutaneous Tissue N/A Skin/Subcutaneous Tissue Level: 1.98 N/A 7.25 Debridement A (sq cm): rea Curette N/A Curette Instrument: Minimum N/A Minimum Bleeding: Pressure N/A Pressure Hemostasis A chieved: 0 N/A 0 Procedural Pain: 0 N/A 0 Post Procedural Pain: Procedure was tolerated well N/A Procedure was tolerated well Debridement Treatment Response: 2.2x0.9x1.3 N/A 2.9x2.5x0.1 Post Debridement Measurements L x W x D (cm) 2.022 N/A 0.569 Post Debridement Volume: (cm) Category/Stage IV N/A N/A Post Debridement Stage: Debridement N/A Debridement Procedures Performed: Negative Pressure Wound Therapy Maintenance (NPWT) Treatment Notes Electronic Signature(s) Signed: 07/18/2021 1:39:11 PM By: Fredirick Maudlin MD FACS Signed: 07/18/2021 4:54:14 PM By: Baruch Gouty RN, BSN Entered By: Fredirick Maudlin on 07/18/2021 13:39:10 -------------------------------------------------------------------------------- Multi-Disciplinary Care Plan Details Patient Name: Date of Service: [name] 07/18/2021 12:30 PM Medical Record Number: [id number] Patient Account Number: [id number] Date of Birth/Sex: Treating RN: 04-Feb-1981 (41 y.o. [name] Primary Care Evellyn [name]: [name] Other Clinician: Referring Tasheema Perrone: Treating Rivers Gassmann/Extender: [name] in Treatment: [place] reviewed with physician Active Inactive Pressure Nursing Diagnoses: Knowledge deficit related to management of pressures ulcers Potential for impaired tissue integrity related to pressure, friction, moisture, and shear Goals: Patient will remain free from development of additional pressure ulcers Date Initiated: 06/27/2016 Date Inactivated: 01/17/2019 Target Resolution Date: 12/24/2018 Goal Status: Met Patient/caregiver will verbalize risk factors for pressure  ulcer development Date Initiated: 06/27/2016 Date Inactivated: 07/08/2017 Target Resolution Date: 07/08/2017 Goal Status: Met Patient/caregiver will verbalize understanding of pressure ulcer management Date Initiated: 06/27/2016 Target Resolution Date: 07/25/2021 Goal Status: Active Interventions: Assess: immobility, friction, shearing, incontinence upon admission and as needed Assess offloading mechanisms upon admission and as needed Assess potential for pressure ulcer upon admission and as needed Provide education on pressure ulcers Treatment Activities: Patient referred for pressure reduction/relief devices : 06/27/2016 Pressure reduction/relief device ordered : 06/27/2016 Notes: Wound/Skin Impairment Nursing Diagnoses: Impaired tissue integrity Knowledge deficit related to smoking impact on wound healing Knowledge deficit related to ulceration/compromised skin integrity Goals: Patient will demonstrate a reduced rate of smoking or cessation of smoking Date Initiated: 06/27/2016 Date Inactivated: 01/26/2017 Target Resolution Date: 01/08/2017 Goal Status: Unmet Unmet Reason: pt continues to smoke Patient/caregiver will verbalize understanding of skin care regimen Date Initiated: 06/27/2016 Target Resolution Date: 07/25/2021 Goal Status: Active Ulcer/skin breakdown will have a volume reduction of 50% by week 8 Date Initiated: 06/27/2016 Date Inactivated: 12/11/2016 Target Resolution Date: 07/25/2016 Goal Status: Met Interventions: Assess patient/caregiver ability to obtain necessary supplies Assess patient/caregiver ability to perform ulcer/skin care regimen  upon admission and as needed Assess ulceration(s) every visit Provide education on smoking Provide education on ulcer and skin care Treatment Activities: Skin care regimen initiated : 06/27/2016 Topical wound management initiated : 06/27/2016 Notes: Electronic Signature(s) Signed: 07/18/2021 4:54:14 PM By: Baruch Gouty RN,  BSN Entered By: Baruch Gouty on 07/18/2021 13:17:21 -------------------------------------------------------------------------------- Negative Pressure Wound Therapy Maintenance (NPWT) Details Patient Name: Date of Service: HAEVEN, NICKLE 07/18/2021 12:30 PM Medical Record Number: [id number] Patient Account Number: [id number] Date of Birth/Sex: Treating RN: [birth year] (41 y.o. [name] Primary Care Maurie Olesen: [name] Other Clinician: Referring Ziomara Birenbaum: Treating Julion Gatt/Extender: Trude Mcburney Weeks in Treatment: 263 NPWT Maintenance Performed for: Wound #1 Medial Sacrum Additional Injuries Covered: No Performed By: Baruch Gouty, RN Type: VAC System Coverage Size (sq cm): 1.98 Pressure Type: Constant Pressure Setting: 125 mmHG Drain Type: None Sponge/Dressing Type: Foam, Black Date Initiated: 06/30/2016 Date Suspended: 03/23/2017 Date Resumed: 07/18/2021 Dressing Removed: No Canister Changed: No Dressing Reapplied: No Quantity of Sponges/Gauze Inserted: 1 Respones T Treatment: o good Days On NPWT : 268 Post Procedure Diagnosis Same as Pre-procedure Electronic Signature(s) Signed: 07/18/2021 4:54:14 PM By: Baruch Gouty RN, BSN Entered By: Baruch Gouty on 07/18/2021 13:24:30 -------------------------------------------------------------------------------- Pain Assessment Details Patient Name: Date of Service: [name] 07/18/2021 12:30 PM Medical Record Number: [id number] Patient Account Number: [id number] Date of Birth/Sex: Treating RN: 1980-06-22 (41 y.o. [name] Primary Care Alyah Boehning: [name] Other Clinician: Referring Rinoa Garramone: Treating Yehudis Monceaux/Extender: [name] in Treatment: 263 Active Problems Location of Pain Severity and Description of Pain Patient Has Paino Yes Site Locations Pain Location: Generalized Pain With Dressing Change: No Duration of  the Pain. Constant / Intermittento Constant Rate the pain. Current Pain Level: 7 Least Pain Level: 3 Character of Pain Describe the Pain: Aching Pain Management and Medication Current Pain Management: Medication: Yes Is the Current Pain Management Adequate: Adequate How does your wound impact your activities of daily livingo Sleep: No Bathing: No Appetite: No Relationship With Others: No Bladder Continence: No Emotions: Yes Bowel Continence: No Hobbies: No Toileting: No Dressing: No Electronic Signature(s) Signed: 07/18/2021 4:54:14 PM By: Baruch Gouty RN, BSN Entered By: Baruch Gouty on 07/18/2021 12:58:55 -------------------------------------------------------------------------------- Patient/Caregiver Education Details Patient Name: Date of Service: Karlton Lemon 5/18/2023andnbsp12:30 PM Medical Record Number: [id number] Patient Account Number: [id number] Date of Birth/Gender: Treating RN: 11-Sep-1980 (41 y.o. [name] Primary Care Physician: [name] Other Clinician: Referring Physician: Treating Physician/Extender: [name] in Treatment: 263 Education Assessment Education Provided To: Patient Education Topics Provided Pressure: Methods: Explain/Verbal Responses: Reinforcements needed, State content correctly Wound/Skin Impairment: Methods: Explain/Verbal Responses: Reinforcements needed, State content correctly Electronic Signature(s) Signed: 07/18/2021 4:54:14 PM By: Baruch Gouty RN, BSN Entered By: Baruch Gouty on 07/18/2021 13:17:41 -------------------------------------------------------------------------------- Wound Assessment Details Patient Name: Date of Service: [name] 07/18/2021 12:30 PM Medical Record Number: [id number] Patient Account Number: [id number] Date of Birth/Sex: Treating RN: 09/14/1980 (41 y.o. [name] Primary Care Amel Gianino: [name] Other  Clinician: Referring Falon Flinchum: Treating Maylea Soria/Extender: Trude Mcburney Weeks in Treatment: 263 Wound Status Wound Number: 1 Primary Etiology: Pressure Ulcer Wound Location: Medial Sacrum Wound Status: Open Wounding Event: Pressure Injury Comorbid History: Anemia, Osteomyelitis Date Acquired: 05/15/2016 Weeks Of Treatment: 263 Clustered Wound: No Photos Wound Measurements Length: (cm) 2.2 Width: (cm) 0.9 Depth: (cm) 1.3 Area: (cm) 1.555 Volume: (cm) 2.022 % Reduction in Area: 95.3% % Reduction in Volume: 98.6% Epithelialization: Small (1-33%)  Undermining: Yes Starting Position (o'clock): 2 Ending Position (o'clock): 4 Maximum Distance: (cm) 1.9 Wound Description Classification: Category/Stage IV Wound Margin: Thickened Exudate Amount: Medium Exudate Type: Serosanguineous Exudate Color: red, brown Foul Odor After Cleansing: No Slough/Fibrino Yes Wound Bed Granulation Amount: Large (67-100%) Exposed Structure Granulation Quality: Pink, Pale Fascia Exposed: No Necrotic Amount: Small (1-33%) Fat Layer (Subcutaneous Tissue) Exposed: Yes Necrotic Quality: Adherent Slough Tendon Exposed: No Muscle Exposed: No Joint Exposed: No Bone Exposed: No Treatment Notes Wound #1 (Sacrum) Wound Laterality: Medial Cleanser Soap and Water Discharge Instruction: May shower and wash wound with dial antibacterial soap and water prior to dressing change. Wound Cleanser Discharge Instruction: Cleanse the wound with wound cleanser prior to applying a clean dressing using gauze sponges, not tissue or cotton balls. Peri-Wound Care Topical Primary Dressing VAC Secondary Dressing Secured With Compression Wrap Compression Stockings Add-Ons Electronic Signature(s) Signed: 07/18/2021 4:54:14 PM By: Baruch Gouty RN, BSN Signed: 07/19/2021 8:22:50 AM By: Sandre Kitty Entered By: Sandre Kitty on 07/18/2021  13:13:33 -------------------------------------------------------------------------------- Wound Assessment Details Patient Name: Date of Service: TILLA, WILBORN A. 07/18/2021 12:30 PM Medical Record Number: [id number] Patient Account Number: [id number] Date of Birth/Sex: Treating RN: January 19, 1981 (41 y.o. [name] Primary Care Maurie Olesen: [name] Other Clinician: Referring Ayannah Faddis: Treating Arista Kettlewell/Extender: Trude Mcburney Weeks in Treatment: 263 Wound Status Wound Number: 15 Primary Etiology: Pressure Ulcer Wound Location: Left Calcaneus Wound Status: Open Wounding Event: Trauma Comorbid History: Anemia, Osteomyelitis Date Acquired: 05/12/2021 Weeks Of Treatment: 6 Clustered Wound: No Photos Wound Measurements Length: (cm) Width: (cm) Depth: (cm) Area: (cm) Volume: (cm) 0 % Reduction in Area: 100% 0 % Reduction in Volume: 100% 0 Epithelialization: Large (67-100%) 0 Tunneling: No 0 Undermining: No Wound Description Classification: Category/Stage II Exudate Amount: None Present Foul Odor After Cleansing: No Slough/Fibrino No Wound Bed Granulation Amount: None Present (0%) Exposed Structure Necrotic Amount: None Present (0%) Fascia Exposed: No Fat Layer (Subcutaneous Tissue) Exposed: No Tendon Exposed: No Muscle Exposed: No Joint Exposed: No Bone Exposed: No Electronic Signature(s) Signed: 07/18/2021 4:54:14 PM By: Baruch Gouty RN, BSN Signed: 07/19/2021 8:22:50 AM By: Sandre Kitty Entered By: Sandre Kitty on 07/18/2021 13:10:55 -------------------------------------------------------------------------------- Wound Assessment Details Patient Name: Date of Service: [name] 07/18/2021 12:30 PM Medical Record Number: [id number] Patient Account Number: [id number] Date of Birth/Sex: Treating RN: 12/07/1980 (41 y.o. [name], [name] Primary Care Tukker Byrns: [name] Other Clinician: Referring  Samra Pesch: Treating Spruha Weight/Extender: Trude Mcburney Weeks in Treatment: 263 Wound Status Wound Number: 16 Primary Etiology: Skin Tear Wound Location: Right, Posterior Upper Leg Wound Status: Open Wounding Event: Skin Tear/Laceration Comorbid History: Anemia, Osteomyelitis Date Acquired: 05/30/2021 Weeks Of Treatment: 6 Clustered Wound: No Photos Wound Measurements Length: (cm) 2.9 Width: (cm) 2.5 Depth: (cm) 0.1 Area: (cm) 5.694 Volume: (cm) 0.569 % Reduction in Area: -248.5% % Reduction in Volume: -249.1% Epithelialization: Small (1-33%) Tunneling: No Undermining: No Wound Description Classification: Full Thickness Without Exposed Support Structures Wound Margin: Flat and Intact Exudate Amount: Medium Exudate Type: Serosanguineous Exudate Color: red, brown Foul Odor After Cleansing: No Slough/Fibrino Yes Wound Bed Granulation Amount: Small (1-33%) Exposed Structure Granulation Quality: Pink Fascia Exposed: No Necrotic Amount: Large (67-100%) Fat Layer (Subcutaneous Tissue) Exposed: Yes Necrotic Quality: Adherent Slough Tendon Exposed: No Muscle Exposed: No Joint Exposed: No Bone Exposed: No Treatment Notes Wound #16 (Upper Leg) Wound Laterality: Right, Posterior Cleanser Normal Saline Discharge Instruction: Cleanse the wound with Normal Saline prior to applying a clean dressing using gauze sponges, not  tissue or cotton balls. Soap and Water Discharge Instruction: May shower and wash wound with dial antibacterial soap and water prior to dressing change. Wound Cleanser Discharge Instruction: Cleanse the wound with wound cleanser prior to applying a clean dressing using gauze sponges, not tissue or cotton balls. Peri-Wound Care Topical Primary Dressing Santyl Ointment Discharge Instruction: Apply nickel thick amount to wound bed as instructed Secondary Dressing ABD Pad, 5x9 Discharge Instruction: Apply over primary dressing or foam  border Secured With 55M Medipore H Soft Cloth Surgical T ape, 4 x 10 (in/yd) Discharge Instruction: Secure with tape as directed. Compression Wrap Compression Stockings Add-Ons Electronic Signature(s) Signed: 07/18/2021 4:54:14 PM By: Baruch Gouty RN, BSN Signed: 07/19/2021 8:22:50 AM By: Sandre Kitty Entered By: Sandre Kitty on 07/18/2021 13:11:20 -------------------------------------------------------------------------------- Vitals Details Patient Name: Date of Service: [name] 07/18/2021 12:30 PM Medical Record Number: [id number] Patient Account Number: [id number] Date of Birth/Sex: Treating RN: [birth year] (41 y.o. [name] Primary Care Jaylen Knope: [name] Other Clinician: Referring Yehya Brendle: Treating Shaily Librizzi/Extender: Trude Mcburney Weeks in Treatment: 263 Vital Signs Time Taken: 12:56 Temperature (F): 97.8 Height (in): 65 Pulse (bpm): 86 Weight (lbs): 201 Respiratory Rate (breaths/min): 18 Body Mass Index (BMI): 33.4 Blood Pressure (mmHg): 148/93 Reference Range: 80 - 120 mg / dl Electronic Signature(s) Signed: 07/18/2021 4:54:14 PM By: Baruch Gouty RN, BSN Entered By: Baruch Gouty on 07/18/2021 12:57:03

## 2021-07-25 ENCOUNTER — Encounter (HOSPITAL_BASED_OUTPATIENT_CLINIC_OR_DEPARTMENT_OTHER): Payer: Medicaid Other | Admitting: General Surgery

## 2021-07-25 DIAGNOSIS — L89629 Pressure ulcer of left heel, unspecified stage: Secondary | ICD-10-CM | POA: Diagnosis not present

## 2021-07-25 NOTE — Progress Notes (Signed)
[name], [name] ([id number]) Visit Report for 07/25/2021 Arrival Information Details Patient Name: Date of Service: [name], [name] 07/25/2021 2:00 PM Medical Record Number: [id number] Patient Account Number: [id number] Date of Birth/Sex: Treating RN: 1980-10-15 (41 y.o. [name] Primary Care Nakia Koble: [name] Other Clinician: Referring Genese Quebedeaux: Treating Kayvan Hoefling/Extender: [name] in Treatment: 27 Visit Information History Since Last Visit Added or deleted any medications: No Patient Arrived: Wheel Chair Any new allergies or adverse reactions: No Arrival Time: 14:31 Had a fall or experienced change in No Accompanied By: mother activities of daily living that may affect Transfer Assistance: [name] Lift risk of falls: Patient Identification Verified: Yes Signs or symptoms of abuse/neglect since last visito No Secondary Verification Process Completed: Yes Hospitalized since last visit: No Patient Requires Transmission-Based Precautions: No Implantable device outside of the clinic excluding No Patient Has Alerts: No cellular tissue based products placed in the center since last visit: Has Dressing in Place as Prescribed: Yes Pain Present Now: No Electronic Signature(s) Signed: 07/25/2021 6:06:15 PM By: Baruch Gouty RN, BSN Entered By: Baruch Gouty on 07/25/2021 14:32:08 -------------------------------------------------------------------------------- Encounter Discharge Information Details Patient Name: Date of Service: Jamie Boyden A. 07/25/2021 2:00 PM Medical Record Number: [id number] Patient Account Number: [id number] Date of Birth/Sex: Treating RN: 1980-11-19 (41 y.o. [name] Primary Care Melonee Gerstel: [name] Other Clinician: Referring Jaisean Monteforte: Treating Alejah Aristizabal/Extender: [name] in Treatment: [phone] Encounter Discharge Information Items Post Procedure  Vitals Discharge Condition: Stable Temperature (F): 98.3 Ambulatory Status: Wheelchair Pulse (bpm): 92 Discharge Destination: Home Respiratory Rate (breaths/min): 18 Transportation: Other Blood Pressure (mmHg): 134/96 Accompanied By: mother Schedule Follow-up Appointment: Yes Clinical Summary of Care: Patient Declined Notes transportation service Electronic Signature(s) Signed: 07/25/2021 6:06:15 PM By: Baruch Gouty RN, BSN Entered By: Baruch Gouty on 07/25/2021 16:49:12 -------------------------------------------------------------------------------- Lower Extremity Assessment Details Patient Name: Date of Service: [name], Jamie A. 07/25/2021 2:00 PM Medical Record Number: [id number] Patient Account Number: [id number] Date of Birth/Sex: Treating RN: [birth year] (41 y.o. [name] Primary Care Rina Adney: [name] Other Clinician: Referring Malka Bocek: Treating Nishka Heide/Extender: Trude Mcburney Weeks in Treatment: 264 Edema Assessment Assessed: [Left: Yes] [Right: Yes] Edema: [Left: N] [Right: o] Calf Left: Right: Point of Measurement: From Medial Instep 39 cm Ankle Left: Right: Point of Measurement: From Medial Instep 22 cm Electronic Signature(s) Signed: 07/25/2021 6:06:15 PM By: Baruch Gouty RN, BSN Entered By: Baruch Gouty on 07/25/2021 14:33:02 -------------------------------------------------------------------------------- Multi Wound Chart Details Patient Name: Date of Service: Jamie Boyden A. 07/25/2021 2:00 PM Medical Record Number: [id number] Patient Account Number: [id number] Date of Birth/Sex: Treating RN: 1980-10-30 (41 y.o. [name] Primary Care Catrice Zuleta: [name] Other Clinician: Referring Dalasia Predmore: Treating Nadim Malia/Extender: Trude Mcburney Weeks in Treatment: 264 Vital Signs Height(in): 65 Pulse(bpm): 92 Weight(lbs): 201 Blood Pressure(mmHg): 134/86 Body Mass  Index(BMI): 33.4 Temperature(F): 98.3 Respiratory Rate(breaths/min): 18 Photos: [N/A:N/A] Medial Sacrum Right, Posterior Upper Leg N/A Wound Location: Pressure Injury Skin Tear/Laceration N/A Wounding Event: Pressure Ulcer Skin Tear N/A Primary Etiology: Anemia, Osteomyelitis Anemia, Osteomyelitis N/A Comorbid History: 05/15/2016 05/30/2021 N/A Date Acquired: 264 7 N/A Weeks of Treatment: Open Open N/A Wound Status: No No N/A Wound Recurrence: 1.6x0.6x1.4 2.7x2.3x0.1 N/A Measurements L x W x D (cm) 0.754 4.877 N/A A (cm) : rea 1.056 0.488 N/A Volume (cm) : 97.70% -198.50% N/A % Reduction in A rea: 99.30% -199.40% N/A % Reduction in Volume: 12 Starting Position 1 (o'clock): 6 Ending Position 1 (o'clock): 1.2 Maximum  Distance 1 (cm): Yes No N/A Undermining: Category/Stage IV Full Thickness Without Exposed N/A Classification: Support Structures Medium Medium N/A Exudate A mount: Serosanguineous Serous N/A Exudate Type: red, brown amber N/A Exudate Color: Thickened Flat and Intact N/A Wound Margin: Large (67-100%) Small (1-33%) N/A Granulation A mount: Pink, Pale Pink N/A Granulation Quality: Small (1-33%) Large (67-100%) N/A Necrotic A mount: Fat Layer (Subcutaneous Tissue): Yes Fat Layer (Subcutaneous Tissue): Yes N/A Exposed Structures: Fascia: No Fascia: No Tendon: No Tendon: No Muscle: No Muscle: No Joint: No Joint: No Bone: No Bone: No Small (1-33%) Small (1-33%) N/A Epithelialization: Debridement - Excisional Debridement - Excisional N/A Debridement: Pre-procedure Verification/Time Out 14:55 14:55 N/A Taken: Other Other N/A Pain Control: Subcutaneous, Slough Subcutaneous, Slough N/A Tissue Debrided: Skin/Subcutaneous Tissue Skin/Subcutaneous Tissue N/A Level: 0.96 6.21 N/A Debridement A (sq cm): rea Curette Curette N/A Instrument: Minimum Minimum N/A Bleeding: Pressure Pressure N/A Hemostasis A chieved: 0 0 N/A Procedural  Pain: 0 0 N/A Post Procedural Pain: Procedure was tolerated well Procedure was tolerated well N/A Debridement Treatment Response: 1.6x0.6x1.4 2.7x2.3x0.1 N/A Post Debridement Measurements L x W x D (cm) 1.056 0.488 N/A Post Debridement Volume: (cm) Category/Stage IV N/A N/A Post Debridement Stage: Debridement Debridement N/A Procedures Performed: Negative Pressure Wound Therapy Maintenance (NPWT) Treatment Notes Electronic Signature(s) Signed: 07/25/2021 3:01:49 PM By: Fredirick Maudlin MD FACS Signed: 07/25/2021 6:06:15 PM By: Baruch Gouty RN, BSN Entered By: Fredirick Maudlin on 07/25/2021 15:01:49 -------------------------------------------------------------------------------- Multi-Disciplinary Care Plan Details Patient Name: Date of Service: Jamie Boyden A. 07/25/2021 2:00 PM Medical Record Number: [id number] Patient Account Number: [id number] Date of Birth/Sex: Treating RN: 02/26/1981 (41 y.o. [name] Primary Care Minnette Merida: [name] Other Clinician: Referring Misa Fedorko: Treating Amabel Stmarie/Extender: [name] in Treatment: [place] reviewed with physician Active Inactive Pressure Nursing Diagnoses: Knowledge deficit related to management of pressures ulcers Potential for impaired tissue integrity related to pressure, friction, moisture, and shear Goals: Patient will remain free from development of additional pressure ulcers Date Initiated: 06/27/2016 Date Inactivated: 01/17/2019 Target Resolution Date: 12/24/2018 Goal Status: Met Patient/caregiver will verbalize risk factors for pressure ulcer development Date Initiated: 06/27/2016 Date Inactivated: 07/08/2017 Target Resolution Date: 07/08/2017 Goal Status: Met Patient/caregiver will verbalize understanding of pressure ulcer management Date Initiated: 06/27/2016 Target Resolution Date: 08/22/2021 Goal Status: Active Interventions: Assess:  immobility, friction, shearing, incontinence upon admission and as needed Assess offloading mechanisms upon admission and as needed Assess potential for pressure ulcer upon admission and as needed Provide education on pressure ulcers Treatment Activities: Patient referred for pressure reduction/relief devices : 06/27/2016 Pressure reduction/relief device ordered : 06/27/2016 Notes: Wound/Skin Impairment Nursing Diagnoses: Impaired tissue integrity Knowledge deficit related to smoking impact on wound healing Knowledge deficit related to ulceration/compromised skin integrity Goals: Patient will demonstrate a reduced rate of smoking or cessation of smoking Date Initiated: 06/27/2016 Date Inactivated: 01/26/2017 Target Resolution Date: 01/08/2017 Goal Status: Unmet Unmet Reason: pt continues to smoke Patient/caregiver will verbalize understanding of skin care regimen Date Initiated: 06/27/2016 Target Resolution Date: 08/22/2021 Goal Status: Active Ulcer/skin breakdown will have a volume reduction of 50% by week 8 Date Initiated: 06/27/2016 Date Inactivated: 12/11/2016 Target Resolution Date: 07/25/2016 Goal Status: Met Interventions: Assess patient/caregiver ability to obtain necessary supplies Assess patient/caregiver ability to perform ulcer/skin care regimen upon admission and as needed Assess ulceration(s) every visit Provide education on smoking Provide education on ulcer and skin care Treatment Activities: Skin care regimen initiated : 06/27/2016 Topical wound management initiated : 06/27/2016 Notes: Electronic Signature(s)  Signed: 07/25/2021 6:06:15 PM By: Baruch Gouty RN, BSN Entered By: Baruch Gouty on 07/25/2021 14:53:07 -------------------------------------------------------------------------------- Negative Pressure Wound Therapy Maintenance (NPWT) Details Patient Name: Date of Service: [name], [name] 07/25/2021 2:00 PM Medical Record Number: [id number] Patient  Account Number: [id number] Date of Birth/Sex: Treating RN: [birth year] (41 y.o. [name] Primary Care Nilah Belcourt: [name] Other Clinician: Referring Pietrina Jagodzinski: Treating Tatym Schermer/Extender: Trude Mcburney Weeks in Treatment: 264 NPWT Maintenance Performed for: Wound #1 Medial Sacrum Additional Injuries Covered: No Performed By: Baruch Gouty, RN Type: VAC System Coverage Size (sq cm): 0.96 Pressure Type: Constant Pressure Setting: 125 mmHG Drain Type: None Sponge/Dressing Type: Foam, Black Date Initiated: 06/30/2016 Dressing Removed: No Quantity of Sponges/Gauze Removed: 2 Canister Changed: No Dressing Reapplied: No Quantity of Sponges/Gauze Inserted: 2 Respones T Treatment: o good Days On NPWT : 275 Post Procedure Diagnosis Same as Pre-procedure Electronic Signature(s) Signed: 07/25/2021 6:06:15 PM By: Baruch Gouty RN, BSN Entered By: Baruch Gouty on 07/25/2021 14:58:18 -------------------------------------------------------------------------------- Pain Assessment Details Patient Name: Date of Service: Jamie Boyden A. 07/25/2021 2:00 PM Medical Record Number: [id number] Patient Account Number: [id number] Date of Birth/Sex: Treating RN: 11-29-1980 (41 y.o. [name] Primary Care Waylynn Benefiel: [name] Other Clinician: Referring Iliyah Bui: Treating Darneshia Demary/Extender: [name] in Treatment: 264 Active Problems Location of Pain Severity and Description of Pain Patient Has Paino Yes Site Locations Pain Location: Generalized Pain With Dressing Change: No Duration of the Pain. Constant / Intermittento Intermittent Rate the pain. Current Pain Level: 0 Worst Pain Level: 7 Least Pain Level: 0 Character of Pain Describe the Pain: Aching Pain Management and Medication Current Pain Management: Medication: Yes Is the Current Pain Management Adequate: Adequate How does your wound impact  your activities of daily livingo Sleep: No Bathing: No Appetite: No Relationship With Others: No Bladder Continence: No Emotions: No Bowel Continence: No Work: No Toileting: No Drive: No Dressing: No Hobbies: No Engineer, maintenance) Signed: 07/25/2021 6:06:15 PM By: Baruch Gouty RN, BSN Entered By: Baruch Gouty on 07/25/2021 14:32:56 -------------------------------------------------------------------------------- Patient/Caregiver Education Details Patient Name: Date of Service: [name] 5/25/2023andnbsp2:00 PM Medical Record Number: [id number] Patient Account Number: [id number] Date of Birth/Gender: Treating RN: [birth year] (41 y.o. [name] Primary Care Physician: [name] Other Clinician: Referring Physician: Treating Physician/Extender: [name] in Treatment: [phone] Education Assessment Education Provided To: Patient Education Topics Provided Pressure: Methods: Explain/Verbal Responses: Reinforcements needed, State content correctly Wound/Skin Impairment: Methods: Explain/Verbal Responses: Reinforcements needed, State content correctly Electronic Signature(s) Signed: 07/25/2021 6:06:15 PM By: Baruch Gouty RN, BSN Entered By: Baruch Gouty on 07/25/2021 14:53:27 -------------------------------------------------------------------------------- Wound Assessment Details Patient Name: Date of Service: Jamie Boyden A. 07/25/2021 2:00 PM Medical Record Number: [id number] Patient Account Number: [id number] Date of Birth/Sex: Treating RN: 1980-05-12 (41 y.o. [name] Primary Care Mekala Winger: [name] Other Clinician: Referring Jesly Hartmann: Treating Makya Phillis/Extender: Trude Mcburney Weeks in Treatment: 264 Wound Status Wound Number: 1 Primary Etiology: Pressure Ulcer Wound Location: Medial Sacrum Wound Status: Open Wounding Event: Pressure Injury Comorbid History:  Anemia, Osteomyelitis Date Acquired: 05/15/2016 Weeks Of Treatment: 264 Clustered Wound: No Photos Wound Measurements Length: (cm) 1.6 Width: (cm) 0.6 Depth: (cm) 1.4 Area: (cm) 0.754 Volume: (cm) 1.056 % Reduction in Area: 97.7% % Reduction in Volume: 99.3% Epithelialization: Small (1-33%) Tunneling: No Undermining: Yes Starting Position (o'clock): 12 Ending Position (o'clock): 6 Maximum Distance: (cm) 1.2 Wound Description Classification: Category/Stage IV Wound Margin: Thickened Exudate Amount: Medium Exudate Type: Serosanguineous  Exudate Color: red, brown Foul Odor After Cleansing: No Slough/Fibrino Yes Wound Bed Granulation Amount: Large (67-100%) Exposed Structure Granulation Quality: Pink, Pale Fascia Exposed: No Necrotic Amount: Small (1-33%) Fat Layer (Subcutaneous Tissue) Exposed: Yes Necrotic Quality: Adherent Slough Tendon Exposed: No Muscle Exposed: No Joint Exposed: No Bone Exposed: No Treatment Notes Wound #1 (Sacrum) Wound Laterality: Medial Cleanser Soap and Water Discharge Instruction: May shower and wash wound with dial antibacterial soap and water prior to dressing change. Wound Cleanser Discharge Instruction: Cleanse the wound with wound cleanser prior to applying a clean dressing using gauze sponges, not tissue or cotton balls. Peri-Wound Care Topical Primary Dressing VAC Secondary Dressing Secured With Compression Wrap Compression Stockings Add-Ons Electronic Signature(s) Signed: 07/25/2021 6:06:15 PM By: Baruch Gouty RN, BSN Entered By: Baruch Gouty on 07/25/2021 14:50:46 -------------------------------------------------------------------------------- Wound Assessment Details Patient Name: Date of Service: Jamie Boyden A. 07/25/2021 2:00 PM Medical Record Number: [id number] Patient Account Number: [id number] Date of Birth/Sex: Treating RN: 07/18/1980 (41 y.o. [name], [name] Primary Care Bricyn Labrada: [name] Other Clinician: Referring Yoshika Vensel: Treating Oslo Huntsman/Extender: Trude Mcburney Weeks in Treatment: 264 Wound Status Wound Number: 16 Primary Etiology: Skin Tear Wound Location: Right, Posterior Upper Leg Wound Status: Open Wounding Event: Skin Tear/Laceration Comorbid History: Anemia, Osteomyelitis Date Acquired: 05/30/2021 Weeks Of Treatment: 7 Clustered Wound: No Photos Wound Measurements Length: (cm) 2.7 Width: (cm) 2.3 Depth: (cm) 0.1 Area: (cm) 4.877 Volume: (cm) 0.488 % Reduction in Area: -198.5% % Reduction in Volume: -199.4% Epithelialization: Small (1-33%) Tunneling: No Undermining: No Wound Description Classification: Full Thickness Without Exposed Support Structures Wound Margin: Flat and Intact Exudate Amount: Medium Exudate Type: Serous Exudate Color: amber Foul Odor After Cleansing: No Slough/Fibrino Yes Wound Bed Granulation Amount: Small (1-33%) Exposed Structure Granulation Quality: Pink Fascia Exposed: No Necrotic Amount: Large (67-100%) Fat Layer (Subcutaneous Tissue) Exposed: Yes Necrotic Quality: Adherent Slough Tendon Exposed: No Muscle Exposed: No Joint Exposed: No Bone Exposed: No Treatment Notes Wound #16 (Upper Leg) Wound Laterality: Right, Posterior Cleanser Normal Saline Discharge Instruction: Cleanse the wound with Normal Saline prior to applying a clean dressing using gauze sponges, not tissue or cotton balls. Soap and Water Discharge Instruction: May shower and wash wound with dial antibacterial soap and water prior to dressing change. Wound Cleanser Discharge Instruction: Cleanse the wound with wound cleanser prior to applying a clean dressing using gauze sponges, not tissue or cotton balls. Peri-Wound Care Topical Primary Dressing Santyl Ointment Discharge Instruction: Apply nickel thick amount to wound bed as instructed Secondary Dressing ABD Pad, 5x9 Discharge Instruction: Apply over primary  dressing or foam border Secured With 41M Medipore H Soft Cloth Surgical T ape, 4 x 10 (in/yd) Discharge Instruction: Secure with tape as directed. Compression Wrap Compression Stockings Add-Ons Electronic Signature(s) Signed: 07/25/2021 6:06:15 PM By: Baruch Gouty RN, BSN Entered By: Baruch Gouty on 07/25/2021 14:51:10 -------------------------------------------------------------------------------- Vitals Details Patient Name: Date of Service: Jamie Boyden A. 07/25/2021 2:00 PM Medical Record Number: [id number] Patient Account Number: [id number] Date of Birth/Sex: Treating RN: Feb 12, 1981 (41 y.o. [name] Primary Care Sekai Nayak: [name] Other Clinician: Referring Charlen Bakula: Treating Cambria Osten/Extender: [name] in Treatment: 264 Vital Signs Time Taken: 14:32 Temperature (F): 98.3 Height (in): 65 Pulse (bpm): 92 Weight (lbs): 201 Respiratory Rate (breaths/min): 18 Body Mass Index (BMI): 33.4 Blood Pressure (mmHg): 134/86 Reference Range: 80 - 120 mg / dl Electronic Signature(s) Signed: 07/25/2021 6:06:15 PM By: Baruch Gouty RN, BSN Entered By: Baruch Gouty on 07/25/2021 14:32:30
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
## 2021-07-31 ENCOUNTER — Telehealth: Payer: Self-pay | Admitting: Family Medicine

## 2021-07-31 DIAGNOSIS — Z79899 Other long term (current) drug therapy: Secondary | ICD-10-CM

## 2021-07-31 DIAGNOSIS — G8929 Other chronic pain: Secondary | ICD-10-CM

## 2021-07-31 NOTE — Telephone Encounter (Signed)
  Prescription Request  07/31/2021  Is this a "Controlled Substance" medicine? yes  Have you seen your PCP in the last 2 weeks? No, next appt 6/28  If YES, route message to pool  -  If NO, patient needs to be scheduled for appointment.  What is the name of the medication or equipment? Oxycodone HCl 10 MG TABS  Have you contacted your pharmacy to request a refill? Yes, they are out at this time    Which pharmacy would you like this sent to? [facility]   Patient notified that their request is being sent to the clinical staff for review and that they should receive a response within 2 business days.

## 2021-08-01 ENCOUNTER — Encounter (HOSPITAL_BASED_OUTPATIENT_CLINIC_OR_DEPARTMENT_OTHER): Payer: Medicaid Other | Attending: General Surgery | Admitting: General Surgery

## 2021-08-01 ENCOUNTER — Other Ambulatory Visit (HOSPITAL_COMMUNITY): Payer: Self-pay

## 2021-08-01 DIAGNOSIS — Z9049 Acquired absence of other specified parts of digestive tract: Secondary | ICD-10-CM | POA: Insufficient documentation

## 2021-08-01 DIAGNOSIS — L97811 Non-pressure chronic ulcer of other part of right lower leg limited to breakdown of skin: Secondary | ICD-10-CM | POA: Diagnosis not present

## 2021-08-01 DIAGNOSIS — L89154 Pressure ulcer of sacral region, stage 4: Secondary | ICD-10-CM | POA: Diagnosis present

## 2021-08-01 DIAGNOSIS — G8222 Paraplegia, incomplete: Secondary | ICD-10-CM | POA: Diagnosis not present

## 2021-08-01 MED ORDER — OXYCODONE HCL 10 MG PO TABS: 10.0000 mg | ORAL_TABLET | Freq: Three times a day (TID) | ORAL | 0 refills | Status: DC

## 2021-08-01 NOTE — Telephone Encounter (Signed)
It has been sent.  Please make patient aware and also call [facility] in [place] and cancel the remaining oxycodone prescription.

## 2021-08-01 NOTE — Telephone Encounter (Signed)
Is she saying [facility] it?

## 2021-08-01 NOTE — Telephone Encounter (Signed)
Pt mother aware. [facility] [place] has been [facility]

## 2021-08-01 NOTE — Telephone Encounter (Signed)
Yes, [facility] [place] [facility] it on [facility]order, she checked [facility] and they have it.  Could it be transferred to [place] this one time?

## 2021-08-05 NOTE — Progress Notes (Signed)
[name] ([id number]) Visit Report for 08/01/2021 Arrival Information Details Patient Name: Date of Service: [name], [name] 08/01/2021 2:00 PM Medical Record Number: [id number] Patient Account Number: [id number] Date of Birth/Sex: Treating RN: [birth year] (41 y.o. [name] Primary Care Telsa Dillavou: [name] Other Clinician: Referring Heidi Maclin: Treating Adetokunbo Mccadden/Extender: [name] in Treatment: 92 Visit Information History Since Last Visit Added or deleted any medications: No Patient Arrived: Wheel Chair Any new allergies or adverse reactions: No Arrival Time: 14:50 Had a fall or experienced change in No Accompanied By: family member activities of daily living that may affect Transfer Assistance: Harrel Lemon Lift risk of falls: Patient Identification Verified: Yes Signs or symptoms of abuse/neglect since last visito No Secondary Verification Process Completed: Yes Hospitalized since last visit: No Patient Requires Transmission-Based Precautions: No Implantable device outside of the clinic excluding No Patient Has Alerts: No cellular tissue based products placed in the center since last visit: Has Dressing in Place as Prescribed: Yes Pain Present Now: No Electronic Signature(s) Signed: 08/01/2021 5:59:26 PM By: Sharyn Creamer RN, BSN Entered By: Sharyn Creamer on 08/01/2021 14:51:14 -------------------------------------------------------------------------------- Encounter Discharge Information Details Patient Name: Date of Service: Jamie Boyden A. 08/01/2021 2:00 PM Medical Record Number: [id number] Patient Account Number: [id number] Date of Birth/Sex: Treating RN: 1980-12-04 (40 y.o. [name] Primary Care Gurtaj Ruz: [name] Other Clinician: Referring Vernie Vinciguerra: Treating Alizee Maple/Extender: [name] in Treatment: [phone] Encounter Discharge Information Items Post Procedure  Vitals Discharge Condition: Stable Temperature (F): 98.3 Ambulatory Status: Wheelchair Pulse (bpm): 91 Discharge Destination: Home Respiratory Rate (breaths/min): 18 Transportation: Private Auto Blood Pressure (mmHg): 133/89 Accompanied By: mother Schedule Follow-up Appointment: Yes Clinical Summary of Care: Patient Declined Electronic Signature(s) Signed: 08/01/2021 5:59:26 PM By: Sharyn Creamer RN, BSN Entered By: Sharyn Creamer on 08/01/2021 17:49:23 -------------------------------------------------------------------------------- Lower Extremity Assessment Details Patient Name: Date of Service: Jamie Boyden A. 08/01/2021 2:00 PM Medical Record Number: [id number] Patient Account Number: [id number] Date of Birth/Sex: Treating RN: 1980-11-20 (41 y.o. [name] Primary Care Sharine Cadle: [name] Other Clinician: Referring Joeanna Howdyshell: Treating Kiante Petrovich/Extender: Trude Mcburney Weeks in Treatment: 265 Electronic Signature(s) Signed: 08/01/2021 5:59:26 PM By: Sharyn Creamer RN, BSN Entered By: Sharyn Creamer on 08/01/2021 14:51:24 -------------------------------------------------------------------------------- Multi Wound Chart Details Patient Name: Date of Service: Jamie Boyden A. 08/01/2021 2:00 PM Medical Record Number: [id number] Patient Account Number: [id number] Date of Birth/Sex: Treating RN: 02/21/1981 (41 y.o. [name] Primary Care Jalynn Waddell: [name] Other Clinician: Referring Orilla Templeman: Treating Madisan Bice/Extender: Trude Mcburney Weeks in Treatment: 265 Vital Signs Height(in): 65 Pulse(bpm): 91 Weight(lbs): 201 Blood Pressure(mmHg): 133/89 Body Mass Index(BMI): 33.4 Temperature(F): 98.3 Respiratory Rate(breaths/min): 18 Photos: [16:No Photos] [N/A:N/A] Medial Sacrum Right, Posterior Upper Leg N/A Wound Location: Pressure Injury Skin Tear/Laceration N/A Wounding Event: Pressure Ulcer Skin Tear  N/A Primary Etiology: Anemia, Osteomyelitis Anemia, Osteomyelitis N/A Comorbid History: 05/15/2016 05/30/2021 N/A Date Acquired: 265 8 N/A Weeks of Treatment: Open Open N/A Wound Status: No No N/A Wound Recurrence: 1.2x1x1.6 1.6x1.2x0.1 N/A Measurements L x W x D (cm) 0.942 1.508 N/A A (cm) : rea 1.508 0.151 N/A Volume (cm) : 97.10% 7.70% N/A % Reduction in A rea: 99.00% 7.40% N/A % Reduction in Volume: 12 Starting Position 1 (o'clock): 6 Ending Position 1 (o'clock): 2.3 Maximum Distance 1 (cm): Yes No N/A Undermining: Category/Stage IV Full Thickness Without Exposed N/A Classification: Support Structures Medium Medium N/A Exudate Amount: Serosanguineous Serous N/A Exudate Type: red, brown amber N/A Exudate Color:  Thickened Flat and Intact N/A Wound Margin: Large (67-100%) Small (1-33%) N/A Granulation Amount: Pink, Pale Pink N/A Granulation Quality: None Present (0%) Large (67-100%) N/A Necrotic Amount: Fat Layer (Subcutaneous Tissue): Yes Fat Layer (Subcutaneous Tissue): Yes N/A Exposed Structures: Fascia: No Fascia: No Tendon: No Tendon: No Muscle: No Muscle: No Joint: No Joint: No Bone: No Bone: No Small (1-33%) Small (1-33%) N/A Epithelialization: Debridement - Excisional Debridement - Excisional N/A Debridement: Pre-procedure Verification/Time Out 14:58 14:58 N/A Taken: Other Other N/A Pain Control: Subcutaneous Subcutaneous, Slough N/A Tissue Debrided: Skin/Subcutaneous Tissue Skin/Subcutaneous Tissue N/A Level: 1.2 1.92 N/A Debridement A (sq cm): rea Curette Curette N/A Instrument: Minimum Minimum N/A Bleeding: Pressure Pressure N/A Hemostasis A chieved: 0 0 N/A Procedural Pain: 0 0 N/A Post Procedural Pain: Procedure was tolerated well Procedure was tolerated well N/A Debridement Treatment Response: 1.2x1x1.6 1.6x1.2x0.1 N/A Post Debridement Measurements L x W x D (cm) 1.508 0.151 N/A Post Debridement Volume:  (cm) Category/Stage IV N/A N/A Post Debridement Stage: Debridement Debridement N/A Procedures Performed: Negative Pressure Wound Therapy Maintenance (NPWT) Treatment Notes Electronic Signature(s) Signed: 08/01/2021 3:14:45 PM By: Fredirick Maudlin MD FACS Signed: 08/05/2021 5:13:49 PM By: Baruch Gouty RN, BSN Entered By: Fredirick Maudlin on 08/01/2021 15:14:45 -------------------------------------------------------------------------------- Multi-Disciplinary Care Plan Details Patient Name: Date of Service: Jamie Boyden A. 08/01/2021 2:00 PM Medical Record Number: [id number] Patient Account Number: [id number] Date of Birth/Sex: Treating RN: 07/22/1980 (41 y.o. [name] Primary Care Warda Mcqueary: [name] Other Clinician: Referring Laurance Heide: Treating Damisha Wolff/Extender: [name] in Treatment: [place] reviewed with physician Active Inactive Pressure Nursing Diagnoses: Knowledge deficit related to management of pressures ulcers Potential for impaired tissue integrity related to pressure, friction, moisture, and shear Goals: Patient will remain free from development of additional pressure ulcers Date Initiated: 06/27/2016 Date Inactivated: 01/17/2019 Target Resolution Date: 12/24/2018 Goal Status: Met Patient/caregiver will verbalize risk factors for pressure ulcer development Date Initiated: 06/27/2016 Date Inactivated: 07/08/2017 Target Resolution Date: 07/08/2017 Goal Status: Met Patient/caregiver will verbalize understanding of pressure ulcer management Date Initiated: 06/27/2016 Target Resolution Date: 08/22/2021 Goal Status: Active Interventions: Assess: immobility, friction, shearing, incontinence upon admission and as needed Assess offloading mechanisms upon admission and as needed Assess potential for pressure ulcer upon admission and as needed Provide education on pressure ulcers Treatment  Activities: Patient referred for pressure reduction/relief devices : 06/27/2016 Pressure reduction/relief device ordered : 06/27/2016 Notes: Wound/Skin Impairment Nursing Diagnoses: Impaired tissue integrity Knowledge deficit related to smoking impact on wound healing Knowledge deficit related to ulceration/compromised skin integrity Goals: Patient will demonstrate a reduced rate of smoking or cessation of smoking Date Initiated: 06/27/2016 Date Inactivated: 01/26/2017 Target Resolution Date: 01/08/2017 Goal Status: Unmet Unmet Reason: pt continues to smoke Patient/caregiver will verbalize understanding of skin care regimen Date Initiated: 06/27/2016 Target Resolution Date: 08/22/2021 Goal Status: Active Ulcer/skin breakdown will have a volume reduction of 50% by week 8 Date Initiated: 06/27/2016 Date Inactivated: 12/11/2016 Target Resolution Date: 07/25/2016 Goal Status: Met Interventions: Assess patient/caregiver ability to obtain necessary supplies Assess patient/caregiver ability to perform ulcer/skin care regimen upon admission and as needed Assess ulceration(s) every visit Provide education on smoking Provide education on ulcer and skin care Treatment Activities: Skin care regimen initiated : 06/27/2016 Topical wound management initiated : 06/27/2016 Notes: Electronic Signature(s) Signed: 08/01/2021 5:59:26 PM By: Sharyn Creamer RN, BSN Entered By: Sharyn Creamer on 08/01/2021 15:16:50 -------------------------------------------------------------------------------- Negative Pressure Wound Therapy Maintenance (NPWT) Details Patient Name: Date of Service: EVANTHIA, MAUND A. 08/01/2021 2:00 PM  Medical Record Number: [id number] Patient Account Number: [id number] Date of Birth/Sex: Treating RN: 01-17-1981 (41 y.o. [name] Primary Care Camdyn Beske: [name] Other Clinician: Referring Litsy Epting: Treating Zayn Selley/Extender: Trude Mcburney Weeks in  Treatment: 265 NPWT Maintenance Performed for: Wound #1 Medial Sacrum Additional Injuries Covered: No Performed By: Sharyn Creamer, RN Type: VAC System Coverage Size (sq cm): 1.2 Pressure Type: Constant Pressure Setting: 125 mmHG Drain Type: None Primary Contact: Other : Sponge/Dressing Type: Foam, Black Date Initiated: 06/30/2016 Dressing Removed: Yes Quantity of Sponges/Gauze Removed: 1 black Canister Changed: No Dressing Reapplied: Yes Quantity of Sponges/Gauze Inserted: 1 black Respones T Treatment: o pt tolerated well Days On NPWT : 1859 Post Procedure Diagnosis Same as Pre-procedure Electronic Signature(s) Signed: 08/01/2021 5:59:26 PM By: Sharyn Creamer RN, BSN Entered By: Sharyn Creamer on 08/01/2021 15:08:30 -------------------------------------------------------------------------------- Pain Assessment Details Patient Name: Date of Service: Jamie Boyden A. 08/01/2021 2:00 PM Medical Record Number: [id number] Patient Account Number: [id number] Date of Birth/Sex: Treating RN: 30-Jan-1981 (41 y.o. [name] Primary Care Nyriah Coote: [name] Other Clinician: Referring Abbigail Anstey: Treating Janila Arrazola/Extender: Trude Mcburney Weeks in Treatment: 265 Active Problems Location of Pain Severity and Description of Pain Patient Has Paino No Site Locations Pain Management and Medication Current Pain Management: Electronic Signature(s) Signed: 08/01/2021 5:59:26 PM By: Sharyn Creamer RN, BSN Entered By: Sharyn Creamer on 08/01/2021 14:50:42 -------------------------------------------------------------------------------- Patient/Caregiver Education Details Patient Name: Date of Service: Karlton Lemon 6/1/2023andnbsp2:00 PM Medical Record Number: [id number] Patient Account Number: [id number] Date of Birth/Gender: Treating RN: March 18, 1980 (41 y.o. [name] Primary Care Physician: [name] Other Clinician: Referring  Physician: Treating Physician/Extender: [name] in Treatment: [phone] Education Assessment Education Provided To: Patient Education Topics Provided Pressure: Methods: Explain/Verbal Responses: State content correctly Wound/Skin Impairment: Methods: Explain/Verbal Responses: State content correctly Electronic Signature(s) Signed: 08/01/2021 5:59:26 PM By: Sharyn Creamer RN, BSN Entered By: Sharyn Creamer on 08/01/2021 17:48:04 -------------------------------------------------------------------------------- Wound Assessment Details Patient Name: Date of Service: Jamie Boyden A. 08/01/2021 2:00 PM Medical Record Number: [id number] Patient Account Number: [id number] Date of Birth/Sex: Treating RN: 1980-09-20 (41 y.o. [name] Primary Care Winnell Bento: [name] Other Clinician: Referring Kynslee Baham: Treating Porscha Axley/Extender: Trude Mcburney Weeks in Treatment: 265 Wound Status Wound Number: 1 Primary Etiology: Pressure Ulcer Wound Location: Medial Sacrum Wound Status: Open Wounding Event: Pressure Injury Comorbid History: Anemia, Osteomyelitis Date Acquired: 05/15/2016 Weeks Of Treatment: 265 Clustered Wound: No Photos Wound Measurements Length: (cm) 1.2 Width: (cm) 1 Depth: (cm) 1.6 Area: (cm) 0.942 Volume: (cm) 1.508 Wound Description Classification: Category/Stage IV Wound Margin: Thickened Exudate Amount: Medium Exudate Type: Serosanguineous Exudate Color: red, brown Foul Odor After Cleansing: Slough/Fibrino % Reduction in Area: 97.1% % Reduction in Volume: 99% Epithelialization: Small (1-33%) Undermining: Yes Starting Position (o'clock): 12 Ending Position (o'clock): 6 Maximum Distance: (cm) 2.3 No No Wound Bed Granulation Amount: Large (67-100%) Exposed Structure Granulation Quality: Pink, Pale Fascia Exposed: No Necrotic Amount: None Present (0%) Fat Layer (Subcutaneous Tissue) Exposed:  Yes Tendon Exposed: No Muscle Exposed: No Joint Exposed: No Bone Exposed: No Treatment Notes Wound #1 (Sacrum) Wound Laterality: Medial Cleanser Soap and Water Discharge Instruction: May shower and wash wound with dial antibacterial soap and water prior to dressing change. Wound Cleanser Discharge Instruction: Cleanse the wound with wound cleanser prior to applying a clean dressing using gauze sponges, not tissue or cotton balls. Peri-Wound Care Topical Primary Dressing VAC Secondary Dressing Secured With Compression Wrap Compression Stockings Add-Ons Electronic Signature(s)  Signed: 08/01/2021 5:59:26 PM By: Sharyn Creamer RN, BSN Entered By: Sharyn Creamer on 08/01/2021 15:14:30 -------------------------------------------------------------------------------- Wound Assessment Details Patient Name: Date of Service: Jamie Boyden A. 08/01/2021 2:00 PM Medical Record Number: [id number] Patient Account Number: [id number] Date of Birth/Sex: Treating RN: [birth year] (42 y.o. [name] Primary Care Janelle Spellman: [name] Other Clinician: Referring Xiomar Crompton: Treating Railynn Ballo/Extender: Trude Mcburney Weeks in Treatment: 265 Wound Status Wound Number: 16 Primary Etiology: Skin Tear Wound Location: Right, Posterior Upper Leg Wound Status: Open Wounding Event: Skin Tear/Laceration Comorbid History: Anemia, Osteomyelitis Date Acquired: 05/30/2021 Weeks Of Treatment: 8 Clustered Wound: No Photos Wound Measurements Length: (cm) 1.6 Width: (cm) 1.2 Depth: (cm) 0.1 Area: (cm) 1.508 Volume: (cm) 0.151 % Reduction in Area: 7.7% % Reduction in Volume: 7.4% Epithelialization: Small (1-33%) Tunneling: No Undermining: No Wound Description Classification: Full Thickness Without Exposed Support Structures Wound Margin: Flat and Intact Exudate Amount: Medium Exudate Type: Serous Exudate Color: amber Foul Odor After Cleansing: No Slough/Fibrino  Yes Wound Bed Granulation Amount: Small (1-33%) Exposed Structure Granulation Quality: Pink Fascia Exposed: No Necrotic Amount: Large (67-100%) Fat Layer (Subcutaneous Tissue) Exposed: Yes Necrotic Quality: Adherent Slough Tendon Exposed: No Muscle Exposed: No Joint Exposed: No Bone Exposed: No Treatment Notes Wound #16 (Upper Leg) Wound Laterality: Right, Posterior Cleanser Normal Saline Discharge Instruction: Cleanse the wound with Normal Saline prior to applying a clean dressing using gauze sponges, not tissue or cotton balls. Soap and Water Discharge Instruction: May shower and wash wound with dial antibacterial soap and water prior to dressing change. Wound Cleanser Discharge Instruction: Cleanse the wound with wound cleanser prior to applying a clean dressing using gauze sponges, not tissue or cotton balls. Peri-Wound Care Topical Primary Dressing Santyl Ointment Discharge Instruction: Apply nickel thick amount to wound bed as instructed Secondary Dressing ABD Pad, 5x9 Discharge Instruction: Apply over primary dressing or foam border Secured With 77M Medipore H Soft Cloth Surgical T ape, 4 x 10 (in/yd) Discharge Instruction: Secure with tape as directed. Compression Wrap Compression Stockings Add-Ons Electronic Signature(s) Signed: 08/01/2021 5:59:26 PM By: Sharyn Creamer RN, BSN Entered By: Sharyn Creamer on 08/01/2021 15:14:59 -------------------------------------------------------------------------------- Vitals Details Patient Name: Date of Service: Jamie Boyden A. 08/01/2021 2:00 PM Medical Record Number: [id number] Patient Account Number: [id number] Date of Birth/Sex: Treating RN: 1980/10/24 (41 y.o. [name] Primary Care Airam Heidecker: [name] Other Clinician: Referring Tricha Ruggirello: Treating Gabrielle Wakeland/Extender: Trude Mcburney Weeks in Treatment: 265 Vital Signs Time Taken: 14:35 Temperature (F): 98.3 Height (in): 65 Pulse  (bpm): 91 Weight (lbs): 201 Respiratory Rate (breaths/min): 18 Body Mass Index (BMI): 33.4 Blood Pressure (mmHg): 133/89 Reference Range: 80 - 120 mg / dl Electronic Signature(s) Signed: 08/01/2021 5:59:26 PM By: Sharyn Creamer RN, BSN Entered By: Sharyn Creamer on 08/01/2021 14:50:27
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
## 2021-08-09 ENCOUNTER — Telehealth: Payer: Self-pay

## 2021-08-09 ENCOUNTER — Telehealth: Payer: Self-pay | Admitting: Family Medicine

## 2021-08-09 ENCOUNTER — Encounter (HOSPITAL_BASED_OUTPATIENT_CLINIC_OR_DEPARTMENT_OTHER): Payer: Medicaid Other | Admitting: General Surgery

## 2021-08-09 DIAGNOSIS — L89154 Pressure ulcer of sacral region, stage 4: Secondary | ICD-10-CM | POA: Diagnosis not present

## 2021-08-09 NOTE — Telephone Encounter (Signed)
PT ALREADY HAS APPT 6/16.

## 2021-08-09 NOTE — Progress Notes (Signed)
[name] ([id number]) Visit Report for 08/09/2021 Arrival Information Details Patient Name: Date of Service: [name], [name] 08/09/2021 2:00 PM Medical Record Number: [id number] Patient Account Number: [id number] Date of Birth/Sex: Treating RN: 09/04/1980 (41 y.o. [name] Primary Care Pamela Maddy: [name] Other Clinician: Referring Matayah Reyburn: Treating Sylas Twombly/Extender: [name] in Treatment: 52 Visit Information History Since Last Visit Added or deleted any medications: Yes Patient Arrived: Wheel Chair Any new allergies or adverse reactions: No Arrival Time: 14:21 Had a fall or experienced change in No Accompanied By: mother activities of daily living that may affect Transfer Assistance: Harrel Lemon Lift risk of falls: Patient Identification Verified: Yes Signs or symptoms of abuse/neglect since last visito No Secondary Verification Process Completed: Yes Hospitalized since last visit: Yes Patient Requires Transmission-Based Precautions: No Implantable device outside of the clinic excluding No Patient Has Alerts: No cellular tissue based products placed in the center since last visit: Has Dressing in Place as Prescribed: Yes Pain Present Now: No Electronic Signature(s) Signed: 08/09/2021 5:13:26 PM By: Baruch Gouty RN, BSN Entered By: Baruch Gouty on 08/09/2021 14:24:11 -------------------------------------------------------------------------------- Lower Extremity Assessment Details Patient Name: Date of Service: Jamie Boyden A. 08/09/2021 2:00 PM Medical Record Number: [id number] Patient Account Number: [id number] Date of Birth/Sex: Treating RN: Jul 17, 1980 (41 y.o. [name] Primary Care Rozell Kettlewell: [name] Other Clinician: Referring Jashiya Bassett: Treating Shamira Toutant/Extender: Trude Mcburney Weeks in Treatment: 267 Edema Assessment Assessed: Shirlyn Goltz: Yes] Patrice Paradise: Yes] [Left: Edema]  [Right: :] Electronic Signature(s) Signed: 08/09/2021 5:13:26 PM By: Baruch Gouty RN, BSN Entered By: Baruch Gouty on 08/09/2021 14:31:12 -------------------------------------------------------------------------------- Multi Wound Chart Details Patient Name: Date of Service: Jamie Boyden A. 08/09/2021 2:00 PM Medical Record Number: [id number] Patient Account Number: [id number] Date of Birth/Sex: Treating RN: March 15, 1980 (41 y.o. [name] Primary Care Gervase Colberg: [name] Other Clinician: Referring Jilleen Essner: Treating Lilliana Turner/Extender: Trude Mcburney Weeks in Treatment: 267 Vital Signs Height(in): 65 Pulse(bpm): 87 Weight(lbs): 201 Blood Pressure(mmHg): 125/87 Body Mass Index(BMI): 33.4 Temperature(F): 98.5 Respiratory Rate(breaths/min): 18 Photos: [N/A:N/A] Medial Sacrum Right, Posterior Upper Leg N/A Wound Location: Pressure Injury Skin Tear/Laceration N/A Wounding Event: Pressure Ulcer Skin Tear N/A Primary Etiology: Anemia, Osteomyelitis Anemia, Osteomyelitis N/A Comorbid History: 05/15/2016 05/30/2021 N/A Date Acquired: 267 9 N/A Weeks of Treatment: Open Open N/A Wound Status: No No N/A Wound Recurrence: 1.7x0.8x1.7 1.2x1x0.1 N/A Measurements L x W x D (cm) 1.068 0.942 N/A A (cm) : rea 1.816 0.094 N/A Volume (cm) : 96.80% 42.40% N/A % Reduction in A rea: 98.70% 42.30% N/A % Reduction in Volume: 1 Starting Position 1 (o'clock): 5 Ending Position 1 (o'clock): 2.3 Maximum Distance 1 (cm): Yes No N/A Undermining: Category/Stage IV Full Thickness Without Exposed N/A Classification: Support Structures Medium Medium N/A Exudate A mount: Serosanguineous Serous N/A Exudate Type: red, brown amber N/A Exudate Color: Thickened Flat and Intact N/A Wound Margin: Small (1-33%) Medium (34-66%) N/A Granulation A mount: Pink, Pale Pink N/A Granulation Quality: Large (67-100%) Medium (34-66%) N/A Necrotic A  mount: Fat Layer (Subcutaneous Tissue): Yes Fat Layer (Subcutaneous Tissue): Yes N/A Exposed Structures: Fascia: No Fascia: No Tendon: No Tendon: No Muscle: No Muscle: No Joint: No Joint: No Bone: No Bone: No Small (1-33%) Small (1-33%) N/A Epithelialization: Debridement - Excisional Debridement - Excisional N/A Debridement: Pre-procedure Verification/Time Out 14:50 14:50 N/A Taken: Other Other N/A Pain Control: Subcutaneous, Slough Subcutaneous, Slough N/A Tissue Debrided: Skin/Subcutaneous Tissue Skin/Subcutaneous Tissue N/A Level: 1.36 1.2 N/A Debridement A (sq cm): rea Curette  Curette N/A Instrument: Minimum Minimum N/A Bleeding: Pressure Pressure N/A Hemostasis A chieved: 0 0 N/A Procedural Pain: 0 0 N/A Post Procedural Pain: Procedure was tolerated well Procedure was tolerated well N/A Debridement Treatment Response: 1.7x0.8x1.7 1.2x1x0.1 N/A Post Debridement Measurements L x W x D (cm) 1.816 0.094 N/A Post Debridement Volume: (cm) Category/Stage IV N/A N/A Post Debridement Stage: Debridement Debridement N/A Procedures Performed: Negative Pressure Wound Therapy Maintenance (NPWT) Treatment Notes Electronic Signature(s) Signed: 08/09/2021 3:11:10 PM By: Fredirick Maudlin MD FACS Signed: 08/09/2021 5:13:26 PM By: Baruch Gouty RN, BSN Entered By: Fredirick Maudlin on 08/09/2021 15:11:10 -------------------------------------------------------------------------------- Multi-Disciplinary Care Plan Details Patient Name: Date of Service: Jamie Boyden A. 08/09/2021 2:00 PM Medical Record Number: [id number] Patient Account Number: [id number] Date of Birth/Sex: Treating RN: 09-13-1980 (41 y.o. [name] Primary Care [name]: [name] Other Clinician: Referring Clemens Lachman: Treating Anjel Pardo/Extender: [name] in Treatment: [place] reviewed with physician Active  Inactive Pressure Nursing Diagnoses: Knowledge deficit related to management of pressures ulcers Potential for impaired tissue integrity related to pressure, friction, moisture, and shear Goals: Patient will remain free from development of additional pressure ulcers Date Initiated: 06/27/2016 Date Inactivated: 01/17/2019 Target Resolution Date: 12/24/2018 Goal Status: Met Patient/caregiver will verbalize risk factors for pressure ulcer development Date Initiated: 06/27/2016 Date Inactivated: 07/08/2017 Target Resolution Date: 07/08/2017 Goal Status: Met Patient/caregiver will verbalize understanding of pressure ulcer management Date Initiated: 06/27/2016 Target Resolution Date: 08/22/2021 Goal Status: Active Interventions: Assess: immobility, friction, shearing, incontinence upon admission and as needed Assess offloading mechanisms upon admission and as needed Assess potential for pressure ulcer upon admission and as needed Provide education on pressure ulcers Treatment Activities: Patient referred for pressure reduction/relief devices : 06/27/2016 Pressure reduction/relief device ordered : 06/27/2016 Notes: Wound/Skin Impairment Nursing Diagnoses: Impaired tissue integrity Knowledge deficit related to smoking impact on wound healing Knowledge deficit related to ulceration/compromised skin integrity Goals: Patient will demonstrate a reduced rate of smoking or cessation of smoking Date Initiated: 06/27/2016 Date Inactivated: 01/26/2017 Target Resolution Date: 01/08/2017 Goal Status: Unmet Unmet Reason: pt continues to smoke Patient/caregiver will verbalize understanding of skin care regimen Date Initiated: 06/27/2016 Target Resolution Date: 08/22/2021 Goal Status: Active Ulcer/skin breakdown will have a volume reduction of 50% by week 8 Date Initiated: 06/27/2016 Date Inactivated: 12/11/2016 Target Resolution Date: 07/25/2016 Goal Status: Met Interventions: Assess patient/caregiver  ability to obtain necessary supplies Assess patient/caregiver ability to perform ulcer/skin care regimen upon admission and as needed Assess ulceration(s) every visit Provide education on smoking Provide education on ulcer and skin care Treatment Activities: Skin care regimen initiated : 06/27/2016 Topical wound management initiated : 06/27/2016 Notes: Electronic Signature(s) Signed: 08/09/2021 5:13:26 PM By: Baruch Gouty RN, BSN Entered By: Baruch Gouty on 08/09/2021 14:40:02 -------------------------------------------------------------------------------- Negative Pressure Wound Therapy Maintenance (NPWT) Details Patient Name: Date of Service: KIMA, MALENFANT 08/09/2021 2:00 PM Medical Record Number: [id number] Patient Account Number: [id number] Date of Birth/Sex: Treating RN: 1980-07-06 (41 y.o. [name] Primary Care Drayden Lukas: [name] Other Clinician: Referring [place]: Treating Prescilla Monger/Extender: Trude Mcburney Weeks in Treatment: 267 NPWT Maintenance Performed for: Wound #1 Medial Sacrum Additional Injuries Covered: No Performed By: Baruch Gouty, RN Type: VAC System Coverage Size (sq cm): 1.36 Pressure Type: Constant Pressure Setting: 125 mmHG Drain Type: None Sponge/Dressing Type: Foam, Black Date Initiated: 06/30/2016 Dressing Removed: No Canister Changed: No Dressing Reapplied: No Quantity of Sponges/Gauze Inserted: 1 Respones T Treatment: o good Days On NPWT : 1867 Post Procedure Diagnosis Same  as Therapist, sports) Signed: 08/09/2021 5:13:26 PM By: Baruch Gouty RN, BSN Entered By: Baruch Gouty on 08/09/2021 14:54:36 -------------------------------------------------------------------------------- Pain Assessment Details Patient Name: Date of Service: Jamie Boyden A. 08/09/2021 2:00 PM Medical Record Number: [id number] Patient Account Number: [id number] Date of Birth/Sex: Treating  RN: [birth year] (41 y.o. [name] Primary Care Alianna Wurster: [name] Other Clinician: Referring Cache Bills: Treating Castella Lerner/Extender: [name] in Treatment: 267 Active Problems Location of Pain Severity and Description of Pain Patient Has Paino No Site Locations Rate the pain. Current Pain Level: 0 Pain Management and Medication Current Pain Management: Electronic Signature(s) Signed: 08/09/2021 5:13:26 PM By: Baruch Gouty RN, BSN Entered By: Baruch Gouty on 08/09/2021 14:24:53 -------------------------------------------------------------------------------- Patient/Caregiver Education Details Patient Name: Date of Service: Karlton Lemon 6/9/2023andnbsp2:00 PM Medical Record Number: [id number] Patient Account Number: [id number] Date of Birth/Gender: Treating RN: 12/26/1980 (41 y.o. [name] Primary Care Physician: [name] Other Clinician: Referring Physician: Treating Physician/Extender: [name] in Treatment: 267 Education Assessment Education Provided To: Patient Education Topics Provided Pressure: Methods: Explain/Verbal Responses: Reinforcements needed, State content correctly Wound/Skin Impairment: Methods: Explain/Verbal Responses: Reinforcements needed, State content correctly Electronic Signature(s) Signed: 08/09/2021 5:13:26 PM By: Baruch Gouty RN, BSN Entered By: Baruch Gouty on 08/09/2021 14:40:23 -------------------------------------------------------------------------------- Wound Assessment Details Patient Name: Date of Service: Jamie Boyden A. 08/09/2021 2:00 PM Medical Record Number: [id number] Patient Account Number: [id number] Date of Birth/Sex: Treating RN: Feb 26, 1981 (41 y.o. [name] Primary Care Renetta Suman: [name] Other Clinician: Referring Vanilla Heatherington: Treating Kaelyn Nauta/Extender: Trude Mcburney Weeks in  Treatment: 267 Wound Status Wound Number: 1 Primary Etiology: Pressure Ulcer Wound Location: Medial Sacrum Wound Status: Open Wounding Event: Pressure Injury Comorbid History: Anemia, Osteomyelitis Date Acquired: 05/15/2016 Weeks Of Treatment: 267 Clustered Wound: No Photos Wound Measurements Length: (cm) 1.7 Width: (cm) 0.8 Depth: (cm) 1.7 Area: (cm) 1.068 Volume: (cm) 1.816 % Reduction in Area: 96.8% % Reduction in Volume: 98.7% Epithelialization: Small (1-33%) Tunneling: No Undermining: Yes Starting Position (o'clock): 1 Ending Position (o'clock): 5 Maximum Distance: (cm) 2.3 Wound Description Classification: Category/Stage IV Wound Margin: Thickened Exudate Amount: Medium Exudate Type: Serosanguineous Exudate Color: red, brown Foul Odor After Cleansing: No Slough/Fibrino No Wound Bed Granulation Amount: Small (1-33%) Exposed Structure Granulation Quality: Pink, Pale Fascia Exposed: No Necrotic Amount: Large (67-100%) Fat Layer (Subcutaneous Tissue) Exposed: Yes Necrotic Quality: Adherent Slough Tendon Exposed: No Muscle Exposed: No Joint Exposed: No Bone Exposed: No Electronic Signature(s) Signed: 08/09/2021 5:13:26 PM By: Baruch Gouty RN, BSN Signed: 08/09/2021 5:52:14 PM By: Levan Hurst RN, BSN Entered By: Levan Hurst on 08/09/2021 14:44:59 -------------------------------------------------------------------------------- Wound Assessment Details Patient Name: Date of Service: Jamie Boyden A. 08/09/2021 2:00 PM Medical Record Number: [id number] Patient Account Number: [id number] Date of Birth/Sex: Treating RN: 19-Nov-1980 (41 y.o. [name] Primary Care Emmanuelle Hibbitts: [name] Other Clinician: Referring Varun Jourdan: Treating Charlies Rayburn/Extender: Trude Mcburney Weeks in Treatment: 267 Wound Status Wound Number: 16 Primary Etiology: Skin Tear Wound Location: Right, Posterior Upper Leg Wound Status: Open Wounding  Event: Skin Tear/Laceration Comorbid History: Anemia, Osteomyelitis Date Acquired: 05/30/2021 Weeks Of Treatment: 9 Clustered Wound: No Photos Wound Measurements Length: (cm) 1.2 Width: (cm) 1 Depth: (cm) 0.1 Area: (cm) 0.942 Volume: (cm) 0.094 % Reduction in Area: 42.4% % Reduction in Volume: 42.3% Epithelialization: Small (1-33%) Tunneling: No Undermining: No Wound Description Classification: Full Thickness Without Exposed Support Structures Wound Margin: Flat and Intact Exudate Amount: Medium Exudate Type: Serous Exudate Color: amber  Foul Odor After Cleansing: No Slough/Fibrino Yes Wound Bed Granulation Amount: Medium (34-66%) Exposed Structure Granulation Quality: Pink Fascia Exposed: No Necrotic Amount: Medium (34-66%) Fat Layer (Subcutaneous Tissue) Exposed: Yes Necrotic Quality: Adherent Slough Tendon Exposed: No Muscle Exposed: No Joint Exposed: No Bone Exposed: No Electronic Signature(s) Signed: 08/09/2021 5:13:26 PM By: Baruch Gouty RN, BSN Signed: 08/09/2021 5:52:14 PM By: Levan Hurst RN, BSN Entered By: Levan Hurst on 08/09/2021 14:42:37 -------------------------------------------------------------------------------- [place] Details Patient Name: Date of Service: Jamie Boyden A. 08/09/2021 2:00 PM Medical Record Number: [id number] Patient Account Number: [id number] Date of Birth/Sex: Treating RN: [birth year] (40 y.o. [name] Primary Care Cortez Flippen: [name] Other Clinician: Referring Vedder Brittian: Treating Lamica Mccart/Extender: [name] in Treatment: 267 Vital Signs Time Taken: 14:24 Temperature (F): 98.5 Height (in): 65 Pulse (bpm): 87 Source: Stated Respiratory Rate (breaths/min): 18 Weight (lbs): 201 Blood Pressure (mmHg): 125/87 Source: Stated Reference Range: 80 - 120 mg / dl Body Mass Index (BMI): 33.4 Electronic Signature(s) Signed: 08/09/2021 5:13:26 PM By: Baruch Gouty RN,  BSN Entered By: Baruch Gouty on 08/09/2021 14:24:44

## 2021-08-09 NOTE — Telephone Encounter (Signed)
Transition Care Management Follow-up Telephone Call Date of discharge and from where: 08/08/21 [facility] - UTI How have you been since you were released from the hospital? Better, just tired Any questions or concerns? No  Items Reviewed: Did the pt receive and understand the discharge instructions provided? Yes  Medications obtained and verified? Yes  Other? No  Any new allergies since your discharge? No  Dietary orders reviewed? Yes Do you have support at home? Yes   Home Care and Equipment/Supplies: Were home health services ordered? no  Were any new equipment or medical supplies ordered?  No  Functional Questionnaire: (I = Independent and D = Dependent) ADLs: D  Bathing/Dressing- D -needs assistance  Meal Prep- D  Eating- I  Maintaining continence- D  Transferring/Ambulation- D  Managing Meds- I, but her mother usually handles  Follow up appointments reviewed:  PCP Hospital f/u appt confirmed? Yes  Scheduled to see [name] on 08/16/21 @ 2:05. [facility] f/u appt confirmed? Yes  Scheduled to see wound care on 08/09/21 @ 2. Are transportation arrangements needed? No  If their condition worsens, is the pt aware to call PCP or go to the Emergency Dept.? Yes Was the patient provided with contact information for the PCP's office or ED? Yes Was to pt encouraged to call back with questions or concerns? Yes
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
## 2021-08-09 NOTE — Telephone Encounter (Signed)
Patient needs a hospital follow up, she was at [facility] and was released on 6/8. Please  call back.

## 2021-08-16 ENCOUNTER — Ambulatory Visit: Payer: Medicaid Other | Admitting: Family Medicine

## 2021-08-16 ENCOUNTER — Encounter: Payer: Self-pay | Admitting: Family Medicine

## 2021-08-16 VITALS — BP 133/90 | HR 108 | Temp 98.5°F

## 2021-08-16 DIAGNOSIS — T83511D Infection and inflammatory reaction due to indwelling urethral catheter, subsequent encounter: Secondary | ICD-10-CM

## 2021-08-16 DIAGNOSIS — N390 Urinary tract infection, site not specified: Secondary | ICD-10-CM

## 2021-08-16 DIAGNOSIS — G8929 Other chronic pain: Secondary | ICD-10-CM

## 2021-08-16 DIAGNOSIS — Z09 Encounter for follow-up examination after completed treatment for conditions other than malignant neoplasm: Secondary | ICD-10-CM | POA: Diagnosis not present

## 2021-08-16 DIAGNOSIS — R369 Urethral discharge, unspecified: Secondary | ICD-10-CM | POA: Diagnosis not present

## 2021-08-16 MED ORDER — KETOROLAC TROMETHAMINE 60 MG/2ML IM SOLN
60.0000 mg | Freq: Once | INTRAMUSCULAR | Status: AC
  Administered 2021-08-16: 60 mg via INTRAMUSCULAR

## 2021-08-16 MED ORDER — FERROUS SULFATE 325 (65 FE) MG PO TABS: 325.0000 mg | ORAL_TABLET | Freq: Every day | ORAL | 1 refills | Status: DC
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
## 2021-08-17 ENCOUNTER — Other Ambulatory Visit: Payer: Self-pay

## 2021-08-17 ENCOUNTER — Inpatient Hospital Stay (HOSPITAL_COMMUNITY)
Admission: EM | Admit: 2021-08-17 | Discharge: 2021-08-20 | DRG: 698 | Disposition: A | Discharge status: Home Health | Payer: Medicaid Other | Attending: Family Medicine | Admitting: Family Medicine

## 2021-08-17 ENCOUNTER — Encounter (HOSPITAL_COMMUNITY): Payer: Self-pay

## 2021-08-17 DIAGNOSIS — T83518A Infection and inflammatory reaction due to other urinary catheter, initial encounter: Secondary | ICD-10-CM | POA: Diagnosis present

## 2021-08-17 DIAGNOSIS — Z8249 Family history of ischemic heart disease and other diseases of the circulatory system: Secondary | ICD-10-CM

## 2021-08-17 DIAGNOSIS — Z800 Family history of malignant neoplasm of digestive organs: Secondary | ICD-10-CM

## 2021-08-17 DIAGNOSIS — F32A Depression, unspecified: Secondary | ICD-10-CM | POA: Diagnosis present

## 2021-08-17 DIAGNOSIS — D72829 Elevated white blood cell count, unspecified: Secondary | ICD-10-CM | POA: Diagnosis present

## 2021-08-17 DIAGNOSIS — Y846 Urinary catheterization as the cause of abnormal reaction of the patient, or of later complication, without mention of misadventure at the time of the procedure: Secondary | ICD-10-CM | POA: Diagnosis present

## 2021-08-17 DIAGNOSIS — Z83438 Family history of other disorder of lipoprotein metabolism and other lipidemia: Secondary | ICD-10-CM

## 2021-08-17 DIAGNOSIS — L89154 Pressure ulcer of sacral region, stage 4: Secondary | ICD-10-CM | POA: Diagnosis present

## 2021-08-17 DIAGNOSIS — D75839 Thrombocytosis, unspecified: Secondary | ICD-10-CM | POA: Diagnosis present

## 2021-08-17 DIAGNOSIS — Z79899 Other long term (current) drug therapy: Secondary | ICD-10-CM

## 2021-08-17 DIAGNOSIS — Z6841 Body Mass Index (BMI) 40.0 and over, adult: Secondary | ICD-10-CM

## 2021-08-17 DIAGNOSIS — Z8049 Family history of malignant neoplasm of other genital organs: Secondary | ICD-10-CM

## 2021-08-17 DIAGNOSIS — G8222 Paraplegia, incomplete: Secondary | ICD-10-CM | POA: Diagnosis present

## 2021-08-17 DIAGNOSIS — E785 Hyperlipidemia, unspecified: Secondary | ICD-10-CM | POA: Diagnosis present

## 2021-08-17 DIAGNOSIS — N390 Urinary tract infection, site not specified: Secondary | ICD-10-CM | POA: Diagnosis present

## 2021-08-17 DIAGNOSIS — K219 Gastro-esophageal reflux disease without esophagitis: Secondary | ICD-10-CM | POA: Diagnosis present

## 2021-08-17 DIAGNOSIS — G8929 Other chronic pain: Secondary | ICD-10-CM | POA: Diagnosis present

## 2021-08-17 DIAGNOSIS — K592 Neurogenic bowel, not elsewhere classified: Secondary | ICD-10-CM | POA: Diagnosis present

## 2021-08-17 DIAGNOSIS — Z87891 Personal history of nicotine dependence: Secondary | ICD-10-CM

## 2021-08-17 DIAGNOSIS — Z8349 Family history of other endocrine, nutritional and metabolic diseases: Secondary | ICD-10-CM

## 2021-08-17 DIAGNOSIS — L89004 Pressure ulcer of unspecified elbow, stage 4: Secondary | ICD-10-CM | POA: Diagnosis not present

## 2021-08-17 DIAGNOSIS — E039 Hypothyroidism, unspecified: Secondary | ICD-10-CM

## 2021-08-17 DIAGNOSIS — Z79890 Hormone replacement therapy (postmenopausal): Secondary | ICD-10-CM | POA: Diagnosis not present

## 2021-08-17 DIAGNOSIS — Z981 Arthrodesis status: Secondary | ICD-10-CM | POA: Diagnosis not present

## 2021-08-17 DIAGNOSIS — F1190 Opioid use, unspecified, uncomplicated: Secondary | ICD-10-CM | POA: Diagnosis present

## 2021-08-17 DIAGNOSIS — N319 Neuromuscular dysfunction of bladder, unspecified: Secondary | ICD-10-CM | POA: Diagnosis present

## 2021-08-17 DIAGNOSIS — Z823 Family history of stroke: Secondary | ICD-10-CM

## 2021-08-17 DIAGNOSIS — D649 Anemia, unspecified: Secondary | ICD-10-CM | POA: Diagnosis present

## 2021-08-17 DIAGNOSIS — G629 Polyneuropathy, unspecified: Secondary | ICD-10-CM | POA: Diagnosis present

## 2021-08-17 DIAGNOSIS — Z9359 Other cystostomy status: Secondary | ICD-10-CM | POA: Diagnosis not present

## 2021-08-17 DIAGNOSIS — Z9049 Acquired absence of other specified parts of digestive tract: Secondary | ICD-10-CM | POA: Diagnosis not present

## 2021-08-17 DIAGNOSIS — L8990 Pressure ulcer of unspecified site, unspecified stage: Secondary | ICD-10-CM | POA: Diagnosis present

## 2021-08-17 DIAGNOSIS — F419 Anxiety disorder, unspecified: Secondary | ICD-10-CM | POA: Diagnosis present

## 2021-08-17 DIAGNOSIS — M549 Dorsalgia, unspecified: Secondary | ICD-10-CM | POA: Diagnosis present

## 2021-08-17 DIAGNOSIS — Z888 Allergy status to other drugs, medicaments and biological substances status: Secondary | ICD-10-CM | POA: Diagnosis not present

## 2021-08-17 DIAGNOSIS — N3001 Acute cystitis with hematuria: Principal | ICD-10-CM

## 2021-08-17 DIAGNOSIS — Z7951 Long term (current) use of inhaled steroids: Secondary | ICD-10-CM

## 2021-08-17 DIAGNOSIS — I10 Essential (primary) hypertension: Secondary | ICD-10-CM | POA: Diagnosis present

## 2021-08-17 DIAGNOSIS — E669 Obesity, unspecified: Secondary | ICD-10-CM | POA: Diagnosis present

## 2021-08-17 DIAGNOSIS — Z833 Family history of diabetes mellitus: Secondary | ICD-10-CM

## 2021-08-17 LAB — COMPREHENSIVE METABOLIC PANEL
ALT: 19 U/L (ref 0–44)
AST: 16 U/L (ref 15–41)
Albumin: 3.7 g/dL (ref 3.5–5.0)
Alkaline Phosphatase: 116 U/L (ref 38–126)
Anion gap: 6 (ref 5–15)
BUN: 11 mg/dL (ref 6–20)
CO2: 28 mmol/L (ref 22–32)
Calcium: 8.6 mg/dL — ABNORMAL LOW (ref 8.9–10.3)
Chloride: 103 mmol/L (ref 98–111)
Creatinine, Ser: 0.51 mg/dL (ref 0.44–1.00)
GFR, Estimated: >60 mL/min (ref >60)
Glucose, Bld: 85 mg/dL (ref 70–99)
Potassium: 3.8 mmol/L (ref 3.5–5.1)
Sodium: 137 mmol/L (ref 135–145)
Total Bilirubin: 0.3 mg/dL (ref 0.3–1.2)
Total Protein: 7.1 g/dL (ref 6.5–8.1)

## 2021-08-17 LAB — CBC WITH DIFFERENTIAL/PLATELET
Abs Immature Granulocytes: 0.02 10*3/uL (ref 0.00–0.07)
Basophils Absolute: 0 10*3/uL (ref 0.0–0.1)
Basophils Relative: 0 %
Eosinophils Absolute: 0.4 10*3/uL (ref 0.0–0.5)
Eosinophils Relative: 4 %
HCT: 36.7 % (ref 36.0–46.0)
Hemoglobin: 11 g/dL — ABNORMAL LOW (ref 12.0–15.0)
Immature Granulocytes: 0 %
Lymphocytes Relative: 27 %
Lymphs Abs: 2.9 10*3/uL (ref 0.7–4.0)
MCH: 24.6 pg — ABNORMAL LOW (ref 26.0–34.0)
MCHC: 30 g/dL (ref 30.0–36.0)
MCV: 81.9 fL (ref 80.0–100.0)
Monocytes Absolute: 0.6 10*3/uL (ref 0.1–1.0)
Monocytes Relative: 5 %
Neutro Abs: 6.9 10*3/uL (ref 1.7–7.7)
Neutrophils Relative %: 64 %
Platelets: 503 10*3/uL — ABNORMAL HIGH (ref 150–400)
RBC: 4.48 MIL/uL (ref 3.87–5.11)
RDW: 17.3 % — ABNORMAL HIGH (ref 11.5–15.5)
WBC: 10.8 10*3/uL — ABNORMAL HIGH (ref 4.0–10.5)
nRBC: 0 % (ref 0.0–0.2)

## 2021-08-17 LAB — URINALYSIS, ROUTINE W REFLEX MICROSCOPIC
Bilirubin Urine: NEGATIVE
Glucose, UA: NEGATIVE mg/dL
Hgb urine dipstick: NEGATIVE
Ketones, ur: NEGATIVE mg/dL
Nitrite: NEGATIVE
Protein, ur: NEGATIVE mg/dL
Specific Gravity, Urine: 1.009 (ref 1.005–1.030)
pH: 6 (ref 5.0–8.0)

## 2021-08-17 LAB — LACTIC ACID, PLASMA: Lactic Acid, Venous: 0.9 mmol/L (ref 0.5–1.9)

## 2021-08-17 MED ORDER — SODIUM CHLORIDE 0.9 % IV BOLUS
1000.0000 mL | Freq: Once | INTRAVENOUS | Status: AC
  Administered 2021-08-17: 1000 mL via INTRAVENOUS

## 2021-08-17 MED ORDER — SODIUM CHLORIDE 0.9 % IV SOLN
2.0000 g | Freq: Once | INTRAVENOUS | Status: AC
  Administered 2021-08-17: 2 g via INTRAVENOUS
  Filled 2021-08-17: qty 20

## 2021-08-17 NOTE — ED Triage Notes (Signed)
EMS called out for pt having weakness, lethargy, vaginal pain and discharge (yellow color). Pt was at [facility] for infection few weeks ago. Given amoxicillin and cipro. Pt A&O X 4. Pt is paraplegic.
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
## 2021-08-18 ENCOUNTER — Encounter (HOSPITAL_COMMUNITY): Payer: Self-pay | Admitting: Internal Medicine

## 2021-08-18 DIAGNOSIS — E039 Hypothyroidism, unspecified: Secondary | ICD-10-CM | POA: Diagnosis not present

## 2021-08-18 DIAGNOSIS — L89154 Pressure ulcer of sacral region, stage 4: Secondary | ICD-10-CM | POA: Diagnosis not present

## 2021-08-18 DIAGNOSIS — N390 Urinary tract infection, site not specified: Secondary | ICD-10-CM | POA: Diagnosis not present

## 2021-08-18 DIAGNOSIS — N319 Neuromuscular dysfunction of bladder, unspecified: Secondary | ICD-10-CM | POA: Diagnosis not present

## 2021-08-18 LAB — CBC
HCT: 31.8 % — ABNORMAL LOW (ref 36.0–46.0)
Hemoglobin: 9.7 g/dL — ABNORMAL LOW (ref 12.0–15.0)
MCH: 24.9 pg — ABNORMAL LOW (ref 26.0–34.0)
MCHC: 30.5 g/dL (ref 30.0–36.0)
MCV: 81.7 fL (ref 80.0–100.0)
Platelets: 411 10*3/uL — ABNORMAL HIGH (ref 150–400)
RBC: 3.89 MIL/uL (ref 3.87–5.11)
RDW: 17.1 % — ABNORMAL HIGH (ref 11.5–15.5)
WBC: 8.2 10*3/uL (ref 4.0–10.5)
nRBC: 0 % (ref 0.0–0.2)

## 2021-08-18 LAB — COMPREHENSIVE METABOLIC PANEL
ALT: 16 U/L (ref 0–44)
AST: 15 U/L (ref 15–41)
Albumin: 3.1 g/dL — ABNORMAL LOW (ref 3.5–5.0)
Alkaline Phosphatase: 94 U/L (ref 38–126)
Anion gap: 3 — ABNORMAL LOW (ref 5–15)
BUN: 11 mg/dL (ref 6–20)
CO2: 27 mmol/L (ref 22–32)
Calcium: 8.4 mg/dL — ABNORMAL LOW (ref 8.9–10.3)
Chloride: 108 mmol/L (ref 98–111)
Creatinine, Ser: 0.51 mg/dL (ref 0.44–1.00)
GFR, Estimated: >60 mL/min (ref >60)
Glucose, Bld: 90 mg/dL (ref 70–99)
Potassium: 3.6 mmol/L (ref 3.5–5.1)
Sodium: 138 mmol/L (ref 135–145)
Total Bilirubin: 0.1 mg/dL — ABNORMAL LOW (ref 0.3–1.2)
Total Protein: 6.1 g/dL — ABNORMAL LOW (ref 6.5–8.1)

## 2021-08-18 LAB — MAGNESIUM: Magnesium: 1.9 mg/dL (ref 1.7–2.4)

## 2021-08-18 LAB — PHOSPHORUS: Phosphorus: 3.3 mg/dL (ref 2.5–4.6)

## 2021-08-18 LAB — APTT: aPTT: 35 seconds (ref 24–36)

## 2021-08-18 LAB — LACTIC ACID, PLASMA: Lactic Acid, Venous: 1 mmol/L (ref 0.5–1.9)

## 2021-08-18 LAB — HIV ANTIBODY (ROUTINE TESTING W REFLEX): HIV Screen 4th Generation wRfx: NONREACTIVE

## 2021-08-18 MED ORDER — GABAPENTIN 300 MG PO CAPS
600.0000 mg | ORAL_CAPSULE | Freq: Three times a day (TID) | ORAL | Status: DC
  Administered 2021-08-18 – 2021-08-20 (×6): 600 mg via ORAL
  Filled 2021-08-18 (×6): qty 2

## 2021-08-18 MED ORDER — PANTOPRAZOLE SODIUM 40 MG PO TBEC
40.0000 mg | DELAYED_RELEASE_TABLET | Freq: Every day | ORAL | Status: DC
  Administered 2021-08-18 – 2021-08-20 (×3): 40 mg via ORAL
  Filled 2021-08-18 (×3): qty 1

## 2021-08-18 MED ORDER — ACETAMINOPHEN 325 MG PO TABS: 650.0000 mg | ORAL_TABLET | Freq: Four times a day (QID) | ORAL | Status: DC | PRN

## 2021-08-18 MED ORDER — OXYCODONE HCL 5 MG PO TABS
10.0000 mg | ORAL_TABLET | Freq: Three times a day (TID) | ORAL | Status: DC | PRN
  Administered 2021-08-18 – 2021-08-20 (×9): 10 mg via ORAL
  Filled 2021-08-18 (×9): qty 2

## 2021-08-18 MED ORDER — FLUTICASONE PROPIONATE 50 MCG/ACT NA SUSP
2.0000 | Freq: Every day | NASAL | Status: DC | PRN
Start: 2021-08-18 — End: 2021-08-20

## 2021-08-18 MED ORDER — COLLAGENASE 250 UNIT/GM EX OINT
TOPICAL_OINTMENT | Freq: Every day | CUTANEOUS | Status: DC
  Filled 2021-08-18: qty 30

## 2021-08-18 MED ORDER — IPRATROPIUM-ALBUTEROL 0.5-2.5 (3) MG/3ML IN SOLN: 3.0000 mL | Freq: Four times a day (QID) | RESPIRATORY_TRACT | Status: DC | PRN

## 2021-08-18 MED ORDER — SODIUM CHLORIDE 0.9 % IV SOLN: INTRAVENOUS | Status: AC

## 2021-08-18 MED ORDER — ENOXAPARIN SODIUM 40 MG/0.4ML IJ SOSY
40.0000 mg | PREFILLED_SYRINGE | INTRAMUSCULAR | Status: DC
  Administered 2021-08-18 – 2021-08-20 (×3): 40 mg via SUBCUTANEOUS
  Filled 2021-08-18 (×3): qty 0.4

## 2021-08-18 MED ORDER — SODIUM CHLORIDE 0.9 % IV SOLN
1.0000 g | Freq: Three times a day (TID) | INTRAVENOUS | Status: DC
  Administered 2021-08-18 – 2021-08-20 (×8): 1 g via INTRAVENOUS
  Filled 2021-08-18 (×8): qty 20

## 2021-08-18 MED ORDER — ACETAMINOPHEN 650 MG RE SUPP: 650.0000 mg | Freq: Four times a day (QID) | RECTAL | Status: DC | PRN

## 2021-08-18 MED ORDER — LEVOTHYROXINE SODIUM 88 MCG PO TABS
88.0000 ug | ORAL_TABLET | Freq: Every day | ORAL | Status: DC
  Administered 2021-08-18 – 2021-08-20 (×3): 88 ug via ORAL
  Filled 2021-08-18 (×3): qty 1

## 2021-08-18 MED ORDER — OXYBUTYNIN CHLORIDE ER 5 MG PO TB24
5.0000 mg | ORAL_TABLET | Freq: Every day | ORAL | Status: DC
  Administered 2021-08-18 – 2021-08-20 (×3): 5 mg via ORAL
  Filled 2021-08-18 (×3): qty 1

## 2021-08-18 NOTE — Progress Notes (Signed)
Pharmacy Antibiotic Note  [name] is a 41 y.o. female with h/o paraplegia and suprapubic catheter admitted on 08/17/2021 with weakness, possible UTI.  Pharmacy has been consulted for Meropenem dosing due to prior ESBL infections.  Plan: Meropenem 1 g IV q8h  Height: '5\' 6"'$  (167.6 cm) Weight: 122.5 kg (270 lb) IBW/kg (Calculated) : 59.3  Temp (24hrs), Avg:97.8 F (36.6 C), Min:97.8 F (36.6 C), Max:97.8 F (36.6 C)  Recent Labs  Lab 08/17/21 1803 08/17/21 2218  WBC 10.8*  --   CREATININE 0.51  --   LATICACIDVEN  --  0.9    Estimated Creatinine Clearance: 126.1 mL/min (by C-G formula based on SCr of 0.51 mg/dL).    Allergies  Allergen Reactions   Macrobid [Nitrofurantoin]     Not effective for patient. "Lung issues"   Lisinopril Cough     [name] 08/18/2021 12:21 AM

## 2021-08-18 NOTE — Consult Note (Signed)
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 Also, full thickness wound to RLE. Patient is followed by the outpatient [facility] in [facility] by Dr. Celine Ahr.   I called the patient's mother today to discuss and determine the POC.  The patient's mother has been instructed in the application of the NPWT (V.A.C.) dressing by Dr. Celine Ahr at the outpatient wound care facility. The patient's mother is to apply a compounded triple-antibiotic cream from [facility] to the tip of the NPWT sponge prior to inserting it into the deeper part of the wound and use the black foam to fill the remainder of the defect prior to securing the foam with drape and affixing the dressing to 146mHg continuous negative pressure. The dressing is to be changed every 3 days and PRN leakage.   The patient's mother is advised that she is required to use the hospital's drape while in house and save her dressing kits for home use. She is actually delighted to hear this as her daughter is incontinent and the periwound skin is difficult to dry thoroughly and also because of the oily nature of the triple antibiotic cream that she must apply to the foam, the hospital drape adheres more effectively than the silicone VAC drape that she is provided in her home VAC kits.  The Bedside RN is to change the right LE dressing daily using collagenase (Santyl). Orders have been provided for Nursing.   Wound type:Pressure Pressure Injury POA: Yes Measurement:Per Dr. CGlenford Peerslast visit with patient on 08/09/21: Sacrum:1.7cm x 0.8cm x 1.8cm Right LE: 1.2cm x 1cm x 0.1cm  Wound bed:red, moist Drainage (amount, consistency, odor): moderate serous (sacrum) Small serous (RLE) Periwound:Intact with evidence of previous wound contracture and healing Dressing procedure/placement/frequency:  I have provided Orders for Nursing that reflect the above POC and discussed the POC with the patient's nurse (W.  Early) and the patient's mother today. If further confirmation of the PCO is desired, please contact Dr. CCeline Ahrat the outpatient wound care center.  [place] will [place] follow, but will remain available to this patient, the nursing and medical teams.  Please re-consult if needed.  Thank you for inviting [facility] participate in this patient's Plan of Care.  LMaudie Flakes MSN, RN, CNS, GR[place] CSerita Grammes WErie [employer] FUnisys Corporationphone:  [phone]
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
## 2021-08-18 NOTE — Progress Notes (Signed)
Patients mom has been doing her wound vac and dressing changes at home so they were more comfortable with mom changing it here today. Wound vac changed by mom with this nurses assistance. Patient does have SP tube but also has incontinence and wears depends.

## 2021-08-19 ENCOUNTER — Encounter: Payer: Self-pay | Admitting: Family Medicine

## 2021-08-19 DIAGNOSIS — E039 Hypothyroidism, unspecified: Secondary | ICD-10-CM | POA: Diagnosis not present

## 2021-08-19 DIAGNOSIS — L89154 Pressure ulcer of sacral region, stage 4: Secondary | ICD-10-CM | POA: Diagnosis not present

## 2021-08-19 DIAGNOSIS — N390 Urinary tract infection, site not specified: Secondary | ICD-10-CM | POA: Diagnosis not present

## 2021-08-19 LAB — URINE CULTURE: Culture: 20000 — AB

## 2021-08-19 MED ORDER — FLUCONAZOLE IN SODIUM CHLORIDE 200-0.9 MG/100ML-% IV SOLN
200.0000 mg | INTRAVENOUS | Status: DC
  Administered 2021-08-19: 200 mg via INTRAVENOUS
  Filled 2021-08-19: qty 100

## 2021-08-19 NOTE — Progress Notes (Signed)
Patient has been resting comfortably throughout the day. Wound vac has been working well and wound care was done to patients right upper leg.
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
## 2021-08-20 DIAGNOSIS — L89004 Pressure ulcer of unspecified elbow, stage 4: Secondary | ICD-10-CM | POA: Diagnosis not present

## 2021-08-20 DIAGNOSIS — E039 Hypothyroidism, unspecified: Secondary | ICD-10-CM | POA: Diagnosis not present

## 2021-08-20 DIAGNOSIS — N319 Neuromuscular dysfunction of bladder, unspecified: Secondary | ICD-10-CM | POA: Diagnosis not present

## 2021-08-20 DIAGNOSIS — N390 Urinary tract infection, site not specified: Secondary | ICD-10-CM | POA: Diagnosis not present

## 2021-08-20 LAB — BASIC METABOLIC PANEL
Anion gap: 4 — ABNORMAL LOW (ref 5–15)
BUN: 10 mg/dL (ref 6–20)
CO2: 30 mmol/L (ref 22–32)
Calcium: 9 mg/dL (ref 8.9–10.3)
Chloride: 104 mmol/L (ref 98–111)
Creatinine, Ser: 0.56 mg/dL (ref 0.44–1.00)
GFR, Estimated: >60 mL/min (ref >60)
Glucose, Bld: 94 mg/dL (ref 70–99)
Potassium: 3.7 mmol/L (ref 3.5–5.1)
Sodium: 138 mmol/L (ref 135–145)

## 2021-08-20 LAB — CBC
HCT: 36.5 % (ref 36.0–46.0)
Hemoglobin: 11 g/dL — ABNORMAL LOW (ref 12.0–15.0)
MCH: 24.6 pg — ABNORMAL LOW (ref 26.0–34.0)
MCHC: 30.1 g/dL (ref 30.0–36.0)
MCV: 81.7 fL (ref 80.0–100.0)
Platelets: 458 10*3/uL — ABNORMAL HIGH (ref 150–400)
RBC: 4.47 MIL/uL (ref 3.87–5.11)
RDW: 17.2 % — ABNORMAL HIGH (ref 11.5–15.5)
WBC: 9.6 10*3/uL (ref 4.0–10.5)
nRBC: 0 % (ref 0.0–0.2)

## 2021-08-20 MED ORDER — FLUCONAZOLE IN SODIUM CHLORIDE 200-0.9 MG/100ML-% IV SOLN
200.0000 mg | Freq: Once | INTRAVENOUS | Status: AC
  Administered 2021-08-20: 200 mg via INTRAVENOUS
  Filled 2021-08-20: qty 100

## 2021-08-20 MED ORDER — FLUCONAZOLE 200 MG PO TABS: 200.0000 mg | ORAL_TABLET | Freq: Every day | ORAL | 0 refills | Status: AC
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
## 2021-08-20 NOTE — Discharge Instructions (Signed)
1)Follow up with Urologist for Suprapubic catheter change per usual schedule 2)Follow up with pain management specialist per usual schedule 3)Continue wound care as advised

## 2021-08-20 NOTE — Progress Notes (Signed)
[facility] arrived to take pt home.

## 2021-08-20 NOTE — Progress Notes (Signed)
Called EMS for pt to be transported home. She is now on the list for pick up.

## 2021-08-21 ENCOUNTER — Other Ambulatory Visit: Payer: Self-pay | Admitting: Family Medicine

## 2021-08-21 ENCOUNTER — Telehealth: Payer: Self-pay

## 2021-08-21 DIAGNOSIS — B372 Candidiasis of skin and nail: Secondary | ICD-10-CM

## 2021-08-21 DIAGNOSIS — Z8639 Personal history of other endocrine, nutritional and metabolic disease: Secondary | ICD-10-CM

## 2021-08-21 NOTE — Telephone Encounter (Signed)
Transition Care Management Follow-up Telephone Call Date of discharge and from where: 08/20/2021 - [facility] - UTI How have you been since you were released from the hospital? Not doing well - very weak and tender from yeast and has a lot of infection, pus coming from vagina - doesn't feel like she is going to get better until this is treated Any questions or concerns? Yes - as noted above.  Items Reviewed: Did the pt receive and understand the discharge instructions provided? Yes  Medications obtained and verified? Yes  Other? No  Any new allergies since your discharge? No  Dietary orders reviewed? Yes Do you have support at home? Yes   Home Care and Equipment/Supplies: Were home health services ordered? no  Were any new equipment or medical supplies ordered?  No  Functional Questionnaire: (I = Independent and D = Dependent) ADLs: D  Bathing/Dressing- D  Meal Prep- D  Eating- I  Maintaining continence- D  Transferring/Ambulation- D  Managing Meds- D  Follow up appointments reviewed:  PCP Hospital f/u appt confirmed? Yes  Scheduled to see [name] on 08/28/2021 @ 1:05. [facility] f/u appt confirmed? Yes  Scheduled to see wound center on 08/23/21 @ 1:15. Are transportation arrangements needed? No  If their condition worsens, is the pt aware to call PCP or go to the Emergency Dept.? Yes Was the patient provided with contact information for the PCP's office or ED? Yes Was to pt encouraged to call back with questions or concerns? Yes

## 2021-08-21 NOTE — Telephone Encounter (Signed)
Please tell patient to call her OBGYN to get an appointment for vaginal exam.

## 2021-08-22 LAB — CULTURE, BLOOD (ROUTINE X 2)
Culture: NO GROWTH
Culture: NO GROWTH
Special Requests: ADEQUATE
Special Requests: ADEQUATE

## 2021-08-22 NOTE — Telephone Encounter (Signed)
[email or web address]

## 2021-08-23 ENCOUNTER — Encounter (HOSPITAL_BASED_OUTPATIENT_CLINIC_OR_DEPARTMENT_OTHER): Payer: Medicaid Other | Admitting: General Surgery

## 2021-08-23 DIAGNOSIS — G8222 Paraplegia, incomplete: Secondary | ICD-10-CM | POA: Diagnosis not present

## 2021-08-23 DIAGNOSIS — Z9049 Acquired absence of other specified parts of digestive tract: Secondary | ICD-10-CM | POA: Diagnosis not present

## 2021-08-23 DIAGNOSIS — L97811 Non-pressure chronic ulcer of other part of right lower leg limited to breakdown of skin: Secondary | ICD-10-CM | POA: Diagnosis not present

## 2021-08-23 DIAGNOSIS — L89154 Pressure ulcer of sacral region, stage 4: Secondary | ICD-10-CM | POA: Diagnosis present

## 2021-08-28 ENCOUNTER — Ambulatory Visit: Payer: Medicaid Other | Admitting: Family Medicine

## 2021-08-28 ENCOUNTER — Encounter: Payer: Self-pay | Admitting: Family Medicine

## 2021-08-28 VITALS — BP 132/88 | HR 95 | Temp 98.6°F

## 2021-08-28 DIAGNOSIS — I10 Essential (primary) hypertension: Secondary | ICD-10-CM

## 2021-08-28 DIAGNOSIS — B3731 Acute candidiasis of vulva and vagina: Secondary | ICD-10-CM

## 2021-08-28 DIAGNOSIS — N319 Neuromuscular dysfunction of bladder, unspecified: Secondary | ICD-10-CM

## 2021-08-28 DIAGNOSIS — E039 Hypothyroidism, unspecified: Secondary | ICD-10-CM | POA: Diagnosis not present

## 2021-08-28 DIAGNOSIS — K219 Gastro-esophageal reflux disease without esophagitis: Secondary | ICD-10-CM

## 2021-08-28 DIAGNOSIS — B372 Candidiasis of skin and nail: Secondary | ICD-10-CM

## 2021-08-28 DIAGNOSIS — Z79899 Other long term (current) drug therapy: Secondary | ICD-10-CM

## 2021-08-28 DIAGNOSIS — Z9359 Other cystostomy status: Secondary | ICD-10-CM

## 2021-08-28 DIAGNOSIS — E782 Mixed hyperlipidemia: Secondary | ICD-10-CM

## 2021-08-28 DIAGNOSIS — T1490XS Injury, unspecified, sequela: Secondary | ICD-10-CM

## 2021-08-28 DIAGNOSIS — G8929 Other chronic pain: Secondary | ICD-10-CM

## 2021-08-28 DIAGNOSIS — L89154 Pressure ulcer of sacral region, stage 4: Secondary | ICD-10-CM

## 2021-08-28 DIAGNOSIS — L24A2 Irritant contact dermatitis due to fecal, urinary or dual incontinence: Secondary | ICD-10-CM

## 2021-08-28 DIAGNOSIS — F339 Major depressive disorder, recurrent, unspecified: Secondary | ICD-10-CM

## 2021-08-28 DIAGNOSIS — T8389XD Other specified complication of genitourinary prosthetic devices, implants and grafts, subsequent encounter: Secondary | ICD-10-CM

## 2021-08-28 DIAGNOSIS — H65192 Other acute nonsuppurative otitis media, left ear: Secondary | ICD-10-CM

## 2021-08-28 DIAGNOSIS — N368 Other specified disorders of urethra: Secondary | ICD-10-CM

## 2021-08-28 DIAGNOSIS — F411 Generalized anxiety disorder: Secondary | ICD-10-CM

## 2021-08-28 MED ORDER — FLUCONAZOLE 150 MG PO TABS: 150.0000 mg | ORAL_TABLET | Freq: Once | ORAL | 0 refills | Status: AC

## 2021-08-28 MED ORDER — OXYCODONE HCL 10 MG PO TABS: 10.0000 mg | ORAL_TABLET | Freq: Three times a day (TID) | ORAL | 0 refills | Status: DC

## 2021-08-28 MED ORDER — KETOROLAC TROMETHAMINE 60 MG/2ML IM SOLN
60.0000 mg | Freq: Once | INTRAMUSCULAR | Status: AC
  Administered 2021-08-28: 60 mg via INTRAMUSCULAR
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
## 2021-08-29 LAB — LIPID PANEL
Chol/HDL Ratio: 4.9 ratio — ABNORMAL HIGH (ref 0.0–4.4)
Cholesterol, Total: 181 mg/dL (ref 100–199)
HDL: 37 mg/dL — ABNORMAL LOW (ref >39)
LDL Chol Calc (NIH): 117 mg/dL — ABNORMAL HIGH (ref 0–99)
Triglycerides: 150 mg/dL — ABNORMAL HIGH (ref 0–149)
VLDL Cholesterol Cal: 27 mg/dL (ref 5–40)

## 2021-08-29 LAB — TSH: TSH: 2.24 u[IU]/mL (ref 0.450–4.500)

## 2021-08-29 LAB — T4, FREE: Free T4: 1.24 ng/dL (ref 0.82–1.77)

## 2021-08-30 ENCOUNTER — Encounter (HOSPITAL_BASED_OUTPATIENT_CLINIC_OR_DEPARTMENT_OTHER): Payer: Medicaid Other | Admitting: General Surgery

## 2021-08-30 DIAGNOSIS — L89154 Pressure ulcer of sacral region, stage 4: Secondary | ICD-10-CM | POA: Diagnosis not present
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
## 2021-08-30 NOTE — Progress Notes (Signed)
CEYDA, PETERKA ([id number]) Visit Report for 08/30/2021 Arrival Information Details Patient Name: Date of Service: [name], [name] 08/30/2021 1:15 PM Medical Record Number: [id number] Patient Account Number: [id number] Date of Birth/Sex: Treating RN: 1980-12-07 (41 y.o. [name] Primary Care Hila Bolding: [name] Other Clinician: Referring Donnivan Villena: Treating Kalei Meda/Extender: [name] in Treatment: 38 Visit Information History Since Last Visit Added or deleted any medications: No Patient Arrived: Wheel Chair Any new allergies or adverse reactions: No Arrival Time: 13:14 Had a fall or experienced change in No Accompanied By: mother/caregiver activities of daily living that may affect Transfer Assistance: Harrel Lemon Lift risk of falls: Patient Identification Verified: Yes Signs or symptoms of abuse/neglect since last visito No Patient Requires Transmission-Based Precautions: No Hospitalized since last visit: No Patient Has Alerts: No Implantable device outside of the clinic excluding No cellular tissue based products placed in the center since last visit: Has Dressing in Place as Prescribed: Yes Pain Present Now: No Electronic Signature(s) Signed: 08/30/2021 6:10:44 PM By: Dellie Catholic RN Entered By: Dellie Catholic on 08/30/2021 13:26:17 -------------------------------------------------------------------------------- Encounter Discharge Information Details Patient Name: Date of Service: [name], Jamie A. 08/30/2021 1:15 PM Medical Record Number: [id number] Patient Account Number: [id number] Date of Birth/Sex: Treating RN: 04/06/1980 (41 y.o. [name] Primary Care Jahaira Earnhart: [name] Other Clinician: Referring Athene Schuhmacher: Treating Sebasthian Stailey/Extender: [name] in Treatment: [phone] Encounter Discharge Information Items Post Procedure Vitals Discharge Condition: Stable Temperature (F):  98.6 Ambulatory Status: Wheelchair Pulse (bpm): 86 Discharge Destination: Home Respiratory Rate (breaths/min): 18 Transportation: Other Blood Pressure (mmHg): 119/82 Accompanied By: mother, caregiver Schedule Follow-up Appointment: Yes Clinical Summary of Care: Patient Declined Electronic Signature(s) Signed: 08/30/2021 5:56:45 PM By: Baruch Gouty RN, BSN Entered By: Baruch Gouty on 08/30/2021 14:05:16 -------------------------------------------------------------------------------- Lower Extremity Assessment Details Patient Name: Date of Service: [name], [name] 08/30/2021 1:15 PM Medical Record Number: [id number] Patient Account Number: [id number] Date of Birth/Sex: Treating RN: Feb 21, 1981 (41 y.o. [name] Primary Care Yasenia Reedy: [name] Other Clinician: Referring Eustolia Drennen: Treating Jariel Drost/Extender: Trude Mcburney Weeks in Treatment: 270 Electronic Signature(s) Signed: 08/30/2021 6:10:44 PM By: Dellie Catholic RN Entered By: Dellie Catholic on 08/30/2021 13:26:54 -------------------------------------------------------------------------------- Multi Wound Chart Details Patient Name: Date of Service: [name], [name] 08/30/2021 1:15 PM Medical Record Number: [id number] Patient Account Number: [id number] Date of Birth/Sex: Treating RN: 04-12-1980 (41 y.o. [name] Primary Care Kalany Diekmann: [name] Other Clinician: Referring Chardonnay Holzmann: Treating Alexsander Cavins/Extender: Trude Mcburney Weeks in Treatment: 270 Vital Signs Height(in): 65 Pulse(bpm): 86 Weight(lbs): 201 Blood Pressure(mmHg): 119/82 Body Mass Index(BMI): 33.4 Temperature(F): 98.6 Respiratory Rate(breaths/min): 18 Photos: [N/A:N/A] Medial Sacrum Right, Posterior Upper Leg N/A Wound Location: Pressure Injury Skin Tear/Laceration N/A Wounding Event: Pressure Ulcer Skin Tear N/A Primary Etiology: Anemia, Osteomyelitis Anemia,  Osteomyelitis N/A Comorbid History: 05/15/2016 05/30/2021 N/A Date Acquired: 270 12 N/A Weeks of Treatment: Open Open N/A Wound Status: No No N/A Wound Recurrence: 1.8x1.1x2.3 0x0x0 N/A Measurements L x W x D (cm) 1.555 0 N/A A (cm) : rea 3.577 0 N/A Volume (cm) : 95.30% 100.00% N/A % Reduction in A rea: 97.50% 100.00% N/A % Reduction in Volume: Position 1 (o'clock): 2.7 Maximum Distance 1 (cm): Yes N/A N/A Tunneling: Category/Stage IV Full Thickness Without Exposed N/A Classification: Support Structures Medium Medium N/A Exudate Amount: Serosanguineous Serous N/A Exudate Type: red, brown amber N/A Exudate Color: Thickened Flat and Intact N/A Wound Margin: Small (1-33%) Medium (34-66%) N/A Granulation Amount: Pink, Pale Pink  N/A Granulation Quality: Large (67-100%) Medium (34-66%) N/A Necrotic Amount: Fat Layer (Subcutaneous Tissue): Yes Fat Layer (Subcutaneous Tissue): Yes N/A Exposed Structures: Fascia: No Fascia: No Tendon: No Tendon: No Muscle: No Muscle: No Joint: No Joint: No Bone: No Bone: No Small (1-33%) Small (1-33%) N/A Epithelialization: Debridement - Excisional N/A N/A Debridement: Pre-procedure Verification/Time Out 13:40 N/A N/A Taken: Lidocaine 4% Topical Solution N/A N/A Pain Control: Subcutaneous N/A N/A Tissue Debrided: Skin/Subcutaneous Tissue N/A N/A Level: 1.98 N/A N/A Debridement A (sq cm): rea Curette N/A N/A Instrument: Minimum N/A N/A Bleeding: Pressure N/A N/A Hemostasis A chieved: Insensate N/A N/A Procedural Pain: Insensate N/A N/A Post Procedural Pain: Procedure was tolerated well N/A N/A Debridement Treatment Response: 1.8x1.1x2.3 N/A N/A Post Debridement Measurements L x W x D (cm) 3.577 N/A N/A Post Debridement Volume: (cm) Category/Stage IV N/A N/A Post Debridement Stage: Debridement N/A N/A Procedures Performed: Treatment Notes Electronic Signature(s) Signed: 08/30/2021 1:48:38 PM By:  Fredirick Maudlin MD FACS Signed: 08/30/2021 5:56:45 PM By: Baruch Gouty RN, BSN Entered By: Fredirick Maudlin on 08/30/2021 13:48:38 -------------------------------------------------------------------------------- Multi-Disciplinary Care Plan Details Patient Name: Date of Service: [name], [name] 08/30/2021 1:15 PM Medical Record Number: [id number] Patient Account Number: [id number] Date of Birth/Sex: Treating RN: Sep 18, 1980 (41 y.o. [name] Primary Care Shalaunda Weatherholtz: [name] Other Clinician: Referring Neelah Mannings: Treating Vester Balthazor/Extender: [name] in Treatment: [place] reviewed with physician Active Inactive Pressure Nursing Diagnoses: Knowledge deficit related to management of pressures ulcers Potential for impaired tissue integrity related to pressure, friction, moisture, and shear Goals: Patient will remain free from development of additional pressure ulcers Date Initiated: 06/27/2016 Date Inactivated: 01/17/2019 Target Resolution Date: 12/24/2018 Goal Status: Met Patient/caregiver will verbalize risk factors for pressure ulcer development Date Initiated: 06/27/2016 Date Inactivated: 07/08/2017 Target Resolution Date: 07/08/2017 Goal Status: Met Patient/caregiver will verbalize understanding of pressure ulcer management Date Initiated: 06/27/2016 Target Resolution Date: 09/19/2021 Goal Status: Active Interventions: Assess: immobility, friction, shearing, incontinence upon admission and as needed Assess offloading mechanisms upon admission and as needed Assess potential for pressure ulcer upon admission and as needed Provide education on pressure ulcers Treatment Activities: Patient referred for pressure reduction/relief devices : 06/27/2016 Pressure reduction/relief device ordered : 06/27/2016 Notes: Wound/Skin Impairment Nursing Diagnoses: Impaired tissue integrity Knowledge deficit related to smoking  impact on wound healing Knowledge deficit related to ulceration/compromised skin integrity Goals: Patient will demonstrate a reduced rate of smoking or cessation of smoking Date Initiated: 06/27/2016 Date Inactivated: 01/26/2017 Target Resolution Date: 01/08/2017 Goal Status: Unmet Unmet Reason: pt continues to smoke Patient/caregiver will verbalize understanding of skin care regimen Date Initiated: 06/27/2016 Target Resolution Date: 09/19/2021 Goal Status: Active Ulcer/skin breakdown will have a volume reduction of 50% by week 8 Date Initiated: 06/27/2016 Date Inactivated: 12/11/2016 Target Resolution Date: 07/25/2016 Goal Status: Met Interventions: Assess patient/caregiver ability to obtain necessary supplies Assess patient/caregiver ability to perform ulcer/skin care regimen upon admission and as needed Assess ulceration(s) every visit Provide education on smoking Provide education on ulcer and skin care Treatment Activities: Skin care regimen initiated : 06/27/2016 Topical wound management initiated : 06/27/2016 Notes: Electronic Signature(s) Signed: 08/30/2021 5:56:45 PM By: Baruch Gouty RN, BSN Entered By: Baruch Gouty on 08/30/2021 13:41:28 -------------------------------------------------------------------------------- Pain Assessment Details Patient Name: Date of Service: [name], Jamie A. 08/30/2021 1:15 PM Medical Record Number: [id number] Patient Account Number: [id number] Date of Birth/Sex: Treating RN: 1980-12-26 (41 y.o. [name] Primary Care Easton Sivertson: [name] Other Clinician: Referring Mubashir Mallek: Treating Sudie Bandel/Extender: Fredirick Maudlin  [name] Weeks in Treatment: 270 Active Problems Location of Pain Severity and Description of Pain Patient Has Paino No Site Locations Pain Management and Medication Current Pain Management: Electronic Signature(s) Signed: 08/30/2021 6:10:44 PM By: Dellie Catholic RN Entered By: Dellie Catholic on 08/30/2021 13:26:48 -------------------------------------------------------------------------------- Patient/Caregiver Education Details Patient Name: Date of Service: Karlton Lemon 6/30/2023andnbsp1:15 PM Medical Record Number: [id number] Patient Account Number: [id number] Date of Birth/Gender: Treating RN: Jan 21, 1981 (41 y.o. [name] Primary Care Physician: [name] Other Clinician: Referring Physician: Treating Physician/Extender: [name] in Treatment: 270 Education Assessment Education Provided To: Patient Education Topics Provided Pressure: Methods: Explain/Verbal Responses: Reinforcements needed, State content correctly Wound/Skin Impairment: Methods: Explain/Verbal Responses: Reinforcements needed, State content correctly Electronic Signature(s) Signed: 08/30/2021 5:56:45 PM By: Baruch Gouty RN, BSN Entered By: Baruch Gouty on 08/30/2021 13:43:04 -------------------------------------------------------------------------------- Wound Assessment Details Patient Name: Date of Service: [name], [name] 08/30/2021 1:15 PM Medical Record Number: [id number] Patient Account Number: [id number] Date of Birth/Sex: Treating RN: [birth year] (41 y.o. [name] Primary Care Jaylan Duggar: [name] Other Clinician: Referring Karuna Balducci: Treating Doloros Kwolek/Extender: Trude Mcburney Weeks in Treatment: 270 Wound Status Wound Number: 1 Primary Etiology: Pressure Ulcer Wound Location: Medial Sacrum Wound Status: Open Wounding Event: Pressure Injury Comorbid History: Anemia, Osteomyelitis Date Acquired: 05/15/2016 Weeks Of Treatment: 270 Clustered Wound: No Photos Wound Measurements Length: (cm) 1.8 Width: (cm) 1.1 Depth: (cm) 2.3 Area: (cm) 1.555 Volume: (cm) 3.577 % Reduction in Area: 95.3% % Reduction in Volume: 97.5% Epithelialization: Small (1-33%) Tunneling: Yes Maximum  Distance: (cm) 2.7 Undermining: No Wound Description Classification: Category/Stage IV Wound Margin: Thickened Exudate Amount: Medium Exudate Type: Serosanguineous Exudate Color: red, brown Foul Odor After Cleansing: No Slough/Fibrino No Wound Bed Granulation Amount: Small (1-33%) Exposed Structure Granulation Quality: Pink, Pale Fascia Exposed: No Necrotic Amount: Large (67-100%) Fat Layer (Subcutaneous Tissue) Exposed: Yes Necrotic Quality: Adherent Slough Tendon Exposed: No Muscle Exposed: No Joint Exposed: No Bone Exposed: No Treatment Notes Wound #1 (Sacrum) Wound Laterality: Medial Cleanser Soap and Water Discharge Instruction: May shower and wash wound with dial antibacterial soap and water prior to dressing change. Wound Cleanser Discharge Instruction: Cleanse the wound with wound cleanser prior to applying a clean dressing using gauze sponges, not tissue or cotton balls. Peri-Wound Care Topical keystone antibiotic compound Discharge Instruction: apply to base pf wound with dressing changes once avaliable Primary Dressing Medline Woven Gauze Sponges 4x4 (in/in) Discharge Instruction: moisten with saline and coat with Keystone Secondary Dressing ABD Pad, 8x10 Discharge Instruction: Apply over primary dressing as directed. Secured With 71M Medipore H Soft Cloth Surgical T ape, 4 x 10 (in/yd) Discharge Instruction: Secure with tape as directed. Compression Wrap Compression Stockings Add-Ons Electronic Signature(s) Signed: 08/30/2021 2:36:56 PM By: Sandre Kitty Signed: 08/30/2021 6:10:44 PM By: Dellie Catholic RN Entered By: Sandre Kitty on 08/30/2021 13:36:27 -------------------------------------------------------------------------------- Wound Assessment Details Patient Name: Date of Service: [name], [name] 08/30/2021 1:15 PM Medical Record Number: [id number] Patient Account Number: [id number] Date of Birth/Sex: Treating RN: 1980/05/23 (41 y.o.  [name] Primary Care Jamie-Lee Galdamez: [name] Other Clinician: Referring Marcelo Ickes: Treating Aimi Essner/Extender: Trude Mcburney Weeks in Treatment: 270 Wound Status Wound Number: 16 Primary Etiology: Skin Tear Wound Location: Right, Posterior Upper Leg Wound Status: Open Wounding Event: Skin Tear/Laceration Comorbid History: Anemia, Osteomyelitis Date Acquired: 05/30/2021 Weeks Of Treatment: 12 Clustered Wound: No Photos Wound Measurements Length: (cm) Width: (cm) Depth: (cm) Area: (cm) Volume: (cm) 0 % Reduction in Area:  100% 0 % Reduction in Volume: 100% 0 Epithelialization: Small (1-33%) 0 0 Wound Description Classification: Full Thickness Without Exposed Support Structures Wound Margin: Flat and Intact Exudate Amount: Medium Exudate Type: Serous Exudate Color: amber Foul Odor After Cleansing: No Slough/Fibrino Yes Wound Bed Granulation Amount: Medium (34-66%) Exposed Structure Granulation Quality: Pink Fascia Exposed: No Necrotic Amount: Medium (34-66%) Fat Layer (Subcutaneous Tissue) Exposed: Yes Necrotic Quality: Adherent Slough Tendon Exposed: No Muscle Exposed: No Joint Exposed: No Bone Exposed: No Electronic Signature(s) Signed: 08/30/2021 2:36:56 PM By: Sandre Kitty Signed: 08/30/2021 5:56:45 PM By: Baruch Gouty RN, BSN Entered By: Sandre Kitty on 08/30/2021 13:32:21 -------------------------------------------------------------------------------- Vitals Details Patient Name: Date of Service: [name] 08/30/2021 1:15 PM Medical Record Number: [id number] Patient Account Number: [id number] Date of Birth/Sex: Treating RN: 03-03-1981 (41 y.o. [name] Primary Care Teresita Fanton: [name] Other Clinician: Referring Naseer Hearn: Treating Hilde Churchman/Extender: Trude Mcburney Weeks in Treatment: 270 Vital Signs Time Taken: 13:16 Temperature (F): 98.6 Height (in): 65 Pulse  (bpm): 86 Weight (lbs): 201 Respiratory Rate (breaths/min): 18 Body Mass Index (BMI): 33.4 Blood Pressure (mmHg): 119/82 Reference Range: 80 - 120 mg / dl Electronic Signature(s) Signed: 08/30/2021 6:10:44 PM By: Dellie Catholic RN Entered By: Dellie Catholic on 08/30/2021 13:26:41

## 2021-09-03 ENCOUNTER — Encounter: Payer: Self-pay | Admitting: Family Medicine

## 2021-09-03 DIAGNOSIS — L24A2 Irritant contact dermatitis due to fecal, urinary or dual incontinence: Secondary | ICD-10-CM | POA: Insufficient documentation

## 2021-09-03 DIAGNOSIS — H65192 Other acute nonsuppurative otitis media, left ear: Secondary | ICD-10-CM | POA: Insufficient documentation

## 2021-09-03 NOTE — Assessment & Plan Note (Signed)
Lives with mom. She does also have a caregiver. She has all the DME she needs.

## 2021-09-03 NOTE — Assessment & Plan Note (Signed)
Managed by urology. 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

## 2021-09-03 NOTE — Assessment & Plan Note (Signed)
Encouraged to continue to monitor. Goal <130/90.

## 2021-09-03 NOTE — Assessment & Plan Note (Signed)
Well-controlled on current regimen. ?

## 2021-09-03 NOTE — Assessment & Plan Note (Signed)
Managed by wound care 

## 2021-09-03 NOTE — Assessment & Plan Note (Signed)
Well controlled on current regimen. Controlled substance agreement in place. Urine drug screen as expected. PDMP reviewed with no concerning findings.

## 2021-09-03 NOTE — Assessment & Plan Note (Signed)
Signed for Oxycodone. Controlled substance agreement in place. Urine drug screen as expected. PDMP reviewed with no concerning findings.

## 2021-09-03 NOTE — Assessment & Plan Note (Signed)
Labs to assess.

## 2021-09-03 NOTE — Assessment & Plan Note (Signed)
Encouraged to use Flonase daily to dry this up.

## 2021-09-03 NOTE — Assessment & Plan Note (Signed)
Recommended End-It cream.

## 2021-09-05 ENCOUNTER — Encounter (HOSPITAL_BASED_OUTPATIENT_CLINIC_OR_DEPARTMENT_OTHER): Payer: Medicaid Other | Attending: General Surgery | Admitting: General Surgery

## 2021-09-05 DIAGNOSIS — L89154 Pressure ulcer of sacral region, stage 4: Secondary | ICD-10-CM | POA: Insufficient documentation

## 2021-09-05 DIAGNOSIS — G8222 Paraplegia, incomplete: Secondary | ICD-10-CM | POA: Insufficient documentation

## 2021-09-09 ENCOUNTER — Other Ambulatory Visit: Payer: Self-pay | Admitting: Family Medicine

## 2021-09-09 DIAGNOSIS — K219 Gastro-esophageal reflux disease without esophagitis: Secondary | ICD-10-CM

## 2021-09-09 DIAGNOSIS — B372 Candidiasis of skin and nail: Secondary | ICD-10-CM

## 2021-09-10 NOTE — Telephone Encounter (Signed)
LOV: 08/28/2021 NOV: 11/26/2021

## 2021-09-13 ENCOUNTER — Encounter (HOSPITAL_BASED_OUTPATIENT_CLINIC_OR_DEPARTMENT_OTHER): Payer: Medicaid Other | Admitting: Internal Medicine

## 2021-09-13 DIAGNOSIS — L89154 Pressure ulcer of sacral region, stage 4: Secondary | ICD-10-CM | POA: Diagnosis not present

## 2021-09-13 NOTE — Progress Notes (Signed)
[name], [name] ([id number]) Visit Report for 09/13/2021 Arrival Information Details Patient Name: Date of Service: [name], [name] 09/13/2021 1:15 PM Medical Record Number: [id number] Patient Account Number: [id number] Date of Birth/Sex: Treating RN: 10-Sep-1980 (41 y.o. [name] Primary Care [name]: [name] Other Clinician: Referring Deshauna Cayson: Treating Valkyrie Guardiola/Extender: Mare Ferrari in Treatment: 98 Visit Information History Since Last Visit Added or deleted any medications: No Patient Arrived: Wheel Chair Any new allergies or adverse reactions: No Arrival Time: 13:15 Had a fall or experienced change in No Accompanied By: friend activities of daily living that may affect Transfer Assistance: [name] Lift risk of falls: Patient Identification Verified: Yes Signs or symptoms of abuse/neglect since last visito No Patient Requires Transmission-Based Precautions: No Hospitalized since last visit: No Patient Has Alerts: No Implantable device outside of the clinic excluding No cellular tissue based products placed in the center since last visit: Has Dressing in Place as Prescribed: Yes Pain Present Now: No Electronic Signature(s) Signed: 09/13/2021 5:55:06 PM By: Dellie Catholic RN Entered By: Dellie Catholic on 09/13/2021 13:28:43 -------------------------------------------------------------------------------- Clinic Level of Care Assessment Details Patient Name: Date of Service: [name], [name] 09/13/2021 1:15 PM Medical Record Number: [id number] Patient Account Number: [id number] Date of Birth/Sex: Treating RN: 1980-06-06 (41 y.o. [name] Primary Care Candon Caras: [name] Other Clinician: Referring Samantha Olivera: Treating Jeet Shough/Extender: Mare Ferrari in Treatment: 63 Clinic Level of Care Assessment Items TOOL 4 Quantity Score X- 1 0 Use when only an EandM is performed on FOLLOW-UP  visit ASSESSMENTS - Nursing Assessment / Reassessment X- 1 10 Reassessment of Co-morbidities (includes updates in patient status) X- 1 5 Reassessment of Adherence to Treatment Plan ASSESSMENTS - Wound and Skin A ssessment / Reassessment X - Simple Wound Assessment / Reassessment - one wound 1 5 '[]'  - 0 Complex Wound Assessment / Reassessment - multiple wounds '[]'  - 0 Dermatologic / Skin Assessment (not related to wound area) ASSESSMENTS - Focused Assessment '[]'  - 0 Circumferential Edema Measurements - multi extremities '[]'  - 0 Nutritional Assessment / Counseling / Intervention '[]'  - 0 Lower Extremity Assessment (monofilament, tuning fork, pulses) '[]'  - 0 Peripheral Arterial Disease Assessment (using hand held doppler) ASSESSMENTS - Ostomy and/or Continence Assessment and Care '[]'  - 0 Incontinence Assessment and Management '[]'  - 0 Ostomy Care Assessment and Management (repouching, etc.) PROCESS - Coordination of Care X - Simple Patient / Family Education for ongoing care 1 15 '[]'  - 0 Complex (extensive) Patient / Family Education for ongoing care X- 1 10 Staff obtains Programmer, systems, Records, T Results / Process Orders est X- 1 10 Staff telephones HHA, Nursing Homes / Clarify orders / etc '[]'  - 0 Routine Transfer to another Facility (non-emergent condition) '[]'  - 0 Routine Hospital Admission (non-emergent condition) '[]'  - 0 New Admissions / Biomedical engineer / Ordering NPWT Apligraf, etc. , '[]'  - 0 Emergency Hospital Admission (emergent condition) X- 1 10 Simple Discharge Coordination '[]'  - 0 Complex (extensive) Discharge Coordination PROCESS - Special Needs '[]'  - 0 Pediatric / Minor Patient Management '[]'  - 0 Isolation Patient Management '[]'  - 0 Hearing / Language / Visual special needs '[]'  - 0 Assessment of Community assistance (transportation, D/C planning, etc.) '[]'  - 0 Additional assistance / Altered mentation '[]'  - 0 Support Surface(s) Assessment (bed, cushion, seat,  etc.) INTERVENTIONS - Wound Cleansing / Measurement X - Simple Wound Cleansing - one wound 1 5 '[]'  - 0 Complex Wound Cleansing - multiple wounds X- 1 5 Wound  Imaging (photographs - any number of wounds) '[]'  - 0 Wound Tracing (instead of photographs) X- 1 5 Simple Wound Measurement - one wound '[]'  - 0 Complex Wound Measurement - multiple wounds INTERVENTIONS - Wound Dressings '[]'  - 0 Small Wound Dressing one or multiple wounds '[]'  - 0 Medium Wound Dressing one or multiple wounds '[]'  - 0 Large Wound Dressing one or multiple wounds '[]'  - 0 Application of Medications - topical '[]'  - 0 Application of Medications - injection INTERVENTIONS - Miscellaneous '[]'  - 0 External ear exam '[]'  - 0 Specimen Collection (cultures, biopsies, blood, body fluids, etc.) '[]'  - 0 Specimen(s) / Culture(s) sent or taken to Lab for analysis '[]'  - 0 Patient Transfer (multiple staff / Civil Service fast streamer / Similar devices) '[]'  - 0 Simple Staple / Suture removal (25 or less) '[]'  - 0 Complex Staple / Suture removal (26 or more) '[]'  - 0 Hypo / Hyperglycemic Management (close monitor of Blood Glucose) '[]'  - 0 Ankle / Brachial Index (ABI) - do not check if billed separately X- 1 5 Vital Signs Has the patient been seen at the hospital within the last three years: Yes Total Score: 85 Level Of Care: New/Established - Level 3 Electronic Signature(s) Signed: 09/13/2021 5:55:06 PM By: Dellie Catholic RN Entered By: Dellie Catholic on 09/13/2021 17:31:15 -------------------------------------------------------------------------------- Encounter Discharge Information Details Patient Name: Date of Service: Jamie Boyden A. 09/13/2021 1:15 PM Medical Record Number: [id number] Patient Account Number: [id number] Date of Birth/Sex: Treating RN: Aug 17, 1980 (41 y.o. [name] Primary Care Woody Kronberg: [name] Other Clinician: Referring Ota Ebersole: Treating Kurstyn Larios/Extender: Mare Ferrari in  Treatment: 272 Encounter Discharge Information Items Discharge Condition: Stable Ambulatory Status: Wheelchair Discharge Destination: Home Transportation: Private Auto Accompanied By: Friend/caregiver Schedule Follow-up Appointment: Yes Clinical Summary of Care: Patient Declined Electronic Signature(s) Signed: 09/13/2021 5:55:06 PM By: Dellie Catholic RN Entered By: Dellie Catholic on 09/13/2021 17:32:10 -------------------------------------------------------------------------------- Lower Extremity Assessment Details Patient Name: Date of Service: ARAYAH, KROUSE 09/13/2021 1:15 PM Medical Record Number: [id number] Patient Account Number: [id number] Date of Birth/Sex: Treating RN: [birth year] (41 y.o. [name] Primary Care Ellorie Kindall: [name] Other Clinician: Referring Kloee Ballew: Treating Mehar Kirkwood/Extender: Mare Ferrari in Treatment: 250 Electronic Signature(s) Signed: 09/13/2021 5:55:06 PM By: Dellie Catholic RN Entered By: Dellie Catholic on 09/13/2021 13:30:11 -------------------------------------------------------------------------------- Multi Wound Chart Details Patient Name: Date of Service: Jamie Boyden A. 09/13/2021 1:15 PM Medical Record Number: [id number] Patient Account Number: [id number] Date of Birth/Sex: Treating RN: 1980/04/19 (41 y.o. [name] Primary Care Evelyne Makepeace: [name] Other Clinician: Referring Natan Hartog: Treating Kourtney Montesinos/Extender: Mare Ferrari in Treatment: 272 Vital Signs Height(in): 65 Pulse(bpm): 91 Weight(lbs): 201 Blood Pressure(mmHg): 139/91 Body Mass Index(BMI): 33.4 Temperature(F): 99.2 Respiratory Rate(breaths/min): 18 Photos: [1:No Photos Medial Sacrum] [N/A:N/A N/A] Wound Location: [1:Pressure Injury] [N/A:N/A] Wounding Event: [1:Pressure Ulcer] [N/A:N/A] Primary Etiology: [1:Anemia, Osteomyelitis] [N/A:N/A] Comorbid History: [1:05/15/2016]  [N/A:N/A] Date Acquired: [1:272] [N/A:N/A] Weeks of Treatment: [1:Open] [N/A:N/A] Wound Status: [1:No] [N/A:N/A] Wound Recurrence: [1:1.8x1.1x1.5] [N/A:N/A] Measurements L x W x D (cm) [1:1.555] [N/A:N/A] A (cm) : rea [1:2.333] [N/A:N/A] Volume (cm) : [1:95.30%] [N/A:N/A] % Reduction in A rea: [1:98.40%] [N/A:N/A] % Reduction in Volume: [1:3] Position 1 (o'clock): [1:4.2] Maximum Distance 1 (cm): [1:Yes] [N/A:N/A] Tunneling: [1:Category/Stage IV] [N/A:N/A] Classification: [1:Medium] [N/A:N/A] Exudate A mount: [1:Serosanguineous] [N/A:N/A] Exudate Type: [1:red, brown] [N/A:N/A] Exudate Color: [1:Thickened] [N/A:N/A] Wound Margin: [1:Medium (34-66%)] [N/A:N/A] Granulation A mount: [1:Pink, Pale] [N/A:N/A] Granulation Quality: [1:Medium (34-66%)] [N/A:N/A] Necrotic A mount: [1:Fat  Layer (Subcutaneous Tissue): Yes N/A] Exposed Structures: [1:Fascia: No Tendon: No Muscle: No Joint: No Bone: No Large (67-100%)] [N/A:N/A] Treatment Notes Electronic Signature(s) Signed: 09/13/2021 4:59:49 PM By: Linton Ham MD Signed: 09/13/2021 5:55:06 PM By: Dellie Catholic RN Entered By: Linton Ham on 09/13/2021 13:47:02 -------------------------------------------------------------------------------- Multi-Disciplinary Care Plan Details Patient Name: Date of Service: [name], Jamie A. 09/13/2021 1:15 PM Medical Record Number: [id number] Patient Account Number: [id number] Date of Birth/Sex: Treating RN: 1981/01/14 (41 y.o. [name] Primary Care Ceylin Dreibelbis: [name] Other Clinician: Referring Tahari Clabaugh: Treating Marva Hendryx/Extender: Mare Ferrari in Treatment: [place] reviewed with physician Active Inactive Pressure Nursing Diagnoses: Knowledge deficit related to management of pressures ulcers Potential for impaired tissue integrity related to pressure, friction, moisture, and shear Goals: Patient will remain free from  development of additional pressure ulcers Date Initiated: 06/27/2016 Date Inactivated: 01/17/2019 Target Resolution Date: 12/24/2018 Goal Status: Met Patient/caregiver will verbalize risk factors for pressure ulcer development Date Initiated: 06/27/2016 Date Inactivated: 07/08/2017 Target Resolution Date: 07/08/2017 Goal Status: Met Patient/caregiver will verbalize understanding of pressure ulcer management Date Initiated: 06/27/2016 Target Resolution Date: 10/31/2021 Goal Status: Active Interventions: Assess: immobility, friction, shearing, incontinence upon admission and as needed Assess offloading mechanisms upon admission and as needed Assess potential for pressure ulcer upon admission and as needed Provide education on pressure ulcers Treatment Activities: Patient referred for pressure reduction/relief devices : 06/27/2016 Pressure reduction/relief device ordered : 06/27/2016 Notes: Wound/Skin Impairment Nursing Diagnoses: Impaired tissue integrity Knowledge deficit related to smoking impact on wound healing Knowledge deficit related to ulceration/compromised skin integrity Goals: Patient will demonstrate a reduced rate of smoking or cessation of smoking Date Initiated: 06/27/2016 Date Inactivated: 01/26/2017 Target Resolution Date: 01/08/2017 Goal Status: Unmet Unmet Reason: pt continues to smoke Patient/caregiver will verbalize understanding of skin care regimen Date Initiated: 06/27/2016 Target Resolution Date: 10/31/2021 Goal Status: Active Ulcer/skin breakdown will have a volume reduction of 50% by week 8 Date Initiated: 06/27/2016 Date Inactivated: 12/11/2016 Target Resolution Date: 07/25/2016 Goal Status: Met Interventions: Assess patient/caregiver ability to obtain necessary supplies Assess patient/caregiver ability to perform ulcer/skin care regimen upon admission and as needed Assess ulceration(s) every visit Provide education on smoking Provide education on ulcer and  skin care Treatment Activities: Skin care regimen initiated : 06/27/2016 Topical wound management initiated : 06/27/2016 Notes: Electronic Signature(s) Signed: 09/13/2021 5:55:06 PM By: Dellie Catholic RN Entered By: Dellie Catholic on 09/13/2021 17:26:11 -------------------------------------------------------------------------------- Pain Assessment Details Patient Name: Date of Service: [name], [name] 09/13/2021 1:15 PM Medical Record Number: [id number] Patient Account Number: [id number] Date of Birth/Sex: Treating RN: 12/20/1980 (41 y.o. [name] Primary Care Meygan Kyser: [name] Other Clinician: Referring Amethyst Gainer: Treating Delanee [name]/Extender: Mare Ferrari in Treatment: 272 Active Problems Location of Pain Severity and Description of Pain Patient Has Paino No Site Locations Pain Management and Medication Current Pain Management: Electronic Signature(s) Signed: 09/13/2021 5:55:06 PM By: Dellie Catholic RN Entered By: Dellie Catholic on 09/13/2021 13:30:02 -------------------------------------------------------------------------------- Patient/Caregiver Education Details Patient Name: Date of Service: [name] 7/14/2023andnbsp1:15 PM Medical Record Number: [id number] Patient Account Number: [id number] Date of Birth/Gender: Treating RN: Apr 01, 1980 (41 y.o. [name] Primary Care Physician: [name] Other Clinician: Referring Physician: Treating Physician/Extender: Mare Ferrari in Treatment: 59 Education Assessment Education Provided To: Patient Education Topics Provided Wound/Skin Impairment: Methods: Explain/Verbal Responses: Return demonstration correctly Electronic Signature(s) Signed: 09/13/2021 5:55:06 PM By: Dellie Catholic RN Entered By: Dellie Catholic on 09/13/2021 17:30:05 -------------------------------------------------------------------------------- Wound  Assessment Details Patient Name: Date of Service: [name], [name] 09/13/2021 1:15 PM Medical Record Number: [id number] Patient Account Number: [id number] Date of Birth/Sex: Treating RN: March 14, 1980 (40 y.o. [name] Primary Care Farrell Broerman: [name] Other Clinician: Referring Adelaide Pfefferkorn: Treating Stellan Vick/Extender: Mare Ferrari in Treatment: 272 Wound Status Wound Number: 1 Primary Etiology: Pressure Ulcer Wound Location: Medial Sacrum Wound Status: Open Wounding Event: Pressure Injury Comorbid History: Anemia, Osteomyelitis Date Acquired: 05/15/2016 Weeks Of Treatment: 272 Clustered Wound: No Wound Measurements Length: (cm) 1.8 Width: (cm) 1.1 Depth: (cm) 1.5 Area: (cm) 1.555 Volume: (cm) 2.333 % Reduction in Area: 95.3% % Reduction in Volume: 98.4% Epithelialization: Large (67-100%) Tunneling: Yes Position (o'clock): 3 Maximum Distance: (cm) 4.2 Undermining: No Wound Description Classification: Category/Stage IV Wound Margin: Thickened Exudate Amount: Medium Exudate Type: Serosanguineous Exudate Color: red, brown Foul Odor After Cleansing: No Slough/Fibrino No Wound Bed Granulation Amount: Medium (34-66%) Exposed Structure Granulation Quality: Pink, Pale Fascia Exposed: No Necrotic Amount: Medium (34-66%) Fat Layer (Subcutaneous Tissue) Exposed: Yes Necrotic Quality: Adherent Slough Tendon Exposed: No Muscle Exposed: No Joint Exposed: No Bone Exposed: No Treatment Notes Wound #1 (Sacrum) Wound Laterality: Medial Cleanser Soap and Water Discharge Instruction: May shower and wash wound with dial antibacterial soap and water prior to dressing change. Wound Cleanser Discharge Instruction: Cleanse the wound with wound cleanser prior to applying a clean dressing using gauze sponges, not tissue or cotton balls. Peri-Wound Care Topical keystone antibiotic compound Discharge Instruction: apply to base pf wound with  dressing changes once avaliable Primary Dressing Secondary Dressing Secured With Compression Wrap Compression Stockings Add-Ons Electronic Signature(s) Signed: 09/13/2021 5:55:06 PM By: Dellie Catholic RN Entered By: Dellie Catholic on 09/13/2021 13:36:09 -------------------------------------------------------------------------------- [place] Details Patient Name: Date of Service: Jamie Boyden A. 09/13/2021 1:15 PM Medical Record Number: [id number] Patient Account Number: [id number] Date of Birth/Sex: Treating RN: [birth year] (41 y.o. [name] Primary Care Undray Allman: [name] Other Clinician: Referring Janani Chamber: Treating Koty Anctil/Extender: Mare Ferrari in Treatment: 272 Vital Signs Time Taken: 13:15 Temperature (F): 99.2 Height (in): 65 Pulse (bpm): 91 Weight (lbs): 201 Respiratory Rate (breaths/min): 18 Body Mass Index (BMI): 33.4 Blood Pressure (mmHg): 139/91 Reference Range: 80 - 120 mg / dl Electronic Signature(s) Signed: 09/13/2021 5:55:06 PM By: Dellie Catholic RN Entered By: Dellie Catholic on 09/13/2021 13:29:10

## 2021-09-17 NOTE — Progress Notes (Signed)
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 had previously treated for an open ulcer, but there is currently no skin opening. The wound on her thigh is very superficial and clean without any slough accumulation. 08/30/2021: The heel looks good today and has not opened. The wound on her thigh is closed. The sacral wound looks better, particularly the periwound skin. Her mother has been applying a mixture of Desitin and miconazole to the periwound and just doing wet-to-dry dressings because we did not want to further irritate the skin with the wound VAC drape. The wound is clean with just a little bit of superficial slough and senescent skin heaped up around the margin. 09/05/2021: The periwound skin at her sacral ulcer site is markedly improved. Immediately surrounding the wound orifice, there is some thick, dried, and scaly skin. There is also some slough buildup on the wound surface. No odor or significant drainage. 7/14; the patient still has the same depth however surrounding granulation looks better. Using wound VAC with topical Keystone antibiotics. She may be eligible for an improved mattress cover and we will look into it Objective Constitutional Patient is hypertensive.. Pulse regular and within target range for patient.[name] Respirations regular, non-labored and within target range.. Temperature is normal and within the target range for the patient.[name] Appears in no distress. Vitals Time Taken: 1:15 PM, Height: 65 in, Weight: 201 lbs,  BMI: 33.4, Temperature: 99.2 F,  Pulse: 91 bpm, Respiratory Rate: 18 breaths/min, Blood Pressure: 139/91 mmHg. General Notes: Wound exam still the same depth however surrounding granulation looks quite a bit more healthy than I am used to seeing. There is no evidence of surrounding infection this does not probe to bone Integumentary (Hair, Skin) Wound #1 status is Open. Original cause of wound was Pressure Injury. The date acquired was: 05/15/2016. The wound has been in treatment 272 weeks. The wound is located on the Medial Sacrum. The wound measures 1.8cm length x 1.1cm width x 1.5cm depth; 1.555cm^2 area and 2.333cm^3 volume. There is Fat Layer (Subcutaneous Tissue) exposed. There is no undermining noted, however, there is tunneling at 3:00 with a maximum distance of 4.2cm. There is a medium amount of serosanguineous drainage noted. The wound margin is thickened. There is medium (34-66%) pink, pale granulation within the wound bed. There is a medium (34-66%) amount of necrotic tissue within the wound bed including Adherent Slough. Assessment Active Problems ICD-10 Pressure ulcer of sacral region, stage 4 Paraplegia, incomplete Plan Follow-up Appointments: Return appointment in 1 month. - *** HOYER**** Dr Celine Ahr Room 3 Friday 10/11/21 at 1:15pm Bathing/ Shower/ Hygiene: May shower with protection but do not get wound dressing(s) wet. Negative Presssure Wound Therapy: Wound Vac to wound continuously at 155m/hg pressure - continue vac Off-Loading: Low air-loss mattress (Group 2) - [employer] Turn and reposition every 2 hours WOUND #1: - Sacrum Wound Laterality: Medial Cleanser: Soap and Water 1 x Per Day/30 Days Discharge Instructions: May shower and wash wound with dial antibacterial soap and water prior to dressing change. Cleanser: Wound Cleanser 1 x Per Day/30 Days Discharge Instructions: Cleanse the wound with wound cleanser prior to applying a clean dressing using gauze sponges,  not tissue or cotton balls. Topical: keystone antibiotic compound 1 x Per Day/30 Days Discharge Instructions: apply to base pf wound with dressing changes once avaliable 1. Absolutely no reason to discontinue the wound VAC at this point. The condition of the wound margins and granulation looks healthy under illumination however we still have not had any improvement in wound depth. There is no evidence of infection 2. Not certain about the adequacy of pressure relief. We will look into see if she is eligible for an additional/new mattress cover 3. I thought we could put her at a month. This is a long-term wound with wound VAC being changed adequately at home Electronic Signature(s) Signed: 09/13/2021 4:59:49 PM By: RLinton HamMD Entered By: RLinton Hamon 09/13/2021 16:40:26 -------------------------------------------------------------------------------- SuperBill Details Patient Name: Date of Service: BFernande BoydenA. 09/13/2021 Medical Record Number: [id number] Account Number: [id number] of Birth/Sex: Treating RN: [birth year](41y.o. FAmerica BrownPrimary Care Provider: JHendricks LimesOther Clinician: Referring Provider: Treating Provider/Extender: RMare Ferrariin Treatment: 272 Diagnosis Coding ICD-10 Codes Code Description L89.154 Pressure ulcer of sacral region, stage 4 G82.22 Paraplegia, incomplete Facility Procedures CPT4 Code: [id number]: 99213 - WOUND CARE VISIT-LEV 3 EST PT Modifier: Quantity: 1 Physician Procedures : CPT4 Code Description Modifier [id number] 28366- WC PHYS LEVEL 3 - EST PT ICD-10 Diagnosis Description L89.154 Pressure ulcer of sacral region, stage 4 G82.22 Paraplegia, incomplete Quantity: 1 Electronic Signature(s) Signed: 09/13/2021 5:55:06 PM By: SDellie CatholicRN Signed: 09/17/2021 8:43:51 AM By: RLinton HamMD Previous Signature: 09/13/2021 4:59:49 PM Version By: RLinton Ham MD Entered By: SDellie Catholicon 09/13/2021 17:31:29

## 2021-10-11 ENCOUNTER — Encounter (HOSPITAL_BASED_OUTPATIENT_CLINIC_OR_DEPARTMENT_OTHER): Payer: Medicaid Other | Attending: General Surgery | Admitting: General Surgery

## 2021-10-11 DIAGNOSIS — G8222 Paraplegia, incomplete: Secondary | ICD-10-CM | POA: Diagnosis not present

## 2021-10-11 DIAGNOSIS — L89154 Pressure ulcer of sacral region, stage 4: Secondary | ICD-10-CM | POA: Insufficient documentation

## 2021-10-11 NOTE — Progress Notes (Signed)
ROSELLE, NORTON ([id number]) Visit Report for 10/11/2021 Arrival Information Details Patient Name: Date of Service: [name], [name] 10/11/2021 1:15 PM Medical [name] Number: [id number] Patient Account Number: [id number] Date of Birth/Sex: Treating RN: [name] (41 y.o. [name], [name] Primary Care [name]: [name] Other Clinician: Referring Kentrell Hallahan: Treating Regene Mccarthy/Extender: [name] in Treatment: 17 Visit Information History Since Last Visit Added or deleted any medications: No Patient Arrived: Wheel Chair Any new allergies or adverse reactions: No Arrival Time: 13:58 Had a fall or experienced change in No Accompanied By: mother,friend activities of daily living that may affect Transfer Assistance: [name] Lift risk of falls: Patient Identification Verified: Yes Signs or symptoms of abuse/neglect since last visito No Patient Requires Transmission-Based Precautions: No Hospitalized since last visit: No Patient Has Alerts: No Implantable device outside of the clinic excluding No cellular tissue based products placed in the center since last visit: Has Dressing in Place as Prescribed: Yes Pain Present Now: No Electronic Signature(s) Signed: 10/11/2021 5:19:23 PM By: Jamie Catholic RN Entered By: [name] on 10/11/2021 13:59:17 -------------------------------------------------------------------------------- Encounter Discharge Information Details Patient Name: Date of Service: Jamie Boyden A. 10/11/2021 1:15 PM Medical [name] Number: [id number] Patient Account Number: [id number] Date of Birth/Sex: Treating RN: [name] (41 y.o. [name] Primary Care [name]: [name] Other Clinician: Referring Linzy Laury: Treating Maury Bamba/Extender: [name] in Treatment: 276 Encounter Discharge Information Items Discharge Condition: Stable Ambulatory Status: Wheelchair Discharge  Destination: Home Transportation: Private Auto Accompanied By: mother and friend Schedule Follow-up Appointment: Yes Clinical Summary of Care: Patient Declined Electronic Signature(s) Signed: 10/11/2021 5:19:23 PM By: Jamie Catholic RN Entered By: [name] on 10/11/2021 17:18:53 -------------------------------------------------------------------------------- Lower Extremity Assessment Details Patient Name: Date of Service: [name], [name] 10/11/2021 1:15 PM Medical [name] Number: [id number] Patient Account Number: [id number] Date of Birth/Sex: Treating RN: [name][birth year] (41 y.o. [name] Primary Care [name]: [name] Other Clinician: Referring Bevan Disney: Treating Kiley Solimine/Extender: [name] in Treatment: 276 Electronic Signature(s) Signed: 10/11/2021 5:19:23 PM By: Jamie Catholic RN Entered By: [name] on 10/11/2021 14:00:03 -------------------------------------------------------------------------------- Multi Wound Chart Details Patient Name: Date of Service: Jamie Boyden A. 10/11/2021 1:15 PM Medical [name] Number: [id number] Patient Account Number: [id number] Date of Birth/Sex: Treating RN: [name] (41 y.o. [name] Primary Care [name]: [name] Other Clinician: Referring [name]: Treating [name]/Extender: [name] in Treatment: 276 Vital Signs Height(in): 65 Pulse(bpm): 98 Weight(lbs): 201 Blood Pressure(mmHg): 132/90 Body Mass Index(BMI): 33.4 Temperature(F): 97.5 Respiratory Rate(breaths/min): 18 Photos: [N/A:N/A] Medial Sacrum N/A N/A Wound Location: Pressure Injury N/A N/A Wounding Event: Pressure Ulcer N/A N/A Primary Etiology: Anemia, Osteomyelitis N/A N/A Comorbid History: 05/15/2016 N/A N/A Date Acquired: 46 N/A N/A [name] of Treatment: Open N/A N/A Wound Status: No N/A N/A Wound Recurrence: 2x1.5x2 N/A N/A Measurements L  x W x D (cm) 2.356 N/A N/A A (cm) : rea 4.712 N/A N/A Volume (cm) : 92.80% N/A N/A % Reduction in A rea: 96.70% N/A N/A % 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 N/A N/A Classification: Medium N/A N/A Exudate A mount: Serosanguineous N/A N/A Exudate Type: red, [name] N/A N/A Exudate Color: Thickened N/A N/A Wound Margin: Medium (34-66%) N/A N/A Granulation A mount: Pink, Pale N/A N/A Granulation Quality: Medium (34-66%) N/A N/A Necrotic A mount: Fat Layer (Subcutaneous Tissue): Yes N/A N/A Exposed Structures: Fascia: No Tendon: No Muscle: No Joint: No Bone: No Large (67-100%) N/A N/A Epithelialization: periwound is macerated N/A N/A Assessment  Notes: Treatment Notes Electronic Signature(s) Signed: 10/11/2021 2:14:26 PM By: Jamie Maudlin MD FACS Signed: 10/11/2021 5:19:23 PM By: Jamie Catholic RN Entered By: [name] on 10/11/2021 14:14:26 -------------------------------------------------------------------------------- Multi-Disciplinary Care Plan Details Patient Name: Date of Service: [name], [name] 10/11/2021 1:15 PM Medical [name] Number: [id number] Patient Account Number: [id number] Date of Birth/Sex: Treating RN: [name][birth year] (41 y.o. [name] Primary Care Tyjuan Demetro: [name] Other Clinician: Referring Madicyn Mesina: Treating Sita Mangen/Extender: [name] in Treatment: [place] reviewed with physician Active Inactive Pressure Nursing Diagnoses: Knowledge deficit related to management of pressures ulcers Potential for impaired tissue integrity related to pressure, friction, moisture, and shear Goals: Patient will remain free from development of additional pressure ulcers Date Initiated: 06/27/2016 Date Inactivated: 01/17/2019 Target Resolution Date: 12/24/2018 Goal Status: Met Patient/caregiver will verbalize risk factors for pressure ulcer development Date Initiated:  06/27/2016 Date Inactivated: 07/08/2017 Target Resolution Date: 07/08/2017 Goal Status: Met Patient/caregiver will verbalize understanding of pressure ulcer management Date Initiated: 06/27/2016 Target Resolution Date: 01/01/2022 Goal Status: Active Interventions: Assess: immobility, friction, shearing, incontinence upon admission and as needed Assess offloading mechanisms upon admission and as needed Assess potential for pressure ulcer upon admission and as needed Provide education on pressure ulcers Treatment Activities: Patient referred for pressure reduction/relief devices : 06/27/2016 Pressure reduction/relief device ordered : 06/27/2016 Notes: Wound/Skin Impairment Nursing Diagnoses: Impaired tissue integrity Knowledge deficit related to smoking impact on wound healing Knowledge deficit related to ulceration/compromised skin integrity Goals: Patient will demonstrate a reduced rate of smoking or cessation of smoking Date Initiated: 06/27/2016 Date Inactivated: 01/26/2017 Target Resolution Date: 01/08/2017 Goal Status: Unmet Unmet Reason: pt continues to smoke Patient/caregiver will verbalize understanding of skin care regimen Date Initiated: 06/27/2016 Target Resolution Date: 01/01/2022 Goal Status: Active Ulcer/skin breakdown will have a volume reduction of 50% by week 8 Date Initiated: 06/27/2016 Date Inactivated: 12/11/2016 Target Resolution Date: 07/25/2016 Goal Status: Met Interventions: Assess patient/caregiver ability to obtain necessary supplies Assess patient/caregiver ability to perform ulcer/skin care regimen upon admission and as needed Assess ulceration(s) every visit Provide education on smoking Provide education on ulcer and skin care Treatment Activities: Skin care regimen initiated : 06/27/2016 Topical wound management initiated : 06/27/2016 Notes: Electronic Signature(s) Signed: 10/11/2021 5:19:23 PM By: Jamie Catholic RN Entered By: [name] on  10/11/2021 17:15:14 -------------------------------------------------------------------------------- Pain Assessment Details Patient Name: Date of Service: [name], [name] 10/11/2021 1:15 PM Medical [name] Number: [id number] Patient Account Number: [id number] Date of Birth/Sex: Treating RN: [birth year] (41 y.o. [name] Primary Care Exavier Lina: [name] Other Clinician: Referring Amulya Quintin: Treating Ever Halberg/Extender: [name] in Treatment: 276 Active Problems Location of Pain Severity and Description of Pain Patient Has Paino No Site Locations Pain Management and Medication Current Pain Management: Electronic Signature(s) Signed: 10/11/2021 5:19:23 PM By: Jamie Catholic RN Entered By: [name] on 10/11/2021 13:59:55 -------------------------------------------------------------------------------- Patient/Caregiver Education Details Patient Name: Date of Service: Karlton [name] 8/11/2023andnbsp1:15 PM Medical [name] Number: [id number] Patient Account Number: [id number] Date of Birth/Gender: Treating RN: [name][birth year] (41 y.o. [name] Primary Care Physician: [name] Other Clinician: Referring Physician: Treating Physician/Extender: [name] in Treatment: [phone] Education Assessment Education Provided To: Patient Education Topics Provided Wound/Skin Impairment: Methods: Explain/Verbal Responses: Return demonstration correctly Electronic Signature(s) Signed: 10/11/2021 5:19:23 PM By: Jamie Catholic RN Entered By: [name] on 10/11/2021 17:15:30 -------------------------------------------------------------------------------- Wound Assessment Details Patient Name: Date of Service: [name], [name] 10/11/2021 1:15 PM Medical [name] Number: [id number] Patient  Account Number: [id number] Date of Birth/Sex: Treating RN: [name][birth year] (41 y.o. [name] Primary Care Amontae Ng: [name] Other Clinician: Referring Trelyn Vanderlinde: Treating Amaryllis Malmquist/Extender: [name] in Treatment: 276 Wound Status Wound Number: 1 Primary Etiology: Pressure Ulcer Wound Location: Medial Sacrum Wound Status: Open Wounding Event: Pressure Injury Comorbid History: Anemia, Osteomyelitis Date Acquired: 05/15/2016 [name] Of Treatment: 276 Clustered Wound: No Photos Wound Measurements Length: (cm) 2 Width: (cm) 1.5 Depth: (cm) 2 Area: (cm) 2.356 Volume: (cm) 4.712 % Reduction in Area: 92.8% % Reduction in Volume: 96.7% Epithelialization: Large (67-100%) Tunneling: No Undermining: No Wound Description Classification: Category/Stage IV Wound Margin: Thickened Exudate Amount: Medium Exudate Type: Serosanguineous Exudate Color: red, [name] Foul Odor After Cleansing: No Slough/Fibrino No Wound Bed Granulation Amount: Medium (34-66%) Exposed Structure Granulation Quality: Pink, Pale Fascia Exposed: No Necrotic Amount: Medium (34-66%) Fat Layer (Subcutaneous Tissue) Exposed: Yes Necrotic Quality: Adherent Slough Tendon Exposed: No Muscle Exposed: No Joint Exposed: No Bone Exposed: No Assessment Notes periwound is macerated Treatment Notes Wound #1 (Sacrum) Wound Laterality: Medial Cleanser Soap and Water Discharge Instruction: May shower and wash wound with dial antibacterial soap and water prior to dressing change. Wound Cleanser Discharge Instruction: Cleanse the wound with wound cleanser prior to applying a clean dressing using gauze sponges, not tissue or cotton balls. Peri-Wound Care Zinc Oxide Ointment 30g tube Discharge Instruction: Apply Zinc Oxide to periwound with each dressing change Topical keystone antibiotic compound Discharge Instruction: place keystone in wound Primary Dressing Secondary Dressing ABD Pad, 8x10 Discharge Instruction: Apply over primary dressing as directed. Woven Gauze  Sponge, Non-Sterile 4x4 in Discharge Instruction: use saline moistened gauze behind the [employer] With [employer] Surgical T 2x10 (in/yd) ape Discharge Instruction: Secure with tape as directed. Compression Wrap Compression Stockings Add-Ons Electronic Signature(s) Signed: 10/11/2021 5:19:23 PM By: Jamie Catholic RN Entered By: [name] on 10/11/2021 14:02:53 -------------------------------------------------------------------------------- Vitals Details Patient Name: Date of Service: Jamie Boyden A. 10/11/2021 1:15 PM Medical [name] Number: [id number] Patient Account Number: [id number] Date of Birth/Sex: Treating RN: Jamie [birth year], [name] (41 y.o. [name] Primary Care Reg Bircher: [name] Other Clinician: Referring Rockie Schnoor: Treating Landry Lookingbill/Extender: [name] in Treatment: 276 Vital Signs Time Taken: 13:57 Temperature (F): 97.5 Height (in): 65 Pulse (bpm): 98 Weight (lbs): 201 Respiratory Rate (breaths/min): 18 Body Mass Index (BMI): 33.4 Blood Pressure (mmHg): 132/90 Reference Range: 80 - 120 mg / dl Electronic Signature(s) Signed: 10/11/2021 5:19:23 PM By: Jamie Catholic RN Entered By: [name] on 10/11/2021 13:59:48
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
## 2021-10-17 ENCOUNTER — Telehealth: Payer: Self-pay | Admitting: Family Medicine

## 2021-10-17 DIAGNOSIS — R531 Weakness: Secondary | ICD-10-CM

## 2021-10-17 DIAGNOSIS — T1490XS Injury, unspecified, sequela: Secondary | ICD-10-CM

## 2021-10-17 NOTE — Telephone Encounter (Signed)
REFERRAL REQUEST Telephone Note  Have you been seen at our office for this problem? yes (Advise that they may need an appointment with their PCP before a referral can be done)  Reason for Referral: pt needs referral for therapy. She needs therapy for upper and lower body. Heather next door said she needs new referral Referral discussed with patient: yes  Best contact number of patient for referral team: [phone]    Has patient been seen by a specialist for this issue before: yes  Patient provider preference for referral: physical therapy next door Patient location preference for referral: next door   Patient notified that referrals can take up to a week or longer to process. If they haven't heard anything within a week they should call back and speak with the referral department.

## 2021-10-17 NOTE — Telephone Encounter (Signed)
this 1 may have to be deferred until PCP is back because she knows her better.

## 2021-10-18 NOTE — Telephone Encounter (Signed)
Referral placed.

## 2021-10-25 ENCOUNTER — Encounter (HOSPITAL_BASED_OUTPATIENT_CLINIC_OR_DEPARTMENT_OTHER): Payer: Medicaid Other | Admitting: General Surgery

## 2021-10-25 DIAGNOSIS — L89154 Pressure ulcer of sacral region, stage 4: Secondary | ICD-10-CM | POA: Diagnosis not present
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
## 2021-10-25 NOTE — Progress Notes (Signed)
KREE, ARMATO ([id number]) Visit Report for 10/25/2021 Arrival Information Details Patient Name: Date of Service: IOMA, CHISMAR 10/25/2021 1:15 PM Medical Record Number: [id number] Patient Account Number: [id number] Date of Birth/Sex: Treating RN: 1980/07/06 (41 y.o. [name] Primary Care Abrar Koone: [name] Other Clinician: Referring Aadil Sur: Treating Afiya Ferrebee/Extender: [name] in Treatment: 71 Visit Information History Since Last Visit Added or deleted any medications: No Patient Arrived: Wheel Chair Any new allergies or adverse reactions: No Arrival Time: 13:15 Had a fall or experienced change in No Accompanied By: mother activities of daily living that may affect Transfer Assistance: Harrel Lemon Lift risk of falls: Patient Identification Verified: Yes Signs or symptoms of abuse/neglect since last visito No Secondary Verification Process Completed: Yes Hospitalized since last visit: No Patient Requires Transmission-Based Precautions: No Implantable device outside of the clinic excluding No Patient Has Alerts: No cellular tissue based products placed in the center since last visit: Has Dressing in Place as Prescribed: Yes Pain Present Now: No Electronic Signature(s) Signed: 10/25/2021 5:36:58 PM By: Baruch Gouty RN, BSN Entered By: Baruch Gouty on 10/25/2021 13:19:32 -------------------------------------------------------------------------------- Encounter Discharge Information Details Patient Name: Date of Service: [name] 10/25/2021 1:15 PM Medical Record Number: [id number] Patient Account Number: [id number] Date of Birth/Sex: Treating RN: 11-26-1980 (41 y.o. [name] Primary Care Kamonte Mcmichen: [name] Other Clinician: Referring Neri Samek: Treating Tabbetha Kutscher/Extender: [name] in Treatment: 278 Encounter Discharge Information Items Post Procedure  Vitals Discharge Condition: Stable Temperature (F): 98.1 Ambulatory Status: Wheelchair Pulse (bpm): 87 Discharge Destination: Home Respiratory Rate (breaths/min): 18 Transportation: Other Blood Pressure (mmHg): 130/84 Accompanied By: mother Schedule Follow-up Appointment: Yes Clinical Summary of Care: Patient Declined Notes transportation service Electronic Signature(s) Signed: 10/25/2021 5:36:58 PM By: Baruch Gouty RN, BSN Entered By: Baruch Gouty on 10/25/2021 14:05:18 -------------------------------------------------------------------------------- Lower Extremity Assessment Details Patient Name: Date of Service: AVIV, ROTA 10/25/2021 1:15 PM Medical Record Number: [id number] Patient Account Number: [id number] Date of Birth/Sex: Treating RN: Oct 25, 1980 (41 y.o. [name] Primary Care Kanya Potteiger: [name] Other Clinician: Referring Robben Jagiello: Treating Nitisha Civello/Extender: Trude Mcburney Weeks in Treatment: 278 Electronic Signature(s) Signed: 10/25/2021 5:36:58 PM By: Baruch Gouty RN, BSN Entered By: Baruch Gouty on 10/25/2021 13:20:46 -------------------------------------------------------------------------------- Multi Wound Chart Details Patient Name: Date of Service: BRIZEIDA, MCMURRY 10/25/2021 1:15 PM Medical Record Number: [id number] Patient Account Number: [id number] Date of Birth/Sex: Treating RN: Jul 02, 1980 (41 y.o. [name] Primary Care Azrael Maddix: [name] Other Clinician: Referring Keyairra Kolinski: Treating Tremond Shimabukuro/Extender: Trude Mcburney Weeks in Treatment: 278 Vital Signs Height(in): 65 Pulse(bpm): 87 Weight(lbs): 201 Blood Pressure(mmHg): 130/84 Body Mass Index(BMI): 33.4 Temperature(F): 98.1 Respiratory Rate(breaths/min): 18 Photos: [N/A:N/A] Medial Sacrum N/A N/A Wound Location: Pressure Injury N/A N/A Wounding Event: Pressure Ulcer N/A N/A Primary  Etiology: Anemia, Osteomyelitis N/A N/A Comorbid History: 05/15/2016 N/A N/A Date Acquired: 278 N/A N/A Weeks of Treatment: Open N/A N/A Wound Status: No N/A N/A Wound Recurrence: 2.3x1.3x2 N/A N/A Measurements L x W x D (cm) 2.348 N/A N/A A (cm) : rea 4.697 N/A N/A Volume (cm) : 92.90% N/A N/A % Reduction in A rea: 96.80% N/A N/A % Reduction in Volume: 3 Starting Position 1 (o'clock): 4 Ending Position 1 (o'clock): 3 Maximum Distance 1 (cm): Yes N/A N/A Undermining: Category/Stage IV N/A N/A Classification: Medium N/A N/A Exudate A mount: Serosanguineous N/A N/A Exudate Type: red, brown N/A N/A Exudate Color: Epibole N/A N/A Wound Margin: Large (67-100%) N/A N/A Granulation Amount:  Red N/A N/A Granulation Quality: Small (1-33%) N/A N/A Necrotic Amount: Fat Layer (Subcutaneous Tissue): Yes N/A N/A Exposed Structures: Fascia: No Tendon: No Muscle: No Joint: No Bone: No None N/A N/A Epithelialization: Debridement - Selective/Open Wound N/A N/A Debridement: Pre-procedure Verification/Time Out 13:45 N/A N/A Taken: Lidocaine 4% Topical Solution N/A N/A Pain Control: Callus N/A N/A Tissue Debrided: Skin/Epidermis N/A N/A Level: 2.99 N/A N/A Debridement A (sq cm): rea Curette N/A N/A Instrument: Minimum N/A N/A Bleeding: Pressure N/A N/A Hemostasis A chieved: 0 N/A N/A Procedural Pain: 0 N/A N/A Post Procedural Pain: Procedure was tolerated well N/A N/A Debridement Treatment Response: 2.3x1.3x2 N/A N/A Post Debridement Measurements L x W x D (cm) 4.697 N/A N/A Post Debridement Volume: (cm) Category/Stage IV N/A N/A Post Debridement Stage: Debridement N/A N/A Procedures Performed: Treatment Notes Electronic Signature(s) Signed: 10/25/2021 1:52:45 PM By: Fredirick Maudlin MD FACS Signed: 10/25/2021 5:36:58 PM By: Baruch Gouty RN, BSN Entered By: Fredirick Maudlin on 10/25/2021  13:52:45 -------------------------------------------------------------------------------- Multi-Disciplinary Care Plan Details Patient Name: Date of Service: IYANNI, HEPP 10/25/2021 1:15 PM Medical Record Number: [id number] Patient Account Number: [id number] Date of Birth/Sex: Treating RN: 02/16/1981 (41 y.o. [name] Primary Care Ragan Duhon: [name] Other Clinician: Referring Jewelene Mairena: Treating Gillis Boardley/Extender: [name] in Treatment: [place] reviewed with physician Active Inactive Pressure Nursing Diagnoses: Knowledge deficit related to management of pressures ulcers Potential for impaired tissue integrity related to pressure, friction, moisture, and shear Goals: Patient will remain free from development of additional pressure ulcers Date Initiated: 06/27/2016 Date Inactivated: 01/17/2019 Target Resolution Date: 12/24/2018 Goal Status: Met Patient/caregiver will verbalize risk factors for pressure ulcer development Date Initiated: 06/27/2016 Date Inactivated: 07/08/2017 Target Resolution Date: 07/08/2017 Goal Status: Met Patient/caregiver will verbalize understanding of pressure ulcer management Date Initiated: 06/27/2016 Target Resolution Date: 01/01/2022 Goal Status: Active Interventions: Assess: immobility, friction, shearing, incontinence upon admission and as needed Assess offloading mechanisms upon admission and as needed Assess potential for pressure ulcer upon admission and as needed Provide education on pressure ulcers Treatment Activities: Patient referred for pressure reduction/relief devices : 06/27/2016 Pressure reduction/relief device ordered : 06/27/2016 Notes: Wound/Skin Impairment Nursing Diagnoses: Impaired tissue integrity Knowledge deficit related to smoking impact on wound healing Knowledge deficit related to ulceration/compromised skin integrity Goals: Patient will demonstrate  a reduced rate of smoking or cessation of smoking Date Initiated: 06/27/2016 Date Inactivated: 01/26/2017 Target Resolution Date: 01/08/2017 Goal Status: Unmet Unmet Reason: pt continues to smoke Patient/caregiver will verbalize understanding of skin care regimen Date Initiated: 06/27/2016 Target Resolution Date: 01/01/2022 Goal Status: Active Ulcer/skin breakdown will have a volume reduction of 50% by week 8 Date Initiated: 06/27/2016 Date Inactivated: 12/11/2016 Target Resolution Date: 07/25/2016 Goal Status: Met Interventions: Assess patient/caregiver ability to obtain necessary supplies Assess patient/caregiver ability to perform ulcer/skin care regimen upon admission and as needed Assess ulceration(s) every visit Provide education on smoking Provide education on ulcer and skin care Treatment Activities: Skin care regimen initiated : 06/27/2016 Topical wound management initiated : 06/27/2016 Notes: Electronic Signature(s) Signed: 10/25/2021 5:36:58 PM By: Baruch Gouty RN, BSN Entered By: Baruch Gouty on 10/25/2021 13:45:05 -------------------------------------------------------------------------------- Pain Assessment Details Patient Name: Date of Service: CHEYRL, BULEY A. 10/25/2021 1:15 PM Medical Record Number: [id number] Patient Account Number: [id number] Date of Birth/Sex: Treating RN: May 10, 1980 (41 y.o. [name] Primary Care Jaysean Manville: [name] Other Clinician: Referring Dee Maday: Treating Khaleed Holan/Extender: [name] in Treatment: 278 Active Problems Location of Pain Severity and Description of  Pain Patient Has Paino No Site Locations Rate the pain. Rate the pain. Current Pain Level: 0 Pain Management and Medication Current Pain Management: Electronic Signature(s) Signed: 10/25/2021 5:36:58 PM By: Baruch Gouty RN, BSN Entered By: Baruch Gouty on 10/25/2021  13:20:35 -------------------------------------------------------------------------------- Patient/Caregiver Education Details Patient Name: Date of Service: Karlton Lemon 8/25/2023andnbsp1:15 PM Medical Record Number: [id number] Patient Account Number: [id number] Date of Birth/Gender: Treating RN: 10/24/1980 (41 y.o. [name] Primary Care Physician: [name] Other Clinician: Referring Physician: Treating Physician/Extender: [name] in Treatment: 72 Education Assessment Education Provided To: Patient Education Topics Provided Pressure: Methods: Explain/Verbal Responses: Reinforcements needed, State content correctly Wound/Skin Impairment: Methods: Explain/Verbal Responses: Reinforcements needed, State content correctly Electronic Signature(s) Signed: 10/25/2021 5:36:58 PM By: Baruch Gouty RN, BSN Entered By: Baruch Gouty on 10/25/2021 13:45:33 -------------------------------------------------------------------------------- Wound Assessment Details Patient Name: Date of Service: JENELLE, DRENNON A. 10/25/2021 1:15 PM Medical Record Number: [id number] Patient Account Number: [id number] Date of Birth/Sex: Treating RN: [birth year] (41 y.o. [name] Primary Care Raechel Marcos: [name] Other Clinician: Referring Kenric Ginger: Treating Dawon Troop/Extender: Trude Mcburney Weeks in Treatment: 278 Wound Status Wound Number: 1 Primary Etiology: Pressure Ulcer Wound Location: Medial Sacrum Wound Status: Open Wounding Event: Pressure Injury Comorbid History: Anemia, Osteomyelitis Date Acquired: 05/15/2016 Weeks Of Treatment: 278 Clustered Wound: No Photos Wound Measurements Length: (cm) 2.3 Width: (cm) 1.3 Depth: (cm) 2 Area: (cm) 2.348 Volume: (cm) 4.697 % Reduction in Area: 92.9% % Reduction in Volume: 96.8% Epithelialization: None Tunneling: No Undermining: Yes Starting Position  (o'clock): 3 Ending Position (o'clock): 4 Maximum Distance: (cm) 3 Wound Description Classification: Category/Stage IV Wound Margin: Epibole Exudate Amount: Medium Exudate Type: Serosanguineous Exudate Color: red, brown Foul Odor After Cleansing: No Slough/Fibrino No Wound Bed Granulation Amount: Large (67-100%) Exposed Structure Granulation Quality: Red Fascia Exposed: No Necrotic Amount: Small (1-33%) Fat Layer (Subcutaneous Tissue) Exposed: Yes Necrotic Quality: Adherent Slough Tendon Exposed: No Muscle Exposed: No Joint Exposed: No Bone Exposed: No Treatment Notes Wound #1 (Sacrum) Wound Laterality: Medial Cleanser Soap and Water Discharge Instruction: May shower and wash wound with dial antibacterial soap and water prior to dressing change. Wound Cleanser Discharge Instruction: Cleanse the wound with wound cleanser prior to applying a clean dressing using gauze sponges, not tissue or cotton balls. Peri-Wound Care Zinc Oxide Ointment 30g tube Discharge Instruction: Apply Zinc Oxide to periwound with each dressing change Topical keystone antibiotic compound Discharge Instruction: place keystone in base of wound Primary Dressing Anasept Antimicrobial Wound Gel, 1.5 (oz) tube Discharge Instruction: moisten gauze with anasept and pack wound Secondary Dressing ABD Pad, 8x10 Discharge Instruction: Apply over primary dressing as directed. Secured With 70M Medipore H Soft Cloth Surgical T ape, 4 x 10 (in/yd) Discharge Instruction: Secure with tape as directed. Compression Wrap Compression Stockings Add-Ons Electronic Signature(s) Signed: 10/25/2021 5:36:58 PM By: Baruch Gouty RN, BSN Entered By: Baruch Gouty on 10/25/2021 13:38:06 -------------------------------------------------------------------------------- Vitals Details Patient Name: Date of Service: MAKALA, FETTEROLF 10/25/2021 1:15 PM Medical Record Number: [id number] Patient Account Number:  [id number] Date of Birth/Sex: Treating RN: 07/05/1980 (41 y.o. [name] Primary Care Ovila Lepage: [name] Other Clinician: Referring Adriena Manfre: Treating Liston Thum/Extender: Trude Mcburney Weeks in Treatment: 278 Vital Signs Time Taken: 13:19 Temperature (F): 98.1 Height (in): 65 Pulse (bpm): 87 Source: Stated Respiratory Rate (breaths/min): 18 Weight (lbs): 201 Blood Pressure (mmHg): 130/84 Source: Stated Reference Range: 80 - 120 mg / dl Body Mass Index (BMI): 33.4 Electronic Signature(s) Signed: 10/25/2021  5:36:58 PM By: Baruch Gouty RN, BSN Entered By: Baruch Gouty on 10/25/2021 13:20:27

## 2021-10-28 ENCOUNTER — Ambulatory Visit: Payer: Medicaid Other

## 2021-10-29 NOTE — Therapy (Signed)
Patient is being referred to another location for specialized care following her spinal cord injury 5 years ago.   [name], PT, DPT

## 2021-11-01 ENCOUNTER — Other Ambulatory Visit: Payer: Self-pay | Admitting: Family Medicine

## 2021-11-08 ENCOUNTER — Encounter (HOSPITAL_BASED_OUTPATIENT_CLINIC_OR_DEPARTMENT_OTHER): Payer: Medicaid Other | Attending: General Surgery | Admitting: General Surgery

## 2021-11-08 DIAGNOSIS — L89154 Pressure ulcer of sacral region, stage 4: Secondary | ICD-10-CM | POA: Insufficient documentation

## 2021-11-08 DIAGNOSIS — M4628 Osteomyelitis of vertebra, sacral and sacrococcygeal region: Secondary | ICD-10-CM | POA: Diagnosis not present

## 2021-11-08 DIAGNOSIS — G8222 Paraplegia, incomplete: Secondary | ICD-10-CM | POA: Insufficient documentation

## 2021-11-08 DIAGNOSIS — Z87891 Personal history of nicotine dependence: Secondary | ICD-10-CM | POA: Diagnosis not present

## 2021-11-08 NOTE — Progress Notes (Signed)
[name], [name] ([id number]) Visit Report for 11/08/2021 Arrival Information Details Patient Name: Date of Service: [name], [name] 11/08/2021 2:45 PM Medical Record Number: [id number] Patient Account Number: [id number] Date of Birth/Sex: Treating RN: 09-Nov-1980 (41 y.o. [name] Primary Care Odus Clasby: [name] Other Clinician: Referring Pallas Wahlert: Treating Djeneba Barsch/Extender: [name] in Treatment: 37 Visit Information History Since Last Visit Added or deleted any medications: No Patient Arrived: Wheel Chair Any new allergies or adverse reactions: No Arrival Time: 15:08 Had a fall or experienced change in No Accompanied By: self activities of daily living that may affect Transfer Assistance: None risk of falls: Patient Identification Verified: Yes Signs or symptoms of abuse/neglect since last visito No Secondary Verification Process Completed: Yes Hospitalized since last visit: No Patient Requires Transmission-Based Precautions: No Implantable device outside of the clinic excluding No Patient Has Alerts: No cellular tissue based products placed in the center since last visit: Has Dressing in Place as Prescribed: Yes Pain Present Now: Yes Electronic Signature(s) Signed: 11/08/2021 6:08:36 PM By: Baruch Gouty RN, BSN Entered By: Baruch Gouty on 11/08/2021 15:09:44 -------------------------------------------------------------------------------- Encounter Discharge Information Details Patient Name: Date of Service: Jamie Boyden A. 11/08/2021 2:45 PM Medical Record Number: [id number] Patient Account Number: [id number] Date of Birth/Sex: Treating RN: 04/25/1980 (41 y.o. [name] Primary Care Braelen Sproule: [name] Other Clinician: Referring Brack Shaddock: Treating Diogenes Whirley/Extender: [name] in Treatment: [phone] Encounter Discharge Information Items Post Procedure Vitals Discharge  Condition: Stable Temperature (F): 98.3 Ambulatory Status: Wheelchair Pulse (bpm): 89 Discharge Destination: Home Respiratory Rate (breaths/min): 18 Transportation: Other Blood Pressure (mmHg): 150/82 Accompanied By: self Schedule Follow-up Appointment: Yes Clinical Summary of Care: Patient Declined Notes transportation service Electronic Signature(s) Signed: 11/08/2021 6:08:36 PM By: Baruch Gouty RN, BSN Entered By: Baruch Gouty on 11/08/2021 15:50:14 -------------------------------------------------------------------------------- Lower Extremity Assessment Details Patient Name: Date of Service: [name], [name] 11/08/2021 2:45 PM Medical Record Number: [id number] Patient Account Number: [id number] Date of Birth/Sex: Treating RN: 1980-08-16 (41 y.o. [name] Primary Care Caycee Wanat: [name] Other Clinician: Referring Rowyn Spilde: Treating Courteney Alderete/Extender: Trude Mcburney Weeks in Treatment: 280 Electronic Signature(s) Signed: 11/08/2021 6:08:36 PM By: Baruch Gouty RN, BSN Entered By: Baruch Gouty on 11/08/2021 15:11:07 -------------------------------------------------------------------------------- Multi Wound Chart Details Patient Name: Date of Service: Jamie Boyden A. 11/08/2021 2:45 PM Medical Record Number: [id number] Patient Account Number: [id number] Date of Birth/Sex: Treating RN: January 27, 1981 (41 y.o. F) Primary Care Corran Lalone: [name] Other Clinician: Referring Datron Brakebill: Treating Chau Savell/Extender: [name] in Treatment: 280 Vital Signs Height(in): 65 Pulse(bpm): 89 Weight(lbs): 201 Blood Pressure(mmHg): 150/82 Body Mass Index(BMI): 33.4 Temperature(F): 98.3 Respiratory Rate(breaths/min): 18 Photos: [N/A:N/A] Medial Sacrum N/A N/A Wound Location: Pressure Injury N/A N/A Wounding Event: Pressure Ulcer N/A N/A Primary Etiology: Anemia, Osteomyelitis N/A N/A Comorbid  History: 05/15/2016 N/A N/A Date Acquired: 280 N/A N/A Weeks of Treatment: Open N/A N/A Wound Status: No N/A N/A Wound Recurrence: 2.9x2x2.1 N/A N/A Measurements L x W x D (cm) 4.555 N/A N/A A (cm) : rea 9.566 N/A N/A Volume (cm) : 86.20% N/A N/A % Reduction in A rea: 93.40% N/A N/A % Reduction in Volume: 1 Starting Position 1 (o'clock): 4 Ending Position 1 (o'clock): 1.7 Maximum Distance 1 (cm): Yes N/A N/A Undermining: Category/Stage IV N/A N/A Classification: Medium N/A N/A Exudate A mount: Serosanguineous N/A N/A Exudate Type: red, brown N/A N/A Exudate Color: Well defined, not attached N/A N/A Wound Margin: Large (67-100%) N/A N/A Granulation Amount:  Red N/A N/A Granulation Quality: Small (1-33%) N/A N/A Necrotic Amount: Fat Layer (Subcutaneous Tissue): Yes N/A N/A Exposed Structures: Fascia: No Tendon: No Muscle: No Joint: No Bone: No None N/A N/A Epithelialization: Debridement - Selective/Open Wound N/A N/A Debridement: Pre-procedure Verification/Time Out 15:30 N/A N/A Taken: Lidocaine 4% Topical Solution N/A N/A Pain Control: Skin/Epidermis N/A N/A Level: 5.8 N/A N/A Debridement A (sq cm): rea Curette N/A N/A Instrument: Minimum N/A N/A Bleeding: Pressure N/A N/A Hemostasis A chieved: 0 N/A N/A Procedural Pain: 0 N/A N/A Post Procedural Pain: Procedure was tolerated well N/A N/A Debridement Treatment Response: 2.9x2x2.1 N/A N/A Post Debridement Measurements L x W x D (cm) 9.566 N/A N/A Post Debridement Volume: (cm) Category/Stage IV N/A N/A Post Debridement Stage: Debridement N/A N/A Procedures Performed: Treatment Notes Electronic Signature(s) Signed: 11/08/2021 3:42:36 PM By: Fredirick Maudlin MD FACS Entered By: Fredirick Maudlin on 11/08/2021 15:42:36 -------------------------------------------------------------------------------- Multi-Disciplinary Care Plan Details Patient Name: Date of Service: Jamie Boyden  A. 11/08/2021 2:45 PM Medical Record Number: [id number] Patient Account Number: [id number] Date of Birth/Sex: Treating RN: Jun 24, 1980 (41 y.o. [name] Primary Care Jeffie Widdowson: [name] Other Clinician: Referring Danel Studzinski: Treating Cleta Heatley/Extender: [name] in Treatment: [place] reviewed with physician Active Inactive Pressure Nursing Diagnoses: Knowledge deficit related to management of pressures ulcers Potential for impaired tissue integrity related to pressure, friction, moisture, and shear Goals: Patient will remain free from development of additional pressure ulcers Date Initiated: 06/27/2016 Date Inactivated: 01/17/2019 Target Resolution Date: 12/24/2018 Goal Status: Met Patient/caregiver will verbalize risk factors for pressure ulcer development Date Initiated: 06/27/2016 Date Inactivated: 07/08/2017 Target Resolution Date: 07/08/2017 Goal Status: Met Patient/caregiver will verbalize understanding of pressure ulcer management Date Initiated: 06/27/2016 Target Resolution Date: 01/01/2022 Goal Status: Active Interventions: Assess: immobility, friction, shearing, incontinence upon admission and as needed Assess offloading mechanisms upon admission and as needed Assess potential for pressure ulcer upon admission and as needed Provide education on pressure ulcers Treatment Activities: Patient referred for pressure reduction/relief devices : 06/27/2016 Pressure reduction/relief device ordered : 06/27/2016 Notes: Wound/Skin Impairment Nursing Diagnoses: Impaired tissue integrity Knowledge deficit related to smoking impact on wound healing Knowledge deficit related to ulceration/compromised skin integrity Goals: Patient will demonstrate a reduced rate of smoking or cessation of smoking Date Initiated: 06/27/2016 Date Inactivated: 01/26/2017 Target Resolution Date: 01/08/2017 Goal Status: Unmet Unmet Reason: pt  continues to smoke Patient/caregiver will verbalize understanding of skin care regimen Date Initiated: 06/27/2016 Target Resolution Date: 01/01/2022 Goal Status: Active Ulcer/skin breakdown will have a volume reduction of 50% by week 8 Date Initiated: 06/27/2016 Date Inactivated: 12/11/2016 Target Resolution Date: 07/25/2016 Goal Status: Met Interventions: Assess patient/caregiver ability to obtain necessary supplies Assess patient/caregiver ability to perform ulcer/skin care regimen upon admission and as needed Assess ulceration(s) every visit Provide education on smoking Provide education on ulcer and skin care Treatment Activities: Skin care regimen initiated : 06/27/2016 Topical wound management initiated : 06/27/2016 Notes: Electronic Signature(s) Signed: 11/08/2021 6:08:36 PM By: Baruch Gouty RN, BSN Entered By: Baruch Gouty on 11/08/2021 15:22:52 -------------------------------------------------------------------------------- Pain Assessment Details Patient Name: Date of Service: Jamie Boyden A. 11/08/2021 2:45 PM Medical Record Number: [id number] Patient Account Number: [id number] Date of Birth/Sex: Treating RN: [birth year] (41 y.o. [name] Primary Care Rishik Tubby: [name] Other Clinician: Referring Polly Barner: Treating Cleora Karnik/Extender: [name] in Treatment: 280 Active Problems Location of Pain Severity and Description of Pain Patient Has Paino Yes Site Locations Pain Location: Pain Location: Generalized Pain, Pain  in Ulcers With Dressing Change: No Duration of the Pain. Constant / Intermittento Intermittent Rate the pain. Current Pain Level: 3 Character of Pain Describe the Pain: Aching Pain Management and Medication Current Pain Management: Medication: Yes Other: reposition Is the Current Pain Management Adequate: Adequate How does your wound impact your activities of daily livingo Sleep: No Bathing:  No Appetite: No Relationship With Others: No Bladder Continence: No Emotions: No Bowel Continence: No Hobbies: No Toileting: No Dressing: No Electronic Signature(s) Signed: 11/08/2021 6:08:36 PM By: Baruch Gouty RN, BSN Entered By: Baruch Gouty on 11/08/2021 15:11:00 -------------------------------------------------------------------------------- Patient/Caregiver Education Details Patient Name: Date of Service: [name] 9/8/2023andnbsp2:45 PM Medical Record Number: [id number] Patient Account Number: [id number] Date of Birth/Gender: Treating RN: October 25, 1980 (41 y.o. [name] Primary Care Physician: [name] Other Clinician: Referring Physician: Treating Physician/Extender: [name] in Treatment: 280 Education Assessment Education Provided To: Patient Education Topics Provided Pressure: Methods: Explain/Verbal Responses: Reinforcements needed, State content correctly Wound/Skin Impairment: Methods: Explain/Verbal Responses: Reinforcements needed, State content correctly Electronic Signature(s) Signed: 11/08/2021 6:08:36 PM By: Baruch Gouty RN, BSN Signed: 11/08/2021 6:08:36 PM By: Baruch Gouty RN, BSN Entered By: Baruch Gouty on 11/08/2021 15:23:14 -------------------------------------------------------------------------------- Wound Assessment Details Patient Name: Date of Service: Jamie Boyden A. 11/08/2021 2:45 PM Medical Record Number: [id number] Patient Account Number: [id number] Date of Birth/Sex: Treating RN: [birth year] (41 y.o. [name] Primary Care Lashonna Rieke: [name] Other Clinician: Referring Shaneil Yazdi: Treating Tangia Pinard/Extender: Trude Mcburney Weeks in Treatment: 280 Wound Status Wound Number: 1 Primary Etiology: Pressure Ulcer Wound Location: Medial Sacrum Wound Status: Open Wounding Event: Pressure Injury Comorbid History: Anemia,  Osteomyelitis Date Acquired: 05/15/2016 Weeks Of Treatment: 280 Clustered Wound: No Photos Wound Measurements Length: (cm) 2.9 Width: (cm) 2 Depth: (cm) 2.1 Area: (cm) 4.555 Volume: (cm) 9.566 % Reduction in Area: 86.2% % Reduction in Volume: 93.4% Epithelialization: None Tunneling: No Undermining: Yes Starting Position (o'clock): 1 Ending Position (o'clock): 4 Maximum Distance: (cm) 1.7 Wound Description Classification: Category/Stage IV Wound Margin: Well defined, not attached Exudate Amount: Medium Exudate Type: Serosanguineous Exudate Color: red, brown Foul Odor After Cleansing: No Slough/Fibrino No Wound Bed Granulation Amount: Large (67-100%) Exposed Structure Granulation Quality: Red Fascia Exposed: No Necrotic Amount: Small (1-33%) Fat Layer (Subcutaneous Tissue) Exposed: Yes Necrotic Quality: Adherent Slough Tendon Exposed: No Muscle Exposed: No Joint Exposed: No Bone Exposed: No Treatment Notes Wound #1 (Sacrum) Wound Laterality: Medial Cleanser Soap and Water Discharge Instruction: May shower and wash wound with dial antibacterial soap and water prior to dressing change. Wound Cleanser Discharge Instruction: Cleanse the wound with wound cleanser prior to applying a clean dressing using gauze sponges, not tissue or cotton balls. Peri-Wound Care Zinc Oxide Ointment 30g tube Discharge Instruction: Apply Zinc Oxide to periwound as needed for moisture Topical keystone antibiotic compound Discharge Instruction: place keystone in base of wound Primary Dressing Anasept Antimicrobial Wound Gel, 1.5 (oz) tube Discharge Instruction: moisten gauze with anasept and pack wound Secondary Dressing ABD Pad, 8x10 Discharge Instruction: Apply over primary dressing as directed. Secured With 55M Medipore H Soft Cloth Surgical T ape, 4 x 10 (in/yd) Discharge Instruction: Secure with tape as directed. Compression Wrap Compression Stockings Add-Ons Electronic  Signature(s) Signed: 11/08/2021 6:08:36 PM By: Baruch Gouty RN, BSN Entered By: Baruch Gouty on 11/08/2021 15:22:27 -------------------------------------------------------------------------------- Vitals Details Patient Name: Date of Service: Jamie Boyden A. 11/08/2021 2:45 PM Medical Record Number: [id number] Patient Account Number: [id number] Date of Birth/Sex: Treating RN: 29-May-1980 (41 y.o.  [name] Primary Care Angele Wiemann: [name] Other Clinician: Referring Saleem Coccia: Treating Teren Zurcher/Extender: Trude Mcburney Weeks in Treatment: 280 Vital Signs Time Taken: 15:09 Temperature (F): 98.3 Height (in): 65 Pulse (bpm): 89 Weight (lbs): 201 Respiratory Rate (breaths/min): 18 Body Mass Index (BMI): 33.4 Blood Pressure (mmHg): 150/82 Reference Range: 80 - 120 mg / dl Electronic Signature(s) Signed: 11/08/2021 6:08:36 PM By: Baruch Gouty RN, BSN Entered By: Baruch Gouty on 11/08/2021 15:10:14
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
## 2021-11-11 ENCOUNTER — Ambulatory Visit: Payer: Medicaid Other | Admitting: Physical Therapy

## 2021-11-13 ENCOUNTER — Telehealth (INDEPENDENT_AMBULATORY_CARE_PROVIDER_SITE_OTHER): Payer: Medicaid Other | Admitting: Family Medicine

## 2021-11-13 DIAGNOSIS — U071 COVID-19: Secondary | ICD-10-CM | POA: Diagnosis not present

## 2021-11-13 MED ORDER — BENZONATATE 100 MG PO CAPS: 100.0000 mg | ORAL_CAPSULE | Freq: Three times a day (TID) | ORAL | 0 refills | Status: DC | PRN

## 2021-11-13 MED ORDER — MOLNUPIRAVIR EUA 200MG CAPSULE: 4.0000 | ORAL_CAPSULE | Freq: Two times a day (BID) | ORAL | 0 refills | Status: AC
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
## 2021-11-14 ENCOUNTER — Ambulatory Visit: Payer: Self-pay | Admitting: Physical Therapy

## 2021-11-22 ENCOUNTER — Encounter (HOSPITAL_BASED_OUTPATIENT_CLINIC_OR_DEPARTMENT_OTHER): Payer: Medicaid Other | Admitting: General Surgery

## 2021-11-22 DIAGNOSIS — L89154 Pressure ulcer of sacral region, stage 4: Secondary | ICD-10-CM | POA: Diagnosis not present

## 2021-11-22 NOTE — Progress Notes (Signed)
[name], [name] ([id number]) Visit Report for 11/22/2021 Arrival Information Details Patient Name: Date of Service: [name], [name] 11/22/2021 2:45 PM Medical Record Number: [id number] Patient Account Number: [id number] Date of Birth/Sex: Treating RN: [birth year] (41 y.o. [name] Primary Care Aswad Wandrey: [name] Other Clinician: Referring Oaklyn Mans: Treating Nathanyl Andujo/Extender: [name] in Treatment: 55 Visit Information History Since Last Visit Added or deleted any medications: Yes Patient Arrived: Wheel Chair Any new allergies or adverse reactions: No Arrival Time: 15:19 Had a fall or experienced change in No Accompanied By: self activities of daily living that may affect Transfer Assistance: [name] Lift risk of falls: Patient Identification Verified: Yes Signs or symptoms of abuse/neglect since last visito No Secondary Verification Process Completed: Yes Hospitalized since last visit: No Patient Requires Transmission-Based Precautions: No Implantable device outside of the clinic excluding No Patient Has Alerts: No cellular tissue based products placed in the center since last visit: Has Dressing in Place as Prescribed: Yes Pain Present Now: Yes Electronic Signature(s) Signed: 11/22/2021 5:09:34 PM By: Baruch Gouty RN, BSN Entered By: Baruch Gouty on 11/22/2021 15:22:16 -------------------------------------------------------------------------------- Encounter Discharge Information Details Patient Name: Date of Service: Jamie Boyden A. 11/22/2021 2:45 PM Medical Record Number: [id number] Patient Account Number: [id number] Date of Birth/Sex: Treating RN: 1981/01/21 (41 y.o. [name] Primary Care Leandre Wien: [name] Other Clinician: Referring Shakala Marlatt: Treating Mychaela Lennartz/Extender: [name] in Treatment: 282 Encounter Discharge Information Items Post Procedure  Vitals Discharge Condition: Stable Temperature (F): 97.7 Ambulatory Status: Wheelchair Pulse (bpm): 99 Discharge Destination: Home Respiratory Rate (breaths/min): 18 Transportation: Private Auto Blood Pressure (mmHg): 143/97 Accompanied By: self Schedule Follow-up Appointment: Yes Clinical Summary of Care: Patient Declined Notes transportation service Electronic Signature(s) Signed: 11/22/2021 5:09:34 PM By: Baruch Gouty RN, BSN Entered By: Baruch Gouty on 11/22/2021 16:07:30 -------------------------------------------------------------------------------- Lower Extremity Assessment Details Patient Name: Date of Service: [name], [name] 11/22/2021 2:45 PM Medical Record Number: [id number] Patient Account Number: [id number] Date of Birth/Sex: Treating RN: [birth year] (41 y.o. [name] Primary Care Amrit Cress: [name] Other Clinician: Referring Hanifa Antonetti: Treating Palin Tristan/Extender: Trude Mcburney Weeks in Treatment: 282 Electronic Signature(s) Signed: 11/22/2021 5:09:34 PM By: Baruch Gouty RN, BSN Entered By: Baruch Gouty on 11/22/2021 15:23:34 -------------------------------------------------------------------------------- Multi Wound Chart Details Patient Name: Date of Service: Jamie Boyden A. 11/22/2021 2:45 PM Medical Record Number: [id number] Patient Account Number: [id number] Date of Birth/Sex: Treating RN: 14-May-1980 (41 y.o. F) Primary Care Lyndal Reggio: [name] Other Clinician: Referring Dorethea Strubel: Treating Natasia Sanko/Extender: [name] in Treatment: 282 Vital Signs Height(in): 65 Pulse(bpm): 99 Weight(lbs): 201 Blood Pressure(mmHg): 143/97 Body Mass Index(BMI): 33.4 Temperature(F): 97.7 Respiratory Rate(breaths/min): 18 Photos: [N/A:N/A] Medial Sacrum N/A N/A Wound Location: Pressure Injury N/A N/A Wounding Event: Pressure Ulcer N/A N/A Primary Etiology: Anemia,  Osteomyelitis N/A N/A Comorbid History: 05/15/2016 N/A N/A Date Acquired: 282 N/A N/A Weeks of Treatment: Open N/A N/A Wound Status: No N/A N/A Wound Recurrence: 3x2x2.4 N/A N/A Measurements L x W x D (cm) 4.712 N/A N/A A (cm) : rea 11.31 N/A N/A Volume (cm) : 85.70% N/A N/A % Reduction in A rea: 92.20% N/A N/A % Reduction in Volume: 12 Starting Position 1 (o'clock): 6 Ending Position 1 (o'clock): 1.3 Maximum Distance 1 (cm): Yes N/A N/A Undermining: Category/Stage IV N/A N/A Classification: Medium N/A N/A Exudate A mount: Serosanguineous N/A N/A Exudate Type: red, brown N/A N/A Exudate Color: Distinct, outline attached N/A N/A Wound Margin: Large (67-100%) N/A N/A Granulation  Amount: Red N/A N/A Granulation Quality: Small (1-33%) N/A N/A Necrotic Amount: Fat Layer (Subcutaneous Tissue): Yes N/A N/A Exposed Structures: Fascia: No Tendon: No Muscle: No Joint: No Bone: No None N/A N/A Epithelialization: Debridement - Selective/Open Wound N/A N/A Debridement: Pre-procedure Verification/Time Out 15:40 N/A N/A Taken: Lidocaine 4% Topical Solution N/A N/A Pain Control: Skin/Epidermis N/A N/A Level: 6 N/A N/A Debridement A (sq cm): rea Curette N/A N/A Instrument: Minimum N/A N/A Bleeding: Pressure N/A N/A Hemostasis A chieved: 0 N/A N/A Procedural Pain: 0 N/A N/A Post Procedural Pain: Procedure was tolerated well N/A N/A Debridement Treatment Response: 3x2x2.4 N/A N/A Post Debridement Measurements L x W x D (cm) 11.31 N/A N/A Post Debridement Volume: (cm) Category/Stage IV N/A N/A Post Debridement Stage: Debridement N/A N/A Procedures Performed: Treatment Notes Electronic Signature(s) Signed: 11/22/2021 4:05:20 PM By: Fredirick Maudlin MD FACS Entered By: Fredirick Maudlin on 11/22/2021 16:05:20 -------------------------------------------------------------------------------- Multi-Disciplinary Care Plan Details Patient Name: Date of  Service: Jamie Boyden A. 11/22/2021 2:45 PM Medical Record Number: [id number] Patient Account Number: [id number] Date of Birth/Sex: Treating RN: Jan 02, 1981 (41 y.o. [name] Primary Care Jahlani Lorentz: [name] Other Clinician: Referring Jessup Ogas: Treating Dorathea Faerber/Extender: [name] in Treatment: [place] reviewed with physician Active Inactive Pressure Nursing Diagnoses: Knowledge deficit related to management of pressures ulcers Potential for impaired tissue integrity related to pressure, friction, moisture, and shear Goals: Patient will remain free from development of additional pressure ulcers Date Initiated: 06/27/2016 Date Inactivated: 01/17/2019 Target Resolution Date: 12/24/2018 Goal Status: Met Patient/caregiver will verbalize risk factors for pressure ulcer development Date Initiated: 06/27/2016 Date Inactivated: 07/08/2017 Target Resolution Date: 07/08/2017 Goal Status: Met Patient/caregiver will verbalize understanding of pressure ulcer management Date Initiated: 06/27/2016 Target Resolution Date: 01/01/2022 Goal Status: Active Interventions: Assess: immobility, friction, shearing, incontinence upon admission and as needed Assess offloading mechanisms upon admission and as needed Assess potential for pressure ulcer upon admission and as needed Provide education on pressure ulcers Treatment Activities: Patient referred for pressure reduction/relief devices : 06/27/2016 Pressure reduction/relief device ordered : 06/27/2016 Notes: Wound/Skin Impairment Nursing Diagnoses: Impaired tissue integrity Knowledge deficit related to smoking impact on wound healing Knowledge deficit related to ulceration/compromised skin integrity Goals: Patient will demonstrate a reduced rate of smoking or cessation of smoking Date Initiated: 06/27/2016 Date Inactivated: 01/26/2017 Target Resolution Date: 01/08/2017 Goal  Status: Unmet Unmet Reason: pt continues to smoke Patient/caregiver will verbalize understanding of skin care regimen Date Initiated: 06/27/2016 Target Resolution Date: 01/01/2022 Goal Status: Active Ulcer/skin breakdown will have a volume reduction of 50% by week 8 Date Initiated: 06/27/2016 Date Inactivated: 12/11/2016 Target Resolution Date: 07/25/2016 Goal Status: Met Interventions: Assess patient/caregiver ability to obtain necessary supplies Assess patient/caregiver ability to perform ulcer/skin care regimen upon admission and as needed Assess ulceration(s) every visit Provide education on smoking Provide education on ulcer and skin care Treatment Activities: Skin care regimen initiated : 06/27/2016 Topical wound management initiated : 06/27/2016 Notes: Electronic Signature(s) Signed: 11/22/2021 5:09:34 PM By: Baruch Gouty RN, BSN Entered By: Baruch Gouty on 11/22/2021 15:32:04 -------------------------------------------------------------------------------- Pain Assessment Details Patient Name: Date of Service: Jamie Boyden A. 11/22/2021 2:45 PM Medical Record Number: [id number] Patient Account Number: [id number] Date of Birth/Sex: Treating RN: 05/28/1980 (41 y.o. [name] Primary Care Bee Hammerschmidt: [name] Other Clinician: Referring Angeles Zehner: Treating Kelvyn Schunk/Extender: [name] in Treatment: 282 Active Problems Location of Pain Severity and Description of Pain Patient Has Paino Yes Site Locations Pain Location: Pain Location: Generalized Pain  With Dressing Change: No Duration of the Pain. Constant / Intermittento Intermittent Rate the pain. Current Pain Level: 7 Character of Pain Describe the Pain: Aching Pain Management and Medication Current Pain Management: Medication: Yes Is the Current Pain Management Adequate: Adequate How does your wound impact your activities of daily livingo Sleep: No Bathing:  No Appetite: No Relationship With Others: No Bladder Continence: No Emotions: No Bowel Continence: No Work: No Toileting: No Drive: No Dressing: No Hobbies: No Notes reports pain in hip today Electronic Signature(s) Signed: 11/22/2021 5:09:34 PM By: Baruch Gouty RN, BSN Entered By: Baruch Gouty on 11/22/2021 15:23:28 -------------------------------------------------------------------------------- Patient/Caregiver Education Details Patient Name: Date of Service: [name] 9/22/2023andnbsp2:45 PM Medical Record Number: [id number] Patient Account Number: [id number] Date of Birth/Gender: Treating RN: [birth year] (41 y.o. [name] Primary Care Physician: [name] Other Clinician: Referring Physician: Treating Physician/Extender: [name] in Treatment: 282 Education Assessment Education Provided To: Patient Education Topics Provided Pressure: Methods: Explain/Verbal Responses: Reinforcements needed, State content correctly Wound/Skin Impairment: Methods: Explain/Verbal Responses: Reinforcements needed, State content correctly Electronic Signature(s) Signed: 11/22/2021 5:09:34 PM By: Baruch Gouty RN, BSN Entered By: Baruch Gouty on 11/22/2021 15:32:28 -------------------------------------------------------------------------------- Wound Assessment Details Patient Name: Date of Service: Jamie Boyden A. 11/22/2021 2:45 PM Medical Record Number: [id number] Patient Account Number: [id number] Date of Birth/Sex: Treating RN: Mar 19, 1980 (41 y.o. F) Primary Care Meriah Shands: [name] Other Clinician: Referring Terri Malerba: Treating Jacci Ruberg/Extender: Trude Mcburney Weeks in Treatment: 282 Wound Status Wound Number: 1 Primary Etiology: Pressure Ulcer Wound Location: Medial Sacrum Wound Status: Open Wounding Event: Pressure Injury Comorbid History: Anemia, Osteomyelitis Date Acquired:  05/15/2016 Weeks Of Treatment: 282 Clustered Wound: No Photos Wound Measurements Length: (cm) 3 Width: (cm) 2 Depth: (cm) 2.4 Area: (cm) 4.712 Volume: (cm) 11.31 % Reduction in Area: 85.7% % Reduction in Volume: 92.2% Epithelialization: None Tunneling: No Undermining: Yes Starting Position (o'clock): 12 Ending Position (o'clock): 6 Maximum Distance: (cm) 1.3 Wound Description Classification: Category/Stage IV Wound Margin: Distinct, outline attached Exudate Amount: Medium Exudate Type: Serosanguineous Exudate Color: red, brown Foul Odor After Cleansing: No Slough/Fibrino No Wound Bed Granulation Amount: Large (67-100%) Exposed Structure Granulation Quality: Red Fascia Exposed: No Necrotic Amount: Small (1-33%) Fat Layer (Subcutaneous Tissue) Exposed: Yes Necrotic Quality: Adherent Slough Tendon Exposed: No Muscle Exposed: No Joint Exposed: No Bone Exposed: No Treatment Notes Wound #1 (Sacrum) Wound Laterality: Medial Cleanser Soap and Water Discharge Instruction: May shower and wash wound with dial antibacterial soap and water prior to dressing change. Wound Cleanser Discharge Instruction: Cleanse the wound with wound cleanser prior to applying a clean dressing using gauze sponges, not tissue or cotton balls. Peri-Wound Care Zinc Oxide Ointment 30g tube Discharge Instruction: Apply Zinc Oxide to periwound as needed for moisture Topical keystone antibiotic compound Discharge Instruction: place keystone in base of wound Primary Dressing Anasept Antimicrobial Wound Gel, 1.5 (oz) tube Discharge Instruction: moisten gauze with anasept and pack wound Secondary Dressing ABD Pad, 8x10 Discharge Instruction: Apply over primary dressing as directed. Secured With 3M Medipore H Soft Cloth Surgical T ape, 4 x 10 (in/yd) Discharge Instruction: Secure with tape as directed. Compression Wrap Compression Stockings Add-Ons Electronic Signature(s) Signed: 11/22/2021  5:09:34 PM By: Baruch Gouty RN, BSN Entered By: Baruch Gouty on 11/22/2021 15:33:07 -------------------------------------------------------------------------------- Vitals Details Patient Name: Date of Service: Jamie Boyden A. 11/22/2021 2:45 PM Medical Record Number: [id number] Patient Account Number: [id number] Date of Birth/Sex: Treating RN: 1981/01/05 (41 y.o. [name] Primary Care Kollins Fenter:  [name] Other Clinician: Referring Windsor Zirkelbach: Treating Nyoka Alcoser/Extender: Trude Mcburney Weeks in Treatment: 282 Vital Signs Time Taken: 15:22 Temperature (F): 97.7 Height (in): 65 Pulse (bpm): 99 Weight (lbs): 201 Respiratory Rate (breaths/min): 18 Body Mass Index (BMI): 33.4 Blood Pressure (mmHg): 143/97 Reference Range: 80 - 120 mg / dl Electronic Signature(s) Signed: 11/22/2021 5:09:34 PM By: Baruch Gouty RN, BSN Entered By: Baruch Gouty on 11/22/2021 15:22:44
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
## 2021-11-26 ENCOUNTER — Ambulatory Visit (INDEPENDENT_AMBULATORY_CARE_PROVIDER_SITE_OTHER): Payer: Medicaid Other | Admitting: Family Medicine

## 2021-11-26 ENCOUNTER — Encounter: Payer: Self-pay | Admitting: Family Medicine

## 2021-11-26 ENCOUNTER — Telehealth: Payer: Self-pay | Admitting: Family Medicine

## 2021-11-26 VITALS — BP 121/88 | HR 93 | Temp 98.4°F | Resp 22 | Ht 66.0 in

## 2021-11-26 DIAGNOSIS — E039 Hypothyroidism, unspecified: Secondary | ICD-10-CM

## 2021-11-26 DIAGNOSIS — B9689 Other specified bacterial agents as the cause of diseases classified elsewhere: Secondary | ICD-10-CM

## 2021-11-26 DIAGNOSIS — E038 Other specified hypothyroidism: Secondary | ICD-10-CM

## 2021-11-26 DIAGNOSIS — K219 Gastro-esophageal reflux disease without esophagitis: Secondary | ICD-10-CM

## 2021-11-26 DIAGNOSIS — Z79899 Other long term (current) drug therapy: Secondary | ICD-10-CM

## 2021-11-26 DIAGNOSIS — Z23 Encounter for immunization: Secondary | ICD-10-CM

## 2021-11-26 DIAGNOSIS — S34101D Unspecified injury to L1 level of lumbar spinal cord, subsequent encounter: Secondary | ICD-10-CM

## 2021-11-26 DIAGNOSIS — G8929 Other chronic pain: Secondary | ICD-10-CM

## 2021-11-26 DIAGNOSIS — E782 Mixed hyperlipidemia: Secondary | ICD-10-CM

## 2021-11-26 DIAGNOSIS — T8389XD Other specified complication of genitourinary prosthetic devices, implants and grafts, subsequent encounter: Secondary | ICD-10-CM

## 2021-11-26 DIAGNOSIS — F339 Major depressive disorder, recurrent, unspecified: Secondary | ICD-10-CM

## 2021-11-26 DIAGNOSIS — Z9359 Other cystostomy status: Secondary | ICD-10-CM

## 2021-11-26 DIAGNOSIS — M25551 Pain in right hip: Secondary | ICD-10-CM | POA: Diagnosis not present

## 2021-11-26 DIAGNOSIS — N319 Neuromuscular dysfunction of bladder, unspecified: Secondary | ICD-10-CM

## 2021-11-26 DIAGNOSIS — L89154 Pressure ulcer of sacral region, stage 4: Secondary | ICD-10-CM

## 2021-11-26 DIAGNOSIS — F411 Generalized anxiety disorder: Secondary | ICD-10-CM | POA: Diagnosis not present

## 2021-11-26 DIAGNOSIS — I10 Essential (primary) hypertension: Secondary | ICD-10-CM

## 2021-11-26 DIAGNOSIS — T1490XS Injury, unspecified, sequela: Secondary | ICD-10-CM

## 2021-11-26 DIAGNOSIS — F3342 Major depressive disorder, recurrent, in full remission: Secondary | ICD-10-CM

## 2021-11-26 DIAGNOSIS — N368 Other specified disorders of urethra: Secondary | ICD-10-CM

## 2021-11-26 DIAGNOSIS — J302 Other seasonal allergic rhinitis: Secondary | ICD-10-CM

## 2021-11-26 DIAGNOSIS — F410 Panic disorder [episodic paroxysmal anxiety] without agoraphobia: Secondary | ICD-10-CM

## 2021-11-26 MED ORDER — OXYCODONE HCL 10 MG PO TABS: 10.0000 mg | ORAL_TABLET | Freq: Three times a day (TID) | ORAL | 0 refills | Status: DC

## 2021-11-26 MED ORDER — HYDROXYZINE HCL 10 MG PO TABS: 10.0000 mg | ORAL_TABLET | Freq: Three times a day (TID) | ORAL | 2 refills | Status: DC | PRN

## 2021-11-26 MED ORDER — GABAPENTIN 600 MG PO TABS: 600.0000 mg | ORAL_TABLET | Freq: Four times a day (QID) | ORAL | 1 refills | Status: DC

## 2021-11-26 MED ORDER — FERROUS SULFATE 325 (65 FE) MG PO TABS: 325.0000 mg | ORAL_TABLET | Freq: Every day | ORAL | 1 refills | Status: DC

## 2021-11-26 MED ORDER — LEVOTHYROXINE SODIUM 88 MCG PO TABS: 88.0000 ug | ORAL_TABLET | Freq: Every day | ORAL | 1 refills | Status: DC

## 2021-11-26 MED ORDER — CYCLOBENZAPRINE HCL 5 MG PO TABS: 5.0000 mg | ORAL_TABLET | Freq: Three times a day (TID) | ORAL | 1 refills | Status: DC

## 2021-11-26 MED ORDER — BUPROPION HCL ER (SR) 150 MG PO TB12: 150.0000 mg | ORAL_TABLET | Freq: Two times a day (BID) | ORAL | 1 refills | Status: DC

## 2021-11-26 MED ORDER — OXYBUTYNIN CHLORIDE ER 5 MG PO TB24: 5.0000 mg | ORAL_TABLET | Freq: Every day | ORAL | 1 refills | Status: DC

## 2021-11-26 MED ORDER — ASCORBIC ACID 500 MG PO TABS: 500.0000 mg | ORAL_TABLET | Freq: Every day | ORAL | 1 refills | Status: DC

## 2021-11-26 MED ORDER — OMEPRAZOLE 20 MG PO CPDR: 20.0000 mg | DELAYED_RELEASE_CAPSULE | Freq: Every day | ORAL | 1 refills | Status: DC

## 2021-11-26 MED ORDER — COMBIVENT RESPIMAT 20-100 MCG/ACT IN AERS: 1.0000 | INHALATION_SPRAY | Freq: Four times a day (QID) | RESPIRATORY_TRACT | 5 refills | Status: DC | PRN

## 2021-11-26 MED ORDER — BUSPIRONE HCL 15 MG PO TABS: 15.0000 mg | ORAL_TABLET | Freq: Two times a day (BID) | ORAL | 1 refills | Status: DC

## 2021-11-26 MED ORDER — KETOROLAC TROMETHAMINE 60 MG/2ML IM SOLN
60.0000 mg | Freq: Once | INTRAMUSCULAR | Status: AC
  Administered 2021-11-26: 60 mg via INTRAMUSCULAR

## 2021-11-26 MED ORDER — FLUTICASONE PROPIONATE 50 MCG/ACT NA SUSP: 2.0000 | Freq: Every day | NASAL | 2 refills | Status: DC

## 2021-11-26 MED ORDER — CITALOPRAM HYDROBROMIDE 40 MG PO TABS: 40.0000 mg | ORAL_TABLET | Freq: Every day | ORAL | 1 refills | Status: DC

## 2021-11-26 MED ORDER — LEVOCETIRIZINE DIHYDROCHLORIDE 5 MG PO TABS: 5.0000 mg | ORAL_TABLET | Freq: Every evening | ORAL | 1 refills | Status: DC

## 2021-11-26 MED ORDER — FLUCONAZOLE 150 MG PO TABS: 150.0000 mg | ORAL_TABLET | Freq: Once | ORAL | 0 refills | Status: AC

## 2021-11-26 NOTE — Telephone Encounter (Signed)
[name] is going to call patient
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
## 2021-11-29 LAB — TOXASSURE SELECT 13 (MW), URINE

## 2021-12-04 ENCOUNTER — Ambulatory Visit: Payer: Medicaid Other | Admitting: Family Medicine

## 2021-12-08 IMAGING — DX DG CHEST 1V PORT
1 series · 1 of 1 positions shown · non-contrast
Comparison: Chest radiograph 01/30/2019

CLINICAL DATA: Productive cough x 2 days.

EXAM:
PORTABLE CHEST 1 VIEW

[chest ap]
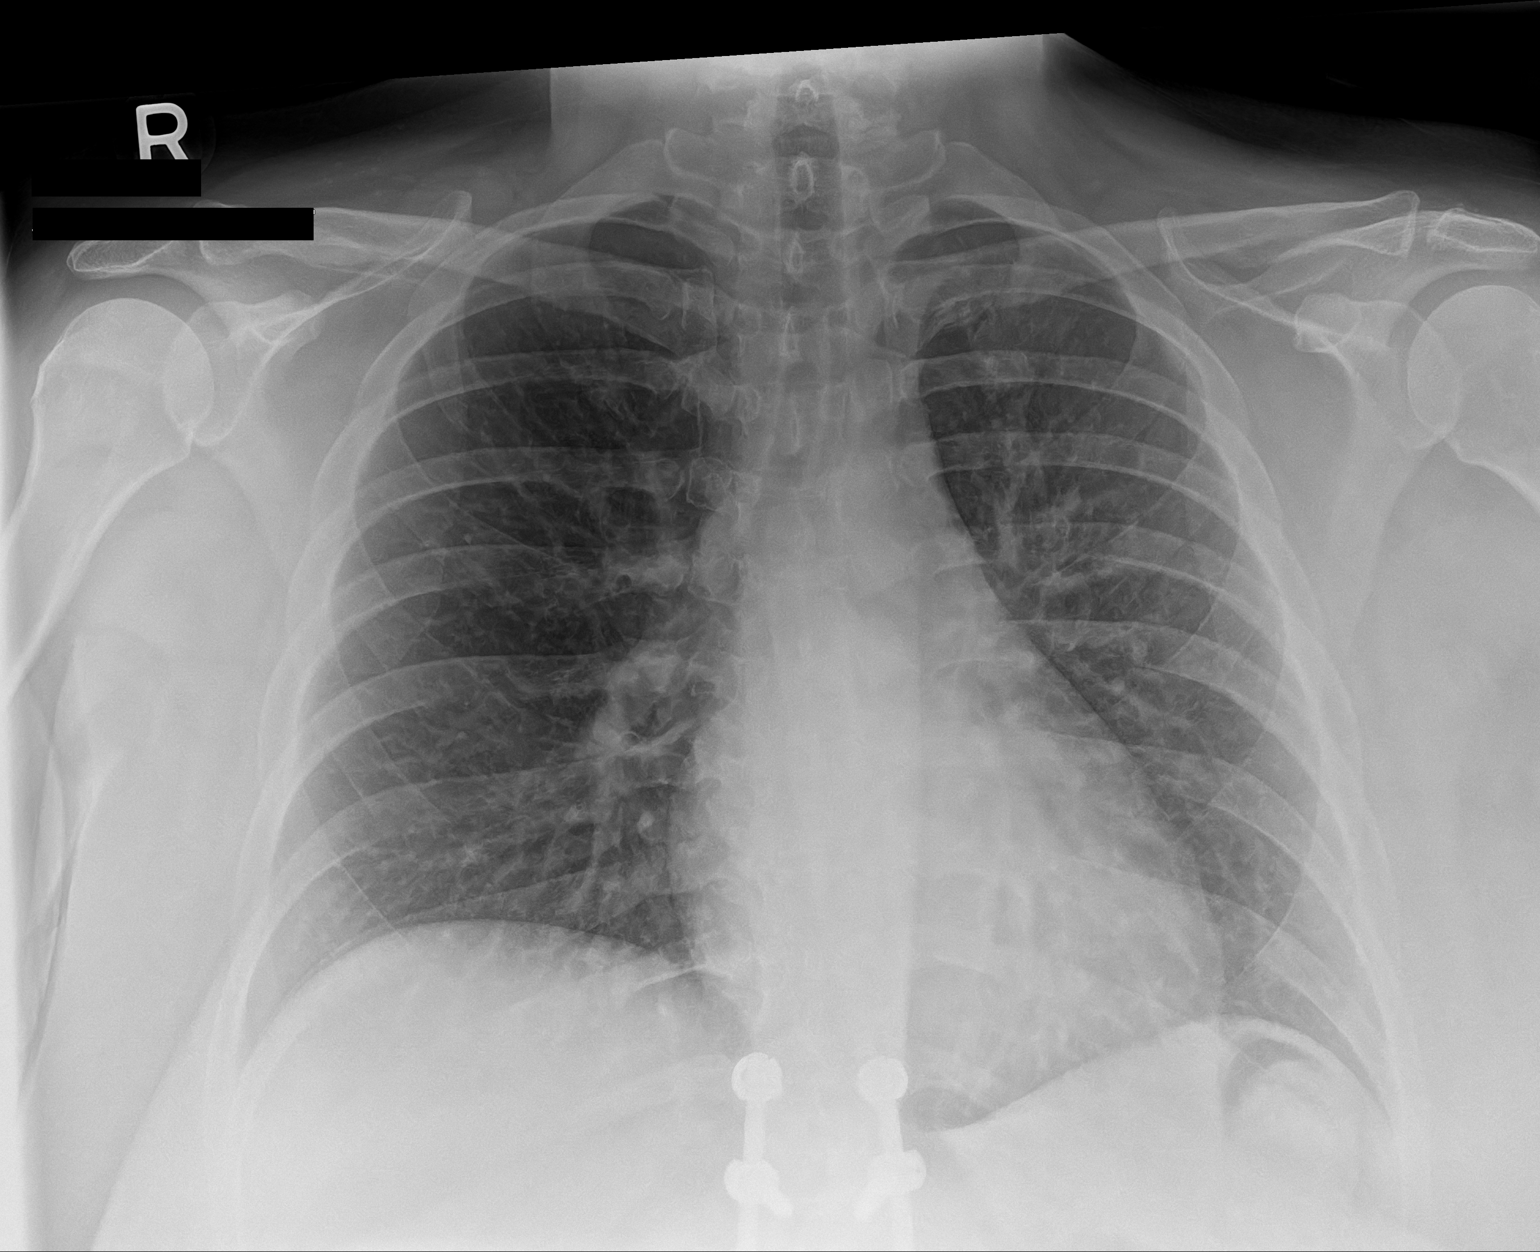

[1 of 1 positions shown; findings below may reference images not displayed]

FINDINGS: Interval removal of a left upper extremity PICC. The heart size and
mediastinal contours are within normal limits. The lungs are clear.
No pneumothorax or pleural effusion. No acute finding in the
visualized skeleton.
IMPRESSION: No acute cardiopulmonary process.

## 2021-12-17 ENCOUNTER — Other Ambulatory Visit: Payer: Self-pay | Admitting: Family Medicine

## 2021-12-17 DIAGNOSIS — B372 Candidiasis of skin and nail: Secondary | ICD-10-CM

## 2021-12-18 ENCOUNTER — Emergency Department (HOSPITAL_COMMUNITY): Payer: Medicaid Other

## 2021-12-18 ENCOUNTER — Telehealth: Payer: Self-pay

## 2021-12-18 ENCOUNTER — Encounter (HOSPITAL_COMMUNITY): Payer: Self-pay | Admitting: Emergency Medicine

## 2021-12-18 ENCOUNTER — Other Ambulatory Visit: Payer: Self-pay

## 2021-12-18 ENCOUNTER — Emergency Department (HOSPITAL_COMMUNITY)
Admission: EM | Admit: 2021-12-18 | Discharge: 2021-12-19 | Disposition: A | Discharge status: Other Acute Inpatient Hospital | Payer: Medicaid Other | Attending: Emergency Medicine | Admitting: Emergency Medicine

## 2021-12-18 DIAGNOSIS — N179 Acute kidney failure, unspecified: Secondary | ICD-10-CM

## 2021-12-18 DIAGNOSIS — N9989 Other postprocedural complications and disorders of genitourinary system: Secondary | ICD-10-CM | POA: Insufficient documentation

## 2021-12-18 DIAGNOSIS — Z79899 Other long term (current) drug therapy: Secondary | ICD-10-CM | POA: Insufficient documentation

## 2021-12-18 DIAGNOSIS — Z87891 Personal history of nicotine dependence: Secondary | ICD-10-CM | POA: Diagnosis not present

## 2021-12-18 DIAGNOSIS — I10 Essential (primary) hypertension: Secondary | ICD-10-CM | POA: Diagnosis not present

## 2021-12-18 DIAGNOSIS — N390 Urinary tract infection, site not specified: Secondary | ICD-10-CM

## 2021-12-18 DIAGNOSIS — E039 Hypothyroidism, unspecified: Secondary | ICD-10-CM | POA: Insufficient documentation

## 2021-12-18 DIAGNOSIS — J189 Pneumonia, unspecified organism: Secondary | ICD-10-CM

## 2021-12-18 LAB — CBC WITH DIFFERENTIAL/PLATELET
Abs Immature Granulocytes: 0.13 10*3/uL — ABNORMAL HIGH (ref 0.00–0.07)
Basophils Absolute: 0.1 10*3/uL (ref 0.0–0.1)
Basophils Relative: 0 %
Eosinophils Absolute: 0.7 10*3/uL — ABNORMAL HIGH (ref 0.0–0.5)
Eosinophils Relative: 3 %
HCT: 30.3 % — ABNORMAL LOW (ref 36.0–46.0)
Hemoglobin: 9.3 g/dL — ABNORMAL LOW (ref 12.0–15.0)
Immature Granulocytes: 1 %
Lymphocytes Relative: 13 %
Lymphs Abs: 2.4 10*3/uL (ref 0.7–4.0)
MCH: 26.8 pg (ref 26.0–34.0)
MCHC: 30.7 g/dL (ref 30.0–36.0)
MCV: 87.3 fL (ref 80.0–100.0)
Monocytes Absolute: 1.3 10*3/uL — ABNORMAL HIGH (ref 0.1–1.0)
Monocytes Relative: 7 %
Neutro Abs: 14.9 10*3/uL — ABNORMAL HIGH (ref 1.7–7.7)
Neutrophils Relative %: 76 %
Platelets: 620 10*3/uL — ABNORMAL HIGH (ref 150–400)
RBC: 3.47 MIL/uL — ABNORMAL LOW (ref 3.87–5.11)
RDW: 17.5 % — ABNORMAL HIGH (ref 11.5–15.5)
WBC: 19.5 10*3/uL — ABNORMAL HIGH (ref 4.0–10.5)
nRBC: 0 % (ref 0.0–0.2)

## 2021-12-18 LAB — COMPREHENSIVE METABOLIC PANEL
ALT: 18 U/L (ref 0–44)
AST: 35 U/L (ref 15–41)
Albumin: 2.9 g/dL — ABNORMAL LOW (ref 3.5–5.0)
Alkaline Phosphatase: 84 U/L (ref 38–126)
Anion gap: 10 (ref 5–15)
BUN: 33 mg/dL — ABNORMAL HIGH (ref 6–20)
CO2: 23 mmol/L (ref 22–32)
Calcium: 7.8 mg/dL — ABNORMAL LOW (ref 8.9–10.3)
Chloride: 103 mmol/L (ref 98–111)
Creatinine, Ser: 1.94 mg/dL — ABNORMAL HIGH (ref 0.44–1.00)
GFR, Estimated: 33 mL/min — ABNORMAL LOW (ref >60)
Glucose, Bld: 106 mg/dL — ABNORMAL HIGH (ref 70–99)
Potassium: 4.2 mmol/L (ref 3.5–5.1)
Sodium: 136 mmol/L (ref 135–145)
Total Bilirubin: 0.4 mg/dL (ref 0.3–1.2)
Total Protein: 6.8 g/dL (ref 6.5–8.1)

## 2021-12-18 MED ORDER — IOHEXOL 300 MG/ML  SOLN
75.0000 mL | Freq: Once | INTRAMUSCULAR | Status: AC | PRN
  Administered 2021-12-19: 75 mL via INTRAVENOUS
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
## 2021-12-18 NOTE — ED Triage Notes (Signed)
Patient BIB [facility] c/o not feeling well since urostomy placement on 10/10 at [facility].  Patient reports that yesterday while transferring into the motorized chair, she felt the "bag pull" and it hasn't felt right since.  98.6 142/84 98% RA

## 2021-12-18 NOTE — Telephone Encounter (Signed)
Transition Care Management Follow-up Telephone Call Date of discharge and from where: 12/17/2021 [facility] How have you been since you were released from the hospital? Per mother patient is sleeping all the time and her stoma is leaking "gushing" from the center - not emptying into bag through the tubes as it should Per mother patient told hospital she "did not feel right and felt like she was going to die" before being discharged  Any questions or concerns? Yes - please see note above Mother also has questions in regards to abnormal lab results - WBC was elevated, hemoglobin was low, and creatine was abnormal - she states that she asked for labs to be recheck before leaving and the hospital refused  Mother states that when transporting the patient the sling under patient to use with the lift was removed from under her and in the process her bag was twisted and part of it or the tubing was poking into her abd.   Items Reviewed: Did the pt receive and understand the discharge instructions provided? No  Medications obtained and verified? Yes  Patient's mother states that doxycyline was sent in and pain medication but pain medication was not picked up  Other?  Please see above Any new allergies since your discharge? No  Dietary orders reviewed? Not sure Do you have support at home? Yes   Home Care and Equipment/Supplies: Were home health services ordered? Home health was to be order for 2 times a week but mother states she called to set up and company did not have order If so, what is the name of the agency? [facility]  Has the agency set up a time to come to the patient's home? no Were any new equipment or medical supplies ordered?  No What is the name of the medical supply agency? na Were you able to get the supplies/equipment? not applicable Do you have any questions related to the use of the equipment or supplies? No  Functional Questionnaire: (I = Independent and D = Dependent) ADLs:  D  Bathing/Dressing- D  Meal Prep- D  Eating- D  Maintaining continence- D  Transferring/Ambulation- D  Managing Meds- D  Follow up appointments reviewed:  PCP Hospital f/u appt confirmed?  Patient will follow up with PCP as needed - at this moment patient is going back to ED   [facility] f/u appt confirmed? Yes  Scheduled to see Urologist on 12/25/2021 Are transportation arrangements needed?  Patient uses [facility] - mother will contact the office if she need further help setting up  If their condition worsens, is the pt aware to call PCP or go to the Emergency Dept.? Yes Was the patient provided with contact information for the PCP's office or ED? Yes Was to pt encouraged to call back with questions or concerns? Yes Please see notes above - spoke with Dr. Lajuana Ripple - patient's mother advised to seek emergency care today and informed that it would be best to receive care from [facility] as they are the ones that done procedure. However with transpiration is an issue for patient advised to call 911 and go tp local hospital and if need they will transfer her to [facility]. Mother expressed understanding

## 2021-12-19 LAB — URINALYSIS, ROUTINE W REFLEX MICROSCOPIC
Bilirubin Urine: NEGATIVE
Glucose, UA: NEGATIVE mg/dL
Ketones, ur: NEGATIVE mg/dL
Nitrite: NEGATIVE
Protein, ur: 100 mg/dL — AB
Specific Gravity, Urine: 1.006 (ref 1.005–1.030)
pH: 7 (ref 5.0–8.0)

## 2021-12-19 MED ORDER — SODIUM CHLORIDE 0.9 % IV SOLN
2.0000 g | Freq: Once | INTRAVENOUS | Status: AC
  Administered 2021-12-19: 2 g via INTRAVENOUS
  Filled 2021-12-19: qty 12.5

## 2021-12-19 MED ORDER — VANCOMYCIN HCL IN DEXTROSE 1-5 GM/200ML-% IV SOLN
1000.0000 mg | Freq: Once | INTRAVENOUS | Status: AC
  Administered 2021-12-19: 1000 mg via INTRAVENOUS
  Filled 2021-12-19: qty 200

## 2021-12-19 MED ORDER — ONDANSETRON HCL 4 MG/2ML IJ SOLN
4.0000 mg | Freq: Once | INTRAMUSCULAR | Status: AC
  Administered 2021-12-19: 4 mg via INTRAVENOUS
  Filled 2021-12-19: qty 2

## 2021-12-19 MED ORDER — HYDROMORPHONE HCL 1 MG/ML IJ SOLN
1.0000 mg | Freq: Once | INTRAMUSCULAR | Status: AC
  Administered 2021-12-19: 1 mg via INTRAVENOUS
  Filled 2021-12-19: qty 1

## 2021-12-19 MED ORDER — MORPHINE SULFATE (PF) 4 MG/ML IV SOLN
4.0000 mg | Freq: Once | INTRAVENOUS | Status: AC
  Administered 2021-12-19: 4 mg via INTRAVENOUS
  Filled 2021-12-19: qty 1
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
## 2021-12-24 ENCOUNTER — Telehealth: Payer: Self-pay

## 2021-12-24 LAB — CULTURE, BLOOD (ROUTINE X 2)
Culture: NO GROWTH
Culture: NO GROWTH
Special Requests: ADEQUATE
Special Requests: ADEQUATE

## 2021-12-24 NOTE — Telephone Encounter (Signed)
Transition Care Management Follow-up Telephone Call Date of discharge and from where: 12/23/21 [facility] was transferred from [facility] ED How have you been since you were released from the hospital? Patient is doing much better  Any questions or concerns? No  Items Reviewed: Did the pt receive and understand the discharge instructions provided? Yes  Medications obtained and verified? Yes  Other? Yes  Any new allergies since your discharge? No  Dietary orders reviewed? Yes Do you have support at home? Yes   Home Care and Equipment/Supplies: Were home health services ordered? not applicable If so, what is the name of the agency? na  Has the agency set up a time to come to the patient's home? not applicable Were any new equipment or medical supplies ordered?  No What is the name of the medical supply agency? na Were you able to get the supplies/equipment? no Do you have any questions related to the use of the equipment or supplies? No  Functional Questionnaire: (I = Independent and D = Dependent) ADLs: D  Bathing/Dressing- D  Meal Prep- D  Eating- D  Maintaining continence- D  Transferring/Ambulation- D  Managing Meds- D  Follow up appointments reviewed:  PCP Hospital f/u appt confirmed? YES  Scheduled to see Lajuana Ripple  on 01/13/22 @ 3pm.  Mother requested to change due to transportation. This was the next available for provider mother ok with the date as patient has follow ups with her specialist the next 2-3 weeks  [facility] f/u appt confirmed? Yes  Scheduled to see Urology 12/25/2021 and Neurology 01/01/2022 Are transportation arrange/ments needed?  Mother sets up with [facility]  If their condition worsens, is the pt aware to call PCP or go to the Emergency Dept.? Yes Was the patient provided with contact information for the PCP's office or ED? Yes Was to pt encouraged to call back with questions or concerns? Yes

## 2021-12-30 ENCOUNTER — Inpatient Hospital Stay: Payer: Medicaid Other

## 2022-01-13 ENCOUNTER — Encounter: Payer: Self-pay | Admitting: Family Medicine

## 2022-01-13 ENCOUNTER — Ambulatory Visit (INDEPENDENT_AMBULATORY_CARE_PROVIDER_SITE_OTHER): Payer: Medicaid Other | Admitting: Family Medicine

## 2022-01-13 VITALS — BP 114/72 | HR 97 | Temp 98.1°F | Ht 66.0 in

## 2022-01-13 DIAGNOSIS — T8389XD Other specified complication of genitourinary prosthetic devices, implants and grafts, subsequent encounter: Secondary | ICD-10-CM | POA: Diagnosis not present

## 2022-01-13 DIAGNOSIS — N368 Other specified disorders of urethra: Secondary | ICD-10-CM | POA: Diagnosis not present

## 2022-01-13 DIAGNOSIS — G8929 Other chronic pain: Secondary | ICD-10-CM | POA: Diagnosis not present

## 2022-01-13 DIAGNOSIS — Z79899 Other long term (current) drug therapy: Secondary | ICD-10-CM

## 2022-01-13 DIAGNOSIS — F411 Generalized anxiety disorder: Secondary | ICD-10-CM

## 2022-01-13 DIAGNOSIS — N319 Neuromuscular dysfunction of bladder, unspecified: Secondary | ICD-10-CM

## 2022-01-13 DIAGNOSIS — T1490XS Injury, unspecified, sequela: Secondary | ICD-10-CM

## 2022-01-13 DIAGNOSIS — E039 Hypothyroidism, unspecified: Secondary | ICD-10-CM

## 2022-01-13 DIAGNOSIS — Z978 Presence of other specified devices: Secondary | ICD-10-CM

## 2022-01-13 DIAGNOSIS — F3342 Major depressive disorder, recurrent, in full remission: Secondary | ICD-10-CM

## 2022-01-13 MED ORDER — OXYBUTYNIN CHLORIDE ER 5 MG PO TB24: 5.0000 mg | ORAL_TABLET | Freq: Every day | ORAL | 1 refills | Status: DC

## 2022-01-13 MED ORDER — GABAPENTIN 600 MG PO TABS: 600.0000 mg | ORAL_TABLET | Freq: Four times a day (QID) | ORAL | 1 refills | Status: DC

## 2022-01-13 MED ORDER — KETOROLAC TROMETHAMINE 60 MG/2ML IM SOLN
60.0000 mg | Freq: Once | INTRAMUSCULAR | Status: AC
  Administered 2022-01-13: 60 mg via INTRAMUSCULAR

## 2022-01-13 MED ORDER — DOCUSATE SODIUM 100 MG PO CAPS: 100.0000 mg | ORAL_CAPSULE | Freq: Two times a day (BID) | ORAL | 3 refills | Status: DC | PRN

## 2022-01-13 MED ORDER — OXYCODONE HCL 10 MG PO TABS: 10.0000 mg | ORAL_TABLET | Freq: Three times a day (TID) | ORAL | 0 refills | Status: DC

## 2022-01-13 MED ORDER — BUPROPION HCL ER (SR) 150 MG PO TB12: 150.0000 mg | ORAL_TABLET | Freq: Two times a day (BID) | ORAL | 1 refills | Status: DC

## 2022-01-13 MED ORDER — BUSPIRONE HCL 15 MG PO TABS: 15.0000 mg | ORAL_TABLET | Freq: Two times a day (BID) | ORAL | 1 refills | Status: DC

## 2022-01-13 MED ORDER — SULFAMETHOXAZOLE-TRIMETHOPRIM 400-80 MG PO TABS: 1.0000 | ORAL_TABLET | Freq: Every day | ORAL | 1 refills | Status: DC

## 2022-01-13 NOTE — Patient Instructions (Addendum)
Plan for full body exam next visit Lab results will be back in next 48 hours Please bring in urine sample ASAP Home health ordered
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
## 2022-01-14 ENCOUNTER — Other Ambulatory Visit: Payer: Medicaid Other

## 2022-01-14 ENCOUNTER — Encounter (HOSPITAL_BASED_OUTPATIENT_CLINIC_OR_DEPARTMENT_OTHER): Payer: Medicaid Other | Attending: General Surgery | Admitting: General Surgery

## 2022-01-14 DIAGNOSIS — L89154 Pressure ulcer of sacral region, stage 4: Secondary | ICD-10-CM | POA: Diagnosis not present

## 2022-01-14 DIAGNOSIS — G8222 Paraplegia, incomplete: Secondary | ICD-10-CM | POA: Insufficient documentation

## 2022-01-14 DIAGNOSIS — N319 Neuromuscular dysfunction of bladder, unspecified: Secondary | ICD-10-CM

## 2022-01-14 LAB — MICROSCOPIC EXAMINATION
RBC, Urine: NONE SEEN /hpf (ref 0–2)
Renal Epithel, UA: NONE SEEN /hpf

## 2022-01-14 LAB — URINALYSIS, ROUTINE W REFLEX MICROSCOPIC
Bilirubin, UA: NEGATIVE
Glucose, UA: NEGATIVE
Ketones, UA: NEGATIVE
Nitrite, UA: NEGATIVE
Protein,UA: NEGATIVE
RBC, UA: NEGATIVE
Specific Gravity, UA: 1.01 (ref 1.005–1.030)
Urobilinogen, Ur: 0.2 mg/dL (ref 0.2–1.0)
pH, UA: 7 (ref 5.0–7.5)
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
## 2022-01-15 NOTE — Progress Notes (Signed)
[name], [name] [name] ([id number]) [id number] Page 1 of 7 Visit Report for 01/14/2022 Arrival Information Details Patient Name: Date of Service: [name] 01/14/2022 1:15 PM Medical Record Number: [id number] Patient Account Number: [id number] Date of Birth/Sex: Treating RN: [birth year] (41 y.o. [name] Primary Care Preciosa Bundrick: Ronnie Doss Other Clinician: Referring Betsy Rosello: Treating Tyreak Reagle/Extender: Lizbeth Bark in Treatment: 289 Visit Information History Since Last Visit Added or deleted any medications: Yes Patient Arrived: Wheel Chair Any new allergies or adverse reactions: No Arrival Time: 13:48 Had [name] fall or experienced change in No Accompanied By: mother and caregiver activities of daily living that may affect Transfer Assistance: [name] Lift risk of falls: Patient Identification Verified: Yes Signs or symptoms of abuse/neglect since last visito No Secondary Verification Process Completed: Yes Hospitalized since last visit: Yes Patient Requires Transmission-Based Precautions: No Implantable device outside of the clinic excluding No Patient Has Alerts: No cellular tissue based products placed in the center since last visit: Has Dressing in Place as Prescribed: Yes Pain Present Now: Yes Electronic Signature(s) Signed: 01/14/2022 5:53:41 PM By: Baruch Gouty RN, BSN Entered By: Baruch Gouty on 01/14/2022 13:48:54 -------------------------------------------------------------------------------- Encounter Discharge Information Details Patient Name: Date of Service: [name] [name]. 01/14/2022 1:15 PM Medical Record Number: [id number] Patient Account Number: [id number] Date of Birth/Sex: Treating RN: March 17, 1980 (41 y.o. [name] Primary Care Roarke Marciano: Ronnie Doss Other Clinician: Referring Ayame Rena: Treating Jaxten Brosh/Extender: Lizbeth Bark in  Treatment: 289 Encounter Discharge Information Items Post Procedure Vitals Discharge Condition: Stable Temperature (F): 98.7 Ambulatory Status: Wheelchair Pulse (bpm): 94 Discharge Destination: Home Respiratory Rate (breaths/min): 18 Transportation: Other Blood Pressure (mmHg): 113/79 Accompanied By: mother, caregiver Schedule Follow-up Appointment: Yes Clinical Summary of Care: Patient Declined Notes transportation service Electronic Signature(s) Signed: 01/14/2022 5:53:41 PM By: Baruch Gouty RN, BSN Entered By: Baruch Gouty on 01/14/2022 17:04:25 [name] [name] ([id number]) [id number] Page 2 of 7 -------------------------------------------------------------------------------- Lower Extremity Assessment Details Patient Name: Date of Service: [name] 01/14/2022 1:15 PM Medical Record Number: [id number] Patient Account Number: [id number] Date of Birth/Sex: Treating RN: 1980/10/16 (41 y.o. [name] Primary Care Caelyn Route: Ronnie Doss Other Clinician: Referring Deleon Passe: Treating Kelby Lotspeich/Extender: Virgel Gess Weeks in Treatment: 289 Electronic Signature(s) Signed: 01/14/2022 5:53:41 PM By: Baruch Gouty RN, BSN Entered By: Baruch Gouty on 01/14/2022 13:50:19 -------------------------------------------------------------------------------- Multi Wound Chart Details Patient Name: Date of Service: [name] [name]. 01/14/2022 1:15 PM Medical Record Number: [id number] Patient Account Number: [id number] Date of Birth/Sex: Treating RN: 1980-05-18 (41 y.o. F) Primary Care Keniesha Adderly: Ronnie Doss Other Clinician: Referring Addison Freimuth: Treating Maleah Rabago/Extender: Virgel Gess Weeks in Treatment: 289 Vital Signs Height(in): 65 Pulse(bpm): 94 Weight(lbs): 201 Blood Pressure(mmHg): 113/79 Body Mass Index(BMI): 33.4 Temperature(F): 98.7 Respiratory  Rate(breaths/min): 18 [1:Photos:] [N/[name]:N/[name]] Medial Sacrum N/[name] N/[name] Wound Location: Pressure Injury N/[name] N/[name] Wounding Event: Pressure Ulcer N/[name] N/[name] Primary Etiology: Anemia, Osteomyelitis N/[name] N/[name] Comorbid History: 05/15/2016 N/[name] N/[name] Date Acquired: 289 N/[name] N/[name] Weeks of Treatment: Open N/[name] N/[name] Wound Status: No N/[name] N/[name] Wound Recurrence: 3x1.5x1.7 N/[name] N/[name] Measurements L x W x D (cm) 3.534 N/[name] N/[name] [name] (cm) : rea 6.008 N/[name] N/[name] Volume (cm) : 89.30% N/[name] N/[name] % Reduction in [name] rea: 95.80% N/[name] N/[name] % Reduction in Volume: 1 Starting Position 1 (o'clock): 4 Ending Position 1 (o'clock): 2 Maximum Distance 1 (cm): Yes N/[name] N/[name] Undermining: Category/Stage IV N/[name] N/[name] Classification: Medium N/[name] N/[name] Exudate [name] mount: Serosanguineous N/[name] N/[name] Exudate Type: red, brown  N/[name] N/[name] Exudate Color: Distinct, outline attached N/[name] N/[name] Wound Margin: [name] Jamie [name] ([id number]) [id number] Page 3 of 7 Large (67-100%) N/[name] N/[name] Granulation Amount: Red, Pink N/[name] N/[name] Granulation Quality: Small (1-33%) N/[name] N/[name] Necrotic Amount: Fat Layer (Subcutaneous Tissue): Yes N/[name] N/[name] Exposed Structures: Fascia: No Tendon: No Muscle: No Joint: No Bone: No None N/[name] N/[name] Epithelialization: Debridement - Selective/Open Wound N/[name] N/[name] Debridement: Pre-procedure Verification/Time Out 14:15 N/[name] N/[name] Taken: Skin/Epidermis N/[name] N/[name] Level: 4.5 N/[name] N/[name] Debridement [name] (sq cm): rea Curette N/[name] N/[name] Instrument: Minimum N/[name] N/[name] Bleeding: Pressure N/[name] N/[name] Hemostasis [name] chieved: 0 N/[name] N/[name] Procedural Pain: 0 N/[name] N/[name] Post Procedural Pain: Procedure was tolerated well N/[name] N/[name] Debridement Treatment Response: 3x1.5x1.7 N/[name] N/[name] Post Debridement Measurements L x W x D (cm) 6.008 N/[name] N/[name] Post Debridement Volume: (cm) Category/Stage IV N/[name] N/[name] Post Debridement Stage: Rash: Yes N/[name] N/[name] Periwound Skin Texture: Dry/Scaly: Yes N/[name] N/[name] Periwound Skin Moisture: No Abnormalities Noted N/[name]  N/[name] Periwound Skin Color: No Abnormality N/[name] N/[name] Temperature: Debridement N/[name] N/[name] Procedures Performed: Treatment Notes Electronic Signature(s) Signed: 01/14/2022 2:30:51 PM By: Fredirick Maudlin MD FACS Entered By: Fredirick Maudlin on 01/14/2022 14:30:51 -------------------------------------------------------------------------------- Multi-Disciplinary Care Plan Details Patient Name: Date of Service: [name] [name]. 01/14/2022 1:15 PM Medical Record Number: [id number] Patient Account Number: [id number] Date of Birth/Sex: Treating RN: 09/15/1980 (41 y.o. [name] Primary Care Phylicia Mcgaugh: Ronnie Doss Other Clinician: Referring Rakeisha Nyce: Treating Kinley Dozier/Extender: Virgel Gess Weeks in Treatment: [place] reviewed with physician Active Inactive Pressure Nursing Diagnoses: Knowledge deficit related to management of pressures ulcers Potential for impaired tissue integrity related to pressure, friction, moisture, and shear Goals: Patient will remain free from development of additional pressure ulcers Date Initiated: 06/27/2016 Date Inactivated: 01/17/2019 Target Resolution Date: 12/24/2018 Goal Status: Met Patient/caregiver will verbalize risk factors for pressure ulcer development Date Initiated: 06/27/2016 Date Inactivated: 07/08/2017 Target Resolution Date: 07/08/2017 Goal Status: Met Patient/caregiver will verbalize understanding of pressure ulcer management Date Initiated: 06/27/2016 Target Resolution Date: 02/05/2022 Goal Status: Active Interventions: Assess: immobility, friction, shearing, incontinence upon admission and as needed [name], [name] ([id number]) [phone].pdf Page 4 of 7 Assess offloading mechanisms upon admission and as needed Assess potential for pressure ulcer upon admission and as needed Provide education on pressure ulcers Treatment Activities: Patient referred for  pressure reduction/relief devices : 06/27/2016 Pressure reduction/relief device ordered : 06/27/2016 Notes: Wound/Skin Impairment Nursing Diagnoses: Impaired tissue integrity Knowledge deficit related to smoking impact on wound healing Knowledge deficit related to ulceration/compromised skin integrity Goals: Patient will demonstrate [name] reduced rate of smoking or cessation of smoking Date Initiated: 06/27/2016 Date Inactivated: 01/26/2017 Target Resolution Date: 01/08/2017 Goal Status: Unmet Unmet Reason: pt continues to smoke Patient/caregiver will verbalize understanding of skin care regimen Date Initiated: 06/27/2016 Target Resolution Date: 01/29/2022 Goal Status: Active Ulcer/skin breakdown will have [name] volume reduction of 50% by week 8 Date Initiated: 06/27/2016 Date Inactivated: 12/11/2016 Target Resolution Date: 07/25/2016 Goal Status: Met Interventions: Assess patient/caregiver ability to obtain necessary supplies Assess patient/caregiver ability to perform ulcer/skin care regimen upon admission and as needed Assess ulceration(s) every visit Provide education on smoking Provide education on ulcer and skin care Treatment Activities: Skin care regimen initiated : 06/27/2016 Topical wound management initiated : 06/27/2016 Notes: Electronic Signature(s) Signed: 01/14/2022 5:53:41 PM By: Baruch Gouty RN, BSN Entered By: Baruch Gouty on 01/14/2022 14:08:20 -------------------------------------------------------------------------------- Pain Assessment Details Patient Name: Date of Service: [name] [name]. 01/14/2022 1:15 PM Medical Record Number: [id number] Patient Account  Number: [id number] Date of Birth/Sex: Treating RN: [birth year] (41 y.o. [name] Primary Care Evah Rashid: Ronnie Doss Other Clinician: Referring Izzac Rockett: Treating Uri Turnbough/Extender: Virgel Gess Weeks in Treatment: 289 Active Problems Location of Pain Severity  and Description of Pain Patient Has Paino Yes Site Locations Pain Location: [name], [name] ([id number]) [id number] Page 5 of 7 Pain Location: Generalized Pain With Dressing Change: No Rate the pain. Current Pain Level: 5 Worst Pain Level: 7 Least Pain Level: 4 Character of Pain Describe the Pain: Aching Pain Management and Medication Current Pain Management: Medication: Yes Is the Current Pain Management Adequate: Adequate How does your wound impact your activities of daily livingo Sleep: No Bathing: No Appetite: No Relationship With Others: No Bladder Continence: No Emotions: No Bowel Continence: No Work: No Toileting: No Drive: No Dressing: No Hobbies: No Notes reports chronic pain in hip today Electronic Signature(s) Signed: 01/14/2022 5:53:41 PM By: Baruch Gouty RN, BSN Entered By: Baruch Gouty on 01/14/2022 13:50:11 -------------------------------------------------------------------------------- Patient/Caregiver Education Details Patient Name: Date of Service: [name] 11/14/2023andnbsp1:15 PM Medical Record Number: [id number] Patient Account Number: [id number] Date of Birth/Gender: Treating RN: 1980/06/17 (41 y.o. [name] Primary Care Physician: Ronnie Doss Other Clinician: Referring Physician: Treating Physician/Extender: Lizbeth Bark in Treatment: 289 Education Assessment Education Provided To: Patient Education Topics Provided Pressure: Methods: Explain/Verbal Responses: Reinforcements needed, State content correctly Wound/Skin Impairment: Methods: Explain/Verbal Responses: Reinforcements needed, State content correctly [name], [name] ([id number]) [id number] Page 6 of 7 Electronic Signature(s) Signed: 01/14/2022 5:53:41 PM By: Baruch Gouty RN, BSN Entered By: Baruch Gouty on 01/14/2022  14:08:51 -------------------------------------------------------------------------------- Wound Assessment Details Patient Name: Date of Service: [name], [name] 01/14/2022 1:15 PM Medical Record Number: [id number] Patient Account Number: [id number] Date of Birth/Sex: Treating RN: 1980-12-10 (41 y.o. [name] Primary Care Nayelis Bonito: Ronnie Doss Other Clinician: Referring Xena Propst: Treating Alece Koppel/Extender: Virgel Gess Weeks in Treatment: 289 Wound Status Wound Number: 1 Primary Etiology: Pressure Ulcer Wound Location: Medial Sacrum Wound Status: Open Wounding Event: Pressure Injury Comorbid History: Anemia, Osteomyelitis Date Acquired: 05/15/2016 Weeks Of Treatment: 289 Clustered Wound: No Photos Wound Measurements Length: (cm) 3 Width: (cm) 1.5 Depth: (cm) 1.7 Area: (cm) 3.534 Volume: (cm) 6.008 % Reduction in Area: 89.3% % Reduction in Volume: 95.8% Epithelialization: None Undermining: Yes Starting Position (o'clock): 1 Ending Position (o'clock): 4 Maximum Distance: (cm) 2 Wound Description Classification: Category/Stage IV Wound Margin: Distinct, outline attached Exudate Amount: Medium Exudate Type: Serosanguineous Exudate Color: red, brown Foul Odor After Cleansing: No Slough/Fibrino No Wound Bed Granulation Amount: Large (67-100%) Exposed Structure Granulation Quality: Red, Pink Fascia Exposed: No Necrotic Amount: Small (1-33%) Fat Layer (Subcutaneous Tissue) Exposed: Yes Necrotic Quality: Adherent Slough Tendon Exposed: No Muscle Exposed: No Joint Exposed: No Bone Exposed: No Periwound Skin Texture Texture Color No Abnormalities Noted: No No Abnormalities Noted: Yes Rash: Yes Temperature / Pain [name], [name] ([id number]) [id number] Page 7 of 7 Temperature: No Abnormality Moisture No Abnormalities Noted: No Dry / Scaly: Yes Treatment Notes Wound #1 (Sacrum) Wound  Laterality: Medial Cleanser Soap and Water Discharge Instruction: May shower and wash wound with dial antibacterial soap and water prior to dressing change. Anasept Antimicrobial Skin and Wound Cleanser, 8 (oz) Discharge Instruction: Cleanse the wound with Anasept cleanser prior to applying [name] clean dressing using gauze sponges, not tissue or cotton balls. Peri-Wound Care Zinc Oxide Ointment 30g tube Discharge Instruction: Apply Zinc Oxide to periwound as needed for moisture Topical Primary Dressing Anasept Antimicrobial  Wound Gel, 1.5 (oz) tube Discharge Instruction: moisten gauze with anasept and pack wound Secondary Dressing ABD Pad, 5x9 Discharge Instruction: Apply over primary dressing as directed. Woven Gauze Sponge, Non-Sterile 4x4 in Discharge Instruction: Apply over primary dressing as directed. Secured With 44M Medipore H Soft Cloth Surgical T ape, 4 x 10 (in/yd) Discharge Instruction: Secure with tape as directed. Compression Wrap Compression Stockings Add-Ons Electronic Signature(s) Signed: 01/14/2022 5:53:41 PM By: Baruch Gouty RN, BSN Entered By: Baruch Gouty on 01/14/2022 14:11:26 -------------------------------------------------------------------------------- Vitals Details Patient Name: Date of Service: [name] [name]. 01/14/2022 1:15 PM Medical Record Number: [id number] Patient Account Number: [id number] Date of Birth/Sex: Treating RN: [birth year] (41 y.o. [name] Primary Care Atleigh Gruen: Ronnie Doss Other Clinician: Referring Basim Bartnik: Treating [place]/Extender: Virgel Gess Weeks in Treatment: 289 Vital Signs Time Taken: 13:48 Temperature (F): 98.7 Height (in): 65 Pulse (bpm): 94 Weight (lbs): 201 Respiratory Rate (breaths/min): 18 Body Mass Index (BMI): 33.4 Blood Pressure (mmHg): 113/79 Reference Range: 80 - 120 mg / dl Electronic Signature(s) Signed: 01/14/2022 5:53:41 PM By: Baruch Gouty RN,  BSN Entered By: Baruch Gouty on 01/14/2022 13:49:17

## 2022-01-17 ENCOUNTER — Other Ambulatory Visit: Payer: Self-pay | Admitting: Family Medicine

## 2022-01-17 DIAGNOSIS — N3000 Acute cystitis without hematuria: Secondary | ICD-10-CM

## 2022-01-17 LAB — URINE CULTURE

## 2022-01-17 MED ORDER — FOSFOMYCIN TROMETHAMINE 3 G PO PACK: 3.0000 g | PACK | Freq: Once | ORAL | 0 refills | Status: AC

## 2022-01-27 LAB — DRUG SCREEN 10 W/CONF, SERUM
Amphetamines, IA: NEGATIVE ng/mL
Barbiturates, IA: NEGATIVE ug/mL
Benzodiazepines, IA: NEGATIVE ng/mL
Cocaine & Metabolite, IA: NEGATIVE ng/mL
Methadone, IA: NEGATIVE ng/mL
Opiates, IA: NEGATIVE ng/mL
Oxycodones, IA: POSITIVE ng/mL — AB
Phencyclidine, IA: NEGATIVE ng/mL
Propoxyphene, IA: NEGATIVE ng/mL
THC(Marijuana) Metabolite, IA: NEGATIVE ng/mL

## 2022-01-27 LAB — BASIC METABOLIC PANEL
BUN/Creatinine Ratio: 29 — ABNORMAL HIGH (ref 9–23)
BUN: 17 mg/dL (ref 6–24)
CO2: 21 mmol/L (ref 20–29)
Calcium: 9.1 mg/dL (ref 8.7–10.2)
Chloride: 99 mmol/L (ref 96–106)
Creatinine, Ser: 0.59 mg/dL (ref 0.57–1.00)
Glucose: 121 mg/dL — ABNORMAL HIGH (ref 70–99)
Potassium: 4.4 mmol/L (ref 3.5–5.2)
Sodium: 137 mmol/L (ref 134–144)
eGFR: 117 mL/min/{1.73_m2} (ref >59)

## 2022-01-27 LAB — CBC
Hematocrit: 30.6 % — ABNORMAL LOW (ref 34.0–46.6)
Hemoglobin: 9.6 g/dL — ABNORMAL LOW (ref 11.1–15.9)
MCH: 25.9 pg — ABNORMAL LOW (ref 26.6–33.0)
MCHC: 31.4 g/dL — ABNORMAL LOW (ref 31.5–35.7)
MCV: 83 fL (ref 79–97)
Platelets: 590 10*3/uL — ABNORMAL HIGH (ref 150–450)
RBC: 3.7 x10E6/uL — ABNORMAL LOW (ref 3.77–5.28)
RDW: 14.8 % (ref 11.7–15.4)
WBC: 12.5 10*3/uL — ABNORMAL HIGH (ref 3.4–10.8)

## 2022-01-27 LAB — OXYCODONES,MS,WB/SP RFX
Oxycocone: 45.6 ng/mL
Oxycodones Confirmation: POSITIVE
Oxymorphone: NEGATIVE ng/mL

## 2022-01-27 LAB — T4, FREE: Free T4: 1.14 ng/dL (ref 0.82–1.77)

## 2022-01-27 LAB — TSH: TSH: 7.94 u[IU]/mL — ABNORMAL HIGH (ref 0.450–4.500)

## 2022-01-29 ENCOUNTER — Telehealth: Payer: Self-pay | Admitting: *Deleted

## 2022-01-29 NOTE — Telephone Encounter (Signed)
Prior authorization submitted via [employer], had to fax hard copy due to electronic transmisssion error

## 2022-01-30 NOTE — Telephone Encounter (Signed)
Prior Approval #:[id number] [place] Type:PHARMACY Recipient:Jermika RANTZ Recipient RN:[id number] Submission date:01/29/2022  Status:APPROVED  Effective Begin Date:01/29/2022  Effective End Date:07/28/2022  Payer:[place] [employer]

## 2022-02-04 ENCOUNTER — Encounter (HOSPITAL_BASED_OUTPATIENT_CLINIC_OR_DEPARTMENT_OTHER): Payer: Medicaid Other | Admitting: General Surgery

## 2022-02-13 ENCOUNTER — Ambulatory Visit (INDEPENDENT_AMBULATORY_CARE_PROVIDER_SITE_OTHER): Payer: Medicaid Other | Admitting: Family Medicine

## 2022-02-13 ENCOUNTER — Encounter: Payer: Self-pay | Admitting: Family Medicine

## 2022-02-13 DIAGNOSIS — R410 Disorientation, unspecified: Secondary | ICD-10-CM

## 2022-02-13 DIAGNOSIS — R5383 Other fatigue: Secondary | ICD-10-CM | POA: Diagnosis not present

## 2022-02-13 DIAGNOSIS — R8290 Unspecified abnormal findings in urine: Secondary | ICD-10-CM | POA: Diagnosis not present

## 2022-02-13 LAB — URINALYSIS, ROUTINE W REFLEX MICROSCOPIC
Bilirubin, UA: NEGATIVE
Glucose, UA: NEGATIVE
Ketones, UA: NEGATIVE
Nitrite, UA: NEGATIVE
Specific Gravity, UA: 1.015 (ref 1.005–1.030)
Urobilinogen, Ur: 0.2 mg/dL (ref 0.2–1.0)
pH, UA: 7 (ref 5.0–7.5)

## 2022-02-13 LAB — MICROSCOPIC EXAMINATION
RBC, Urine: >30 /hpf — AB (ref 0–2)
Renal Epithel, UA: NONE SEEN /hpf
WBC, UA: >30 /hpf — AB (ref 0–5)
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
## 2022-02-17 LAB — URINE CULTURE

## 2022-02-19 ENCOUNTER — Ambulatory Visit: Payer: Medicaid Other

## 2022-02-20 ENCOUNTER — Telehealth: Payer: Self-pay | Admitting: Family Medicine

## 2022-02-21 MED ORDER — HYDROXYZINE HCL 10 MG PO TABS: 10.0000 mg | ORAL_TABLET | Freq: Three times a day (TID) | ORAL | 3 refills | Status: DC | PRN

## 2022-02-21 NOTE — Telephone Encounter (Signed)
Pt mom aware of refills

## 2022-02-21 NOTE — Addendum Note (Signed)
Addended by: Everlean Cherry on: 02/21/2022 01:49 PM   Modules accepted: Orders

## 2022-02-25 ENCOUNTER — Ambulatory Visit: Payer: Medicaid Other | Admitting: Family Medicine

## 2022-03-07 ENCOUNTER — Encounter (HOSPITAL_BASED_OUTPATIENT_CLINIC_OR_DEPARTMENT_OTHER): Payer: Medicaid Other | Admitting: General Surgery

## 2022-03-11 ENCOUNTER — Ambulatory Visit (INDEPENDENT_AMBULATORY_CARE_PROVIDER_SITE_OTHER): Payer: Medicaid Other | Admitting: Family Medicine

## 2022-03-11 ENCOUNTER — Encounter: Payer: Self-pay | Admitting: Family Medicine

## 2022-03-11 VITALS — BP 115/81 | HR 99 | Temp 97.4°F | Ht 66.0 in | Wt 271.2 lb

## 2022-03-11 DIAGNOSIS — G8929 Other chronic pain: Secondary | ICD-10-CM

## 2022-03-11 DIAGNOSIS — T1490XS Injury, unspecified, sequela: Secondary | ICD-10-CM

## 2022-03-11 DIAGNOSIS — N319 Neuromuscular dysfunction of bladder, unspecified: Secondary | ICD-10-CM | POA: Diagnosis not present

## 2022-03-11 DIAGNOSIS — M8668 Other chronic osteomyelitis, other site: Secondary | ICD-10-CM

## 2022-03-11 DIAGNOSIS — Z789 Other specified health status: Secondary | ICD-10-CM

## 2022-03-11 DIAGNOSIS — T8389XD Other specified complication of genitourinary prosthetic devices, implants and grafts, subsequent encounter: Secondary | ICD-10-CM | POA: Diagnosis not present

## 2022-03-11 DIAGNOSIS — N368 Other specified disorders of urethra: Secondary | ICD-10-CM

## 2022-03-11 DIAGNOSIS — G8220 Paraplegia, unspecified: Secondary | ICD-10-CM

## 2022-03-11 DIAGNOSIS — L89154 Pressure ulcer of sacral region, stage 4: Secondary | ICD-10-CM

## 2022-03-11 DIAGNOSIS — N898 Other specified noninflammatory disorders of vagina: Secondary | ICD-10-CM

## 2022-03-11 DIAGNOSIS — G40909 Epilepsy, unspecified, not intractable, without status epilepticus: Secondary | ICD-10-CM

## 2022-03-11 DIAGNOSIS — R8290 Unspecified abnormal findings in urine: Secondary | ICD-10-CM

## 2022-03-11 DIAGNOSIS — D75839 Thrombocytosis, unspecified: Secondary | ICD-10-CM

## 2022-03-11 LAB — URINALYSIS, ROUTINE W REFLEX MICROSCOPIC
Bilirubin, UA: NEGATIVE
Glucose, UA: NEGATIVE
Ketones, UA: NEGATIVE
Nitrite, UA: NEGATIVE
Specific Gravity, UA: 1.01 (ref 1.005–1.030)
Urobilinogen, Ur: 0.2 mg/dL (ref 0.2–1.0)
pH, UA: 7 (ref 5.0–7.5)

## 2022-03-11 LAB — MICROSCOPIC EXAMINATION: Renal Epithel, UA: NONE SEEN /hpf

## 2022-03-11 LAB — WET PREP FOR TRICH, YEAST, CLUE
Clue Cell Exam: NEGATIVE
Trichomonas Exam: NEGATIVE
Yeast Exam: NEGATIVE

## 2022-03-11 MED ORDER — OXYCODONE HCL 10 MG PO TABS: 10.0000 mg | ORAL_TABLET | Freq: Three times a day (TID) | ORAL | 0 refills | Status: DC

## 2022-03-11 MED ORDER — KETOROLAC TROMETHAMINE 30 MG/ML IJ SOLN
30.0000 mg | Freq: Once | INTRAMUSCULAR | Status: AC
  Administered 2022-03-11: 30 mg via INTRAMUSCULAR

## 2022-03-11 MED ORDER — HYDROXYZINE HCL 25 MG PO TABS: 25.0000 mg | ORAL_TABLET | Freq: Three times a day (TID) | ORAL | 3 refills | Status: DC | PRN
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
## 2022-03-12 ENCOUNTER — Inpatient Hospital Stay (HOSPITAL_COMMUNITY)
Admission: EM | Admit: 2022-03-12 | Discharge: 2022-03-16 | DRG: 698 | Disposition: A | Discharge status: Home / Self Care | Payer: Medicaid Other | Attending: Internal Medicine | Admitting: Internal Medicine

## 2022-03-12 ENCOUNTER — Other Ambulatory Visit: Payer: Self-pay | Admitting: Family Medicine

## 2022-03-12 ENCOUNTER — Emergency Department (HOSPITAL_COMMUNITY): Payer: Medicaid Other

## 2022-03-12 ENCOUNTER — Telehealth: Payer: Self-pay | Admitting: Family Medicine

## 2022-03-12 ENCOUNTER — Encounter (HOSPITAL_COMMUNITY): Payer: Self-pay | Admitting: Emergency Medicine

## 2022-03-12 ENCOUNTER — Other Ambulatory Visit: Payer: Self-pay

## 2022-03-12 ENCOUNTER — Encounter: Payer: Self-pay | Admitting: Family Medicine

## 2022-03-12 DIAGNOSIS — N3000 Acute cystitis without hematuria: Secondary | ICD-10-CM | POA: Diagnosis present

## 2022-03-12 DIAGNOSIS — R569 Unspecified convulsions: Secondary | ICD-10-CM

## 2022-03-12 DIAGNOSIS — Z8049 Family history of malignant neoplasm of other genital organs: Secondary | ICD-10-CM

## 2022-03-12 DIAGNOSIS — N809 Endometriosis, unspecified: Secondary | ICD-10-CM | POA: Diagnosis present

## 2022-03-12 DIAGNOSIS — K219 Gastro-esophageal reflux disease without esophagitis: Secondary | ICD-10-CM | POA: Diagnosis present

## 2022-03-12 DIAGNOSIS — Z8249 Family history of ischemic heart disease and other diseases of the circulatory system: Secondary | ICD-10-CM

## 2022-03-12 DIAGNOSIS — I10 Essential (primary) hypertension: Secondary | ICD-10-CM | POA: Diagnosis present

## 2022-03-12 DIAGNOSIS — G894 Chronic pain syndrome: Secondary | ICD-10-CM | POA: Diagnosis present

## 2022-03-12 DIAGNOSIS — Z83438 Family history of other disorder of lipoprotein metabolism and other lipidemia: Secondary | ICD-10-CM

## 2022-03-12 DIAGNOSIS — D75839 Thrombocytosis, unspecified: Secondary | ICD-10-CM | POA: Insufficient documentation

## 2022-03-12 DIAGNOSIS — N390 Urinary tract infection, site not specified: Secondary | ICD-10-CM | POA: Diagnosis present

## 2022-03-12 DIAGNOSIS — B9620 Unspecified Escherichia coli [E. coli] as the cause of diseases classified elsewhere: Secondary | ICD-10-CM | POA: Diagnosis present

## 2022-03-12 DIAGNOSIS — N319 Neuromuscular dysfunction of bladder, unspecified: Secondary | ICD-10-CM | POA: Diagnosis present

## 2022-03-12 DIAGNOSIS — E66813 Obesity, class 3: Secondary | ICD-10-CM | POA: Diagnosis present

## 2022-03-12 DIAGNOSIS — Z936 Other artificial openings of urinary tract status: Secondary | ICD-10-CM

## 2022-03-12 DIAGNOSIS — L89629 Pressure ulcer of left heel, unspecified stage: Secondary | ICD-10-CM | POA: Diagnosis present

## 2022-03-12 DIAGNOSIS — E876 Hypokalemia: Secondary | ICD-10-CM | POA: Diagnosis present

## 2022-03-12 DIAGNOSIS — E871 Hypo-osmolality and hyponatremia: Secondary | ICD-10-CM | POA: Diagnosis present

## 2022-03-12 DIAGNOSIS — E441 Mild protein-calorie malnutrition: Secondary | ICD-10-CM | POA: Diagnosis present

## 2022-03-12 DIAGNOSIS — G8220 Paraplegia, unspecified: Secondary | ICD-10-CM | POA: Diagnosis present

## 2022-03-12 DIAGNOSIS — Z1624 Resistance to multiple antibiotics: Secondary | ICD-10-CM | POA: Diagnosis present

## 2022-03-12 DIAGNOSIS — B952 Enterococcus as the cause of diseases classified elsewhere: Secondary | ICD-10-CM | POA: Diagnosis present

## 2022-03-12 DIAGNOSIS — Z823 Family history of stroke: Secondary | ICD-10-CM

## 2022-03-12 DIAGNOSIS — Z9049 Acquired absence of other specified parts of digestive tract: Secondary | ICD-10-CM

## 2022-03-12 DIAGNOSIS — Z800 Family history of malignant neoplasm of digestive organs: Secondary | ICD-10-CM

## 2022-03-12 DIAGNOSIS — Z87891 Personal history of nicotine dependence: Secondary | ICD-10-CM

## 2022-03-12 DIAGNOSIS — E039 Hypothyroidism, unspecified: Secondary | ICD-10-CM | POA: Diagnosis present

## 2022-03-12 DIAGNOSIS — Z8349 Family history of other endocrine, nutritional and metabolic diseases: Secondary | ICD-10-CM

## 2022-03-12 DIAGNOSIS — Z79899 Other long term (current) drug therapy: Secondary | ICD-10-CM

## 2022-03-12 DIAGNOSIS — T83598A Infection and inflammatory reaction due to other prosthetic device, implant and graft in urinary system, initial encounter: Principal | ICD-10-CM | POA: Diagnosis present

## 2022-03-12 DIAGNOSIS — F339 Major depressive disorder, recurrent, unspecified: Secondary | ICD-10-CM | POA: Diagnosis present

## 2022-03-12 DIAGNOSIS — L89619 Pressure ulcer of right heel, unspecified stage: Secondary | ICD-10-CM | POA: Diagnosis present

## 2022-03-12 DIAGNOSIS — Z981 Arthrodesis status: Secondary | ICD-10-CM

## 2022-03-12 DIAGNOSIS — Z888 Allergy status to other drugs, medicaments and biological substances status: Secondary | ICD-10-CM

## 2022-03-12 DIAGNOSIS — Z6841 Body Mass Index (BMI) 40.0 and over, adult: Secondary | ICD-10-CM

## 2022-03-12 DIAGNOSIS — D649 Anemia, unspecified: Secondary | ICD-10-CM | POA: Diagnosis present

## 2022-03-12 DIAGNOSIS — Z833 Family history of diabetes mellitus: Secondary | ICD-10-CM

## 2022-03-12 DIAGNOSIS — E46 Unspecified protein-calorie malnutrition: Secondary | ICD-10-CM | POA: Insufficient documentation

## 2022-03-12 DIAGNOSIS — M62838 Other muscle spasm: Secondary | ICD-10-CM | POA: Diagnosis present

## 2022-03-12 DIAGNOSIS — N760 Acute vaginitis: Secondary | ICD-10-CM | POA: Diagnosis present

## 2022-03-12 DIAGNOSIS — F419 Anxiety disorder, unspecified: Secondary | ICD-10-CM | POA: Insufficient documentation

## 2022-03-12 DIAGNOSIS — L89154 Pressure ulcer of sacral region, stage 4: Secondary | ICD-10-CM | POA: Diagnosis present

## 2022-03-12 DIAGNOSIS — B9689 Other specified bacterial agents as the cause of diseases classified elsewhere: Secondary | ICD-10-CM | POA: Diagnosis present

## 2022-03-12 DIAGNOSIS — Y846 Urinary catheterization as the cause of abnormal reaction of the patient, or of later complication, without mention of misadventure at the time of the procedure: Secondary | ICD-10-CM | POA: Diagnosis present

## 2022-03-12 DIAGNOSIS — E785 Hyperlipidemia, unspecified: Secondary | ICD-10-CM | POA: Diagnosis present

## 2022-03-12 DIAGNOSIS — E8809 Other disorders of plasma-protein metabolism, not elsewhere classified: Secondary | ICD-10-CM | POA: Insufficient documentation

## 2022-03-12 LAB — URINALYSIS, ROUTINE W REFLEX MICROSCOPIC
Bilirubin Urine: NEGATIVE
Glucose, UA: NEGATIVE mg/dL
Hgb urine dipstick: NEGATIVE
Ketones, ur: NEGATIVE mg/dL
Nitrite: POSITIVE — AB
Protein, ur: NEGATIVE mg/dL
Specific Gravity, Urine: 1.008 (ref 1.005–1.030)
pH: 7 (ref 5.0–8.0)

## 2022-03-12 LAB — LACTIC ACID, PLASMA
Lactic Acid, Venous: 1.1 mmol/L (ref 0.5–1.9)
Lactic Acid, Venous: 1.2 mmol/L (ref 0.5–1.9)

## 2022-03-12 LAB — CMP14+EGFR
ALT: 16 IU/L (ref 0–32)
AST: 16 IU/L (ref 0–40)
Albumin/Globulin Ratio: 1.4 (ref 1.2–2.2)
Albumin: 4.2 g/dL (ref 3.9–4.9)
Alkaline Phosphatase: 152 IU/L — ABNORMAL HIGH (ref 44–121)
BUN/Creatinine Ratio: 19 (ref 9–23)
BUN: 12 mg/dL (ref 6–24)
Bilirubin Total: <0.2 mg/dL (ref 0.0–1.2)
CO2: 20 mmol/L (ref 20–29)
Calcium: 9.1 mg/dL (ref 8.7–10.2)
Chloride: 101 mmol/L (ref 96–106)
Creatinine, Ser: 0.64 mg/dL (ref 0.57–1.00)
Globulin, Total: 2.9 g/dL (ref 1.5–4.5)
Glucose: 136 mg/dL — ABNORMAL HIGH (ref 70–99)
Potassium: 3.6 mmol/L (ref 3.5–5.2)
Sodium: 140 mmol/L (ref 134–144)
Total Protein: 7.1 g/dL (ref 6.0–8.5)
eGFR: 114 mL/min/{1.73_m2} (ref >59)

## 2022-03-12 LAB — COMPREHENSIVE METABOLIC PANEL
ALT: 14 U/L (ref 0–44)
AST: 16 U/L (ref 15–41)
Albumin: 3.4 g/dL — ABNORMAL LOW (ref 3.5–5.0)
Alkaline Phosphatase: 115 U/L (ref 38–126)
Anion gap: 8 (ref 5–15)
BUN: 15 mg/dL (ref 6–20)
CO2: 24 mmol/L (ref 22–32)
Calcium: 8.6 mg/dL — ABNORMAL LOW (ref 8.9–10.3)
Chloride: 102 mmol/L (ref 98–111)
Creatinine, Ser: 0.59 mg/dL (ref 0.44–1.00)
GFR, Estimated: >60 mL/min (ref >60)
Glucose, Bld: 107 mg/dL — ABNORMAL HIGH (ref 70–99)
Potassium: 3.7 mmol/L (ref 3.5–5.1)
Sodium: 134 mmol/L — ABNORMAL LOW (ref 135–145)
Total Bilirubin: 0.3 mg/dL (ref 0.3–1.2)
Total Protein: 6.9 g/dL (ref 6.5–8.1)

## 2022-03-12 LAB — CBC WITH DIFFERENTIAL/PLATELET
Abs Immature Granulocytes: 0.02 10*3/uL (ref 0.00–0.07)
Basophils Absolute: 0.1 10*3/uL (ref 0.0–0.2)
Basophils Absolute: 0.1 10*3/uL (ref 0.0–0.1)
Basophils Relative: 1 %
Basos: 0 %
EOS (ABSOLUTE): 0.3 10*3/uL (ref 0.0–0.4)
Eos: 2 %
Eosinophils Absolute: 0.4 10*3/uL (ref 0.0–0.5)
Eosinophils Relative: 4 %
HCT: 32.3 % — ABNORMAL LOW (ref 36.0–46.0)
Hematocrit: 35.4 % (ref 34.0–46.6)
Hemoglobin: 11 g/dL — ABNORMAL LOW (ref 11.1–15.9)
Hemoglobin: 9.6 g/dL — ABNORMAL LOW (ref 12.0–15.0)
Immature Grans (Abs): 0 10*3/uL (ref 0.0–0.1)
Immature Granulocytes: 0 %
Immature Granulocytes: 0 %
Lymphocytes Absolute: 2 10*3/uL (ref 0.7–3.1)
Lymphocytes Relative: 30 %
Lymphs Abs: 3.1 10*3/uL (ref 0.7–4.0)
Lymphs: 16 %
MCH: 25.6 pg — ABNORMAL LOW (ref 26.6–33.0)
MCH: 25.7 pg — ABNORMAL LOW (ref 26.0–34.0)
MCHC: 31.1 g/dL — ABNORMAL LOW (ref 31.5–35.7)
MCHC: 29.7 g/dL — ABNORMAL LOW (ref 30.0–36.0)
MCV: 83 fL (ref 79–97)
MCV: 86.4 fL (ref 80.0–100.0)
Monocytes Absolute: 0.8 10*3/uL (ref 0.1–0.9)
Monocytes Absolute: 0.6 10*3/uL (ref 0.1–1.0)
Monocytes Relative: 6 %
Monocytes: 7 %
Neutro Abs: 6.2 10*3/uL (ref 1.7–7.7)
Neutrophils Absolute: 9.3 10*3/uL — ABNORMAL HIGH (ref 1.4–7.0)
Neutrophils Relative %: 59 %
Neutrophils: 75 %
Platelets: 532 10*3/uL — ABNORMAL HIGH (ref 150–450)
Platelets: 453 10*3/uL — ABNORMAL HIGH (ref 150–400)
RBC: 4.29 x10E6/uL (ref 3.77–5.28)
RBC: 3.74 MIL/uL — ABNORMAL LOW (ref 3.87–5.11)
RDW: 17.8 % — ABNORMAL HIGH (ref 11.7–15.4)
RDW: 18.6 % — ABNORMAL HIGH (ref 11.5–15.5)
WBC: 12.4 10*3/uL — ABNORMAL HIGH (ref 3.4–10.8)
WBC: 10.3 10*3/uL (ref 4.0–10.5)
nRBC: 0 % (ref 0.0–0.2)

## 2022-03-12 LAB — WET PREP, GENITAL
Sperm: NONE SEEN
Trich, Wet Prep: NONE SEEN
WBC, Wet Prep HPF POC: >=10 — AB (ref <10)
Yeast Wet Prep HPF POC: NONE SEEN

## 2022-03-12 LAB — LEVETIRACETAM LEVEL: Levetiracetam Lvl: 50.5 ug/mL — ABNORMAL HIGH (ref 10.0–40.0)

## 2022-03-12 MED ORDER — MORPHINE SULFATE (PF) 4 MG/ML IV SOLN
4.0000 mg | Freq: Once | INTRAVENOUS | Status: AC
  Administered 2022-03-12: 4 mg via INTRAVENOUS
  Filled 2022-03-12: qty 1

## 2022-03-12 MED ORDER — SODIUM CHLORIDE 0.9 % IV SOLN
1.0000 g | Freq: Once | INTRAVENOUS | Status: AC
  Administered 2022-03-12: 1 g via INTRAVENOUS
  Filled 2022-03-12: qty 20

## 2022-03-12 MED ORDER — LEVETIRACETAM 750 MG PO TABS: 1500.0000 mg | ORAL_TABLET | Freq: Two times a day (BID) | ORAL | 1 refills | Status: DC

## 2022-03-12 MED ORDER — METRONIDAZOLE 500 MG PO TABS
500.0000 mg | ORAL_TABLET | Freq: Two times a day (BID) | ORAL | Status: DC
  Administered 2022-03-13: 500 mg via ORAL
  Filled 2022-03-12: qty 1

## 2022-03-12 MED ORDER — LACOSAMIDE 200 MG PO TABS: 200.0000 mg | ORAL_TABLET | Freq: Two times a day (BID) | ORAL | 1 refills | Status: DC

## 2022-03-12 MED ORDER — IOHEXOL 300 MG/ML  SOLN
100.0000 mL | Freq: Once | INTRAMUSCULAR | Status: AC | PRN
  Administered 2022-03-12: 100 mL via INTRAVENOUS

## 2022-03-12 NOTE — Telephone Encounter (Signed)
I'm worried about her.  She has recurrent sepsis and if she is getting purulence from multiple areas, she needs ER evaluation, and likely another admission as her WBC were elevated on blood labs.

## 2022-03-12 NOTE — ED Notes (Signed)
Lab at bedside

## 2022-03-12 NOTE — ED Provider Notes (Signed)
[facility] EMERGENCY DEPARTMENT Provider Note   CSN: [id number] Arrival date & time: 03/12/22  1853     History  Chief Complaint  Patient presents with   Vaginal Discharge    [name] is a 42 y.o. female.  Patient with history of paraplegia, chronic osteomyelitis, hypertension, hyperlipidemia, presents today with complaints of fatigue, weakness, and nausea. Patient states that over the past few days she has had these symptoms and is concerned that she has a UTI. She has a ureterostomy and is concerned that there is green appearing purulence in her bag. She also states that she is having some vaginal discharge. She has a history of vaginal closure surgery a few months ago and is concerned that she could have a surgical site infection. Of note, patient has been admitted twice in the last few months for sepsis. Most recently she was admitted to [facility] for urosepsis with urine culture that grew multi-drug resistant e coli. She was started on Avycaz for same and then sent home with doxycycline. She is concerned because her symptoms are similar to when she required admission previously. Of note, patient does also have a stage IV sacral ulcer.  The history is provided by the patient. No language interpreter was used.  Vaginal Discharge Associated symptoms: dysuria        Home Medications Prior to Admission medications   Medication Sig Start Date End Date Taking? Authorizing Provider  acetaminophen (TYLENOL) 500 MG tablet Take 1,000 mg by mouth daily as needed for moderate pain or headache.   Yes [provider]  buPROPion (WELLBUTRIN SR) 150 MG 12 hr tablet Take 1 tablet (150 mg total) by mouth 2 (two) times daily. 01/13/22  Yes Gottschalk, Ashly M, DO  busPIRone (BUSPAR) 15 MG tablet Take 1 tablet (15 mg total) by mouth 2 (two) times daily. 01/13/22  Yes Gottschalk, Leatrice Jewels M, DO  Cholecalciferol (VITAMIN D3) 50 MCG (2000 UT) TABS TAKE 1 TABLET BY MOUTH EVERY DAY 08/22/21  Yes  Loman Brooklyn, [name]  citalopram (CELEXA) 40 MG tablet Take 1 tablet (40 mg total) by mouth daily. 11/26/21  Yes [name] F, [name]  [employer] VITAMIN B12 1000 MCG tablet TAKE 1 TABLET BY MOUTH EVERY DAY 11/02/21  Yes Loman Brooklyn, [name]  cyclobenzaprine (FLEXERIL) 5 MG tablet Take 1 tablet (5 mg total) by mouth 3 (three) times daily. 11/26/21  Yes [name] F, [name]  diclofenac Sodium (VOLTAREN) 1 % GEL APPLY 2 GRAMS TO AFFECTED AREA 4 TIMES A DAY Patient taking differently: Apply 2 g topically 4 (four) times daily as needed (pain). 02/21/21  Yes [name] F, [name]  docusate sodium (COLACE) 100 MG capsule Take 1 capsule (100 mg total) by mouth every 12 (twelve) hours as needed for mild constipation. Patient taking differently: Take 100 mg by mouth daily as needed for mild constipation. 01/13/22  Yes Gottschalk, Ashly M, DO  fluticasone (FLONASE) 50 MCG/ACT nasal spray Place 2 sprays into both nostrils daily. 11/26/21  Yes Loman Brooklyn, [name]  gabapentin (NEURONTIN) 600 MG tablet Take 1 tablet (600 mg total) by mouth in the morning, at noon, in the evening, and at bedtime. 01/13/22  Yes Ronnie Doss M, DO  hydrOXYzine (ATARAX) 25 MG tablet Take 1 tablet (25 mg total) by mouth 3 (three) times daily as needed for anxiety. 03/11/22  Yes Gottschalk, Ashly M, DO  Ipratropium-Albuterol (COMBIVENT RESPIMAT) 20-100 MCG/ACT AERS respimat Inhale 1 puff into the lungs every 6 (six) hours as  needed. 11/26/21  Yes [name] F, [name]  lacosamide (VIMPAT) 200 MG TABS tablet Take 1 tablet (200 mg total) by mouth 2 (two) times daily. 03/12/22  Yes Gottschalk, Ashly M, DO  levETIRAcetam (KEPPRA) 750 MG tablet Take 2 tablets (1,500 mg total) by mouth 2 (two) times daily. 03/12/22  Yes Gottschalk, Leatrice Jewels M, DO  levocetirizine (XYZAL) 5 MG tablet Take 1 tablet (5 mg total) by mouth every evening. 11/26/21  Yes [name] F, [name]  levothyroxine (SYNTHROID) 88 MCG tablet Take 1 tablet (88 mcg total) by mouth daily  before breakfast. 11/26/21  Yes [name] F, [name]  Lidocaine 4 % PTCH Place 1 patch onto the skin daily as needed (pain).   Yes [provider]  magnesium oxide (MAG-OX) 400 (240 Mg) MG tablet Take 400 mg by mouth daily.   Yes [provider]  nystatin (MYCOSTATIN/NYSTOP) powder Apply 1 application topically 3 (three) times daily. Patient taking differently: Apply 1 application  topically 3 (three) times daily as needed (yeast). 09/12/20  Yes Loman Brooklyn, [name]  omeprazole (PRILOSEC) 20 MG capsule Take 1 capsule (20 mg total) by mouth daily. 11/26/21  Yes [name] F, [name]  oxybutynin (DITROPAN-XL) 5 MG 24 hr tablet Take 1 tablet (5 mg total) by mouth daily. 01/13/22  Yes Gottschalk, Leatrice Jewels M, DO  Oxycodone HCl 10 MG TABS Take 1 tablet (10 mg total) by mouth in the morning, at noon, and at bedtime. 04/06/22  Yes Gottschalk, Leatrice Jewels M, DO  PAIN MANAGEMENT INTRATHECAL, IT, PUMP 1 each by Intrathecal route Continuous EPIDURAL. Intrathecal (IT) medication:  Baclofen   Yes [provider]  Oxycodone HCl 10 MG TABS Take 1 tablet (10 mg total) by mouth in the morning, at noon, and at bedtime. Patient not taking: Reported on 03/12/2022 05/05/22   [name], DO  Oxycodone HCl 10 MG TABS Take 1 tablet (10 mg total) by mouth in the morning, at noon, and at bedtime. Patient not taking: Reported on 03/12/2022 06/04/22   [name], DO  sulfamethoxazole-trimethoprim (BACTRIM) 400-80 MG tablet Take 1 tablet by mouth daily. Patient not taking: Reported on 03/12/2022 01/13/22   [name], DO      Allergies    Macrobid [nitrofurantoin] and Lisinopril    Review of Systems   Review of Systems  Genitourinary:  Positive for dysuria and vaginal discharge.  All other systems reviewed and are negative.   Physical Exam Updated Vital Signs BP 129/89 (BP Location: Left Wrist)   Pulse 92   Temp 98.8 F (37.1 C) (Oral)   Resp 18   Ht '5\' 6"'$  (1.676 m)   Wt 122.9  kg   SpO2 95%   BMI 43.74 kg/m  Physical Exam Vitals and nursing note reviewed. Exam conducted with a chaperone present.  Constitutional:      General: She is not in acute distress.    Appearance: Normal appearance. She is normal weight. She is not ill-appearing, toxic-appearing or diaphoretic.  HENT:     Head: Normocephalic and atraumatic.  Cardiovascular:     Rate and Rhythm: Normal rate and regular rhythm.     Heart sounds: Normal heart sounds.  Pulmonary:     Effort: Pulmonary effort is normal. No respiratory distress.     Breath sounds: Normal breath sounds.  Abdominal:     General: Abdomen is flat.     Palpations: Abdomen is soft.     Comments: Cloudy urine present in stoma bag.  Genitourinary:    Comments: White discharge present in vaginal area. No tenderness or erythema Musculoskeletal:        General: Normal range of motion.     Cervical back: Normal range of motion.  Skin:    General: Skin is warm and dry.     Comments: Well healing sacral wound present without purulence  Neurological:     General: No focal deficit present.     Mental Status: She is alert.  Psychiatric:        Mood and Affect: Mood normal.        Behavior: Behavior normal.     ED Results / Procedures / Treatments   Labs (all labs ordered are listed, but only abnormal results are displayed) Labs Reviewed  WET PREP, GENITAL - Abnormal; Notable for the following components:      Result Value   Clue Cells Wet Prep HPF POC PRESENT (*)    WBC, Wet Prep HPF POC >=10 (*)    All other components within normal limits  CBC WITH DIFFERENTIAL/PLATELET - Abnormal; Notable for the following components:   RBC 3.74 (*)    Hemoglobin 9.6 (*)    HCT 32.3 (*)    MCH 25.7 (*)    MCHC 29.7 (*)    RDW 18.6 (*)    Platelets 453 (*)    All other components within normal limits  COMPREHENSIVE METABOLIC PANEL - Abnormal; Notable for the following components:   Sodium 134 (*)    Glucose, Bld 107 (*)     Calcium 8.6 (*)    Albumin 3.4 (*)    All other components within normal limits  URINALYSIS, ROUTINE W REFLEX MICROSCOPIC - Abnormal; Notable for the following components:   APPearance HAZY (*)    Nitrite POSITIVE (*)    Leukocytes,Ua LARGE (*)    Bacteria, UA MANY (*)    All other components within normal limits  URINE CULTURE  LACTIC ACID, PLASMA  LACTIC ACID, PLASMA    EKG None  Radiology CT ABDOMEN PELVIS W CONTRAST  Result Date: 03/12/2022 CLINICAL DATA:  Abdominal pain, acute, nonlocalized. EXAM: CT ABDOMEN AND PELVIS WITH CONTRAST TECHNIQUE: Multidetector CT imaging of the abdomen and pelvis was performed using the standard protocol following bolus administration of intravenous contrast. RADIATION DOSE REDUCTION: This exam was performed according to the departmental dose-optimization program which includes automated exposure control, adjustment of the mA and/or kV according to patient size and/or use of iterative reconstruction technique. CONTRAST:  155m OMNIPAQUE IOHEXOL 300 MG/ML  SOLN COMPARISON:  CT examination dated December 18, 2021 FINDINGS: Lower chest: No acute abnormality. Hepatobiliary: No focal liver abnormality is seen. Status post cholecystectomy. No biliary dilatation. Pancreas: Unremarkable. No pancreatic ductal dilatation or surrounding inflammatory changes. Spleen: Normal in size without focal abnormality. Adrenals/Urinary Tract: Adrenal glands are unremarkable. There is chronic prominence of the left collecting system without evidence of hydronephrosis or ureteral calculus. 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 Postsurgical changes for prior cystectomy. Stomach/Bowel: Stomach is within normal limits. Appendix not identified. No evidence of bowel wall thickening, distention, or inflammatory changes. Large amount of retained colonic stool suggesting constipation  Vascular/Lymphatic: No significant vascular findings are present. No enlarged abdominal or pelvic lymph nodes. Reproductive: Status post hysterectomy. No adnexal masses. Other: No abdominal wall hernia or abnormality. No abdominopelvic ascites. Skin thickening and subcutaneous soft tissue edema about the bilateral ischial tuberosity and about the  lower sacrum concerning for decubitus ulcers. Musculoskeletal: Chronic compression deformity of L1 vertebral body with posterior spinal fusion from T11-L3. No acute osseous abnormality. Postsurgical changes about the lower sacrum/coccyx. IMPRESSION: 1. No CT evidence of acute abdominal/pelvic process. 2. Large amount of retained colonic stool suggesting constipation. 3. 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 4. Chronic compression deformity of L1 vertebral body with posterior spinal fusion from T11-L3. Postsurgical changes about the lower sacrum/coccyx. 5. Skin thickening and soft tissue edema about the bilateral ischial tuberosities as well as sacrum concerning for decubitus ulcers. Electronically Signed   By: Keane Police D.O.   On: 03/12/2022 22:47   DG Chest Portable 1 View  Result Date: 03/12/2022 CLINICAL DATA:  Cough and vaginal discharge. EXAM: PORTABLE CHEST 1 VIEW COMPARISON:  December 18, 2021 FINDINGS: The heart size and mediastinal contours are within normal limits. Both lungs are clear. Postoperative changes seen within the lower thoracic spine. IMPRESSION: No active disease. Electronically Signed   By: Virgina Norfolk M.D.   On: 03/12/2022 22:45    Procedures Procedures    Medications Ordered in ED Medications  morphine (PF) 4 MG/ML injection 4 mg (4 mg Intravenous Given 03/12/22 2211)  iohexol (OMNIPAQUE) 300 MG/ML solution 100 mL (100 mLs Intravenous Contrast Given 03/12/22 2216)    ED Course/ Medical Decision Making/ A&P                            Medical Decision Making Amount and/or Complexity of Data Reviewed Labs: ordered. Radiology: ordered.  Risk Prescription drug management.   This patient is a 42 y.o. female who presents to the ED for concern of fatigue, weakness, nausea, this involves an extensive number of treatment options, and is a complaint that carries with it a high risk of complications and morbidity. The emergent differential diagnosis prior to evaluation includes, but is not limited to,  sepsis, URI, UTI . This is not an exhaustive differential.   Past Medical History / Co-morbidities / Social History: history of paraplegia, chronic osteomyelitis, hypertension, hyperlipidemia  Additional history: Chart reviewed. Pertinent results include: patient recently admitted to [facility] with multidrug-resistant E. coli on culture.  Physical Exam: Physical exam performed. The pertinent findings include: Urine infectious appearing.  White vaginal discharge present.  Well-healing sacral wound present.  Lab Tests: I ordered, and personally interpreted labs.  The pertinent results include:  UA infectious. Hgb 9.6 down from 11 2 days ago. Wet prep with clue cells and >10 WBCs   Imaging Studies: I ordered imaging studies including CT abdomen pelvis with contrast, CXR. I independently visualized and interpreted imaging which showed   CT:   1. No CT evidence of acute abdominal/pelvic process. 2. Large amount of retained colonic stool suggesting constipation. 3. 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 4. Chronic compression deformity of L1 vertebral body with posterior spinal fusion from T11-L3. Postsurgical changes about the lower sacrum/coccyx. 5. Skin thickening and soft tissue edema about the bilateral ischial tuberosities as well as sacrum concerning for decubitus ulcers.  CXR: no active disease  I agree with the radiologist  interpretation.   Medications: I ordered medication including morphine, meropenem, flagyl  for pain, UTI, BV. Reevaluation of the patient after these medicines showed that the patient improved. I have reviewed the patients home medicines  and have made adjustments as needed.  Consultations Obtained: I requested consultation with the pharmacy on call,  and discussed lab and imaging findings as well as pertinent plan - they recommend: meropenem for UTI given patients history   Disposition:  Patient presents today with concerns for UTI.  She is afebrile, nontoxic-appearing, and in no acute distress with reassuring vital signs.  Laboratory evaluation does reveal UTI and bacterial vaginosis.  Discussed with pharmacy given her history of multidrug-resistant E. coli who recommended she start with meropenem for management.  Given her history of recent urosepsis, patient will need admission for observation and for IV antibiotics.  Patient and family are understanding and amenable with plan.  Discussed patient with hospitalist who agrees to admit.   I discussed this case with my attending physician Dr. Alvino Chapel who cosigned this note including patient's presenting symptoms, physical exam, and planned diagnostics and interventions. Attending physician stated agreement with plan or made changes to plan which were implemented.     Final Clinical Impression(s) / ED Diagnoses Final diagnoses:  Acute cystitis without hematuria  BV (bacterial vaginosis)    Rx / DC Orders ED Discharge Orders     None         Nestor Lewandowsky 03/13/22 Prudence Davidson, MD 03/13/22 1453

## 2022-03-12 NOTE — ED Provider Notes (Incomplete)
[facility] EMERGENCY DEPARTMENT Provider Note   CSN: [id number] Arrival date & time: 03/12/22  1853     History {Add pertinent medical, surgical, social history, OB history to HPI:1} Chief Complaint  Patient presents with  . Vaginal Discharge    [name] is a 42 y.o. female.  Patient with history of paraplegia, chronic osteomyelitis, hypertension, hyperlipidemia, presents today with complaints of fatigue, weakness, and nausea.   The history is provided by the patient. No language interpreter was used.  Vaginal Discharge      Home Medications Prior to Admission medications   Medication Sig Start Date End Date Taking? Authorizing Provider  acetaminophen (TYLENOL) 500 MG tablet Take 1,000 mg by mouth daily as needed for moderate pain or headache.   Yes [provider]  buPROPion (WELLBUTRIN SR) 150 MG 12 hr tablet Take 1 tablet (150 mg total) by mouth 2 (two) times daily. 01/13/22  Yes Gottschalk, Ashly M, DO  busPIRone (BUSPAR) 15 MG tablet Take 1 tablet (15 mg total) by mouth 2 (two) times daily. 01/13/22  Yes Gottschalk, Leatrice Jewels M, DO  Cholecalciferol (VITAMIN D3) 50 MCG (2000 UT) TABS TAKE 1 TABLET BY MOUTH EVERY DAY 08/22/21  Yes Loman Brooklyn, [name]  citalopram (CELEXA) 40 MG tablet Take 1 tablet (40 mg total) by mouth daily. 11/26/21  Yes [name] F, [name]  [employer] VITAMIN B12 1000 MCG tablet TAKE 1 TABLET BY MOUTH EVERY DAY 11/02/21  Yes Loman Brooklyn, [name]  cyclobenzaprine (FLEXERIL) 5 MG tablet Take 1 tablet (5 mg total) by mouth 3 (three) times daily. 11/26/21  Yes [name] F, [name]  diclofenac Sodium (VOLTAREN) 1 % GEL APPLY 2 GRAMS TO AFFECTED AREA 4 TIMES A DAY Patient taking differently: Apply 2 g topically 4 (four) times daily as needed (pain). 02/21/21  Yes [name] F, [name]  docusate sodium (COLACE) 100 MG capsule Take 1 capsule (100 mg total) by mouth every 12 (twelve) hours as needed for mild constipation. Patient taking differently: Take  100 mg by mouth daily as needed for mild constipation. 01/13/22  Yes Gottschalk, Ashly M, DO  fluticasone (FLONASE) 50 MCG/ACT nasal spray Place 2 sprays into both nostrils daily. 11/26/21  Yes Loman Brooklyn, [name]  gabapentin (NEURONTIN) 600 MG tablet Take 1 tablet (600 mg total) by mouth in the morning, at noon, in the evening, and at bedtime. 01/13/22  Yes Ronnie Doss M, DO  hydrOXYzine (ATARAX) 25 MG tablet Take 1 tablet (25 mg total) by mouth 3 (three) times daily as needed for anxiety. 03/11/22  Yes Gottschalk, Ashly M, DO  Ipratropium-Albuterol (COMBIVENT RESPIMAT) 20-100 MCG/ACT AERS respimat Inhale 1 puff into the lungs every 6 (six) hours as needed. 11/26/21  Yes [name] F, [name]  lacosamide (VIMPAT) 200 MG TABS tablet Take 1 tablet (200 mg total) by mouth 2 (two) times daily. 03/12/22  Yes Gottschalk, Ashly M, DO  levETIRAcetam (KEPPRA) 750 MG tablet Take 2 tablets (1,500 mg total) by mouth 2 (two) times daily. 03/12/22  Yes Gottschalk, Leatrice Jewels M, DO  levocetirizine (XYZAL) 5 MG tablet Take 1 tablet (5 mg total) by mouth every evening. 11/26/21  Yes [name] F, [name]  levothyroxine (SYNTHROID) 88 MCG tablet Take 1 tablet (88 mcg total) by mouth daily before breakfast. 11/26/21  Yes [name] F, [name]  Lidocaine 4 % PTCH Place 1 patch onto the skin daily as needed (pain).   Yes [provider]  magnesium oxide (MAG-OX) 400 (240 Mg) MG  tablet Take 400 mg by mouth daily.   Yes [provider]  nystatin (MYCOSTATIN/NYSTOP) powder Apply 1 application topically 3 (three) times daily. Patient taking differently: Apply 1 application  topically 3 (three) times daily as needed (yeast). 09/12/20  Yes Loman Brooklyn, [name]  omeprazole (PRILOSEC) 20 MG capsule Take 1 capsule (20 mg total) by mouth daily. 11/26/21  Yes [name] F, [name]  oxybutynin (DITROPAN-XL) 5 MG 24 hr tablet Take 1 tablet (5 mg total) by mouth daily. 01/13/22  Yes Gottschalk, Leatrice Jewels M, DO  Oxycodone HCl  10 MG TABS Take 1 tablet (10 mg total) by mouth in the morning, at noon, and at bedtime. 04/06/22  Yes Gottschalk, Leatrice Jewels M, DO  PAIN MANAGEMENT INTRATHECAL, IT, PUMP 1 each by Intrathecal route Continuous EPIDURAL. Intrathecal (IT) medication:  Baclofen   Yes [provider]  Oxycodone HCl 10 MG TABS Take 1 tablet (10 mg total) by mouth in the morning, at noon, and at bedtime. Patient not taking: Reported on 03/12/2022 05/05/22   [name], DO  Oxycodone HCl 10 MG TABS Take 1 tablet (10 mg total) by mouth in the morning, at noon, and at bedtime. Patient not taking: Reported on 03/12/2022 06/04/22   [name], DO  sulfamethoxazole-trimethoprim (BACTRIM) 400-80 MG tablet Take 1 tablet by mouth daily. Patient not taking: Reported on 03/12/2022 01/13/22   [name], DO      Allergies    Macrobid [nitrofurantoin] and Lisinopril    Review of Systems   Review of Systems  Genitourinary:  Positive for vaginal discharge.    Physical Exam Updated Vital Signs BP 129/89 (BP Location: Left Wrist)   Pulse 92   Temp 98.8 F (37.1 C) (Oral)   Resp 18   Ht '5\' 6"'$  (1.676 m)   Wt 122.9 kg   SpO2 95%   BMI 43.74 kg/m  Physical Exam  ED Results / Procedures / Treatments   Labs (all labs ordered are listed, but only abnormal results are displayed) Labs Reviewed  CBC WITH DIFFERENTIAL/PLATELET - Abnormal; Notable for the following components:      Result Value   RBC 3.74 (*)    Hemoglobin 9.6 (*)    HCT 32.3 (*)    MCH 25.7 (*)    MCHC 29.7 (*)    RDW 18.6 (*)    Platelets 453 (*)    All other components within normal limits  COMPREHENSIVE METABOLIC PANEL - Abnormal; Notable for the following components:   Sodium 134 (*)    Glucose, Bld 107 (*)    Calcium 8.6 (*)    Albumin 3.4 (*)    All other components within normal limits  URINALYSIS, ROUTINE W REFLEX MICROSCOPIC - Abnormal; Notable for the following components:   APPearance HAZY (*)    Nitrite POSITIVE  (*)    Leukocytes,Ua LARGE (*)    Bacteria, UA MANY (*)    All other components within normal limits  URINE CULTURE  WET PREP, GENITAL  LACTIC ACID, PLASMA  LACTIC ACID, PLASMA    EKG None  Radiology CT ABDOMEN PELVIS W CONTRAST  Result Date: 03/12/2022 CLINICAL DATA:  Abdominal pain, acute, nonlocalized. EXAM: CT ABDOMEN AND PELVIS WITH CONTRAST TECHNIQUE: Multidetector CT imaging of the abdomen and pelvis was performed using the standard protocol following bolus administration of intravenous contrast. RADIATION DOSE REDUCTION: This exam was performed according to the departmental dose-optimization program which includes automated exposure control, adjustment of the mA and/or kV according  to patient size and/or use of iterative reconstruction technique. CONTRAST:  162m OMNIPAQUE IOHEXOL 300 MG/ML  SOLN COMPARISON:  CT examination dated December 18, 2021 FINDINGS: Lower chest: No acute abnormality. Hepatobiliary: No focal liver abnormality is seen. Status post cholecystectomy. No biliary dilatation. Pancreas: Unremarkable. No pancreatic ductal dilatation or surrounding inflammatory changes. Spleen: Normal in size without focal abnormality. Adrenals/Urinary Tract: Adrenal glands are unremarkable. There is chronic prominence of the left collecting system without evidence of hydronephrosis or ureteral calculus. 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 Postsurgical changes for prior cystectomy. Stomach/Bowel: Stomach is within normal limits. Appendix not identified. No evidence of bowel wall thickening, distention, or inflammatory changes. Large amount of retained colonic stool suggesting constipation Vascular/Lymphatic: No significant vascular findings are present. No enlarged abdominal or pelvic lymph nodes. Reproductive: Status post hysterectomy. No adnexal masses. Other: No abdominal wall hernia or  abnormality. No abdominopelvic ascites. Skin thickening and subcutaneous soft tissue edema about the bilateral ischial tuberosity and about the lower sacrum concerning for decubitus ulcers. Musculoskeletal: Chronic compression deformity of L1 vertebral body with posterior spinal fusion from T11-L3. No acute osseous abnormality. Postsurgical changes about the lower sacrum/coccyx. IMPRESSION: 1. No CT evidence of acute abdominal/pelvic process. 2. Large amount of retained colonic stool suggesting constipation. 3. 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 4. Chronic compression deformity of L1 vertebral body with posterior spinal fusion from T11-L3. Postsurgical changes about the lower sacrum/coccyx. 5. Skin thickening and soft tissue edema about the bilateral ischial tuberosities as well as sacrum concerning for decubitus ulcers. Electronically Signed   By: IKeane PoliceD.O.   On: 03/12/2022 22:47   DG Chest Portable 1 View  Result Date: 03/12/2022 CLINICAL DATA:  Cough and vaginal discharge. EXAM: PORTABLE CHEST 1 VIEW COMPARISON:  December 18, 2021 FINDINGS: The heart size and mediastinal contours are within normal limits. Both lungs are clear. Postoperative changes seen within the lower thoracic spine. IMPRESSION: No active disease. Electronically Signed   By: TVirgina NorfolkM.D.   On: 03/12/2022 22:45    Procedures Procedures  {Document cardiac monitor, telemetry assessment procedure when appropriate:1}  Medications Ordered in ED Medications  morphine (PF) 4 MG/ML injection 4 mg (4 mg Intravenous Given 03/12/22 2211)  iohexol (OMNIPAQUE) 300 MG/ML solution 100 mL (100 mLs Intravenous Contrast Given 03/12/22 2216)    ED Course/ Medical Decision Making/ A&P                           Medical Decision Making Amount and/or Complexity of Data Reviewed Labs: ordered. Radiology: ordered.  Risk Prescription  drug management.   ***  {Document critical care time when appropriate:1} {Document review of labs and clinical decision tools ie heart score, Chads2Vasc2 etc:1}  {Document your independent review of radiology images, and any outside records:1} {Document your discussion with family members, caretakers, and with consultants:1} {Document social determinants of health affecting pt's care:1} {Document your decision making why or why not admission, treatments were needed:1} Final Clinical Impression(s) / ED Diagnoses Final diagnoses:  None    Rx / DC Orders ED Discharge Orders     None

## 2022-03-12 NOTE — Telephone Encounter (Signed)
Mom aware and verbalizes understanding. Will go to ER.

## 2022-03-12 NOTE — ED Triage Notes (Signed)
Pt BIB in from home by [facility] for vaginal discharge and stoma infection, pt is bed-bound at baseline v/s en route: 119/72, P97, RR16, O2 96, temp 97.2

## 2022-03-12 NOTE — ED Notes (Signed)
Patient transported to CT 

## 2022-03-13 DIAGNOSIS — D75839 Thrombocytosis, unspecified: Secondary | ICD-10-CM | POA: Diagnosis not present

## 2022-03-13 DIAGNOSIS — E8809 Other disorders of plasma-protein metabolism, not elsewhere classified: Secondary | ICD-10-CM | POA: Diagnosis present

## 2022-03-13 DIAGNOSIS — T83598A Infection and inflammatory reaction due to other prosthetic device, implant and graft in urinary system, initial encounter: Secondary | ICD-10-CM | POA: Diagnosis not present

## 2022-03-13 DIAGNOSIS — F419 Anxiety disorder, unspecified: Secondary | ICD-10-CM | POA: Insufficient documentation

## 2022-03-13 DIAGNOSIS — N760 Acute vaginitis: Secondary | ICD-10-CM | POA: Diagnosis not present

## 2022-03-13 DIAGNOSIS — I10 Essential (primary) hypertension: Secondary | ICD-10-CM | POA: Diagnosis present

## 2022-03-13 DIAGNOSIS — E039 Hypothyroidism, unspecified: Secondary | ICD-10-CM | POA: Diagnosis not present

## 2022-03-13 DIAGNOSIS — K219 Gastro-esophageal reflux disease without esophagitis: Secondary | ICD-10-CM | POA: Diagnosis present

## 2022-03-13 DIAGNOSIS — G894 Chronic pain syndrome: Secondary | ICD-10-CM | POA: Diagnosis present

## 2022-03-13 DIAGNOSIS — Z87891 Personal history of nicotine dependence: Secondary | ICD-10-CM | POA: Diagnosis not present

## 2022-03-13 DIAGNOSIS — E876 Hypokalemia: Secondary | ICD-10-CM | POA: Diagnosis present

## 2022-03-13 DIAGNOSIS — F339 Major depressive disorder, recurrent, unspecified: Secondary | ICD-10-CM | POA: Diagnosis present

## 2022-03-13 DIAGNOSIS — N319 Neuromuscular dysfunction of bladder, unspecified: Secondary | ICD-10-CM

## 2022-03-13 DIAGNOSIS — E441 Mild protein-calorie malnutrition: Secondary | ICD-10-CM | POA: Diagnosis present

## 2022-03-13 DIAGNOSIS — L89154 Pressure ulcer of sacral region, stage 4: Secondary | ICD-10-CM | POA: Diagnosis present

## 2022-03-13 DIAGNOSIS — Z6841 Body Mass Index (BMI) 40.0 and over, adult: Secondary | ICD-10-CM | POA: Diagnosis not present

## 2022-03-13 DIAGNOSIS — Z8249 Family history of ischemic heart disease and other diseases of the circulatory system: Secondary | ICD-10-CM | POA: Diagnosis not present

## 2022-03-13 DIAGNOSIS — Z936 Other artificial openings of urinary tract status: Secondary | ICD-10-CM | POA: Diagnosis not present

## 2022-03-13 DIAGNOSIS — E785 Hyperlipidemia, unspecified: Secondary | ICD-10-CM | POA: Diagnosis present

## 2022-03-13 DIAGNOSIS — B9689 Other specified bacterial agents as the cause of diseases classified elsewhere: Secondary | ICD-10-CM

## 2022-03-13 DIAGNOSIS — Y846 Urinary catheterization as the cause of abnormal reaction of the patient, or of later complication, without mention of misadventure at the time of the procedure: Secondary | ICD-10-CM | POA: Diagnosis present

## 2022-03-13 DIAGNOSIS — R569 Unspecified convulsions: Secondary | ICD-10-CM | POA: Diagnosis present

## 2022-03-13 DIAGNOSIS — B952 Enterococcus as the cause of diseases classified elsewhere: Secondary | ICD-10-CM | POA: Diagnosis present

## 2022-03-13 DIAGNOSIS — N3000 Acute cystitis without hematuria: Secondary | ICD-10-CM | POA: Diagnosis not present

## 2022-03-13 DIAGNOSIS — D649 Anemia, unspecified: Secondary | ICD-10-CM | POA: Diagnosis present

## 2022-03-13 DIAGNOSIS — Z1624 Resistance to multiple antibiotics: Secondary | ICD-10-CM | POA: Diagnosis present

## 2022-03-13 DIAGNOSIS — E871 Hypo-osmolality and hyponatremia: Secondary | ICD-10-CM | POA: Diagnosis present

## 2022-03-13 DIAGNOSIS — G8220 Paraplegia, unspecified: Secondary | ICD-10-CM | POA: Diagnosis present

## 2022-03-13 HISTORY — DX: Other specified bacterial agents as the cause of diseases classified elsewhere: B96.89

## 2022-03-13 LAB — SODIUM, URINE, RANDOM: Sodium, Ur: 27 mmol/L

## 2022-03-13 LAB — CBC
HCT: 31.7 % — ABNORMAL LOW (ref 36.0–46.0)
Hemoglobin: 9.4 g/dL — ABNORMAL LOW (ref 12.0–15.0)
MCH: 25.8 pg — ABNORMAL LOW (ref 26.0–34.0)
MCHC: 29.7 g/dL — ABNORMAL LOW (ref 30.0–36.0)
MCV: 87.1 fL (ref 80.0–100.0)
Platelets: 435 10*3/uL — ABNORMAL HIGH (ref 150–400)
RBC: 3.64 MIL/uL — ABNORMAL LOW (ref 3.87–5.11)
RDW: 18.6 % — ABNORMAL HIGH (ref 11.5–15.5)
WBC: 9.6 10*3/uL (ref 4.0–10.5)
nRBC: 0 % (ref 0.0–0.2)

## 2022-03-13 LAB — COMPREHENSIVE METABOLIC PANEL
ALT: 16 U/L (ref 0–44)
AST: 17 U/L (ref 15–41)
Albumin: 3.3 g/dL — ABNORMAL LOW (ref 3.5–5.0)
Alkaline Phosphatase: 105 U/L (ref 38–126)
Anion gap: 6 (ref 5–15)
BUN: 14 mg/dL (ref 6–20)
CO2: 22 mmol/L (ref 22–32)
Calcium: 8.1 mg/dL — ABNORMAL LOW (ref 8.9–10.3)
Chloride: 102 mmol/L (ref 98–111)
Creatinine, Ser: 0.61 mg/dL (ref 0.44–1.00)
GFR, Estimated: >60 mL/min (ref >60)
Glucose, Bld: 139 mg/dL — ABNORMAL HIGH (ref 70–99)
Potassium: 3.5 mmol/L (ref 3.5–5.1)
Sodium: 130 mmol/L — ABNORMAL LOW (ref 135–145)
Total Bilirubin: 0.4 mg/dL (ref 0.3–1.2)
Total Protein: 7.2 g/dL (ref 6.5–8.1)

## 2022-03-13 LAB — TSH: TSH: 4.667 u[IU]/mL — ABNORMAL HIGH (ref 0.350–4.500)

## 2022-03-13 LAB — OSMOLALITY, URINE: Osmolality, Ur: 255 mOsm/kg — ABNORMAL LOW (ref 300–900)

## 2022-03-13 LAB — MAGNESIUM: Magnesium: 1.8 mg/dL (ref 1.7–2.4)

## 2022-03-13 LAB — OSMOLALITY: Osmolality: 286 mOsm/kg (ref 275–295)

## 2022-03-13 LAB — PHOSPHORUS: Phosphorus: 3 mg/dL (ref 2.5–4.6)

## 2022-03-13 MED ORDER — SODIUM CHLORIDE 0.9 % IV SOLN
1.0000 g | Freq: Three times a day (TID) | INTRAVENOUS | Status: DC
  Administered 2022-03-13 – 2022-03-15 (×7): 1 g via INTRAVENOUS
  Filled 2022-03-13 (×8): qty 20

## 2022-03-13 MED ORDER — SODIUM CHLORIDE 0.9 % IV SOLN: Freq: Once | INTRAVENOUS | Status: AC

## 2022-03-13 MED ORDER — GABAPENTIN 300 MG PO CAPS
600.0000 mg | ORAL_CAPSULE | Freq: Four times a day (QID) | ORAL | Status: DC
  Administered 2022-03-13 – 2022-03-16 (×12): 600 mg via ORAL
  Filled 2022-03-13 (×13): qty 2

## 2022-03-13 MED ORDER — HYDROXYZINE HCL 25 MG PO TABS
25.0000 mg | ORAL_TABLET | Freq: Three times a day (TID) | ORAL | Status: DC | PRN
  Administered 2022-03-13 – 2022-03-15 (×5): 25 mg via ORAL
  Filled 2022-03-13 (×5): qty 1

## 2022-03-13 MED ORDER — MEDIHONEY WOUND/BURN DRESSING EX PSTE
1.0000 | PASTE | Freq: Every day | CUTANEOUS | Status: DC
  Administered 2022-03-13: 1 via TOPICAL
  Filled 2022-03-13: qty 44

## 2022-03-13 MED ORDER — LEVOTHYROXINE SODIUM 88 MCG PO TABS: 88.0000 ug | ORAL_TABLET | Freq: Every day | ORAL | Status: DC

## 2022-03-13 MED ORDER — ENOXAPARIN SODIUM 40 MG/0.4ML IJ SOSY
40.0000 mg | PREFILLED_SYRINGE | INTRAMUSCULAR | Status: DC
  Administered 2022-03-13 – 2022-03-14 (×2): 40 mg via SUBCUTANEOUS
  Filled 2022-03-13 (×2): qty 0.4

## 2022-03-13 MED ORDER — BUSPIRONE HCL 5 MG PO TABS
15.0000 mg | ORAL_TABLET | Freq: Two times a day (BID) | ORAL | Status: DC
  Administered 2022-03-13 – 2022-03-16 (×8): 15 mg via ORAL
  Filled 2022-03-13 (×8): qty 3

## 2022-03-13 MED ORDER — ONDANSETRON HCL 4 MG/2ML IJ SOLN: 4.0000 mg | Freq: Four times a day (QID) | INTRAMUSCULAR | Status: DC | PRN

## 2022-03-13 MED ORDER — LEVOTHYROXINE SODIUM 88 MCG PO TABS
88.0000 ug | ORAL_TABLET | Freq: Every day | ORAL | Status: DC
  Administered 2022-03-13 – 2022-03-16 (×4): 88 ug via ORAL
  Filled 2022-03-13 (×4): qty 1

## 2022-03-13 MED ORDER — PANTOPRAZOLE SODIUM 40 MG PO TBEC
40.0000 mg | DELAYED_RELEASE_TABLET | Freq: Every day | ORAL | Status: DC
  Administered 2022-03-13 – 2022-03-16 (×4): 40 mg via ORAL
  Filled 2022-03-13 (×4): qty 1

## 2022-03-13 MED ORDER — ENSURE ENLIVE PO LIQD
237.0000 mL | Freq: Two times a day (BID) | ORAL | Status: DC
  Administered 2022-03-13 – 2022-03-16 (×5): 237 mL via ORAL
  Filled 2022-03-13 (×6): qty 237

## 2022-03-13 MED ORDER — OXYBUTYNIN CHLORIDE ER 5 MG PO TB24
5.0000 mg | ORAL_TABLET | Freq: Every day | ORAL | Status: DC
  Administered 2022-03-13 – 2022-03-16 (×4): 5 mg via ORAL
  Filled 2022-03-13 (×4): qty 1

## 2022-03-13 MED ORDER — OXYCODONE HCL 5 MG PO TABS
10.0000 mg | ORAL_TABLET | Freq: Three times a day (TID) | ORAL | Status: DC | PRN
  Administered 2022-03-13 – 2022-03-16 (×11): 10 mg via ORAL
  Filled 2022-03-13 (×11): qty 2

## 2022-03-13 MED ORDER — ACETAMINOPHEN 325 MG PO TABS
650.0000 mg | ORAL_TABLET | Freq: Four times a day (QID) | ORAL | Status: DC | PRN
  Administered 2022-03-15: 650 mg via ORAL
  Filled 2022-03-13: qty 2

## 2022-03-13 MED ORDER — LEVETIRACETAM 500 MG PO TABS
1500.0000 mg | ORAL_TABLET | Freq: Two times a day (BID) | ORAL | Status: DC
  Administered 2022-03-13 – 2022-03-16 (×7): 1500 mg via ORAL
  Filled 2022-03-13 (×7): qty 3

## 2022-03-13 MED ORDER — ONDANSETRON HCL 4 MG PO TABS: 4.0000 mg | ORAL_TABLET | Freq: Four times a day (QID) | ORAL | Status: DC | PRN

## 2022-03-13 MED ORDER — LACOSAMIDE 50 MG PO TABS
200.0000 mg | ORAL_TABLET | Freq: Two times a day (BID) | ORAL | Status: DC
  Administered 2022-03-13 – 2022-03-16 (×8): 200 mg via ORAL
  Filled 2022-03-13 (×8): qty 4

## 2022-03-13 MED ORDER — METRONIDAZOLE 500 MG PO TABS
500.0000 mg | ORAL_TABLET | Freq: Two times a day (BID) | ORAL | Status: DC
  Administered 2022-03-13 – 2022-03-16 (×7): 500 mg via ORAL
  Filled 2022-03-13 (×7): qty 1

## 2022-03-13 MED ORDER — ACETAMINOPHEN 650 MG RE SUPP: 650.0000 mg | Freq: Four times a day (QID) | RECTAL | Status: DC | PRN

## 2022-03-13 MED ORDER — BUPROPION HCL ER (SR) 150 MG PO TB12
150.0000 mg | ORAL_TABLET | Freq: Two times a day (BID) | ORAL | Status: DC
  Administered 2022-03-13 – 2022-03-16 (×8): 150 mg via ORAL
  Filled 2022-03-13 (×8): qty 1

## 2022-03-13 MED ORDER — CYCLOBENZAPRINE HCL 10 MG PO TABS
5.0000 mg | ORAL_TABLET | Freq: Three times a day (TID) | ORAL | Status: DC
  Administered 2022-03-13 – 2022-03-16 (×10): 5 mg via ORAL
  Filled 2022-03-13 (×10): qty 1

## 2022-03-13 MED ORDER — CITALOPRAM HYDROBROMIDE 20 MG PO TABS
40.0000 mg | ORAL_TABLET | Freq: Every day | ORAL | Status: DC
  Administered 2022-03-13 – 2022-03-16 (×4): 40 mg via ORAL
  Filled 2022-03-13 (×4): qty 2

## 2022-03-13 MED ORDER — IPRATROPIUM-ALBUTEROL 20-100 MCG/ACT IN AERS: 1.0000 | INHALATION_SPRAY | Freq: Four times a day (QID) | RESPIRATORY_TRACT | Status: DC | PRN

## 2022-03-13 MED ORDER — SODIUM CHLORIDE 0.9 % IV SOLN: INTRAVENOUS | Status: AC

## 2022-03-13 NOTE — ED Notes (Addendum)
Pt uses vashe wound wash for PI on sacrum  Last suprapubic catheter bag change was on 03/11/2022. Pt's mother states that she changes it 2xweek unless otherwise needed. States to "burp" the bag so does not leak

## 2022-03-13 NOTE — ED Notes (Signed)
Patient requesting pain medicine and anxiety medicine. RN notified.
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
## 2022-03-13 NOTE — Progress Notes (Signed)
Pharmacy Antibiotic Note  [name] is a 42 y.o. female with h/o MDR E. Coli UTI admitted on 03/12/2022 with UTI.  Pharmacy has been consulted for Meropenem dosing.  Plan: Meropenem 1 g IV q8h  Height: '5\' 6"'$  (167.6 cm) Weight: 122.9 kg (271 lb) IBW/kg (Calculated) : 59.3  Temp (24hrs), Avg:98.6 F (37 C), Min:98.4 F (36.9 C), Max:98.8 F (37.1 C)  Recent Labs  Lab 03/11/22 1528 03/12/22 2042 03/12/22 2232 03/13/22 0312  WBC 12.4* 10.3  --  9.6  CREATININE 0.64 0.59  --   --   LATICACIDVEN  --  1.1 1.2  --     Estimated Creatinine Clearance: 125 mL/min (by C-G formula based on SCr of 0.59 mg/dL).    Allergies  Allergen Reactions   Macrobid [Nitrofurantoin]     Not effective for patient. "Lung issues"   Lisinopril Cough     [name] 03/13/2022 3:29 AM

## 2022-03-13 NOTE — Consult Note (Addendum)
WOC Nurse Consult Note: Remote consult completed via review of the electronic medical record.   Bedside RN to obtain measurements of open wounds at skin assessment and chart in flowsheet. Her observation status and presentation to the ED are not due to these chronic, open areas.  Reason for Consult: Chronic pressure injuries in the setting of paraplegia.  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  Now has had UTI twice since then.  Presents with Bacterial vaginosis, heavy vaginal discharge and pain. Managed by medical team.  Wound type:stage 4 pressure injury to coccyx, chronic nonhealing.  Chronic osteomyelitis on left ischial tuberosity, unstageable  Bilateral heel pressure injuries, wears offloading heel protectors at home.  Pressure Injury POA: Yes Measurement:bedside RN to obtain at skin assessment and chart in flowsheets.  Wound bed: Unable to visualize coccyx wound bed. Has punched out appearance.  Devitalized tissue to heel wounds and left ischial wound Drainage (amount, consistency, odor) to be assessed and documented in flowsheet by bedside RN completing skin assessment.  Periwound: Dressing procedure/placement/frequency: Cleanse wound to sacrum with NS and pat dry. Fill defect with aquacel (LAWSON # [id number]) and cover with sacral foam. Change every three days and PRN soilage Cleanse left ischial wound and bilateral heel wounds with NS and pat dry. Apply medihoney to wound bed, cover with gauze and silicone foam.  Peel back foam and reapply daily and replace foam every three days and PRN soilage.  Prevalon boots to heels Mattress with low air loss feature (Please order now if patient will be in ED for extended period)  [place] Nurse ostomy follow up Patient may benefit from referral to outpatient ostomy clinic.  If you agree, please order referral to clinic.  Stoma type/location: RLQ ileal conduit, placed two months ago.  Stomal assessment/size:  pink and moist   Peristomal assessment: not assessed Treatment options for stomal/peristomal skin: barrier ring (LAWSON # [id number])  and 1piece urostomy pouch (LAWSON # [id number])  Output cloudy urine, malodorous Ostomy pouching: 1pc. Convex with barrier ring Education provided: none   Will not follow at this time.  Please re-consult if needed.  Estrellita Ludwig MSN, RN, [name]-BC CWON Wound, Ostomy, Continence Nurse [facility] [phone] Pager [phone]
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
## 2022-03-13 NOTE — Progress Notes (Signed)
  Transition of Care (TOC) Screening Note   Patient Details  Name: [name] Date of Birth: [birth year]   Transition of Care [facility]) CM/SW Contact:    Iona Beard, [place] Phone Number: 03/13/2022, 11:11 AM    Transition of Care Department [facility]) has reviewed patient and no TOC needs have been identified at this time. We will continue to monitor patient advancement through interdisciplinary progression rounds. If new patient transition needs arise, please place a TOC consult.

## 2022-03-13 NOTE — ED Notes (Signed)
Gave report to [facility]

## 2022-03-14 ENCOUNTER — Telehealth: Payer: Self-pay | Admitting: Family Medicine

## 2022-03-14 DIAGNOSIS — B9689 Other specified bacterial agents as the cause of diseases classified elsewhere: Secondary | ICD-10-CM | POA: Diagnosis not present

## 2022-03-14 DIAGNOSIS — N760 Acute vaginitis: Secondary | ICD-10-CM | POA: Diagnosis not present

## 2022-03-14 LAB — BASIC METABOLIC PANEL
Anion gap: 7 (ref 5–15)
BUN: 12 mg/dL (ref 6–20)
CO2: 26 mmol/L (ref 22–32)
Calcium: 8 mg/dL — ABNORMAL LOW (ref 8.9–10.3)
Chloride: 106 mmol/L (ref 98–111)
Creatinine, Ser: 0.58 mg/dL (ref 0.44–1.00)
GFR, Estimated: >60 mL/min (ref >60)
Glucose, Bld: 93 mg/dL (ref 70–99)
Potassium: 3.6 mmol/L (ref 3.5–5.1)
Sodium: 139 mmol/L (ref 135–145)

## 2022-03-14 LAB — CBC
HCT: 29.7 % — ABNORMAL LOW (ref 36.0–46.0)
Hemoglobin: 8.8 g/dL — ABNORMAL LOW (ref 12.0–15.0)
MCH: 25.9 pg — ABNORMAL LOW (ref 26.0–34.0)
MCHC: 29.6 g/dL — ABNORMAL LOW (ref 30.0–36.0)
MCV: 87.4 fL (ref 80.0–100.0)
Platelets: 401 10*3/uL — ABNORMAL HIGH (ref 150–400)
RBC: 3.4 MIL/uL — ABNORMAL LOW (ref 3.87–5.11)
RDW: 18.8 % — ABNORMAL HIGH (ref 11.5–15.5)
WBC: 8.3 10*3/uL (ref 4.0–10.5)
nRBC: 0 % (ref 0.0–0.2)

## 2022-03-14 LAB — RETICULOCYTES
Immature Retic Fract: 18 % — ABNORMAL HIGH (ref 2.3–15.9)
RBC.: 3.5 MIL/uL — ABNORMAL LOW (ref 3.87–5.11)
Retic Count, Absolute: 55.7 10*3/uL (ref 19.0–186.0)
Retic Ct Pct: 1.6 % (ref 0.4–3.1)

## 2022-03-14 LAB — IRON AND TIBC
Iron: 38 ug/dL (ref 28–170)
Saturation Ratios: 15 % (ref 10.4–31.8)
TIBC: 250 ug/dL (ref 250–450)
UIBC: 212 ug/dL

## 2022-03-14 LAB — URINE CULTURE

## 2022-03-14 LAB — FOLATE: Folate: 5.6 ng/mL — ABNORMAL LOW (ref >5.9)

## 2022-03-14 LAB — MAGNESIUM: Magnesium: 2.1 mg/dL (ref 1.7–2.4)

## 2022-03-14 LAB — T4, FREE: Free T4: 0.77 ng/dL (ref 0.61–1.12)

## 2022-03-14 LAB — FERRITIN: Ferritin: 510 ng/mL — ABNORMAL HIGH (ref 11–307)

## 2022-03-14 LAB — VITAMIN B12: Vitamin B-12: 509 pg/mL (ref 180–914)

## 2022-03-14 MED ORDER — ENOXAPARIN SODIUM 60 MG/0.6ML IJ SOSY
60.0000 mg | PREFILLED_SYRINGE | INTRAMUSCULAR | Status: DC
  Administered 2022-03-15 – 2022-03-16 (×2): 60 mg via SUBCUTANEOUS
  Filled 2022-03-14 (×2): qty 0.6

## 2022-03-14 NOTE — Telephone Encounter (Signed)
Mother calling in about pt being at the [facility] hospital and not getting the IV abx. Mother says that pt will be released with one oral abx and mother says that she told the dr who is treating pt at [facility] hospital that pt needs to be on a continuous abx. Mother says that the hospital dr told her that pcp or urologist would have to prescribe and mother does not want pt to come home without the continuous abx. Please call back

## 2022-03-14 NOTE — Telephone Encounter (Signed)
[name] states you told her she cannot be on oral abx that it has to be iv abx. They are sending her home with oral and mom wants your recommendations since you told her she needs iv abx and this is not being given

## 2022-03-14 NOTE — Plan of Care (Signed)

## 2022-03-14 NOTE — Progress Notes (Signed)
Notified Dr. Manuella Ghazi of patient's cough.

## 2022-03-14 NOTE — Telephone Encounter (Signed)
Na

## 2022-03-14 NOTE — Telephone Encounter (Signed)
Spoke to [employer].  If the hospitalist feels IV abx/ PICC line is appropriate.  They will most certainly make sure that is arranged.  She is super complex and def needs to see ID outpatient.  Explained there may NOT be an oral abx out there that she can use for prevention of recurrent UTIs given the multidrug resistant bacteria.  This may be a reason to consider seeing Palliative as recommended during her previous hospitalization.
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
## 2022-03-14 NOTE — Progress Notes (Signed)
Patient wanted nursing to tell Dr. Manuella Ghazi that she was still having headaches and shakes. Notified Dr. Manuella Ghazi of this. Patient called out again, with 10-20 mins to state she was still shaking. Notified Dr. Manuella Ghazi of this. Patient assessed but assessment has not changed from previous.

## 2022-03-14 NOTE — Telephone Encounter (Signed)
Again, given her EXTREMELY complex urinary history, this decision really needs to come from urology/ infectious disease.  Her urine has been MULTIdrug resistant so I'd have no clue what to put her on for prevention.  Clearly her previous antibiotic was not helpful.

## 2022-03-14 NOTE — TOC Progression Note (Signed)
Transition of Care [facility]) - Progression Note    Patient Details  Name: MALU PELLEGRINI MRN: [id number] Date of Birth: 1980-10-22  Transition of Care [facility]) CM/SW Contact  Shade Flood, LCSW Phone Number: 03/14/2022, 10:42 AM  Clinical Narrative:     TOC following. Per MD, pt may be ready for dc home tomorrow. MD states pt may need IV anbx at dc. Updated [facility] and she will follow along to assist if needed.    Barriers to Discharge: Continued Medical Work up  Expected Discharge Plan and Services                                               Social Determinants of Health (SDOH) Interventions [place]: Unknown (03/13/2022)  Housing: Low Risk  (03/13/2022)  Transportation Needs: No Transportation Needs (03/13/2022)  Utilities: Not At Risk (03/13/2022)  Depression (PHQ2-9): High Risk (01/13/2022)  Tobacco Use: Medium Risk (03/12/2022)    Readmission Risk Interventions    06/04/2020    3:43 PM 01/13/2020    1:02 PM 01/10/2020   12:41 PM  Readmission Risk Prevention Plan  Transportation Screening Complete  Complete  PCP or Specialist Appt within 3-5 Days Complete    HRI or [place] Complete    Social Work Consult for [place] Planning/Counseling Complete    Palliative Care Screening Not Applicable    Medication Review Press photographer) Complete  Complete  PCP or Specialist appointment within 3-5 days of discharge  Complete   HRI or [place]   Complete  SW Recovery Care/Counseling Consult   Complete  [place]   Not Applicable

## 2022-03-14 NOTE — Progress Notes (Signed)
Patient mother called to speak about patient going home on oral antibiotics, and patient needed preventative meds for UTI. She states Dr. Manuella Ghazi had told her that her primary urologist would need to prescribe that. Wants Dr. Manuella Ghazi to call her back regarding going home on IV antibiotics via PICC line, have messaged him regarding questions. Patient currently resting with eyes closed, resp even and non labored. No shaking noted nor cough.

## 2022-03-14 NOTE — Progress Notes (Signed)
Patient requested her pain medication. Explained if I gave it now it would be her last dose for the day as she had a dose early this morning prior to my shift. She expressed understanding. Explained it would be after midnight tonight before she could have another pill. Expressed understanding. Provided medication as ordered.

## 2022-03-15 DIAGNOSIS — B9689 Other specified bacterial agents as the cause of diseases classified elsewhere: Secondary | ICD-10-CM | POA: Diagnosis not present

## 2022-03-15 DIAGNOSIS — N760 Acute vaginitis: Secondary | ICD-10-CM | POA: Diagnosis not present

## 2022-03-15 LAB — CBC
HCT: 31.3 % — ABNORMAL LOW (ref 36.0–46.0)
Hemoglobin: 9.2 g/dL — ABNORMAL LOW (ref 12.0–15.0)
MCH: 25.6 pg — ABNORMAL LOW (ref 26.0–34.0)
MCHC: 29.4 g/dL — ABNORMAL LOW (ref 30.0–36.0)
MCV: 87.2 fL (ref 80.0–100.0)
Platelets: 418 10*3/uL — ABNORMAL HIGH (ref 150–400)
RBC: 3.59 MIL/uL — ABNORMAL LOW (ref 3.87–5.11)
RDW: 18.4 % — ABNORMAL HIGH (ref 11.5–15.5)
WBC: 7.9 10*3/uL (ref 4.0–10.5)
nRBC: 0 % (ref 0.0–0.2)

## 2022-03-15 LAB — URINE CULTURE: Culture: >=100000 — AB

## 2022-03-15 LAB — BASIC METABOLIC PANEL WITH GFR
Anion gap: 9 (ref 5–15)
BUN: 9 mg/dL (ref 6–20)
CO2: 25 mmol/L (ref 22–32)
Calcium: 8.3 mg/dL — ABNORMAL LOW (ref 8.9–10.3)
Chloride: 106 mmol/L (ref 98–111)
Creatinine, Ser: 0.57 mg/dL (ref 0.44–1.00)
GFR, Estimated: >60 mL/min
Glucose, Bld: 100 mg/dL — ABNORMAL HIGH (ref 70–99)
Potassium: 3.4 mmol/L — ABNORMAL LOW (ref 3.5–5.1)
Sodium: 140 mmol/L (ref 135–145)

## 2022-03-15 LAB — MAGNESIUM: Magnesium: 2 mg/dL (ref 1.7–2.4)

## 2022-03-15 MED ORDER — MECLIZINE HCL 12.5 MG PO TABS
12.5000 mg | ORAL_TABLET | Freq: Two times a day (BID) | ORAL | Status: DC
  Administered 2022-03-15 – 2022-03-16 (×3): 12.5 mg via ORAL
  Filled 2022-03-15 (×3): qty 1

## 2022-03-15 MED ORDER — POTASSIUM CHLORIDE CRYS ER 20 MEQ PO TBCR
40.0000 meq | EXTENDED_RELEASE_TABLET | Freq: Once | ORAL | Status: AC
  Administered 2022-03-15: 40 meq via ORAL
  Filled 2022-03-15: qty 2

## 2022-03-15 MED ORDER — SULFAMETHOXAZOLE-TRIMETHOPRIM 800-160 MG PO TABS
1.0000 | ORAL_TABLET | Freq: Two times a day (BID) | ORAL | Status: DC
  Administered 2022-03-15 – 2022-03-16 (×3): 1 via ORAL
  Filled 2022-03-15 (×3): qty 1
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
## 2022-03-15 NOTE — Progress Notes (Signed)
Patient urostomy leaking. Have notified AC of needs of supplies.

## 2022-03-15 NOTE — Progress Notes (Signed)
Urostomy bag changed, but unfortunately we do not have any stoma paste, so may leak again.

## 2022-03-15 NOTE — Progress Notes (Signed)
Patient repositioned, able to set up her tray with minimal assist. Took Ensure Vanilla this evening.

## 2022-03-16 DIAGNOSIS — N760 Acute vaginitis: Secondary | ICD-10-CM | POA: Diagnosis not present

## 2022-03-16 DIAGNOSIS — B9689 Other specified bacterial agents as the cause of diseases classified elsewhere: Secondary | ICD-10-CM | POA: Diagnosis not present

## 2022-03-16 LAB — BASIC METABOLIC PANEL
Anion gap: 10 (ref 5–15)
BUN: 11 mg/dL (ref 6–20)
CO2: 26 mmol/L (ref 22–32)
Calcium: 8.9 mg/dL (ref 8.9–10.3)
Chloride: 103 mmol/L (ref 98–111)
Creatinine, Ser: 0.62 mg/dL (ref 0.44–1.00)
GFR, Estimated: >60 mL/min (ref >60)
Glucose, Bld: 97 mg/dL (ref 70–99)
Potassium: 3.9 mmol/L (ref 3.5–5.1)
Sodium: 139 mmol/L (ref 135–145)

## 2022-03-16 LAB — CBC
HCT: 34.6 % — ABNORMAL LOW (ref 36.0–46.0)
Hemoglobin: 10.3 g/dL — ABNORMAL LOW (ref 12.0–15.0)
MCH: 25.7 pg — ABNORMAL LOW (ref 26.0–34.0)
MCHC: 29.8 g/dL — ABNORMAL LOW (ref 30.0–36.0)
MCV: 86.3 fL (ref 80.0–100.0)
Platelets: 444 10*3/uL — ABNORMAL HIGH (ref 150–400)
RBC: 4.01 MIL/uL (ref 3.87–5.11)
RDW: 18.6 % — ABNORMAL HIGH (ref 11.5–15.5)
WBC: 9.5 10*3/uL (ref 4.0–10.5)
nRBC: 0 % (ref 0.0–0.2)

## 2022-03-16 LAB — MAGNESIUM: Magnesium: 2.1 mg/dL (ref 1.7–2.4)

## 2022-03-16 MED ORDER — SULFAMETHOXAZOLE-TRIMETHOPRIM 400-80 MG PO TABS: 1.0000 | ORAL_TABLET | Freq: Every day | ORAL | 0 refills | Status: DC

## 2022-03-16 MED ORDER — ENSURE ENLIVE PO LIQD: 237.0000 mL | Freq: Two times a day (BID) | ORAL | 12 refills | Status: AC

## 2022-03-16 MED ORDER — METRONIDAZOLE 500 MG PO TABS: 500.0000 mg | ORAL_TABLET | Freq: Two times a day (BID) | ORAL | 0 refills | Status: AC

## 2022-03-16 MED ORDER — SULFAMETHOXAZOLE-TRIMETHOPRIM 800-160 MG PO TABS: 1.0000 | ORAL_TABLET | Freq: Two times a day (BID) | ORAL | 0 refills | Status: AC

## 2022-03-16 MED ORDER — MECLIZINE HCL 12.5 MG PO TABS: 12.5000 mg | ORAL_TABLET | Freq: Three times a day (TID) | ORAL | 2 refills | Status: AC | PRN

## 2022-03-16 NOTE — TOC Progression Note (Signed)
Transition of Care [facility]) - Progression Note    Patient Details  Name: [name] MRN: [id number] Date of Birth: [birth year]  Transition of [facility]) CM/SW Contact  Salome Arnt, [place] Phone Number: 03/16/2022, 9:18 AM  Clinical Narrative:  Per MD, pt will d/c on PO antibiotics. Carolynn Sayers with [employer] updated.        Barriers to Discharge: Continued Medical Work up  Expected Discharge Plan and Services         Expected Discharge Date: 03/16/22                                     Social Determinants of Health (SDOH) Interventions [place]: Unknown (03/13/2022)  Housing: Low Risk  (03/13/2022)  Transportation Needs: No Transportation Needs (03/13/2022)  Utilities: Not At Risk (03/13/2022)  Depression (PHQ2-9): High Risk (01/13/2022)  Tobacco Use: Medium Risk (03/12/2022)    Readmission Risk Interventions    06/04/2020    3:43 PM 01/13/2020    1:02 PM 01/10/2020   12:41 PM  Readmission Risk Prevention Plan  Transportation Screening Complete  Complete  PCP or Specialist Appt within 3-5 Days Complete    HRI or Home Care Consult Complete    Social Work Consult for [place] Planning/Counseling Complete    Palliative Care Screening Not Applicable    Medication Review Press photographer) Complete  Complete  PCP or Specialist appointment within 3-5 days of discharge  Complete   HRI or [place]   Complete  SW Recovery Care/Counseling Consult   Complete  [place]   Not Applicable
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
## 2022-03-16 NOTE — Progress Notes (Signed)
Order for Patient  to be  discharged home. Patients legal guardian (mother) notified of discharge. EMS was called patient was put on list to be picked up. Awaiting for EMS to transport patient home.

## 2022-03-16 NOTE — Progress Notes (Signed)
EMS arrived to transport patient home.

## 2022-03-17 ENCOUNTER — Encounter (HOSPITAL_BASED_OUTPATIENT_CLINIC_OR_DEPARTMENT_OTHER): Payer: Medicaid Other | Attending: General Surgery | Admitting: General Surgery

## 2022-03-17 ENCOUNTER — Telehealth: Payer: Self-pay

## 2022-03-17 DIAGNOSIS — L89154 Pressure ulcer of sacral region, stage 4: Secondary | ICD-10-CM | POA: Diagnosis present

## 2022-03-17 DIAGNOSIS — G8222 Paraplegia, incomplete: Secondary | ICD-10-CM | POA: Insufficient documentation

## 2022-03-17 DIAGNOSIS — S99922A Unspecified injury of left foot, initial encounter: Secondary | ICD-10-CM | POA: Insufficient documentation

## 2022-03-17 DIAGNOSIS — R32 Unspecified urinary incontinence: Secondary | ICD-10-CM | POA: Diagnosis not present

## 2022-03-17 NOTE — Progress Notes (Signed)
EIMAN, MARET A ([id number]) [id number] Page 1 of 8 Visit Report for 03/17/2022 Arrival Information Details Patient Name: Date of Service: [name], [name] 03/17/2022 1:15 PM Medical Record Number: [id number] Patient Account Number: [id number] Date of Birth/Sex: Treating RN: Jan 24, 1981 (42 y.o. Valarie Cones, Mechele Claude Primary Care Shneur [place]: Ronnie Doss Other Clinician: Referring Bethanie Bloxom: Treating Angelik Walls/Extender: Lizbeth Bark in Treatment: 298 Visit Information History Since Last Visit Added or deleted any medications: No Patient Arrived: Wheel Chair Any new allergies or adverse reactions: No Arrival Time: 13:21 Had a fall or experienced change in No Accompanied By: mother activities of daily living that may affect Transfer Assistance: [name] Lift risk of falls: Patient Identification Verified: Yes Signs or symptoms of abuse/neglect since last visito No Patient Requires Transmission-Based Precautions: No Hospitalized since last visit: No Patient Has Alerts: No Implantable device outside of the clinic excluding No cellular tissue based products placed in the center since last visit: Pain Present Now: No Electronic Signature(s) Signed: 03/17/2022 6:04:49 PM By: Dellie Catholic RN Entered By: Dellie Catholic on 03/17/2022 13:22:15 -------------------------------------------------------------------------------- Encounter Discharge Information Details Patient Name: Date of Service: Jamie Boyden A. 03/17/2022 1:15 PM Medical Record Number: [id number] Patient Account Number: [id number] Date of Birth/Sex: Treating RN: 1980/06/18 (42 y.o. [name] Primary Care Jayston Trevino: Ronnie Doss Other Clinician: Referring Miku Udall: Treating Karmela Bram/Extender: Virgel Gess Weeks in Treatment: 298 Encounter Discharge Information Items Post Procedure Vitals Discharge Condition:  Stable Temperature (F): 98.2 Ambulatory Status: Wheelchair Pulse (bpm): 86 Discharge Destination: Home Respiratory Rate (breaths/min): 16 Transportation: Private Auto Blood Pressure (mmHg): 144/110 Accompanied By: Mother Schedule Follow-up Appointment: Yes Clinical Summary of Care: Patient Declined Electronic Signature(s) Signed: 03/17/2022 6:04:49 PM By: Dellie Catholic RN Entered By: Dellie Catholic on 03/17/2022 17:26:50 Curt Jews A ([id number]) [id number] Page 2 of 8 -------------------------------------------------------------------------------- Lower Extremity Assessment Details Patient Name: Date of Service: [name], [name] 03/17/2022 1:15 PM Medical Record Number: [id number] Patient Account Number: [id number] Date of Birth/Sex: Treating RN: [birth year] (42 y.o. [name] Primary Care Henryetta Corriveau: Ronnie Doss Other Clinician: Referring Cassie Shedlock: Treating Tahjai Schetter/Extender: Virgel Gess Weeks in Treatment: 298 Electronic Signature(s) Signed: 03/17/2022 6:04:49 PM By: Dellie Catholic RN Entered By: Dellie Catholic on 03/17/2022 17:22:46 -------------------------------------------------------------------------------- Multi Wound Chart Details Patient Name: Date of Service: Jamie Boyden A. 03/17/2022 1:15 PM Medical Record Number: [id number] Patient Account Number: [id number] Date of Birth/Sex: Treating RN: 1981/01/11 (42 y.o. F) Primary Care Kayston Jodoin: Ronnie Doss Other Clinician: Referring Davione Lenker: Treating Sofi Bryars/Extender: Virgel Gess Weeks in Treatment: 298 [1:Photos:] [N/A:N/A] Medial Sacrum Left Calcaneus N/A Wound Location: Pressure Injury Pressure Injury N/A Wounding Event: Pressure Ulcer Pressure Ulcer N/A Primary Etiology: Anemia, Osteomyelitis Anemia, Osteomyelitis N/A Comorbid History: 05/15/2016 03/06/2022 N/A Date Acquired: 298 0 N/A Weeks of  Treatment: Open Open N/A Wound Status: No No N/A Wound Recurrence: 2.1x1x2 1x1.5x0.1 N/A Measurements L x W x D (cm) 1.649 1.178 N/A A (cm) : rea 3.299 0.118 N/A Volume (cm) : 95.00% N/A N/A % Reduction in A rea: 97.70% N/A N/A % Reduction in Volume: 3 Position 1 (o'clock): 2.3 Maximum Distance 1 (cm): Yes No N/A Tunneling: Category/Stage IV Category/Stage I N/A Classification: Medium N/A N/A Exudate A mount: Serosanguineous N/A N/A Exudate Type: red, brown N/A N/A Exudate Color: Distinct, outline attached N/A N/A Wound Margin: Large (67-100%) None Present (0%) N/A Granulation A mount: Red, Pink N/A N/A Granulation Quality: Small (1-33%) None Present (0%) N/A Necrotic A mount: Fat Layer (Subcutaneous Tissue): Yes Fat  Layer (Subcutaneous Tissue): Yes N/A Exposed Structures: Fascia: No Tendon: No Muscle: No Joint: No Bone: No None Small (1-33%) N/A Epithelialization: [name], Jamie A ([id number]) [id number] Page 3 of 8 Debridement - Excisional N/A N/A Debridement: 14:20 N/A N/A Pre-procedure Verification/Time Out Taken: Lidocaine 5% topical ointment N/A N/A Pain Control: Other, Subcutaneous, Slough N/A N/A Tissue Debrided: Skin/Subcutaneous Tissue N/A N/A Level: 2.1 N/A N/A Debridement A (sq cm): rea Curette N/A N/A Instrument: Minimum N/A N/A Bleeding: Pressure N/A N/A Hemostasis A chieved: 0 N/A N/A Procedural Pain: 0 N/A N/A Post Procedural Pain: Procedure was tolerated well N/A N/A Debridement Treatment Response: 2.1x1x2 N/A N/A Post Debridement Measurements L x W x D (cm) 3.299 N/A N/A Post Debridement Volume: (cm) Category/Stage IV N/A N/A Post Debridement Stage: Rash: Yes Excoriation: No N/A Periwound Skin Texture: Induration: No Callus: No Crepitus: No Rash: No Scarring: No Dry/Scaly: Yes Maceration: No N/A Periwound Skin Moisture: Dry/Scaly: No No Abnormalities Noted Atrophie Blanche: No  N/A Periwound Skin Color: Cyanosis: No Ecchymosis: No Erythema: No Hemosiderin Staining: No Mottled: No Pallor: No Rubor: No No Abnormality No Abnormality N/A Temperature: Debridement N/A N/A Procedures Performed: Treatment Notes Electronic Signature(s) Signed: 03/17/2022 2:37:51 PM By: Fredirick Maudlin MD FACS Entered By: Fredirick Maudlin on 03/17/2022 14:37:51 -------------------------------------------------------------------------------- Multi-Disciplinary Care Plan Details Patient Name: Date of Service: Jamie Boyden A. 03/17/2022 1:15 PM Medical Record Number: [id number] Patient Account Number: [id number] Date of Birth/Sex: Treating RN: [birth year] (42 y.o. [name] Primary Care Mizael Sagar: Ronnie Doss Other Clinician: Referring Keyarra Rendall: Treating Jaquanna Ballentine/Extender: Virgel Gess Weeks in Treatment: [place] reviewed with physician Active Inactive Pressure Nursing Diagnoses: Knowledge deficit related to management of pressures ulcers Potential for impaired tissue integrity related to pressure, friction, moisture, and shear Goals: Patient will remain free from development of additional pressure ulcers Date Initiated: 06/27/2016 Date Inactivated: 01/17/2019 Target Resolution Date: 12/24/2018 Goal Status: Met Patient/caregiver will verbalize risk factors for pressure ulcer development Date Initiated: 06/27/2016 Date Inactivated: 07/08/2017 Target Resolution Date: 07/08/2017 Goal Status: Met Patient/caregiver will verbalize understanding of pressure ulcer management [name], Jamie A ([id number]) [id number] Page 4 of 8 Date Initiated: 06/27/2016 Target Resolution Date: 07/01/2022 Goal Status: Active Interventions: Assess: immobility, friction, shearing, incontinence upon admission and as needed Assess offloading mechanisms upon admission and as needed Assess potential for pressure  ulcer upon admission and as needed Provide education on pressure ulcers Treatment Activities: Patient referred for pressure reduction/relief devices : 06/27/2016 Pressure reduction/relief device ordered : 06/27/2016 Notes: Wound/Skin Impairment Nursing Diagnoses: Impaired tissue integrity Knowledge deficit related to smoking impact on wound healing Knowledge deficit related to ulceration/compromised skin integrity Goals: Patient will demonstrate a reduced rate of smoking or cessation of smoking Date Initiated: 06/27/2016 Date Inactivated: 01/26/2017 Target Resolution Date: 01/08/2017 Goal Status: Unmet Unmet Reason: pt continues to smoke Patient/caregiver will verbalize understanding of skin care regimen Date Initiated: 06/27/2016 Target Resolution Date: 07/01/2022 Goal Status: Active Ulcer/skin breakdown will have a volume reduction of 50% by week 8 Date Initiated: 06/27/2016 Date Inactivated: 12/11/2016 Target Resolution Date: 07/25/2016 Goal Status: Met Interventions: Assess patient/caregiver ability to obtain necessary supplies Assess patient/caregiver ability to perform ulcer/skin care regimen upon admission and as needed Assess ulceration(s) every visit Provide education on smoking Provide education on ulcer and skin care Treatment Activities: Skin care regimen initiated : 06/27/2016 Topical wound management initiated : 06/27/2016 Notes: Electronic Signature(s) Signed: 03/17/2022 6:04:49 PM By: Dellie Catholic RN Entered By: Dellie Catholic on 03/17/2022 17:25:35 -------------------------------------------------------------------------------- Pain Assessment Details Patient Name:  Date of Service: [name], [name] 03/17/2022 1:15 PM Medical Record Number: [id number] Patient Account Number: [id number] Date of Birth/Sex: Treating RN: 1981-01-30 (42 y.o. [name] Primary Care James Senn: Ronnie Doss Other Clinician: Referring Genavie Boettger: Treating  Moataz Tavis/Extender: Virgel Gess Weeks in Treatment: 298 Active Problems Location of Pain Severity and Description of Pain Patient Has Paino No Site Locations [name], Jamie ([id number]) [id number] Page 5 of 8 Pain Management and Medication Current Pain Management: Electronic Signature(s) Signed: 03/17/2022 6:04:49 PM By: Dellie Catholic RN Entered By: Dellie Catholic on 03/17/2022 17:22:37 -------------------------------------------------------------------------------- Patient/Caregiver Education Details Patient Name: Date of Service: [name] 1/15/2024andnbsp1:15 PM Medical Record Number: [id number] Patient Account Number: [id number] Date of Birth/Gender: Treating RN: 01/14/1981 (42 y.o. [name] Primary Care Physician: Ronnie Doss Other Clinician: Referring Physician: Treating Physician/Extender: Lizbeth Bark in Treatment: 298 Education Assessment Education Provided To: Patient Education Topics Provided Wound/Skin Impairment: Methods: Explain/Verbal Responses: Return demonstration correctly Electronic Signature(s) Signed: 03/17/2022 6:04:49 PM By: Dellie Catholic RN Entered By: Dellie Catholic on 03/17/2022 17:25:49 -------------------------------------------------------------------------------- Wound Assessment Details Patient Name: Date of Service: [name], Jamie A. 03/17/2022 1:15 PM Medical Record Number: [id number] Patient Account Number: [id number] Date of Birth/Sex: Treating RN: 05/16/1980 (42 y.o. [name] Primary Care Meeah Totino: Ronnie Doss Other Clinician: Referring Cellie Dardis: Treating Monick Rena/Extender: Virgel Gess [place], Sentinel A ([id number]) [id number] Page 6 of 8 Weeks in Treatment: 298 Wound Status Wound Number: 1 Primary Etiology: Pressure Ulcer Wound Location: Medial  Sacrum Wound Status: Open Wounding Event: Pressure Injury Comorbid History: Anemia, Osteomyelitis Date Acquired: 05/15/2016 Weeks Of Treatment: 298 Clustered Wound: No Photos Wound Measurements Length: (cm) 2.1 Width: (cm) 1 Depth: (cm) 2 Area: (cm) 1.649 Volume: (cm) 3.299 % Reduction in Area: 95% % Reduction in Volume: 97.7% Epithelialization: None Tunneling: Yes Position (o'clock): 3 Maximum Distance: (cm) 2.3 Undermining: No Wound Description Classification: Category/Stage IV Wound Margin: Distinct, outline attached Exudate Amount: Medium Exudate Type: Serosanguineous Exudate Color: red, brown Foul Odor After Cleansing: No Slough/Fibrino No Wound Bed Granulation Amount: Large (67-100%) Exposed Structure Granulation Quality: Red, Pink Fascia Exposed: No Necrotic Amount: Small (1-33%) Fat Layer (Subcutaneous Tissue) Exposed: Yes Necrotic Quality: Adherent Slough Tendon Exposed: No Muscle Exposed: No Joint Exposed: No Bone Exposed: No Periwound Skin Texture Texture Color No Abnormalities Noted: No No Abnormalities Noted: Yes Rash: Yes Temperature / Pain Temperature: No Abnormality Moisture No Abnormalities Noted: No Dry / Scaly: Yes Treatment Notes Wound #1 (Sacrum) Wound Laterality: Medial Cleanser Soap and Water Discharge Instruction: May shower and wash wound with dial antibacterial soap and water prior to dressing change. Anasept Antimicrobial Skin and Wound Cleanser, 8 (oz) Discharge Instruction: Cleanse the wound with Anasept cleanser prior to applying a clean dressing using gauze sponges, not tissue or cotton balls. Peri-Wound Care Zinc Oxide Ointment 30g tube Discharge Instruction: Apply Zinc Oxide to periwound as needed for moisture [name], Jamie A ([id number]) [id number] Page 7 of 8 Topical Primary Dressing Anasept Antimicrobial Wound Gel, 1.5 (oz) tube Discharge Instruction: moisten gauze with anasept and pack  wound VASHE Discharge Instruction: at home wet to dry gauze Secondary Dressing ABD Pad, 5x9 Discharge Instruction: Apply over primary dressing as directed. Woven Gauze Sponge, Non-Sterile 4x4 in Discharge Instruction: Apply over primary dressing as directed. Secured With 57M Medipore H Soft Cloth Surgical T ape, 4 x 10 (in/yd) Discharge Instruction: Secure with tape as directed. Compression Wrap Compression Stockings Add-Ons Electronic Signature(s) Signed: 03/17/2022 6:04:49 PM By: Dellie Catholic RN  Entered By: Dellie Catholic on 03/17/2022 13:59:35 -------------------------------------------------------------------------------- Wound Assessment Details Patient Name: Date of Service: [name], [name] 03/17/2022 1:15 PM Medical Record Number: [id number] Patient Account Number: [id number] Date of Birth/Sex: Treating RN: 1980-08-03 (42 y.o. [name] Primary Care Korde Jeppsen: Ronnie Doss Other Clinician: Referring Sherron Mapp: Treating Janeah Kovacich/Extender: Virgel Gess Weeks in Treatment: 298 Wound Status Wound Number: 17 Primary Etiology: Pressure Ulcer Wound Location: Left Calcaneus Wound Status: Open Wounding Event: Pressure Injury Comorbid History: Anemia, Osteomyelitis Date Acquired: 03/06/2022 Weeks Of Treatment: 0 Clustered Wound: No Photos Wound Measurements Length: (cm) 1 Width: (cm) 1.5 Depth: (cm) 0.1 Area: (cm) 1.178 Volume: (cm) 0.118 [name], Kristien A ([id number]) Wound Description Classification: Category/Stage I % Reduction in Area: % Reduction in Volume: Epithelialization: Small (1-33%) Tunneling: No Undermining: No [id number] Page 8 of 8 Wound Bed Granulation Amount: None Present (0%) Exposed Structure Necrotic Amount: None Present (0%) Fat Layer (Subcutaneous Tissue) Exposed: Yes Periwound Skin Texture Texture Color No Abnormalities Noted: No No Abnormalities Noted: Yes Callus: No  Temperature / Pain Crepitus: No Temperature: No Abnormality Excoriation: No Induration: No Rash: No Scarring: No Moisture No Abnormalities Noted: Yes Treatment Notes Wound #17 (Calcaneus) Wound Laterality: Left Cleanser Peri-Wound Care Topical Primary Dressing Secondary Dressing Secured With Compression Wrap Compression Stockings Add-Ons Electronic Signature(s) Signed: 03/17/2022 6:04:49 PM By: Dellie Catholic RN Entered By: Dellie Catholic on 03/17/2022 14:00:38 -------------------------------------------------------------------------------- Vitals Details Patient Name: Date of Service: Jamie Boyden A. 03/17/2022 1:15 PM Medical Record Number: [id number] Patient Account Number: [id number] Date of Birth/Sex: Treating RN: 09-28-1980 (42 y.o. [name] Primary Care Tasnim Balentine: Ronnie Doss Other Clinician: Referring Julianah Marciel: Treating Jonie Burdell/Extender: Virgel Gess Weeks in Treatment: 298 Vital Signs Time Taken: 13:25 Temperature (F): 98.2 Height (in): 65 Pulse (bpm): 86 Weight (lbs): 201 Respiratory Rate (breaths/min): 16 Body Mass Index (BMI): 33.4 Blood Pressure (mmHg): 144/110 Reference Range: 80 - 120 mg / dl Electronic Signature(s) Signed: 03/17/2022 6:04:49 PM By: Dellie Catholic RN Entered By: Dellie Catholic on 03/17/2022 17:22:29

## 2022-03-17 NOTE — Telephone Encounter (Signed)
Transition Care Management Unsuccessful Follow-up Telephone Call  Date of discharge and from where:  [facility] 03/16/20[facility]  Attempts:  1st Attempt  Reason for unsuccessful TCM follow-up call:  Left voice message

## 2022-03-18 NOTE — Progress Notes (Signed)
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 [name] UTI. She also went underwent [name] surgical debridement of her lower sacral wound. She comes in today with [name] really large postoperative wound. This does not have exposed bone but very  close. This is right back to where this was [name] year or 2 ago. They have been using wet-to-dry. She is on an IV antibiotic but is not sure what drugs. We are not sure what home health company they are currently using. We are trying to get records from [facility]. 6/8; this the patient return to clinic 3 [name] ago. She had surgical debridement of the lower sacral wound. She apparently is completed her IV antibiotics although I am not exactly sure what IV antibiotics she had ordered. Her PICC line is come out. Her wound is large but generally clean there still is exposed bone. Fortunately the area on the right heel is callused over I cannot prove there is an open area here per 6/22; they are using [name] wet to dry dressing when we left the clinic last time however they managed to find [name] box of silver alginate so they are using that with backing gauze. They are still changing this twice [name] day because of drainage issues. She has Medicaid and they will not be able to replace the want dressing she will have to go back to [name] wet-to-dry. In spite of this the wound actually looks quite good some improvement in dimension 7/13; using silver alginate. Slight improvement in overall wound volume including depth although there is still undermining. She tells me she is offloading this is much as possible 8/10-Patient back at 1 month, continuing to use silver alginate although she has run out and therefore using saline wet-to-dry, undermining is minimal, continuing attempts at offloading according to patient 9/21; its been about 9 [name] since I have seen this patient although I note she was seen once in August. Her wounds on the lower sacrum this looks [name] lot better than the last time I saw this. She  is using silver alginate to the wound bed. They only have Medicaid therefore they have to need to pay out-of-pocket for the dressing supplies. This certainly limits options. 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 10/19; 1 month follow-up. Not much change in the wound in fact it is measuring slightly larger. Still using silver alginate which her mother is purchasing off [employer] I believe. Since she was here she had [name] suprapubic catheter placed because of [name] lacerated urethra. 11/30; patient has not been here in about 6 [name]. She apparently has had 3 admissions to the [name] for pneumonia in the last 7 months 2 in the last 2 months. She came out of [name] this time with 4 L of oxygen. Her mother is using the Anasept wet-to-dry to the sacral wound and surprisingly this looks somewhat better. The patient states she is pending 24/7 in bed except for going to doctor's appointments this may be helping. She has an appointment with pulmonology on 12/17. She was [name] heavy smoker for [name] period of time I wonder whether she has COPD 12/28; patient is doing well she is off her oxygen which may have something to do with chronic Macrobid she was on [hypersensitivity pneumonitis]. She is also stop smoking. In any case she is doing well from [name] breathing point of view. She is using Anasept wet-to-dry. 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 She has been using Anasept gel wet-to-dry and really has done [name] nice job here. She does not have insurance for other wound care products but truthfully so far this is done [name] really good job. I note she is back on oxygen. 2/22; stage IV wound on her lower sacrum. They have been to using an Anasept gel wet-to-dry-based dressing. Ordinarily I would like to use [name] moistened collagen wet-to-dry but she is apparently not able to afford this. There was also some suggestion about using Dakin's but apparently her mother could not make 3/14; 3-week follow-up. She is going for bladder surgery at [facility] next week. Still using Anasept gel wet-to-dry. We will try to get Prisma  wet to dry through what I think is [facility]. This probably will not work 4/19; 4-week follow-up. She is using collagen with wet-to-dry dressings every day. She reports improvement to the wound healing. Patient had bladder neck surgery with suprapubic catheter placement on 3/22. On 3/30 she was sent to the emergency department because of hypotension. She was found to be in septic shock in the setting of ESBL E. coli UTI. Currently she is doing well. 5/17; 4-week follow-up. She is using collagen with backing wet-to-dry. Really nice improvement in surface area of these wounds depth at 1.2 cm. She has had problems with urinary leakage. She does have [name] suprapubic catheter 6/14; 4-week follow-up. She was doing really well the last time we saw her in the wound had filled in quite [name] bit. However since that time her wound is gotten [name] lot deeper. Apparently her bladder surgery failed and she is incontinent [name] lot of the time. Furthermore her mother suffered [name] stroke and is staying with her sister. She now has care attendance but I am not really certain about the amount of care she has. We have been using silver collagen but apparently the family has to buy this privately. 6/28; the wound measures slightly larger I  certainly do not see the difference. It has 1.5 cm of direct depth but not exposed to bone it does not appear to be infected. We had her using silver alginate although she does not seem to know what her mother's been dressing this with recently 7/12; 2-week follow-up. 1.7 mm of direct depth this is fluctuated [name] fair amount but I do not see any consistent trend towards closure. The tissue looks healthy minimal undermining no palpable bone. No evidence of surrounding infection. We have been using silver alginate wet-to-dry although the patient wants to go back to Anasept gel wet to dry 8/30; we have seen this patient in quite [name] while however her wound has come in nicely at roughly 0.8 or 0.9  mm of direct depth also down in length and width. She is using Anasept wet-to-dry her mother is changing the dressing 9/27; 1 month follow-up. Since she is being here last she apparently has had an attempt to close her urethra by urology in [name] this was temporarily successful but now is reopened.. We have been using Anasept wet-to-dry. Her variant of Medicaid will not pay for wound care supplies. Most of what the use is being paid for out of their own pocket 10/25; 1 month follow-up. Her wound is measuring better she is still using Anasept wet-to-dry. Her mother changes this twice [name] day because of drainage. Overall the wound dimensions are better. The patient states that her recent urologic procedure actually resulted in less incontinence which may be helping Apparently had her mattress overlay on her bed is disintegrating. She asked if I be willing to put in an order for replacement I told her we would try her best although I am not exactly sure how long Medicaid will allow for replacement 11/29; 1 month follow-up. She arrives with her mother who is her primary caregiver. There is still using Anasept wet-to-dry but her mother says because of drainage they are changing this 3 times [name] day. Dimensions are [name] little worse or as last time she was actually [name] little better 12/13; the patient's wound had deteriorated last time. I did [name] culture of the wound that showed of few rare Pseudomonas and [name] few rare Corynebacterium. The Corynebacterium is likely [name] skin contaminant. I did prescribe topical gentamicin under the silver alginate directed at the Pseudomonas. Her mother says that there is [name] lot of drainage she changes the odor gauze packing 3 times [name] day currently using 6 gentamicin under silver alginate covering all of this with ABDs 03/13/2021; patient is using silver alginate. Very little change in this wound this actually goes down to bone with [name] rim of tissue separating environment from [place],  [place] ([id number]) [phone].pdf Page 5 of 17 underlying sacral bone. There is no real granulation. At the base of this. She has not been systemically unwell. The wound itself not much changed however it abuts right on bone. She has Medicaid which does not give [place] [name] lot of options for either home care or different dressings. We thought this over in some detail. We might be able to get [name] wound VAC through Medicaid but we would not be able to get this changed by home health. She lives in [place] which she would be [name] far drive to this clinic twice [name] week. We might be able to teach the daughter or friend how to do this but there is [name] lot of issues that need to be worked through before we put this  into the final ordering status 03/28/2021; the wound actually looks somewhat better contracted. There is still some undermining but no evidence of infection. We have been using gentamicin and silver alginate. 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 She is apparently going for some form of tendon release next week by orthopedics. Her knees are fixed in extension right leg first apparently 2/16; the wound looks clean not much change in dimensions. She has undermining at roughly 4:00. There is no exposed bone. We are using silver alginate with underlying gentamicin. The last culture I did of this in December showed Pseudomonas which is the reason for the gentamicin topical Since patient was last here she had quadricep tendon release surgery at [name] 05/09/2021: There has been improvement overall in the wound. The base is fairly clean with minimal slough. The size has contracted.[name] Unfortunately, one of her wounds from her quadricep tendon release is open and there is extensive nonviable tissue. She reports that she is going to have to have this operatively debrided. 06/06/2021: Since her last visit in clinic, she underwent  debridement of the open wound from her quadricep tendon release with placement of Kerecis and primary closure. Her sutures have been removed and she saw her surgeon last week. In the interim, [name] clip from her suprapubic catheter caused [name] new wound to occur on her right thigh and her old left heel pressure ulcer has reopened. Her sacral wound is smaller. None of the wounds appears infected, but there is epibole around the sacral wound and fairly thick slough on the heel wound. 06/20/2021: The heel ulcer is clean with good granulation tissue. The sacral wound is smaller but measures [name] little deeper today. There is heaped up senescent skin around the edges of the sacral wound. The wound on her thigh is [name] little bit bigger but remains fairly superficial. 07/04/2021: The heel ulcer is nearly completely healed. Very little open tissue. The sacral wound continues to contract around the cavity. There is [name] lot of heaped up senescent tissue around the edges of the wound. The wound on her thigh has not really made much improvement at all. It remains superficial but has [name] thick layer of yellow slough. No concern for infection in any of the wound sites. 07/18/2021: The heel ulcer has closed. The sacral wound is shallower with [name] little bit of slough in the wound base. There is less senescent tissue around the perimeter. The wound on her thigh looks like it might be [name] little deeper in the center. It continues to have [name] fibrous slough layer. No concern for infection. 07/25/2021: We initiated wound VAC therapy last week. The sacral wound is smaller but still has some undermining. There is just [name] little bit of slough in the wound base. The wound on her thigh we began treating with Santyl. The fibrinous slough has diminished and there is some granulation tissue beginning to form. 08/01/2021: For some reason, the sacral wound measures deeper today. It is fairly clean and there is no significant odor from the wound, but the  patient's mother reports an increase in the drainage. She has struggled with maintaining [name] good seal with the drape, given the location of the wound. On the other hand, the thigh wound is smaller and shallower. There is just some fibrinous slough on the surface. 08/09/2021: The patient was hospitalized last week with [name] urinary tract infection. She is still feeling [name] bit poorly today. She  is currently taking [name] course of oral antibiotics. The sacral wound is [name] little bit smaller today. It does have [name] layer of slough on the surface. The wound on her thigh continues to contract and is fairly superficial today. There is fibrinous slough at the site, as well. 08/23/2021: The patient was hospitalized again this last week with sepsis. It sounds like urology is planning to perform [name] cystectomy with ileal conduit to help with her urinary leakage issues. She apparently developed [name] cutaneous fungal infection where her wound VAC drape was and so that has been removed and her mom is doing wet-to-dry dressing changes. The periwound skin is now healing, but it is still fairly red and irritated. She has [name] deep tissue injury on the same heel that we had previously treated for an open ulcer, but there is currently no skin opening. The wound on her thigh is very superficial and clean without any slough accumulation. 08/30/2021: The heel looks good today and has not opened. The wound on her thigh is closed. The sacral wound looks better, particularly the periwound skin. Her mother has been applying [name] mixture of Desitin and miconazole to the periwound and just doing wet-to-dry dressings because we did not want to further irritate the skin with the wound VAC drape. The wound is clean with just [name] little bit of superficial slough and senescent skin heaped up around the margin. 09/05/2021: The periwound skin at her sacral ulcer site is markedly improved. Immediately surrounding the wound orifice, there is some thick, dried, and  scaly skin. There is also some slough buildup on the wound surface. No odor or significant drainage. 7/14; the patient still has the same depth however surrounding granulation looks better. Using wound VAC with topical [facility]stone antibiotics. She may be eligible for an improved mattress cover and we will look into it 10/11/2021: The wound is [name] little bit deeper today. She is struggling with urinary contamination of her sacral wound and has subsequently developed maceration and some tissue breakdown. No obvious external infection. There is good granulation tissue on the surface with just [name] little bit of light slough. 10/25/2021: The wound looks better today. The depth has come in by over [name] centimeter. Her skin is in better condition. There is heaped up senescent skin around the wound perimeter. She received her level 3 bed and is very happy with it. 11/08/2021: The undermining has come in substantially. The overall wound depth is about the same. She continues to build up senescent skin around the wound perimeter. 11/22/2021: The wound apparently measured [name] little bit deeper, but it no longer undermines much at all. It is clean. There is senescent skin accumulation around the perimeter, as usual. 01/14/2022: The patient returns to clinic after undergoing extensive surgical intervention at [facility] for her urethral erosion. This was complicated by sepsis secondary to pneumonia. Her wound actually looks quite good. The size is about the same, but the depth has come in considerably and there is less undermining. It is [name] little bit dry around the edges. No concern for infection. 03/17/2022: The patient has been in and out of the [name] since her last visit, primarily with episodes of sepsis related to urinary infections. They have been having issues with her urostomy bags leaking having issues and the patient's mother is concerned that some of the contaminated urine may be reaching  her sacral ulcer, which has enlarged since the last time we saw her. On inspection, the wound is  [name] little larger, but it is very clean with just some slough and accumulation of senescent skin around the margins. Electronic Signature(s) Signed: 03/17/2022 2:41:23 PM By: Jamie Maudlin MD FACS Entered By: Jamie [name] on 03/17/2022 14:41:23 Jamie [name] ([id number]) [id number] Page 6 of 17 -------------------------------------------------------------------------------- Physical Exam Details Patient Name: Date of Service: Jamie [name], Jamie [name] 03/17/2022 1:15 PM Medical Record Number: [id number] Patient Account Number: [id number] Date of Birth/Sex: Treating RN: 25-May-1980 (42 y.o. F) Primary Care Provider: Ronnie [name] Other Clinician: Referring Provider: Treating Provider/Extender: Jamie [name] in Treatment: 298 Constitutional no acute distress. Respiratory Normal work of breathing on room air. Notes 03/17/2022: The wound is [name] little larger, but it is very clean with just some slough and accumulation of senescent skin around the margins. Electronic Signature(s) Signed: 03/17/2022 2:50:23 PM By: Jamie Maudlin MD FACS Previous Signature: 03/17/2022 2:42:37 PM Version By: Jamie Maudlin MD FACS Entered By: Jamie [name] on 03/17/2022 14:50:23 -------------------------------------------------------------------------------- Physician Orders Details Patient Name: Date of Service: Jamie [name], Jamie [name]. 03/17/2022 1:15 PM Medical Record Number: [id number] Patient Account Number: [id number] Date of Birth/Sex: Treating RN: 15-Aug-1980 (42 y.o. Jamie [name] Primary Care Provider: Ronnie [name] Other Clinician: Referring Provider: Treating Provider/Extender: Jamie [name] in Treatment: [phone] Verbal / Phone Orders: No Diagnosis Coding ICD-10 Coding Code Description L89.154 Pressure  ulcer of sacral region, stage 4 G82.22 Paraplegia, incomplete Follow-up Appointments Return appointment in 1 month. - ++++ HOYER EXTRA TIME++++++Dr. Celine [name] Room 3 Anesthetic Wound #1 Medial Sacrum (In clinic) Topical Lidocaine 4% applied to wound bed Off-Loading [name] fluidized (Group 3) mattress - medical modalities ir Turn and reposition every 2 hours Wound Treatment Wound #1 - Sacrum Wound Laterality: Medial Cleanser: Soap and Water 1 x Per Day/30 Days Discharge Instructions: May shower and wash wound with dial antibacterial soap and water prior to dressing change. Cleanser: Anasept Antimicrobial Skin and Wound Cleanser, 8 (oz) 1 x Per Day/30 Days Discharge Instructions: Cleanse the wound with Anasept cleanser prior to applying [name] clean dressing using gauze sponges, not tissue or cotton balls. Peri-Wound Care: Zinc Oxide Ointment 30g tube 1 x Per Day/30 Days Jamie [name], Jamie [name] ([id number]) [phone].pdf Page 7 of 17 Discharge Instructions: Apply Zinc Oxide to periwound as needed for moisture Prim Dressing: Anasept Antimicrobial Wound Gel, 1.5 (oz) tube 1 x Per Day/30 Days ary Discharge Instructions: moisten gauze with anasept and pack wound Prim Dressing: VASHE 1 x Per Day/30 Days ary Discharge Instructions: at home wet to dry gauze Secondary Dressing: ABD Pad, 5x9 1 x Per Day/30 Days Discharge Instructions: Apply over primary dressing as directed. Secondary Dressing: Woven Gauze Sponge, Non-Sterile 4x4 in 1 x Per Day/30 Days Discharge Instructions: Apply over primary dressing as directed. Secured With: 57M Medipore H Soft Cloth Surgical T ape, 4 x 10 (in/yd) (Generic) 1 x Per Day/30 Days Discharge Instructions: Secure with tape as directed. Laboratory naerobe culture (MICRO) - SACRUM - (ICD10 L89.154 - Pressure ulcer of sacral region, stage 4) Bacteria identified in Unspecified specimen by [name] LOINC Code: 193-7 Convenience Name: Anaerobic culture Electronic  Signature(s) Signed: 03/17/2022 6:04:49 PM By: Jamie Catholic RN Signed: 03/18/2022 7:32:15 AM By: Jamie Maudlin MD FACS Previous Signature: 03/17/2022 3:23:09 PM Version By: Jamie Maudlin MD FACS Entered By: Jamie [name] on 03/17/2022 17:24:59 Prescription 03/17/2022 -------------------------------------------------------------------------------- Tiana Loft, Arabela [name]. Jamie Maudlin MD Patient Name: Provider: 06-19-1980 [id number] Date of Birth: NPI#: [id number] Sex: DEA #: [phone] Phone #: License #: [place] [name] Patient Address: [address] [place], [place] [address] [place], [place] [address] [phone] Allergies No Known Allergies Provider's Orders naerobe culture - ICD10: L89.154 - SACRUM Bacteria identified in Unspecified specimen by [name] LOINC Code: 401-0 Convenience Name: Anaerobic culture Hand Signature: Date(s): Electronic Signature(s) Signed: 03/17/2022 6:04:49 PM By: Jamie Catholic RN Signed: 03/18/2022 7:32:15 AM By: Jamie Maudlin MD FACS Previous Signature: 03/17/2022 2:55:20 PM Version By: Jamie Maudlin MD FACS Entered By: Jamie [name] on 03/17/2022 17:25:00 Jamie [name], Jamie [name] ([id number]) [id number] Page 8 of 17 -------------------------------------------------------------------------------- Problem List Details Patient Name: Date of Service: Jamie [name], Jamie [name] 03/17/2022 1:15 PM Medical Record Number: [id number] Patient Account Number: [id number] Date of Birth/Sex: Treating RN: 05-Jan-1981 (42 y.o. 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, sacral and sacrococcygeal region 06/27/2016 06/27/2016 T81.31XA Disruption of external operation (surgical) wound, not elsewhere classified, initial 09/12/2016 09/12/2016 encounter L97.521 Non-pressure chronic ulcer of 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 11/06/2017 11/06/2017 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 11/20/2017 11/20/2017 L89.623 Pressure ulcer of left heel, stage 3 08/09/2019 08/09/2019 Resolved Problems ICD-10 Code Description Active Date Resolved Date L89.624 Pressure ulcer of left heel, stage 4 06/27/2016 06/27/2016 L97.811 Non-pressure chronic ulcer of other part of right lower leg limited to breakdown of skin 09/20/2018 09/20/2018 L89.620 Pressure ulcer of left heel, unstageable 05/13/2017 05/13/2017 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 03/17/2022 1:15 PM Medical Record Number: [id number] Patient Account Number: [id number] Date of Birth/Sex: Treating RN: [birth year] (42 y.o. F)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

## 2022-03-18 NOTE — Telephone Encounter (Signed)
Transition Care Management Unsuccessful Follow-up Telephone Call  Date of discharge and from where:  [name] 03/16/2022  Attempts:  2nd Attempt  Reason for unsuccessful TCM follow-up call:  No answer/busy

## 2022-03-20 ENCOUNTER — Ambulatory Visit (INDEPENDENT_AMBULATORY_CARE_PROVIDER_SITE_OTHER): Payer: Medicaid Other | Admitting: Internal Medicine

## 2022-03-20 ENCOUNTER — Other Ambulatory Visit: Payer: Self-pay

## 2022-03-20 VITALS — BP 132/82 | HR 87 | Temp 98.4°F

## 2022-03-20 DIAGNOSIS — N3021 Other chronic cystitis with hematuria: Secondary | ICD-10-CM | POA: Diagnosis present

## 2022-03-20 DIAGNOSIS — Z8619 Personal history of other infectious and parasitic diseases: Secondary | ICD-10-CM | POA: Diagnosis not present

## 2022-03-20 DIAGNOSIS — N390 Urinary tract infection, site not specified: Secondary | ICD-10-CM | POA: Diagnosis not present

## 2022-03-20 DIAGNOSIS — A419 Sepsis, unspecified organism: Secondary | ICD-10-CM

## 2022-03-20 NOTE — Patient Instructions (Signed)
I have placed referral to Infectious Disease at [facility] for another opinion.  I will send my notes to Dr Lajuana Ripple so that she has plan for you closer to home.
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
## 2022-03-24 NOTE — Telephone Encounter (Signed)
Transition Care Management Unsuccessful Follow-up Telephone Call  Date of discharge and from where:  [name] 03/16/2022  Attempts:  3rd Attempt  Reason for unsuccessful TCM follow-up call:  No answer/busy

## 2022-03-27 ENCOUNTER — Telehealth (HOSPITAL_COMMUNITY): Payer: Self-pay

## 2022-03-28 ENCOUNTER — Encounter: Payer: Self-pay | Admitting: Oncology

## 2022-03-28 ENCOUNTER — Other Ambulatory Visit: Payer: Self-pay

## 2022-03-28 ENCOUNTER — Inpatient Hospital Stay: Payer: Medicaid Other | Attending: Oncology | Admitting: Oncology

## 2022-03-28 ENCOUNTER — Inpatient Hospital Stay: Payer: Medicaid Other

## 2022-03-28 VITALS — BP 118/90 | HR 80 | Temp 97.9°F | Resp 19 | Ht 66.0 in

## 2022-03-28 DIAGNOSIS — D75839 Thrombocytosis, unspecified: Secondary | ICD-10-CM

## 2022-03-28 DIAGNOSIS — D509 Iron deficiency anemia, unspecified: Secondary | ICD-10-CM | POA: Insufficient documentation

## 2022-03-28 DIAGNOSIS — G9340 Encephalopathy, unspecified: Secondary | ICD-10-CM | POA: Insufficient documentation

## 2022-03-28 DIAGNOSIS — D649 Anemia, unspecified: Secondary | ICD-10-CM

## 2022-03-28 DIAGNOSIS — N319 Neuromuscular dysfunction of bladder, unspecified: Secondary | ICD-10-CM

## 2022-03-28 DIAGNOSIS — L89154 Pressure ulcer of sacral region, stage 4: Secondary | ICD-10-CM

## 2022-03-28 LAB — RETICULOCYTES
Immature Retic Fract: 28.6 % — ABNORMAL HIGH (ref 2.3–15.9)
RBC.: 4.04 MIL/uL (ref 3.87–5.11)
Retic Count, Absolute: 89.3 10*3/uL (ref 19.0–186.0)
Retic Ct Pct: 2.2 % (ref 0.4–3.1)

## 2022-03-28 LAB — CBC WITH DIFFERENTIAL (CANCER CENTER ONLY)
Abs Immature Granulocytes: 0.06 10*3/uL (ref 0.00–0.07)
Basophils Absolute: 0.1 10*3/uL (ref 0.0–0.1)
Basophils Relative: 1 %
Eosinophils Absolute: 0.4 10*3/uL (ref 0.0–0.5)
Eosinophils Relative: 4 %
HCT: 33.9 % — ABNORMAL LOW (ref 36.0–46.0)
Hemoglobin: 10.7 g/dL — ABNORMAL LOW (ref 12.0–15.0)
Immature Granulocytes: 1 %
Lymphocytes Relative: 25 %
Lymphs Abs: 2.7 10*3/uL (ref 0.7–4.0)
MCH: 26.3 pg (ref 26.0–34.0)
MCHC: 31.6 g/dL (ref 30.0–36.0)
MCV: 83.3 fL (ref 80.0–100.0)
Monocytes Absolute: 0.8 10*3/uL (ref 0.1–1.0)
Monocytes Relative: 8 %
Neutro Abs: 6.7 10*3/uL (ref 1.7–7.7)
Neutrophils Relative %: 61 %
Platelet Count: 683 10*3/uL — ABNORMAL HIGH (ref 150–400)
RBC: 4.07 MIL/uL (ref 3.87–5.11)
RDW: 18.2 % — ABNORMAL HIGH (ref 11.5–15.5)
WBC Count: 10.7 10*3/uL — ABNORMAL HIGH (ref 4.0–10.5)
nRBC: 0 % (ref 0.0–0.2)

## 2022-03-28 LAB — TECHNOLOGIST SMEAR REVIEW: Plt Morphology: INCREASED

## 2022-03-28 LAB — FERRITIN: Ferritin: 337 ng/mL — ABNORMAL HIGH (ref 11–307)

## 2022-03-28 LAB — SEDIMENTATION RATE: Sed Rate: 52 mm/hr — ABNORMAL HIGH (ref 0–22)

## 2022-03-28 LAB — C-REACTIVE PROTEIN: CRP: 6.9 mg/dL — ABNORMAL HIGH (ref <1.0)
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
## 2022-03-30 LAB — HAPTOGLOBIN: Haptoglobin: 413 mg/dL — ABNORMAL HIGH (ref 33–278)

## 2022-03-31 ENCOUNTER — Telehealth: Payer: Self-pay | Admitting: Oncology

## 2022-03-31 LAB — KAPPA/LAMBDA LIGHT CHAINS
Kappa free light chain: 27.2 mg/L — ABNORMAL HIGH (ref 3.3–19.4)
Kappa, lambda light chain ratio: 0.84 (ref 0.26–1.65)
Lambda free light chains: 32.3 mg/L — ABNORMAL HIGH (ref 5.7–26.3)

## 2022-03-31 NOTE — Telephone Encounter (Signed)
Spoke with patient mother confirming upcoming appointments  

## 2022-04-02 ENCOUNTER — Telehealth (INDEPENDENT_AMBULATORY_CARE_PROVIDER_SITE_OTHER): Payer: Medicaid Other | Admitting: Family Medicine

## 2022-04-02 ENCOUNTER — Encounter: Payer: Self-pay | Admitting: Family Medicine

## 2022-04-02 DIAGNOSIS — Z8701 Personal history of pneumonia (recurrent): Secondary | ICD-10-CM

## 2022-04-02 DIAGNOSIS — J069 Acute upper respiratory infection, unspecified: Secondary | ICD-10-CM

## 2022-04-02 DIAGNOSIS — Z20822 Contact with and (suspected) exposure to covid-19: Secondary | ICD-10-CM

## 2022-04-02 LAB — VITAMIN B1: Vitamin B1 (Thiamine): 130.8 nmol/L (ref 66.5–200.0)

## 2022-04-02 MED ORDER — AZITHROMYCIN 250 MG PO TABS: ORAL_TABLET | ORAL | 0 refills | Status: DC

## 2022-04-02 MED ORDER — PROMETHAZINE-DM 6.25-15 MG/5ML PO SYRP: 5.0000 mL | ORAL_SOLUTION | Freq: Four times a day (QID) | ORAL | 0 refills | Status: DC | PRN

## 2022-04-02 MED ORDER — DOXYCYCLINE HYCLATE 100 MG PO TABS: 100.0000 mg | ORAL_TABLET | Freq: Two times a day (BID) | ORAL | 0 refills | Status: AC

## 2022-04-02 MED ORDER — GUAIFENESIN ER 600 MG PO TB12: 600.0000 mg | ORAL_TABLET | Freq: Two times a day (BID) | ORAL | 0 refills | Status: AC
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
## 2022-04-03 ENCOUNTER — Other Ambulatory Visit: Payer: Self-pay | Admitting: Family Medicine

## 2022-04-03 LAB — COPPER, SERUM: Copper: 167 ug/dL — ABNORMAL HIGH (ref 80–158)

## 2022-04-03 LAB — ZINC: Zinc: 85 ug/dL (ref 44–115)

## 2022-04-06 ENCOUNTER — Other Ambulatory Visit: Payer: Self-pay | Admitting: Family Medicine

## 2022-04-06 DIAGNOSIS — F3342 Major depressive disorder, recurrent, in full remission: Secondary | ICD-10-CM

## 2022-04-06 DIAGNOSIS — G8929 Other chronic pain: Secondary | ICD-10-CM

## 2022-04-06 DIAGNOSIS — B372 Candidiasis of skin and nail: Secondary | ICD-10-CM

## 2022-04-06 DIAGNOSIS — F411 Generalized anxiety disorder: Secondary | ICD-10-CM

## 2022-04-09 LAB — MULTIPLE MYELOMA PANEL, SERUM
Albumin SerPl Elph-Mcnc: 3 g/dL (ref 2.9–4.4)
Albumin/Glob SerPl: 0.9 (ref 0.7–1.7)
Alpha 1: 0.4 g/dL (ref 0.0–0.4)
Alpha2 Glob SerPl Elph-Mcnc: 1.1 g/dL — ABNORMAL HIGH (ref 0.4–1.0)
B-Globulin SerPl Elph-Mcnc: 0.9 g/dL (ref 0.7–1.3)
Gamma Glob SerPl Elph-Mcnc: 1.1 g/dL (ref 0.4–1.8)
Globulin, Total: 3.4 g/dL (ref 2.2–3.9)
IgA: 160 mg/dL (ref 87–352)
IgG (Immunoglobin G), Serum: 1067 mg/dL (ref 586–1602)
IgM (Immunoglobulin M), Srm: 146 mg/dL (ref 26–217)
Total Protein ELP: 6.4 g/dL (ref 6.0–8.5)

## 2022-04-16 ENCOUNTER — Other Ambulatory Visit: Payer: Self-pay | Admitting: Internal Medicine

## 2022-04-16 ENCOUNTER — Encounter (HOSPITAL_BASED_OUTPATIENT_CLINIC_OR_DEPARTMENT_OTHER): Payer: Medicaid Other | Attending: General Surgery | Admitting: General Surgery

## 2022-04-16 DIAGNOSIS — N368 Other specified disorders of urethra: Secondary | ICD-10-CM

## 2022-04-16 DIAGNOSIS — Z87891 Personal history of nicotine dependence: Secondary | ICD-10-CM | POA: Insufficient documentation

## 2022-04-16 DIAGNOSIS — L89154 Pressure ulcer of sacral region, stage 4: Secondary | ICD-10-CM | POA: Insufficient documentation

## 2022-04-16 DIAGNOSIS — G8222 Paraplegia, incomplete: Secondary | ICD-10-CM | POA: Insufficient documentation
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
## 2022-04-17 NOTE — Progress Notes (Signed)
KRISLEY, HEWGLEY [name] ([id number]) [id number] Page 1 of 11 Visit Report for 04/16/2022 Arrival Information Details Patient Name: Date of Service: [name], [name] 04/16/2022 1:15 PM Medical Record Number: [id number] Patient Account Number: [id number] Date of Birth/Sex: Treating RN: 05-02-1980 (42 y.o. [name] Primary Care Ezma Rehm: Ronnie Doss Other Clinician: Referring Kati Riggenbach: Treating Saralyn Jamie/Extender: Lizbeth Bark in Treatment: 82 Visit Information History Since Last Visit Added or deleted any medications: No Patient Arrived: Wheel Chair Any new allergies or adverse reactions: No Arrival Time: 13:44 Had [name] fall or experienced change in No Accompanied By: caregiver activities of daily living that may affect Transfer Assistance: Harrel Lemon Lift risk of falls: Patient Identification Verified: Yes Signs or symptoms of abuse/neglect since last visito No Secondary Verification Process Completed: Yes Hospitalized since last visit: No Patient Requires Transmission-Based Precautions: No Implantable device outside of the clinic excluding No Patient Has Alerts: No cellular tissue based products placed in the center since last visit: Has Dressing in Place as Prescribed: Yes Pain Present Now: Yes Electronic Signature(s) Signed: 04/16/2022 4:48:58 PM By: Baruch Gouty RN, BSN Entered By: Baruch Gouty on 04/16/2022 13:46:56 -------------------------------------------------------------------------------- Clinic Level of Care Assessment Details Patient Name: Date of Service: [name], [name] 04/16/2022 1:15 PM Medical Record Number: [id number] Patient Account Number: [id number] Date of Birth/Sex: Treating RN: 1980-07-09 (42 y.o. [name] Primary Care Drucella Karbowski: Ronnie Doss Other Clinician: Referring Chaquana Nichols: Treating Mehtab Dolberry/Extender: Lizbeth Bark in Treatment:  [facility] Level of Care Assessment Items TOOL 4 Quantity Score []$  - 0 Use when only an EandM is performed on FOLLOW-UP visit ASSESSMENTS - Nursing Assessment / Reassessment X- 1 10 Reassessment of Co-morbidities (includes updates in patient status) X- 1 5 Reassessment of Adherence to Treatment Plan ASSESSMENTS - Wound and Skin [name] ssessment / Reassessment []$  - 0 Simple Wound Assessment / Reassessment - one wound X- 2 5 Complex Wound Assessment / Reassessment - multiple wounds []$  - 0 Dermatologic / Skin Assessment (not related to wound area) ASSESSMENTS - Focused Assessment []$  - 0 Circumferential Edema Measurements - multi extremities []$  - 0 Nutritional Assessment / Counseling / Intervention RAEDEN, BATTIE [name] ([id number]) [phone].pdf Page 2 of 11 X- 1 5 Lower Extremity Assessment (monofilament, tuning fork, pulses) []$  - 0 Peripheral Arterial Disease Assessment (using hand held doppler) ASSESSMENTS - Ostomy and/or Continence Assessment and Care []$  - 0 Incontinence Assessment and Management []$  - 0 Ostomy Care Assessment and Management (repouching, etc.) PROCESS - Coordination of Care X - Simple Patient / Family Education for ongoing care 1 15 []$  - 0 Complex (extensive) Patient / Family Education for ongoing care X- 1 10 Staff obtains Programmer, systems, Records, T Results / Process Orders est []$  - 0 Staff telephones HHA, Nursing Homes / Clarify orders / etc []$  - 0 Routine Transfer to another Facility (non-emergent condition) []$  - 0 Routine Hospital Admission (non-emergent condition) []$  - 0 New Admissions / Biomedical engineer / Ordering NPWT Apligraf, etc. , []$  - 0 Emergency Hospital Admission (emergent condition) X- 1 10 Simple Discharge Coordination []$  - 0 Complex (extensive) Discharge Coordination PROCESS - Special Needs []$  - 0 Pediatric / Minor Patient Management []$  - 0 Isolation Patient Management []$  - 0 Hearing / Language / Visual  special needs []$  - 0 Assessment of Community assistance (transportation, D/C planning, etc.) []$  - 0 Additional assistance / Altered mentation []$  - 0 Support Surface(s) Assessment (bed, cushion, seat, etc.) INTERVENTIONS - Wound Cleansing / Measurement X - Simple Wound  Cleansing - one wound 1 5 []$  - 0 Complex Wound Cleansing - multiple wounds X- 1 5 Wound Imaging (photographs - any number of wounds) []$  - 0 Wound Tracing (instead of photographs) X- 1 5 Simple Wound Measurement - one wound []$  - 0 Complex Wound Measurement - multiple wounds INTERVENTIONS - Wound Dressings X - Small Wound Dressing one or multiple wounds 1 10 []$  - 0 Medium Wound Dressing one or multiple wounds []$  - 0 Large Wound Dressing one or multiple wounds []$  - 0 Application of Medications - topical []$  - 0 Application of Medications - injection INTERVENTIONS - Miscellaneous []$  - 0 External ear exam []$  - 0 Specimen Collection (cultures, biopsies, blood, body fluids, etc.) []$  - 0 Specimen(s) / Culture(s) sent or taken to Lab for analysis X- 1 10 Patient Transfer (multiple staff / Civil Service fast streamer / Similar devices) []$  - 0 Simple Staple / Suture removal (25 or less) []$  - 0 Complex Staple / Suture removal (26 or more) []$  - 0 Hypo / Hyperglycemic Management (close monitor of Blood Glucose) Fikes, Ashey [name] ([id number].pdf Page 3 of 11 []$  - 0 Ankle / Brachial Index (ABI) - do not check if billed separately X- 1 5 Vital Signs Has the patient been seen at the hospital within the last three years: Yes Total Score: 105 Level Of Care: New/Established - Level 3 Electronic Signature(s) Signed: 04/16/2022 4:48:58 PM By: Baruch Gouty RN, BSN Entered By: Baruch Gouty on 04/16/2022 14:28:11 -------------------------------------------------------------------------------- Encounter Discharge Information Details Patient Name: Date of Service: [name]. 04/16/2022 1:15  PM Medical Record Number: [id number] Patient Account Number: [id number] Date of Birth/Sex: Treating RN: [birth year] (42 y.o. [name] Primary Care Xavior Niazi: Ronnie Doss Other Clinician: Referring Amiyah Shryock: Treating Glennie Bose/Extender: Lizbeth Bark in Treatment: [phone] Encounter Discharge Information Items Discharge Condition: Stable Ambulatory Status: Wheelchair Discharge Destination: Home Transportation: Other Accompanied By: caregiver Schedule Follow-up Appointment: Yes Clinical Summary of Care: Patient Declined Notes transportation service Electronic Signature(s) Signed: 04/16/2022 4:48:58 PM By: Baruch Gouty RN, BSN Entered By: Baruch Gouty on 04/16/2022 14:29:06 -------------------------------------------------------------------------------- Lower Extremity Assessment Details Patient Name: Date of Service: [name], [name]. 04/16/2022 1:15 PM Medical Record Number: [id number] Patient Account Number: [id number] Date of Birth/Sex: Treating RN: 02/11/1981 (42 y.o. [name] Primary Care Naftula Donahue: Ronnie Doss Other Clinician: Referring Lakin Romer: Treating Rollyn Scialdone/Extender: Virgel Gess Weeks in Treatment: 302 Edema Assessment Assessed: [Left: No] [Right: No] Edema: [Left: Ye] [Right: s] Vascular Assessment Pulses: Dorsalis Pedis Palpable: [Left:Yes] Electronic Signature(s) Vogel, Noa [name] ([id number]) [phone].pdf Page 4 of 11 Signed: 04/16/2022 4:48:58 PM By: Baruch Gouty RN, BSN Entered By: Baruch Gouty on 04/16/2022 14:04:21 -------------------------------------------------------------------------------- Multi Wound Chart Details Patient Name: Date of Service: [name], [name]. 04/16/2022 1:15 PM Medical Record Number: [id number] Patient Account Number: [id number] Date of Birth/Sex: Treating RN: 02-May-1980 (42 y.o. F) Primary Care [name] [name]:  Ronnie Doss Other Clinician: Referring Zivah Mayr: Treating Nole Robey/Extender: Virgel Gess Weeks in Treatment: 302 Vital Signs Height(in): 65 Pulse(bpm): 85 Weight(lbs): 201 Blood Pressure(mmHg): 120/83 Body Mass Index(BMI): 33.4 Temperature(F): 97.9 Respiratory Rate(breaths/min): 18 [1:Photos: No Photos Medial Sacrum Wound Location: Pressure Injury Wounding Event: Pressure Ulcer Primary Etiology: Anemia, Osteomyelitis Comorbid History: 05/15/2016 Date Acquired: 302 Weeks of Treatment: Open Wound Status: No Wound Recurrence: 2x1.2x1.6  Measurements L x W x D (cm) 1.885 [name] (cm) : rea 3.016 Volume (cm) : 94.30% % Reduction in [name] rea: 97.90% % Reduction in Volume: 1 Starting Position 1 (o'clock):  4 Ending Position 1 (o'clock): 2.2 Maximum Distance 1 (cm): Yes Undermining: Category/Stage  IV Classification: Medium Exudate [name] mount: Serosanguineous Exudate Type: red, brown Exudate Color: Distinct, outline attached Wound Margin: Medium (34-66%) Granulation [name] mount: Red, Pink Granulation Quality: Medium (34-66%) Necrotic [name] mount: Fat Layer  (Subcutaneous Tissue): Yes Fascia: No Exposed Structures: Fascia: No Tendon: No Muscle: No Joint: No Bone: No None Epithelialization: Rash: No Periwound Skin Texture: Dry/Scaly: Yes Periwound Skin Moisture: No Abnormalities Noted Periwound Skin Color: No  Abnormality Temperature:] [17:No Photos Left Calcaneus Pressure Injury Pressure Ulcer Anemia, Osteomyelitis /06/2022 Open No 0x0x0 0 0 00.00% 00.00% No Category/Stage I None Present N/[name] N/[name] N/[name] None Present (0%) N/[name] None Present (0%) Fat Layer  (Subcutaneous Tissue): No Tendon: No Muscle: No Joint: No Bone: No Large (67-100%) Callus: Yes Excoriation: No Induration: No Crepitus: No Rash: No Scarring: No Maceration: No Dry/Scaly: No Atrophie Blanche: No Cyanosis: No Ecchymosis: No Erythema: No  Hemosiderin Staining: No Mottled: No Pallor: No Rubor: No No Abnormality] [N/[name]:N/[name] N/[name] N/[name] N/[name] N/[name]  N/[name] N/[name] N/[name] N/[name] N/[name] N/[name] N/[name] N/[name] N/[name] N/[name] N/[name] N/[name] N/[name] N/[name] N/[name] N/[name] N/[name] N/[name] N/[name] N/[name] N/[name] N/[name] N/[name] N/[name]] [name], [name] ([id number]) [1:Treatment Notes Wound #1 (Sacrum) Wound Laterality: Medial Cleanser Soap and Water Discharge Instruction: May shower and wash wound with dial antibacterial soap and water prior to dressing change. Anasept Antimicrobial Skin and Wound Cleanser, 8 (oz)  Discharge Instruction: Cleanse the wound with Anasept cleanser prior to applying [name] clean dressing using gauze sponges, not tissue or cotton balls.] [N/[name]:[id number] Page 5 of 11] Peri-Wound Care Zinc Oxide Ointment 30g tube Discharge Instruction: Apply Zinc Oxide to periwound as needed for moisture Topical Primary Dressing [place] Discharge Instruction: moisten gauze with Vashe and pack into wound Secondary Dressing Zetuvit Plus Silicone Border Dressing 5x5 (in/in) Discharge Instruction: Apply silicone border over primary dressing as directed. Secured With Compression Wrap Compression Stockings Add-Ons Wound #17 (Calcaneus) Wound Laterality: Left Cleanser Peri-Wound Care Topical Primary Dressing Secondary Dressing Secured With Compression Wrap Compression Stockings Add-Ons Electronic Signature(s) Signed: 04/16/2022 2:31:31 PM By: Fredirick Maudlin MD FACS Entered By: Fredirick Maudlin on 04/16/2022 14:31:30 -------------------------------------------------------------------------------- Multi-Disciplinary Care Plan Details Patient Name: Date of Service: TAKASHA, BRAVERMAN [name]. 04/16/2022 1:15 PM Medical Record Number: [id number] Patient Account Number: [id number] Date of Birth/Sex: Treating RN: 1981/02/10 (41 y.o. [name] Primary Care Mayci Haning: Ronnie Doss Other Clinician: Referring Julina Altmann: Treating Maccoy Haubner/Extender: Virgel Gess Weeks in Treatment: [place] reviewed with physician LEONOR, MCNEFF [name] ([id number])  [id number] Page 6 of 11 Active Inactive Pressure Nursing Diagnoses: Knowledge deficit related to management of pressures ulcers Potential for impaired tissue integrity related to pressure, friction, moisture, and shear Goals: Patient will remain free from development of additional pressure ulcers Date Initiated: 06/27/2016 Date Inactivated: 01/17/2019 Target Resolution Date: 12/24/2018 Goal Status: Met Patient/caregiver will verbalize risk factors for pressure ulcer development Date Initiated: 06/27/2016 Date Inactivated: 07/08/2017 Target Resolution Date: 07/08/2017 Goal Status: Met Patient/caregiver will verbalize understanding of pressure ulcer management Date Initiated: 06/27/2016 Target Resolution Date: 07/01/2022 Goal Status: Active Interventions: Assess: immobility, friction, shearing, incontinence upon admission and as needed Assess offloading mechanisms upon admission and as needed Assess potential for pressure ulcer upon admission and as needed Provide education on pressure ulcers Treatment Activities: Patient referred for pressure reduction/relief devices : 06/27/2016 Pressure reduction/relief device ordered : 06/27/2016 Notes: Wound/Skin Impairment Nursing Diagnoses: Impaired tissue integrity Knowledge deficit related to smoking impact on wound healing Knowledge deficit related  to ulceration/compromised skin integrity Goals: Patient will demonstrate [name] reduced rate of smoking or cessation of smoking Date Initiated: 06/27/2016 Date Inactivated: 01/26/2017 Target Resolution Date: 01/08/2017 Goal Status: Unmet Unmet Reason: pt continues to smoke Patient/caregiver will verbalize understanding of skin care regimen Date Initiated: 06/27/2016 Target Resolution Date: 07/01/2022 Goal Status: Active Ulcer/skin breakdown will have [name] volume reduction of 50% by week 8 Date Initiated: 06/27/2016 Date Inactivated: 12/11/2016 Target Resolution Date:  07/25/2016 Goal Status: Met Interventions: Assess patient/caregiver ability to obtain necessary supplies Assess patient/caregiver ability to perform ulcer/skin care regimen upon admission and as needed Assess ulceration(s) every visit Provide education on smoking Provide education on ulcer and skin care Treatment Activities: Skin care regimen initiated : 06/27/2016 Topical wound management initiated : 06/27/2016 Notes: Electronic Signature(s) Signed: 04/16/2022 4:48:58 PM By: Baruch Gouty RN, BSN Entered By: Baruch Gouty on 04/16/2022 14:11:40 [name] ([id number]) [id number].pdf Page 7 of 11 -------------------------------------------------------------------------------- Pain Assessment Details Patient Name: Date of Service: [name], [name] 04/16/2022 1:15 PM Medical Record Number: [id number] Patient Account Number: [id number] Date of Birth/Sex: Treating RN: 1980/09/14 (42 y.o. [name] Primary Care Alanie Syler: Ronnie Doss Other Clinician: Referring Kateria Cutrona: Treating Taydem Cavagnaro/Extender: Virgel Gess Weeks in Treatment: [id number] Active Problems Location of Pain Severity and Description of Pain Patient Has Paino Yes Site Locations Pain Location: Pain in Ulcers With Dressing Change: No Duration of the Pain. Constant / Intermittento Intermittent Rate the pain. Current Pain Level: 5 Character of Pain Describe the Pain: Aching Pain Management and Medication Current Pain Management: Medication: Yes Other: reposition Is the Current Pain Management Adequate: Adequate How does your wound impact your activities of daily livingo Sleep: No Bathing: No Appetite: No Relationship With Others: No Bladder Continence: No Emotions: No Bowel Continence: No Work: No Toileting: No Drive: No Dressing: No Hobbies: No Electronic Signature(s) Signed: 04/16/2022 4:48:58 PM By: Baruch Gouty RN, BSN Entered By:  Baruch Gouty on 04/16/2022 13:48:08 -------------------------------------------------------------------------------- Patient/Caregiver Education Details Patient Name: Date of Service: Karlton Lemon 2/14/2024andnbsp1:15 PM Medical Record Number: [id number] Patient Account Number: [id number] Date of Birth/Gender: Treating RN: 1980-07-29 (42 y.o. [name] Primary Care Physician: Ronnie Doss Other Clinician: Referring Physician: Treating Physician/Extender: Lizbeth Bark in Treatment: [phone] Education Assessment Education Provided To: [name], [name] ([id number]) [id number] Page 8 of 11 Patient Education Topics Provided Pressure: Methods: Explain/Verbal Responses: Reinforcements needed, State content correctly Wound/Skin Impairment: Methods: Explain/Verbal Responses: Reinforcements needed, State content correctly Electronic Signature(s) Signed: 04/16/2022 4:48:58 PM By: Baruch Gouty RN, BSN Entered By: Baruch Gouty on 04/16/2022 14:12:14 -------------------------------------------------------------------------------- Wound Assessment Details Patient Name: Date of Service: [name], [name]. 04/16/2022 1:15 PM Medical Record Number: [id number] Patient Account Number: [id number] Date of Birth/Sex: Treating RN: 1980-10-30 (42 y.o. [name] Primary Care Tiegan Jambor: Ronnie Doss Other Clinician: Referring Kynadee Dam: Treating Alfonso Carden/Extender: Virgel Gess Weeks in Treatment: 302 Wound Status Wound Number: 1 Primary Etiology: Pressure Ulcer Wound Location: Medial Sacrum Wound Status: Open Wounding Event: Pressure Injury Comorbid History: Anemia, Osteomyelitis Date Acquired: 05/15/2016 Weeks Of Treatment: 302 Clustered Wound: No Photos Wound Measurements Length: (cm) 2 Width: (cm) 1.2 Depth: (cm) 1.6 Area: (cm) 1.885 Volume: (cm) 3.016 % Reduction in  Area: 94.3% % Reduction in Volume: 97.9% Epithelialization: None Undermining: Yes Starting Position (o'clock): 1 Ending Position (o'clock): 4 Maximum Distance: (cm) 2.2 Wound Description Classification: Category/Stage IV Wound Margin: Distinct, outline attached Exudate Amount: Medium Exudate Type: Serosanguineous Exudate Color: red, brown Foul Odor After Cleansing: No  Slough/Fibrino Yes Wound Bed DRUSCILLA, OROSCO [name] ([id number]) [phone].pdf Page 9 of 11 Granulation Amount: Medium (34-66%) Exposed Structure Granulation Quality: Red, Pink Fascia Exposed: No Necrotic Amount: Medium (34-66%) Fat Layer (Subcutaneous Tissue) Exposed: Yes Necrotic Quality: Adherent Slough Tendon Exposed: No Muscle Exposed: No Joint Exposed: No Bone Exposed: No Periwound Skin Texture Texture Color No Abnormalities Noted: Yes No Abnormalities Noted: Yes Moisture Temperature / Pain No Abnormalities Noted: Yes Temperature: No Abnormality Treatment Notes Wound #1 (Sacrum) Wound Laterality: Medial Cleanser Soap and Water Discharge Instruction: May shower and wash wound with dial antibacterial soap and water prior to dressing change. Anasept Antimicrobial Skin and Wound Cleanser, 8 (oz) Discharge Instruction: Cleanse the wound with Anasept cleanser prior to applying [name] clean dressing using gauze sponges, not tissue or cotton balls. Peri-Wound Care Zinc Oxide Ointment 30g tube Discharge Instruction: Apply Zinc Oxide to periwound as needed for moisture Topical Primary Dressing VASHE Discharge Instruction: moisten gauze with Vashe and pack into wound Secondary Dressing Zetuvit Plus Silicone Border Dressing 5x5 (in/in) Discharge Instruction: Apply silicone border over primary dressing as directed. Secured With Compression Wrap Compression Stockings Environmental education officer) Signed: 04/16/2022 4:48:58 PM By: Baruch Gouty RN, BSN Entered By: Baruch Gouty on  04/16/2022 14:32:19 -------------------------------------------------------------------------------- Wound Assessment Details Patient Name: Date of Service: [name], [name]. 04/16/2022 1:15 PM Medical Record Number: [id number] Patient Account Number: [id number] Date of Birth/Sex: Treating RN: 08-15-1980 (42 y.o. [name] Primary Care Sativa Gelles: Ronnie Doss Other Clinician: Referring Billie Trager: Treating Eilyn Polack/Extender: Virgel Gess Weeks in Treatment: [place] Wound Status Wound Number: 17 Primary Etiology: Pressure Ulcer Wound Location: Left Calcaneus Wound Status: Healed - Epithelialized Wounding Event: Pressure Injury Comorbid History: Anemia, Osteomyelitis Date Acquired: 03/06/2022 Weeks Of Treatment: 4 Clustered Wound: No [name], [name] ([id number]) [id number].pdf Page 10 of 11 Photos Wound Measurements Length: (cm) Width: (cm) Depth: (cm) Area: (cm) Volume: (cm) 0 % Reduction in Area: 100% 0 % Reduction in Volume: 100% 0 Epithelialization: Large (67-100%) 0 Tunneling: No 0 Undermining: No Wound Description Classification: Category/Stage I Exudate Amount: None Present Foul Odor After Cleansing: No Slough/Fibrino No Wound Bed Granulation Amount: None Present (0%) Exposed Structure Necrotic Amount: None Present (0%) Fascia Exposed: No Fat Layer (Subcutaneous Tissue) Exposed: No Tendon Exposed: No Muscle Exposed: No Joint Exposed: No Bone Exposed: No Periwound Skin Texture Texture Color No Abnormalities Noted: No No Abnormalities Noted: Yes Callus: Yes Temperature / Pain Crepitus: No Temperature: No Abnormality Excoriation: No Induration: No Rash: No Scarring: No Moisture No Abnormalities Noted: Yes Treatment Notes Wound #17 (Calcaneus) Wound Laterality: Left Cleanser Peri-Wound Care Topical Primary Dressing Secondary Dressing Secured With Compression Wrap Compression  Stockings Add-Ons Electronic Signature(s) Signed: 04/16/2022 4:48:58 PM By: Baruch Gouty RN, BSN Entered By: Baruch Gouty on 04/16/2022 14:32:48 [name] ([id number] Page 11 of 11 -------------------------------------------------------------------------------- Vitals Details Patient Name: Date of Service: [name], [name] 04/16/2022 1:15 PM Medical Record Number: [id number] Patient Account Number: [id number] Date of Birth/Sex: Treating RN: [birth year] (43 y.o. [name] Primary Care Merin Borjon: Ronnie Doss Other Clinician: Referring Keniya Schlotterbeck: Treating Cassy Sprowl/Extender: Virgel Gess Weeks in Treatment: 302 Vital Signs Time Taken: 13:46 Temperature (F): 97.9 Height (in): 65 Pulse (bpm): 85 Weight (lbs): 201 Respiratory Rate (breaths/min): 18 Body Mass Index (BMI): 33.4 Blood Pressure (mmHg): 120/83 Reference Range: 80 - 120 mg / dl Electronic Signature(s) Signed: 04/16/2022 4:48:58 PM By: Baruch Gouty RN, BSN Entered By: Baruch Gouty on 04/16/2022 13:47:21

## 2022-04-25 ENCOUNTER — Inpatient Hospital Stay: Payer: Medicaid Other

## 2022-04-25 ENCOUNTER — Inpatient Hospital Stay: Payer: Medicaid Other | Admitting: Oncology

## 2022-04-28 ENCOUNTER — Telehealth: Payer: Self-pay

## 2022-04-28 NOTE — Transitions of Care (Post Inpatient/ED Visit) (Signed)
   04/28/2022  Name: [name] MRN: [id number] DOB: 04-Apr-1980  Today's TOC FU Call Status: Today's TOC FU Call Status:: Successful TOC FU Call Competed TOC FU Call Complete Date: 04/28/22  Transition Care Management Follow-up Telephone Call Date of Discharge: 04/26/22 Discharge Facility: Other ([place]) Name of Other (Non-[facility]) Discharge Facility: [facility] Type of Discharge: Inpatient Admission Primary Inpatient Discharge Diagnosis:: convulsions How have you been since you were released from the hospital?: Better Any questions or concerns?: No  Items Reviewed: Did you receive and understand the discharge instructions provided?: Yes Medications obtained and verified?: Yes (Medications Reviewed) Any new allergies since your discharge?: No Dietary orders reviewed?: NA Do you have support at home?: Yes People in Home: parent(s)  Home Care and Equipment/Supplies: [place] Ordered?: NA Any new equipment or medical supplies ordered?: NA  Functional Questionnaire: Do you need assistance with bathing/showering or dressing?: No Do you need assistance with meal preparation?: No Do you need assistance with eating?: No Do you have difficulty maintaining continence: No Do you need assistance with getting out of bed/getting out of a chair/moving?: No Do you have difficulty managing or taking your medications?: No  Folllow up appointments reviewed: PCP Follow-up appointment confirmed?: NA Specialist Hospital Follow-up appointment confirmed?: No Reason Specialist Follow-Up Not Confirmed: Patient has Specialist Provider Number and will Call for Appointment Do you need transportation to your follow-up appointment?: No Do you understand care options if your condition(s) worsen?: Yes-patient verbalized understanding    Darke, Harvey Nurse Health Advisor Direct Dial [phone]

## 2022-04-29 ENCOUNTER — Encounter (HOSPITAL_COMMUNITY): Payer: Self-pay | Admitting: Student

## 2022-04-29 ENCOUNTER — Encounter: Payer: Self-pay | Admitting: Student

## 2022-04-29 ENCOUNTER — Other Ambulatory Visit (HOSPITAL_COMMUNITY): Payer: Self-pay | Admitting: Student

## 2022-04-29 DIAGNOSIS — R569 Unspecified convulsions: Secondary | ICD-10-CM

## 2022-05-07 ENCOUNTER — Telehealth: Payer: Self-pay | Admitting: Family Medicine

## 2022-05-07 NOTE — Telephone Encounter (Signed)
Per 3/5 IB reached out to patient to schedule, spoke with patients mother, confirmed time and date of appointment.

## 2022-05-14 ENCOUNTER — Encounter (HOSPITAL_BASED_OUTPATIENT_CLINIC_OR_DEPARTMENT_OTHER): Payer: Medicaid Other | Admitting: Internal Medicine

## 2022-05-22 ENCOUNTER — Encounter (HOSPITAL_BASED_OUTPATIENT_CLINIC_OR_DEPARTMENT_OTHER): Payer: Medicaid Other | Attending: General Surgery | Admitting: General Surgery

## 2022-05-22 ENCOUNTER — Other Ambulatory Visit: Payer: Self-pay | Admitting: Family Medicine

## 2022-05-22 DIAGNOSIS — Z993 Dependence on wheelchair: Secondary | ICD-10-CM | POA: Insufficient documentation

## 2022-05-22 DIAGNOSIS — M4628 Osteomyelitis of vertebra, sacral and sacrococcygeal region: Secondary | ICD-10-CM | POA: Diagnosis not present

## 2022-05-22 DIAGNOSIS — L89154 Pressure ulcer of sacral region, stage 4: Secondary | ICD-10-CM | POA: Diagnosis present

## 2022-05-22 DIAGNOSIS — Z87891 Personal history of nicotine dependence: Secondary | ICD-10-CM | POA: Diagnosis not present

## 2022-05-22 DIAGNOSIS — F32A Depression, unspecified: Secondary | ICD-10-CM | POA: Diagnosis not present

## 2022-05-22 DIAGNOSIS — F419 Anxiety disorder, unspecified: Secondary | ICD-10-CM | POA: Insufficient documentation

## 2022-05-22 DIAGNOSIS — G8222 Paraplegia, incomplete: Secondary | ICD-10-CM | POA: Diagnosis not present

## 2022-05-23 ENCOUNTER — Other Ambulatory Visit: Payer: Self-pay | Admitting: Family Medicine

## 2022-05-23 MED ORDER — LACOSAMIDE 200 MG PO TABS: 200.0000 mg | ORAL_TABLET | Freq: Two times a day (BID) | ORAL | 0 refills | Status: DC

## 2022-05-23 NOTE — Telephone Encounter (Signed)
I personally spoke to pt's mom.  She will call neuro on Monday to set up f/u visit.  Apparently, med refills got lost in the shuffle when she was sent to hospital for seizures.  Mom was only giving med once daily (vimpat) but is now giving appropriately BID.  I have recommended wean from buproprion.  Once daily 2 weeks, then off, since this lowers seizure threshold.  Please make sure Buproprion is discontinued at her pharmacy so no refills are given going forward.

## 2022-05-23 NOTE — Telephone Encounter (Signed)
Last office visit 03/11/22 Upcoming appointment 06/13/22 Last refill 03/12/22, #60, 1 refill
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
## 2022-05-23 NOTE — Telephone Encounter (Signed)
This is per her neurologist. Please send rx there.

## 2022-05-23 NOTE — Telephone Encounter (Signed)
  Prescription Request  05/23/2022  Is this a "Controlled Substance" medicine? no  Have you seen your PCP in the last 2 weeks? Pt has appt 06/13/22  If YES, route message to pool  -  If NO, patient needs to be scheduled for appointment.  What is the name of the medication or equipment? levETIRAcetam (KEPPRA) 750 MG tablet [id number]  and lacosamide (VIMPAT) 200 MG TABS tablet [id number]  pt will be out of meds Monday  Have you contacted your pharmacy to request a refill? Yes    Which pharmacy would you like this sent to? [facility]   Patient notified that their request is being sent to the clinical staff for review and that they should receive a response within 2 business days.

## 2022-05-27 ENCOUNTER — Ambulatory Visit (HOSPITAL_COMMUNITY)
Admission: RE | Admit: 2022-05-27 | Discharge: 2022-05-27 | Disposition: A | Discharge status: Home / Self Care | Payer: Medicaid Other | Source: Ambulatory Visit | Attending: Student | Admitting: Student

## 2022-05-27 ENCOUNTER — Encounter: Payer: Self-pay | Admitting: Family Medicine

## 2022-05-27 DIAGNOSIS — R569 Unspecified convulsions: Secondary | ICD-10-CM

## 2022-05-30 ENCOUNTER — Other Ambulatory Visit (HOSPITAL_COMMUNITY): Payer: Self-pay | Admitting: Student

## 2022-05-30 DIAGNOSIS — R569 Unspecified convulsions: Secondary | ICD-10-CM

## 2022-06-03 ENCOUNTER — Telehealth: Payer: Self-pay | Admitting: Internal Medicine

## 2022-06-03 NOTE — Telephone Encounter (Signed)
Reached out to patient to schedule 4/2 Ib, left voicemail.

## 2022-06-13 ENCOUNTER — Other Ambulatory Visit: Payer: Medicaid Other

## 2022-06-13 ENCOUNTER — Ambulatory Visit: Payer: Medicaid Other | Admitting: Oncology

## 2022-06-13 ENCOUNTER — Ambulatory Visit (INDEPENDENT_AMBULATORY_CARE_PROVIDER_SITE_OTHER): Payer: Medicaid Other | Admitting: Family Medicine

## 2022-06-13 ENCOUNTER — Encounter: Payer: Self-pay | Admitting: Family Medicine

## 2022-06-13 VITALS — BP 148/102 | HR 82 | Temp 98.8°F | Ht 66.0 in

## 2022-06-13 DIAGNOSIS — R030 Elevated blood-pressure reading, without diagnosis of hypertension: Secondary | ICD-10-CM | POA: Diagnosis not present

## 2022-06-13 DIAGNOSIS — G40909 Epilepsy, unspecified, not intractable, without status epilepticus: Secondary | ICD-10-CM

## 2022-06-13 DIAGNOSIS — G8929 Other chronic pain: Secondary | ICD-10-CM

## 2022-06-13 DIAGNOSIS — T1490XS Injury, unspecified, sequela: Secondary | ICD-10-CM

## 2022-06-13 DIAGNOSIS — S34101D Unspecified injury to L1 level of lumbar spinal cord, subsequent encounter: Secondary | ICD-10-CM

## 2022-06-13 MED ORDER — OXYCODONE HCL 10 MG PO TABS
ORAL_TABLET | ORAL | 0 refills | Status: DC
Start: 2022-07-04 — End: 2022-09-17

## 2022-06-13 MED ORDER — OXYCODONE HCL 10 MG PO TABS
10.0000 mg | ORAL_TABLET | Freq: Three times a day (TID) | ORAL | 0 refills | Status: DC
Start: 2022-09-02 — End: 2022-08-04

## 2022-06-13 MED ORDER — OXYCODONE HCL 10 MG PO TABS
ORAL_TABLET | ORAL | 0 refills | Status: DC
Start: 2022-08-03 — End: 2022-08-04

## 2022-06-13 MED ORDER — LEVETIRACETAM 750 MG PO TABS
1500.0000 mg | ORAL_TABLET | Freq: Two times a day (BID) | ORAL | 0 refills | Status: DC
Start: 2022-06-13 — End: 2022-10-10

## 2022-06-13 MED ORDER — LACOSAMIDE 200 MG PO TABS: 200.0000 mg | ORAL_TABLET | Freq: Two times a day (BID) | ORAL | 1 refills | Status: DC

## 2022-06-13 MED ORDER — KETOROLAC TROMETHAMINE 30 MG/ML IJ SOLN
30.0000 mg | Freq: Once | INTRAMUSCULAR | Status: AC
Start: 2022-06-13 — End: 2022-06-13
  Administered 2022-06-13: 30 mg via INTRAMUSCULAR
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
## 2022-06-19 ENCOUNTER — Encounter (HOSPITAL_BASED_OUTPATIENT_CLINIC_OR_DEPARTMENT_OTHER): Payer: Medicaid Other | Attending: General Surgery | Admitting: General Surgery

## 2022-06-19 DIAGNOSIS — Z87891 Personal history of nicotine dependence: Secondary | ICD-10-CM | POA: Insufficient documentation

## 2022-06-19 DIAGNOSIS — L89154 Pressure ulcer of sacral region, stage 4: Secondary | ICD-10-CM | POA: Diagnosis present

## 2022-06-19 DIAGNOSIS — G8222 Paraplegia, incomplete: Secondary | ICD-10-CM | POA: Diagnosis not present

## 2022-06-19 NOTE — Progress Notes (Signed)
DAYSY, SANTINI A ([id number]) [id number] Page 1 of 7 Visit Report for 06/19/2022 Arrival Information Details Patient Name: Date of Service: [name], [name] 06/19/2022 1:15 PM Medical Record Number: [id number] Patient Account Number: [id number] Date of Birth/Sex: Treating RN: [birth year] (42 y.o. [name] Standard Primary Care Keighley Deckman: Delynn Flavin Other Clinician: Referring Binyomin Brann: Treating Tycen Dockter/Extender: [name] in Treatment: 311 Visit Information History Since Last Visit All ordered tests and consults were completed: No Patient Arrived: Wheel Chair Added or deleted any medications: No Arrival Time: 13:13 Any new allergies or adverse reactions: No Accompanied By: aide Had a fall or experienced change in No Transfer Assistance: Nurse, adult activities of daily living that may affect Patient Identification Verified: Yes risk of falls: Secondary Verification Process Completed: Yes Signs or symptoms of abuse/neglect since last visito No Patient Requires Transmission-Based Precautions: No Hospitalized since last visit: No Patient Has Alerts: No Implantable device outside of the clinic excluding No cellular tissue based products placed in the center since last visit: Pain Present Now: No Electronic Signature(s) Signed: 06/19/2022 3:37:53 PM By: Dayton Scrape Entered By: Dayton Scrape on 06/19/2022 13:13:32 -------------------------------------------------------------------------------- Encounter Discharge Information Details Patient Name: Date of Service: [name], Jamie A. 06/19/2022 1:15 PM Medical Record Number: [id number] Patient Account Number: [id number] Date of Birth/Sex: Treating RN: October 04, 1980 (42 y.o. [name] Standard Primary Care Dilyn Osoria: Delynn Flavin Other Clinician: Referring Shaquna Geigle: Treating Jamiria Langill/Extender: [name] in Treatment: [phone] Encounter  Discharge Information Items Post Procedure Vitals Discharge Condition: Stable Temperature (F): 98.3 Ambulatory Status: Wheelchair Pulse (bpm): 90 Discharge Destination: Home Respiratory Rate (breaths/min): 18 Transportation: Private Auto Blood Pressure (mmHg): 118/79 Accompanied By: caregiver Schedule Follow-up Appointment: Yes Clinical Summary of Care: Patient Declined Electronic Signature(s) Signed: 06/19/2022 3:36:20 PM By: Zenaida Deed RN, BSN Entered By: Zenaida Deed on 06/19/2022 14:06:12 -------------------------------------------------------------------------------- Lower Extremity Assessment Details Patient Name: Date of Service: [name], Jamie A. 06/19/2022 1:15 PM Medical Record Number: [id number] Patient Account Number: [id number] Date of Birth/Sex: Treating RN: Mar 28, 1980 (42 y.o. [name] Primary Care Amneet Cendejas: Delynn Flavin Other Clinician: Referring Krystyn Picking: Treating Coalton Arch/Extender: [name] Weeks in Treatment: 311 Electronic Signature(s) Signed: 06/19/2022 3:33:44 PM By: Gwyndolyn Kaufman, Francena A ([id number]) [id number] Page 2 of 7 Entered By: Samuella Bruin on 06/19/2022 13:39:38 -------------------------------------------------------------------------------- Multi Wound Chart Details Patient Name: Date of Service: [name], [name] 06/19/2022 1:15 PM Medical Record Number: [id number] Patient Account Number: [id number] Date of Birth/Sex: Treating RN: 01-Apr-1980 (42 y.o. [name] Standard Primary Care Kenyatta Gloeckner: Delynn Flavin Other Clinician: Referring Khloey Chern: Treating Jasilyn Holderman/Extender: [name] Weeks in Treatment: 311 Vital Signs Height(in): 65 Pulse(bpm): 90 Weight(lbs): 201 Blood Pressure(mmHg): 118/79 Body Mass Index(BMI): 33.4 Temperature(F): 98.3 Respiratory Rate(breaths/min): 20 [1:Photos:] [N/A:N/A] Medial Sacrum  N/A N/A Wound Location: Pressure Injury N/A N/A Wounding Event: Pressure Ulcer N/A N/A Primary Etiology: Anemia, Osteomyelitis N/A N/A Comorbid History: 05/15/2016 N/A N/A Date Acquired: 311 N/A N/A Weeks of Treatment: Open N/A N/A Wound Status: No N/A N/A Wound Recurrence: 1.8x1x1.3 N/A N/A Measurements L x W x D (cm) 1.414 N/A N/A A (cm) : rea 1.838 N/A N/A Volume (cm) : 95.70% N/A N/A % Reduction in A rea: 98.70% N/A N/A % Reduction in Volume: 2 Starting Position 1 (o'clock): 4 Ending Position 1 (o'clock): 1.7 Maximum Distance 1 (cm): Yes N/A N/A Undermining: Category/Stage IV N/A N/A Classification: Medium N/A N/A Exudate A mount: Serosanguineous N/A N/A Exudate Type: red, brown N/A N/A Exudate Color: Distinct, outline  attached N/A N/A Wound Margin: Large (67-100%) N/A N/A Granulation A mount: Red, Pink N/A N/A Granulation Quality: Small (1-33%) N/A N/A Necrotic A mount: Fat Layer (Subcutaneous Tissue): Yes N/A N/A Exposed Structures: Fascia: No Tendon: No Muscle: No Joint: No Bone: No Small (1-33%) N/A N/A Epithelialization: Debridement - Selective/Open Wound N/A N/A Debridement: Pre-procedure Verification/Time Out 13:45 N/A N/A Taken: Slough N/A N/A Tissue Debrided: Skin/Epidermis N/A N/A Level: 1.8 N/A N/A Debridement A (sq cm): rea Curette N/A N/A Instrument: Minimum N/A N/A Bleeding: Pressure N/A N/A Hemostasis A chieved: 0 N/A N/A Procedural Pain: 0 N/A N/A Post Procedural Pain: Procedure was tolerated well N/A N/A Debridement Treatment Response: 1.8x1x1.3 N/A N/A Post Debridement Measurements L x W x D (cm) 1.838 N/A N/A Post Debridement Volume: (cm) Category/Stage IV N/A N/A Post Debridement StageRHYLI, DEPAULA A ([id number]) [id number] Page 3 of 7 Rash: No N/A N/A Periwound Skin Texture: Dry/Scaly: Yes N/A N/A Periwound Skin Moisture: No Abnormalities Noted N/A N/A Periwound  Skin Color: No Abnormality N/A N/A Temperature: Debridement N/A N/A Procedures Performed: Treatment Notes Wound #1 (Sacrum) Wound Laterality: Medial Cleanser Soap and Water Discharge Instruction: May shower and wash wound with dial antibacterial soap and water prior to dressing change. Anasept Antimicrobial Skin and Wound Cleanser, 8 (oz) Discharge Instruction: Cleanse the wound with Anasept cleanser prior to applying a clean dressing using gauze sponges, not tissue or cotton balls. Peri-Wound Care Zinc Oxide Ointment 30g tube Discharge Instruction: Apply Zinc Oxide to periwound as needed for moisture Topical Primary Dressing Promogran Prisma Matrix, 4.34 (sq in) (silver collagen) Discharge Instruction: Moisten collagen with saline or hydrogel and lay in bottom of wound bed. VASHE Discharge Instruction: moisten gauze with Vashe and pack into wound Secondary Dressing Zetuvit Plus Silicone Border Dressing 5x5 (in/in) Discharge Instruction: Apply silicone border over primary dressing as directed. Secured With Compression Wrap Compression Stockings Facilities manager) Signed: 06/19/2022 3:15:23 PM By: Duanne Guess MD FACS Signed: 06/19/2022 3:36:20 PM By: Zenaida Deed RN, BSN Entered By: Duanne Guess on 06/19/2022 15:15:22 -------------------------------------------------------------------------------- Multi-Disciplinary Care Plan Details Patient Name: Date of Service: [name], [name] 06/19/2022 1:15 PM Medical Record Number: [id number] Patient Account Number: [id number] Date of Birth/Sex: Treating RN: 25-May-1980 (41 y.o. [name] Standard Primary Care Bless Belshe: Delynn Flavin Other Clinician: Referring Ameriah Lint: Treating Sada Mazzoni/Extender: [name] Weeks in Treatment: 311 Multidisciplinary Care Plan reviewed with physician Active Inactive Pressure Nursing Diagnoses: Knowledge deficit related to management of pressures  ulcers Potential for impaired tissue integrity related to pressure, friction, moisture, and shear Goals: Patient will remain free from development of additional pressure ulcers Date Initiated: 06/27/2016 Date Inactivated: 01/17/2019 Target Resolution Date: 12/24/2018 Goal Status: Met Patient/caregiver will verbalize risk factors for pressure ulcer development Behrman, Joice A ([id number]) [id number] Page 4 of 7 Date Initiated: 06/27/2016 Date Inactivated: 07/08/2017 Target Resolution Date: 07/08/2017 Goal Status: Met Patient/caregiver will verbalize understanding of pressure ulcer management Date Initiated: 06/27/2016 Target Resolution Date: 07/01/2022 Goal Status: Active Interventions: Assess: immobility, friction, shearing, incontinence upon admission and as needed Assess offloading mechanisms upon admission and as needed Assess potential for pressure ulcer upon admission and as needed Provide education on pressure ulcers Treatment Activities: Patient referred for pressure reduction/relief devices : 06/27/2016 Pressure reduction/relief device ordered : 06/27/2016 Notes: Wound/Skin Impairment Nursing Diagnoses: Impaired tissue integrity Knowledge deficit related to smoking impact on wound healing Knowledge deficit related to ulceration/compromised skin integrity Goals: Patient will demonstrate a reduced rate of smoking or cessation of smoking Date Initiated: 06/27/2016 Date  Inactivated: 01/26/2017 Target Resolution Date: 01/08/2017 Goal Status: Unmet Unmet Reason: pt continues to smoke Patient/caregiver will verbalize understanding of skin care regimen Date Initiated: 06/27/2016 Target Resolution Date: 07/01/2022 Goal Status: Active Ulcer/skin breakdown will have a volume reduction of 50% by week 8 Date Initiated: 06/27/2016 Date Inactivated: 12/11/2016 Target Resolution Date: 07/25/2016 Goal Status: Met Interventions: Assess patient/caregiver ability to  obtain necessary supplies Assess patient/caregiver ability to perform ulcer/skin care regimen upon admission and as needed Assess ulceration(s) every visit Provide education on smoking Provide education on ulcer and skin care Treatment Activities: Skin care regimen initiated : 06/27/2016 Topical wound management initiated : 06/27/2016 Notes: Electronic Signature(s) Signed: 06/19/2022 3:36:20 PM By: Zenaida Deed RN, BSN Entered By: Zenaida Deed on 06/19/2022 13:42:58 -------------------------------------------------------------------------------- Pain Assessment Details Patient Name: Date of Service: [name], Jamie A. 06/19/2022 1:15 PM Medical Record Number: [id number] Patient Account Number: [id number] Date of Birth/Sex: Treating RN: 04-Jan-1981 (41 y.o. [name] Standard Primary Care Nijae Doyel: Delynn Flavin Other Clinician: Referring Brandalyn Harting: Treating Linnell Swords/Extender: [name] Weeks in Treatment: 311 Active Problems Location of Pain Severity and Description of Pain Patient Has Paino Yes Site Locations Rate the pain. INFINITY, JEFFORDS A ([id number]) [id number] Page 5 of 7 Rate the pain. Current Pain Level: 4 Worst Pain Level: 10 Least Pain Level: 0 Tolerable Pain Level: 2 Pain Management and Medication Current Pain Management: Electronic Signature(s) Signed: 06/19/2022 3:36:20 PM By: Zenaida Deed RN, BSN Signed: 06/19/2022 3:37:53 PM By: Dayton Scrape Entered By: Dayton Scrape on 06/19/2022 13:14:08 -------------------------------------------------------------------------------- Patient/Caregiver Education Details Patient Name: Date of Service: [name] 4/18/2024andnbsp1:15 PM Medical Record Number: [id number] Patient Account Number: [id number] Date of Birth/Gender: Treating RN: 02-May-1980 (41 y.o. [name] Standard Primary Care Physician: Delynn Flavin Other Clinician: Referring  Physician: Treating Physician/Extender: [name] in Treatment: 311 Education Assessment Education Provided To: Patient Education Topics Provided Pressure: Methods: Explain/Verbal Responses: Reinforcements needed, State content correctly Wound/Skin Impairment: Methods: Explain/Verbal Responses: Reinforcements needed, State content correctly Electronic Signature(s) Signed: 06/19/2022 3:36:20 PM By: Zenaida Deed RN, BSN Entered By: Zenaida Deed on 06/19/2022 13:43:26 -------------------------------------------------------------------------------- Wound Assessment Details Patient Name: Date of Service: [name], Jamie A. 06/19/2022 1:15 PM Medical Record Number: [id number] Patient Account Number: [id number] Date of Birth/Sex: Treating RN: 1980/04/27 (41 y.o. [name] Primary Care Chan Rosasco: Delynn Flavin Other Clinician: Referring Vanshika Jastrzebski: Treating Maira Christon/Extender: [name] Weeks in Treatment: 311 Wound Status Wound Number: 1 Primary Etiology: Pressure Ulcer Mitchelle, Jamie A ([id number]) [id number] Page 6 of 7 Wound Location: Medial Sacrum Wound Status: Open Wounding Event: Pressure Injury Comorbid History: Anemia, Osteomyelitis Date Acquired: 05/15/2016 Weeks Of Treatment: 311 Clustered Wound: No Photos Wound Measurements Length: (cm) 1.8 Width: (cm) 1 Depth: (cm) 1.3 Area: (cm) 1.414 Volume: (cm) 1.838 % Reduction in Area: 95.7% % Reduction in Volume: 98.7% Epithelialization: Small (1-33%) Tunneling: No Undermining: Yes Starting Position (o'clock): 2 Ending Position (o'clock): 4 Maximum Distance: (cm) 1.7 Wound Description Classification: Category/Stage IV Wound Margin: Distinct, outline attached Exudate Amount: Medium Exudate Type: Serosanguineous Exudate Color: red, brown Foul Odor After Cleansing: No Slough/Fibrino Yes Wound Bed Granulation  Amount: Large (67-100%) Exposed Structure Granulation Quality: Red, Pink Fascia Exposed: No Necrotic Amount: Small (1-33%) Fat Layer (Subcutaneous Tissue) Exposed: Yes Necrotic Quality: Adherent Slough Tendon Exposed: No Muscle Exposed: No Joint Exposed: No Bone Exposed: No Periwound Skin Texture Texture Color No Abnormalities Noted: Yes No Abnormalities Noted: Yes Moisture Temperature / Pain No Abnormalities Noted: Yes Temperature: No Abnormality Treatment Notes  Wound #1 (Sacrum) Wound Laterality: Medial Cleanser Soap and Water Discharge Instruction: May shower and wash wound with dial antibacterial soap and water prior to dressing change. Anasept Antimicrobial Skin and Wound Cleanser, 8 (oz) Discharge Instruction: Cleanse the wound with Anasept cleanser prior to applying a clean dressing using gauze sponges, not tissue or cotton balls. Peri-Wound Care Zinc Oxide Ointment 30g tube Discharge Instruction: Apply Zinc Oxide to periwound as needed for moisture Topical Primary Dressing Promogran Prisma Matrix, 4.34 (sq in) (silver collagen) Discharge Instruction: Moisten collagen with saline or hydrogel and lay in bottom of wound bed. BIRDELL, FRASIER A ([id number]) [id number] Page 7 of 7 VASHE Discharge Instruction: moisten gauze with Vashe and pack into wound Secondary Dressing Zetuvit Plus Silicone Border Dressing 5x5 (in/in) Discharge Instruction: Apply silicone border over primary dressing as directed. Secured With Compression Wrap Compression Stockings Facilities manager) Signed: 06/19/2022 3:33:44 PM By: Samuella Bruin Entered By: Samuella Bruin on 06/19/2022 13:40:47 -------------------------------------------------------------------------------- Vitals Details Patient Name: Date of Service: [name] 06/19/2022 1:15 PM Medical Record Number: [id number] Patient Account Number: [id number] Date of Birth/Sex:  Treating RN: 1980/10/12 (41 y.o. [name] Standard Primary Care Sebastian Dzik: Delynn Flavin Other Clinician: Referring [place]: Treating Morgana Rowley/Extender: [name] Weeks in Treatment: 311 Vital Signs Time Taken: 01:13 Temperature (F): 98.3 Height (in): 65 Pulse (bpm): 90 Weight (lbs): 201 Respiratory Rate (breaths/min): 20 Body Mass Index (BMI): 33.4 Blood Pressure (mmHg): 118/79 Reference Range: 80 - 120 mg / dl Electronic Signature(s) Signed: 06/19/2022 3:37:53 PM By: Dayton Scrape Entered By: Dayton Scrape on 06/19/2022 13:13:53

## 2022-06-19 NOTE — Progress Notes (Signed)
Jamie, [name] ([id number]) [id number] Page 1 of 8 Visit Report for 05/22/2022 Arrival Information Details Patient Name: Date of Service: [name], [name] 05/22/2022 12:30 PM Medical Record Number: [id number] Patient Account Number: [id number] Date of Birth/Sex: Treating RN: [birth year] (41 y.o. [name] Primary Care Login [place]: [name] Other Clinician: Referring [name]: Treating [name]/Extender: [name] in Treatment: 307 Visit Information History Since Last Visit All ordered tests and consults were completed: Yes Patient Arrived: Wheel Chair Added or deleted any medications: No Arrival Time: 12:37 Any new allergies or adverse reactions: No Accompanied By: caregivers Had [name] fall or experienced change in No Transfer Assistance: Nurse, adult activities of daily living that may affect Patient Identification Verified: Yes risk of falls: Secondary Verification Process Completed: Yes Signs or symptoms of abuse/neglect since last visito No Patient Requires Transmission-Based Precautions: No Hospitalized since last visit: No Patient Has Alerts: No Implantable device outside of the clinic excluding No cellular tissue based products placed in the center since last visit: Has Dressing in Place as Prescribed: Yes Pain Present Now: Yes Electronic Signature(s) Signed: 06/19/2022 1:55:51 PM By: [name] Entered By: [name] on 05/22/2022 12:38:09 -------------------------------------------------------------------------------- Clinic Level of Care Assessment Details Patient Name: Date of Service: [name], [name] 05/22/2022 12:30 PM Medical Record Number: [id number] Patient Account Number: [id number] Date of Birth/Sex: Treating RN: 05-29-1980 (41 y.o. [name] Primary Care [name]: [name] Other Clinician: Referring [name]: Treating [name]/Extender: [name] in Treatment: 307 Clinic Level of Care Assessment Items TOOL 4 Quantity Score  - 0 Use when only an EandM is performed on FOLLOW-UP visit ASSESSMENTS - Nursing Assessment / Reassessment X- 1 10 Reassessment of Co-morbidities (includes updates in patient status) X- 1 5 Reassessment of Adherence to Treatment Plan ASSESSMENTS - Wound and Skin [name] ssessment / Reassessment X - Simple Wound Assessment / Reassessment - one wound 1 5  - 0 Complex Wound Assessment / Reassessment - multiple wounds  - 0 Dermatologic / Skin Assessment (not related to wound area) ASSESSMENTS - Focused Assessment  - 0 Circumferential Edema Measurements - multi extremities  - 0 Nutritional Assessment / Counseling / Intervention  - 0 Lower Extremity Assessment (monofilament, tuning fork, pulses)  - 0 Peripheral Arterial Disease Assessment (using hand held doppler) ASSESSMENTS - Ostomy and/or Continence Assessment and Care  - 0 Incontinence Assessment and Management  - 0 Ostomy Care Assessment and Management (repouching, etc.) PROCESS - Coordination of Care [name], [name] ([id number]) [id number] Page 2 of 8 X- 1 15 Simple Patient / Family Education for ongoing care  - 0 Complex (extensive) Patient / Family Education for ongoing care X- 1 10 Staff obtains Chiropractor, Records, T Results / Process Orders est  - 0 Staff telephones HHA, Nursing Homes / Clarify orders / etc  - 0 Routine Transfer to another Facility (non-emergent condition)  - 0 Routine Hospital Admission (non-emergent condition)  - 0 New Admissions / Manufacturing engineer / Ordering NPWT Apligraf, etc. ,  - 0 Emergency Hospital Admission (emergent condition) X- 1 10 Simple Discharge Coordination  - 0 Complex (extensive) Discharge Coordination PROCESS - Special Needs  - 0 Pediatric / Minor Patient Management  - 0 Isolation Patient  Management  - 0 Hearing / Language / Visual special needs  - 0 Assessment of Community assistance (transportation, D/C planning, etc.)  - 0 Additional assistance / Altered mentation  - 0 Support Surface(s) Assessment (bed, cushion, seat, etc.) INTERVENTIONS - Wound Cleansing /  Measurement X - Simple Wound Cleansing - one wound 1 5 []  - 0 Complex Wound Cleansing - multiple wounds X- 1 5 Wound Imaging (photographs - any number of wounds) []  - 0 Wound Tracing (instead of photographs) X- 1 5 Simple Wound Measurement - one wound []  - 0 Complex Wound Measurement - multiple wounds INTERVENTIONS - Wound Dressings X - Small Wound Dressing one or multiple wounds 1 10 []  - 0 Medium Wound Dressing one or multiple wounds []  - 0 Large Wound Dressing one or multiple wounds []  - 0 Application of Medications - topical []  - 0 Application of Medications - injection INTERVENTIONS - Miscellaneous []  - 0 External ear exam []  - 0 Specimen Collection (cultures, biopsies, blood, body fluids, etc.) []  - 0 Specimen(s) / Culture(s) sent or taken to Lab for analysis X- 1 10 Patient Transfer (multiple staff / Nurse, adult / Similar devices) []  - 0 Simple Staple / Suture removal (25 or less) []  - 0 Complex Staple / Suture removal (26 or more) []  - 0 Hypo / Hyperglycemic Management (close monitor of Blood Glucose) []  - 0 Ankle / Brachial Index (ABI) - do not check if billed separately X- 1 5 Vital Signs Has the patient been seen at the hospital within the last three years: Yes Total Score: 95 Level Of Care: New/Established - Level 3 Electronic Signature(s) Signed: 05/26/2022 6:04:53 PM By: Jamie Deed RN, BSN [name], [name] ([id number]) [phone].pdf Page 3 of 8 Entered By: [name] on 05/26/2022 17:15:08 -------------------------------------------------------------------------------- Encounter Discharge Information Details Patient Name: Date of  Service: [name], [name] 05/22/2022 12:30 PM Medical Record Number: [id number] Patient Account Number: [id number] Date of Birth/Sex: Treating RN: December 11, 1980 (41 y.o. [name] Primary Care Abhimanyu Cruces: [name] Other Clinician: Referring Emily Massar: Treating Yvonda Fouty/Extender: [name] in Treatment: [phone] Encounter Discharge Information Items Discharge Condition: Stable Ambulatory Status: Wheelchair Discharge Destination: Home Transportation: Other Accompanied By: caregivers Schedule Follow-up Appointment: Yes Clinical Summary of Care: Patient Declined Electronic Signature(s) Signed: 06/19/2022 1:55:51 PM By: [name] Entered By: [name] on 05/22/2022 13:30:55 -------------------------------------------------------------------------------- Lower Extremity Assessment Details Patient Name: Date of Service: [name], [name] 05/22/2022 12:30 PM Medical Record Number: [id number] Patient Account Number: [id number] Date of Birth/Sex: Treating RN: [birth year] (41 y.o. [name] Primary Care Shawntee Mainwaring: [name] Other Clinician: Referring Vienna Folden: Treating Leeon Makar/Extender: [name] Weeks in Treatment: 307 Electronic Signature(s) Signed: 06/19/2022 1:55:51 PM By: [name] Entered By: [name] on 05/22/2022 12:49:58 -------------------------------------------------------------------------------- Multi Wound Chart Details Patient Name: Date of Service: Jamie Rink [name]. 05/22/2022 12:30 PM Medical Record Number: [id number] Patient Account Number: [id number] Date of Birth/Sex: Treating RN: 10/16/1980 (41 y.o. F) Primary Care Adell Koval: [name] Other Clinician: Referring Manali Mcelmurry: Treating Galan Ghee/Extender: [name] Weeks in Treatment: 307 Vital Signs Height(in): 65 Pulse(bpm): 64 Weight(lbs): 201 Blood Pressure(mmHg): 112/71 Body  Mass Index(BMI): 33.4 Temperature(F): 97.7 Respiratory Rate(breaths/min): 18 [1:Photos:] [N/[name]:N/[name]] Medial Sacrum N/[name] N/[name] Wound Location: Pressure Injury N/[name] N/[name] Wounding Event: Pressure Ulcer N/[name] N/[name] Primary Etiology: Anemia, Osteomyelitis N/[name] N/[name] Comorbid History: 05/15/2016 N/[name] N/[name] Date Acquired: 307 N/[name] N/[name] Weeks of Treatment: Open N/[name] N/[name] Wound Status: No N/[name] N/[name] Wound Recurrence: 2.4x1.3x2.1 N/[name] N/[name] Measurements L x W x D (cm) 2.45 N/[name] N/[name] [name] (cm) : rea 5.146 N/[name] N/[name] Volume (cm) : 92.60% N/[name] N/[name] % Reduction in [name] rea: 96.40% N/[name] N/[name] % Reduction in Volume: 1 Position 1 (o'clock): 2.2 Maximum Distance 1 (cm): Yes N/[name]  N/[name] Tunneling: Category/Stage IV N/[name] N/[name] Classification: Medium N/[name] N/[name] Exudate [name] mount: Serosanguineous N/[name] N/[name] Exudate Type: red, brown N/[name] N/[name] Exudate Color: Distinct, outline attached N/[name] N/[name] Wound Margin: Medium (34-66%) N/[name] N/[name] Granulation [name] mount: Red, Pink N/[name] N/[name] Granulation Quality: Medium (34-66%) N/[name] N/[name] Necrotic [name] mount: Fat Layer (Subcutaneous Tissue): Yes N/[name] N/[name] Exposed Structures: Fascia: No Tendon: No Muscle: No Joint: No Bone: No None N/[name] N/[name] Epithelialization: Rash: No N/[name] N/[name] Periwound Skin Texture: Dry/Scaly: Yes N/[name] N/[name] Periwound Skin Moisture: No Abnormalities Noted N/[name] N/[name] Periwound Skin Color: No Abnormality N/[name] N/[name] Temperature: Treatment Notes Electronic Signature(s) Signed: 05/22/2022 1:12:26 PM By: Duanne Guess MD FACS Entered By: Duanne Guess on 05/22/2022 13:12:25 -------------------------------------------------------------------------------- Multi-Disciplinary Care Plan Details Patient Name: Date of Service: Jamie Rink [name]. 05/22/2022 12:30 PM Medical Record Number: [id number] Patient Account Number: [id number] Date of Birth/Sex: Treating RN: 11-27-1980 (41 y.o. [name] Primary Care Chrishawna Farina: [name] Other Clinician: Referring Brendaly Townsel: Treating  Emmalie Haigh/Extender: [name] Weeks in Treatment: 307 Multidisciplinary Care Plan reviewed with physician Active Inactive Pressure Nursing Diagnoses: Knowledge deficit related to management of pressures ulcers Potential for impaired tissue integrity related to pressure, friction, moisture, and shear Goals: Patient will remain free from development of additional pressure ulcers Date Initiated: 06/27/2016 Date Inactivated: 01/17/2019 Target Resolution Date: 12/24/2018 Goal Status: Met Patient/caregiver will verbalize risk factors for pressure ulcer development Date Initiated: 06/27/2016 Date Inactivated: 07/08/2017 Target Resolution Date: 07/08/2017 Goal Status: Met Patient/caregiver will verbalize understanding of pressure ulcer management Date Initiated: 06/27/2016 Target Resolution Date: 07/01/2022 Goal Status: Active Interventions: [name], [name] ([id number]) [id number] Page 5 of 8 Assess: immobility, friction, shearing, incontinence upon admission and as needed Assess offloading mechanisms upon admission and as needed Assess potential for pressure ulcer upon admission and as needed Provide education on pressure ulcers Treatment Activities: Patient referred for pressure reduction/relief devices : 06/27/2016 Pressure reduction/relief device ordered : 06/27/2016 Notes: Wound/Skin Impairment Nursing Diagnoses: Impaired tissue integrity Knowledge deficit related to smoking impact on wound healing Knowledge deficit related to ulceration/compromised skin integrity Goals: Patient will demonstrate [name] reduced rate of smoking or cessation of smoking Date Initiated: 06/27/2016 Date Inactivated: 01/26/2017 Target Resolution Date: 01/08/2017 Goal Status: Unmet Unmet Reason: pt continues to smoke Patient/caregiver will verbalize understanding of skin care regimen Date Initiated: 06/27/2016 Target Resolution Date: 07/01/2022 Goal Status:  Active Ulcer/skin breakdown will have [name] volume reduction of 50% by week 8 Date Initiated: 06/27/2016 Date Inactivated: 12/11/2016 Target Resolution Date: 07/25/2016 Goal Status: Met Interventions: Assess patient/caregiver ability to obtain necessary supplies Assess patient/caregiver ability to perform ulcer/skin care regimen upon admission and as needed Assess ulceration(s) every visit Provide education on smoking Provide education on ulcer and skin care Treatment Activities: Skin care regimen initiated : 06/27/2016 Topical wound management initiated : 06/27/2016 Notes: Electronic Signature(s) Signed: 06/19/2022 1:55:51 PM By: [name] Entered By: [name] on 05/22/2022 13:00:50 -------------------------------------------------------------------------------- Pain Assessment Details Patient Name: Date of Service: [name], [name]. 05/22/2022 12:30 PM Medical Record Number: [id number] Patient Account Number: [id number] Date of Birth/Sex: Treating RN: August 26, 1980 (41 y.o. [name] Primary Care Soleia Badolato: [name] Other Clinician: Referring Lynkin Saini: Treating Damonie Ellenwood/Extender: [name] Weeks in Treatment: [phone] Active Problems Location of Pain Severity and Description of Pain Patient Has Paino Yes Site Locations Pain Location: [name], [name] ([id number]) [id number] Page 6 of 8 Pain Location: Generalized Pain Pain Management and Medication Current Pain Management: Medication: Yes Rest: Yes Electronic Signature(s) Signed: 06/19/2022 1:55:51 PM  By: Isabelle Course By: [name] on 05/22/2022 12:39:36 -------------------------------------------------------------------------------- Patient/Caregiver Education Details Patient Name: Date of Service: [name] 3/21/2024andnbsp12:30 PM Medical Record Number: [id number] Patient Account Number: [id number] Date of Birth/Gender:  Treating RN: 14-Mar-1980 (41 y.o. [name] Primary Care Physician: [name] Other Clinician: Referring Physician: Treating Physician/Extender: [name] in Treatment: [phone] Education Assessment Education Provided To: Patient and Caregiver Education Topics Provided Pain: Methods: Explain/Verbal Responses: Reinforcements needed, State content correctly Wound/Skin Impairment: Handouts: Caring for Your Ulcer Methods: Explain/Verbal Responses: Reinforcements needed, State content correctly Electronic Signature(s) Signed: 06/19/2022 1:55:51 PM By: [name] Entered By: [name] on 05/22/2022 13:01:40 -------------------------------------------------------------------------------- Wound Assessment Details Patient Name: Date of Service: [name], [name]. 05/22/2022 12:30 PM Medical Record Number: [id number] Patient Account Number: [id number] Date of Birth/Sex: Treating RN: [birth year] (41 y.o. [name] Primary Care Mayeli Bornhorst: [name] Other Clinician: Referring Leeandre Nordling: Treating Muhsin Doris/Extender: [name] Weeks in Treatment: 307 Wound Status [name], [name] ([id number]) [id number] Page 7 of 8 Wound Number: 1 Primary Etiology: Pressure Ulcer Wound Location: Medial Sacrum Wound Status: Open Wounding Event: Pressure Injury Comorbid History: Anemia, Osteomyelitis Date Acquired: 05/15/2016 Weeks Of Treatment: 307 Clustered Wound: No Photos Wound Measurements Length: (cm) 2.4 Width: (cm) 1.3 Depth: (cm) 2.1 Area: (cm) 2.45 Volume: (cm) 5.146 % Reduction in Area: 92.6% % Reduction in Volume: 96.4% Epithelialization: None Tunneling: Yes Position (o'clock): 1 Maximum Distance: (cm) 2.2 Wound Description Classification: Category/Stage IV Wound Margin: Distinct, outline attached Exudate Amount: Medium Exudate Type: Serosanguineous Exudate Color:  red, brown Foul Odor After Cleansing: No Slough/Fibrino Yes Wound Bed Granulation Amount: Medium (34-66%) Exposed Structure Granulation Quality: Red, Pink Fascia Exposed: No Necrotic Amount: Medium (34-66%) Fat Layer (Subcutaneous Tissue) Exposed: Yes Necrotic Quality: Adherent Slough Tendon Exposed: No Muscle Exposed: No Joint Exposed: No Bone Exposed: No Periwound Skin Texture Texture Color No Abnormalities Noted: Yes No Abnormalities Noted: Yes Moisture Temperature / Pain No Abnormalities Noted: Yes Temperature: No Abnormality Electronic Signature(s) Signed: 05/22/2022 5:45:28 PM By: Jamie Deed RN, BSN Signed: 06/19/2022 1:55:51 PM By: [name] Entered By: [name] on 05/22/2022 13:03:00 -------------------------------------------------------------------------------- Vitals Details Patient Name: Date of Service: Jamie Rink [name]. 05/22/2022 12:30 PM Medical Record Number: [id number] Patient Account Number: [id number] Date of Birth/Sex: Treating RN: 26-Jun-1980 (41 y.o. [name] Primary Care Dewanna Hurston: [name] Other Clinician: Referring [place] Searles: Treating [name]/Extender: [name] Weeks in Treatment: 307 Vital Signs Time Taken: 12:30 Temperature (F): 97.7 Height (in): 65 Pulse (bpm): 64 Termine, Juaquina [name] ([id number]) [id number] Page 8 of 8 Weight (lbs): 201 Respiratory Rate (breaths/min): 18 Body Mass Index (BMI): 33.4 Blood Pressure (mmHg): 112/71 Reference Range: 80 - 120 mg / dl Electronic Signature(s) Signed: 06/19/2022 1:55:51 PM By: [name] Entered By: [name] on 05/22/2022 12:38:41
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
## 2022-06-25 ENCOUNTER — Ambulatory Visit (HOSPITAL_COMMUNITY)
Admission: RE | Admit: 2022-06-25 | Discharge: 2022-06-25 | Disposition: A | Discharge status: Home / Self Care | Payer: Medicaid Other | Source: Ambulatory Visit | Attending: Student | Admitting: Student

## 2022-06-25 DIAGNOSIS — R569 Unspecified convulsions: Secondary | ICD-10-CM | POA: Insufficient documentation

## 2022-06-25 MED ORDER — GADOBUTROL 1 MMOL/ML IV SOLN
10.0000 mL | Freq: Once | INTRAVENOUS | Status: AC | PRN
  Administered 2022-06-25: 10 mL via INTRAVENOUS

## 2022-07-02 ENCOUNTER — Telehealth: Payer: Self-pay | Admitting: Family Medicine

## 2022-07-02 ENCOUNTER — Other Ambulatory Visit: Payer: Self-pay | Admitting: Family Medicine

## 2022-07-02 NOTE — Telephone Encounter (Signed)
Pts mom called stating that [facility] and while there she asked to make sure that PCP did send in pts Oxycodone refills. Was told by pharmacy that PCP did not send in refills.  Explained to pt that on my end, the refills were sent and we did receive [facility].   Wants nurse to call and check on this.

## 2022-07-02 NOTE — Telephone Encounter (Signed)
Aware and verbalized understanding.

## 2022-07-02 NOTE — Telephone Encounter (Signed)
Aware refill was sent can fill to 07/04/22.

## 2022-07-10 ENCOUNTER — Telehealth: Payer: Self-pay | Admitting: Oncology

## 2022-07-17 ENCOUNTER — Encounter (HOSPITAL_BASED_OUTPATIENT_CLINIC_OR_DEPARTMENT_OTHER): Payer: Medicaid Other | Attending: General Surgery | Admitting: General Surgery

## 2022-07-17 DIAGNOSIS — L89154 Pressure ulcer of sacral region, stage 4: Secondary | ICD-10-CM | POA: Diagnosis present

## 2022-07-17 DIAGNOSIS — G8222 Paraplegia, incomplete: Secondary | ICD-10-CM | POA: Insufficient documentation

## 2022-07-18 ENCOUNTER — Inpatient Hospital Stay: Payer: Medicaid Other | Admitting: Oncology

## 2022-07-18 ENCOUNTER — Inpatient Hospital Stay: Payer: Medicaid Other

## 2022-07-24 ENCOUNTER — Other Ambulatory Visit: Payer: Self-pay | Admitting: Oncology

## 2022-07-24 DIAGNOSIS — D75839 Thrombocytosis, unspecified: Secondary | ICD-10-CM
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
## 2022-07-25 ENCOUNTER — Other Ambulatory Visit: Payer: Self-pay

## 2022-07-25 ENCOUNTER — Inpatient Hospital Stay (HOSPITAL_BASED_OUTPATIENT_CLINIC_OR_DEPARTMENT_OTHER): Payer: Medicaid Other | Admitting: Oncology

## 2022-07-25 ENCOUNTER — Inpatient Hospital Stay: Payer: Medicaid Other

## 2022-07-25 ENCOUNTER — Inpatient Hospital Stay: Payer: Medicaid Other | Attending: Oncology

## 2022-07-25 VITALS — BP 110/80 | HR 86 | Temp 97.9°F | Resp 16 | Ht 66.0 in

## 2022-07-25 DIAGNOSIS — Z87440 Personal history of urinary (tract) infections: Secondary | ICD-10-CM | POA: Insufficient documentation

## 2022-07-25 DIAGNOSIS — D75839 Thrombocytosis, unspecified: Secondary | ICD-10-CM

## 2022-07-25 DIAGNOSIS — G8220 Paraplegia, unspecified: Secondary | ICD-10-CM | POA: Insufficient documentation

## 2022-07-25 DIAGNOSIS — I10 Essential (primary) hypertension: Secondary | ICD-10-CM | POA: Diagnosis not present

## 2022-07-25 DIAGNOSIS — E785 Hyperlipidemia, unspecified: Secondary | ICD-10-CM | POA: Diagnosis not present

## 2022-07-25 DIAGNOSIS — D649 Anemia, unspecified: Secondary | ICD-10-CM

## 2022-07-25 DIAGNOSIS — K219 Gastro-esophageal reflux disease without esophagitis: Secondary | ICD-10-CM | POA: Insufficient documentation

## 2022-07-25 LAB — CBC WITH DIFFERENTIAL/PLATELET
Abs Immature Granulocytes: 0.03 10*3/uL (ref 0.00–0.07)
Basophils Absolute: 0.1 10*3/uL (ref 0.0–0.1)
Basophils Relative: 1 %
Eosinophils Absolute: 0.3 10*3/uL (ref 0.0–0.5)
Eosinophils Relative: 4 %
HCT: 40.1 % (ref 36.0–46.0)
Hemoglobin: 12.9 g/dL (ref 12.0–15.0)
Immature Granulocytes: 0 %
Lymphocytes Relative: 35 %
Lymphs Abs: 3 10*3/uL (ref 0.7–4.0)
MCH: 27.7 pg (ref 26.0–34.0)
MCHC: 32.2 g/dL (ref 30.0–36.0)
MCV: 86.1 fL (ref 80.0–100.0)
Monocytes Absolute: 0.6 10*3/uL (ref 0.1–1.0)
Monocytes Relative: 7 %
Neutro Abs: 4.7 10*3/uL (ref 1.7–7.7)
Neutrophils Relative %: 53 %
Platelets: 476 10*3/uL — ABNORMAL HIGH (ref 150–400)
RBC: 4.66 MIL/uL (ref 3.87–5.11)
RDW: 13.7 % (ref 11.5–15.5)
WBC: 8.7 10*3/uL (ref 4.0–10.5)
nRBC: 0 % (ref 0.0–0.2)

## 2022-07-25 LAB — FERRITIN: Ferritin: 285 ng/mL (ref 11–307)

## 2022-08-04 ENCOUNTER — Telehealth: Payer: Self-pay | Admitting: Family Medicine

## 2022-08-04 ENCOUNTER — Telehealth: Payer: Self-pay

## 2022-08-04 DIAGNOSIS — S34101D Unspecified injury to L1 level of lumbar spinal cord, subsequent encounter: Secondary | ICD-10-CM

## 2022-08-04 DIAGNOSIS — G8929 Other chronic pain: Secondary | ICD-10-CM

## 2022-08-04 DIAGNOSIS — T1490XS Injury, unspecified, sequela: Secondary | ICD-10-CM

## 2022-08-04 MED ORDER — OXYCODONE HCL 10 MG PO TABS
ORAL_TABLET | ORAL | 0 refills | Status: DC
Start: 2022-08-04 — End: 2022-09-17

## 2022-08-04 MED ORDER — OXYCODONE HCL 10 MG PO TABS
10.0000 mg | ORAL_TABLET | Freq: Three times a day (TID) | ORAL | 0 refills | Status: DC
Start: 2022-09-02 — End: 2022-09-17

## 2022-08-04 NOTE — Telephone Encounter (Signed)
done

## 2022-08-04 NOTE — Telephone Encounter (Signed)
REF# [id number] NCTRACK # [id number].

## 2022-08-04 NOTE — Telephone Encounter (Signed)
Patient just had her yearly [facility]S appt and that's what is messing her refill up on her pain medication and we need to send in a new prescription in for June and that's per Medicaid. Please call when done.

## 2022-08-04 NOTE — Telephone Encounter (Signed)
Pts mom spoke with [employer] and was told that pts pain medication is requiring PA.

## 2022-08-04 NOTE — Telephone Encounter (Signed)
Approval Entry ConfirmationPrint this page  Page Zoom Help  HelpRequired Field indicates a required field Legendform legend Approval Entry Complete Form HelpConfirmation [phone] WPrior Approval [phone]  Status:APPROVED Thank you. Your request has been successfully submitted.  Pharmacy informed

## 2022-08-05 ENCOUNTER — Telehealth: Payer: Self-pay

## 2022-08-05 ENCOUNTER — Telehealth: Payer: Self-pay | Admitting: Family Medicine

## 2022-08-05 ENCOUNTER — Other Ambulatory Visit (HOSPITAL_COMMUNITY): Payer: Self-pay

## 2022-08-05 NOTE — Telephone Encounter (Addendum)
Patient Advocate Encounter  Prior Authorization for Oxycodone 10mg  has been approved with [place] Medicaid.    PA# [id number] Effective dates: 08/05/22 through 02/01/23  Per [facility] test claim, copay for 30 days supply is $0  Pharmacy aware

## 2022-08-05 NOTE — Telephone Encounter (Signed)
Pharmacy Patient Advocate Encounter   Received notification that prior authorization for Oxycodone is required/requested.   PA submitted to  [facility] Medicaid  via [employer] or [facility]) confirmation # [id number] W Status is pending

## 2022-08-05 NOTE — Telephone Encounter (Signed)
[facility] and [facility] PA was approved today and patient picked up the rx at 1:19 per pharmacist

## 2022-08-05 NOTE — Telephone Encounter (Signed)
Aware PA has been done they can contact pharmacy now about getting refill

## 2022-08-05 NOTE — Telephone Encounter (Signed)
[facility] not [facility] so they called Medicaid and per Medicaid, the PA form was filled out for wrong Rx. It was filled out for ER instead of IR. Needs to be filled out for IR and sent back asap.

## 2022-08-05 NOTE — Telephone Encounter (Signed)
PA submitted via [employer]. Created new encounter for PA. Will route back to pool once determination has been made.

## 2022-08-09 ENCOUNTER — Other Ambulatory Visit: Payer: Self-pay | Admitting: Family Medicine

## 2022-08-14 ENCOUNTER — Encounter (HOSPITAL_BASED_OUTPATIENT_CLINIC_OR_DEPARTMENT_OTHER): Payer: Medicaid Other | Attending: General Surgery | Admitting: General Surgery

## 2022-08-14 DIAGNOSIS — Z87440 Personal history of urinary (tract) infections: Secondary | ICD-10-CM | POA: Diagnosis not present

## 2022-08-14 DIAGNOSIS — L89154 Pressure ulcer of sacral region, stage 4: Secondary | ICD-10-CM | POA: Diagnosis present

## 2022-08-14 DIAGNOSIS — F17200 Nicotine dependence, unspecified, uncomplicated: Secondary | ICD-10-CM | POA: Diagnosis not present

## 2022-08-14 DIAGNOSIS — G8222 Paraplegia, incomplete: Secondary | ICD-10-CM | POA: Diagnosis not present

## 2022-08-16 NOTE — Progress Notes (Signed)
[name], [name] ([id number]) [id number] Page 1 of 6 Visit Report for 08/14/2022 Arrival Information Details Patient Name: Date of Service: [name], [name] 08/14/2022 12:30 PM Medical Record Number: [id number] Patient Account Number: [id number] Date of Birth/Sex: Treating RN: [birth year] (41 y.o. [name] Primary Care [name]: [name] Other Clinician: Referring [name]: Treating [name]/Extender: [name] in Treatment: 319 Visit Information History Since Last Visit Added or deleted any medications: No Patient Arrived: Wheel Chair Any new allergies or adverse reactions: No Arrival Time: 12:42 Had [name] fall or experienced change in No Accompanied By: caregiver activities of daily living that may affect Transfer Assistance: [name] Lift risk of falls: Patient Identification Verified: Yes Signs or symptoms of abuse/neglect since last visito No Secondary Verification Process Completed: Yes Hospitalized since last visit: No Patient Requires Transmission-Based Precautions: No Implantable device outside of the clinic excluding No Patient Has Alerts: No cellular tissue based products placed in the center since last visit: Has Dressing in Place as Prescribed: Yes Pain Present Now: Yes Electronic Signature(s) Signed: 08/14/2022 4:48:54 PM By: Zenaida Deed RN, BSN Entered By: Zenaida Deed on 08/14/2022 12:43:06 -------------------------------------------------------------------------------- Lower Extremity Assessment Details Patient Name: Date of Service: [name], [name]. 08/14/2022 12:30 PM Medical Record Number: [id number] Patient Account Number: [id number] Date of Birth/Sex: Treating RN: 1980/05/24 (41 y.o. [name] Primary Care Danett Palazzo: [name] Other Clinician: Referring [name]: Treating [name]: [name] in Treatment:  319 Electronic Signature(s) Signed: 08/14/2022 4:48:54 PM By: Zenaida Deed RN, BSN Entered By: Zenaida Deed on 08/14/2022 12:57:11 -------------------------------------------------------------------------------- Multi Wound Chart Details Patient Name: Date of Service: [name]. 08/14/2022 12:30 PM Medical Record Number: [id number] Patient Account Number: [id number] Date of Birth/Sex: Treating RN: [birth year] (41 y.o. F) Primary Care Galaxy Borden: [name] Other Clinician: Referring Verneal Wiers: Treating Brendon Christoffel/Extender: [name] in Treatment: [address], [name] ([id number]) [id number] Page 2 of 6 Vital Signs Height(in): 65 Pulse(bpm): 80 Weight(lbs): 201 Blood Pressure(mmHg): 115/83 Body Mass Index(BMI): 33.4 Temperature(F): 97.9 Respiratory Rate(breaths/min): 18 [1:Photos:] [N/[name]:N/[name]] Medial Sacrum N/[name] N/[name] Wound Location: Pressure Injury N/[name] N/[name] Wounding Event: Pressure Ulcer N/[name] N/[name] Primary Etiology: Anemia, Osteomyelitis N/[name] N/[name] Comorbid History: 05/15/2016 N/[name] N/[name] Date Acquired: 319 N/[name] N/[name] [name] of Treatment: Open N/[name] N/[name] Wound Status: No N/[name] N/[name] Wound Recurrence: 1.1x1x1.2 N/[name] N/[name] Measurements L x W x D (cm) 0.864 N/[name] N/[name] [name] (cm) : rea 1.037 N/[name] N/[name] Volume (cm) : 97.40% N/[name] N/[name] % Reduction in [name] rea: 99.30% N/[name] N/[name] % Reduction in Volume: 2 Starting Position 1 (o'clock): 4 Ending Position 1 (o'clock): 1.5 Maximum Distance 1 (cm): Yes N/[name] N/[name] Undermining: Category/Stage IV N/[name] N/[name] Classification: Medium N/[name] N/[name] Exudate [name] mount: Serosanguineous N/[name] N/[name] Exudate Type: red, brown N/[name] N/[name] Exudate Color: Thickened N/[name] N/[name] Wound Margin: Large (67-100%) N/[name] N/[name] Granulation [name] mount: Red, Pink N/[name] N/[name] Granulation Quality: Small (1-33%) N/[name] N/[name] Necrotic [name] mount: Fat Layer (Subcutaneous Tissue): Yes N/[name] N/[name] Exposed Structures: Fascia: No Tendon: No Muscle: No Joint:  No Bone: No Small (1-33%) N/[name] N/[name] Epithelialization: Debridement - Selective/Open Wound N/[name] N/[name] Debridement: Pre-procedure Verification/Time Out 13:10 N/[name] N/[name] Taken: Lidocaine 4% Topical Solution N/[name] N/[name] Pain Control: Slough N/[name] N/[name] Tissue Debrided: Skin/Epidermis N/[name] N/[name] Level: 0.86 N/[name] N/[name] Debridement [name] (sq cm): rea Curette N/[name] N/[name] Instrument: Minimum N/[name] N/[name] Bleeding: Pressure N/[name] N/[name] Hemostasis [name] chieved: 0 N/[name] N/[name] Procedural Pain: 0 N/[name] N/[name] Post Procedural Pain: Procedure was tolerated well N/[name] N/[name] Debridement  Treatment Response: 1.1x1x1.2 N/[name] N/[name] Post Debridement Measurements L x W x D (cm) 1.037 N/[name] N/[name] Post Debridement Volume: (cm) Category/Stage IV N/[name] N/[name] Post Debridement Stage: Rash: No N/[name] N/[name] Periwound Skin Texture: Maceration: Yes N/[name] N/[name] Periwound Skin Moisture: Dry/Scaly: No No Abnormalities Noted N/[name] N/[name] Periwound Skin Color: No Abnormality N/[name] N/[name] Temperature: Debridement N/[name] N/[name] Procedures Performed: Treatment Notes Electronic Signature(s) Signed: 08/14/2022 1:24:11 PM By: Jamie Guess MD FACS Varney Daily, [name] ([id number]) [id number] Page 3 of 6 Entered By: [name] on 08/14/2022 13:24:11 -------------------------------------------------------------------------------- Multi-Disciplinary Care Plan Details Patient Name: Date of Service: [name], [name] 08/14/2022 12:30 PM Medical Record Number: [id number] Patient Account Number: [id number] Date of Birth/Sex: Treating RN: December 07, 1980 (41 y.o. [name] Primary Care Rubena Roseman: [name] Other Clinician: Referring Nalina Yeatman: Treating Marley Pakula/Extender: [name] in Treatment: 319 Multidisciplinary Care Plan reviewed with physician Active Inactive Pressure Nursing Diagnoses: Knowledge deficit related to management of pressures ulcers Potential for impaired tissue integrity related to pressure,  friction, moisture, and shear Goals: Patient will remain free from development of additional pressure ulcers Date Initiated: 06/27/2016 Date Inactivated: 01/17/2019 Target Resolution Date: 12/24/2018 Goal Status: Met Patient/caregiver will verbalize risk factors for pressure ulcer development Date Initiated: 06/27/2016 Date Inactivated: 07/08/2017 Target Resolution Date: 07/08/2017 Goal Status: Met Patient/caregiver will verbalize understanding of pressure ulcer management Date Initiated: 06/27/2016 Target Resolution Date: 09/11/2022 Goal Status: Active Interventions: Assess: immobility, friction, shearing, incontinence upon admission and as needed Assess offloading mechanisms upon admission and as needed Assess potential for pressure ulcer upon admission and as needed Provide education on pressure ulcers Treatment Activities: Patient referred for pressure reduction/relief devices : 06/27/2016 Pressure reduction/relief device ordered : 06/27/2016 Notes: Wound/Skin Impairment Nursing Diagnoses: Impaired tissue integrity Knowledge deficit related to smoking impact on wound healing Knowledge deficit related to ulceration/compromised skin integrity Goals: Patient will demonstrate [name] reduced rate of smoking or cessation of smoking Date Initiated: 06/27/2016 Date Inactivated: 01/26/2017 Target Resolution Date: 01/08/2017 Goal Status: Unmet Unmet Reason: pt continues to smoke Patient/caregiver will verbalize understanding of skin care regimen Date Initiated: 06/27/2016 Target Resolution Date: 09/11/2022 Goal Status: Active Ulcer/skin breakdown will have [name] volume reduction of 50% by week 8 Date Initiated: 06/27/2016 Date Inactivated: 12/11/2016 Target Resolution Date: 07/25/2016 Goal Status: Met Interventions: Assess patient/caregiver ability to obtain necessary supplies Assess patient/caregiver ability to perform ulcer/skin care regimen upon admission and as needed Assess ulceration(s) every  visit Provide education on smoking [name], [name] ([id number]) [id number] Page 4 of 6 Provide education on ulcer and skin care Treatment Activities: Skin care regimen initiated : 06/27/2016 Topical wound management initiated : 06/27/2016 Notes: Electronic Signature(s) Signed: 08/14/2022 4:48:54 PM By: Zenaida Deed RN, BSN Entered By: Zenaida Deed on 08/14/2022 13:07:19 -------------------------------------------------------------------------------- Pain Assessment Details Patient Name: Date of Service: [name], [name]. 08/14/2022 12:30 PM Medical Record Number: [id number] Patient Account Number: [id number] Date of Birth/Sex: Treating RN: 1980/09/29 (41 y.o. [name] Primary Care Tinzlee Craker: [name] Other Clinician: Referring Asta Corbridge: Treating Alden Feagan/Extender: [name] in Treatment: [phone] Active Problems Location of Pain Severity and Description of Pain Patient Has Paino Yes Site Locations Pain Location: Pain in Ulcers With Dressing Change: No Duration of the Pain. Constant / Intermittento Constant Rate the pain. Current Pain Level: 7 Worst Pain Level: 9 Least Pain Level: 3 Character of Pain Describe the Pain: Aching Pain Management and Medication Current Pain Management: Medication: Yes Is the Current Pain Management Adequate: Adequate How does your wound impact  your activities of daily livingo Sleep: No Bathing: No Appetite: No Relationship With Others: No Bladder Continence: No Emotions: No Bowel Continence: No Work: No Toileting: No Drive: No Dressing: No Hobbies: No Electronic Signature(s) Signed: 08/14/2022 4:48:54 PM By: Zenaida Deed RN, BSN Entered By: Zenaida Deed on 08/14/2022 12:57:04 [name] ([id number]) [id number] Page 5 of  6 -------------------------------------------------------------------------------- Patient/Caregiver Education Details Patient Name: Date of Service: [name], [name] 6/13/2024andnbsp12:30 PM Medical Record Number: [id number] Patient Account Number: [id number] Date of Birth/Gender: Treating RN: 07/19/1980 (41 y.o. [name] Primary Care Physician: [name] Other Clinician: Referring Physician: Treating Physician/Extender: [name] in Treatment: 319 Education Assessment Education Provided To: Patient Education Topics Provided Wound Debridement: Methods: Explain/Verbal Responses: Reinforcements needed, State content correctly Wound/Skin Impairment: Methods: Explain/Verbal Responses: Reinforcements needed, State content correctly Electronic Signature(s) Signed: 08/14/2022 4:48:54 PM By: Zenaida Deed RN, BSN Entered By: Zenaida Deed on 08/14/2022 13:08:10 -------------------------------------------------------------------------------- Wound Assessment Details Patient Name: Date of Service: [name]. 08/14/2022 12:30 PM Medical Record Number: [id number] Patient Account Number: [id number] Date of Birth/Sex: Treating RN: 1980/06/06 (41 y.o. [name] Primary Care Yusra Ravert: [name] Other Clinician: Referring Tameca Jerez: Treating Jerre Diguglielmo/Extender: [name] in Treatment: 319 Wound Status Wound Number: 1 Primary Etiology: Pressure Ulcer Wound Location: Medial Sacrum Wound Status: Open Wounding Event: Pressure Injury Comorbid History: Anemia, Osteomyelitis Date Acquired: 05/15/2016 [name] Of Treatment: 319 Clustered Wound: No Photos [name], [name] ([id number]) [id number] Page 6 of 6 Wound Measurements Length: (cm) 1.1 Width: (cm) 1 Depth: (cm) 1.2 Area: (cm) 0.864 Volume: (cm) 1.037 % Reduction in Area: 97.4% % Reduction in  Volume: 99.3% Epithelialization: Small (1-33%) Tunneling: No Undermining: Yes Starting Position (o'clock): 2 Ending Position (o'clock): 4 Maximum Distance: (cm) 1.5 Wound Description Classification: Category/Stage IV Wound Margin: Thickened Exudate Amount: Medium Exudate Type: Serosanguineous Exudate Color: red, brown Foul Odor After Cleansing: No Slough/Fibrino No Wound Bed Granulation Amount: Large (67-100%) Exposed Structure Granulation Quality: Red, Pink Fascia Exposed: No Necrotic Amount: Small (1-33%) Fat Layer (Subcutaneous Tissue) Exposed: Yes Necrotic Quality: Adherent Slough Tendon Exposed: No Muscle Exposed: No Joint Exposed: No Bone Exposed: No Periwound Skin Texture Texture Color No Abnormalities Noted: Yes No Abnormalities Noted: Yes Moisture Temperature / Pain No Abnormalities Noted: No Temperature: No Abnormality Dry / Scaly: No Maceration: Yes Electronic Signature(s) Signed: 08/14/2022 4:48:54 PM By: Zenaida Deed RN, BSN Entered By: Zenaida Deed on 08/14/2022 13:04:55 -------------------------------------------------------------------------------- Vitals Details Patient Name: Date of Service: [name]. 08/14/2022 12:30 PM Medical Record Number: [id number] Patient Account Number: [id number] Date of Birth/Sex: Treating RN: 1980-08-11 (41 y.o. [name] Primary Care Rafaella Kole: [name] Other Clinician: Referring Marinus Eicher: Treating Elieser Tetrick/Extender: [name] in Treatment: 319 Vital Signs Time Taken: 12:44 Temperature (F): 97.9 Height (in): 65 Pulse (bpm): 80 Weight (lbs): 201 Respiratory Rate (breaths/min): 18 Body Mass Index (BMI): 33.4 Blood Pressure (mmHg): 115/83 Reference Range: 80 - 120 mg / dl Electronic Signature(s) Signed: 08/14/2022 4:48:54 PM By: Zenaida Deed RN, BSN Entered By: Zenaida Deed on 08/14/2022 12:45:44

## 2022-08-16 NOTE — Progress Notes (Signed)
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 of her lower sacral wound. She comes in today with [name] really large postoperative wound. This does not have exposed bone but very close. This is right back to where this was [name] year or 2 ago.  They have been using wet-to-dry. She is on an IV antibiotic but is not sure what drugs. We are not sure what home health company they are currently using. We are trying to get records from [facility]. 6/8; this the patient return to clinic 3 [name] ago. She had surgical debridement of the lower sacral wound. She apparently is completed her IV antibiotics although I am not exactly sure what IV antibiotics she had ordered. Her PICC line is come out. Her wound is large but generally clean there still is exposed bone. Fortunately the area on the right heel is callused over I cannot prove there is an open area here per 6/22; they are using [name] wet to dry dressing when we left the clinic last time however they managed to find [name] box of silver alginate so they are using that with backing gauze. They are still changing this twice [name] day because of drainage issues. She has Medicaid and they will not be able to replace the want dressing she will have to go back to [name] wet-to-dry. In spite of this the wound actually looks quite good some improvement in dimension 7/13; using silver alginate. Slight improvement in overall wound volume including depth although there is still undermining. She tells me she is offloading this is much as possible 8/10-Patient back at 1 month, continuing to use silver alginate although she has run out and therefore using saline wet-to-dry, undermining is minimal, continuing attempts at offloading according to patient 9/21; its been about 9 [name] since I have seen this patient although I note she was seen once in August. Her wounds on the lower sacrum this looks [name] lot better than the last time I saw this. She is using silver alginate to the wound bed. They only have Medicaid  therefore they have to need to pay out-of-pocket for the dressing supplies. This certainly limits options. 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 10/19; 1 month follow-up. Not much change in the wound in fact it is measuring slightly larger. Still using silver alginate which her mother is purchasing off [employer] I believe. Since she was here she had [name] suprapubic catheter placed because of [name] lacerated urethra. 11/30; patient has not been here in about 6 [name]. She apparently has had 3 admissions to the hospital for pneumonia in the last 7 months 2 in the last 2 months. She came out of hospital this time with 4 L of oxygen. Her mother is using the Anasept wet-to-dry to the sacral wound and surprisingly this looks somewhat better. The patient states she is pending 24/7 in bed except for going to doctor's appointments this may be helping. She has an appointment with pulmonology on 12/17. She was [name] heavy smoker for [name] period of time I wonder whether she has COPD 12/28; patient is doing well she is off her oxygen which may have something to do with chronic Macrobid she was on [hypersensitivity pneumonitis]. She is also stop smoking. In any case she is doing well from [name] breathing point of view. She is using Anasept wet-to-dry. 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 She has been using Anasept gel wet-to-dry and really has done [name] nice job here. She does not have insurance for other wound care products but truthfully so far this is done [name] really good job. I note she is back on oxygen. 2/22; stage IV wound on her lower sacrum. They have been to using an Anasept gel wet-to-dry-based dressing. Ordinarily I would like to use [name] moistened collagen wet-to-dry but she is apparently not able to afford this. There was also some suggestion about using Dakin's but apparently her mother could not make 3/14; 3-week follow-up. She is going for bladder surgery at [facility] next week. Still using Anasept gel wet-to-dry. We will try to get Prisma wet to dry through what I think is [facility].  This probably will not work 4/19; 4-week follow-up. She is using collagen with wet-to-dry dressings every day. She reports improvement to the wound healing. Patient had bladder neck surgery with suprapubic catheter placement on 3/22. On 3/30 she was sent to the emergency department because of hypotension. She was found to be in septic shock in the setting of ESBL E. coli UTI. Currently she is doing well. 5/17; 4-week follow-up. She is using collagen with backing wet-to-dry. Really nice improvement in surface area of these wounds depth at 1.2 cm. She has had problems with urinary leakage. She does have [name] suprapubic catheter 6/14; 4-week follow-up. She was doing really well the last time we saw her in the wound had filled in quite [name] bit. However since that time her wound is gotten [name] lot deeper. Apparently her bladder surgery failed and she is incontinent [name] lot of the time. Furthermore her mother suffered [name] stroke and is staying with her sister. She now has care attendance but I am not really certain about the amount of care she has. We have been using silver collagen but apparently the family has to buy this privately. 6/28; the wound measures slightly larger I certainly do not see the difference. It has 1.5 cm of direct  depth but not exposed to bone it does not appear to be infected. We had her using silver alginate although she does not seem to know what her mother's been dressing this with recently 7/12; 2-week follow-up. 1.7 mm of direct depth this is fluctuated [name] fair amount but I do not see any consistent trend towards closure. The tissue looks healthy minimal undermining no palpable bone. No evidence of surrounding infection. We have been using silver alginate wet-to-dry although the patient wants to go back to Anasept gel wet to dry 8/30; we have seen this patient in quite [name] while however her wound has come in nicely at roughly 0.8 or 0.9 mm of direct depth also down in length and width. She is  using Anasept wet-to-dry her mother is changing the dressing 9/27; 1 month follow-up. Since she is being here last she apparently has had an attempt to close her urethra by urology in [facility] this was temporarily successful but now is reopened.. We have been using Anasept wet-to-dry. Her variant of Medicaid will not pay for wound care supplies. Most of what the use is being paid for out of their own pocket 10/25; 1 month follow-up. Her wound is measuring better she is still using Anasept wet-to-dry. Her mother changes this twice [name] day because of drainage. Overall the wound dimensions are better. The patient states that her recent urologic procedure actually resulted in less incontinence which may be helping Apparently had her mattress overlay on her bed is disintegrating. She asked if I be willing to put in an order for replacement I told her we would try her best although I am not exactly sure how long Medicaid will allow for replacement 11/29; 1 month follow-up. She arrives with her mother who is her primary caregiver. There is still using Anasept wet-to-dry but her mother says because of drainage they are changing this 3 times [name] day. Dimensions are [name] little worse or as last time she was actually [name] little better 12/13; the patient's wound had deteriorated last time. I did [name] culture of the wound that showed of few rare Pseudomonas and [name] few rare Corynebacterium. The Corynebacterium is likely [name] skin contaminant. I did prescribe topical gentamicin under the silver alginate directed at the Pseudomonas. Her mother says that there is [name] lot of drainage she changes the odor gauze packing 3 times [name] day currently using 6 gentamicin under silver alginate covering all of this with ABDs 03/13/2021; patient is using silver alginate. Very little change in this wound this actually goes down to bone with [name] rim of tissue separating environment from underlying sacral bone. There is no real granulation. At the base  of this. She has not been systemically unwell. Jamie [name], GAULKE [name] ([id number]) [id number] Page 5 of 16 The wound itself not much changed however it abuts right on bone. She has Medicaid which does not give [place] [name] lot of options for either home care or different dressings. We thought this over in some detail. We might be able to get [name] wound VAC through Medicaid but we would not be able to get this changed by home health. She lives in [place] which she would be [name] far drive to this clinic twice [name] week. We might be able to teach the daughter or friend how to do this but there is [name] lot of issues that need to be worked through before we put this into the final ordering status 03/28/2021; the wound actually looks somewhat better  contracted. There is still some undermining but no evidence of infection. We have been using gentamicin and silver alginate. 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 She is apparently going for some form of tendon release next week by orthopedics. Her knees are fixed in extension right leg first apparently 2/16; the wound looks clean not much change in dimensions. She has undermining at roughly 4:00. There is no exposed bone. We are using silver alginate with underlying gentamicin. The last culture I did of this in December showed Pseudomonas which is the reason for the gentamicin topical Since patient was last here she had quadricep tendon release surgery at [facility] 05/09/2021: There has been improvement overall in the wound. The base is fairly clean with minimal slough. The size has contracted.[name] Unfortunately, one of her wounds from her quadricep tendon release is open and there is extensive nonviable tissue. She reports that she is going to have to have this operatively debrided. 06/06/2021: Since her last visit in clinic, she underwent debridement of the open wound from her quadricep tendon  release with placement of Kerecis and primary closure. Her sutures have been removed and she saw her surgeon last week. In the interim, [name] clip from her suprapubic catheter caused [name] new wound to occur on her right thigh and her old left heel pressure ulcer has reopened. Her sacral wound is smaller. None of the wounds appears infected, but there is epibole around the sacral wound and fairly thick slough on the heel wound. 06/20/2021: The heel ulcer is clean with good granulation tissue. The sacral wound is smaller but measures [name] little deeper today. There is heaped up senescent skin around the edges of the sacral wound. The wound on her thigh is [name] little bit bigger but remains fairly superficial. 07/04/2021: The heel ulcer is nearly completely healed. Very little open tissue. The sacral wound continues to contract around the cavity. There is [name] lot of heaped up senescent tissue around the edges of the wound. The wound on her thigh has not really made much improvement at all. It remains superficial but has [name] thick layer of yellow slough. No concern for infection in any of the wound sites. 07/18/2021: The heel ulcer has closed. The sacral wound is shallower with [name] little bit of slough in the wound base. There is less senescent tissue around the perimeter. The wound on her thigh looks like it might be [name] little deeper in the center. It continues to have [name] fibrous slough layer. No concern for infection. 07/25/2021: We initiated wound VAC therapy last week. The sacral wound is smaller but still has some undermining. There is just [name] little bit of slough in the wound base. The wound on her thigh we began treating with Santyl. The fibrinous slough has diminished and there is some granulation tissue beginning to form. 08/01/2021: For some reason, the sacral wound measures deeper today. It is fairly clean and there is no significant odor from the wound, but the patient's mother reports an increase in the drainage. She has  struggled with maintaining [name] good seal with the drape, given the location of the wound. On the other hand, the thigh wound is smaller and shallower. There is just some fibrinous slough on the surface. 08/09/2021: The patient was hospitalized last week with [name] urinary tract infection. She is still feeling [name] bit poorly today. She is currently taking [name] course of oral antibiotics. The sacral wound is  [name] little bit smaller today. It does have [name] layer of slough on the surface. The wound on her thigh continues to contract and is fairly superficial today. There is fibrinous slough at the site, as well. 08/23/2021: The patient was hospitalized again this last week with sepsis. It sounds like urology is planning to perform [name] cystectomy with ileal conduit to help with her urinary leakage issues. She apparently developed [name] cutaneous fungal infection where her wound VAC drape was and so that has been removed and her mom is doing wet-to-dry dressing changes. The periwound skin is now healing, but it is still fairly red and irritated. She has [name] deep tissue injury on the same heel that we had previously treated for an open ulcer, but there is currently no skin opening. The wound on her thigh is very superficial and clean without any slough accumulation. 08/30/2021: The heel looks good today and has not opened. The wound on her thigh is closed. The sacral wound looks better, particularly the periwound skin. Her mother has been applying [name] mixture of Desitin and miconazole to the periwound and just doing wet-to-dry dressings because we did not want to further irritate the skin with the wound VAC drape. The wound is clean with just [name] little bit of superficial slough and senescent skin heaped up around the margin. 09/05/2021: The periwound skin at her sacral ulcer site is markedly improved. Immediately surrounding the wound orifice, there is some thick, dried, and scaly skin. There is also some slough buildup on the wound surface.  No odor or significant drainage. 7/14; the patient still has the same depth however surrounding granulation looks better. Using wound VAC with topical Keystone antibiotics. She may be eligible for an improved mattress cover and we will look into it 10/11/2021: The wound is [name] little bit deeper today. She is struggling with urinary contamination of her sacral wound and has subsequently developed maceration and some tissue breakdown. No obvious external infection. There is good granulation tissue on the surface with just [name] little bit of light slough. 10/25/2021: The wound looks better today. The depth has come in by over [name] centimeter. Her skin is in better condition. There is heaped up senescent skin around the wound perimeter. She received her level 3 bed and is very happy with it. 11/08/2021: The undermining has come in substantially. The overall wound depth is about the same. She continues to build up senescent skin around the wound perimeter. 11/22/2021: The wound apparently measured [name] little bit deeper, but it no longer undermines much at all. It is clean. There is senescent skin accumulation around the perimeter, as usual. 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 This was complicated by sepsis secondary to pneumonia. Her wound actually looks quite good. The size is about the same, but the depth has come in considerably and there is less undermining. It is [name] little bit dry around the edges. No concern for infection. 03/17/2022: The patient has been in and out of the hospital since her last visit, primarily with episodes of sepsis related to urinary infections. They have been having issues with her urostomy bags leaking having issues and the patient's mother is concerned that some of the contaminated urine may be reaching her sacral ulcer, which has enlarged since the last time we saw her. On  inspection, the wound is [name] little larger, but it is very clean with just some slough  and accumulation of senescent skin around the margins. 04/16/2022: No real change to the sacral ulcer. It is clean without any significant slough or other debris accumulation. No concern for infection. 05/22/2022: The patient has been in the hospital and she was not on an appropriate surface for [name] time. She has also had some leakage from her urostomy due to the hospital not having the correct bags. Finally, her air-fluidized mattress has been leaking and was only just repaired. As result of these issues, her sacral ulcer is [name] little bit bigger, but it remains quite clean with good granulation tissue at the base. 06/19/2022: Her wound is smaller and shallower this week. There is [name] little bit of slough on the surface and some senescent skin around the edges. 07/17/2022: The wound has contracted further and is shallower again today. There is light slough on the wound surface and reaccumulation of the senescent skin around the edges. Jamie [name], Jamie [name] ([id number]) [id number] Page 6 of 16 08/14/2022: Although the wound measurements have not changed, the cavity has actually contracted quite [name] bit. There is some slough on the surface and reaccumulation of senescent skin around the wound margins. Electronic Signature(s) Signed: 08/14/2022 1:25:07 PM By: Jamie Guess MD FACS Entered By: Jamie [name] on 08/14/2022 13:25:06 -------------------------------------------------------------------------------- Physical Exam Details Patient Name: Date of Service: Jamie [name], Jamie [name]. 08/14/2022 12:30 PM Medical Record Number: [id number] Patient Account Number: [id number] Date of Birth/Sex: Treating RN: [birth year] (41 y.o. F) Primary Care Provider: Delynn [name] Other Clinician: Referring Provider: Treating Provider/Extender: Tomasa Hosteller, Kathie Rhodes [name] in Treatment:  [phone] Constitutional . . . . no acute distress. Respiratory Normal work of breathing on room air. Notes 08/14/2022: Although the wound measurements have not changed, the cavity has actually contracted quite [name] bit. There is some slough on the surface and reaccumulation of senescent skin around the wound margins. Electronic Signature(s) Signed: 08/14/2022 1:25:45 PM By: Jamie Guess MD FACS Entered By: Jamie [name] on 08/14/2022 13:25:44 -------------------------------------------------------------------------------- Physician Orders Details Patient Name: Date of Service: Jamie [name]. 08/14/2022 12:30 PM Medical Record Number: [id number] Patient Account Number: [id number] Date of Birth/Sex: Treating RN: [birth year] (41 y.o. [name] Primary Care Provider: Delynn [name] Other Clinician: Referring Provider: Treating Provider/Extender: Jamie [name] in Treatment: [phone] Verbal / Phone Orders: No Diagnosis Coding ICD-10 Coding Code Description L89.154 Pressure ulcer of sacral region, stage 4 G82.22 Paraplegia, incomplete Follow-up Appointments Return appointment in 1 month. - Jamie [name] Room 1 ***HOYER*** Anesthetic Wound #1 Medial Sacrum (In clinic) Topical Lidocaine 4% applied to wound bed Bathing/ Shower/ Hygiene May shower and wash wound with soap and water. Jamie [name], Jamie [name] ([id number]) [id number] Page 7 of 16 Off-Loading [name] fluidized (Group 3) mattress - medical modalities ir Turn and reposition every 2 hours Wound Treatment Wound #1 - Sacrum Wound Laterality: Medial Cleanser: Soap and Water 1 x Per Day/30 Days Discharge Instructions: May shower and wash wound with dial antibacterial soap and water prior to dressing change. Cleanser: Anasept Antimicrobial Skin and Wound Cleanser, 8 (oz) 1 x Per Day/30 Days Discharge Instructions: Cleanse the wound with Anasept cleanser prior to applying [name] clean  dressing using gauze sponges, not tissue or cotton balls. Peri-Wound Care: Zinc Oxide Ointment 30g tube 1 x Per Day/30 Days Discharge Instructions: Apply Zinc Oxide to periwound as needed for moisture Prim Dressing: Promogran Prisma Matrix, 4.34 (sq in) (silver collagen) 1 x Per Day/30 Days ary Discharge Instructions: Moisten collagen with saline or hydrogel and lay in  bottom of wound bed. Prim Dressing: VASHE 1 x Per Day/30 Days ary Discharge Instructions: moisten gauze with Vashe and pack into wound Secondary Dressing: Zetuvit Plus Silicone Border Dressing 5x5 (in/in) 1 x Per Day/30 Days Discharge Instructions: Apply silicone border over primary dressing as directed. Electronic Signature(s) Signed: 08/14/2022 2:09:34 PM By: Jamie Guess MD FACS Entered By: Jamie [name] on 08/14/2022 13:29:30 -------------------------------------------------------------------------------- Problem List Details Patient Name: Date of Service: Jamie [name]. 08/14/2022 12:30 PM Medical Record Number: [id number] Patient Account Number: [id number] Date of Birth/Sex: Treating RN: 1981-02-28 (41 y.o. [name] Primary Care Provider: Delynn [name] Other Clinician: Referring Provider: Treating Provider/Extender: Jamie [name] in Treatment: [phone] Active Problems ICD-10 Encounter Code Description Active Date MDM Diagnosis L89.154 Pressure ulcer of sacral region, stage 4 06/27/2016 No Yes G82.22 Paraplegia, incomplete 06/27/2016 No Yes Inactive Problems ICD-10 Code Description Active Date Inactive Date L97.312 Non-pressure chronic ulcer of right 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 01/26/2017 01/26/2017 L97.322 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 01/26/2017 01/26/2017 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, sacral and sacrococcygeal region 06/27/2016 06/27/2016 T81.31XA Disruption of external operation (surgical) wound, not elsewhere classified,  initial 09/12/2016 09/12/2016 Jamie [name], Jamie [name] ([id number]) [phone].pdf Page 8 of 16 encounter [phone] Non-pressure chronic ulcer of 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 11/06/2017 11/06/2017 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 11/20/2017 11/20/2017 Q03.474 Pressure ulcer of left heel, stage 3 08/09/2019 08/09/2019 Resolved Problems ICD-10 Code Description Active Date Resolved Date L89.624 Pressure ulcer of left heel, stage 4 06/27/2016 06/27/2016 L97.811 Non-pressure chronic ulcer of other part of right lower leg limited to breakdown of skin 09/20/2018 09/20/2018 L89.620 Pressure ulcer of left heel, unstageable 05/13/2017 05/13/2017 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 08/14/2022 12:30 PM Medical Record Number: [id number] Patient Account Number: [id number] Date of Birth/Sex: Treating RN: Sep 22, 1980 (41 y.o. F) Primary Care Provider: Delynn [name] Other Clinician: Referring Provider: Treating Provider/Extender: Jamie [name] in Treatment: 319 Subjective Chief Complaint Information obtained from Patient patient is here for follow up evaluation of [name] History of Present Illness (HPI) 06/27/16; this is an unfortunate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

## 2022-09-01 NOTE — Progress Notes (Signed)
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 Description L89.154 Pressure ulcer of sacral region, 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

## 2022-09-02 ENCOUNTER — Telehealth: Payer: Self-pay | Admitting: Family Medicine

## 2022-09-02 NOTE — Telephone Encounter (Signed)
Spoke with pharmacy and she states nothing else needs to be done.  Was giving an error message earlier but it is fine now.

## 2022-09-02 NOTE — Telephone Encounter (Signed)
Mother came in to office to let pcp know that MEDICAID is say that pt cannot get Oxycodone HCl 10 MG TABS  rx refill due to error--Quantity and day supply is incorrect for the rx, [place] is showing an error message something not matching. Mother says that this happened last month too.  [facility] says that 90 day supply 3x day 90 pills Please call back

## 2022-09-02 NOTE — Telephone Encounter (Signed)
Can we call the pharmacy and see what is needed please?

## 2022-09-11 ENCOUNTER — Encounter (HOSPITAL_BASED_OUTPATIENT_CLINIC_OR_DEPARTMENT_OTHER): Payer: Medicaid Other | Attending: General Surgery | Admitting: General Surgery

## 2022-09-11 DIAGNOSIS — L89154 Pressure ulcer of sacral region, stage 4: Secondary | ICD-10-CM | POA: Diagnosis present

## 2022-09-11 DIAGNOSIS — G8222 Paraplegia, incomplete: Secondary | ICD-10-CM | POA: Insufficient documentation

## 2022-09-11 DIAGNOSIS — Z87891 Personal history of nicotine dependence: Secondary | ICD-10-CM | POA: Diagnosis not present

## 2022-09-11 NOTE — Progress Notes (Addendum)
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 No; Heart Disease: Yes -  Mother,Maternal Grandparents; Hypertension: Yes - Maternal Grandparents,Mother; Kidney Disease: Yes - Maternal Grandparents; Former smoker - quit 2 yrs ago; Marital Status - Single; Financial Concerns: No; Food, Civil Service fast streamer or Shelter Needs: No; Support System Lacking: No; Transportation  Concerns: No Psychologist, prison and probation services) Signed: 09/11/2022 2:37:15 PM By: Duanne Guess MD FACS Signed: 09/11/2022 3:51:17 PM By: Zenaida Deed RN, BSN Entered By: Duanne Guess on 09/11/2022 14:28:28 -------------------------------------------------------------------------------- SuperBill Details Patient Name: Date of Service: [name]. 09/11/2022 Medical Record Number: [id number] Patient Account Number: [id number] Date of Birth/Sex: Treating RN: February 13, 1981 (41 y.o. 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, incomplete Facility Procedures : CPT4 Code: [id number] Description: 97597 - DEBRIDE WOUND 1ST 20 SQ CM OR < ICD-10 Diagnosis Description L89.154 Pressure ulcer of sacral region, stage 4 Modifier: Quantity: 1 Physician Procedures : CPT4 Code Description Modifier [id number] 99214 - WC PHYS LEVEL 4 - EST PT 25 ICD-10 Diagnosis Description L89.154 Pressure ulcer of sacral region, 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

## 2022-09-11 NOTE — Progress Notes (Signed)
ILAH, BOULE A ([id number]) [id number] Page 1 of 8 Visit Report for 09/11/2022 Arrival Information Details Patient Name: Date of Service: [name], [name] 09/11/2022 1:15 PM Medical Record Number: [id number] Patient Account Number: [id number] Date of Birth/Sex: Treating RN: [birth year] (42 y.o. [name]: Delynn Flavin Other Clinician: Referring Bridgitt Raggio: Treating Blanche Scovell/Extender: [name] in Treatment: 323 Visit Information History Since Last Visit Added or deleted any medications: No Patient Arrived: Wheel Chair Any new allergies or adverse reactions: No Arrival Time: 13:25 Had a fall or experienced change in No Accompanied By: caregiver activities of daily living that may affect Transfer Assistance: None risk of falls: Patient Identification Verified: Yes Signs or symptoms of abuse/neglect since last visito No Secondary Verification Process Completed: Yes Hospitalized since last visit: No Patient Requires Transmission-Based Precautions: No Implantable device outside of the clinic excluding No Patient Has Alerts: No cellular tissue based products placed in the center since last visit: Has Dressing in Place as Prescribed: Yes Pain Present Now: Yes Electronic Signature(s) Signed: 09/11/2022 3:51:17 PM By: Zenaida Deed RN, BSN Entered By: Zenaida Deed on 09/11/2022 13:26:17 -------------------------------------------------------------------------------- Encounter Discharge Information Details Patient Name: Date of Service: [name] 09/11/2022 1:15 PM Medical Record Number: [id number] Patient Account Number: [id number] Date of Birth/Sex: Treating RN: 08/07/1980 (42 y.o. [name]: Delynn Flavin Other Clinician: Referring Mazal Ebey: Treating Lantz Hermann/Extender: [name] in Treatment:  [phone] Encounter Discharge Information Items Post Procedure Vitals Discharge Condition: Stable Temperature (F): 97.8 Ambulatory Status: Wheelchair Pulse (bpm): 83 Discharge Destination: Home Respiratory Rate (breaths/min): 18 Transportation: Other Blood Pressure (mmHg): 129/86 Accompanied By: mother and caregiver Schedule Follow-up Appointment: Yes Clinical Summary of Care: Patient Declined Electronic Signature(s) Signed: 09/11/2022 3:51:17 PM By: Zenaida Deed RN, BSN Entered By: Zenaida Deed on 09/11/2022 14:27:55 Fonnie Mu A ([id number]) [id number] Page 2 of 8 -------------------------------------------------------------------------------- Lower Extremity Assessment Details Patient Name: Date of Service: [name], [name] 09/11/2022 1:15 PM Medical Record Number: [id number] Patient Account Number: [id number] Date of Birth/Sex: Treating RN: [birth year] (42 y.o. [name]: Delynn Flavin Other Clinician: Referring Kennis Buell: Treating Serah Nicoletti/Extender: [name] Weeks in Treatment: 323 Electronic Signature(s) Signed: 09/11/2022 3:51:17 PM By: Zenaida Deed RN, BSN Entered By: Zenaida Deed on 09/11/2022 13:50:28 -------------------------------------------------------------------------------- Multi Wound Chart Details Patient Name: Date of Service: [name] 09/11/2022 1:15 PM Medical Record Number: [id number] Patient Account Number: [id number] Date of Birth/Sex: Treating RN: [birth year] (42 y.o. [name]: Delynn Flavin Other Clinician: Referring Sarim Rothman: Treating Frederik Standley/Extender: [name] Weeks in Treatment: 323 Vital Signs Height(in): 65 Pulse(bpm): 83 Weight(lbs): 201 Blood Pressure(mmHg): 129/86 Body Mass Index(BMI): 33.4 Temperature(F): 97.8 Respiratory Rate(breaths/min): 18 [1:Photos:]  [N/A:N/A] Medial Sacrum N/A N/A Wound Location: Pressure Injury N/A N/A Wounding Event: Pressure Ulcer N/A N/A Primary Etiology: Anemia, Osteomyelitis N/A N/A Comorbid History: 05/15/2016 N/A N/A Date Acquired: 323 N/A N/A Weeks of Treatment: Open N/A N/A Wound Status: No N/A N/A Wound Recurrence: 1.5x1.3x1.7 N/A N/A Measurements L x W x D (cm) 1.532 N/A N/A A (cm) : rea 2.604 N/A N/A Volume (cm) : 95.30% N/A N/A % Reduction in A rea: 98.20% N/A N/A % Reduction in Volume: 2 Starting Position 1 (o'clock): 4 Ending Position 1 (o'clock): 1.9 Maximum Distance 1 (cm): Yes N/A N/A Undermining: Category/Stage IV N/A N/A Classification: Medium N/A N/A Exudate A mount: Serosanguineous N/A N/A Exudate Type: red, brown N/A N/A Exudate  Color: Distinct, outline attached N/A N/A Wound Margin: JISELL, MAJER A ([id number]) [id number] Page 3 of 8 Large (67-100%) N/A N/A Granulation Amount: Red N/A N/A Granulation Quality: Small (1-33%) N/A N/A Necrotic Amount: Fat Layer (Subcutaneous Tissue): Yes N/A N/A Exposed Structures: Fascia: No Tendon: No Muscle: No Joint: No Bone: No Small (1-33%) N/A N/A Epithelialization: Debridement - Selective/Open Wound N/A N/A Debridement: Pre-procedure Verification/Time Out 14:00 N/A N/A Taken: Lidocaine 4% Topical Solution N/A N/A Pain Control: Slough N/A N/A Tissue Debrided: Skin/Epidermis N/A N/A Level: 1.53 N/A N/A Debridement A (sq cm): rea Curette N/A N/A Instrument: Minimum N/A N/A Bleeding: Pressure N/A N/A Hemostasis A chieved: Procedure was tolerated well N/A N/A Debridement Treatment Response: 1.5x1.3x1.7 N/A N/A Post Debridement Measurements L x W x D (cm) 2.604 N/A N/A Post Debridement Volume: (cm) Category/Stage IV N/A N/A Post Debridement Stage: Rash: No N/A N/A Periwound Skin Texture: Maceration: No N/A N/A Periwound Skin Moisture: Dry/Scaly: No No Abnormalities  Noted N/A N/A Periwound Skin Color: No Abnormality N/A N/A Temperature: Debridement N/A N/A Procedures Performed: Treatment Notes Wound #1 (Sacrum) Wound Laterality: Medial Cleanser Soap and Water Discharge Instruction: May shower and wash wound with dial antibacterial soap and water prior to dressing change. Anasept Antimicrobial Skin and Wound Cleanser, 8 (oz) Discharge Instruction: Cleanse the wound with Anasept cleanser prior to applying a clean dressing using gauze sponges, not tissue or cotton balls. Peri-Wound Care Zinc Oxide Ointment 30g tube Discharge Instruction: Apply Zinc Oxide to periwound as needed for moisture Topical Primary Dressing Promogran Prisma Matrix, 4.34 (sq in) (silver collagen) Discharge Instruction: Moisten collagen with saline or hydrogel and lay in bottom of wound bed. VASHE Discharge Instruction: moisten gauze with Vashe and pack into wound Secondary Dressing Zetuvit Plus Silicone Border Dressing 5x5 (in/in) Discharge Instruction: Apply silicone border over primary dressing as directed. Secured With Compression Wrap Compression Stockings Facilities manager) Signed: 09/11/2022 2:26:26 PM By: Duanne Guess MD FACS Signed: 09/11/2022 3:51:17 PM By: Zenaida Deed RN, BSN Entered By: Duanne Guess on 09/11/2022 14:26:25 Fonnie Mu A ([id number]) [id number] Page 4 of 8 -------------------------------------------------------------------------------- Multi-Disciplinary Care Plan Details Patient Name: Date of Service: TASHIYA, SOUDERS 09/11/2022 1:15 PM Medical Record Number: [id number] Patient Account Number: [id number] Date of Birth/Sex: Treating RN: 04-21-1980 (41 y.o. Jamie Standard Primary Care Nathen Balaban: Delynn Flavin Other Clinician: Referring Aseel Uhde: Treating Maely Clements/Extender: [name] Weeks in Treatment: 323 Multidisciplinary Care Plan reviewed with  physician Active Inactive Pressure Nursing Diagnoses: Knowledge deficit related to management of pressures ulcers Potential for impaired tissue integrity related to pressure, friction, moisture, and shear Goals: Patient will remain free from development of additional pressure ulcers Date Initiated: 06/27/2016 Date Inactivated: 01/17/2019 Target Resolution Date: 12/24/2018 Goal Status: Met Patient/caregiver will verbalize risk factors for pressure ulcer development Date Initiated: 06/27/2016 Date Inactivated: 07/08/2017 Target Resolution Date: 07/08/2017 Goal Status: Met Patient/caregiver will verbalize understanding of pressure ulcer management Date Initiated: 06/27/2016 Target Resolution Date: 10/09/2022 Goal Status: Active Interventions: Assess: immobility, friction, shearing, incontinence upon admission and as needed Assess offloading mechanisms upon admission and as needed Assess potential for pressure ulcer upon admission and as needed Provide education on pressure ulcers Treatment Activities: Patient referred for pressure reduction/relief devices : 06/27/2016 Pressure reduction/relief device ordered : 06/27/2016 Notes: Wound/Skin Impairment Nursing Diagnoses: Impaired tissue integrity Knowledge deficit related to smoking impact on wound healing Knowledge deficit related to ulceration/compromised skin integrity Goals: Patient will demonstrate a reduced rate of smoking or cessation of smoking Date Initiated: 06/27/2016 Date Inactivated:  01/26/2017 Target Resolution Date: 01/08/2017 Goal Status: Unmet Unmet Reason: pt continues to smoke Patient/caregiver will verbalize understanding of skin care regimen Date Initiated: 06/27/2016 Target Resolution Date: 10/09/2022 Goal Status: Active Ulcer/skin breakdown will have a volume reduction of 50% by week 8 Date Initiated: 06/27/2016 Date Inactivated: 12/11/2016 Target Resolution Date: 07/25/2016 Goal Status: Met Interventions: Assess  patient/caregiver ability to obtain necessary supplies Assess patient/caregiver ability to perform ulcer/skin care regimen upon admission and as needed Assess ulceration(s) every visit Provide education on smoking Provide education on ulcer and skin care Treatment Activities: TELMA, PYEATT ([id number]) [id number] Page 5 of 8 Skin care regimen initiated : 06/27/2016 Topical wound management initiated : 06/27/2016 Notes: Electronic Signature(s) Signed: 09/11/2022 3:51:17 PM By: Zenaida Deed RN, BSN Entered By: Zenaida Deed on 09/11/2022 13:27:03 -------------------------------------------------------------------------------- Pain Assessment Details Patient Name: Date of Service: JUNE, RODE A. 09/11/2022 1:15 PM Medical Record Number: [id number] Patient Account Number: [id number] Date of Birth/Sex: Treating RN: [birth year] (42 y.o. Jamie Standard Primary Care Isla Sabree: Delynn Flavin Other Clinician: Referring Remas Sobel: Treating Etheridge Geil/Extender: [name] Weeks in Treatment: 323 Active Problems Location of Pain Severity and Description of Pain Patient Has Paino Yes Site Locations Pain Location: Generalized Pain With Dressing Change: No Duration of the Pain. Constant / Intermittento Constant Rate the pain. Current Pain Level: 7 Worst Pain Level: 9 Least Pain Level: 1 Character of Pain Describe the Pain: Aching Pain Management and Medication Current Pain Management: Medication: Yes Is the Current Pain Management Adequate: Adequate How does your wound impact your activities of daily livingo Sleep: No Bathing: No Appetite: No Relationship With Others: No Bladder Continence: No Emotions: No Bowel Continence: No Work: No Toileting: No Drive: No Dressing: No Hobbies: No Electronic Signature(s) Signed: 09/11/2022 3:51:17 PM By: Zenaida Deed RN, BSN Entered By: Zenaida Deed on 09/11/2022  13:36:23 Fonnie Mu A ([id number]) [id number] Page 6 of 8 -------------------------------------------------------------------------------- Patient/Caregiver Education Details Patient Name: Date of Service: ZAHRA, PEFFLEY 7/11/2024andnbsp1:15 PM Medical Record Number: [id number] Patient Account Number: [id number] Date of Birth/Gender: Treating RN: 07-31-1980 (41 y.o. Jamie Standard Primary Care Physician: Delynn Flavin Other Clinician: Referring Physician: Treating Physician/Extender: [name] in Treatment: 323 Education Assessment Education Provided To: Patient Education Topics Provided Pressure: Methods: Explain/Verbal Responses: Reinforcements needed, State content correctly Wound/Skin Impairment: Methods: Explain/Verbal Responses: Reinforcements needed, State content correctly Electronic Signature(s) Signed: 09/11/2022 3:51:17 PM By: Zenaida Deed RN, BSN Entered By: Zenaida Deed on 09/11/2022 13:27:28 -------------------------------------------------------------------------------- Wound Assessment Details Patient Name: Date of Service: GENEEN, DIETER A. 09/11/2022 1:15 PM Medical Record Number: [id number] Patient Account Number: [id number] Date of Birth/Sex: Treating RN: 27-Jul-1980 (41 y.o. Jamie Standard Primary Care Moncerrath Berhe: Delynn Flavin Other Clinician: Referring Ossiel Marchio: Treating Wayman Hoard/Extender: [name] Weeks in Treatment: 323 Wound Status Wound Number: 1 Primary Etiology: Pressure Ulcer Wound Location: Medial Sacrum Wound Status: Open Wounding Event: Pressure Injury Comorbid History: Anemia, Osteomyelitis Date Acquired: 05/15/2016 Weeks Of Treatment: 323 Clustered Wound: No Photos TANIKKA, BRESNAN A ([id number]) [id number] Page 7 of 8 Wound Measurements Length: (cm) 1.5 Width: (cm) 1.3 Depth: (cm) 1.7 Area:  (cm) 1.532 Volume: (cm) 2.604 % Reduction in Area: 95.3% % Reduction in Volume: 98.2% Epithelialization: Small (1-33%) Tunneling: No Undermining: Yes Starting Position (o'clock): 2 Ending Position (o'clock): 4 Maximum Distance: (cm) 1.9 Wound Description Classification: Category/Stage IV Wound Margin: Distinct, outline attached Exudate Amount: Medium Exudate Type: Serosanguineous Exudate Color: red, brown Foul Odor After Cleansing: No Slough/Fibrino No Wound Bed Granulation  Amount: Large (67-100%) Exposed Structure Granulation Quality: Red Fascia Exposed: No Necrotic Amount: Small (1-33%) Fat Layer (Subcutaneous Tissue) Exposed: Yes Necrotic Quality: Adherent Slough Tendon Exposed: No Muscle Exposed: No Joint Exposed: No Bone Exposed: No Periwound Skin Texture Texture Color No Abnormalities Noted: Yes No Abnormalities Noted: Yes Moisture Temperature / Pain No Abnormalities Noted: No Temperature: No Abnormality Dry / Scaly: No Maceration: No Treatment Notes Wound #1 (Sacrum) Wound Laterality: Medial Cleanser Soap and Water Discharge Instruction: May shower and wash wound with dial antibacterial soap and water prior to dressing change. Anasept Antimicrobial Skin and Wound Cleanser, 8 (oz) Discharge Instruction: Cleanse the wound with Anasept cleanser prior to applying a clean dressing using gauze sponges, not tissue or cotton balls. Peri-Wound Care Zinc Oxide Ointment 30g tube Discharge Instruction: Apply Zinc Oxide to periwound as needed for moisture Topical Primary Dressing Promogran Prisma Matrix, 4.34 (sq in) (silver collagen) Discharge Instruction: Moisten collagen with saline or hydrogel and lay in bottom of wound bed. VASHE Discharge Instruction: moisten gauze with Vashe and pack into wound Secondary Dressing Zetuvit Plus Silicone Border Dressing 5x5 (in/in) Discharge Instruction: Apply silicone border over primary dressing as directed. Secured  With Compression Wrap Compression Stockings Facilities manager) Signed: 09/11/2022 3:51:17 PM By: Zenaida Deed RN, BSN Entered By: Zenaida Deed on 09/11/2022 13:57:16 Fonnie Mu A ([id number]) [id number] Page 8 of 8 -------------------------------------------------------------------------------- Vitals Details Patient Name: Date of Service: IRISA, GRIMSLEY 09/11/2022 1:15 PM Medical Record Number: [id number] Patient Account Number: [id number] Date of Birth/Sex: Treating RN: 1980/06/28 (41 y.o. Jamie Standard Primary Care Zabria Liss: Delynn Flavin Other Clinician: Referring Providencia Hottenstein: Treating Carlea Badour/Extender: [name] Weeks in Treatment: 323 Vital Signs Time Taken: 13:35 Temperature (F): 97.8 Height (in): 65 Pulse (bpm): 83 Weight (lbs): 201 Respiratory Rate (breaths/min): 18 Body Mass Index (BMI): 33.4 Blood Pressure (mmHg): 129/86 Reference Range: 80 - 120 mg / dl Electronic Signature(s) Signed: 09/11/2022 3:51:17 PM By: Zenaida Deed RN, BSN Entered By: Zenaida Deed on 09/11/2022 13:35:22

## 2022-09-17 ENCOUNTER — Encounter: Payer: Self-pay | Admitting: Family Medicine

## 2022-09-17 ENCOUNTER — Other Ambulatory Visit: Payer: Self-pay | Admitting: Family Medicine

## 2022-09-17 ENCOUNTER — Ambulatory Visit (INDEPENDENT_AMBULATORY_CARE_PROVIDER_SITE_OTHER): Payer: Medicaid Other | Admitting: Family Medicine

## 2022-09-17 VITALS — BP 131/90 | HR 83 | Temp 98.5°F | Ht 66.0 in

## 2022-09-17 DIAGNOSIS — N898 Other specified noninflammatory disorders of vagina: Secondary | ICD-10-CM

## 2022-09-17 DIAGNOSIS — S34101D Unspecified injury to L1 level of lumbar spinal cord, subsequent encounter: Secondary | ICD-10-CM | POA: Diagnosis not present

## 2022-09-17 DIAGNOSIS — E039 Hypothyroidism, unspecified: Secondary | ICD-10-CM

## 2022-09-17 DIAGNOSIS — Z8639 Personal history of other endocrine, nutritional and metabolic disease: Secondary | ICD-10-CM

## 2022-09-17 DIAGNOSIS — G40909 Epilepsy, unspecified, not intractable, without status epilepticus: Secondary | ICD-10-CM

## 2022-09-17 DIAGNOSIS — L700 Acne vulgaris: Secondary | ICD-10-CM

## 2022-09-17 DIAGNOSIS — R52 Pain, unspecified: Secondary | ICD-10-CM

## 2022-09-17 DIAGNOSIS — T1490XS Injury, unspecified, sequela: Secondary | ICD-10-CM

## 2022-09-17 DIAGNOSIS — G8929 Other chronic pain: Secondary | ICD-10-CM

## 2022-09-17 MED ORDER — CLINDAMYCIN PHOSPHATE 1 % EX SWAB
CUTANEOUS | 3 refills | Status: DC
Start: 2022-09-17 — End: 2022-10-10

## 2022-09-17 MED ORDER — OXYCODONE HCL 10 MG PO TABS
ORAL_TABLET | ORAL | 0 refills | Status: DC
Start: 2022-10-02 — End: 2022-10-10

## 2022-09-17 MED ORDER — OXYCODONE HCL 10 MG PO TABS
10.0000 mg | ORAL_TABLET | Freq: Three times a day (TID) | ORAL | 0 refills | Status: DC
Start: 2022-11-01 — End: 2022-12-22

## 2022-09-17 MED ORDER — OXYCODONE HCL 10 MG PO TABS
ORAL_TABLET | ORAL | 0 refills | Status: DC
Start: 2022-12-01 — End: 2022-10-10

## 2022-09-17 MED ORDER — VITAMIN D3 50 MCG (2000 UT) PO TABS
1.0000 | ORAL_TABLET | Freq: Every day | ORAL | 1 refills | Status: DC
Start: 2022-09-17 — End: 2023-03-23
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
## 2022-09-18 ENCOUNTER — Other Ambulatory Visit: Payer: Medicaid Other

## 2022-09-18 ENCOUNTER — Other Ambulatory Visit: Payer: Self-pay | Admitting: Family Medicine

## 2022-09-18 DIAGNOSIS — E039 Hypothyroidism, unspecified: Secondary | ICD-10-CM

## 2022-09-18 LAB — WET PREP FOR TRICH, YEAST, CLUE
Clue Cell Exam: POSITIVE — AB
Trichomonas Exam: NEGATIVE
Yeast Exam: NEGATIVE

## 2022-09-18 LAB — T4, FREE: Free T4: 1.3 ng/dL (ref 0.82–1.77)

## 2022-09-18 LAB — TSH: TSH: 2.13 u[IU]/mL (ref 0.450–4.500)

## 2022-09-18 MED ORDER — LEVOTHYROXINE SODIUM 88 MCG PO TABS
88.0000 ug | ORAL_TABLET | Freq: Every day | ORAL | 3 refills | Status: DC
Start: 2022-09-18 — End: 2023-09-23

## 2022-09-19 ENCOUNTER — Other Ambulatory Visit: Payer: Self-pay | Admitting: Family Medicine

## 2022-09-19 DIAGNOSIS — B9689 Other specified bacterial agents as the cause of diseases classified elsewhere: Secondary | ICD-10-CM

## 2022-09-19 MED ORDER — METRONIDAZOLE 500 MG PO TABS
500.0000 mg | ORAL_TABLET | Freq: Two times a day (BID) | ORAL | 0 refills | Status: DC
Start: 2022-09-19 — End: 2022-09-26

## 2022-09-24 ENCOUNTER — Telehealth: Payer: Self-pay | Admitting: Family Medicine

## 2022-09-24 DIAGNOSIS — T1490XS Injury, unspecified, sequela: Secondary | ICD-10-CM

## 2022-09-24 DIAGNOSIS — B9689 Other specified bacterial agents as the cause of diseases classified elsewhere: Secondary | ICD-10-CM

## 2022-09-24 NOTE — Telephone Encounter (Signed)
I'd like to refer to OB.GYN.  is she amenable and does she have a preference for ob?

## 2022-09-24 NOTE — Telephone Encounter (Signed)
Pts mom called to let PCP know that she is almost finished taking her medication and says stuff is still coming out of her private area and needs advise on next steps.

## 2022-09-25 NOTE — Telephone Encounter (Signed)
Mother called back to reminder referral scheduler that pt needs 5 days to set up transportation with [facility].

## 2022-09-26 ENCOUNTER — Telehealth: Payer: Self-pay

## 2022-09-26 ENCOUNTER — Other Ambulatory Visit: Payer: Self-pay | Admitting: Family Medicine

## 2022-09-26 DIAGNOSIS — B9689 Other specified bacterial agents as the cause of diseases classified elsewhere: Secondary | ICD-10-CM

## 2022-09-26 MED ORDER — METRONIDAZOLE 500 MG PO TABS
500.0000 mg | ORAL_TABLET | Freq: Two times a day (BID) | ORAL | 0 refills | Status: AC
Start: 2022-09-26 — End: 2022-10-03

## 2022-09-26 NOTE — Telephone Encounter (Signed)
Referral placed this Is a note per scheduling for that office for the patient  Mother called back to reminder referral scheduler that pt needs 5 days to set up transportation with [facility].

## 2022-09-26 NOTE — Telephone Encounter (Signed)
Pts mom has came in the office , stating that She has some kind of infection. She said someone called her yesterday about going to obgyn but she doesn't think she needs to go there - I called the yesterday with her results - pt is still having issues - discharge now dark urine - anda rash all over mom refuses to go to a obgyn - due to her not being sexual active

## 2022-09-26 NOTE — Telephone Encounter (Signed)
Mom calling back again and is very worried about patient. Requesting to speak to Dr Nadine Counts today before we close because she doesn't want to worry the whole weekend about patient.

## 2022-09-26 NOTE — Telephone Encounter (Signed)
Spoke to mom.  Flagyl renewed. Amenable to referral to Dr Charlotta Newton if does not clear up. Cortisone to rash on toes BID prn.  She is aware of all recs

## 2022-09-29 ENCOUNTER — Encounter: Payer: Self-pay | Admitting: Family Medicine

## 2022-09-29 NOTE — Telephone Encounter (Signed)
If things are getting worse, please get her on the schedule to see DOD

## 2022-09-30 NOTE — Telephone Encounter (Signed)
I have placed a Note within the Referral letting Office know Patient will be using [facility] and will need 5 days to make them aware of Appt.

## 2022-10-01 ENCOUNTER — Encounter: Payer: Self-pay | Admitting: Family Medicine

## 2022-10-01 ENCOUNTER — Ambulatory Visit: Payer: Medicaid Other | Admitting: Family Medicine

## 2022-10-01 VITALS — BP 125/85 | HR 75 | Temp 98.9°F

## 2022-10-01 DIAGNOSIS — L989 Disorder of the skin and subcutaneous tissue, unspecified: Secondary | ICD-10-CM | POA: Diagnosis not present

## 2022-10-01 DIAGNOSIS — L89154 Pressure ulcer of sacral region, stage 4: Secondary | ICD-10-CM

## 2022-10-01 DIAGNOSIS — L03032 Cellulitis of left toe: Secondary | ICD-10-CM

## 2022-10-01 DIAGNOSIS — L700 Acne vulgaris: Secondary | ICD-10-CM | POA: Diagnosis not present

## 2022-10-01 DIAGNOSIS — Z9889 Other specified postprocedural states: Secondary | ICD-10-CM

## 2022-10-01 MED ORDER — FLUCONAZOLE 150 MG PO TABS
150.0000 mg | ORAL_TABLET | Freq: Once | ORAL | 0 refills | Status: AC
Start: 2022-10-01 — End: 2022-10-01

## 2022-10-01 MED ORDER — DOXYCYCLINE HYCLATE 100 MG PO TABS
100.0000 mg | ORAL_TABLET | Freq: Two times a day (BID) | ORAL | 0 refills | Status: AC
Start: 2022-10-01 — End: 2022-10-15

## 2022-10-01 NOTE — Progress Notes (Signed)
Acute Office Visit  Subjective:  [name], female    DOB: 12/06/1980, 42 y.o.   MRN: [id number]  Chief Complaint  Patient presents with   sores/blisters    Multiple sites Getting worse   HPI Patient is in today for facial lesion that are worsening. Patient presents with two caregivers that act as primary historians. States that they started a few weeks ago. Has tried clindamycin wipes. Reports that she has multiple skin lesions along her body. Reports sores that started on the top of her left foot. Her left great toe has now swollen with sore on the corner of her toenail. States that she has sore under her right knee, under her abdominal fold and under her breasts. In addition, they are concerned for white thick discharge from her stoma site and sacral wound. Reports that she has follow up with surgeon in September but the site continues to open and close intermittently. Denies sexual intercourse in over 10 years.   ROS As per HPI Objective:  BP 125/85   Pulse 75   Temp 98.9 F (37.2 C)   SpO2 94%   Physical Exam Constitutional:      General: She is awake. She is not in acute distress.    Appearance: Normal appearance. She is obese. She is not ill-appearing, toxic-appearing or diaphoretic.     Interventions: She is not intubated. Cardiovascular:     Rate and Rhythm: Normal rate.     Pulses:          Dorsalis pedis pulses are 2+ on the right side and 2+ on the left side.       Posterior tibial pulses are 2+ on the right side and 2+ on the left side.  Pulmonary:     Effort: Pulmonary effort is normal. No tachypnea, bradypnea, accessory muscle usage, prolonged expiration, respiratory distress or retractions. She is not intubated.  Genitourinary:    Comments: Foley in place  Musculoskeletal:     Right lower leg: No edema.     Left lower leg: No edema.     Comments: Wheelchair bound   Skin:    General: Skin is warm.     Capillary Refill: Capillary refill  takes less than 2 seconds.     Comments: Patch of pustules on right cheek with line of pustules along jawline  Left great toe with vesicular lesion and surround erythema  Erythematous lesion on dorsal side of left foot  Erythematous lesion on posterior side of right knee   Neurological:     General: No focal deficit present.     Mental Status: She is alert and easily aroused. Mental status is at baseline.  Psychiatric:        Attention and Perception: Attention normal.        Mood and Affect: Affect is flat.        Speech: Speech normal.        Behavior: Behavior is cooperative.       10/01/2022   11:21 AM 09/17/2022   11:39 AM 01/13/2022    3:54 PM  Depression screen PHQ 2/9  Decreased Interest 0 0 0  Down, Depressed, Hopeless 0 0 3  PHQ - 2 Score 0 0 3  Altered sleeping 2 0 2  Tired, decreased energy 2 0 3  Change in appetite 0 0 1  Feeling bad or failure about yourself  0 0 0  Trouble concentrating 0 0 0  Moving slowly or fidgety/restless  0 0 2  Suicidal thoughts 0 0 0  PHQ-9 Score 4 0 11  Difficult doing work/chores Not difficult at all Not difficult at all Somewhat difficult      10/01/2022   11:22 AM 09/17/2022   11:39 AM 01/13/2022    3:54 PM 11/26/2021    1:20 PM  GAD 7 : Generalized Anxiety Score  Nervous, Anxious, on Edge 2 0 2 1  Control/stop worrying 2 0 0 0  Worry too much - different things 0 0 0 0  Trouble relaxing 0 0 2 1  Restless 2 0 2 1  Easily annoyed or irritable 0 0 0 1  Afraid - awful might happen 0 0 0 0  Total GAD 7 Score 6 0 6 4  Anxiety Difficulty Not difficult at all Not difficult at all Somewhat difficult Not difficult at all   Assessment & Plan:  1. Acne vulgaris Medications as below. Will cover for yeast infection as patient is completing second round of antibiotics.  - doxycycline (VIBRA-TABS) 100 MG tablet; Take 1 tablet (100 mg total) by mouth 2 (two) times daily for 14 days.  Dispense: 28 tablet; Refill: 0 - fluconazole (DIFLUCAN)  150 MG tablet; Take 1 tablet (150 mg total) by mouth once for 1 dose. Take 1 dose. Take second dose 72 hours after the first if symptoms continue.  Dispense: 2 tablet; Refill: 0  2. Paronychia of great toe, left Medications as below. Will cover for yeast infection as patient is completing second round of antibiotics. Will cover for MRSA as patient has previously been infected with MRSA. Discussed with patient signs and symptoms to monitor for sepsis Discussed with patient following up with wound clinic for additional wounds.  - doxycycline (VIBRA-TABS) 100 MG tablet; Take 1 tablet (100 mg total) by mouth 2 (two) times daily for 14 days.  Dispense: 28 tablet; Refill: 0 - fluconazole (DIFLUCAN) 150 MG tablet; Take 1 tablet (150 mg total) by mouth once for 1 dose. Take 1 dose. Take second dose 72 hours after the first if symptoms continue.  Dispense: 2 tablet; Refill: 0 - Ambulatory referral to Wound Clinic  3. Skin lesions As above - doxycycline (VIBRA-TABS) 100 MG tablet; Take 1 tablet (100 mg total) by mouth 2 (two) times daily for 14 days.  Dispense: 28 tablet; Refill: 0 - fluconazole (DIFLUCAN) 150 MG tablet; Take 1 tablet (150 mg total) by mouth once for 1 dose. Take 1 dose. Take second dose 72 hours after the first if symptoms continue.  Dispense: 2 tablet; Refill: 0  4. Stage IV pressure ulcer of sacral region [facility]) Referral placed back to wound clinic.  - Ambulatory referral to Wound Clinic  5. History of vaginal surgery Patient is established with urology. Instructed patient to follow up with urology for complications from surgery. Patient has been referred to ob/gyn for additional follow up. Provided contact information.   The above assessment and management plan was discussed with the patient. The patient verbalized understanding of and has agreed to the management plan using shared-decision making. Patient is aware to call the clinic if they develop any new symptoms or if symptoms fail  to improve or worsen. Patient is aware when to return to the clinic for a follow-up visit. Patient educated on when it is appropriate to go to the emergency department.   Return if symptoms worsen or fail to improve.  Neale Burly, DNP-[name] [facility] [address] [place], [place] [address] [phone]

## 2022-10-01 NOTE — Patient Instructions (Signed)
Referral sent to: [facility] [address], [place] [address] [phone]

## 2022-10-03 ENCOUNTER — Other Ambulatory Visit: Payer: Self-pay | Admitting: Family Medicine

## 2022-10-03 DIAGNOSIS — K219 Gastro-esophageal reflux disease without esophagitis: Secondary | ICD-10-CM

## 2022-10-06 ENCOUNTER — Inpatient Hospital Stay (HOSPITAL_COMMUNITY)
Admission: EM | Admit: 2022-10-06 | Discharge: 2022-10-10 | DRG: 193 | Disposition: A | Discharge status: Home / Self Care | Payer: Medicaid Other | Attending: Internal Medicine | Admitting: Internal Medicine

## 2022-10-06 ENCOUNTER — Emergency Department (HOSPITAL_COMMUNITY): Payer: Medicaid Other

## 2022-10-06 ENCOUNTER — Other Ambulatory Visit: Payer: Self-pay

## 2022-10-06 DIAGNOSIS — Z823 Family history of stroke: Secondary | ICD-10-CM

## 2022-10-06 DIAGNOSIS — J9601 Acute respiratory failure with hypoxia: Secondary | ICD-10-CM | POA: Diagnosis present

## 2022-10-06 DIAGNOSIS — G8220 Paraplegia, unspecified: Secondary | ICD-10-CM | POA: Diagnosis present

## 2022-10-06 DIAGNOSIS — Z83438 Family history of other disorder of lipoprotein metabolism and other lipidemia: Secondary | ICD-10-CM

## 2022-10-06 DIAGNOSIS — L03031 Cellulitis of right toe: Secondary | ICD-10-CM | POA: Diagnosis present

## 2022-10-06 DIAGNOSIS — Z800 Family history of malignant neoplasm of digestive organs: Secondary | ICD-10-CM

## 2022-10-06 DIAGNOSIS — Z8349 Family history of other endocrine, nutritional and metabolic diseases: Secondary | ICD-10-CM

## 2022-10-06 DIAGNOSIS — J189 Pneumonia, unspecified organism: Secondary | ICD-10-CM | POA: Diagnosis present

## 2022-10-06 DIAGNOSIS — L89154 Pressure ulcer of sacral region, stage 4: Secondary | ICD-10-CM | POA: Diagnosis present

## 2022-10-06 DIAGNOSIS — E785 Hyperlipidemia, unspecified: Secondary | ICD-10-CM | POA: Diagnosis present

## 2022-10-06 DIAGNOSIS — J181 Lobar pneumonia, unspecified organism: Principal | ICD-10-CM | POA: Diagnosis present

## 2022-10-06 DIAGNOSIS — I10 Essential (primary) hypertension: Secondary | ICD-10-CM | POA: Diagnosis present

## 2022-10-06 DIAGNOSIS — Z90710 Acquired absence of both cervix and uterus: Secondary | ICD-10-CM

## 2022-10-06 DIAGNOSIS — N319 Neuromuscular dysfunction of bladder, unspecified: Secondary | ICD-10-CM | POA: Diagnosis present

## 2022-10-06 DIAGNOSIS — E039 Hypothyroidism, unspecified: Secondary | ICD-10-CM | POA: Diagnosis present

## 2022-10-06 DIAGNOSIS — D509 Iron deficiency anemia, unspecified: Secondary | ICD-10-CM | POA: Diagnosis present

## 2022-10-06 DIAGNOSIS — F32A Depression, unspecified: Secondary | ICD-10-CM | POA: Diagnosis present

## 2022-10-06 DIAGNOSIS — L0390 Cellulitis, unspecified: Secondary | ICD-10-CM | POA: Insufficient documentation

## 2022-10-06 DIAGNOSIS — L700 Acne vulgaris: Secondary | ICD-10-CM | POA: Diagnosis present

## 2022-10-06 DIAGNOSIS — Z8049 Family history of malignant neoplasm of other genital organs: Secondary | ICD-10-CM

## 2022-10-06 DIAGNOSIS — Z993 Dependence on wheelchair: Secondary | ICD-10-CM

## 2022-10-06 DIAGNOSIS — G40909 Epilepsy, unspecified, not intractable, without status epilepticus: Secondary | ICD-10-CM | POA: Diagnosis present

## 2022-10-06 DIAGNOSIS — N760 Acute vaginitis: Secondary | ICD-10-CM | POA: Diagnosis present

## 2022-10-06 DIAGNOSIS — R569 Unspecified convulsions: Secondary | ICD-10-CM

## 2022-10-06 DIAGNOSIS — F411 Generalized anxiety disorder: Secondary | ICD-10-CM | POA: Diagnosis present

## 2022-10-06 DIAGNOSIS — Z79899 Other long term (current) drug therapy: Secondary | ICD-10-CM

## 2022-10-06 DIAGNOSIS — Z8249 Family history of ischemic heart disease and other diseases of the circulatory system: Secondary | ICD-10-CM

## 2022-10-06 DIAGNOSIS — Z833 Family history of diabetes mellitus: Secondary | ICD-10-CM

## 2022-10-06 DIAGNOSIS — Z87891 Personal history of nicotine dependence: Secondary | ICD-10-CM

## 2022-10-06 DIAGNOSIS — K219 Gastro-esophageal reflux disease without esophagitis: Secondary | ICD-10-CM | POA: Diagnosis present

## 2022-10-06 DIAGNOSIS — F1120 Opioid dependence, uncomplicated: Secondary | ICD-10-CM | POA: Diagnosis present

## 2022-10-06 DIAGNOSIS — Z1152 Encounter for screening for COVID-19: Secondary | ICD-10-CM

## 2022-10-06 DIAGNOSIS — R0902 Hypoxemia: Principal | ICD-10-CM

## 2022-10-06 DIAGNOSIS — E876 Hypokalemia: Secondary | ICD-10-CM | POA: Diagnosis present

## 2022-10-06 DIAGNOSIS — Z79890 Hormone replacement therapy (postmenopausal): Secondary | ICD-10-CM

## 2022-10-06 DIAGNOSIS — G894 Chronic pain syndrome: Secondary | ICD-10-CM | POA: Diagnosis present

## 2022-10-06 HISTORY — DX: Acute respiratory failure with hypoxia: J96.01

## 2022-10-06 LAB — CBC WITH DIFFERENTIAL/PLATELET
Abs Immature Granulocytes: 0.02 10*3/uL (ref 0.00–0.07)
Basophils Absolute: 0 10*3/uL (ref 0.0–0.1)
Basophils Relative: 0 %
Eosinophils Absolute: 0.3 10*3/uL (ref 0.0–0.5)
Eosinophils Relative: 3 %
HCT: 38.4 % (ref 36.0–46.0)
Hemoglobin: 11.7 g/dL — ABNORMAL LOW (ref 12.0–15.0)
Immature Granulocytes: 0 %
Lymphocytes Relative: 28 %
Lymphs Abs: 3.3 10*3/uL (ref 0.7–4.0)
MCH: 27.1 pg (ref 26.0–34.0)
MCHC: 30.5 g/dL (ref 30.0–36.0)
MCV: 89.1 fL (ref 80.0–100.0)
Monocytes Absolute: 0.9 10*3/uL (ref 0.1–1.0)
Monocytes Relative: 8 %
Neutro Abs: 7.2 10*3/uL (ref 1.7–7.7)
Neutrophils Relative %: 61 %
Platelets: 424 10*3/uL — ABNORMAL HIGH (ref 150–400)
RBC: 4.31 MIL/uL (ref 3.87–5.11)
RDW: 15.7 % — ABNORMAL HIGH (ref 11.5–15.5)
WBC: 11.8 10*3/uL — ABNORMAL HIGH (ref 4.0–10.5)
nRBC: 0 % (ref 0.0–0.2)

## 2022-10-06 LAB — BASIC METABOLIC PANEL
Anion gap: 10 (ref 5–15)
BUN: 10 mg/dL (ref 6–20)
CO2: 24 mmol/L (ref 22–32)
Calcium: 8.4 mg/dL — ABNORMAL LOW (ref 8.9–10.3)
Chloride: 101 mmol/L (ref 98–111)
Creatinine, Ser: 0.7 mg/dL (ref 0.44–1.00)
GFR, Estimated: >60 mL/min (ref >60)
Glucose, Bld: 131 mg/dL — ABNORMAL HIGH (ref 70–99)
Potassium: 3.5 mmol/L (ref 3.5–5.1)
Sodium: 135 mmol/L (ref 135–145)

## 2022-10-06 LAB — TROPONIN I (HIGH SENSITIVITY)
Troponin I (High Sensitivity): <2 ng/L (ref <18)
Troponin I (High Sensitivity): <2 ng/L (ref <18)

## 2022-10-06 LAB — SARS CORONAVIRUS 2 BY RT PCR: SARS Coronavirus 2 by RT PCR: NEGATIVE

## 2022-10-06 LAB — BRAIN NATRIURETIC PEPTIDE: B Natriuretic Peptide: 42 pg/mL (ref 0.0–100.0)

## 2022-10-06 LAB — D-DIMER, QUANTITATIVE: D-Dimer, Quant: 0.63 ug/mL-FEU — ABNORMAL HIGH (ref 0.00–0.50)

## 2022-10-06 MED ORDER — OMEPRAZOLE 20 MG PO CPDR
20.0000 mg | DELAYED_RELEASE_CAPSULE | Freq: Every day | ORAL | 0 refills | Status: DC
Start: 2022-10-06 — End: 2022-11-13

## 2022-10-06 MED ORDER — OXYCODONE HCL 5 MG PO TABS
10.0000 mg | ORAL_TABLET | Freq: Once | ORAL | Status: AC
  Administered 2022-10-06: 10 mg via ORAL
  Filled 2022-10-06: qty 2

## 2022-10-06 MED ORDER — ALBUTEROL SULFATE HFA 108 (90 BASE) MCG/ACT IN AERS: 2.0000 | INHALATION_SPRAY | RESPIRATORY_TRACT | Status: DC | PRN

## 2022-10-06 MED ORDER — IOHEXOL 350 MG/ML SOLN
100.0000 mL | Freq: Once | INTRAVENOUS | Status: AC | PRN
  Administered 2022-10-06: 100 mL via INTRAVENOUS

## 2022-10-06 MED ORDER — SODIUM CHLORIDE 0.9 % IV SOLN
1.0000 g | INTRAVENOUS | Status: DC
  Administered 2022-10-06 – 2022-10-09 (×4): 1 g via INTRAVENOUS
  Filled 2022-10-06 (×4): qty 10

## 2022-10-06 MED ORDER — SODIUM CHLORIDE 0.9 % IV SOLN
500.0000 mg | INTRAVENOUS | Status: DC
  Administered 2022-10-07 – 2022-10-09 (×4): 500 mg via INTRAVENOUS
  Filled 2022-10-06 (×5): qty 5

## 2022-10-06 MED ORDER — IPRATROPIUM-ALBUTEROL 0.5-2.5 (3) MG/3ML IN SOLN
3.0000 mL | Freq: Once | RESPIRATORY_TRACT | Status: AC
  Administered 2022-10-06: 3 mL via RESPIRATORY_TRACT
  Filled 2022-10-06: qty 3

## 2022-10-06 NOTE — ED Notes (Signed)
Pt taken to CT.
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
## 2022-10-06 NOTE — Telephone Encounter (Signed)
Refill failed. resent °

## 2022-10-06 NOTE — ED Provider Notes (Signed)
[place] EMERGENCY DEPARTMENT AT [facility] Provider Note   CSN: [id number] Arrival date & time: 10/06/22  1736     History {Add pertinent medical, surgical, social history, OB history to HPI:1} Chief Complaint  Patient presents with   Shortness of Breath   HPI [name] is a 42 y.o. female with hypertension, hyperlipidemia, iron deficient anemia, paraplegic presenting for shortness of breath and cough.  Symptoms started acutely last night.  Denies chest pain.  States the cough is nonproductive.  Shortness of breath is worse with exertion.  Denies recent weight gain or lower extremity edema.  Denies fever.  Her mother also states that she has had some persistent white vaginal discharge.   Shortness of Breath      Home Medications Prior to Admission medications   Medication Sig Start Date End Date Taking? Authorizing Provider  acetaminophen (TYLENOL) 500 MG tablet Take 1,000 mg by mouth daily as needed for moderate pain or headache.    [provider]  busPIRone (BUSPAR) 15 MG tablet Take 1 tablet (15 mg total) by mouth 2 (two) times daily. 01/13/22   Raliegh Ip, DO  Cholecalciferol (VITAMIN D3) 50 MCG (2000 UT) TABS Take 1 tablet (50 mcg total) by mouth daily. 09/17/22   Raliegh Ip, DO  citalopram (CELEXA) 40 MG tablet TAKE 1 TABLET BY MOUTH EVERY DAY 04/07/22   Delynn Flavin M, DO  clindamycin (CLEOCIN T) 1 % SWAB Apply to affected acne areas twice daily 09/17/22   Delynn Flavin M, DO  [employer] VITAMIN B12 1000 MCG tablet TAKE 1 TABLET BY MOUTH EVERY DAY 08/11/22   Delynn Flavin M, DO  cyclobenzaprine (FLEXERIL) 5 MG tablet TAKE 1 TABLET BY MOUTH THREE TIMES A DAY 04/07/22   Gabriel Earing, [name]  diclofenac Sodium (VOLTAREN) 1 % GEL APPLY 2 GRAMS TO AFFECTED AREA 4 TIMES A DAY Patient taking differently: Apply 2 g topically 4 (four) times daily as needed (pain). 02/21/21   Gwenlyn Fudge, [name]  docusate sodium (COLACE) 100 MG  capsule Take 1 capsule (100 mg total) by mouth every 12 (twelve) hours as needed for mild constipation. Patient taking differently: Take 100 mg by mouth daily as needed for mild constipation. 01/13/22   Raliegh Ip, DO  doxycycline (VIBRA-TABS) 100 MG tablet Take 1 tablet (100 mg total) by mouth 2 (two) times daily for 14 days. 10/01/22 10/15/22  MilianAleen Campi, [name]  feeding supplement (ENSURE ENLIVE / ENSURE PLUS) LIQD Take 237 mLs by mouth 2 (two) times daily between meals. 03/16/22   Sherryll Burger, Pratik D, DO  fluticasone (FLONASE) 50 MCG/ACT nasal spray Place 2 sprays into both nostrils daily. 11/26/21   Gwenlyn Fudge, [name]  gabapentin (NEURONTIN) 600 MG tablet Take 1 tablet (600 mg total) by mouth in the morning, at noon, in the evening, and at bedtime. 01/13/22   Raliegh Ip, DO  hydrOXYzine (ATARAX) 25 MG tablet TAKE 1 TABLET BY MOUTH 3 TIMES DAILY AS NEEDED FOR ANXIETY. 04/03/22   Raliegh Ip, DO  Ipratropium-Albuterol (COMBIVENT RESPIMAT) 20-100 MCG/ACT AERS respimat Inhale 1 puff into the lungs every 6 (six) hours as needed. 11/26/21   Gwenlyn Fudge, [name]  lacosamide (VIMPAT) 200 MG TABS tablet Take 1 tablet (200 mg total) by mouth 2 (two) times daily. 06/21/22   Raliegh Ip, DO  levETIRAcetam (KEPPRA) 750 MG tablet Take 2 tablets (1,500 mg total) by mouth 2 (two) times daily. 06/13/22   Nadine Counts,  Ashly M, DO  levocetirizine (XYZAL) 5 MG tablet Take 1 tablet (5 mg total) by mouth every evening. 11/26/21   Gwenlyn Fudge, [name]  levothyroxine (SYNTHROID) 88 MCG tablet Take 1 tablet (88 mcg total) by mouth daily before breakfast. 09/18/22   Delynn Flavin M, DO  Lidocaine 4 % PTCH Place 1 patch onto the skin daily as needed (pain).    [provider]  meclizine (ANTIVERT) 12.5 MG tablet Take 1 tablet (12.5 mg total) by mouth 3 (three) times daily as needed for dizziness. 03/16/22   Sherryll Burger, Pratik D, DO  nystatin (NYAMYC) powder APPLY 1 APPLICATION TOPICALLY  3 (THREE) TIMES DAILY. 04/07/22   Gabriel Earing, [name]  omeprazole (PRILOSEC) 20 MG capsule Take 1 capsule (20 mg total) by mouth daily. 10/06/22   Raliegh Ip, DO  oxybutynin (DITROPAN-XL) 5 MG 24 hr tablet Take 1 tablet (5 mg total) by mouth daily. 01/13/22   Raliegh Ip, DO  Oxycodone HCl 10 MG TABS Take 1 tablet (10 mg total) by mouth in the morning, at noon, and at bedtime. 90 tabs for 30 days 10/02/22   Delynn Flavin M, DO  Oxycodone HCl 10 MG TABS Take 1 tablet (10 mg total) by mouth in the morning, at noon, and at bedtime. 90 tabs for 30 days 11/01/22   Delynn Flavin M, DO  Oxycodone HCl 10 MG TABS Take 1 tablet (10 mg total) by mouth in the morning, at noon, and at bedtime. 90 tabs for 30 days 12/01/22   Delynn Flavin M, DO  PAIN MANAGEMENT INTRATHECAL, IT, PUMP 1 each by Intrathecal route Continuous EPIDURAL. Intrathecal (IT) medication:  Baclofen    [provider]      Allergies    Macrobid [nitrofurantoin], Nickel, and Lisinopril    Review of Systems   Review of Systems  Respiratory:  Positive for shortness of breath.     Physical Exam Updated Vital Signs BP (!) 114/90   Pulse 95   Temp 98.3 F (36.8 C) (Oral)   Resp 18   Ht 5\' 8"  (1.727 m)   Wt 97.7 kg   SpO2 90%   BMI 32.75 kg/m  Physical Exam  ED Results / Procedures / Treatments   Labs (all labs ordered are listed, but only abnormal results are displayed) Labs Reviewed  SARS CORONAVIRUS 2 BY RT PCR  BASIC METABOLIC PANEL  BRAIN NATRIURETIC PEPTIDE  CBC WITH DIFFERENTIAL/PLATELET    EKG None  Radiology DG Chest Port 1 View  Result Date: 10/06/2022 CLINICAL DATA:  Shortness of breath and cough. EXAM: PORTABLE CHEST 1 VIEW COMPARISON:  03/12/2022 FINDINGS: Chronic elevation of right hemidiaphragm. The heart is normal in size. Normal mediastinal contours. No focal airspace disease, pleural effusion, or pneumothorax. Normal pulmonary vasculature. Lumbar fusion hardware partially  included. No acute osseous abnormalities are seen. IMPRESSION: No acute chest findings. Chronic elevation of right hemidiaphragm. Electronically Signed   By: Narda Rutherford M.D.   On: 10/06/2022 18:41    Procedures Procedures  {Document cardiac monitor, telemetry assessment procedure when appropriate:1}  Medications Ordered in ED Medications  albuterol (VENTOLIN HFA) 108 (90 Base) MCG/ACT inhaler 2 puff (has no administration in time range)  ipratropium-albuterol (DUONEB) 0.5-2.5 (3) MG/3ML nebulizer solution 3 mL (has no administration in time range)    ED Course/ Medical Decision Making/ A&P   {   Click here for ABCD2, HEART and other calculatorsREFRESH Note before signing :1}  Medical Decision Making Amount and/or Complexity of Data Reviewed Labs: ordered. Radiology: ordered.  Risk Prescription drug management.   ***  {Document critical care time when appropriate:1} {Document review of labs and clinical decision tools ie heart score, Chads2Vasc2 etc:1}  {Document your independent review of radiology images, and any outside records:1} {Document your discussion with family members, caretakers, and with consultants:1} {Document social determinants of health affecting pt's care:1} {Document your decision making why or why not admission, treatments were needed:1} Final Clinical Impression(s) / ED Diagnoses Final diagnoses:  None    Rx / DC Orders ED Discharge Orders     None

## 2022-10-06 NOTE — Addendum Note (Signed)
Addended by: Julious Payer D on: 10/06/2022 09:27 AM   Modules accepted: Orders

## 2022-10-06 NOTE — ED Triage Notes (Signed)
Pt arrived [facility] for c/ SOB and coughing. Pt believes she has MRSA all over her body.

## 2022-10-07 ENCOUNTER — Observation Stay (HOSPITAL_COMMUNITY): Payer: Medicaid Other

## 2022-10-07 DIAGNOSIS — R569 Unspecified convulsions: Secondary | ICD-10-CM

## 2022-10-07 DIAGNOSIS — E785 Hyperlipidemia, unspecified: Secondary | ICD-10-CM | POA: Diagnosis present

## 2022-10-07 DIAGNOSIS — Z993 Dependence on wheelchair: Secondary | ICD-10-CM | POA: Diagnosis not present

## 2022-10-07 DIAGNOSIS — L0390 Cellulitis, unspecified: Secondary | ICD-10-CM | POA: Insufficient documentation

## 2022-10-07 DIAGNOSIS — L03031 Cellulitis of right toe: Secondary | ICD-10-CM

## 2022-10-07 DIAGNOSIS — F411 Generalized anxiety disorder: Secondary | ICD-10-CM | POA: Diagnosis present

## 2022-10-07 DIAGNOSIS — J181 Lobar pneumonia, unspecified organism: Secondary | ICD-10-CM | POA: Diagnosis present

## 2022-10-07 DIAGNOSIS — F1120 Opioid dependence, uncomplicated: Secondary | ICD-10-CM | POA: Diagnosis present

## 2022-10-07 DIAGNOSIS — Z833 Family history of diabetes mellitus: Secondary | ICD-10-CM | POA: Diagnosis not present

## 2022-10-07 DIAGNOSIS — J189 Pneumonia, unspecified organism: Secondary | ICD-10-CM

## 2022-10-07 DIAGNOSIS — G894 Chronic pain syndrome: Secondary | ICD-10-CM | POA: Diagnosis present

## 2022-10-07 DIAGNOSIS — Z1152 Encounter for screening for COVID-19: Secondary | ICD-10-CM | POA: Diagnosis not present

## 2022-10-07 DIAGNOSIS — L89154 Pressure ulcer of sacral region, stage 4: Secondary | ICD-10-CM | POA: Diagnosis present

## 2022-10-07 DIAGNOSIS — I10 Essential (primary) hypertension: Secondary | ICD-10-CM | POA: Diagnosis present

## 2022-10-07 DIAGNOSIS — K219 Gastro-esophageal reflux disease without esophagitis: Secondary | ICD-10-CM | POA: Diagnosis present

## 2022-10-07 DIAGNOSIS — E039 Hypothyroidism, unspecified: Secondary | ICD-10-CM | POA: Diagnosis present

## 2022-10-07 DIAGNOSIS — J9601 Acute respiratory failure with hypoxia: Secondary | ICD-10-CM

## 2022-10-07 DIAGNOSIS — Z8349 Family history of other endocrine, nutritional and metabolic diseases: Secondary | ICD-10-CM | POA: Diagnosis not present

## 2022-10-07 DIAGNOSIS — E876 Hypokalemia: Secondary | ICD-10-CM | POA: Diagnosis present

## 2022-10-07 DIAGNOSIS — G40909 Epilepsy, unspecified, not intractable, without status epilepticus: Secondary | ICD-10-CM | POA: Diagnosis present

## 2022-10-07 DIAGNOSIS — Z823 Family history of stroke: Secondary | ICD-10-CM | POA: Diagnosis not present

## 2022-10-07 DIAGNOSIS — Z87891 Personal history of nicotine dependence: Secondary | ICD-10-CM | POA: Diagnosis not present

## 2022-10-07 DIAGNOSIS — G8220 Paraplegia, unspecified: Secondary | ICD-10-CM | POA: Diagnosis present

## 2022-10-07 DIAGNOSIS — Z8249 Family history of ischemic heart disease and other diseases of the circulatory system: Secondary | ICD-10-CM | POA: Diagnosis not present

## 2022-10-07 DIAGNOSIS — F32A Depression, unspecified: Secondary | ICD-10-CM | POA: Diagnosis present

## 2022-10-07 DIAGNOSIS — D509 Iron deficiency anemia, unspecified: Secondary | ICD-10-CM | POA: Diagnosis present

## 2022-10-07 DIAGNOSIS — Z83438 Family history of other disorder of lipoprotein metabolism and other lipidemia: Secondary | ICD-10-CM | POA: Diagnosis not present

## 2022-10-07 DIAGNOSIS — Z90710 Acquired absence of both cervix and uterus: Secondary | ICD-10-CM | POA: Diagnosis not present

## 2022-10-07 LAB — CBC WITH DIFFERENTIAL/PLATELET
Abs Immature Granulocytes: 0.03 10*3/uL (ref 0.00–0.07)
Basophils Absolute: 0.1 10*3/uL (ref 0.0–0.1)
Basophils Relative: 0 %
Eosinophils Absolute: 0.4 10*3/uL (ref 0.0–0.5)
Eosinophils Relative: 3 %
HCT: 37.9 % (ref 36.0–46.0)
Hemoglobin: 11.5 g/dL — ABNORMAL LOW (ref 12.0–15.0)
Immature Granulocytes: 0 %
Lymphocytes Relative: 35 %
Lymphs Abs: 4.2 10*3/uL — ABNORMAL HIGH (ref 0.7–4.0)
MCH: 27.1 pg (ref 26.0–34.0)
MCHC: 30.3 g/dL (ref 30.0–36.0)
MCV: 89.4 fL (ref 80.0–100.0)
Monocytes Absolute: 1 10*3/uL (ref 0.1–1.0)
Monocytes Relative: 8 %
Neutro Abs: 6.3 10*3/uL (ref 1.7–7.7)
Neutrophils Relative %: 54 %
Platelets: 412 10*3/uL — ABNORMAL HIGH (ref 150–400)
RBC: 4.24 MIL/uL (ref 3.87–5.11)
RDW: 15.6 % — ABNORMAL HIGH (ref 11.5–15.5)
WBC: 11.9 10*3/uL — ABNORMAL HIGH (ref 4.0–10.5)
nRBC: 0 % (ref 0.0–0.2)

## 2022-10-07 LAB — COMPREHENSIVE METABOLIC PANEL
ALT: 12 U/L (ref 0–44)
AST: 13 U/L — ABNORMAL LOW (ref 15–41)
Albumin: 3.1 g/dL — ABNORMAL LOW (ref 3.5–5.0)
Alkaline Phosphatase: 58 U/L (ref 38–126)
Anion gap: 9 (ref 5–15)
BUN: 10 mg/dL (ref 6–20)
CO2: 27 mmol/L (ref 22–32)
Calcium: 8.3 mg/dL — ABNORMAL LOW (ref 8.9–10.3)
Chloride: 102 mmol/L (ref 98–111)
Creatinine, Ser: 0.64 mg/dL (ref 0.44–1.00)
GFR, Estimated: >60 mL/min (ref >60)
Glucose, Bld: 156 mg/dL — ABNORMAL HIGH (ref 70–99)
Potassium: 3.3 mmol/L — ABNORMAL LOW (ref 3.5–5.1)
Sodium: 138 mmol/L (ref 135–145)
Total Bilirubin: 0.4 mg/dL (ref 0.3–1.2)
Total Protein: 6.6 g/dL (ref 6.5–8.1)

## 2022-10-07 LAB — HIV ANTIBODY (ROUTINE TESTING W REFLEX): HIV Screen 4th Generation wRfx: NONREACTIVE

## 2022-10-07 LAB — MAGNESIUM: Magnesium: 1.8 mg/dL (ref 1.7–2.4)

## 2022-10-07 MED ORDER — ONDANSETRON HCL 4 MG PO TABS: 4.0000 mg | ORAL_TABLET | Freq: Four times a day (QID) | ORAL | Status: DC | PRN

## 2022-10-07 MED ORDER — LEVOCETIRIZINE DIHYDROCHLORIDE 5 MG PO TABS: 5.0000 mg | ORAL_TABLET | Freq: Every evening | ORAL | Status: DC

## 2022-10-07 MED ORDER — LACOSAMIDE 50 MG PO TABS
200.0000 mg | ORAL_TABLET | Freq: Two times a day (BID) | ORAL | Status: DC
  Administered 2022-10-07 – 2022-10-10 (×8): 200 mg via ORAL
  Filled 2022-10-07 (×9): qty 4

## 2022-10-07 MED ORDER — CYCLOBENZAPRINE HCL 10 MG PO TABS
5.0000 mg | ORAL_TABLET | Freq: Three times a day (TID) | ORAL | Status: DC
  Administered 2022-10-07 – 2022-10-10 (×10): 5 mg via ORAL
  Filled 2022-10-07 (×10): qty 1

## 2022-10-07 MED ORDER — NYSTATIN 100000 UNIT/GM EX POWD
Freq: Two times a day (BID) | CUTANEOUS | Status: DC
  Filled 2022-10-07 (×2): qty 15

## 2022-10-07 MED ORDER — OXYCODONE HCL 5 MG PO TABS: 10.0000 mg | ORAL_TABLET | Freq: Three times a day (TID) | ORAL | Status: DC

## 2022-10-07 MED ORDER — HYDROXYZINE HCL 25 MG PO TABS
25.0000 mg | ORAL_TABLET | Freq: Three times a day (TID) | ORAL | Status: DC | PRN
  Administered 2022-10-07 (×2): 25 mg via ORAL
  Filled 2022-10-07 (×2): qty 1

## 2022-10-07 MED ORDER — OXYCODONE HCL 5 MG PO TABS
5.0000 mg | ORAL_TABLET | ORAL | Status: DC | PRN
  Filled 2022-10-07: qty 1

## 2022-10-07 MED ORDER — LORATADINE 10 MG PO TABS
10.0000 mg | ORAL_TABLET | Freq: Every day | ORAL | Status: DC
  Administered 2022-10-07 – 2022-10-09 (×3): 10 mg via ORAL
  Filled 2022-10-07 (×3): qty 1

## 2022-10-07 MED ORDER — PANTOPRAZOLE SODIUM 40 MG PO TBEC
40.0000 mg | DELAYED_RELEASE_TABLET | Freq: Every day | ORAL | Status: DC
  Administered 2022-10-07 – 2022-10-10 (×4): 40 mg via ORAL
  Filled 2022-10-07 (×4): qty 1

## 2022-10-07 MED ORDER — CITALOPRAM HYDROBROMIDE 20 MG PO TABS
40.0000 mg | ORAL_TABLET | Freq: Every day | ORAL | Status: DC
  Administered 2022-10-07 – 2022-10-10 (×4): 40 mg via ORAL
  Filled 2022-10-07 (×4): qty 2

## 2022-10-07 MED ORDER — MORPHINE SULFATE (PF) 2 MG/ML IV SOLN
2.0000 mg | INTRAVENOUS | Status: DC | PRN
  Administered 2022-10-07 – 2022-10-09 (×11): 2 mg via INTRAVENOUS
  Filled 2022-10-07 (×11): qty 1

## 2022-10-07 MED ORDER — ONDANSETRON HCL 4 MG/2ML IJ SOLN: 4.0000 mg | Freq: Four times a day (QID) | INTRAMUSCULAR | Status: DC | PRN

## 2022-10-07 MED ORDER — ACETAMINOPHEN 650 MG RE SUPP: 650.0000 mg | Freq: Four times a day (QID) | RECTAL | Status: DC | PRN

## 2022-10-07 MED ORDER — GUAIFENESIN ER 600 MG PO TB12
600.0000 mg | ORAL_TABLET | Freq: Two times a day (BID) | ORAL | Status: DC
  Administered 2022-10-07 – 2022-10-10 (×8): 600 mg via ORAL
  Filled 2022-10-07 (×8): qty 1

## 2022-10-07 MED ORDER — GABAPENTIN 300 MG PO CAPS
600.0000 mg | ORAL_CAPSULE | Freq: Two times a day (BID) | ORAL | Status: DC
  Administered 2022-10-07 – 2022-10-10 (×8): 600 mg via ORAL
  Filled 2022-10-07 (×8): qty 2

## 2022-10-07 MED ORDER — ENSURE ENLIVE PO LIQD
237.0000 mL | Freq: Two times a day (BID) | ORAL | Status: DC
  Administered 2022-10-07 – 2022-10-10 (×5): 237 mL via ORAL

## 2022-10-07 MED ORDER — OXYBUTYNIN CHLORIDE ER 5 MG PO TB24
5.0000 mg | ORAL_TABLET | Freq: Every day | ORAL | Status: DC
  Administered 2022-10-07 – 2022-10-10 (×3): 5 mg via ORAL
  Filled 2022-10-07 (×4): qty 1

## 2022-10-07 MED ORDER — MECLIZINE HCL 12.5 MG PO TABS: 12.5000 mg | ORAL_TABLET | Freq: Three times a day (TID) | ORAL | Status: DC | PRN

## 2022-10-07 MED ORDER — LEVETIRACETAM 500 MG PO TABS
1500.0000 mg | ORAL_TABLET | Freq: Two times a day (BID) | ORAL | Status: DC
  Filled 2022-10-07 (×2): qty 3

## 2022-10-07 MED ORDER — OXYCODONE HCL 5 MG PO TABS
10.0000 mg | ORAL_TABLET | Freq: Three times a day (TID) | ORAL | Status: DC
  Administered 2022-10-07 – 2022-10-10 (×9): 10 mg via ORAL
  Filled 2022-10-07 (×9): qty 2

## 2022-10-07 MED ORDER — LEVOTHYROXINE SODIUM 88 MCG PO TABS
88.0000 ug | ORAL_TABLET | Freq: Every day | ORAL | Status: DC
  Administered 2022-10-07 – 2022-10-10 (×4): 88 ug via ORAL
  Filled 2022-10-07 (×4): qty 1

## 2022-10-07 MED ORDER — HEPARIN SODIUM (PORCINE) 5000 UNIT/ML IJ SOLN
5000.0000 [IU] | Freq: Three times a day (TID) | INTRAMUSCULAR | Status: DC
  Administered 2022-10-07 – 2022-10-10 (×10): 5000 [IU] via SUBCUTANEOUS
  Filled 2022-10-07 (×10): qty 1

## 2022-10-07 MED ORDER — BUSPIRONE HCL 5 MG PO TABS
15.0000 mg | ORAL_TABLET | Freq: Two times a day (BID) | ORAL | Status: DC
  Administered 2022-10-07 – 2022-10-10 (×8): 15 mg via ORAL
  Filled 2022-10-07 (×8): qty 3

## 2022-10-07 MED ORDER — IPRATROPIUM-ALBUTEROL 0.5-2.5 (3) MG/3ML IN SOLN
3.0000 mL | RESPIRATORY_TRACT | Status: DC | PRN
  Administered 2022-10-07: 3 mL via RESPIRATORY_TRACT
  Filled 2022-10-07: qty 3

## 2022-10-07 MED ORDER — ACETAMINOPHEN 325 MG PO TABS: 650.0000 mg | ORAL_TABLET | Freq: Four times a day (QID) | ORAL | Status: DC | PRN

## 2022-10-07 MED ORDER — GUAIFENESIN-DM 100-10 MG/5ML PO SYRP
5.0000 mL | ORAL_SOLUTION | ORAL | Status: DC | PRN
  Administered 2022-10-07: 5 mL via ORAL
  Filled 2022-10-07: qty 5

## 2022-10-07 NOTE — Assessment & Plan Note (Signed)
Continue Synthroid °

## 2022-10-07 NOTE — Assessment & Plan Note (Signed)
-   CTA shows no PE but shows faint groundglass opacities throughout - COVID-negative - Borderline leukocytosis at 11.8 - Respiratory failure requiring 2 L nasal cannula - On Rocephin and Zithromax - Recently on 6 days of doxycycline - DuoNebs 4 dyspnea, wheezing, coughing spells - Mucinex - Sputum culture - Strep and Legionella urine antigens - Continue to monitor
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
## 2022-10-07 NOTE — Consult Note (Signed)
WOC Nurse Consult Note: Consult is performed remotely following review of the EMR. Patient is known to our department. Last seen by my associate, K. Mayo in January of this year. WOC Nurse ostomy consult note Stoma type/location: RLQ urostomy established in 2023 Stomal assessment/size: pink, moist, not measured today Peristomal assessment: not assessed today Treatment options for stomal/peristomal skin: skin barrier ring Output: clear, strong odor Ostomy pouching: 1pc.convex with skin barrier ring. Supplies: 1-piece convex urostomy pouch with skin barrier ring. Pouch is [employer] # [id number] and skin barrier ring is [employer] # [id number]. Adapter for attaching urostomy pouch to bedside urinary drainage bag is [employer] # [phone] Education provided: None Enrolled patient in [employer] DC program: No. Patient is established with a supply provider.   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 Seen by Dr. Lady Gary in the outpatient wound care center on 09/11/2022. Wound type:Pressure Pressure Injury POA: Yes Measurement:Per Dr. Lady Gary on 7/11/20204 (see also Nursing Flow Sheet), 1.5cm x 1.3cm x 1.7cm with undermining from 204 o'clock measuring 1.9cm Wound OZD:GUYQ, moist Drainage (amount, consistency, odor) small serous Periwound: intact with evidence of healing, contraction and scarring Dressing procedure/placement/frequency: I will continue the POC that Dr. Lady Gary has established using a daily cleanse and application of filler moistened with Vashe pure hypochlorous acid [employer] # [phone]). This is to be changed daily and covered with a dry gauze secured with a silicone foam positioned so that the "tip" is oriented pointing away form the anus. Heels are to be placed into [employer]. A mattress replacement with low air loss feature is provided. Turning and repositioning to minimize time in the supine position is recommended.  Patient is to  return to the outpatient wound clinic as previously discussed with Dr. Lady Gary following discharge. Consider also a referral to the outpatient ostomy clinic at [facility], if desired.  WOC nursing team will not follow, but will remain available to this patient, the nursing and medical teams.  Please re-consult if needed.  Thank you for inviting [place] to participate in this patient's Plan of Care.  Ladona Mow, MSN, RN, CNS, GNP, Leda Min, [employer], [employer] phone:  [phone]

## 2022-10-07 NOTE — Assessment & Plan Note (Signed)
-   Consult wound care

## 2022-10-07 NOTE — Assessment & Plan Note (Signed)
-   Oxygen sats in the high 80s - Requiring 2 L nasal cannula - Secondary to - CTA ruled out PE - Continue oxygen supplementation as needed, wean off as tolerated - DuoNeb as needed - Continue to monitor

## 2022-10-07 NOTE — Assessment & Plan Note (Addendum)
-   Erythema and edema of right great toe - X-ray to evaluate for changes consistent with osteomyelitis - Multiple pustules with purulent drainage - Covered with Rocephin - MRSA swab pending - If MRSA is positive, may consider escalating Rocephin to/or adding vancomycin

## 2022-10-07 NOTE — Assessment & Plan Note (Signed)
Continue PPI ?

## 2022-10-07 NOTE — Assessment & Plan Note (Signed)
-   Continue BuSpar, Celexa, Atarax

## 2022-10-07 NOTE — Progress Notes (Signed)
ASSUMPTION OF CARE NOTE   10/07/2022 11:28 AM  [name] was seen and examined.  The H&P by the admitting provider, orders, imaging was reviewed.  Please see orders.  Long discussion with daughter and reviewed WOC recommendations.  Will continue to follow.  Anticipate hospital stay of 1-2 more days.    Vitals:   10/07/22 0359 10/07/22 0931  BP:  111/89  Pulse:  82  Resp:    Temp:  98.4 F (36.9 C)  SpO2: 97% 99%    Results for orders placed or performed during the hospital encounter of 10/06/22  SARS Coronavirus 2 by RT PCR (hospital order, performed in [facility] hospital lab) *cepheid single result test* Anterior Nasal Swab   Specimen: Anterior Nasal Swab  Result Value Ref Range   SARS Coronavirus 2 by RT PCR NEGATIVE NEGATIVE  Basic metabolic panel  Result Value Ref Range   Sodium 135 135 - 145 mmol/L   Potassium 3.5 3.5 - 5.1 mmol/L   Chloride 101 98 - 111 mmol/L   CO2 24 22 - 32 mmol/L   Glucose, Bld 131 (H) 70 - 99 mg/dL   BUN 10 6 - 20 mg/dL   Creatinine, Ser 0.98 0.44 - 1.00 mg/dL   Calcium 8.4 (L) 8.9 - 10.3 mg/dL   GFR, Estimated >11 >91 mL/min   Anion gap 10 5 - 15  Brain natriuretic peptide  Result Value Ref Range   B Natriuretic Peptide 42.0 0.0 - 100.0 pg/mL  CBC with Differential  Result Value Ref Range   WBC 11.8 (H) 4.0 - 10.5 K/uL   RBC 4.31 3.87 - 5.11 MIL/uL   Hemoglobin 11.7 (L) 12.0 - 15.0 g/dL   HCT 47.8 29.5 - 62.1 %   MCV 89.1 80.0 - 100.0 fL   MCH 27.1 26.0 - 34.0 pg   MCHC 30.5 30.0 - 36.0 g/dL   RDW 30.8 (H) 65.7 - 84.6 %   Platelets 424 (H) 150 - 400 K/uL   nRBC 0.0 0.0 - 0.2 %   Neutrophils Relative % 61 %   Neutro Abs 7.2 1.7 - 7.7 K/uL   Lymphocytes Relative 28 %   Lymphs Abs 3.3 0.7 - 4.0 K/uL   Monocytes Relative 8 %   Monocytes Absolute 0.9 0.1 - 1.0 K/uL   Eosinophils Relative 3 %   Eosinophils Absolute 0.3 0.0 - 0.5 K/uL   Basophils Relative 0 %   Basophils Absolute 0.0 0.0 - 0.1 K/uL   Immature Granulocytes 0 %    Abs Immature Granulocytes 0.02 0.00 - 0.07 K/uL  D-dimer, quantitative  Result Value Ref Range   D-Dimer, Quant 0.63 (H) 0.00 - 0.50 ug/mL-FEU  HIV Antibody (routine testing w rflx)  Result Value Ref Range   HIV Screen 4th Generation wRfx Non Reactive Non Reactive  Comprehensive metabolic panel  Result Value Ref Range   Sodium 138 135 - 145 mmol/L   Potassium 3.3 (L) 3.5 - 5.1 mmol/L   Chloride 102 98 - 111 mmol/L   CO2 27 22 - 32 mmol/L   Glucose, Bld 156 (H) 70 - 99 mg/dL   BUN 10 6 - 20 mg/dL   Creatinine, Ser 9.62 0.44 - 1.00 mg/dL   Calcium 8.3 (L) 8.9 - 10.3 mg/dL   Total Protein 6.6 6.5 - 8.1 g/dL   Albumin 3.1 (L) 3.5 - 5.0 g/dL   AST 13 (L) 15 - 41 U/L   ALT 12 0 - 44 U/L   Alkaline Phosphatase  58 38 - 126 U/L   Total Bilirubin 0.4 0.3 - 1.2 mg/dL   GFR, Estimated >40 >98 mL/min   Anion gap 9 5 - 15  Magnesium  Result Value Ref Range   Magnesium 1.8 1.7 - 2.4 mg/dL  CBC with Differential/Platelet  Result Value Ref Range   WBC 11.9 (H) 4.0 - 10.5 K/uL   RBC 4.24 3.87 - 5.11 MIL/uL   Hemoglobin 11.5 (L) 12.0 - 15.0 g/dL   HCT 11.9 14.7 - 82.9 %   MCV 89.4 80.0 - 100.0 fL   MCH 27.1 26.0 - 34.0 pg   MCHC 30.3 30.0 - 36.0 g/dL   RDW 56.2 (H) 13.0 - 86.5 %   Platelets 412 (H) 150 - 400 K/uL   nRBC 0.0 0.0 - 0.2 %   Neutrophils Relative % 54 %   Neutro Abs 6.3 1.7 - 7.7 K/uL   Lymphocytes Relative 35 %   Lymphs Abs 4.2 (H) 0.7 - 4.0 K/uL   Monocytes Relative 8 %   Monocytes Absolute 1.0 0.1 - 1.0 K/uL   Eosinophils Relative 3 %   Eosinophils Absolute 0.4 0.0 - 0.5 K/uL   Basophils Relative 0 %   Basophils Absolute 0.1 0.0 - 0.1 K/uL   Immature Granulocytes 0 %   Abs Immature Granulocytes 0.03 0.00 - 0.07 K/uL  Troponin I (High Sensitivity)  Result Value Ref Range   Troponin I (High Sensitivity) <2 <18 ng/L  Troponin I (High Sensitivity)  Result Value Ref Range   Troponin I (High Sensitivity) <2 <18 ng/L   C. Laural Benes, MD [facility]   10/06/2022   5:47 PM How to contact the [facility] Attending or Consulting provider 7A - 7P or covering provider during after hours 7P -7A, for this patient?  Check the care team in [facility] and look for a) attending/consulting [facility] provider listed and b) the [facility] team listed Log into [email or web address] and use [place]'s universal password to access. If you do not have the password, please contact the hospital operator. Locate the [facility] provider you are looking for under [facility] and page to a number that you can be directly reached. If you still have difficulty reaching the provider, please page the [facility] (Director on Call) for the Hospitalists listed on amion for assistance.

## 2022-10-07 NOTE — Progress Notes (Signed)
   10/07/22 1218  TOC Brief Assessment  Insurance and Status Reviewed  Patient has primary care physician Yes  Home environment has been reviewed with parent  Prior level of function: wheelchair  Prior/Current Home Services Current home services  Social Determinants of Health Reivew SDOH reviewed no interventions necessary  Readmission risk has been reviewed Yes  Transition of care needs no transition of care needs at this time   Transition of Care Department [facility]) has reviewed patient and no TOC needs have been identified at this time. We will continue to monitor patient advancement through interdisciplinary progression rounds. If new patient transition needs arise, please place a TOC consult.

## 2022-10-07 NOTE — Assessment & Plan Note (Signed)
   Continue Vimpat and Keppra

## 2022-10-08 DIAGNOSIS — R569 Unspecified convulsions: Secondary | ICD-10-CM | POA: Diagnosis not present

## 2022-10-08 DIAGNOSIS — L89154 Pressure ulcer of sacral region, stage 4: Secondary | ICD-10-CM | POA: Diagnosis not present

## 2022-10-08 DIAGNOSIS — J9601 Acute respiratory failure with hypoxia: Secondary | ICD-10-CM | POA: Diagnosis not present

## 2022-10-08 LAB — BASIC METABOLIC PANEL
Anion gap: 7 (ref 5–15)
BUN: 11 mg/dL (ref 6–20)
CO2: 27 mmol/L (ref 22–32)
Calcium: 8.6 mg/dL — ABNORMAL LOW (ref 8.9–10.3)
Chloride: 101 mmol/L (ref 98–111)
Creatinine, Ser: 0.53 mg/dL (ref 0.44–1.00)
GFR, Estimated: >60 mL/min (ref >60)
Glucose, Bld: 105 mg/dL — ABNORMAL HIGH (ref 70–99)
Potassium: 3.8 mmol/L (ref 3.5–5.1)
Sodium: 135 mmol/L (ref 135–145)

## 2022-10-08 LAB — CBC
HCT: 37.5 % (ref 36.0–46.0)
Hemoglobin: 11.1 g/dL — ABNORMAL LOW (ref 12.0–15.0)
MCH: 26.7 pg (ref 26.0–34.0)
MCHC: 29.6 g/dL — ABNORMAL LOW (ref 30.0–36.0)
MCV: 90.4 fL (ref 80.0–100.0)
Platelets: 398 10*3/uL (ref 150–400)
RBC: 4.15 MIL/uL (ref 3.87–5.11)
RDW: 15.4 % (ref 11.5–15.5)
WBC: 8.8 10*3/uL (ref 4.0–10.5)
nRBC: 0 % (ref 0.0–0.2)

## 2022-10-08 LAB — MAGNESIUM: Magnesium: 1.9 mg/dL (ref 1.7–2.4)

## 2022-10-08 LAB — MRSA NEXT GEN BY PCR, NASAL: MRSA by PCR Next Gen: NOT DETECTED

## 2022-10-08 LAB — PROCALCITONIN: Procalcitonin: <0.1 ng/mL

## 2022-10-08 MED ORDER — POTASSIUM CHLORIDE CRYS ER 20 MEQ PO TBCR
20.0000 meq | EXTENDED_RELEASE_TABLET | Freq: Once | ORAL | Status: AC
  Administered 2022-10-08: 20 meq via ORAL
  Filled 2022-10-08: qty 1

## 2022-10-08 MED ORDER — MEDIHONEY WOUND/BURN DRESSING EX PSTE
1.0000 | PASTE | Freq: Every day | CUTANEOUS | Status: DC
  Administered 2022-10-09 – 2022-10-10 (×2): 1 via TOPICAL
  Filled 2022-10-08: qty 44

## 2022-10-08 MED ORDER — IPRATROPIUM-ALBUTEROL 0.5-2.5 (3) MG/3ML IN SOLN
3.0000 mL | Freq: Two times a day (BID) | RESPIRATORY_TRACT | Status: DC
  Administered 2022-10-09 – 2022-10-10 (×3): 3 mL via RESPIRATORY_TRACT
  Filled 2022-10-08 (×3): qty 3

## 2022-10-08 MED ORDER — HYDROCODONE BIT-HOMATROP MBR 5-1.5 MG/5ML PO SOLN: 5.0000 mL | ORAL | Status: DC | PRN

## 2022-10-08 MED ORDER — ARFORMOTEROL TARTRATE 15 MCG/2ML IN NEBU
15.0000 ug | INHALATION_SOLUTION | Freq: Two times a day (BID) | RESPIRATORY_TRACT | Status: DC
  Administered 2022-10-08 – 2022-10-10 (×4): 15 ug via RESPIRATORY_TRACT
  Filled 2022-10-08 (×4): qty 2

## 2022-10-08 MED ORDER — IPRATROPIUM-ALBUTEROL 0.5-2.5 (3) MG/3ML IN SOLN
3.0000 mL | Freq: Three times a day (TID) | RESPIRATORY_TRACT | Status: DC
  Administered 2022-10-08: 3 mL via RESPIRATORY_TRACT
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
## 2022-10-08 NOTE — Progress Notes (Signed)
   10/07/22 2156  Provider Notification  Provider Name/Title Carren Rang  Date Provider Notified 10/07/22  Time Provider Notified 2200  Method of Notification Page  Notification Reason Red med refusal (Pt stated she was weaned off and no longer takes medication.)   (Keppra)
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
## 2022-10-08 NOTE — Consult Note (Signed)
WOC Nurse Consult Note: Reason for Consult: Consulted for suggestions for care of bilateral foot wounds.Right great toe with full thickness lesion, left foot with two areas of erythema intertrigo at digits. Consult performed remotely after review of the Medical record including photographs Wound type: friction, moisture  ICD-10 CM Codes for Irritant Dermatitis  L24A9 - Due to friction or contact with other specified body fluids L30.4  - Erythema intertrigo. Also used for abrasion of the hand, chafing of the skin, dermatitis due to sweating and friction, friction dermatitis, friction eczema, and genital/thigh intertrigo.   Pressure Injury POA: N/A Measurement: To be measured by Bedside Rn and documented on nursing flow sheet  Wound bed:See photos provided by Provider. Drainage (amount, consistency, odor) scant serous Periwound:mild erythema, maceration Dressing procedure/placement/frequency:I have provided nursing with guidance for the daily care of these lesions using leptospermum Manuka honey (MediHoney) to the RGT and a silver hydrofiber to the interdigital moisture related areas. Feet are to be placed into offloading pressure redistribution heel boots after the provision of wound care.  WOC nursing team will not follow, but will remain available to this patient, the nursing and medical teams.  Please re-consult if needed.  Thank you for inviting [place] to participate in this patient's Plan of Care.  Ladona Mow, MSN, RN, CNS, GNP, Leda Min, [employer], [employer] phone:  [phone]

## 2022-10-09 ENCOUNTER — Encounter (HOSPITAL_COMMUNITY): Payer: Self-pay | Admitting: Family Medicine

## 2022-10-09 DIAGNOSIS — L89154 Pressure ulcer of sacral region, stage 4: Secondary | ICD-10-CM | POA: Diagnosis not present

## 2022-10-09 DIAGNOSIS — R569 Unspecified convulsions: Secondary | ICD-10-CM | POA: Diagnosis not present

## 2022-10-09 DIAGNOSIS — J9601 Acute respiratory failure with hypoxia: Secondary | ICD-10-CM | POA: Diagnosis not present

## 2022-10-09 DIAGNOSIS — J189 Pneumonia, unspecified organism: Secondary | ICD-10-CM | POA: Diagnosis not present

## 2022-10-09 LAB — WET PREP, GENITAL
Sperm: NONE SEEN
Trich, Wet Prep: NONE SEEN
WBC, Wet Prep HPF POC: >=10 — AB (ref <10)
Yeast Wet Prep HPF POC: NONE SEEN

## 2022-10-09 MED ORDER — METRONIDAZOLE 500 MG/100ML IV SOLN
500.0000 mg | Freq: Two times a day (BID) | INTRAVENOUS | Status: AC
  Administered 2022-10-09 – 2022-10-10 (×2): 500 mg via INTRAVENOUS
  Filled 2022-10-09 (×2): qty 100

## 2022-10-09 MED ORDER — METRONIDAZOLE 500 MG PO TABS: 500.0000 mg | ORAL_TABLET | Freq: Two times a day (BID) | ORAL | Status: DC

## 2022-10-09 NOTE — Progress Notes (Signed)
Patient's mom came to the desk stating patien'ts oxygen saturation was in the 80s. This nurse went in the room to assess patient. Patient was calm and relaxing in the bed. O2 saturation was 98-100% on RA.
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
## 2022-10-10 ENCOUNTER — Telehealth: Payer: Self-pay | Admitting: Family Medicine

## 2022-10-10 DIAGNOSIS — J9601 Acute respiratory failure with hypoxia: Secondary | ICD-10-CM | POA: Diagnosis not present

## 2022-10-10 DIAGNOSIS — L89154 Pressure ulcer of sacral region, stage 4: Secondary | ICD-10-CM | POA: Diagnosis not present

## 2022-10-10 DIAGNOSIS — R569 Unspecified convulsions: Secondary | ICD-10-CM | POA: Diagnosis not present

## 2022-10-10 MED ORDER — CEFDINIR 300 MG PO CAPS: 300.0000 mg | ORAL_CAPSULE | Freq: Two times a day (BID) | ORAL | 0 refills | Status: DC

## 2022-10-10 MED ORDER — AZITHROMYCIN 250 MG PO TABS: 500.0000 mg | ORAL_TABLET | Freq: Every day | ORAL | Status: DC

## 2022-10-10 MED ORDER — HYDROCODONE BIT-HOMATROP MBR 5-1.5 MG/5ML PO SOLN: 5.0000 mL | ORAL | 0 refills | Status: DC | PRN

## 2022-10-10 MED ORDER — MEDIHONEY WOUND/BURN DRESSING EX PSTE: 1.0000 | PASTE | Freq: Every day | CUTANEOUS | 0 refills | Status: DC

## 2022-10-10 MED ORDER — CEFDINIR 300 MG PO CAPS: 300.0000 mg | ORAL_CAPSULE | Freq: Two times a day (BID) | ORAL | Status: DC

## 2022-10-10 MED ORDER — METRONIDAZOLE 500 MG PO TABS: 500.0000 mg | ORAL_TABLET | Freq: Two times a day (BID) | ORAL | 0 refills | Status: DC

## 2022-10-10 MED ORDER — CLINDAMYCIN PHOSPHATE 1 % EX GEL: Freq: Two times a day (BID) | CUTANEOUS | 1 refills | Status: DC

## 2022-10-10 NOTE — Telephone Encounter (Signed)
Mom of patient wants to talk to nurse about meds that she is on from the hospital because she thinks it was go against her agreement here with PCP

## 2022-10-10 NOTE — Plan of Care (Signed)
?  Problem: Activity: ?Goal: Ability to tolerate increased activity will improve ?Outcome: Adequate for Discharge ?  ?Problem: Clinical Measurements: ?Goal: Ability to maintain a body temperature in the normal range will improve ?Outcome: Adequate for Discharge ?  ?Problem: Respiratory: ?Goal: Ability to maintain adequate ventilation will improve ?Outcome: Adequate for Discharge ?Goal: Ability to maintain a clear airway will improve ?Outcome: Adequate for Discharge ?  ?Problem: Education: ?Goal: Knowledge of General Education information will improve ?Description: Including pain rating scale, medication(s)/side effects and non-pharmacologic comfort measures ?Outcome: Adequate for Discharge ?  ?Problem: Health Behavior/Discharge Planning: ?Goal: Ability to manage health-related needs will improve ?Outcome: Adequate for Discharge ?  ?Problem: Clinical Measurements: ?Goal: Ability to maintain clinical measurements within normal limits will improve ?Outcome: Adequate for Discharge ?Goal: Will remain free from infection ?Outcome: Adequate for Discharge ?Goal: Diagnostic test results will improve ?Outcome: Adequate for Discharge ?Goal: Respiratory complications will improve ?Outcome: Adequate for Discharge ?Goal: Cardiovascular complication will be avoided ?Outcome: Adequate for Discharge ?  ?Problem: Activity: ?Goal: Risk for activity intolerance will decrease ?Outcome: Adequate for Discharge ?  ?Problem: Nutrition: ?Goal: Adequate nutrition will be maintained ?Outcome: Adequate for Discharge ?  ?Problem: Coping: ?Goal: Level of anxiety will decrease ?Outcome: Adequate for Discharge ?  ?Problem: Elimination: ?Goal: Will not experience complications related to bowel motility ?Outcome: Adequate for Discharge ?Goal: Will not experience complications related to urinary retention ?Outcome: Adequate for Discharge ?  ?Problem: Pain Managment: ?Goal: General experience of comfort will improve ?Outcome: Adequate for Discharge ?   ?Problem: Safety: ?Goal: Ability to remain free from injury will improve ?Outcome: Adequate for Discharge ?  ?Problem: Skin Integrity: ?Goal: Risk for impaired skin integrity will decrease ?Outcome: Adequate for Discharge ?  ?

## 2022-10-10 NOTE — Telephone Encounter (Signed)
Spoke with mom - made her aware of Dr Cephas Darby response
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
## 2022-10-13 ENCOUNTER — Telehealth: Payer: Self-pay

## 2022-10-13 NOTE — Transitions of Care (Post Inpatient/ED Visit) (Signed)
10/13/2022  Name: [name] MRN: [id number] DOB: 06/16/1980  Today's TOC FU Call Status: Today's TOC FU Call Status:: Successful TOC FU Call Completed TOC FU Call Complete Date: 10/13/22  Transition Care Management Follow-up Telephone Call Date of Discharge: 10/10/22 [facility] Type of Discharge: Inpatient Admission Primary Inpatient Discharge Diagnosis:: hypoxemia How have you been since you were released from the hospital?: Better Any questions or concerns?: No  Items Reviewed: Did you receive and understand the discharge instructions provided?: Yes Medications obtained,verified, and reconciled?: Yes (Medications Reviewed) Any new allergies since your discharge?: No Dietary orders reviewed?: Yes Do you have support at home?: Yes People in Home: parent(s)  Medications Reviewed Today: Medications Reviewed Today     Reviewed by Karena Addison, LPN (Licensed Practical Nurse) on 10/13/22 at 1542  Med List Status: <None>   Medication Order Taking? Sig Documenting Provider Last Dose Status Informant  acetaminophen (TYLENOL) 500 MG tablet [id number] No Take 1,000 mg by mouth daily as needed for moderate pain or headache. [provider] Past Week Active Mother  busPIRone (BUSPAR) 15 MG tablet [id number] No Take 1 tablet (15 mg total) by mouth 2 (two) times daily. Delynn Flavin M, DO 10/07/2022 Active Mother  cefdinir (OMNICEF) 300 MG capsule [id number]  Take 1 capsule (300 mg total) by mouth every 12 (twelve) hours. Catarina Hartshorn, MD  Active   Cholecalciferol (VITAMIN D3) 50 MCG (2000 UT) TABS [id number] No Take 1 tablet (50 mcg total) by mouth daily. Raliegh Ip, DO 10/06/2022 Active Mother  citalopram (CELEXA) 40 MG tablet [id number] No TAKE 1 TABLET BY MOUTH EVERY DAY Delynn Flavin M, DO 10/06/2022 Active Mother  clindamycin (CLINDAGEL) 1 % gel [id number]  Apply topically 2 (two) times daily. Catarina Hartshorn, MD  Active   CVS VITAMIN B12  1000 MCG tablet [id number] No TAKE 1 TABLET BY MOUTH EVERY DAY Raliegh Ip, DO 10/06/2022 Active Mother  cyclobenzaprine (FLEXERIL) 5 MG tablet [id number] No TAKE 1 TABLET BY MOUTH THREE TIMES A DAY Gabriel Earing, [name] 10/05/2022 Active Mother  diclofenac Sodium (VOLTAREN) 1 % GEL [id number] No APPLY 2 GRAMS TO AFFECTED AREA 4 TIMES A DAY  Patient taking differently: Apply 2 g topically 4 (four) times daily as needed (pain).   Gwenlyn Fudge, [name] 10/05/2022 Active Mother  docusate sodium (COLACE) 100 MG capsule [id number] No Take 1 capsule (100 mg total) by mouth every 12 (twelve) hours as needed for mild constipation.  Patient taking differently: Take 100 mg by mouth daily as needed for mild constipation.   Delynn Flavin M, DO 10/07/2022 Active Mother  doxycycline (VIBRA-TABS) 100 MG tablet [id number] No Take 1 tablet (100 mg total) by mouth 2 (two) times daily for 14 days. Arrie Senate, [place] 10/06/2022 Active Mother  feeding supplement (ENSURE ENLIVE / ENSURE PLUS) LIQD [id number] No Take 237 mLs by mouth 2 (two) times daily between meals. Sherryll Burger, Pratik D, DO 10/06/2022 Active Mother  fluticasone (FLONASE) 50 MCG/ACT nasal spray [id number] No Place 2 sprays into both nostrils daily. Gwenlyn Fudge, [name] Past Week Active Mother  gabapentin (NEURONTIN) 600 MG tablet [id number] No Take 1 tablet (600 mg total) by mouth in the morning, at noon, in the evening, and at bedtime. Delynn Flavin M, DO 10/06/2022 Active Mother  HYDROcodone bit-homatropine (HYCODAN) 5-1.5 MG/5ML syrup [id number]  Take 5 mLs by mouth every 4 (four) hours as needed for cough. Catarina Hartshorn, MD  Active   hydrOXYzine (ATARAX) 25 MG tablet  [id number] No TAKE 1 TABLET BY MOUTH 3 TIMES DAILY AS NEEDED FOR ANXIETY. Delynn Flavin M, DO 10/06/2022 Active Mother  Ipratropium-Albuterol (COMBIVENT RESPIMAT) 20-100 MCG/ACT AERS respimat [id number] No Inhale 1 puff into the lungs every 6 (six) hours as needed. Gwenlyn Fudge, [name]  10/06/2022 Active Mother  lacosamide (VIMPAT) 200 MG TABS tablet [id number] No Take 1 tablet (200 mg total) by mouth 2 (two) times daily. Delynn Flavin M, DO 10/06/2022 Active Mother  leptospermum manuka honey (MEDIHONEY) PSTE paste [id number]  Apply 1 Application topically daily. Catarina Hartshorn, MD  Active   levocetirizine (XYZAL) 5 MG tablet [id number] No Take 1 tablet (5 mg total) by mouth every evening.  Patient taking differently: Take 5 mg by mouth at bedtime as needed for allergies.   Gwenlyn Fudge, [name] unknown Active Mother  levothyroxine (SYNTHROID) 88 MCG tablet [id number] No Take 1 tablet (88 mcg total) by mouth daily before breakfast. Raliegh Ip, DO 10/06/2022 Active Mother  Lidocaine 4 % PTCH [id number] No Place 1 patch onto the skin daily as needed (pain). [provider] Past Week Active Mother  meclizine (ANTIVERT) 12.5 MG tablet [id number] No Take 1 tablet (12.5 mg total) by mouth 3 (three) times daily as needed for dizziness. Sherryll Burger, Pratik D, DO 10/06/2022 Active Mother  metroNIDAZOLE (FLAGYL) 500 MG tablet [id number]  Take 1 tablet (500 mg total) by mouth every 12 (twelve) hours. Catarina Hartshorn, MD  Active   nystatin [facility]) powder [id number] No APPLY 1 APPLICATION TOPICALLY 3 (THREE) TIMES DAILY. Gabriel Earing, [name] 10/06/2022 Active Mother  omeprazole (PRILOSEC) 20 MG capsule [id number] No Take 1 capsule (20 mg total) by mouth daily. Delynn Flavin M, DO 10/06/2022 Active Mother  Oxycodone HCl 10 MG TABS [id number] No Take 1 tablet (10 mg total) by mouth in the morning, at noon, and at bedtime. 90 tabs for 30 days Raliegh Ip, DO Taking Active Mother           Med Note Elesa Massed, Devona Konig Oct 07, 2022 10:14 AM) duplicate  PAIN MANAGEMENT INTRATHECAL, IT, PUMP [id number]  1 each by Intrathecal route Continuous EPIDURAL. Intrathecal (IT) medication:  Baclofen [provider]  Active Mother            Home Care and Equipment/Supplies: Were Home Health  Services Ordered?: NA Any new equipment or medical supplies ordered?: NA  Functional Questionnaire: Do you need assistance with bathing/showering or dressing?: No Do you need assistance with meal preparation?: No Do you need assistance with eating?: No Do you have difficulty maintaining continence: No Do you need assistance with getting out of bed/getting out of a chair/moving?: No Do you have difficulty managing or taking your medications?: No  Follow up appointments reviewed: PCP Follow-up appointment confirmed?: No (no avail appt, sent message to staff to schedule) MD Provider Line Number:[phone] Given: No Specialist Hospital Follow-up appointment confirmed?: Yes Date of Specialist follow-up appointment?: 10/16/22 Follow-Up Specialty Provider:: Wound care Do you need transportation to your follow-up appointment?: No Do you understand care options if your condition(s) worsen?: Yes-patient verbalized understanding    SIGNATURE Karena Addison, LPN [facility] Nurse Health Advisor Direct Dial [phone]

## 2022-10-14 ENCOUNTER — Other Ambulatory Visit (HOSPITAL_COMMUNITY): Payer: Self-pay

## 2022-10-15 ENCOUNTER — Telehealth: Payer: Self-pay | Admitting: Family Medicine

## 2022-10-15 NOTE — Telephone Encounter (Signed)
Appt made for 8/26 with Gottschalk.  Mom could not come on the 20th or sooner than the 26th due to other appt conflicts or Dr. Nadine Counts schedule was full.

## 2022-10-15 NOTE — Telephone Encounter (Signed)
Patient's mom calling to make a hospital follow up, she was in the hospital Monday the 5th - Friday the 9th but states that she is unable to bring her in this week due to having other appts. Requested a televisit, told her that we usually do this visit in office but let her know I would ask about doing a video. She stated that the patient may need an xray because she was in the hospital for pneumonia. Please call back and advise.

## 2022-10-16 ENCOUNTER — Other Ambulatory Visit (HOSPITAL_COMMUNITY): Payer: Self-pay

## 2022-10-16 ENCOUNTER — Encounter (HOSPITAL_BASED_OUTPATIENT_CLINIC_OR_DEPARTMENT_OTHER): Payer: Medicaid Other | Attending: General Surgery | Admitting: General Surgery

## 2022-10-16 DIAGNOSIS — L97512 Non-pressure chronic ulcer of other part of right foot with fat layer exposed: Secondary | ICD-10-CM | POA: Diagnosis not present

## 2022-10-16 DIAGNOSIS — G8222 Paraplegia, incomplete: Secondary | ICD-10-CM | POA: Diagnosis present

## 2022-10-16 DIAGNOSIS — D649 Anemia, unspecified: Secondary | ICD-10-CM | POA: Insufficient documentation

## 2022-10-16 DIAGNOSIS — L89154 Pressure ulcer of sacral region, stage 4: Secondary | ICD-10-CM | POA: Diagnosis not present

## 2022-10-16 NOTE — Progress Notes (Signed)
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 culture revealed ESBL E. coli and  diphtheroids and the patient was initially placed on ertapenem him in doxycycline for 3 weeks. Subsequently she comes in today for reevaluation and it does appear that her wound is actually doing significantly better even compared to last time we saw her. This is in regard to the medial sacral region. 2/5; I have not seen this wound in almost 3 months. Certainly has gotten better in terms of surface area although there is significant direct depth. She has completed antibiotics for the underlying osteomyelitis. 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 She has been using silver alginate. Her mother is changing the dressing. She claims to be offloading this area except for when she gets up to go to doctors appointments 3/12; this is [name] punched-out wound on the lower sacrum. Has [name] depth of about 1.8 cm. It does not appear to undermine. There is no palpable bone. We have been using silver alginate packing her mother is changing the dressing she claims to be offloading this. She had [name] baclofen pump placed to alleviate her spasticity 5/17; the patient has not been seen in over 2 months. 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 Apparently they started to notice an odor and drainage earlier this month. Patient was admitted to [facility] from 5/3 through 07/12/2019. We do XAYLEE, HIROSE [name] ([id number]) [id number] Page 5 of 18 not have any records here. Apparently she was found to have pneumonia, [name] UTI. She also went underwent [name] surgical debridement of her lower sacral wound. She comes in today with [name] really large postoperative wound. This does not have exposed bone but very close. This is right back to where this was [name] year or 2 ago. They have been using wet-to-dry. She is on an IV antibiotic but is not sure what drugs. We are not sure what home health company they  are currently using. We are trying to get records from [name]. 6/8; this the patient return to clinic 3 weeks ago. She had surgical debridement of the lower sacral wound. She apparently is completed her IV antibiotics although I am not exactly sure what IV antibiotics she had ordered. Her PICC  line is come out. Her wound is large but generally clean there still is exposed bone. Fortunately the area on the right heel is callused over I cannot prove there is an open area here per 6/22; they are using [name] wet to dry dressing when we left the clinic last time however they managed to find [name] box of silver alginate so they are using that with backing gauze. They are still changing this twice [name] day because of drainage issues. She has Medicaid and they will not be able to replace the want dressing she will have to go back to [name] wet-to-dry. In spite of this the wound actually looks quite good some improvement in dimension 7/13; using silver alginate. Slight improvement in overall wound volume including depth although there is still undermining. She tells me she is offloading this is much as possible 8/10-Patient back at 1 month, continuing to use silver alginate although she has run out and therefore using saline wet-to-dry, undermining is minimal, continuing attempts at offloading according to patient 9/21; its been about 9 weeks since I have seen this patient although I note she was seen once in August. Her wounds on the lower sacrum this looks [name] lot better than the last time I saw this. She is using silver alginate to the wound bed. They only have Medicaid therefore they have to need to pay out-of-pocket for the dressing supplies. This certainly limits options. 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 10/19; 1 month follow-up. Not much change in the wound in fact it is measuring slightly larger. Still using silver alginate which  her mother is purchasing off [employer] I believe. Since she was here she had [name] suprapubic catheter placed because of [name] lacerated urethra. 11/30; patient has not been here in about 6 weeks. She apparently has had 3 admissions to the hospital for pneumonia in the last 7 months 2 in the last 2 months. She came out of hospital this time with 4 L of oxygen. Her mother is using the Anasept wet-to-dry to the sacral wound and surprisingly this looks somewhat better. The patient states she is pending 24/7 in bed except for going to doctor's appointments this may be helping. She has an appointment with pulmonology on 12/17. She was [name] heavy smoker for [name] period of time I wonder whether she has COPD 12/28; patient is doing well she is off her oxygen which may have something to do with chronic Macrobid she was on [hypersensitivity pneumonitis]. She is also stop smoking. In any case she is doing well from [name] breathing point of view. She is using Anasept wet-to-dry. 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 She has been using Anasept gel wet-to-dry and really has done [name] nice job here. She does not have insurance for other wound care products but truthfully so far this is done [name] really good job. I note she is back on oxygen. 2/22; stage IV wound on her lower sacrum. They have been to using an Anasept gel wet-to-dry-based dressing. Ordinarily I would like to use [name] moistened collagen wet-to-dry but she is apparently not able to afford this. There was also some suggestion about using Dakin's but apparently her mother could not make 3/14; 3-week follow-up. She is going for bladder surgery at [facility] next week. Still using Anasept gel wet-to-dry. We will try to get  Prisma wet to dry through what I think is [employer]. This probably will not work 4/19; 4-week follow-up. She is using collagen with wet-to-dry dressings every day. She reports improvement  to the wound healing. Patient had bladder neck surgery with suprapubic catheter placement on 3/22. On 3/30 she was sent to the emergency department because of hypotension. She was found to be in septic shock in the setting of ESBL E. coli UTI. Currently she is doing well. 5/17; 4-week follow-up. She is using collagen with backing wet-to-dry. Really nice improvement in surface area of these wounds depth at 1.2 cm. She has had problems with urinary leakage. She does have [name] suprapubic catheter 6/14; 4-week follow-up. She was doing really well the last time we saw her in the wound had filled in quite [name] bit. However since that time her wound is gotten [name] lot deeper. Apparently her bladder surgery failed and she is incontinent [name] lot of the time. Furthermore her mother suffered [name] stroke and is staying with her sister. She now has care attendance but I am not really certain about the amount of care she has. We have been using silver collagen but apparently the family has to buy this privately. 6/28; the wound measures slightly larger I certainly do not see the difference. It has 1.5 cm of direct depth but not exposed to bone it does not appear to be infected. We had her using silver alginate although she does not seem to know what her mother's been dressing this with recently 7/12; 2-week follow-up. 1.7 mm of direct depth this is fluctuated [name] fair amount but I do not see any consistent trend towards closure. The tissue looks healthy minimal undermining no palpable bone. No evidence of surrounding infection. We have been using silver alginate wet-to-dry although the patient wants to go back to Anasept gel wet to dry 8/30; we have seen this patient in quite [name] while however her wound has come in nicely at roughly 0.8 or 0.9 mm of direct depth also down in length and width. She is using Anasept wet-to-dry her mother is changing the dressing 9/27; 1 month follow-up. Since she is being here last she apparently has  had an attempt to close her urethra by urology in [facility] this was temporarily successful but now is reopened.. We have been using Anasept wet-to-dry. Her variant of Medicaid will not pay for wound care supplies. Most of what the use is being paid for out of their own pocket 10/25; 1 month follow-up. Her wound is measuring better she is still using Anasept wet-to-dry. Her mother changes this twice [name] day because of drainage. Overall the wound dimensions are better. The patient states that her recent urologic procedure actually resulted in less incontinence which may be helping Apparently had her mattress overlay on her bed is disintegrating. She asked if I be willing to put in an order for replacement I told her we would try her best although I am not exactly sure how long Medicaid will allow for replacement 11/29; 1 month follow-up. She arrives with her mother who is her primary caregiver. There is still using Anasept wet-to-dry but her mother says because of drainage they are changing this 3 times [name] day. Dimensions are [name] little worse or as last time she was actually [name] little better 12/13; the patient's wound had deteriorated last time. I did [name] culture of the wound that showed of few rare Pseudomonas and [name] few rare Corynebacterium. The Corynebacterium is  likely [name] skin contaminant. I did prescribe topical gentamicin under the silver alginate directed at the Pseudomonas. Her mother says that there is [name] lot of drainage she changes the odor gauze packing 3 times [name] day currently using 6 gentamicin under silver alginate covering all of this with ABDs 03/13/2021; patient is using silver alginate. Very little change in this wound this actually goes down to bone with [name] rim of tissue separating environment from underlying sacral bone. There is no real granulation. At the base of this. She has not been systemically unwell. The wound itself not much changed however it abuts right on bone. She has Medicaid which  does not give [place] [name] lot of options for either home care or different dressings. We thought this over in some detail. We might be able to get [name] wound VAC through Medicaid but we would not be able to get this changed by home health. She lives in [facility] [place] which she would be [name] far drive to this clinic twice [name] week. We might be able to teach the daughter or friend how to do this but there is [name] lot of issues that need to be worked through before we put this into the final ordering status 03/28/2021; the wound actually looks somewhat better contracted. There is still some undermining but no evidence of infection. We have been using gentamicin and silver alginate. 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 She is apparently going for some form of tendon release next week by orthopedics. Her knees are fixed in extension right leg first apparently 2/16; the wound looks clean not much change in dimensions. She has undermining at roughly 4:00. There is no exposed bone. We are using silver alginate with underlying gentamicin. The last culture I did of this in December showed Pseudomonas which is the reason for the gentamicin topical Since patient was last here she had quadricep tendon release surgery at [facility] 05/09/2021: There has been improvement overall in the wound. The base is fairly clean with minimal slough. The size has contracted.[name] Unfortunately, one of her wounds from her quadricep tendon release is open and there is extensive nonviable tissue. She reports that she is going to have to have this operatively debrided. 06/06/2021: Since her last visit in clinic, she underwent debridement of the open wound from her quadricep tendon release with placement of Kerecis and primary closure. Her sutures have been removed and she saw her surgeon last week. In the interim, [name] clip from her suprapubic catheter caused [name] new wound to occur on her right thigh and her  old left heel pressure ulcer has reopened. Her sacral wound is smaller. None of the wounds appears infected, but there is epibole around the sacral wound and fairly thick slough on the heel wound. 06/20/2021: The heel ulcer is clean with good granulation tissue. The sacral wound is smaller but measures [name] little deeper today. There is heaped up senescent YATZIL, SAMUDIO [name] ([id number]) [phone].pdf Page 6 of 18 skin around the edges of the sacral wound. The wound on her thigh is [name] little bit bigger but remains fairly superficial. 07/04/2021: The heel ulcer is nearly completely healed. Very little open tissue. The sacral wound continues to contract around the cavity. There is [name] lot of heaped up senescent tissue around the edges of the wound. The wound on her thigh has not really made much improvement at all. It remains superficial but has [name] thick  layer of yellow slough. No concern for infection in any of the wound sites. 07/18/2021: The heel ulcer has closed. The sacral wound is shallower with [name] little bit of slough in the wound base. There is less senescent tissue around the perimeter. The wound on her thigh looks like it might be [name] little deeper in the center. It continues to have [name] fibrous slough layer. No concern for infection. 07/25/2021: We initiated wound VAC therapy last week. The sacral wound is smaller but still has some undermining. There is just [name] little bit of slough in the wound base. The wound on her thigh we began treating with Santyl. The fibrinous slough has diminished and there is some granulation tissue beginning to form. 08/01/2021: For some reason, the sacral wound measures deeper today. It is fairly clean and there is no significant odor from the wound, but the patient's mother reports an increase in the drainage. She has struggled with maintaining [name] good seal with the drape, given the location of the wound. On the other hand, the thigh wound is smaller and  shallower. There is just some fibrinous slough on the surface. 08/09/2021: The patient was hospitalized last week with [name] urinary tract infection. She is still feeling [name] bit poorly today. She is currently taking [name] course of oral antibiotics. The sacral wound is [name] little bit smaller today. It does have [name] layer of slough on the surface. The wound on her thigh continues to contract and is fairly superficial today. There is fibrinous slough at the site, as well. 08/23/2021: The patient was hospitalized again this last week with sepsis. It sounds like urology is planning to perform [name] cystectomy with ileal conduit to help with her urinary leakage issues. She apparently developed [name] cutaneous fungal infection where her wound VAC drape was and so that has been removed and her mom is doing wet-to-dry dressing changes. The periwound skin is now healing, but it is still fairly red and irritated. She has [name] deep tissue injury on the same heel that we had previously treated for an open ulcer, but there is currently no skin opening. The wound on her thigh is very superficial and clean without any slough accumulation. 08/30/2021: The heel looks good today and has not opened. The wound on her thigh is closed. The sacral wound looks better, particularly the periwound skin. Her mother has been applying [name] mixture of Desitin and miconazole to the periwound and just doing wet-to-dry dressings because we did not want to further irritate the skin with the wound VAC drape. The wound is clean with just [name] little bit of superficial slough and senescent skin heaped up around the margin. 09/05/2021: The periwound skin at her sacral ulcer site is markedly improved. Immediately surrounding the wound orifice, there is some thick, dried, and scaly skin. There is also some slough buildup on the wound surface. No odor or significant drainage. 7/14; the patient still has the same depth however surrounding granulation looks better. Using wound VAC  with topical [facility]stone antibiotics. She may be eligible for an improved mattress cover and we will look into it 10/11/2021: The wound is [name] little bit deeper today. She is struggling with urinary contamination of her sacral wound and has subsequently developed maceration and some tissue breakdown. No obvious external infection. There is good granulation tissue on the surface with just [name] little bit of light slough. 10/25/2021: The wound looks better today. The depth has come in by over [name] centimeter. Her skin is  in better condition. There is heaped up senescent skin around the wound perimeter. She received her level 3 bed and is very happy with it. 11/08/2021: The undermining has come in substantially. The overall wound depth is about the same. She continues to build up senescent skin around the wound perimeter. 11/22/2021: The wound apparently measured [name] little bit deeper, but it no longer undermines much at all. It is clean. There is senescent skin accumulation around the perimeter, as usual. 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 This was complicated by sepsis secondary to pneumonia. Her wound actually looks quite good. The size is about the same, but the depth has come in considerably and there is less undermining. It is [name] little bit dry around the edges. No concern for infection. 03/17/2022: The patient has been in and out of the hospital since her last visit, primarily with episodes of sepsis related to urinary infections. They have been having issues with her urostomy bags leaking having issues and the patient's mother is concerned that some of the contaminated urine may be reaching her sacral ulcer, which has enlarged since the last time we saw her. On inspection, the wound is [name] little larger, but it is very clean with just some slough and accumulation of senescent skin around the  margins. 04/16/2022: No real change to the sacral ulcer. It is clean without any significant slough or other debris accumulation. No concern for infection. 05/22/2022: The patient has been in the hospital and she was not on an appropriate surface for [name] time. She has also had some leakage from her urostomy due to the hospital not having the correct bags. Finally, her air-fluidized mattress has been leaking and was only just repaired. As result of these issues, her sacral ulcer is [name] little bit bigger, but it remains quite clean with good granulation tissue at the base. 06/19/2022: Her wound is smaller and shallower this week. There is [name] little bit of slough on the surface and some senescent skin around the edges. 07/17/2022: The wound has contracted further and is shallower again today. There is light slough on the wound surface and reaccumulation of the senescent skin around the edges. 08/14/2022: Although the wound measurements have not changed, the cavity has actually contracted quite [name] bit. There is some slough on the surface and reaccumulation of senescent skin around the wound margins. 09/11/2022: The external wound measurement is smaller but the depth is actually measuring [name] little bit deeper today. She has senescent skin that has reaccumulated around the orifice. 10/16/2022: She has [name] new wound on her right great toe. She apparently developed [name] pustular skin rash and had multiple draining sites; this area is the only remaining wound from that episode. The sacral ulcer is without any undermining, but did measure slightly deeper. She was in the hospital with pneumonia for about [name] week. Electronic Signature(s) Signed: 10/16/2022 3:02:03 PM By: Duanne Guess MD FACS Entered By: Duanne Guess on 10/16/2022 15:02:03 Jamie [name] ([id number]) [id number] Page 7 of 18 -------------------------------------------------------------------------------- Physical Exam  Details Patient Name: Date of Service: Jamie [name], Jamie [name] 10/16/2022 1:15 PM Medical Record Number: [id number] Patient Account Number: [id number] Date of Birth/Sex: Treating RN: Jul 30, 1980 (41 y.o. F) Primary Care Provider: Delynn Flavin Other Clinician: Referring Provider: Treating Provider/Extender: Tomasa Hosteller, Kathie Rhodes Weeks in Treatment: 328 Constitutional . . . . no acute distress. Respiratory Normal work of breathing on room air.  Notes 10/16/2022: She has [name] new wound on her right great toe. She apparently developed [name] pustular skin rash and had multiple draining sites; this area is the only remaining wound from that episode. The sacral ulcer is without any undermining, but did measure slightly deeper. Electronic Signature(s) Signed: 10/16/2022 3:02:46 PM By: Duanne Guess MD FACS Entered By: Duanne Guess on 10/16/2022 15:02:46 -------------------------------------------------------------------------------- Physician Orders Details Patient Name: Date of Service: Jamie [name], Jamie [name]. 10/16/2022 1:15 PM Medical Record Number: [id number] Patient Account Number: [id number] Date of Birth/Sex: Treating RN: 1981-01-23 (41 y.o. 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 - Dr. Lady Gary Room 1 ***HOYER*** Anesthetic Wound #1 Medial Sacrum (In clinic) Topical Lidocaine 4% applied to wound bed Wound #18 Right T Great oe (In clinic) Topical Lidocaine 4% applied to wound bed Bathing/ Shower/ Hygiene May shower and wash wound with  soap and water. Off-Loading Low air-loss mattress (Group 2) - pt may go back to group 2 surface for comfort if pt desires [name] fluidized (Group 3) mattress - medical modalities ir Turn and reposition every 2 hours Wound Treatment Lewey, Selen [name] ([id number]) [phone].pdf Page 8 of 18 Wound #1 - Sacrum Wound Laterality: Medial Cleanser: Soap and Water 1 x Per Day/30 Days Discharge Instructions: May shower and wash wound with dial antibacterial soap and water prior to dressing change. Cleanser: Anasept Antimicrobial Skin and Wound Cleanser, 8 (oz) 1 x Per Day/30 Days Discharge Instructions: Cleanse the wound with Anasept cleanser prior to applying [name] clean dressing using gauze sponges, not tissue or cotton balls. Peri-Wound Care: Zinc Oxide Ointment 30g tube 1 x Per Day/30 Days Discharge Instructions: Apply Zinc Oxide to periwound as needed for moisture Prim Dressing: Promogran Prisma Matrix, 4.34 (sq in) (silver collagen) 1 x Per Day/30 Days ary Discharge Instructions: Moisten collagen with saline or hydrogel and lay in bottom of wound bed. Prim Dressing: VASHE 1 x Per Day/30 Days ary Discharge Instructions: moisten gauze with Vashe and pack into wound Secondary Dressing: Zetuvit Plus Silicone Border Dressing 5x5 (in/in) 1 x Per Day/30 Days Discharge Instructions: Apply silicone border over primary dressing as directed. Wound #18 - T Great oe Wound Laterality: Right Prim Dressing: Promogran Prisma Matrix, 4.34 (sq in) (silver collagen) Every Other Day/30 Days ary Discharge Instructions: Moisten collagen with saline or hydrogel Secondary Dressing: Woven Gauze Sponges 2x2 in Every Other Day/30 Days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

## 2022-10-16 NOTE — Progress Notes (Signed)
Jamie, BETTINGER [name] ([id number]) [id number] Page 1 of 9 Visit Report for 10/16/2022 Arrival Information Details Patient Name: Date of Service: Jamie, [name] 10/16/2022 1:15 PM Medical Record Number: [id number] Patient Account Number: [id number] Date of Birth/Sex: Treating RN: [birth year] (42 y.o. [name] Primary Care : Delynn Flavin Other Clinician: Referring : Treating /Extender: [name] in Treatment: 328 Visit Information History Since Last Visit Added or deleted any medications: Yes Patient Arrived: Wheel Chair Any new allergies or adverse reactions: No Arrival Time: 13:57 Had [name] fall or experienced change in No Accompanied By: mother and caregiver activities of daily living that may affect Transfer Assistance: Michiel Sites Lift risk of falls: Patient Identification Verified: Yes Signs or symptoms of abuse/neglect since last visito No Secondary Verification Process Completed: Yes Hospitalized since last visit: Yes Patient Requires Transmission-Based Precautions: No Implantable device outside of the clinic excluding No Patient Has Alerts: No cellular tissue based products placed in the center since last visit: Has Dressing in Place as Prescribed: Yes Pain Present Now: Yes Electronic Signature(s) Signed: 10/16/2022 4:08:19 PM By: Zenaida Deed RN, BSN Entered By: Zenaida Deed on 10/16/2022 14:00:49 -------------------------------------------------------------------------------- Encounter Discharge Information Details Patient Name: Date of Service: Jamie Rink [name]. 10/16/2022 1:15 PM Medical Record Number: [id number] Patient Account Number: [id number] Date of Birth/Sex: Treating RN: Jan 05, 1981 (42 y.o. [name] Primary Care : Delynn Flavin Other Clinician: Referring : Treating /Extender: [name] in  Treatment: 328 Encounter Discharge Information Items Post Procedure Vitals Discharge Condition: Stable Temperature (F): 98 Ambulatory Status: Wheelchair Pulse (bpm): 97 Discharge Destination: Home Respiratory Rate (breaths/min): 20 Transportation: Other Blood Pressure (mmHg): 126/88 Accompanied By: mother and caregiver Schedule Follow-up Appointment: Yes Clinical Summary of Care: Patient Declined Notes transportation service Electronic Signature(s) Signed: 10/16/2022 4:08:19 PM By: Zenaida Deed RN, BSN Entered By: Zenaida Deed on 10/16/2022 15:56:18 [name] ([id number]) [id number] Page 2 of 9 -------------------------------------------------------------------------------- Lower Extremity Assessment Details Patient Name: Date of Service: [name], [name] 10/16/2022 1:15 PM Medical Record Number: [id number] Patient Account Number: [id number] Date of Birth/Sex: Treating RN: 1981-02-05 (42 y.o. [name] Primary Care : Delynn Flavin Other Clinician: Referring : Treating /Extender: [name] Weeks in Treatment: 328 Edema Assessment Assessed: [Left: No] [Right: No] Edema: [Left: Ye] [Right: s] Vascular Assessment Pulses: Dorsalis Pedis Palpable: [Right:No] Extremity colors, hair growth, and conditions: Extremity Color: [Right:Normal] Hair Growth on Extremity: [Right:Yes] Temperature of Extremity: [Right:Warm < 3 seconds] Electronic Signature(s) Signed: 10/16/2022 4:08:19 PM By: Zenaida Deed RN, BSN Entered By: Zenaida Deed on 10/16/2022 14:32:31 -------------------------------------------------------------------------------- Multi Wound Chart Details Patient Name: Date of Service: Jamie Rink [name]. 10/16/2022 1:15 PM Medical Record Number: [id number] Patient Account Number: [id number] Date of Birth/Sex: Treating RN: 1980/07/27 (42 y.o. F) Primary Care :  Delynn Flavin Other Clinician: Referring : Treating /Extender: [name] Weeks in Treatment: 328 Vital Signs Height(in): 65 Pulse(bpm): 97 Weight(lbs): 201 Blood Pressure(mmHg): 126/88 Body Mass Index(BMI): 33.4 Temperature(F): 98 Respiratory Rate(breaths/min): 20 [1:Photos:] [N/[name]:N/[name]] Medial Sacrum Right T Great oe N/[name] Wound Location: Pressure Injury Blister N/[name] Wounding Event: Pressure Ulcer Infection - not elsewhere classified N/[name] Primary Etiology: DYLYNN, MORONEY [name] ([id number]) [id number] Page 3 of 9 Anemia, Osteomyelitis Anemia, Osteomyelitis N/[name] Comorbid History: 05/15/2016 09/14/2022 N/[name] Date Acquired: 328 0 N/[name] Weeks of Treatment: Open Open N/[name] Wound Status: No No N/[name] Wound Recurrence: 2.3x1.4x2.5 0.5x0.6x0.1 N/[name] Measurements L x W x D (cm) 2.529 0.236 N/[name] [name] (cm) : rea  6.322 0.024 N/[name] Volume (cm) : 92.30% N/[name] N/[name] % Reduction in Area: 95.60% N/[name] N/[name] % 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 Full Thickness Without Exposed N/[name] Classification: Support Structures Medium Medium N/[name] Exudate [name] mount: Serosanguineous Serous N/[name] Exudate Type: red, brown amber N/[name] Exudate Color: Distinct, outline attached Distinct, outline attached N/[name] Wound Margin: Large (67-100%) None Present (0%) N/[name] Granulation [name] mount: Red N/[name] N/[name] Granulation Quality: Small (1-33%) Large (67-100%) N/[name] Necrotic [name] mount: Fat Layer (Subcutaneous Tissue): Yes Fat Layer (Subcutaneous Tissue): Yes N/[name] Exposed Structures: Fascia: No Fascia: No Tendon: No Tendon: No Muscle: No Muscle: No Joint: No Joint: No Bone: No Bone: No Small (1-33%) None N/[name] Epithelialization: Debridement - Selective/Open Wound Debridement - Excisional N/[name] Debridement: Pre-procedure Verification/Time Out 14:35 14:35 N/[name] Taken: Lidocaine 4% Topical Solution Lidocaine 4% Topical Solution N/[name] Pain Control: Slough Subcutaneous, Slough  N/[name] Tissue Debrided: Skin/Epidermis Skin/Subcutaneous Tissue N/[name] Level: 2.53 0.24 N/[name] Debridement [name] (sq cm): rea Curette Curette N/[name] Instrument: Minimum Minimum N/[name] Bleeding: Pressure Pressure N/[name] Hemostasis [name] chieved: 0 0 N/[name] Procedural Pain: 0 0 N/[name] Post Procedural Pain: Procedure was tolerated well Procedure was tolerated well N/[name] Debridement Treatment Response: 2.3x1.4x2.5 0.5x0.6x0.1 N/[name] Post Debridement Measurements L x W x D (cm) 6.322 0.024 N/[name] Post Debridement Volume: (cm) Category/Stage IV N/[name] N/[name] Post Debridement Stage: Rash: No No Abnormalities Noted N/[name] Periwound Skin Texture: Maceration: Yes No Abnormalities Noted N/[name] Periwound Skin Moisture: Dry/Scaly: No No Abnormalities Noted No Abnormalities Noted N/[name] Periwound Skin Color: No Abnormality No Abnormality N/[name] Temperature: Debridement Debridement N/[name] Procedures Performed: Treatment Notes Electronic Signature(s) Signed: 10/16/2022 2:58:56 PM By: Duanne Guess MD FACS Entered By: Duanne Guess on 10/16/2022 14:58:56 -------------------------------------------------------------------------------- Multi-Disciplinary Care Plan Details Patient Name: Date of Service: [name], [name]. 10/16/2022 1:15 PM Medical Record Number: [id number] Patient Account Number: [id number] Date of Birth/Sex: Treating RN: Mar 10, 1980 (41 y.o. [name] Primary Care : Delynn Flavin Other Clinician: Referring : Treating /Extender: [name] Weeks in Treatment: 328 Multidisciplinary Care Plan reviewed with physician Active Inactive Pressure DEMYRA, BANASIK [name] ([id number]) [id number] Page 4 of 9 Nursing Diagnoses: Knowledge deficit related to management of pressures ulcers Potential for impaired tissue integrity related to pressure, friction, moisture, and shear Goals: Patient will remain free from development of additional  pressure ulcers Date Initiated: 06/27/2016 Date Inactivated: 01/17/2019 Target Resolution Date: 12/24/2018 Goal Status: Met Patient/caregiver will verbalize risk factors for pressure ulcer development Date Initiated: 06/27/2016 Date Inactivated: 07/08/2017 Target Resolution Date: 07/08/2017 Goal Status: Met Patient/caregiver will verbalize understanding of pressure ulcer management Date Initiated: 06/27/2016 Target Resolution Date: 11/06/2022 Goal Status: Active Interventions: Assess: immobility, friction, shearing, incontinence upon admission and as needed Assess offloading mechanisms upon admission and as needed Assess potential for pressure ulcer upon admission and as needed Provide education on pressure ulcers Treatment Activities: Patient referred for pressure reduction/relief devices : 06/27/2016 Pressure reduction/relief device ordered : 06/27/2016 Notes: Wound/Skin Impairment Nursing Diagnoses: Impaired tissue integrity Knowledge deficit related to smoking impact on wound healing Knowledge deficit related to ulceration/compromised skin integrity Goals: Patient will demonstrate [name] reduced rate of smoking or cessation of smoking Date Initiated: 06/27/2016 Date Inactivated: 01/26/2017 Target Resolution Date: 01/08/2017 Goal Status: Unmet Unmet Reason: pt continues to smoke Patient/caregiver will verbalize understanding of skin care regimen Date Initiated: 06/27/2016 Target Resolution Date: 11/06/2022 Goal Status: Active Ulcer/skin breakdown will have [name] volume reduction of 50% by week 8 Date Initiated: 06/27/2016 Date Inactivated: 12/11/2016 Target Resolution Date: 07/25/2016 Goal Status: Met Interventions: Assess patient/caregiver ability  to obtain necessary supplies Assess patient/caregiver ability to perform ulcer/skin care regimen upon admission and as needed Assess ulceration(s) every visit Provide education on smoking Provide education on ulcer and skin care Treatment  Activities: Skin care regimen initiated : 06/27/2016 Topical wound management initiated : 06/27/2016 Notes: Electronic Signature(s) Signed: 10/16/2022 4:08:19 PM By: Zenaida Deed RN, BSN Entered By: Zenaida Deed on 10/16/2022 14:34:47 -------------------------------------------------------------------------------- Pain Assessment Details Patient Name: Date of Service: [name], [name]. 10/16/2022 1:15 PM Medical Record Number: [id number] Patient Account Number: [id number] Date of Birth/Sex: Treating RN: 28-Feb-1981 (41 y.o. [address] [place] [name] ([id number]) [id number] Page 5 of 9 Primary Care : Delynn Flavin Other Clinician: Referring : Treating /Extender: [name] in Treatment: 328 Active Problems Location of Pain Severity and Description of Pain Patient Has Paino Yes Site Locations Pain Location: Generalized Pain With Dressing Change: No Duration of the Pain. Constant / Intermittento Constant Rate the pain. Current Pain Level: 8 Worst Pain Level: 10 Character of Pain Describe the Pain: Aching Pain Management and Medication Current Pain Management: Medication: Yes Is the Current Pain Management Adequate: Adequate How does your wound impact your activities of daily livingo Sleep: Yes Bathing: No Appetite: No Relationship With Others: No Bladder Continence: No Emotions: Yes Bowel Continence: No Hobbies: Yes Toileting: No Dressing: No Notes reports right hip and back pain Electronic Signature(s) Signed: 10/16/2022 4:08:19 PM By: Zenaida Deed RN, BSN Entered By: Zenaida Deed on 10/16/2022 14:11:32 -------------------------------------------------------------------------------- Patient/Caregiver Education Details Patient Name: Date of Service: Ashley Jacobs 8/15/2024andnbsp1:15 PM Medical Record Number: [id number] Patient Account Number:  [id number] Date of Birth/Gender: Treating RN: 1980-10-26 (41 y.o. [name] Primary Care Physician: Delynn Flavin Other Clinician: Referring Physician: Treating Physician/Extender: [name] in Treatment: 328 Education Assessment Education Provided To: Patient Education Topics Provided NELI, RHEAD [name] ([id number]) [id number] Page 6 of 9 Infection: Methods: Explain/Verbal Responses: Reinforcements needed, State content correctly Pressure: Methods: Explain/Verbal Responses: Reinforcements needed, State content correctly Wound/Skin Impairment: Methods: Explain/Verbal Responses: Reinforcements needed, State content correctly Electronic Signature(s) Signed: 10/16/2022 4:08:19 PM By: Zenaida Deed RN, BSN Entered By: Zenaida Deed on 10/16/2022 14:35:17 -------------------------------------------------------------------------------- Wound Assessment Details Patient Name: Date of Service: [name], [name]. 10/16/2022 1:15 PM Medical Record Number: [id number] Patient Account Number: [id number] Date of Birth/Sex: Treating RN: 22-Sep-1980 (41 y.o. [name] Primary Care : Delynn Flavin Other Clinician: Referring : Treating /Extender: [name] Weeks in Treatment: 328 Wound Status Wound Number: 1 Primary Etiology: Pressure Ulcer Wound Location: Medial Sacrum Wound Status: Open Wounding Event: Pressure Injury Comorbid History: Anemia, Osteomyelitis Date Acquired: 05/15/2016 Weeks Of Treatment: 328 Clustered Wound: No Photos Wound Measurements Length: (cm) 2.3 Width: (cm) 1.4 Depth: (cm) 2.5 Area: (cm) 2.529 Volume: (cm) 6.322 % Reduction in Area: 92.3% % Reduction in Volume: 95.6% Epithelialization: Small (1-33%) Tunneling: No Undermining: No Wound Description Classification: Category/Stage IV Wound Margin: Distinct, outline  attached Exudate Amount: Medium Exudate Type: Serosanguineous Exudate Color: red, brown Foul Odor After Cleansing: No Slough/Fibrino No Wound Bed Granulation Amount: Large (67-100%) Exposed Structure Granulation Quality: Red Fascia Exposed: No Haberle, Nelda [name] ([id number]) [id number] Page 7 of 9 Necrotic Amount: Small (1-33%) Fat Layer (Subcutaneous Tissue) Exposed: Yes Necrotic Quality: Adherent Slough Tendon Exposed: No Muscle Exposed: No Joint Exposed: No Bone Exposed: No Periwound Skin Texture Texture Color No Abnormalities Noted: Yes No Abnormalities Noted: Yes Moisture Temperature / Pain No Abnormalities Noted: No Temperature: No Abnormality Dry / Scaly: No  Maceration: Yes Treatment Notes Wound #1 (Sacrum) Wound Laterality: Medial Cleanser Soap and Water Discharge Instruction: May shower and wash wound with dial antibacterial soap and water prior to dressing change. Anasept Antimicrobial Skin and Wound Cleanser, 8 (oz) Discharge Instruction: Cleanse the wound with Anasept cleanser prior to applying [name] clean dressing using gauze sponges, not tissue or cotton balls. Peri-Wound Care Zinc Oxide Ointment 30g tube Discharge Instruction: Apply Zinc Oxide to periwound as needed for moisture Topical Primary Dressing Promogran Prisma Matrix, 4.34 (sq in) (silver collagen) Discharge Instruction: Moisten collagen with saline or hydrogel and lay in bottom of wound bed. VASHE Discharge Instruction: moisten gauze with Vashe and pack into wound Secondary Dressing Zetuvit Plus Silicone Border Dressing 5x5 (in/in) Discharge Instruction: Apply silicone border over primary dressing as directed. Secured With Compression Wrap Compression Stockings Facilities manager) Signed: 10/16/2022 4:08:19 PM By: Zenaida Deed RN, BSN Entered By: Zenaida Deed on 10/16/2022  14:27:05 -------------------------------------------------------------------------------- Wound Assessment Details Patient Name: Date of Service: [name], [name]. 10/16/2022 1:15 PM Medical Record Number: [id number] Patient Account Number: [id number] Date of Birth/Sex: Treating RN: May 16, 1980 (41 y.o. [name] Primary Care : Delynn Flavin Other Clinician: Referring : Treating /Extender: [name] Weeks in Treatment: 328 Wound Status Wound Number: 18 Primary Etiology: Infection - not elsewhere classified Wound Location: Right T Great oe Wound Status: Open Wounding Event: Blister Comorbid History: Anemia, Osteomyelitis Date Acquired: 09/14/2022 [name], [name] ([id number]) [id number] Page 8 of 9 Weeks Of Treatment: 0 Clustered Wound: No Photos Wound Measurements Length: (cm) 0.5 Width: (cm) 0.6 Depth: (cm) 0.1 Area: (cm) 0.236 Volume: (cm) 0.024 % Reduction in Area: % Reduction in Volume: Epithelialization: None Tunneling: No Undermining: No Wound Description Classification: Full Thickness Without Exposed Suppor Wound Margin: Distinct, outline attached Exudate Amount: Medium Exudate Type: Serous Exudate Color: amber t Structures Foul Odor After Cleansing: No Slough/Fibrino Yes Wound Bed Granulation Amount: None Present (0%) Exposed Structure Necrotic Amount: Large (67-100%) Fascia Exposed: No Necrotic Quality: Adherent Slough Fat Layer (Subcutaneous Tissue) Exposed: Yes Tendon Exposed: No Muscle Exposed: No Joint Exposed: No Bone Exposed: No Periwound Skin Texture Texture Color No Abnormalities Noted: Yes No Abnormalities Noted: Yes Moisture Temperature / Pain No Abnormalities Noted: Yes Temperature: No Abnormality Treatment Notes Wound #18 (Toe Great) Wound Laterality: Right Cleanser Peri-Wound Care Topical Primary Dressing Promogran Prisma Matrix, 4.34 (sq  in) (silver collagen) Discharge Instruction: Moisten collagen with saline or hydrogel Secondary Dressing Woven Gauze Sponges 2x2 in Discharge Instruction: Apply over primary dressing as directed. Secured With Conforming Stretch Gauze Bandage, Sterile 2x75 (in/in) Discharge Instruction: Secure with stretch gauze as directed. Compression Wrap Compression Stockings Add-Ons Electronic Signature(s) HAYES, CARRUTHERS [name] ([id number]) [id number] Page 9 of 9 Signed: 10/16/2022 4:08:19 PM By: Zenaida Deed RN, BSN Entered By: Zenaida Deed on 10/16/2022 14:28:37 -------------------------------------------------------------------------------- Vitals Details Patient Name: Date of Service: Jamie Rink [name]. 10/16/2022 1:15 PM Medical Record Number: [id number] Patient Account Number: [id number] Date of Birth/Sex: Treating RN: April 13, 1980 (41 y.o. [name] Primary Care : Delynn Flavin Other Clinician: Referring : Treating /Extender: [name] Weeks in Treatment: 328 Vital Signs Time Taken: 14:00 Temperature (F): 98 Height (in): 65 Pulse (bpm): 97 Weight (lbs): 201 Respiratory Rate (breaths/min): 20 Body Mass Index (BMI): 33.4 Blood Pressure (mmHg): 126/88 Reference Range: 80 - 120 mg / dl Electronic Signature(s) Signed: 10/16/2022 4:08:19 PM By: Zenaida Deed RN, BSN Entered By: Zenaida Deed on 10/16/2022 14:01:12

## 2022-10-27 ENCOUNTER — Ambulatory Visit (INDEPENDENT_AMBULATORY_CARE_PROVIDER_SITE_OTHER): Payer: Medicaid Other | Admitting: Family Medicine

## 2022-10-27 ENCOUNTER — Ambulatory Visit (INDEPENDENT_AMBULATORY_CARE_PROVIDER_SITE_OTHER): Payer: Medicaid Other

## 2022-10-27 ENCOUNTER — Encounter: Payer: Self-pay | Admitting: Family Medicine

## 2022-10-27 VITALS — BP 148/98 | HR 86 | Ht 68.0 in

## 2022-10-27 DIAGNOSIS — G4734 Idiopathic sleep related nonobstructive alveolar hypoventilation: Secondary | ICD-10-CM

## 2022-10-27 DIAGNOSIS — G8929 Other chronic pain: Secondary | ICD-10-CM | POA: Diagnosis not present

## 2022-10-27 DIAGNOSIS — J181 Lobar pneumonia, unspecified organism: Secondary | ICD-10-CM

## 2022-10-27 DIAGNOSIS — Z09 Encounter for follow-up examination after completed treatment for conditions other than malignant neoplasm: Secondary | ICD-10-CM | POA: Diagnosis not present

## 2022-10-27 DIAGNOSIS — E876 Hypokalemia: Secondary | ICD-10-CM | POA: Diagnosis not present

## 2022-10-27 DIAGNOSIS — L700 Acne vulgaris: Secondary | ICD-10-CM

## 2022-10-27 MED ORDER — KETOROLAC TROMETHAMINE 30 MG/ML IJ SOLN
30.0000 mg | Freq: Once | INTRAMUSCULAR | Status: AC
Start: 2022-10-27 — End: 2022-10-27
  Administered 2022-10-27: 30 mg via INTRAMUSCULAR

## 2022-10-27 MED ORDER — DOXYCYCLINE HYCLATE 100 MG PO TABS
100.0000 mg | ORAL_TABLET | Freq: Every day | ORAL | 3 refills | Status: DC
Start: 2022-10-27 — End: 2023-09-23

## 2022-10-27 MED ORDER — CEFTRIAXONE SODIUM 250 MG IJ SOLR
1.0000 g | Freq: Once | INTRAMUSCULAR | Status: AC
Start: 2022-10-27 — End: 2022-10-27
  Administered 2022-10-27: 1 g via INTRAMUSCULAR

## 2022-10-27 NOTE — Patient Instructions (Signed)
 Sleep Apnea  Sleep apnea is a condition that affects your breathing while you are sleeping. Your tongue or soft tissue in your throat may block the flow of air while you sleep. You may have shallow breathing or stop breathing for short periods of time. People with sleep apnea may snore loudly. There are three kinds of sleep apnea: Obstructive sleep apnea. This kind is caused by a blocked or collapsed airway. This is the most common. Central sleep apnea. This kind happens when the part of the brain that controls breathing does not send the correct signals to the muscles that control breathing. Mixed sleep apnea. This is a combination of obstructive and central sleep apnea. What are the causes? The most common cause of sleep apnea is a collapsed or blocked airway. What increases the risk? Being very overweight. Having family members with sleep apnea. Having a tongue or tonsils that are larger than normal. Having a small airway or jaw problems. Being older. What are the signs or symptoms? Loud snoring. Restless sleep. Trouble staying asleep. Being sleepy or tired during the day. Waking up gasping or choking. Having a headache in the morning. Mood swings. Having a hard time remembering things and concentrating. How is this diagnosed? A medical history. A physical exam. A sleep study. This is also called a polysomnography test. This test is done at a sleep lab or in your home while you are sleeping. How is this treated? Treatment may include: Sleeping on your side. Losing weight if you're overweight. Wearing an oral appliance. This is a mouthpiece that moves your lower jaw forward. Using a positive airway pressure (PAP) device to keep your airways open while you sleep, such as: A continuous positive airway pressure (CPAP) device. This device gives forced air through a mask when you breathe out. This keeps your airways open. A bilevel positive airway pressure (BIPAP) device. This device  gives forced air through a mask when you breathe in and when you breathe out to keep your airways open. Having surgery if other treatments do not work. If your sleep apnea is not treated, you may be at risk for: Heart failure. Heart attack. Stroke. Type 2 diabetes or a problem with your blood sugar called insulin resistance. Follow these instructions at home: Medicines Take your medicines only as told by your health care provider. Avoid alcohol, medicines to help you relax, and certain pain medicines. These may make sleep apnea worse. General instructions Do not smoke, vape, or use products with nicotine or tobacco in them. If you need help quitting, talk with your provider. If you were given a PAP device to open your airway while you sleep, use it as told by your provider. If you're having surgery, make sure to tell your provider you have sleep apnea. You may need to bring your PAP device with you. Contact a health care provider if: The PAP device that you were given to use during sleep bothers you or does not seem to be working. You do not feel better or you feel worse. Get help right away if: You have trouble breathing. You have chest pain. You have trouble talking. One side of your body feels weak. A part of your face is hanging down. These symptoms may be an emergency. Call 911 right away. Do not wait to see if the symptoms will go away. Do not drive yourself to the hospital. This information is not intended to replace advice given to you by your health care provider. Make sure  you discuss any questions you have with your health care provider. Document Revised: 04/24/2022 Document Reviewed: 04/24/2022 Elsevier Patient Education  2024 [employer].
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
## 2022-10-28 LAB — BASIC METABOLIC PANEL
BUN/Creatinine Ratio: 16 (ref 9–23)
BUN: 11 mg/dL (ref 6–24)
CO2: 22 mmol/L (ref 20–29)
Calcium: 9.5 mg/dL (ref 8.7–10.2)
Chloride: 100 mmol/L (ref 96–106)
Creatinine, Ser: 0.68 mg/dL (ref 0.57–1.00)
Glucose: 96 mg/dL (ref 70–99)
Potassium: 4.1 mmol/L (ref 3.5–5.2)
Sodium: 139 mmol/L (ref 134–144)
eGFR: 113 mL/min/{1.73_m2} (ref >59)

## 2022-10-28 LAB — CBC
Hematocrit: 42.9 % (ref 34.0–46.6)
Hemoglobin: 14 g/dL (ref 11.1–15.9)
MCH: 27.8 pg (ref 26.6–33.0)
MCHC: 32.6 g/dL (ref 31.5–35.7)
MCV: 85 fL (ref 79–97)
Platelets: 593 10*3/uL — ABNORMAL HIGH (ref 150–450)
RBC: 5.04 x10E6/uL (ref 3.77–5.28)
RDW: 14.9 % (ref 11.7–15.4)
WBC: 9.4 10*3/uL (ref 3.4–10.8)

## 2022-10-29 ENCOUNTER — Other Ambulatory Visit: Payer: Self-pay

## 2022-10-29 ENCOUNTER — Telehealth: Payer: Self-pay | Admitting: Family Medicine

## 2022-10-29 DIAGNOSIS — B9689 Other specified bacterial agents as the cause of diseases classified elsewhere: Secondary | ICD-10-CM

## 2022-10-29 NOTE — Telephone Encounter (Signed)
Spoke with patients mother and advised that we would place a new referral and note that patient requires a hoyer lift. Mother verbalized understanding

## 2022-11-11 ENCOUNTER — Encounter (HOSPITAL_BASED_OUTPATIENT_CLINIC_OR_DEPARTMENT_OTHER): Payer: Self-pay | Admitting: Pulmonary Disease

## 2022-11-12 ENCOUNTER — Other Ambulatory Visit: Payer: Self-pay | Admitting: Family Medicine

## 2022-11-12 DIAGNOSIS — K219 Gastro-esophageal reflux disease without esophagitis: Secondary | ICD-10-CM

## 2022-11-13 ENCOUNTER — Encounter (HOSPITAL_BASED_OUTPATIENT_CLINIC_OR_DEPARTMENT_OTHER): Payer: Medicaid Other | Attending: General Surgery | Admitting: General Surgery

## 2022-11-13 DIAGNOSIS — L89624 Pressure ulcer of left heel, stage 4: Secondary | ICD-10-CM | POA: Diagnosis not present

## 2022-11-13 DIAGNOSIS — L97811 Non-pressure chronic ulcer of other part of right lower leg limited to breakdown of skin: Secondary | ICD-10-CM | POA: Diagnosis not present

## 2022-11-13 DIAGNOSIS — L97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: Secondary | ICD-10-CM | POA: Insufficient documentation

## 2022-11-13 DIAGNOSIS — D649 Anemia, unspecified: Secondary | ICD-10-CM | POA: Diagnosis not present

## 2022-11-13 DIAGNOSIS — L89623 Pressure ulcer of left heel, stage 3: Secondary | ICD-10-CM | POA: Insufficient documentation

## 2022-11-13 DIAGNOSIS — G8222 Paraplegia, incomplete: Secondary | ICD-10-CM | POA: Insufficient documentation

## 2022-11-13 DIAGNOSIS — L97312 Non-pressure chronic ulcer of right 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: Secondary | ICD-10-CM | POA: Insufficient documentation

## 2022-11-13 DIAGNOSIS — L89154 Pressure ulcer of sacral region, stage 4: Secondary | ICD-10-CM | POA: Diagnosis present

## 2022-11-13 DIAGNOSIS — L97512 Non-pressure chronic ulcer of other part of right foot with fat layer exposed: Secondary | ICD-10-CM | POA: Insufficient documentation

## 2022-11-13 DIAGNOSIS — L97218 Non-pressure chronic ulcer of right calf with other specified severity: Secondary | ICD-10-CM | POA: Insufficient documentation

## 2022-11-13 DIAGNOSIS — L89620 Pressure ulcer of left heel, unstageable: Secondary | ICD-10-CM | POA: Insufficient documentation

## 2022-11-13 DIAGNOSIS — L97521 Non-pressure chronic ulcer of 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: Secondary | ICD-10-CM | POA: Insufficient documentation

## 2022-11-13 DIAGNOSIS — L97322 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: Secondary | ICD-10-CM | POA: Diagnosis not present

## 2022-11-13 NOTE — Progress Notes (Signed)
[name], [name] ([id number]) [id number] Page 1 of 9 Visit Report for 11/13/2022 Arrival Information Details Patient Name: Date of Service: Jamie, [name] 11/13/2022 12:30 PM Medical Record Number: [id number] Patient Account Number: [id number] Date of Birth/Sex: Treating RN: 1980-12-30 (41 y.o. [name] Primary Care [name]: [name] Other Clinician: Referring [name]: Treating [name]/Extender: [name] in Treatment: 332 Visit Information History Since Last Visit Added or deleted any medications: No Patient Arrived: Wheel Chair Any new allergies or adverse reactions: No Arrival Time: 12:55 Had [name] fall or experienced change in No Accompanied By: mother, caregiver activities of daily living that may affect Transfer Assistance: Michiel Sites Lift risk of falls: Patient Identification Verified: Yes Signs or symptoms of abuse/neglect since last visito No Secondary Verification Process Completed: Yes Hospitalized since last visit: No Patient Requires Transmission-Based Precautions: No Implantable device outside of the clinic excluding No Patient Has Alerts: No cellular tissue based products placed in the center since last visit: Has Dressing in Place as Prescribed: Yes Pain Present Now: Yes Electronic Signature(s) Signed: 11/13/2022 4:16:21 PM By: Jamie Deed RN, BSN Entered By: [name] on 11/13/2022 09:56:15 -------------------------------------------------------------------------------- Encounter Discharge Information Details Patient Name: Date of Service: Jamie Rink [name]. 11/13/2022 12:30 PM Medical Record Number: [id number] Patient Account Number: [id number] Date of Birth/Sex: Treating RN: [birth year] (41 y.o. [name] Primary Care Lissandro Dilorenzo: [name] Other Clinician: Referring Firas Guardado: Treating [name]/Extender: [name] in  Treatment: [phone] Encounter Discharge Information Items Post Procedure Vitals Discharge Condition: Stable Temperature (F): 98.2 Ambulatory Status: Wheelchair Pulse (bpm): 94 Discharge Destination: Home Respiratory Rate (breaths/min): 18 Transportation: Other Blood Pressure (mmHg): 122/90 Accompanied By: mother, caregiver Schedule Follow-up Appointment: Yes Clinical Summary of Care: Patient Declined Notes transportation service Electronic Signature(s) Signed: 11/13/2022 4:16:21 PM By: Jamie Deed RN, BSN Entered By: [name] on 11/13/2022 13:11:37 [name] ([id number]) [id number] Page 2 of 9 -------------------------------------------------------------------------------- Lower Extremity Assessment Details Patient Name: Date of Service: [name], [name] 11/13/2022 12:30 PM Medical Record Number: [id number] Patient Account Number: [id number] Date of Birth/Sex: Treating RN: 1980-11-08 (41 y.o. [name] Primary Care Rykar Lebleu: [name] Other Clinician: Referring Naidelyn Parrella: Treating Cayley Pester/Extender: [name] in Treatment: 332 Electronic Signature(s) Signed: 11/13/2022 4:16:21 PM By: Jamie Deed RN, BSN Entered By: [name] on 11/13/2022 09:57:26 -------------------------------------------------------------------------------- Multi Wound Chart Details Patient Name: Date of Service: Jamie Rink [name]. 11/13/2022 12:30 PM Medical Record Number: [id number] Patient Account Number: [id number] Date of Birth/Sex: Treating RN: Jan 18, 1981 (41 y.o. [name], [name] Primary Care Tarynn Garling: [name] Other Clinician: Referring Nalu Troublefield: Treating Stefan Karen/Extender: [name] in Treatment: 332 Vital Signs Height(in): 65 Pulse(bpm): 94 Weight(lbs): 201 Blood Pressure(mmHg): 122/90 Body Mass Index(BMI): 33.4 Temperature(F): 98.2 Respiratory  Rate(breaths/min): 18 [1:Photos:] [N/[name]:N/[name]] Medial Sacrum Right T Great oe N/[name] Wound Location: Pressure Injury Blister N/[name] Wounding Event: Pressure Ulcer Infection - not elsewhere classified N/[name] Primary Etiology: Anemia, Osteomyelitis Anemia, Osteomyelitis N/[name] Comorbid History: 05/15/2016 09/14/2022 N/[name] Date Acquired: 332 4 N/[name] [name] of Treatment: Open Open N/[name] Wound Status: No No N/[name] Wound Recurrence: 2.4x1.5x2.2 0.3x0.3x0.1 N/[name] Measurements L x W x D (cm) 2.827 0.071 N/[name] [name] (cm) : rea 6.22 0.007 N/[name] Volume (cm) : 91.40% 69.90% N/[name] % Reduction in [name] rea: 95.70% 70.80% N/[name] % 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 Full Thickness Without Exposed N/[name] Classification: Support Structures Medium Small N/[name] Exudate Amount: Serosanguineous Serous N/[name] Exudate Type: red, brown amber N/[name] Exudate Color: Distinct, outline  attached Flat and Intact N/[name] Wound Margin: Large (67-100%) Medium (34-66%) N/[name] Granulation Amount: Red Red N/[name] Granulation Quality: Small (1-33%) Medium (34-66%) N/[name] Necrotic Amount: ADAIRE, CANCELLIERI [name] ([id number]) [id number] Page 3 of 9 Fat Layer (Subcutaneous Tissue): Yes Fat Layer (Subcutaneous Tissue): Yes N/[name] Exposed Structures: Fascia: No Fascia: No Tendon: No Tendon: No Muscle: No Muscle: No Joint: No Joint: No Bone: No Bone: No Small (1-33%) Small (1-33%) N/[name] Epithelialization: N/[name] Debridement - Selective/Open Wound N/[name] Debridement: Pre-procedure Verification/Time Out N/[name] 13:30 N/[name] Taken: N/[name] Necrotic/Eschar, Slough N/[name] Tissue Debrided: N/[name] Non-Viable Tissue N/[name] Level: N/[name] 0.13 N/[name] Debridement [name] (sq cm): rea N/[name] Curette N/[name] Instrument: N/[name] Minimum N/[name] Bleeding: N/[name] Pressure N/[name] Hemostasis [name] chieved: N/[name] Insensate N/[name] Procedural Pain: N/[name] Insensate N/[name] Post Procedural Pain: N/[name] Procedure was tolerated well N/[name] Debridement Treatment Response: N/[name] 0.4x0.4x0.1 N/[name] Post Debridement Measurements L x W x  D (cm) N/[name] 0.013 N/[name] Post Debridement Volume: (cm) Rash: No No Abnormalities Noted N/[name] Periwound Skin Texture: Maceration: Yes No Abnormalities Noted N/[name] Periwound Skin Moisture: Dry/Scaly: No No Abnormalities Noted No Abnormalities Noted N/[name] Periwound Skin Color: No Abnormality No Abnormality N/[name] Temperature: N/[name] Debridement N/[name] Procedures Performed: Treatment Notes Electronic Signature(s) Signed: 11/13/2022 1:56:36 PM By: Duanne Guess MD FACS Signed: 11/13/2022 4:16:21 PM By: Jamie Deed RN, BSN Entered By: Duanne Guess on 11/13/2022 10:56:36 -------------------------------------------------------------------------------- Multi-Disciplinary Care Plan Details Patient Name: Date of Service: [name], [name]. 11/13/2022 12:30 PM Medical Record Number: [id number] Patient Account Number: [id number] Date of Birth/Sex: Treating RN: 01/23/1981 (41 y.o. [name] Primary Care Louvina Cleary: [name] Other Clinician: Referring Ac Colan: Treating Morris Longenecker/Extender: [name] in Treatment: 332 Multidisciplinary Care Plan reviewed with physician Active Inactive Pressure Nursing Diagnoses: Knowledge deficit related to management of pressures ulcers Potential for impaired tissue integrity related to pressure, friction, moisture, and shear Goals: Patient will remain free from development of additional pressure ulcers Date Initiated: 06/27/2016 Date Inactivated: 01/17/2019 Target Resolution Date: 12/24/2018 Goal Status: Met Patient/caregiver will verbalize risk factors for pressure ulcer development Date Initiated: 06/27/2016 Date Inactivated: 07/08/2017 Target Resolution Date: 07/08/2017 Goal Status: Met Patient/caregiver will verbalize understanding of pressure ulcer management Date Initiated: 06/27/2016 Target Resolution Date: 12/04/2022 Goal Status: Active Interventions: Assess: immobility, friction, shearing, incontinence upon  admission and as needed [name], [name] ([id number]) [id number] Page 4 of 9 Assess offloading mechanisms upon admission and as needed Assess potential for pressure ulcer upon admission and as needed Provide education on pressure ulcers Treatment Activities: Patient referred for pressure reduction/relief devices : 06/27/2016 Pressure reduction/relief device ordered : 06/27/2016 Notes: Wound/Skin Impairment Nursing Diagnoses: Impaired tissue integrity Knowledge deficit related to smoking impact on wound healing Knowledge deficit related to ulceration/compromised skin integrity Goals: Patient will demonstrate [name] reduced rate of smoking or cessation of smoking Date Initiated: 06/27/2016 Date Inactivated: 01/26/2017 Target Resolution Date: 01/08/2017 Goal Status: Unmet Unmet Reason: pt continues to smoke Patient/caregiver will verbalize understanding of skin care regimen Date Initiated: 06/27/2016 Target Resolution Date: 12/04/2022 Goal Status: Active Ulcer/skin breakdown will have [name] volume reduction of 50% by week 8 Date Initiated: 06/27/2016 Date Inactivated: 12/11/2016 Target Resolution Date: 07/25/2016 Goal Status: Met Interventions: Assess patient/caregiver ability to obtain necessary supplies Assess patient/caregiver ability to perform ulcer/skin care regimen upon admission and as needed Assess ulceration(s) every visit Provide education on smoking Provide education on ulcer and skin care Treatment Activities: Skin care regimen initiated : 06/27/2016 Topical wound management initiated : 06/27/2016 Notes: Electronic Signature(s) Signed: 11/13/2022 4:16:21 PM By: Jamie Deed  RN, BSN Entered By: [name] on 11/13/2022 09:43:54 -------------------------------------------------------------------------------- Pain Assessment Details Patient Name: Date of Service: [name], [name] 11/13/2022 12:30 PM Medical Record Number: [id number] Patient  Account Number: [id number] Date of Birth/Sex: Treating RN: 26-Jul-1980 (41 y.o. [name] Primary Care Brain Honeycutt: [name] Other Clinician: Referring Cherylyn Sundby: Treating Maiah Sinning/Extender: [name] in Treatment: [phone] Active Problems Location of Pain Severity and Description of Pain Patient Has Paino Yes Site Locations Pain Location: KOLIE, RISSMAN [name] ([id number]) [id number] Page 5 of 9 Pain Location: Pain in Ulcers With Dressing Change: No Duration of the Pain. Constant / Intermittento Constant Rate the pain. Current Pain Level: 8 Worst Pain Level: 9 Least Pain Level: 3 Character of Pain Describe the Pain: Aching Pain Management and Medication Current Pain Management: Medication: Yes Is the Current Pain Management Adequate: Adequate Rest: Yes How does your wound impact your activities of daily livingo Sleep: No Bathing: No Appetite: No Relationship With Others: No Bladder Continence: No Emotions: Yes Bowel Continence: No Hobbies: No Toileting: No Dressing: No Electronic Signature(s) Signed: 11/13/2022 4:16:21 PM By: Jamie Deed RN, BSN Entered By: [name] on 11/13/2022 09:57:15 -------------------------------------------------------------------------------- Patient/Caregiver Education Details Patient Name: Date of Service: [name] 9/12/2024andnbsp12:30 PM Medical Record Number: [id number] Patient Account Number: [id number] Date of Birth/Gender: Treating RN: [birth year] (41 y.o. [name] Primary Care Physician: [name] Other Clinician: Referring Physician: Treating Physician/Extender: [name] in Treatment: [phone] Education Assessment Education Provided To: Patient Education Topics Provided Pressure: Methods: Explain/Verbal Responses: Reinforcements needed, State content correctly Wound/Skin Impairment: Methods:  Explain/Verbal Responses: Reinforcements needed, State content correctly Electronic Signature(s) Signed: 11/13/2022 4:16:21 PM By: Jamie Deed RN, BSN [place], Jaylnn [name] ([id number]) By: Jamie Deed RN, BSN [phone].pdf Page 6 of 9 Signed: 11/13/2022 4:16:21 PM Entered By: [name] on 11/13/2022 09:44:21 -------------------------------------------------------------------------------- Wound Assessment Details Patient Name: Date of Service: [name], [name] 11/13/2022 12:30 PM Medical Record Number: [id number] Patient Account Number: [id number] Date of Birth/Sex: Treating RN: 28-Oct-1980 (41 y.o. [name] Primary Care Lotoya Casella: [name] Other Clinician: Referring Ivalee Strauser: Treating Sayf Kerner/Extender: [name] in Treatment: 332 Wound Status Wound Number: 1 Primary Etiology: Pressure Ulcer Wound Location: Medial Sacrum Wound Status: Open Wounding Event: Pressure Injury Comorbid History: Anemia, Osteomyelitis Date Acquired: 05/15/2016 [name] Of Treatment: 332 Clustered Wound: No Photos Wound Measurements Length: (cm) 2.4 Width: (cm) 1.5 Depth: (cm) 2.2 Area: (cm) 2.827 Volume: (cm) 6.22 % Reduction in Area: 91.4% % Reduction in Volume: 95.7% Epithelialization: Small (1-33%) Tunneling: No Undermining: No Wound Description Classification: Category/Stage IV Wound Margin: Distinct, outline attached Exudate Amount: Medium Exudate Type: Serosanguineous Exudate Color: red, brown Foul Odor After Cleansing: No Slough/Fibrino No Wound Bed Granulation Amount: Large (67-100%) Exposed Structure Granulation Quality: Red Fascia Exposed: No Necrotic Amount: Small (1-33%) Fat Layer (Subcutaneous Tissue) Exposed: Yes Necrotic Quality: Adherent Slough Tendon Exposed: No Muscle Exposed: No Joint Exposed: No Bone Exposed: No Periwound Skin Texture Texture Color No Abnormalities Noted: Yes No  Abnormalities Noted: Yes Moisture Temperature / Pain No Abnormalities Noted: No Temperature: No Abnormality Dry / Scaly: No MacerationTamryn, Scull Lakeithia [name] ([id number]) [id number] Page 7 of 9 Treatment Notes Wound #1 (Sacrum) Wound Laterality: Medial Cleanser Soap and Water Discharge Instruction: May shower and wash wound with dial antibacterial soap and water prior to dressing change. Anasept Antimicrobial Skin and Wound Cleanser, 8 (oz) Discharge Instruction: Cleanse the wound with Anasept cleanser prior to applying [name] clean dressing using  gauze sponges, not tissue or cotton balls. Peri-Wound Care Zinc Oxide Ointment 30g tube Discharge Instruction: Apply Zinc Oxide to periwound as needed for moisture Topical Primary Dressing Promogran Prisma Matrix, 4.34 (sq in) (silver collagen) Discharge Instruction: Moisten collagen with saline or hydrogel and lay in bottom of wound bed. VASHE Discharge Instruction: moisten gauze with Vashe and pack into wound Secondary Dressing Zetuvit Plus Silicone Border Dressing 5x5 (in/in) Discharge Instruction: Apply silicone border over primary dressing as directed. Secured With Compression Wrap Compression Stockings Facilities manager) Signed: 11/13/2022 4:16:21 PM By: Jamie Deed RN, BSN Entered By: [name] on 11/13/2022 10:15:16 -------------------------------------------------------------------------------- Wound Assessment Details Patient Name: Date of Service: [name], [name]. 11/13/2022 12:30 PM Medical Record Number: [id number] Patient Account Number: [id number] Date of Birth/Sex: Treating RN: Nov 27, 1980 (41 y.o. [name] Primary Care Suhaila Troiano: [name] Other Clinician: Referring Phoenix Dresser: Treating Tyliah Schlereth/Extender: [name] in Treatment: 332 Wound Status Wound Number: 18 Primary Etiology: Infection - not elsewhere  classified Wound Location: Right T Great oe Wound Status: Open Wounding Event: Blister Comorbid History: Anemia, Osteomyelitis Date Acquired: 09/14/2022 [name] Of Treatment: 4 Clustered Wound: No Photos [name], [name] ([id number]) [phone].pdf Page 8 of 9 Wound Measurements Length: (cm) 0.3 Width: (cm) 0.3 Depth: (cm) 0.1 Area: (cm) 0.071 Volume: (cm) 0.007 % Reduction in Area: 69.9% % Reduction in Volume: 70.8% Epithelialization: Small (1-33%) Tunneling: No Undermining: No Wound Description Classification: Full Thickness Without Exposed Suppor Wound Margin: Flat and Intact Exudate Amount: Small Exudate Type: Serous Exudate Color: amber t Structures Foul Odor After Cleansing: No Slough/Fibrino Yes Wound Bed Granulation Amount: Medium (34-66%) Exposed Structure Granulation Quality: Red Fascia Exposed: No Necrotic Amount: Medium (34-66%) Fat Layer (Subcutaneous Tissue) Exposed: Yes Necrotic Quality: Adherent Slough Tendon Exposed: No Muscle Exposed: No Joint Exposed: No Bone Exposed: No Periwound Skin Texture Texture Color No Abnormalities Noted: Yes No Abnormalities Noted: Yes Moisture Temperature / Pain No Abnormalities Noted: Yes Temperature: No Abnormality Treatment Notes Wound #18 (Toe Great) Wound Laterality: Right Cleanser Peri-Wound Care Topical Primary Dressing Promogran Prisma Matrix, 4.34 (sq in) (silver collagen) Discharge Instruction: Moisten collagen with saline or hydrogel Secondary Dressing Woven Gauze Sponges 2x2 in Discharge Instruction: Apply over primary dressing as directed. Secured With Conforming Stretch Gauze Bandage, Sterile 2x75 (in/in) Discharge Instruction: Secure with stretch gauze as directed. Compression Wrap Compression Stockings Add-Ons Electronic Signature(s) Signed: 11/13/2022 4:16:21 PM By: Jamie Deed RN, BSN Entered By: [name] on 11/13/2022 10:16:01 [name]  ([id number]) [id number] Page 9 of 9 -------------------------------------------------------------------------------- Vitals Details Patient Name: Date of Service: [name], [name] 11/13/2022 12:30 PM Medical Record Number: [id number] Patient Account Number: [id number] Date of Birth/Sex: Treating RN: January 15, 1981 (41 y.o. [name] Primary Care Madoc Holquin: [name] Other Clinician: Referring Maijor Hornig: Treating Jamyia Fortune/Extender: [name] in Treatment: 332 Vital Signs Time Taken: 12:56 Temperature (F): 98.2 Height (in): 65 Pulse (bpm): 94 Weight (lbs): 201 Respiratory Rate (breaths/min): 18 Body Mass Index (BMI): 33.4 Blood Pressure (mmHg): 122/90 Reference Range: 80 - 120 mg / dl Electronic Signature(s) Signed: 11/13/2022 4:16:21 PM By: Jamie Deed RN, BSN Entered By: [name] on 11/13/2022 09:56:40

## 2022-11-13 NOTE — Progress Notes (Signed)
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 antibiotics she had ordered. Her PICC line is come out. Her wound is large but generally clean there still is exposed bone. Fortunately the area on the right heel is callused over I cannot prove there is an open area here per 6/22; they are using [name] wet to dry dressing when we left the clinic last time however they managed to find [name] box of silver alginate so they are using that with backing gauze. They are still changing this twice [name] day because of drainage issues. She has Medicaid and they will not be able to replace the want dressing she will have to go back to [name] wet-to-dry. In spite of this the wound actually looks quite good some improvement in dimension 7/13; using silver alginate. Slight improvement in overall wound volume including depth although there is still undermining. She tells me she is offloading this is much as possible 8/10-Patient back at 1 month, continuing to use silver alginate although she has run out and therefore using saline wet-to-dry, undermining is minimal, continuing attempts at offloading according to patient 9/21; its been about 9 weeks since I have seen this patient although I note she was seen once in August. Her wounds on the lower sacrum this looks [name] lot better than the last time I saw this. She is using silver alginate to the wound bed. They only  have Medicaid therefore they have to need to pay out-of-pocket for the dressing supplies. This certainly limits options. 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 10/19; 1 month follow-up. Not much change in the wound in fact it is measuring slightly larger. Still using silver alginate which her mother is purchasing off [employer] I believe. Since she was here she had [name] suprapubic catheter placed because of [name] lacerated urethra. 11/30; patient has not been here in about 6 weeks. She apparently has had 3 admissions to the hospital for pneumonia in the last 7 months 2 in the last 2 months. She came out of hospital this time with 4 L of oxygen. Her mother is using the Anasept wet-to-dry to the sacral wound and surprisingly this looks somewhat better. The patient states she is pending 24/7 in bed except for going to doctor's appointments this may be helping. She has an appointment with pulmonology on 12/17. She was [name] heavy smoker for [name] period of time I wonder whether she has COPD 12/28; patient is doing well she is off her oxygen which may have something to do with chronic Macrobid she was on [hypersensitivity pneumonitis]. She is also stop smoking. In any case she is doing well from [name] breathing point of view. She is using Anasept wet-to-dry. 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 She has been using Anasept gel wet-to-dry and really has done [name] nice job here. She does not have insurance for other wound care products but truthfully so far this is done [name] really good job. I note she is back on oxygen. 2/22; stage IV wound on her lower sacrum. They have been to using an Anasept gel wet-to-dry-based dressing. Ordinarily I would like to use [name] moistened collagen wet-to-dry but she is apparently not able to afford this. There was also some suggestion about using Dakin's but apparently her mother could not make 3/14; 3-week follow-up. She is going for bladder surgery at [facility] next week. Still using Anasept gel wet-to-dry. We will try to get Prisma wet to dry through what I  think is [facility]. This probably will not work 4/19; 4-week follow-up. She is using collagen with wet-to-dry dressings every day. She reports improvement to the wound healing. Patient had bladder neck surgery with suprapubic catheter placement on 3/22. On 3/30 she was sent to the emergency department because of hypotension. She was found to be in septic shock in the setting of ESBL E. coli UTI. Currently she is doing well. 5/17; 4-week follow-up. She is using collagen with backing wet-to-dry. Really nice improvement in surface area of these wounds depth at 1.2 cm. She has had problems with urinary leakage. She does have [name] suprapubic catheter 6/14; 4-week follow-up. She was doing really well the last time we saw her in the wound had filled in quite [name] bit. However since that time her wound is gotten [name] lot deeper. Apparently her bladder surgery failed and she is incontinent [name] lot of the time. Furthermore her mother suffered [name] stroke and is staying with her sister. She now has care attendance but I am not really certain about the amount of care she has. We have been using silver collagen but apparently the family has to buy this privately. 6/28; the wound measures slightly larger I certainly do not see the difference. It has 1.5 cm  of direct depth but not exposed to bone it does not appear to be infected. We had her using silver alginate although she does not seem to know what her mother's been dressing this with recently 7/12; 2-week follow-up. 1.7 mm of direct depth this is fluctuated [name] fair amount but I do not see any consistent trend towards closure. The tissue looks healthy minimal undermining no palpable bone. No evidence of surrounding infection. We have been using silver alginate wet-to-dry although the patient wants to go back to Anasept gel wet to dry 8/30; we have seen this patient in quite [name] while however her wound has come in nicely at roughly 0.8 or 0.9 mm of direct depth also  down in length and width. She is using Anasept wet-to-dry her mother is changing the dressing 9/27; 1 month follow-up. Since she is being here last she apparently has had an attempt to close her urethra by urology in [facility] this was temporarily successful but now is reopened.. We have been using Anasept wet-to-dry. Her variant of Medicaid will not pay for wound care supplies. Most of what the use is being paid for out of their own pocket 10/25; 1 month follow-up. Her wound is measuring better she is still using Anasept wet-to-dry. Her mother changes this twice [name] day because of drainage. Overall the wound dimensions are better. The patient states that her recent urologic procedure actually resulted in less incontinence which may be helping Apparently had her mattress overlay on her bed is disintegrating. She asked if I be willing to put in an order for replacement I told her we would try her best although I am not exactly sure how long Medicaid will allow for replacement 11/29; 1 month follow-up. She arrives with her mother who is her primary caregiver. There is still using Anasept wet-to-dry but her mother says because of drainage they are changing this 3 times [name] day. Dimensions are [name] little worse or as last time she was actually [name] little better 12/13; the patient's wound had deteriorated last time. I did [name] culture of the wound that showed of few rare Pseudomonas and [name] few rare Corynebacterium. The Corynebacterium is likely [name] skin contaminant. I did prescribe topical gentamicin under the silver alginate directed at the Pseudomonas. Her mother says that there is [name] lot of drainage she changes the odor gauze packing 3 times [name] day currently using 6 gentamicin under silver alginate covering all of this with ABDs 03/13/2021; patient is using silver alginate. Very little change in this wound this actually goes down to bone with [name] rim of tissue separating environment from underlying sacral bone. There is  no real granulation. At the base of this. She has not been systemically unwell. The wound itself not much changed however it abuts right on bone. She has Medicaid which does not give [place] [name] lot of options for either home care or different dressings. We thought this over in some detail. We might be able to get [name] wound VAC through Medicaid but we would not be able to get this changed by home PHEBIE, HYCHE [name] ([id number]) [phone].pdf Page 5 of 17 health. She lives in [place] [facility] [place] which she would be [name] far drive to this clinic twice [name] week. We might be able to teach the daughter or friend how to do this but there is [name] lot of issues that need to be worked through before we put this into the final ordering status 03/28/2021; the wound actually looks  somewhat better contracted. There is still some undermining but no evidence of infection. We have been using gentamicin and silver alginate. 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 She is apparently going for some form of tendon release next week by orthopedics. Her knees are fixed in extension right leg first apparently 2/16; the wound looks clean not much change in dimensions. She has undermining at roughly 4:00. There is no exposed bone. We are using silver alginate with underlying gentamicin. The last culture I did of this in December showed Pseudomonas which is the reason for the gentamicin topical Since patient was last here she had quadricep tendon release surgery at [facility] 05/09/2021: There has been improvement overall in the wound. The base is fairly clean with minimal slough. The size has contracted.[name] Unfortunately, one of her wounds from her quadricep tendon release is open and there is extensive nonviable tissue. She reports that she is going to have to have this operatively debrided. 06/06/2021: Since her last visit in clinic, she underwent debridement of the open wound  from her quadricep tendon release with placement of Kerecis and primary closure. Her sutures have been removed and she saw her surgeon last week. In the interim, [name] clip from her suprapubic catheter caused [name] new wound to occur on her right thigh and her old left heel pressure ulcer has reopened. Her sacral wound is smaller. None of the wounds appears infected, but there is epibole around the sacral wound and fairly thick slough on the heel wound. 06/20/2021: The heel ulcer is clean with good granulation tissue. The sacral wound is smaller but measures [name] little deeper today. There is heaped up senescent skin around the edges of the sacral wound. The wound on her thigh is [name] little bit bigger but remains fairly superficial. 07/04/2021: The heel ulcer is nearly completely healed. Very little open tissue. The sacral wound continues to contract around the cavity. There is [name] lot of heaped up senescent tissue around the edges of the wound. The wound on her thigh has not really made much improvement at all. It remains superficial but has [name] thick layer of yellow slough. No concern for infection in any of the wound sites. 07/18/2021: The heel ulcer has closed. The sacral wound is shallower with [name] little bit of slough in the wound base. There is less senescent tissue around the perimeter. The wound on her thigh looks like it might be [name] little deeper in the center. It continues to have [name] fibrous slough layer. No concern for infection. 07/25/2021: We initiated wound VAC therapy last week. The sacral wound is smaller but still has some undermining. There is just [name] little bit of slough in the wound base. The wound on her thigh we began treating with Santyl. The fibrinous slough has diminished and there is some granulation tissue beginning to form. 08/01/2021: For some reason, the sacral wound measures deeper today. It is fairly clean and there is no significant odor from the wound, but the patient's mother reports an increase  in the drainage. She has struggled with maintaining [name] good seal with the drape, given the location of the wound. On the other hand, the thigh wound is smaller and shallower. There is just some fibrinous slough on the surface. 08/09/2021: The patient was hospitalized last week with [name] urinary tract infection. She is still feeling [name] bit poorly today. She is currently taking [name] course of oral antibiotics. The sacral  wound is [name] little bit smaller today. It does have [name] layer of slough on the surface. The wound on her thigh continues to contract and is fairly superficial today. There is fibrinous slough at the site, as well. 08/23/2021: The patient was hospitalized again this last week with sepsis. It sounds like urology is planning to perform [name] cystectomy with ileal conduit to help with her urinary leakage issues. She apparently developed [name] cutaneous fungal infection where her wound VAC drape was and so that has been removed and her mom is doing wet-to-dry dressing changes. The periwound skin is now healing, but it is still fairly red and irritated. She has [name] deep tissue injury on the same heel that we had previously treated for an open ulcer, but there is currently no skin opening. The wound on her thigh is very superficial and clean without any slough accumulation. 08/30/2021: The heel looks good today and has not opened. The wound on her thigh is closed. The sacral wound looks better, particularly the periwound skin. Her mother has been applying [name] mixture of Desitin and miconazole to the periwound and just doing wet-to-dry dressings because we did not want to further irritate the skin with the wound VAC drape. The wound is clean with just [name] little bit of superficial slough and senescent skin heaped up around the margin. 09/05/2021: The periwound skin at her sacral ulcer site is markedly improved. Immediately surrounding the wound orifice, there is some thick, dried, and scaly skin. There is also some slough  buildup on the wound surface. No odor or significant drainage. 7/14; the patient still has the same depth however surrounding granulation looks better. Using wound VAC with topical Keystone antibiotics. She may be eligible for an improved mattress cover and we will look into it 10/11/2021: The wound is [name] little bit deeper today. She is struggling with urinary contamination of her sacral wound and has subsequently developed maceration and some tissue breakdown. No obvious external infection. There is good granulation tissue on the surface with just [name] little bit of light slough. 10/25/2021: The wound looks better today. The depth has come in by over [name] centimeter. Her skin is in better condition. There is heaped up senescent skin around the wound perimeter. She received her level 3 bed and is very happy with it. 11/08/2021: The undermining has come in substantially. The overall wound depth is about the same. She continues to build up senescent skin around the wound perimeter. 11/22/2021: The wound apparently measured [name] little bit deeper, but it no longer undermines much at all. It is clean. There is senescent skin accumulation around the perimeter, as usual. 01/14/2022: The patient returns to clinic after undergoing extensive surgical intervention at [facility] for her urethral erosion. This was complicated by sepsis secondary to pneumonia. Her wound actually looks quite good. The size is about the same, but the depth has come in considerably and there is less undermining. It is [name] little bit dry around the edges. No concern for infection. 03/17/2022: The patient has been in and out of the hospital since her last visit, primarily with episodes of sepsis related to urinary infections. They have been having issues with her urostomy bags leaking having issues and the patient's mother is concerned that some of the contaminated urine may be reaching her sacral ulcer, which has enlarged since  the last time we saw her. On inspection, the wound is [name] little larger, but it is very clean with just  some slough and accumulation of senescent skin around the margins. 04/16/2022: No real change to the sacral ulcer. It is clean without any significant slough or other debris accumulation. No concern for infection. 05/22/2022: The patient has been in the hospital and she was not on an appropriate surface for [name] time. She has also had some leakage from her urostomy due to the hospital not having the correct bags. Finally, her air-fluidized mattress has been leaking and was only just repaired. As result of these issues, her sacral ulcer is [name] little bit bigger, but it remains quite clean with good granulation tissue at the base. 06/19/2022: Her wound is smaller and shallower this week. There is [name] little bit of slough on the surface and some senescent skin around the edges. 07/17/2022: The wound has contracted further and is shallower again today. There is light slough on the wound surface and reaccumulation of the senescent skin around the edges. 08/14/2022: Although the wound measurements have not changed, the cavity has actually contracted quite [name] bit. There is some slough on the surface and reaccumulation of senescent skin around the wound margins. [name], [name] ([id number]) [id number] Page 6 of 17 09/11/2022: The external wound measurement is smaller but the depth is actually measuring [name] little bit deeper today. She has senescent skin that has reaccumulated around the orifice. 10/16/2022: She has [name] new wound on her right great toe. She apparently developed [name] pustular skin rash and had multiple draining sites; this area is the only remaining wound from that episode. The sacral ulcer is without any undermining, but did measure slightly deeper. She was in the hospital with pneumonia for about [name] week. 11/13/2022: The great toe wound is quite small and has [name] little bit of slough  and eschar on the surface. The sacral ulcer is shallower and narrower. She has not accumulated the senescent skin around the edges and there is no discernible slough on the surface. Electronic Signature(s) Signed: 11/13/2022 1:59:58 PM By: Duanne Guess MD FACS Entered By: Duanne Guess on 11/13/2022 10:59:57 -------------------------------------------------------------------------------- Physical Exam Details Patient Name: Date of Service: Jamie, BRANNAM [name]. 11/13/2022 12:30 PM Medical Record Number: [id number] Patient Account Number: [id number] Date of Birth/Sex: Treating RN: [birth year] (41 y.o. [name] Primary Care Provider: Delynn Flavin Other Clinician: Referring Provider: Treating Provider/Extender: [name] Weeks in Treatment: 332 Constitutional . . . . no acute distress. Respiratory Normal work of breathing on room air. Notes 11/13/2022: The great toe wound is quite small and has [name] little bit of slough and eschar on the surface. The sacral ulcer is shallower and narrower. She has not accumulated the senescent skin around the edges and there is no discernible slough on the surface. Electronic Signature(s) Signed: 11/13/2022 2:00:34 PM By: Duanne Guess MD FACS Entered By: Duanne Guess on 11/13/2022 11:00:34 -------------------------------------------------------------------------------- Physician Orders Details Patient Name: Date of Service: ROSANNA, BLANKLEY [name]. 11/13/2022 12:30 PM Medical Record Number: [id number] Patient Account Number: [id number] Date of Birth/Sex: Treating RN: 05-27-1980 (41 y.o. 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 - Dr. Lady Gary Room 1 ***HOYER*** Anesthetic Wound #1 Medial Sacrum (In clinic) Topical Lidocaine 4% applied to wound bed Wound #18 Right T Great oe (In clinic) Topical Lidocaine 4% applied to wound bed Bathing/ Shower/ Hygiene May shower and wash wound with soap and water. Off-Loading Low air-loss mattress (Group 2) - pt may go back to group 2 surface for comfort if pt desires [name] fluidized (Group 3) mattress - medical modalities ir Turn and reposition every 2 hours Wound Treatment Wound #1 - Sacrum Wound Laterality: Medial Cleanser: Soap and Water 1 x Per Day/30 Days Discharge Instructions: May shower and wash wound with dial antibacterial soap and water prior to dressing change. Cleanser: Anasept Antimicrobial Skin and Wound Cleanser, 8 (oz) 1 x Per Day/30 Days Discharge Instructions: Cleanse the wound with Anasept cleanser prior to applying [name] clean dressing using gauze sponges, not tissue or cotton balls. Peri-Wound Care: Zinc Oxide Ointment 30g tube 1 x Per Day/30 Days Discharge Instructions: Apply Zinc Oxide to periwound as needed for moisture Prim Dressing: Promogran Prisma Matrix, 4.34 (sq in) (silver collagen) 1 x Per Day/30 Days ary Discharge Instructions: Moisten collagen with saline or hydrogel and lay in bottom of wound bed. Prim Dressing: VASHE 1 x Per Day/30 Days ary Discharge Instructions: moisten gauze with Vashe and pack into wound Secondary Dressing: Zetuvit Plus Silicone Border Dressing 5x5 (in/in) 1 x Per Day/30 Days Discharge Instructions: Apply silicone border over primary dressing as directed. Wound #18 - T  Great oe Wound Laterality: Right Prim Dressing: Promogran Prisma Matrix, 4.34 (sq in) (silver collagen) Every Other Day/30 Days ary Discharge Instructions: Moisten collagen with saline or hydrogel Secondary Dressing: Woven Gauze Sponges 2x2 in Every Other Day/30 Days Discharge Instructions: Apply over primary dressing as directed. Secured With: Insurance underwriter, Sterile 2x75 (in/in) Every Other Day/30 Days Discharge Instructions: Secure with stretch gauze as directed. Electronic Signature(s) Signed: 11/13/2022 4:31:02 PM By: Duanne Guess MD FACS Entered By: Duanne Guess on 11/13/2022 11:00:48 -------------------------------------------------------------------------------- Problem List Details Patient Name: Date of Service: [name]. 11/13/2022 12:30 PM Medical Record Number: [id number] Patient Account Number: [id number] Date of Birth/Sex: Treating RN: 11/04/1980 (41 y.o. [name] Primary Care Provider: Delynn Flavin Other Clinician: Referring Provider: Treating Provider/Extender: [name] Weeks in Treatment: [phone] Active Problems ICD-10 Encounter TRANITA, TERRY [name] ([id number]) [id number] Page 8 of 17 Encounter Code Description Active Date MDM Diagnosis L89.154 Pressure ulcer of sacral region, stage 4 06/27/2016 No Yes L97.512 Non-pressure chronic ulcer of other part of right foot with fat layer exposed 10/16/2022 No Yes G82.22 Paraplegia, incomplete 06/27/2016 No Yes Inactive Problems ICD-10 Code Description Active Date Inactive Date L97.312 Non-pressure chronic ulcer of right 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 01/26/2017 01/26/2017 L97.322 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 01/26/2017 01/26/2017 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, sacral and sacrococcygeal region 06/27/2016 06/27/2016 T81.31XA Disruption of external operation (surgical) wound, not elsewhere  classified, initial 09/12/2016 09/12/2016 encounter L97.521 Non-pressure chronic ulcer of 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 11/06/2017 11/06/2017 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 11/20/2017 11/20/2017 H08.657 Pressure ulcer of left heel, stage 3 08/09/2019 08/09/2019 Resolved Problems ICD-10 Code Description Active Date Resolved Date L89.624 Pressure ulcer of left heel, stage 4 06/27/2016 06/27/2016 L97.811 Non-pressure chronic ulcer of other part of right lower leg limited to breakdown of skin 09/20/2018 09/20/2018 L89.620 Pressure ulcer of left  heel, unstageable 05/13/2017 05/13/2017 L97.218 Non-pressure chronic ulcer of right calf with other specified severity 09/12/2016 09/12/2016 Electronic Signature(s) Signed: 11/13/2022 1:51:16 PM By: Duanne Guess MD FACS Entered By: Duanne Guess on 11/13/2022 10:51:16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

## 2022-11-26 ENCOUNTER — Telehealth (INDEPENDENT_AMBULATORY_CARE_PROVIDER_SITE_OTHER): Payer: Medicaid Other | Admitting: Family Medicine

## 2022-11-26 ENCOUNTER — Other Ambulatory Visit: Payer: Self-pay | Admitting: Family Medicine

## 2022-11-26 ENCOUNTER — Encounter: Payer: Self-pay | Admitting: Family Medicine

## 2022-11-26 DIAGNOSIS — B86 Scabies: Secondary | ICD-10-CM | POA: Diagnosis not present

## 2022-11-26 DIAGNOSIS — L282 Other prurigo: Secondary | ICD-10-CM | POA: Diagnosis not present

## 2022-11-26 DIAGNOSIS — G8929 Other chronic pain: Secondary | ICD-10-CM

## 2022-11-26 MED ORDER — IVERMECTIN 3 MG PO TABS
200.0000 ug/kg | ORAL_TABLET | ORAL | 0 refills | Status: AC
Start: 2022-11-26 — End: 2022-12-04

## 2022-11-26 NOTE — Progress Notes (Signed)
Virtual Visit via Video   I connected with patient on 11/26/22 at 1110 by a video enabled telemedicine application and verified that I am speaking with the correct person using two identifiers.  Location patient: Home Location provider: [facility] Persons participating in the virtual visit: Patient and Provider  I discussed the limitations of evaluation and management by telemedicine and the availability of in person appointments. The patient expressed understanding and agreed to proceed.  Subjective:   HPI:  Pt presents today for  Chief Complaint  Patient presents with   Rash   Rash: Patient complains of rash involving the abdomen, face, and bilateral upper leg. Rash started several months ago. Appearance of rash at onset: Color of lesion(s): redness with central black spots per mother, Texture of lesion(s): raised, Size of lesion(s): varies. Rash has changed over time Initial distribution: generalized.  Discomfort associated with rash: is pruritic.  Associated symptoms: none. Denies: none. Patient has had previous evaluation of rash. Patient has had previous treatment.  Response to treatment: not resolved. Patient has not had contacts with similar rash. Patient has not identified precipitant. Patient has not had new exposures (soaps, lotions, laundry detergents, foods, medications, plants, insects or animals.)   ROS   Patient Active Problem List   Diagnosis Date Noted   Cellulitis 10/07/2022   Acute respiratory failure with hypoxia (HCC) 10/06/2022   Bacterial vaginosis 03/13/2022   Thrombocytosis 03/13/2022   Hypoalbuminemia due to protein-calorie malnutrition (HCC) 03/13/2022   Seizures (HCC) 03/13/2022   Anxiety 03/13/2022   Presence of intrathecal baclofen pump 01/13/2022   Major depressive disorder, recurrent episode, in full remission (HCC) 01/13/2022   Irritant contact dermatitis due to incontinence of both feces and urine 09/03/2021    UTI (urinary tract infection) 08/17/2021   Complicated open wound of right thigh    Fibrosis of right knee joint    S/P knee surgery 04/02/2021   Essential hypertension 04/02/2020   Erosion of urethra due to catheterization of urinary tract (HCC) 01/19/2020   Obesity, Class III, BMI 40-49.9 (morbid obesity) (HCC)    Bilateral ovarian cysts 12/06/2019   Muscle spasticity 12/06/2019   CAP (community acquired pneumonia) 10/30/2019   Chronic cystitis with hematuria 10/28/2019   Generalized anxiety disorder 07/04/2019   Controlled substance agreement signed 07/04/2019   Suprapubic catheter (HCC) 07/04/2019   Mixed hyperlipidemia 07/04/2019   Neurogenic bladder 07/04/2019   Stage IV pressure ulcer of sacral region [facility])    GERD (gastroesophageal reflux disease)    Chronic pain syndrome 05/04/2018   Anemia 06/10/2016   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 50-70) 08/06/2010    Social History   Tobacco Use   Smoking status: Former    Current packs/day: 0.00    Average packs/day: 2.0 packs/day for 22.0 years (44.0 ttl pk-yrs)    Types: Cigarettes    Start date: 10/29/1997    Quit date: 10/30/2019    Years since quitting: 3.0   Smokeless tobacco: Never  Substance Use Topics   Alcohol use: No    Current Outpatient Medications:    ivermectin (STROMECTOL) 3 MG TABS tablet, Take 6.5 tablets (19,500  mcg total) by mouth once a week for 2 doses., Disp: 13 tablet, Rfl: 0   acetaminophen (TYLENOL) 500 MG tablet, Take 1,000 mg by mouth daily as needed for moderate pain or headache., Disp: , Rfl:    busPIRone (BUSPAR) 15 MG  tablet, Take 1 tablet (15 mg total) by mouth 2 (two) times daily., Disp: 180 tablet, Rfl: 1   Cholecalciferol (VITAMIN D3) 50 MCG (2000 UT) TABS, Take 1 tablet (50 mcg total) by mouth daily., Disp: 90 tablet, Rfl: 1   citalopram (CELEXA) 40 MG tablet, TAKE 1 TABLET BY MOUTH EVERY DAY, Disp: 90 tablet, Rfl: 0   clindamycin (CLINDAGEL) 1 % gel, Apply topically 2 (two) times daily., Disp: 30 g, Rfl: 1   [employer] VITAMIN B12 1000 MCG tablet, TAKE 1 TABLET BY MOUTH EVERY DAY, Disp: 90 tablet, Rfl: 2   cyclobenzaprine (FLEXERIL) 5 MG tablet, TAKE 1 TABLET BY MOUTH THREE TIMES A DAY, Disp: 270 tablet, Rfl: 0   diclofenac Sodium (VOLTAREN) 1 % GEL, APPLY 2 GRAMS TO AFFECTED AREA 4 TIMES A DAY (Patient taking differently: Apply 2 g topically 4 (four) times daily as needed (pain).), Disp: 300 g, Rfl: 0   docusate sodium (COLACE) 100 MG capsule, Take 1 capsule (100 mg total) by mouth every 12 (twelve) hours as needed for mild constipation. (Patient taking differently: Take 100 mg by mouth daily as needed for mild constipation.), Disp: 180 capsule, Rfl: 3   doxycycline (VIBRA-TABS) 100 MG tablet, Take 1 tablet (100 mg total) by mouth daily., Disp: 90 tablet, Rfl: 3   feeding supplement (ENSURE ENLIVE / ENSURE PLUS) LIQD, Take 237 mLs by mouth 2 (two) times daily between meals., Disp: 237 mL, Rfl: 12   fluticasone (FLONASE) 50 MCG/ACT nasal spray, Place 2 sprays into both nostrils daily., Disp: 48 mL, Rfl: 2   gabapentin (NEURONTIN) 600 MG tablet, Take 1 tablet (600 mg total) by mouth in the morning, at noon, in the evening, and at bedtime., Disp: 360 tablet, Rfl: 1   HYDROcodone bit-homatropine (HYCODAN) 5-1.5 MG/5ML syrup, Take 5 mLs by mouth every 4 (four) hours as needed for cough., Disp: 120 mL, Rfl: 0   hydrOXYzine (ATARAX) 25 MG tablet, TAKE 1 TABLET BY MOUTH 3 TIMES DAILY AS NEEDED FOR ANXIETY., Disp: 270 tablet, Rfl: 0   Ipratropium-Albuterol (COMBIVENT RESPIMAT) 20-100 MCG/ACT AERS respimat, Inhale 1 puff into  the lungs every 6 (six) hours as needed., Disp: 4 g, Rfl: 5   lacosamide (VIMPAT) 200 MG TABS tablet, Take 1 tablet (200 mg total) by mouth 2 (two) times daily., Disp: 60 tablet, Rfl: 1   leptospermum manuka honey (MEDIHONEY) PSTE paste, Apply 1 Application topically daily., Disp: 15 mL, Rfl: 0   levocetirizine (XYZAL) 5 MG tablet, Take 1 tablet (5 mg total) by mouth every evening. (Patient taking differently: Take 5 mg by mouth at bedtime as needed for allergies.), Disp: 90 tablet, Rfl: 1   levothyroxine (SYNTHROID) 88 MCG tablet, Take 1 tablet (88 mcg total) by mouth daily before breakfast., Disp: 90 tablet, Rfl: 3   Lidocaine 4 % PTCH, Place 1 patch onto the skin daily as needed (pain)., Disp: , Rfl:    meclizine (ANTIVERT) 12.5 MG tablet, Take 1 tablet (12.5 mg total) by mouth 3 (three) times daily as needed for dizziness., Disp: 30 tablet, Rfl: 2   metroNIDAZOLE (FLAGYL) 500 MG tablet, Take 1 tablet (500 mg total) by mouth every 12 (twelve) hours., Disp: 12 tablet, Rfl: 0   nystatin (NYAMYC)  powder, APPLY 1 APPLICATION TOPICALLY 3 (THREE) TIMES DAILY., Disp: 60 g, Rfl: 0   omeprazole (PRILOSEC) 20 MG capsule, TAKE 1 CAPSULE BY MOUTH EVERY DAY, Disp: 90 capsule, Rfl: 0   Oxycodone HCl 10 MG TABS, Take 1 tablet (10 mg total) by mouth in the morning, at noon, and at bedtime. 90 tabs for 30 days, Disp: 90 tablet, Rfl: 0   PAIN MANAGEMENT INTRATHECAL, IT, PUMP, 1 each by Intrathecal route Continuous EPIDURAL. Intrathecal (IT) medication:  Baclofen, Disp: , Rfl:   Allergies  Allergen Reactions   Macrobid [Nitrofurantoin]     Not effective for patient. "Lung issues"   Lisinopril Cough   Nickel Rash    Rash and green coloring when wearing jewelry containing nickel.    Objective:   There were no vitals taken for this visit.  Resting comfortably in bed at home.  Head is normocephalic, atraumatic.  No labored breathing.  Patient is alert and oriented at baseline.  Pt has scattered red, raised  lesions to face, abdomen and upper legs. Some in linear patterns.   Assessment and Plan:   Carlea was seen today for rash.  Diagnoses and all orders for this visit:  Pruritic rash Mother has multiple specimens of what she has removed from the patient. She will bring for lab analysis. Referral to dermatology for recurrent rash.  -     Ambulatory referral to Dermatology -     Arthropod Identification -     Parasite ID, Worm  Scabies Has been treated for MRSA, acne, and contact dermatitis without resolution of symptoms. Concerns for scabies. Discussed this in detail with mother. Will treat with oral Ivermectin. Mother aware to follow up with dermatology as referred.  -     ivermectin (STROMECTOL) 3 MG TABS tablet; Take 6.5 tablets (19,500 mcg total) by mouth once a week for 2 doses.      Return if symptoms worsen or fail to improve.  Kari Baars, [name]-C [facility] [address] [place], [place] [address] [phone]  11/26/2022  Time spent with the patient: 25 minutes, of which >50% was spent in obtaining information about symptoms, reviewing previous labs, evaluations, and treatments, counseling about condition (please see the discussed topics above), and developing a plan to further investigate it; had a number of questions which I addressed.

## 2022-11-27 ENCOUNTER — Telehealth: Payer: Self-pay | Admitting: Family Medicine

## 2022-11-27 LAB — PARASITE ID, WORM

## 2022-11-27 LAB — ARTHROPOD IDENTIFICATION

## 2022-11-27 NOTE — Telephone Encounter (Signed)
Specimens have been sent to lab for analysis. No results as this will take several days. I have sent in oral medications to treat mites.

## 2022-11-27 NOTE — Telephone Encounter (Signed)
Mom aware and verbalizes understanding.

## 2022-12-02 NOTE — Telephone Encounter (Signed)
Again, needs to be seen.

## 2022-12-02 NOTE — Telephone Encounter (Signed)
Or GI if she has a GI specialist

## 2022-12-02 NOTE — Telephone Encounter (Signed)
DOD is her only option right now.

## 2022-12-02 NOTE — Telephone Encounter (Signed)
Patient needs to be seen with Dr. Reece Agar for worms crawling out of her body, and in her bowels. Please call.

## 2022-12-03 ENCOUNTER — Telehealth: Payer: Self-pay | Admitting: Family Medicine

## 2022-12-03 ENCOUNTER — Other Ambulatory Visit: Payer: Self-pay | Admitting: Family Medicine

## 2022-12-03 DIAGNOSIS — F3342 Major depressive disorder, recurrent, in full remission: Secondary | ICD-10-CM

## 2022-12-03 DIAGNOSIS — F411 Generalized anxiety disorder: Secondary | ICD-10-CM

## 2022-12-04 ENCOUNTER — Encounter: Payer: Self-pay | Admitting: Family Medicine

## 2022-12-04 NOTE — Telephone Encounter (Signed)
Letter faxed to [facility] and pt aware.

## 2022-12-05 ENCOUNTER — Encounter: Payer: Self-pay | Admitting: Family Medicine

## 2022-12-05 ENCOUNTER — Ambulatory Visit (INDEPENDENT_AMBULATORY_CARE_PROVIDER_SITE_OTHER): Payer: Medicaid Other | Admitting: Family Medicine

## 2022-12-05 VITALS — BP 125/86 | HR 92 | Temp 98.3°F

## 2022-12-05 DIAGNOSIS — L282 Other prurigo: Secondary | ICD-10-CM | POA: Diagnosis not present

## 2022-12-05 DIAGNOSIS — Z23 Encounter for immunization: Secondary | ICD-10-CM
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
## 2022-12-08 ENCOUNTER — Encounter: Payer: Self-pay | Admitting: Family Medicine

## 2022-12-08 LAB — CBC WITH DIFFERENTIAL/PLATELET
Basophils Absolute: 0.1 10*3/uL (ref 0.0–0.2)
Basos: 1 %
EOS (ABSOLUTE): 0.2 10*3/uL (ref 0.0–0.4)
Eos: 2 %
Hematocrit: 41.5 % (ref 34.0–46.6)
Hemoglobin: 12.7 g/dL (ref 11.1–15.9)
Immature Grans (Abs): 0 10*3/uL (ref 0.0–0.1)
Immature Granulocytes: 0 %
Lymphocytes Absolute: 3.1 10*3/uL (ref 0.7–3.1)
Lymphs: 24 %
MCH: 27 pg (ref 26.6–33.0)
MCHC: 30.6 g/dL — ABNORMAL LOW (ref 31.5–35.7)
MCV: 88 fL (ref 79–97)
Monocytes Absolute: 0.8 10*3/uL (ref 0.1–0.9)
Monocytes: 6 %
Neutrophils Absolute: 9 10*3/uL — ABNORMAL HIGH (ref 1.4–7.0)
Neutrophils: 67 %
Platelets: 637 10*3/uL — ABNORMAL HIGH (ref 150–450)
RBC: 4.7 x10E6/uL (ref 3.77–5.28)
RDW: 13.9 % (ref 11.7–15.4)
WBC: 13.2 10*3/uL — ABNORMAL HIGH (ref 3.4–10.8)

## 2022-12-08 LAB — PARASITE EXAM, BLOOD

## 2022-12-09 ENCOUNTER — Other Ambulatory Visit: Payer: Self-pay | Admitting: *Deleted

## 2022-12-09 DIAGNOSIS — D72829 Elevated white blood cell count, unspecified: Secondary | ICD-10-CM

## 2022-12-10 ENCOUNTER — Encounter (HOSPITAL_BASED_OUTPATIENT_CLINIC_OR_DEPARTMENT_OTHER): Payer: Medicaid Other | Attending: General Surgery | Admitting: General Surgery

## 2022-12-10 DIAGNOSIS — G8222 Paraplegia, incomplete: Secondary | ICD-10-CM | POA: Insufficient documentation

## 2022-12-10 DIAGNOSIS — Z87891 Personal history of nicotine dependence: Secondary | ICD-10-CM | POA: Diagnosis not present

## 2022-12-10 DIAGNOSIS — L89154 Pressure ulcer of sacral region, stage 4: Secondary | ICD-10-CM | POA: Diagnosis present

## 2022-12-10 DIAGNOSIS — L97512 Non-pressure chronic ulcer of other part of right foot with fat layer exposed: Secondary | ICD-10-CM | POA: Insufficient documentation

## 2022-12-10 NOTE — Progress Notes (Signed)
red, brown N/[name] N/[name] Exudate Color: [name], [name] ([id number]) [id number] Page 3 of 9 Distinct, outline attached Flat and Intact N/[name] Wound Margin: Large (67-100%) None Present (0%) N/[name] Granulation Amount: Pink N/[name] N/[name] Granulation Quality: Small (1-33%) Large (67-100%) N/[name] Necrotic Amount: Adherent Slough Eschar N/[name] Necrotic Tissue: Fat Layer (Subcutaneous Tissue): Yes Fascia: No N/[name] Exposed Structures: Fascia: No Fat Layer (Subcutaneous Tissue): No Tendon: No Tendon: No Muscle: No Muscle: No Joint: No Joint: No Bone: No Bone: No Small (1-33%) Large (67-100%) N/[name] Epithelialization: Debridement - Selective/Open Wound Debridement - Selective/Open Wound N/[name] Debridement: Pre-procedure Verification/Time Out 13:40 13:40 N/[name] Taken: Lidocaine 4% Topical Solution Lidocaine 4% Topical Solution N/[name] Pain Control: N/[name] Necrotic/Eschar N/[name] Tissue Debrided: Skin/Epidermis Non-Viable Tissue N/[name] Level: 1.57 0.01 N/[name] Debridement [name] (sq cm): rea Curette Curette N/[name] Instrument: Minimum Minimum N/[name] Bleeding: Pressure Pressure  N/[name] Hemostasis [name] chieved: Procedure was tolerated well Procedure was tolerated well N/[name] Debridement Treatment Response: 2x1x0.5 0.1x0.1x0.1 N/[name] Post Debridement Measurements L x W x D (cm) 0.785 0.001 N/[name] Post Debridement Volume: (cm) Category/Stage IV N/[name] N/[name] Post Debridement Stage: Rash: No No Abnormalities Noted N/[name] Periwound Skin Texture: Maceration: Yes No Abnormalities Noted N/[name] Periwound Skin Moisture: Dry/Scaly: No No Abnormalities Noted No Abnormalities Noted N/[name] Periwound Skin Color: No Abnormality No Abnormality N/[name] Temperature: Debridement Debridement N/[name] Procedures Performed: Treatment Notes Wound #1 (Sacrum) Wound Laterality: Medial Cleanser Soap and Water Discharge Instruction: May shower and wash wound with dial antibacterial soap and water prior to dressing change. Anasept Antimicrobial Skin and Wound Cleanser, 8 (oz) Discharge Instruction: Cleanse the wound with Anasept cleanser prior to applying [name] clean dressing using gauze sponges, not tissue or cotton balls. Peri-Wound Care Zinc Oxide Ointment 30g tube Discharge Instruction: Apply Zinc Oxide to periwound as needed for moisture Topical Primary Dressing Promogran Prisma Matrix, 4.34 (sq in) (silver collagen) Discharge Instruction: Moisten collagen with saline or hydrogel and lay in bottom of wound bed. VASHE Discharge Instruction: moisten gauze with Vashe and pack into wound Secondary Dressing Zetuvit Plus Silicone Border Dressing 5x5 (in/in) Discharge Instruction: Apply silicone border over primary dressing as directed. Secured With Compression Wrap Compression Stockings Add-Ons Wound #18 (Toe Great) Wound Laterality: Right Cleanser Peri-Wound Care Topical Primary Dressing Promogran Prisma Matrix, 4.34 (sq in) (silver collagen) Discharge Instruction: Moisten collagen with saline or hydrogel Livers, [name] ([id number]) [id number] Page 4 of 9 Secondary  Dressing Woven Gauze Sponges 2x2 in Discharge Instruction: Apply over primary dressing as directed. Secured With Conforming Stretch Gauze Bandage, Sterile 2x75 (in/in) Discharge Instruction: Secure with stretch gauze as directed. Compression Wrap Compression Stockings Add-Ons Electronic Signature(s) Signed: 12/10/2022 2:06:03 PM By: Duanne Guess MD FACS Entered By: Duanne Guess on 12/10/2022 11:06:02 -------------------------------------------------------------------------------- Multi-Disciplinary Care Plan Details Patient Name: Date of Service: [name], [name] 12/10/2022 1:15 PM Medical Record Number: [id number] Patient Account Number: [id number] Date of Birth/Sex: Treating RN: 26-Mar-1980 (41 y.o. [name] Primary Care Kehinde Bowdish: Delynn Flavin Other Clinician: Referring Rashon Westrup: Treating Jovahn Breit/Extender: [name] Weeks in Treatment: 336 Multidisciplinary Care Plan reviewed with physician Active Inactive Pressure Nursing Diagnoses: Knowledge deficit related to management of pressures ulcers Potential for impaired tissue integrity related to pressure, friction, moisture, and shear Goals: Patient will remain free from development of additional pressure ulcers Date Initiated: 06/27/2016 Date Inactivated: 01/17/2019 Target Resolution Date: 12/24/2018 Goal Status: Met Patient/caregiver will verbalize risk factors for pressure ulcer development Date Initiated: 06/27/2016 Date Inactivated: 07/08/2017 Target Resolution Date: 07/08/2017 Goal Status: Met Patient/caregiver will verbalize understanding of  pressure ulcer management Date Initiated: 06/27/2016 Target Resolution Date: 01/30/2023 Goal Status: Active Interventions: Assess: immobility, friction, shearing, incontinence upon admission and as needed Assess offloading mechanisms upon admission and as needed Assess potential for pressure ulcer upon admission and as  needed Provide education on pressure ulcers Treatment Activities: Patient referred for pressure reduction/relief devices : 06/27/2016 Pressure reduction/relief device ordered : 06/27/2016 Notes: Wound/Skin Impairment Nursing Diagnoses: Impaired tissue integrity Knowledge deficit related to smoking impact on wound healing Arrambide, [name] ([id number]) [id number] Page 5 of 9 Knowledge deficit related to ulceration/compromised skin integrity Goals: Patient will demonstrate [name] reduced rate of smoking or cessation of smoking Date Initiated: 06/27/2016 Date Inactivated: 01/26/2017 Target Resolution Date: 01/08/2017 Goal Status: Unmet Unmet Reason: pt continues to smoke Patient/caregiver will verbalize understanding of skin care regimen Date Initiated: 06/27/2016 Target Resolution Date: 01/30/2023 Goal Status: Active Ulcer/skin breakdown will have [name] volume reduction of 50% by week 8 Date Initiated: 06/27/2016 Date Inactivated: 12/11/2016 Target Resolution Date: 07/25/2016 Goal Status: Met Interventions: Assess patient/caregiver ability to obtain necessary supplies Assess patient/caregiver ability to perform ulcer/skin care regimen upon admission and as needed Assess ulceration(s) every visit Provide education on smoking Provide education on ulcer and skin care Treatment Activities: Skin care regimen initiated : 06/27/2016 Topical wound management initiated : 06/27/2016 Notes: Electronic Signature(s) Signed: 12/10/2022 4:08:12 PM By: Samuella Bruin Entered By: Samuella Bruin on 12/10/2022 10:42:22 -------------------------------------------------------------------------------- Pain Assessment Details Patient Name: Date of Service: [name], [name]. 12/10/2022 1:15 PM Medical Record Number: [id number] Patient Account Number: [id number] Date of Birth/Sex: Treating RN: 09/26/1980 (41 y.o. [name] Primary Care Paxten Appelt: Delynn Flavin  Other Clinician: Referring Elane Peabody: Treating Vonya Ohalloran/Extender: [name] Weeks in Treatment: 336 Active Problems Location of Pain Severity and Description of Pain Patient Has Paino No Site Locations Rate the pain. Current Pain Level: 0 Pain Management and Medication Current Pain Management: FILICIA, SCOGIN [name] ([id number]) [id number] Page 6 of 9 Electronic Signature(s) Signed: 12/10/2022 4:08:12 PM By: Gelene Mink By: Samuella Bruin on 12/10/2022 10:20:54 -------------------------------------------------------------------------------- Patient/Caregiver Education Details Patient Name: Date of Service: [name] 10/9/2024andnbsp1:15 PM Medical Record Number: [id number] Patient Account Number: [id number] Date of Birth/Gender: Treating RN: [birth year] (41 y.o. [name] Primary Care Physician: Delynn Flavin Other Clinician: Referring Physician: Treating Physician/Extender: [name] in Treatment: 336 Education Assessment Education Provided To: Patient Education Topics Provided Wound/Skin Impairment: Methods: Explain/Verbal Responses: Reinforcements needed, State content correctly Electronic Signature(s) Signed: 12/10/2022 4:08:12 PM By: Samuella Bruin Entered By: Samuella Bruin on 12/10/2022 10:42:36 -------------------------------------------------------------------------------- Wound Assessment Details Patient Name: Date of Service: [name], [name] 12/10/2022 1:15 PM Medical Record Number: [id number] Patient Account Number: [id number] Date of Birth/Sex: Treating RN: 1981/01/05 (41 y.o. [name] Primary Care Dorrine Montone: Delynn Flavin Other Clinician: Referring Allyse Fregeau: Treating Hudson Lehmkuhl/Extender: [name] Weeks in Treatment: 336 Wound Status Wound Number: 1 Primary Etiology: Pressure  Ulcer Wound Location: Medial Sacrum Wound Status: Open Wounding Event: Pressure Injury Comorbid History: Anemia, Osteomyelitis Date Acquired: 05/15/2016 Weeks Of Treatment: 336 Clustered Wound: No Photos [name], [name] ([id number]) [id number] Page 7 of 9 Wound Measurements Length: (cm) 2 Width: (cm) 1 Depth: (cm) 0.5 Area: (cm) 1.571 Volume: (cm) 0.785 % Reduction in Area: 95.2% % Reduction in Volume: 99.5% Epithelialization: Small (1-33%) Undermining: Yes Starting Position (o'clock): 10 Ending Position (o'clock): 7 Maximum Distance: (cm) 1 Wound Description Classification: Category/Stage IV Wound Margin: Distinct, outline attached Exudate Amount: Medium Exudate Type: Serosanguineous Exudate Color: red, brown  pressure ulcer management Date Initiated: 06/27/2016 Target Resolution Date: 01/30/2023 Goal Status: Active Interventions: Assess: immobility, friction, shearing, incontinence upon admission and as needed Assess offloading mechanisms upon admission and as needed Assess potential for pressure ulcer upon admission and as  needed Provide education on pressure ulcers Treatment Activities: Patient referred for pressure reduction/relief devices : 06/27/2016 Pressure reduction/relief device ordered : 06/27/2016 Notes: Wound/Skin Impairment Nursing Diagnoses: Impaired tissue integrity Knowledge deficit related to smoking impact on wound healing Arrambide, [name] ([id number]) [id number] Page 5 of 9 Knowledge deficit related to ulceration/compromised skin integrity Goals: Patient will demonstrate [name] reduced rate of smoking or cessation of smoking Date Initiated: 06/27/2016 Date Inactivated: 01/26/2017 Target Resolution Date: 01/08/2017 Goal Status: Unmet Unmet Reason: pt continues to smoke Patient/caregiver will verbalize understanding of skin care regimen Date Initiated: 06/27/2016 Target Resolution Date: 01/30/2023 Goal Status: Active Ulcer/skin breakdown will have [name] volume reduction of 50% by week 8 Date Initiated: 06/27/2016 Date Inactivated: 12/11/2016 Target Resolution Date: 07/25/2016 Goal Status: Met Interventions: Assess patient/caregiver ability to obtain necessary supplies Assess patient/caregiver ability to perform ulcer/skin care regimen upon admission and as needed Assess ulceration(s) every visit Provide education on smoking Provide education on ulcer and skin care Treatment Activities: Skin care regimen initiated : 06/27/2016 Topical wound management initiated : 06/27/2016 Notes: Electronic Signature(s) Signed: 12/10/2022 4:08:12 PM By: Samuella Bruin Entered By: Samuella Bruin on 12/10/2022 10:42:22 -------------------------------------------------------------------------------- Pain Assessment Details Patient Name: Date of Service: [name], [name]. 12/10/2022 1:15 PM Medical Record Number: [id number] Patient Account Number: [id number] Date of Birth/Sex: Treating RN: 09/26/1980 (41 y.o. [name] Primary Care Paxten Appelt: Delynn Flavin  Other Clinician: Referring Elane Peabody: Treating Vonya Ohalloran/Extender: [name] Weeks in Treatment: 336 Active Problems Location of Pain Severity and Description of Pain Patient Has Paino No Site Locations Rate the pain. Current Pain Level: 0 Pain Management and Medication Current Pain Management: FILICIA, SCOGIN [name] ([id number]) [id number] Page 6 of 9 Electronic Signature(s) Signed: 12/10/2022 4:08:12 PM By: Gelene Mink By: Samuella Bruin on 12/10/2022 10:20:54 -------------------------------------------------------------------------------- Patient/Caregiver Education Details Patient Name: Date of Service: [name] 10/9/2024andnbsp1:15 PM Medical Record Number: [id number] Patient Account Number: [id number] Date of Birth/Gender: Treating RN: [birth year] (41 y.o. [name] Primary Care Physician: Delynn Flavin Other Clinician: Referring Physician: Treating Physician/Extender: [name] in Treatment: 336 Education Assessment Education Provided To: Patient Education Topics Provided Wound/Skin Impairment: Methods: Explain/Verbal Responses: Reinforcements needed, State content correctly Electronic Signature(s) Signed: 12/10/2022 4:08:12 PM By: Samuella Bruin Entered By: Samuella Bruin on 12/10/2022 10:42:36 -------------------------------------------------------------------------------- Wound Assessment Details Patient Name: Date of Service: [name], [name] 12/10/2022 1:15 PM Medical Record Number: [id number] Patient Account Number: [id number] Date of Birth/Sex: Treating RN: 1981/01/05 (41 y.o. [name] Primary Care Dorrine Montone: Delynn Flavin Other Clinician: Referring Allyse Fregeau: Treating Hudson Lehmkuhl/Extender: [name] Weeks in Treatment: 336 Wound Status Wound Number: 1 Primary Etiology: Pressure  Ulcer Wound Location: Medial Sacrum Wound Status: Open Wounding Event: Pressure Injury Comorbid History: Anemia, Osteomyelitis Date Acquired: 05/15/2016 Weeks Of Treatment: 336 Clustered Wound: No Photos [name], [name] ([id number]) [id number] Page 7 of 9 Wound Measurements Length: (cm) 2 Width: (cm) 1 Depth: (cm) 0.5 Area: (cm) 1.571 Volume: (cm) 0.785 % Reduction in Area: 95.2% % Reduction in Volume: 99.5% Epithelialization: Small (1-33%) Undermining: Yes Starting Position (o'clock): 10 Ending Position (o'clock): 7 Maximum Distance: (cm) 1 Wound Description Classification: Category/Stage IV Wound Margin: Distinct, outline attached Exudate Amount: Medium Exudate Type: Serosanguineous Exudate Color: red, brown  [name], [name] ([id number]) [id number] Page 1 of 9 Visit Report for 12/10/2022 Arrival Information Details Patient Name: Date of Service: [name], [name] 12/10/2022 1:15 PM Medical Record Number: [id number] Patient Account Number: [id number] Date of Birth/Sex: Treating RN: 03/23/1980 (41 y.o. [name] Primary Care Nyjae Hodge: Delynn Flavin Other Clinician: Referring Corrissa Martello: Treating Crist Kruszka/Extender: [name] in Treatment: 336 Visit Information History Since Last Visit Added or deleted any medications: No Patient Arrived: Wheel Chair Any new allergies or adverse reactions: No Arrival Time: 13:18 Had [name] fall or experienced change in No Accompanied By: mother activities of daily living that may affect Transfer Assistance: Michiel Sites Lift risk of falls: Patient Identification Verified: Yes Signs or symptoms of abuse/neglect since last visito No Secondary Verification Process Completed: Yes Hospitalized since last visit: No Patient Requires Transmission-Based Precautions: No Implantable device outside of the clinic excluding No Patient Has Alerts: No cellular tissue based products placed in the center since last visit: Has Dressing in Place as Prescribed: Yes Pain Present Now: No Electronic Signature(s) Signed: 12/10/2022 4:08:12 PM By: Samuella Bruin Entered By: Samuella Bruin on 12/10/2022 10:58:29 -------------------------------------------------------------------------------- Encounter Discharge Information Details Patient Name: Date of Service: Jamie Rink [name]. 12/10/2022 1:15 PM Medical Record Number: [id number] Patient Account Number: [id number] Date of Birth/Sex: Treating RN: [birth year] (41 y.o. [name] Primary Care Shamecca Whitebread: Delynn Flavin Other Clinician: Referring Ladarious Kresse: Treating Akasha Melena/Extender: [name] Weeks in Treatment:  [phone] Encounter Discharge Information Items Post Procedure Vitals Discharge Condition: Stable Temperature (F): 98.7 Ambulatory Status: Wheelchair Pulse (bpm): 88 Discharge Destination: Home Respiratory Rate (breaths/min): 16 Transportation: Private Auto Blood Pressure (mmHg): 122/79 Accompanied By: mother Schedule Follow-up Appointment: Yes Clinical Summary of Care: Patient Declined Electronic Signature(s) Signed: 12/10/2022 4:08:12 PM By: Samuella Bruin Entered By: Samuella Bruin on 12/10/2022 10:58:24 [name] ([id number]) [id number] Page 2 of 9 -------------------------------------------------------------------------------- Lower Extremity Assessment Details Patient Name: Date of Service: [name], [name] 12/10/2022 1:15 PM Medical Record Number: [id number] Patient Account Number: [id number] Date of Birth/Sex: Treating RN: 03-18-1980 (41 y.o. [name] Primary Care Rosia Syme: Delynn Flavin Other Clinician: Referring Earleen Aoun: Treating Cambelle Suchecki/Extender: [name] Weeks in Treatment: 336 Electronic Signature(s) Signed: 12/10/2022 4:08:12 PM By: Samuella Bruin Entered By: Samuella Bruin on 12/10/2022 10:20:57 -------------------------------------------------------------------------------- Multi Wound Chart Details Patient Name: Date of Service: [name], PALLESCHI 12/10/2022 1:15 PM Medical Record Number: [id number] Patient Account Number: [id number] Date of Birth/Sex: Treating RN: 27-Mar-1980 (41 y.o. F) Primary Care Jerric Oyen: Delynn Flavin Other Clinician: Referring Kallan Bischoff: Treating Nohelia Valenza/Extender: [name] Weeks in Treatment: 336 Vital Signs Height(in): 65 Pulse(bpm): 88 Weight(lbs): 201 Blood Pressure(mmHg): 122/79 Body Mass Index(BMI): 33.4 Temperature(F): 98.7 Respiratory Rate(breaths/min): 16 [1:Photos:] [N/[name]:N/[name]] Medial Sacrum Right T  Great oe N/[name] Wound Location: Pressure Injury Blister N/[name] Wounding Event: Pressure Ulcer Infection - not elsewhere classified N/[name] Primary Etiology: Anemia, Osteomyelitis Anemia, Osteomyelitis N/[name] Comorbid History: 05/15/2016 09/14/2022 N/[name] Date Acquired: 336 7 N/[name] Weeks of Treatment: Open Open N/[name] Wound Status: No No N/[name] Wound Recurrence: 2x1x0.5 0.1x0.1x0.1 N/[name] Measurements L x W x D (cm) 1.571 0.008 N/[name] [name] (cm) : rea 0.785 0.001 N/[name] Volume (cm) : 95.20% 96.60% N/[name] % Reduction in [name] rea: 99.50% 95.80% N/[name] % Reduction in Volume: 10 Starting Position 1 (o'clock): 7 Ending Position 1 (o'clock): 1 Maximum Distance 1 (cm): Yes No N/[name] Undermining: Category/Stage IV Full Thickness Without Exposed N/[name] Classification: Support Structures Medium None Present N/[name] Exudate Amount: Serosanguineous N/[name] N/[name] Exudate Type:  pressure ulcer management Date Initiated: 06/27/2016 Target Resolution Date: 01/30/2023 Goal Status: Active Interventions: Assess: immobility, friction, shearing, incontinence upon admission and as needed Assess offloading mechanisms upon admission and as needed Assess potential for pressure ulcer upon admission and as  needed Provide education on pressure ulcers Treatment Activities: Patient referred for pressure reduction/relief devices : 06/27/2016 Pressure reduction/relief device ordered : 06/27/2016 Notes: Wound/Skin Impairment Nursing Diagnoses: Impaired tissue integrity Knowledge deficit related to smoking impact on wound healing Arrambide, [name] ([id number]) [id number] Page 5 of 9 Knowledge deficit related to ulceration/compromised skin integrity Goals: Patient will demonstrate [name] reduced rate of smoking or cessation of smoking Date Initiated: 06/27/2016 Date Inactivated: 01/26/2017 Target Resolution Date: 01/08/2017 Goal Status: Unmet Unmet Reason: pt continues to smoke Patient/caregiver will verbalize understanding of skin care regimen Date Initiated: 06/27/2016 Target Resolution Date: 01/30/2023 Goal Status: Active Ulcer/skin breakdown will have [name] volume reduction of 50% by week 8 Date Initiated: 06/27/2016 Date Inactivated: 12/11/2016 Target Resolution Date: 07/25/2016 Goal Status: Met Interventions: Assess patient/caregiver ability to obtain necessary supplies Assess patient/caregiver ability to perform ulcer/skin care regimen upon admission and as needed Assess ulceration(s) every visit Provide education on smoking Provide education on ulcer and skin care Treatment Activities: Skin care regimen initiated : 06/27/2016 Topical wound management initiated : 06/27/2016 Notes: Electronic Signature(s) Signed: 12/10/2022 4:08:12 PM By: Samuella Bruin Entered By: Samuella Bruin on 12/10/2022 10:42:22 -------------------------------------------------------------------------------- Pain Assessment Details Patient Name: Date of Service: [name], [name]. 12/10/2022 1:15 PM Medical Record Number: [id number] Patient Account Number: [id number] Date of Birth/Sex: Treating RN: 09/26/1980 (41 y.o. [name] Primary Care Paxten Appelt: Delynn Flavin  Other Clinician: Referring Elane Peabody: Treating Vonya Ohalloran/Extender: [name] Weeks in Treatment: 336 Active Problems Location of Pain Severity and Description of Pain Patient Has Paino No Site Locations Rate the pain. Current Pain Level: 0 Pain Management and Medication Current Pain Management: FILICIA, SCOGIN [name] ([id number]) [id number] Page 6 of 9 Electronic Signature(s) Signed: 12/10/2022 4:08:12 PM By: Gelene Mink By: Samuella Bruin on 12/10/2022 10:20:54 -------------------------------------------------------------------------------- Patient/Caregiver Education Details Patient Name: Date of Service: [name] 10/9/2024andnbsp1:15 PM Medical Record Number: [id number] Patient Account Number: [id number] Date of Birth/Gender: Treating RN: [birth year] (41 y.o. [name] Primary Care Physician: Delynn Flavin Other Clinician: Referring Physician: Treating Physician/Extender: [name] in Treatment: 336 Education Assessment Education Provided To: Patient Education Topics Provided Wound/Skin Impairment: Methods: Explain/Verbal Responses: Reinforcements needed, State content correctly Electronic Signature(s) Signed: 12/10/2022 4:08:12 PM By: Samuella Bruin Entered By: Samuella Bruin on 12/10/2022 10:42:36 -------------------------------------------------------------------------------- Wound Assessment Details Patient Name: Date of Service: [name], [name] 12/10/2022 1:15 PM Medical Record Number: [id number] Patient Account Number: [id number] Date of Birth/Sex: Treating RN: 1981/01/05 (41 y.o. [name] Primary Care Dorrine Montone: Delynn Flavin Other Clinician: Referring Allyse Fregeau: Treating Hudson Lehmkuhl/Extender: [name] Weeks in Treatment: 336 Wound Status Wound Number: 1 Primary Etiology: Pressure  Ulcer Wound Location: Medial Sacrum Wound Status: Open Wounding Event: Pressure Injury Comorbid History: Anemia, Osteomyelitis Date Acquired: 05/15/2016 Weeks Of Treatment: 336 Clustered Wound: No Photos [name], [name] ([id number]) [id number] Page 7 of 9 Wound Measurements Length: (cm) 2 Width: (cm) 1 Depth: (cm) 0.5 Area: (cm) 1.571 Volume: (cm) 0.785 % Reduction in Area: 95.2% % Reduction in Volume: 99.5% Epithelialization: Small (1-33%) Undermining: Yes Starting Position (o'clock): 10 Ending Position (o'clock): 7 Maximum Distance: (cm) 1 Wound Description Classification: Category/Stage IV Wound Margin: Distinct, outline attached Exudate Amount: Medium Exudate Type: Serosanguineous Exudate Color: red, brown

## 2022-12-11 ENCOUNTER — Ambulatory Visit (HOSPITAL_BASED_OUTPATIENT_CLINIC_OR_DEPARTMENT_OTHER): Payer: Medicaid Other | Admitting: General Surgery
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
## 2022-12-14 ENCOUNTER — Other Ambulatory Visit: Payer: Self-pay | Admitting: Family Medicine

## 2022-12-14 DIAGNOSIS — K219 Gastro-esophageal reflux disease without esophagitis: Secondary | ICD-10-CM

## 2022-12-18 ENCOUNTER — Other Ambulatory Visit: Payer: Medicaid Other

## 2022-12-22 ENCOUNTER — Ambulatory Visit (INDEPENDENT_AMBULATORY_CARE_PROVIDER_SITE_OTHER): Payer: Medicaid Other | Admitting: Family Medicine

## 2022-12-22 ENCOUNTER — Encounter: Payer: Self-pay | Admitting: Family Medicine

## 2022-12-22 ENCOUNTER — Telehealth: Payer: Self-pay

## 2022-12-22 ENCOUNTER — Ambulatory Visit: Payer: Medicaid Other | Admitting: Family Medicine

## 2022-12-22 VITALS — BP 123/80 | HR 84 | Temp 98.7°F | Ht 68.0 in

## 2022-12-22 DIAGNOSIS — E039 Hypothyroidism, unspecified: Secondary | ICD-10-CM

## 2022-12-22 DIAGNOSIS — S34101D Unspecified injury to L1 level of lumbar spinal cord, subsequent encounter: Secondary | ICD-10-CM | POA: Diagnosis not present

## 2022-12-22 DIAGNOSIS — Z79899 Other long term (current) drug therapy: Secondary | ICD-10-CM | POA: Diagnosis not present

## 2022-12-22 DIAGNOSIS — Z711 Person with feared health complaint in whom no diagnosis is made: Secondary | ICD-10-CM

## 2022-12-22 DIAGNOSIS — G8929 Other chronic pain: Secondary | ICD-10-CM

## 2022-12-22 DIAGNOSIS — D72829 Elevated white blood cell count, unspecified: Secondary | ICD-10-CM | POA: Diagnosis not present

## 2022-12-22 DIAGNOSIS — T1490XS Injury, unspecified, sequela: Secondary | ICD-10-CM | POA: Diagnosis not present

## 2022-12-22 MED ORDER — OXYCODONE HCL 10 MG PO TABS
10.0000 mg | ORAL_TABLET | Freq: Three times a day (TID) | ORAL | 0 refills | Status: DC
Start: 2022-12-31 — End: 2023-03-25

## 2022-12-22 MED ORDER — OXYCODONE HCL 10 MG PO TABS: ORAL_TABLET | ORAL | 0 refills | Status: DC

## 2022-12-22 NOTE — Telephone Encounter (Signed)
Pt should be on the 30 min apt list , all apts need to be 30 mins   Please fix apt for today at 10:45   Thank you!

## 2022-12-22 NOTE — Telephone Encounter (Signed)
Apt for today can be left as it is , please just add her to the 30 min list

## 2022-12-22 NOTE — Telephone Encounter (Signed)
Added
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
## 2022-12-23 LAB — CBC WITH DIFFERENTIAL/PLATELET
Basophils Absolute: 0.1 10*3/uL (ref 0.0–0.2)
Basos: 1 %
EOS (ABSOLUTE): 0.3 10*3/uL (ref 0.0–0.4)
Eos: 4 %
Hematocrit: 42.4 % (ref 34.0–46.6)
Hemoglobin: 13.1 g/dL (ref 11.1–15.9)
Immature Grans (Abs): 0 10*3/uL (ref 0.0–0.1)
Immature Granulocytes: 0 %
Lymphocytes Absolute: 2.8 10*3/uL (ref 0.7–3.1)
Lymphs: 32 %
MCH: 26.8 pg (ref 26.6–33.0)
MCHC: 30.9 g/dL — ABNORMAL LOW (ref 31.5–35.7)
MCV: 87 fL (ref 79–97)
Monocytes Absolute: 0.5 10*3/uL (ref 0.1–0.9)
Monocytes: 6 %
Neutrophils Absolute: 5 10*3/uL (ref 1.4–7.0)
Neutrophils: 57 %
Platelets: 434 10*3/uL (ref 150–450)
RBC: 4.89 x10E6/uL (ref 3.77–5.28)
RDW: 14.3 % (ref 11.7–15.4)
WBC: 8.7 10*3/uL (ref 3.4–10.8)

## 2022-12-23 LAB — T4, FREE: Free T4: 1.2 ng/dL (ref 0.82–1.77)

## 2022-12-23 LAB — PARASITE ID, WORM

## 2022-12-23 LAB — TSH: TSH: 1.9 u[IU]/mL (ref 0.450–4.500)

## 2022-12-24 ENCOUNTER — Telehealth: Payer: Self-pay | Admitting: Family Medicine

## 2022-12-24 NOTE — Telephone Encounter (Signed)
Spoke to [place]. Confirmed negative parasite tests x3

## 2022-12-25 ENCOUNTER — Encounter: Payer: Self-pay | Admitting: Obstetrics and Gynecology

## 2022-12-25 ENCOUNTER — Other Ambulatory Visit (HOSPITAL_COMMUNITY)
Admission: RE | Admit: 2022-12-25 | Discharge: 2022-12-25 | Disposition: A | Discharge status: Home / Self Care | Payer: Medicaid Other | Source: Ambulatory Visit | Attending: Obstetrics and Gynecology | Admitting: Obstetrics and Gynecology

## 2022-12-25 ENCOUNTER — Other Ambulatory Visit: Payer: Self-pay

## 2022-12-25 ENCOUNTER — Encounter: Payer: Medicaid Other | Admitting: Obstetrics and Gynecology

## 2022-12-25 ENCOUNTER — Ambulatory Visit: Payer: Medicaid Other | Admitting: Obstetrics and Gynecology

## 2022-12-25 VITALS — BP 134/56 | HR 87

## 2022-12-25 DIAGNOSIS — B9689 Other specified bacterial agents as the cause of diseases classified elsewhere: Secondary | ICD-10-CM | POA: Insufficient documentation

## 2022-12-25 DIAGNOSIS — Z1331 Encounter for screening for depression: Secondary | ICD-10-CM | POA: Diagnosis not present

## 2022-12-25 DIAGNOSIS — N760 Acute vaginitis: Secondary | ICD-10-CM

## 2022-12-25 LAB — TOXASSURE SELECT 13 (MW), URINE
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
## 2022-12-29 ENCOUNTER — Telehealth: Payer: Self-pay | Admitting: Family Medicine

## 2022-12-29 DIAGNOSIS — Z711 Person with feared health complaint in whom no diagnosis is made: Secondary | ICD-10-CM

## 2022-12-29 DIAGNOSIS — T1490XS Injury, unspecified, sequela: Secondary | ICD-10-CM

## 2022-12-29 LAB — CERVICOVAGINAL ANCILLARY ONLY
Bacterial Vaginitis (gardnerella): NEGATIVE
Candida Glabrata: NEGATIVE
Candida Vaginitis: NEGATIVE
Chlamydia: NEGATIVE
Comment: NEGATIVE
Comment: NEGATIVE
Comment: NORMAL
Comment: NEGATIVE
Comment: NEGATIVE
Comment: NEGATIVE
Neisseria Gonorrhea: NEGATIVE
Trichomonas: NEGATIVE

## 2022-12-29 NOTE — Addendum Note (Signed)
Addended by: Raliegh Ip on: 12/29/2022 04:14 PM   Modules accepted: Orders

## 2022-12-29 NOTE — Telephone Encounter (Signed)
Caretaker said it does not matter where

## 2022-12-29 NOTE — Telephone Encounter (Signed)
Pt caregiver says that Dr Nadine Counts was going to refer pt to see Laurette Schimke but doesn't see referral in pts mychart. Wants update on status of this. Please advise.

## 2022-12-29 NOTE — Telephone Encounter (Signed)
She never confirmed she wanted referral. She said she was going to wait on the sample result and see the infectious doctor.  If she still wants GI, where does she want referral to go to?

## 2023-01-08 ENCOUNTER — Ambulatory Visit: Payer: Medicaid Other | Admitting: Physician Assistant

## 2023-01-14 ENCOUNTER — Ambulatory Visit (HOSPITAL_BASED_OUTPATIENT_CLINIC_OR_DEPARTMENT_OTHER): Payer: Medicaid Other | Admitting: General Surgery

## 2023-01-19 ENCOUNTER — Encounter (HOSPITAL_BASED_OUTPATIENT_CLINIC_OR_DEPARTMENT_OTHER): Payer: Medicaid Other | Attending: General Surgery | Admitting: General Surgery

## 2023-01-19 ENCOUNTER — Encounter: Payer: Self-pay | Admitting: Family Medicine

## 2023-01-19 DIAGNOSIS — L97512 Non-pressure chronic ulcer of other part of right foot with fat layer exposed: Secondary | ICD-10-CM | POA: Diagnosis not present

## 2023-01-19 DIAGNOSIS — L89154 Pressure ulcer of sacral region, stage 4: Secondary | ICD-10-CM | POA: Diagnosis not present

## 2023-01-19 DIAGNOSIS — G8222 Paraplegia, incomplete: Secondary | ICD-10-CM | POA: Diagnosis not present

## 2023-01-19 DIAGNOSIS — L97521 Non-pressure chronic ulcer of 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: Secondary | ICD-10-CM | POA: Diagnosis present

## 2023-01-19 DIAGNOSIS — F17200 Nicotine dependence, unspecified, uncomplicated: Secondary | ICD-10-CM | POA: Insufficient documentation

## 2023-01-19 NOTE — Progress Notes (Signed)
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 placed the wound VAC. She is now receiving IV antibiotics at the direction of infectious disease which I think is ertapenem for 20 days. 03/09/2019 on evaluation today patient appears to be doing quite well with regard to her wound. Its been since 01/17/2019 when we last saw her. She subsequently ended up in the hospital due to a baclofen pump which became infected. She has been on antibiotics as prescribed by infectious disease for this and she was seeing Dr. Luciana Axe at [facility] for infectious disease. Subsequently she has been on doxycycline through November 29. The baclofen pump was placed on 12/22/2018 by Dr. Norville Haggard. Unfortunately the patient developed a wound infection and underwent debridement by Dr. Franky Macho on 01/08/2019. The growth in  the culture revealed ESBL E. coli and diphtheroids and the patient was initially placed on ertapenem him in doxycycline for 3 weeks. Subsequently she comes in today for reevaluation and it does appear that her wound is actually doing significantly better even compared to last time we saw her. This is in regard to the medial sacral region. 2/5; I have not seen this wound in almost 3 months. Certainly has gotten better in terms of surface area although there is significant direct depth. She has completed antibiotics for the underlying osteomyelitis. 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 She has been using silver alginate. Her mother is changing the dressing. She claims to be offloading this area except for when she gets up to go to doctors appointments 3/12; this is a punched-out wound on the lower sacrum. Has a depth of about 1.8  cm. It does not appear to undermine. There is no palpable bone. We have been using silver alginate packing her mother is changing the dressing she claims to be offloading this. She had a baclofen pump placed to alleviate her spasticity 5/17; the patient has not been seen in over 2 months. 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 Apparently they started to notice an odor and drainage earlier this month. Patient was admitted to [facility] from 5/3 through 07/12/2019. We do not have any records here. Apparently she was found to have pneumonia, a UTI. She also went underwent a surgical debridement of her lower sacral wound. She comes in today with a really large postoperative wound. This does not have exposed bone but very close. This is right back to where this was a year or 2 ago. They have been using wet-to-dry. She is on an IV antibiotic but is not sure what drugs. We are not sure what home health company they are currently using. We are trying to get  records from [facility]. 6/8; this the patient return to clinic 3 weeks ago. She had surgical debridement of the lower sacral wound. She apparently is completed her IV antibiotics although I am not exactly sure what IV antibiotics she had ordered. Her PICC line is come out. Her wound is large but generally clean there still is exposed bone. Fortunately the area on the right heel is callused over I cannot prove there is an open area here per 6/22; they are using a wet to dry dressing when we left the clinic last time however they managed to find a box of silver alginate so they are using that with backing gauze. They are still changing this twice a day because of drainage issues. She has Medicaid and they will not be able to replace the want dressing she will have to go back to a wet-to-dry. In spite of this the wound actually looks quite good some improvement in dimension 7/13; using silver alginate. Slight improvement in overall wound volume including depth although there is still undermining. She tells me she is offloading this is much as possible 8/10-Patient back at 1 month, continuing to use silver alginate although she has run out and therefore using saline wet-to-dry, undermining is minimal, continuing attempts at offloading according to patient 9/21; its been about 9 weeks since I have seen this patient although I note she was seen once in August. Her wounds on the lower sacrum this looks a lot better than the last time I saw this. She is using silver alginate to the wound bed. They only have Medicaid therefore they have to need to pay out-of-pocket for the dressing supplies. This certainly limits options. 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 10/19; 1 month follow-up. Not much change in the wound in fact it is measuring slightly larger. Still using silver alginate which her mother is purchasing off [employer] I  believe. Since she was here she had a suprapubic catheter placed because of a lacerated urethra. 11/30; patient has not been here in about 6 weeks. She apparently has had 3 admissions to the hospital for pneumonia in the last 7 months 2 in the last 2 months. She came out of hospital this time with 4 L of oxygen. Her mother is using the Anasept wet-to-dry to the sacral wound  and surprisingly this looks somewhat better. The patient states she is pending 24/7 in bed except for going to doctor's appointments this may be helping. She has an appointment with pulmonology on 12/17. She was a heavy smoker for a period of time I wonder whether she has COPD 12/28; patient is doing well she is off her oxygen which may have something to do with chronic Macrobid she was on [hypersensitivity pneumonitis]. She is also stop smoking. In any case she is doing well from a breathing point of view. She is using Anasept wet-to-dry. 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 She has been using Anasept gel wet-to-dry and really has done a nice job here. She does not have insurance for other wound care products but truthfully so far this is done a really good job. I note she is back on oxygen. 2/22; stage IV wound on her lower sacrum. They have been to using an Anasept gel wet-to-dry-based dressing. Ordinarily I would like to use a moistened collagen wet-to-dry but she is apparently not able to afford this. There was also some suggestion about using Dakin's but apparently her mother could not make 3/14; 3-week follow-up. She is going for bladder surgery at [facility] next week. Still using Anasept gel wet-to-dry. We will try to get Prisma wet to dry through [place], Elzora A ([id number]) [phone].pdf Page 6 of 19 what I think is [employer]. This probably will not work 4/19; 4-week follow-up. She is using collagen with wet-to-dry  dressings every day. She reports improvement to the wound healing. Patient had bladder neck surgery with suprapubic catheter placement on 3/22. On 3/30 she was sent to the emergency department because of hypotension. She was found to be in septic shock in the setting of ESBL E. coli UTI. Currently she is doing well. 5/17; 4-week follow-up. She is using collagen with backing wet-to-dry. Really nice improvement in surface area of these wounds depth at 1.2 cm. She has had problems with urinary leakage. She does have a suprapubic catheter 6/14; 4-week follow-up. She was doing really well the last time we saw her in the wound had filled in quite a bit. However since that time her wound is gotten a lot deeper. Apparently her bladder surgery failed and she is incontinent a lot of the time. Furthermore her mother suffered a stroke and is staying with her sister. She now has care attendance but I am not really certain about the amount of care she has. We have been using silver collagen but apparently the family has to buy this privately. 6/28; the wound measures slightly larger I certainly do not see the difference. It has 1.5 cm of direct depth but not exposed to bone it does not appear to be infected. We had her using silver alginate although she does not seem to know what her mother's been dressing this with recently 7/12; 2-week follow-up. 1.7 mm of direct depth this is fluctuated a fair amount but I do not see any consistent trend towards closure. The tissue looks healthy minimal undermining no palpable bone. No evidence of surrounding infection. We have been using silver alginate wet-to-dry although the patient wants to go back to Anasept gel wet to dry 8/30; we have seen this patient in quite a while however her wound has come in nicely at roughly 0.8 or 0.9 mm of direct depth also down in length and width. She is using Anasept  wet-to-dry her mother is changing the dressing 9/27; 1 month follow-up.  Since she is being here last she apparently has had an attempt to close her urethra by urology in [facility] this was temporarily successful but now is reopened.. We have been using Anasept wet-to-dry. Her variant of Medicaid will not pay for wound care supplies. Most of what the use is being paid for out of their own pocket 10/25; 1 month follow-up. Her wound is measuring better she is still using Anasept wet-to-dry. Her mother changes this twice a day because of drainage. Overall the wound dimensions are better. The patient states that her recent urologic procedure actually resulted in less incontinence which may be helping Apparently had her mattress overlay on her bed is disintegrating. She asked if I be willing to put in an order for replacement I told her we would try her best although I am not exactly sure how long Medicaid will allow for replacement 11/29; 1 month follow-up. She arrives with her mother who is her primary caregiver. There is still using Anasept wet-to-dry but her mother says because of drainage they are changing this 3 times a day. Dimensions are a little worse or as last time she was actually a little better 12/13; the patient's wound had deteriorated last time. I did a culture of the wound that showed of few rare Pseudomonas and a few rare Corynebacterium. The Corynebacterium is likely a skin contaminant. I did prescribe topical gentamicin under the silver alginate directed at the Pseudomonas. Her mother says that there is a lot of drainage she changes the odor gauze packing 3 times a day currently using 6 gentamicin under silver alginate covering all of this with ABDs 03/13/2021; patient is using silver alginate. Very little change in this wound this actually goes down to bone with a rim of tissue separating environment from underlying sacral bone. There is no real granulation. At the base of this. She has not been systemically unwell. The wound itself not much changed  however it abuts right on bone. She has Medicaid which does not give [place] a lot of options for either home care or different dressings. We thought this over in some detail. We might be able to get a wound VAC through Medicaid but we would not be able to get this changed by home health. She lives in [place] which she would be a far drive to this clinic twice a week. We might be able to teach the daughter or friend how to do this but there is a lot of issues that need to be worked through before we put this into the final ordering status 03/28/2021; the wound actually looks somewhat better contracted. There is still some undermining but no evidence of infection. We have been using gentamicin and silver alginate. 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 She is apparently going for some form of tendon release next week by orthopedics. Her knees are fixed in extension right leg first apparently 2/16; the wound looks clean not much change in dimensions. She has undermining at roughly 4:00. There is no exposed bone. We are using silver alginate with underlying gentamicin. The last culture I did of this in December showed Pseudomonas which is the reason for the gentamicin topical Since patient was last here she had quadricep tendon release surgery at [facility] 05/09/2021: There has been improvement overall in the wound. The base is fairly clean with minimal  slough. The size has contracted.[name] Unfortunately, one of her wounds from her quadricep tendon release is open and there is extensive nonviable tissue. She reports that she is going to have to have this operatively debrided. 06/06/2021: Since her last visit in clinic, she underwent debridement of the open wound from her quadricep tendon release with placement of Kerecis and primary closure. Her sutures have been removed and she saw her surgeon last week. In the interim, a clip from her suprapubic catheter  caused a new wound to occur on her right thigh and her old left heel pressure ulcer has reopened. Her sacral wound is smaller. None of the wounds appears infected, but there is epibole around the sacral wound and fairly thick slough on the heel wound. 06/20/2021: The heel ulcer is clean with good granulation tissue. The sacral wound is smaller but measures a little deeper today. There is heaped up senescent skin around the edges of the sacral wound. The wound on her thigh is a little bit bigger but remains fairly superficial. 07/04/2021: The heel ulcer is nearly completely healed. Very little open tissue. The sacral wound continues to contract around the cavity. There is a lot of heaped up senescent tissue around the edges of the wound. The wound on her thigh has not really made much improvement at all. It remains superficial but has a thick layer of yellow slough. No concern for infection in any of the wound sites. 07/18/2021: The heel ulcer has closed. The sacral wound is shallower with a little bit of slough in the wound base. There is less senescent tissue around the perimeter. The wound on her thigh looks like it might be a little deeper in the center. It continues to have a fibrous slough layer. No concern for infection. 07/25/2021: We initiated wound VAC therapy last week. The sacral wound is smaller but still has some undermining. There is just a little bit of slough in the wound base. The wound on her thigh we began treating with Santyl. The fibrinous slough has diminished and there is some granulation tissue beginning to form. 08/01/2021: For some reason, the sacral wound measures deeper today. It is fairly clean and there is no significant odor from the wound, but the patient's mother reports an increase in the drainage. She has struggled with maintaining a good seal with the drape, given the location of the wound. On the other hand, the thigh wound is smaller and shallower. There is just some  fibrinous slough on the surface. 08/09/2021: The patient was hospitalized last week with a urinary tract infection. She is still feeling a bit poorly today. She is currently taking a course of oral antibiotics. The sacral wound is a little bit smaller today. It does have a layer of slough on the surface. The wound on her thigh continues to contract and is fairly superficial today. There is fibrinous slough at the site, as well. 08/23/2021: The patient was hospitalized again this last week with sepsis. It sounds like urology is planning to perform a cystectomy with ileal conduit to help with her urinary leakage issues. She apparently developed a cutaneous fungal infection where her wound VAC drape was and so that has been removed and her mom is doing wet-to-dry dressing changes. The periwound skin is now healing, but it is still fairly red and irritated. She has a deep tissue injury on the same heel that we had previously treated for an open ulcer, but there is currently no skin opening. The wound  on her thigh is very superficial and clean without any slough accumulation. 08/30/2021: The heel looks good today and has not opened. The wound on her thigh is closed. The sacral wound looks better, particularly the periwound skin. Her mother has been applying a mixture of Desitin and miconazole to the periwound and just doing wet-to-dry dressings because we did not want to further irritate the skin with the wound VAC drape. The wound is clean with just a little bit of superficial slough and senescent skin heaped up around the margin. 09/05/2021: The periwound skin at her sacral ulcer site is markedly improved. Immediately surrounding the wound orifice, there is some thick, dried, and scaly skin. There is also some slough buildup on the wound surface. No odor or significant drainage. SHANVIKA, DICKERSON A ([id number]) [id number] Page 7 of 19 7/14; the patient still has the same depth  however surrounding granulation looks better. Using wound VAC with topical Keystone antibiotics. She may be eligible for an improved mattress cover and we will look into it 10/11/2021: The wound is a little bit deeper today. She is struggling with urinary contamination of her sacral wound and has subsequently developed maceration and some tissue breakdown. No obvious external infection. There is good granulation tissue on the surface with just a little bit of light slough. 10/25/2021: The wound looks better today. The depth has come in by over a centimeter. Her skin is in better condition. There is heaped up senescent skin around the wound perimeter. She received her level 3 bed and is very happy with it. 11/08/2021: The undermining has come in substantially. The overall wound depth is about the same. She continues to build up senescent skin around the wound perimeter. 11/22/2021: The wound apparently measured a little bit deeper, but it no longer undermines much at all. It is clean. There is senescent skin accumulation around the perimeter, as usual. 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 This was complicated by sepsis secondary to pneumonia. Her wound actually looks quite good. The size is about the same, but the depth has come in considerably and there is less undermining. It is a little bit dry around the edges. No concern for infection. 03/17/2022: The patient has been in and out of the hospital since her last visit, primarily with episodes of sepsis related to urinary infections. They have been having issues with her urostomy bags leaking having issues and the patient's mother is concerned that some of the contaminated urine may be reaching her sacral ulcer, which has enlarged since the last time we saw her. On inspection, the wound is a little larger, but it is very clean with just some  slough and accumulation of senescent skin around the margins. 04/16/2022: No real change to the sacral ulcer. It is clean without any significant slough or other debris accumulation. No concern for infection. 05/22/2022: The patient has been in the hospital and she was not on an appropriate surface for a time. She has also had some leakage from her urostomy due to the hospital not having the correct bags. Finally, her air-fluidized mattress has been leaking and was only just repaired. As result of these issues, her sacral ulcer is a little bit bigger, but it remains quite clean with good granulation tissue at the base. 06/19/2022: Her wound is smaller and shallower this week. There is a little bit of slough on the surface and some senescent skin  around the edges. 07/17/2022: The wound has contracted further and is shallower again today. There is light slough on the wound surface and reaccumulation of the senescent skin around the edges. 08/14/2022: Although the wound measurements have not changed, the cavity has actually contracted quite a bit. There is some slough on the surface and reaccumulation of senescent skin around the wound margins. 09/11/2022: The external wound measurement is smaller but the depth is actually measuring a little bit deeper today. She has senescent skin that has reaccumulated around the orifice. 10/16/2022: She has a new wound on her right great toe. She apparently developed a pustular skin rash and had multiple draining sites; this area is the only remaining wound from that episode. The sacral ulcer is without any undermining, but did measure slightly deeper. She was in the hospital with pneumonia for about a week. 11/13/2022: The great toe wound is quite small and has a little bit of slough and eschar on the surface. The sacral ulcer is shallower and narrower. She has not accumulated the senescent skin around the edges and there is no discernible slough on the  surface. 12/10/2022: The great toe wound is basically the same. The patient's mother said that it had closed but she apparently bumped or rubbed her toe against something and it reopened a bit. The sacral ulcer is the smallest I think I have seen it in ages. There is some senescent skin around the edges and minimal slough on the surface. 01/19/2023: The great toe wound is nearly healed with just a small open portion remaining at the tip. She has rubbed a new wound open on her opposite great toe; this appears to be secondary to friction potentially on her bed. The sacral ulcer got a little bit bigger since her last visit, with some slough and senescent skin reaccumulation. The change in size appears likely secondary to moisture. Electronic Signature(s) Signed: 01/19/2023 10:44:50 AM By: Duanne Guess MD FACS Entered By: Duanne Guess on 01/19/2023 07:44:50 -------------------------------------------------------------------------------- Physical Exam Details Patient Name: Date of Service: [name] 01/19/2023 9:15 A M Medical Record Number: [id number] Patient Account Number: [id number] Date of Birth/Sex: Treating RN: [birth year] (42 y.o. F) Primary Care Provider: Delynn Flavin Other Clinician: Referring Provider: Treating Provider/Extender: Tomasa Hosteller, Kathie Rhodes Weeks in Treatment: 342 Constitutional . . . . no acute distress. Respiratory Normal work of breathing on room air.[name] PAXTEN, POGOSYAN A ([id number]) [id number] Page 8 of 19 Notes 01/19/2023: The great toe wound is nearly healed with just a small open portion remaining at the tip. She has rubbed a new wound open on her opposite great toe; this appears to be secondary to friction potentially on her bed. The sacral ulcer got a little bit bigger since her last visit, with some slough and senescent skin reaccumulation. The change in size appears likely secondary to  moisture. Electronic Signature(s) Signed: 01/19/2023 10:45:25 AM By: Duanne Guess MD FACS Entered By: Duanne Guess on 01/19/2023 07:45:25 -------------------------------------------------------------------------------- Physician Orders Details Patient Name: Date of Service: [name] 01/19/2023 9:15 A M Medical Record Number: [id number] Patient Account Number: [id number] Date of Birth/Sex: Treating RN: [birth year] (42 y.o. 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 - Dr. Lady Gary Room 1 ***HOYER*** Anesthetic (In clinic) Topical Lidocaine 4% applied to wound bed Bathing/ Shower/ Hygiene May shower and wash wound with soap and water. Off-Loading Low air-loss mattress (Group 2) - pt may go back to group 2 surface for comfort if pt desires A fluidized (Group 3) mattress - medical modalities ir Turn and reposition every 2 hours Wound Treatment Wound #1 - Sacrum Wound Laterality: Medial Cleanser: Soap and Water 1 x Per Day/30 Days Discharge Instructions: May shower and wash wound with dial antibacterial soap and water prior to dressing change. Cleanser: Anasept Antimicrobial Skin and Wound Cleanser, 8 (oz) 1 x Per Day/30 Days Discharge Instructions: Cleanse the wound with Anasept cleanser prior to applying a clean dressing using gauze sponges, not tissue or cotton balls. Peri-Wound Care: Zinc Oxide Ointment 30g tube 1 x Per Day/30 Days Discharge  Instructions: Apply Zinc Oxide to periwound as needed for moisture Prim Dressing: Maxorb Extra Ag+ Alginate Dressing, 2x2 (in/in) 1 x Per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

## 2023-01-19 NOTE — Progress Notes (Signed)
KHYLI, WEYER [name] ([id number]) [id number] Page 1 of 10 Visit Report for 01/19/2023 Arrival Information Details Patient Name: Date of Service: [name], [name] 01/19/2023 9:15 [name] M Medical Record Number: [id number] Patient Account Number: [id number] Date of Birth/Sex: Treating RN: 07/24/1980 (42 y.o. [name] Primary Care [name]: [name] Other Clinician: Referring [name]: Treating [name]/Extender: [name] in Treatment: 342 Visit Information History Since Last Visit Added or deleted any medications: No Patient Arrived: Wheel Chair Any new allergies or adverse reactions: No Arrival Time: 10:02 Had [name] fall or experienced change in No Accompanied By: MOTHER activities of daily living that may affect Transfer Assistance: [name] Lift risk of falls: Patient Identification Verified: Yes Signs or symptoms of abuse/neglect since last visito No Secondary Verification Process Completed: Yes Hospitalized since last visit: No Patient Requires Transmission-Based Precautions: No Implantable device outside of the clinic excluding No Patient Has Alerts: No cellular tissue based products placed in the center since last visit: Has Dressing in Place as Prescribed: Yes Pain Present Now: Yes Electronic Signature(s) Signed: 01/19/2023 4:36:03 PM By: Jamie Pulling RN, BSN Entered By: [name] on 01/19/2023 07:11:49 -------------------------------------------------------------------------------- Encounter Discharge Information Details Patient Name: Date of Service: Jamie Rink [name]. 01/19/2023 9:15 [name] M Medical Record Number: [id number] Patient Account Number: [id number] Date of Birth/Sex: Treating RN: 17-Dec-1980 (42 y.o. [name] Primary Care Nikolina Simerson: [name] Other Clinician: Referring [name]: Treating [name]/Extender: [name] in Treatment:  [phone] Encounter Discharge Information Items Post Procedure Vitals Discharge Condition: Stable Temperature (F): 98.0 Ambulatory Status: Wheelchair Pulse (bpm): 90 Discharge Destination: Home Respiratory Rate (breaths/min): 18 Transportation: Private Auto Blood Pressure (mmHg): 116/84 Accompanied By: mother Schedule Follow-up Appointment: Yes Clinical Summary of Care: Patient Declined Electronic Signature(s) Signed: 01/19/2023 4:36:03 PM By: Jamie Pulling RN, BSN Entered By: [name] on 01/19/2023 01:02:72 [name] ([id number]) [id number] Page 2 of 10 -------------------------------------------------------------------------------- Lower Extremity Assessment Details Patient Name: Date of Service: [name], [name] 01/19/2023 9:15 [name] M Medical Record Number: [id number] Patient Account Number: [id number] Date of Birth/Sex: Treating RN: 02-Jan-1981 (42 y.o. [name] Primary Care [name]: [name] Other Clinician: Referring [name]: Treating [name]/Extender: [name] in Treatment: 342 Electronic Signature(s) Signed: 01/19/2023 4:36:03 PM By: Jamie Pulling RN, BSN Entered By: [name] on 01/19/2023 07:15:08 -------------------------------------------------------------------------------- Multi Wound Chart Details Patient Name: Date of Service: Jamie Rink [name]. 01/19/2023 9:15 [name] M Medical Record Number: [id number] Patient Account Number: [id number] Date of Birth/Sex: Treating RN: January 02, 1981 (42 y.o. F) Primary Care [name]: [name] Other Clinician: Referring [name]: Treating [name]/Extender: [name] in Treatment: 342 Vital Signs Height(in): 65 Pulse(bpm): 90 Weight(lbs): 201 Blood Pressure(mmHg): 116/84 Body Mass Index(BMI): 33.4 Temperature(F): 98.8 Respiratory Rate(breaths/min): 18 [1:Photos:] [19:No Photos] Medial Sacrum  Right T Great oe Left T Great oe Wound Location: Pressure Injury Blister Shear/Friction Wounding Event: Pressure Ulcer Infection - not elsewhere classified Abrasion Primary Etiology: Anemia, Osteomyelitis Anemia, Osteomyelitis Anemia, Osteomyelitis Comorbid History: 05/15/2016 09/14/2022 01/19/2023 Date Acquired: 342 13 0 [name] of Treatment: Open Open Open Wound Status: No No No Wound Recurrence: 2.9x1.2x1.1 0.1x0.1x0.1 0.3x0.3x0.1 Measurements L x W x D (cm) 2.733 0.008 0.071 [name] (cm) : rea 3.007 0.001 0.007 Volume (cm) : 91.70% 96.60% N/[name] % Reduction in [name] rea: 97.90% 95.80% N/[name] % 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 Full Thickness Without Exposed Full Thickness Without Exposed Classification: Support Structures Support Structures Medium None Present Medium Exudate Amount: Serosanguineous N/[name] Serosanguineous Exudate  Type: red, brown N/[name] red, brown Exudate Color: Distinct, outline attached Flat and Intact N/[name] Wound Margin: Large (67-100%) None Present (0%) Small (1-33%) Granulation Amount: Pink N/[name] Red Granulation Quality: Small (1-33%) Large (67-100%) Large (67-100%) Necrotic Amount: [name], [name] [name] ([id number]) [id number] Page 3 of 10 Adherent Slough Eschar Eschar, Adherent Slough Necrotic Tissue: Fat Layer (Subcutaneous Tissue): Yes Fascia: No Fat Layer (Subcutaneous Tissue): Yes Exposed Structures: Fascia: No Fat Layer (Subcutaneous Tissue): No Fascia: No Tendon: No Tendon: No Tendon: No Muscle: No Muscle: No Muscle: No Joint: No Joint: No Joint: No Bone: No Bone: No Bone: No Small (1-33%) Large (67-100%) None Epithelialization: Debridement - Excisional Debridement - Excisional Debridement - Excisional Debridement: Pre-procedure Verification/Time Out 10:27 10:27 10:27 Taken: Lidocaine 4% Topical Solution Lidocaine 4% Topical Solution Lidocaine 4% Topical Solution Pain Control: Necrotic/Eschar, Subcutaneous,  Necrotic/Eschar, Subcutaneous, Necrotic/Eschar, Subcutaneous, Tissue Debrided: [employer] Skin/Subcutaneous Tissue Skin/Subcutaneous Tissue Skin/Subcutaneous Tissue Level: 2.73 0.07 0.07 Debridement [name] (sq cm): rea Curette Curette Curette Instrument: Minimum Minimum Minimum Bleeding: Pressure Pressure Pressure Hemostasis Achieved: Debridement Treatment Response: Procedure was tolerated well Procedure was tolerated well Procedure was tolerated well Post Debridement Measurements L x 2.9x1.2x1.1 0.3x0.3x0.1 0.3x0.3x0.1 W x D (cm) 3.007 0.007 0.007 Post Debridement Volume: (cm) Category/Stage IV N/[name] N/[name] Post Debridement Stage: Rash: No No Abnormalities Noted Periwound Skin Texture: Maceration: Yes No Abnormalities Noted Periwound Skin Moisture: Dry/Scaly: No No Abnormalities Noted No Abnormalities Noted Periwound Skin Color: No Abnormality No Abnormality N/[name] Temperature: Debridement Debridement Debridement Procedures Performed: Treatment Notes Electronic Signature(s) Signed: 01/19/2023 10:43:25 AM By: Duanne Guess MD FACS Entered By: Duanne Guess on 01/19/2023 07:43:25 -------------------------------------------------------------------------------- Multi-Disciplinary Care Plan Details Patient Name: Date of Service: Jamie Rink [name]. 01/19/2023 9:15 [name] M Medical Record Number: [id number] Patient Account Number: [id number] Date of Birth/Sex: Treating RN: Dec 18, 1980 (42 y.o. [name] Primary Care Arvel Oquinn: [name] Other Clinician: Referring Arion Shankles: Treating Cobi Aldape/Extender: [name] in Treatment: 342 Multidisciplinary Care Plan reviewed with physician Active Inactive Pressure Nursing Diagnoses: Knowledge deficit related to management of pressures ulcers Potential for impaired tissue integrity related to pressure, friction, moisture, and shear Goals: Patient will remain free from development of  additional pressure ulcers Date Initiated: 06/27/2016 Date Inactivated: 01/17/2019 Target Resolution Date: 12/24/2018 Goal Status: Met Patient/caregiver will verbalize risk factors for pressure ulcer development Date Initiated: 06/27/2016 Date Inactivated: 07/08/2017 Target Resolution Date: 07/08/2017 Goal Status: Met Patient/caregiver will verbalize understanding of pressure ulcer management Date Initiated: 06/27/2016 Target Resolution Date: 01/30/2023 Goal Status: Active Interventions: Assess: immobility, friction, shearing, incontinence upon admission and as needed DESERAY, CORSON [name] ([id number]) [id number] Page 4 of 10 Assess offloading mechanisms upon admission and as needed Assess potential for pressure ulcer upon admission and as needed Provide education on pressure ulcers Treatment Activities: Patient referred for pressure reduction/relief devices : 06/27/2016 Pressure reduction/relief device ordered : 06/27/2016 Notes: Wound/Skin Impairment Nursing Diagnoses: Impaired tissue integrity Knowledge deficit related to smoking impact on wound healing Knowledge deficit related to ulceration/compromised skin integrity Goals: Patient will demonstrate [name] reduced rate of smoking or cessation of smoking Date Initiated: 06/27/2016 Date Inactivated: 01/26/2017 Target Resolution Date: 01/08/2017 Goal Status: Unmet Unmet Reason: pt continues to smoke Patient/caregiver will verbalize understanding of skin care regimen Date Initiated: 06/27/2016 Target Resolution Date: 01/30/2023 Goal Status: Active Ulcer/skin breakdown will have [name] volume reduction of 50% by week 8 Date Initiated: 06/27/2016 Date Inactivated: 12/11/2016 Target Resolution Date: 07/25/2016 Goal Status: Met Interventions: Assess patient/caregiver ability to obtain necessary supplies Assess patient/caregiver ability  to perform ulcer/skin care regimen upon admission and as needed Assess ulceration(s)  every visit Provide education on smoking Provide education on ulcer and skin care Treatment Activities: Skin care regimen initiated : 06/27/2016 Topical wound management initiated : 06/27/2016 Notes: Electronic Signature(s) Signed: 01/19/2023 4:36:03 PM By: Jamie Pulling RN, BSN Entered By: [name] on 01/19/2023 07:26:08 -------------------------------------------------------------------------------- Pain Assessment Details Patient Name: Date of Service: Jamie Rink [name]. 01/19/2023 9:15 [name] M Medical Record Number: [id number] Patient Account Number: [id number] Date of Birth/Sex: Treating RN: [birth year] (42 y.o. [name] Primary Care Alexio Sroka: [name] Other Clinician: Referring Gussie Murton: Treating Inella Kuwahara/Extender: [name] in Treatment: 342 Active Problems Location of Pain Severity and Description of Pain Patient Has Paino Yes Site Locations Rate the pain. [name], [name] [name] ([id number]) [id number] Page 5 of 10 Rate the pain. Current Pain Level: 9 Pain Management and Medication Current Pain Management: Electronic Signature(s) Signed: 01/19/2023 4:36:03 PM By: Jamie Pulling RN, BSN Entered By: [name] on 01/19/2023 07:12:24 -------------------------------------------------------------------------------- Patient/Caregiver Education Details Patient Name: Date of Service: [name] 11/18/2024andnbsp9:15 [name] M Medical Record Number: [id number] Patient Account Number: [id number] Date of Birth/Gender: Treating RN: 12/07/1980 (42 y.o. [name] Primary Care Physician: [name] Other Clinician: Referring Physician: Treating Physician/Extender: [name] in Treatment: 342 Education Assessment Education Provided To: Patient Education Topics Provided Wound/Skin Impairment: Methods: Explain/Verbal Responses: State content  correctly Electronic Signature(s) Signed: 01/19/2023 4:36:03 PM By: Jamie Pulling RN, BSN Entered By: [name] on 01/19/2023 32:95:18 -------------------------------------------------------------------------------- Wound Assessment Details Patient Name: Date of Service: Jamie Rink [name]. 01/19/2023 9:15 [name] M Medical Record Number: [id number] Patient Account Number: [id number] Date of Birth/Sex: Treating RN: 11-Apr-1980 (42 y.o. [name] Primary Care Findlay Dagher: [name] Other Clinician: Referring Jazyiah Yiu: Treating Zabrina Brotherton/Extender: Jamie Morin [place], Juleen [name] ([id number]) [id number] Page 6 of 10 [name] in Treatment: 342 Wound Status Wound Number: 1 Primary Etiology: Pressure Ulcer Wound Location: Medial Sacrum Wound Status: Open Wounding Event: Pressure Injury Comorbid History: Anemia, Osteomyelitis Date Acquired: 05/15/2016 [name] Of Treatment: 342 Clustered Wound: No Photos Wound Measurements Length: (cm) 2.9 Width: (cm) 1.2 Depth: (cm) 1.1 Area: (cm) 2.733 Volume: (cm) 3.007 % Reduction in Area: 91.7% % Reduction in Volume: 97.9% Epithelialization: Small (1-33%) Tunneling: No Undermining: No Wound Description Classification: Category/Stage IV Wound Margin: Distinct, outline attached Exudate Amount: Medium Exudate Type: Serosanguineous Exudate Color: red, brown Foul Odor After Cleansing: No Slough/Fibrino No Wound Bed Granulation Amount: Large (67-100%) Exposed Structure Granulation Quality: Pink Fascia Exposed: No Necrotic Amount: Small (1-33%) Fat Layer (Subcutaneous Tissue) Exposed: Yes Necrotic Quality: Adherent Slough Tendon Exposed: No Muscle Exposed: No Joint Exposed: No Bone Exposed: No Periwound Skin Texture Texture Color No Abnormalities Noted: Yes No Abnormalities Noted: Yes Moisture Temperature / Pain No Abnormalities Noted: No Temperature: No Abnormality Dry /  Scaly: No Maceration: Yes Treatment Notes Wound #1 (Sacrum) Wound Laterality: Medial Cleanser Soap and Water Discharge Instruction: May shower and wash wound with dial antibacterial soap and water prior to dressing change. Anasept Antimicrobial Skin and Wound Cleanser, 8 (oz) Discharge Instruction: Cleanse the wound with Anasept cleanser prior to applying [name] clean dressing using gauze sponges, not tissue or cotton balls. Peri-Wound Care Zinc Oxide Ointment 30g tube Discharge Instruction: Apply Zinc Oxide to periwound as needed for moisture Topical Primary Dressing [name], [name] ([id number]) [id number] Page 7 of 10 Maxorb Extra Ag+ Alginate Dressing, 2x2 (in/in) Discharge Instruction: Apply to wound bed as instructed Secondary  Dressing Zetuvit Plus Silicone Border Dressing 5x5 (in/in) Discharge Instruction: Apply silicone border over primary dressing as directed. Secured With Compression Wrap Compression Stockings Facilities manager) Signed: 01/19/2023 4:36:03 PM By: Jamie Pulling RN, BSN Entered By: [name] on 01/19/2023 07:21:09 -------------------------------------------------------------------------------- Wound Assessment Details Patient Name: Date of Service: Jamie Rink [name]. 01/19/2023 9:15 [name] M Medical Record Number: [id number] Patient Account Number: [id number] Date of Birth/Sex: Treating RN: 09-06-1980 (42 y.o. [name] Primary Care Ethel Veronica: [name] Other Clinician: Referring Lashone Stauber: Treating Makalah Asberry/Extender: [name] in Treatment: 342 Wound Status Wound Number: 18 Primary Etiology: Infection - not elsewhere classified Wound Location: Right T Great oe Wound Status: Open Wounding Event: Blister Comorbid History: Anemia, Osteomyelitis Date Acquired: 09/14/2022 [name] Of Treatment: 13 Clustered Wound: No Photos Wound Measurements Length: (cm) 0.1 Width:  (cm) 0.1 Depth: (cm) 0.1 Area: (cm) 0.008 Volume: (cm) 0.001 % Reduction in Area: 96.6% % Reduction in Volume: 95.8% Epithelialization: Large (67-100%) Tunneling: No Undermining: No Wound Description Classification: Full Thickness Without Exposed Support Structures Wound Margin: Flat and Intact Exudate Amount: None Present Foul Odor After Cleansing: No Slough/Fibrino No Wound Bed Granulation Amount: None Present (0%) Exposed Structure Necrotic Amount: Large (67-100%) Fascia Exposed: No Necrotic Quality: Eschar Fat Layer (Subcutaneous Tissue) Exposed: No Tendon Exposed: No [name], [name] ([id number]) [id number] Page 8 of 10 Muscle Exposed: No Joint Exposed: No Bone Exposed: No Periwound Skin Texture Texture Color No Abnormalities Noted: Yes No Abnormalities Noted: Yes Moisture Temperature / Pain No Abnormalities Noted: Yes Temperature: No Abnormality Treatment Notes Wound #18 (Toe Great) Wound Laterality: Right Cleanser Peri-Wound Care Topical Primary Dressing Maxorb Extra Ag+ Alginate Dressing, 2x2 (in/in) Discharge Instruction: Apply to wound bed as instructed Secondary Dressing Woven Gauze Sponges 2x2 in Discharge Instruction: Apply over primary dressing as directed. Secured With Conforming Stretch Gauze Bandage, Sterile 2x75 (in/in) Discharge Instruction: Secure with stretch gauze as directed. Compression Wrap Compression Stockings Add-Ons Electronic Signature(s) Signed: 01/19/2023 4:36:03 PM By: Jamie Pulling RN, BSN Entered By: [name] on 01/19/2023 07:16:57 -------------------------------------------------------------------------------- Wound Assessment Details Patient Name: Date of Service: Jamie Rink [name]. 01/19/2023 9:15 [name] M Medical Record Number: [id number] Patient Account Number: [id number] Date of Birth/Sex: Treating RN: 06/06/1980 (42 y.o. [name] Primary Care Abbie Jablon: [name] Other  Clinician: Referring Teren Franckowiak: Treating Hailie Searight/Extender: [name] in Treatment: 342 Wound Status Wound Number: 19 Primary Etiology: Abrasion Wound Location: Left T Great oe Wound Status: Open Wounding Event: Shear/Friction Comorbid History: Anemia, Osteomyelitis Date Acquired: 01/19/2023 [name] Of Treatment: 0 Clustered Wound: No Wound Measurements Length: (cm) 0.3 Width: (cm) 0.3 Depth: (cm) 0.1 Area: (cm) 0.071 Volume: (cm) 0.007 % Reduction in Area: % Reduction in Volume: Epithelialization: None Tunneling: No Undermining: No Wound Description [name], [name] ([id number]) Classification: Full Thickness Without Exposed Support Structures Exudate Amount: Medium Exudate Type: Serosanguineous Exudate Color: red, brown [id number] Page 9 of 10 Foul Odor After Cleansing: No Slough/Fibrino Yes Wound Bed Granulation Amount: Small (1-33%) Exposed Structure Granulation Quality: Red Fascia Exposed: No Necrotic Amount: Large (67-100%) Fat Layer (Subcutaneous Tissue) Exposed: Yes Necrotic Quality: Eschar, Adherent Slough Tendon Exposed: No Muscle Exposed: No Joint Exposed: No Bone Exposed: No Periwound Skin Texture Texture Color No Abnormalities Noted: No No Abnormalities Noted: No Moisture No Abnormalities Noted: No Treatment Notes Wound #19 (Toe Great) Wound Laterality: Left Cleanser Peri-Wound Care Topical Primary Dressing Maxorb Extra Ag+ Alginate Dressing, 2x2 (in/in) Discharge Instruction: Apply to wound bed as instructed Secondary Dressing Woven  Gauze Sponges 2x2 in Discharge Instruction: Apply over primary dressing as directed. Secured With Conforming Stretch Gauze Bandage, Sterile 2x75 (in/in) Discharge Instruction: Secure with stretch gauze as directed. Compression Wrap Compression Stockings Add-Ons Electronic Signature(s) Signed: 01/19/2023 4:36:03 PM By: Jamie Pulling RN, BSN Entered  By: [name] on 01/19/2023 07:32:29 -------------------------------------------------------------------------------- Vitals Details Patient Name: Date of Service: Jamie Rink [name]. 01/19/2023 9:15 [name] M Medical Record Number: [id number] Patient Account Number: [id number] Date of Birth/Sex: Treating RN: [birth year] (41 y.o. [name] Primary Care Coty Student: [name] Other Clinician: Referring Islam Villescas: Treating Jaxden Blyden/Extender: [name] in Treatment: 342 Vital Signs Time Taken: 10:00 Temperature (F): 98.8 Height (in): 65 Pulse (bpm): 90 Weight (lbs): 201 Respiratory Rate (breaths/min): 18 Body Mass Index (BMI): 33.4 Blood Pressure (mmHg): 116/84 Reference Range: 80 - 120 mg / dl [name], [name] ([id number]) [id number] Page 10 of 10 Electronic Signature(s) Signed: 01/19/2023 4:36:03 PM By: Jamie Pulling RN, BSN Entered By: [name] on 01/19/2023 07:12:12

## 2023-02-03 ENCOUNTER — Telehealth: Payer: Self-pay | Admitting: Family Medicine

## 2023-02-03 NOTE — Telephone Encounter (Signed)
Copied from CRM [id number]. Topic: Clinical - Medication Question >> Feb 03, 2023  2:33 PM Almira Coaster wrote: Reason for CRM: Patient called to pharmacy regarding their Oxycodone HCl 10 MG TABS prescription and they informed her that it required a Pre-Auth. Patient will like to know more or less the time frame of the approval.

## 2023-02-04 ENCOUNTER — Telehealth: Payer: Self-pay

## 2023-02-04 ENCOUNTER — Other Ambulatory Visit (HOSPITAL_COMMUNITY): Payer: Self-pay

## 2023-02-04 NOTE — Telephone Encounter (Addendum)
Pharmacy Patient Advocate Encounter   Received notification from Pt Calls Messages that prior authorization for OXYCODONE 10MG   is required/requested.   Insurance verification completed.   The patient is insured through [facility] MEDICAID .   Per test claim: PA required; PA submitted to above mentioned insurance via [facility] Tracks Key/confirmation #/EOC [id number] W Status is pending

## 2023-02-04 NOTE — Telephone Encounter (Signed)
PA request has been Submitted. New Encounter created for follow up. For additional info see Pharmacy Prior Auth telephone encounter from 02/04/23.

## 2023-02-04 NOTE — Telephone Encounter (Signed)
Pharmacy Patient Advocate Encounter  Received notification from [facility] MEDICAID that Prior Authorization for OXYCODONE 10MG  has been APPROVED from 02/04/23 to 08/03/23. Ran test claim, Copay is $0. This test claim was processed through [facility]- copay amounts may vary at other pharmacies due to pharmacy/plan contracts, or as the patient moves through the different stages of their insurance plan.   PA #/Case ID/Reference #: [id number]

## 2023-02-05 ENCOUNTER — Other Ambulatory Visit: Payer: Self-pay | Admitting: Family Medicine

## 2023-02-05 DIAGNOSIS — F411 Generalized anxiety disorder: Secondary | ICD-10-CM

## 2023-02-06 ENCOUNTER — Telehealth: Payer: Self-pay

## 2023-02-06 ENCOUNTER — Ambulatory Visit (INDEPENDENT_AMBULATORY_CARE_PROVIDER_SITE_OTHER): Payer: Medicaid Other | Admitting: Gastroenterology

## 2023-02-06 ENCOUNTER — Encounter: Payer: Self-pay | Admitting: Gastroenterology

## 2023-02-06 VITALS — BP 124/72 | HR 88 | Ht 66.0 in | Wt 215.0 lb

## 2023-02-06 DIAGNOSIS — K5909 Other constipation: Secondary | ICD-10-CM

## 2023-02-06 DIAGNOSIS — S34101D Unspecified injury to L1 level of lumbar spinal cord, subsequent encounter: Secondary | ICD-10-CM

## 2023-02-06 DIAGNOSIS — R110 Nausea: Secondary | ICD-10-CM | POA: Diagnosis not present

## 2023-02-06 DIAGNOSIS — K219 Gastro-esophageal reflux disease without esophagitis: Secondary | ICD-10-CM | POA: Diagnosis not present

## 2023-02-06 DIAGNOSIS — K592 Neurogenic bowel, not elsewhere classified: Secondary | ICD-10-CM

## 2023-02-06 DIAGNOSIS — K435 Parastomal hernia without obstruction or  gangrene: Secondary | ICD-10-CM | POA: Diagnosis not present

## 2023-02-06 DIAGNOSIS — G8220 Paraplegia, unspecified: Secondary | ICD-10-CM

## 2023-02-06 DIAGNOSIS — K5900 Constipation, unspecified: Secondary | ICD-10-CM

## 2023-02-06 DIAGNOSIS — S34101S Unspecified injury to L1 level of lumbar spinal cord, sequela: Secondary | ICD-10-CM

## 2023-02-06 MED ORDER — OMEPRAZOLE 40 MG PO CPDR: 40.0000 mg | DELAYED_RELEASE_CAPSULE | Freq: Two times a day (BID) | ORAL | 3 refills | Status: DC
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
## 2023-02-06 NOTE — Patient Instructions (Addendum)
Start taking Miralax 1 capful (17 grams) 1x / day for 1 week.   If this is not effective, increase to 1 dose 2x / day for 1 week.   If this is still not effective, increase to two capfuls (34 grams) 2x / day.   Can adjust dose as needed based on response. Can take 1/2 cap daily, skip days, or increase per day.    We have referred you to [facility], they will contact you with an appointment  We have sent the following medications to your pharmacy for you to pick up at your convenience: Omeprazole 40 mg  _______________________________________________________  If your blood pressure at your visit was 140/90 or greater, please contact your primary care physician to follow up on this.  _______________________________________________________  If you are age 14 or older, your body mass index should be between 23-30. Your Body mass index is 34.7 kg/m. If this is out of the aforementioned range listed, please consider follow up with your Primary Care Provider.  If you are age 36 or younger, your body mass index should be between 19-25. Your Body mass index is 34.7 kg/m. If this is out of the aformentioned range listed, please consider follow up with your Primary Care Provider.   ________________________________________________________  The [facility] providers would like to encourage you to use [facility] to communicate with providers for non-urgent requests or questions.  Due to long hold times on the telephone, sending your provider a message by [facility] may be a faster and more efficient way to get a response.  Please allow 48 business hours for a response.  Please remember that this is for non-urgent requests.  _______________________________________________________   I appreciate the  opportunity to care for you  Thank You   [facility]

## 2023-02-06 NOTE — Telephone Encounter (Signed)
Patient mother [name] called with numerous concerns regarding patient and wanted to follow up with Dr.Wallace. Patient was seeing [facility] but was sent to urology now would like to see a provider at our office again. Informed [name] we would need a referral.    [name] P Edmon Magid, CMA

## 2023-02-10 NOTE — Progress Notes (Signed)
Agree with the assessment and plan as outlined by Cira Servant, PA-C.  If not previously trialed and still having issues with constipation despite MiraLAX, can also consider trialing course of methylnaltrexone which can be used in concert with MiraLAX if needed.  Helix Lafontaine, DO, [facility]

## 2023-02-17 ENCOUNTER — Encounter (HOSPITAL_BASED_OUTPATIENT_CLINIC_OR_DEPARTMENT_OTHER): Payer: Medicaid Other | Attending: General Surgery | Admitting: General Surgery

## 2023-02-17 DIAGNOSIS — Z87891 Personal history of nicotine dependence: Secondary | ICD-10-CM | POA: Insufficient documentation

## 2023-02-17 DIAGNOSIS — L97512 Non-pressure chronic ulcer of other part of right foot with fat layer exposed: Secondary | ICD-10-CM | POA: Diagnosis not present

## 2023-02-17 DIAGNOSIS — L89154 Pressure ulcer of sacral region, stage 4: Secondary | ICD-10-CM | POA: Diagnosis present

## 2023-02-17 DIAGNOSIS — Z8619 Personal history of other infectious and parasitic diseases: Secondary | ICD-10-CM | POA: Diagnosis not present

## 2023-02-17 DIAGNOSIS — L97522 Non-pressure chronic ulcer of other part of left foot with fat layer exposed: Secondary | ICD-10-CM | POA: Insufficient documentation

## 2023-02-17 DIAGNOSIS — Z993 Dependence on wheelchair: Secondary | ICD-10-CM | POA: Insufficient documentation

## 2023-02-17 DIAGNOSIS — G8222 Paraplegia, incomplete: Secondary | ICD-10-CM | POA: Diagnosis not present

## 2023-02-17 DIAGNOSIS — Z5971 Insufficient health insurance coverage: Secondary | ICD-10-CM | POA: Insufficient documentation

## 2023-02-18 NOTE — Progress Notes (Signed)
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 surgical site at the lower left lumbar area became infected she ended up in hospital from 11/6 through 11/7 with wound dehiscence and probable infection. Her surgeon Dr. Franky Macho open the wound debrided it took cultures and placed the wound VAC. She is now receiving IV antibiotics at the direction of infectious disease which I think is ertapenem for 20 days. 03/09/2019 on evaluation today patient appears to be doing quite well with regard to her wound. Its been since 01/17/2019 when we last saw her. She subsequently ended up in the hospital due to [name] baclofen pump which became infected. She has been on antibiotics as prescribed by infectious disease for this and she was seeing Dr. Luciana Axe at [facility] for infectious disease. Subsequently she has been on doxycycline through November 29. The baclofen pump was placed on 12/22/2018 by Dr. Norville Haggard. Unfortunately the patient developed [name] wound infection and underwent debridement by Dr. Franky Macho on 01/08/2019. The growth in the culture revealed ESBL E. coli and diphtheroids and the patient was initially placed on ertapenem him in doxycycline for 3 weeks. Subsequently she comes in today for reevaluation and it does appear that her wound is actually doing significantly better even compared to last  time we saw her. This is in regard to the medial sacral region. 2/5; I have not seen this wound in almost 3 months. Certainly has gotten better in terms of surface area although there is significant direct depth. She has completed antibiotics for the underlying osteomyelitis. 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 She has been using silver alginate. Her mother is changing the dressing. She claims to be offloading this area except for when she gets up to go to doctors appointments 3/12; this is [name] punched-out wound on the lower sacrum. Has [name] depth of about 1.8 cm. It does not appear to undermine. There is no palpable bone. We have been using silver alginate packing her mother is changing the dressing she claims to be offloading this. She had [name] baclofen pump placed to alleviate her spasticity 5/17; the patient has not been seen in over 2 months.  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 Apparently they started to notice an odor and drainage earlier this month. Patient was admitted to [facility] from 5/3 through 07/12/2019. We do not have any records here. Apparently she was found to have pneumonia, [name] UTI. She also went underwent [name] surgical debridement of her lower sacral wound. She comes in today with [name] really large postoperative wound. This does not have exposed bone but very close. This is right back to where this was [name] year or 2 ago. They have been using wet-to-dry. She is on an IV antibiotic but is not sure what drugs. We are not sure what home health company they are currently using. We are trying to get records from [facility]. 6/8; this the patient return to clinic 3 weeks ago. She had surgical debridement of the lower sacral wound. She apparently is completed her IV antibiotics although I am not exactly sure what IV antibiotics she had ordered. Her PICC line is come out. Her  wound is large but generally clean there still is exposed bone. Fortunately the area on the right heel is callused over I cannot prove there is an open area here per 6/22; they are using [name] wet to dry dressing when we left the clinic last time however they managed to find [name] box of silver alginate so they are using that with backing gauze. They are still changing this twice [name] day because of drainage issues. She has Medicaid and they will not be able to replace the want dressing she will have to go back to [name] wet-to-dry. In spite of this the wound actually looks quite good some improvement in dimension 7/13; using silver alginate. Slight improvement in overall wound volume including depth although there is still undermining. She tells me she is offloading this is much as possible 8/10-Patient back at 1 month, continuing to use silver alginate although she has run out and therefore using saline wet-to-dry, undermining is minimal, continuing attempts at offloading according to patient 9/21; its been about 9 weeks since I have seen this patient although I note she was seen once in August. Her wounds on the lower sacrum this looks [name] lot better than the last time I saw this. She is using silver alginate to the wound bed. They only have Medicaid therefore they have to need to pay out-of-pocket for the dressing supplies. This certainly limits options. 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 10/19; 1 month follow-up. Not much change in the wound in fact it is measuring slightly larger. Still using silver alginate which her mother is purchasing off [employer] I believe. Since she was here she had [name] suprapubic catheter placed because of [name] lacerated urethra. 11/30; patient has not been here in about 6 weeks. She apparently has had 3 admissions to the hospital for pneumonia in the last 7 months 2 in the last 2 months. She came out of  hospital this time with 4 L of oxygen. Her mother is using the Anasept wet-to-dry to the sacral wound and surprisingly this looks somewhat better. The patient states she is pending 24/7 in bed except for going to doctor's appointments this may be helping. She has an appointment with pulmonology on 12/17. She was [name] heavy smoker for [name] period of time I wonder whether she has COPD 12/28; patient is  doing well she is off her oxygen which may have something to do with chronic Macrobid she was on [hypersensitivity pneumonitis]. She is also stop smoking. In any case she is doing well from [name] breathing point of view. She is using Anasept wet-to-dry. 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 She has been using Anasept gel wet-to-dry and really has done [name] nice job here. She does not have insurance for other wound care products but truthfully so far this is done [name] really good job. I note she is back on oxygen. 2/22; stage IV wound on her lower sacrum. They have been to using an Anasept gel wet-to-dry-based dressing. Ordinarily I would like to use [name] moistened collagen wet-to-dry but she is apparently not able to afford this. There was also some suggestion about using Dakin's but apparently her mother could not make 3/14; 3-week follow-up. She is going for bladder surgery at [facility] next week. Still using Anasept gel wet-to-dry. We will try to get Prisma wet to dry through what I think is [facility]. This probably will not work [employer], [place] [name] ([id number]) [phone].pdf Page 6 of 17 4/19; 4-week follow-up. She is using collagen with wet-to-dry dressings every day. She reports improvement to the wound healing. Patient had bladder neck surgery with suprapubic catheter placement on 3/22. On 3/30 she was sent to the emergency department because of hypotension. She was found to be in septic shock in the setting of ESBL  E. coli UTI. Currently she is doing well. 5/17; 4-week follow-up. She is using collagen with backing wet-to-dry. Really nice improvement in surface area of these wounds depth at 1.2 cm. She has had problems with urinary leakage. She does have [name] suprapubic catheter 6/14; 4-week follow-up. She was doing really well the last time we saw her in the wound had filled in quite [name] bit. However since that time her wound is gotten [name] lot deeper. Apparently her bladder surgery failed and she is incontinent [name] lot of the time. Furthermore her mother suffered [name] stroke and is staying with her sister. She now has care attendance but I am not really certain about the amount of care she has. We have been using silver collagen but apparently the family has to buy this privately. 6/28; the wound measures slightly larger I certainly do not see the difference. It has 1.5 cm of direct depth but not exposed to bone it does not appear to be infected. We had her using silver alginate although she does not seem to know what her mother's been dressing this with recently 7/12; 2-week follow-up. 1.7 mm of direct depth this is fluctuated [name] fair amount but I do not see any consistent trend towards closure. The tissue looks healthy minimal undermining no palpable bone. No evidence of surrounding infection. We have been using silver alginate wet-to-dry although the patient wants to go back to Anasept gel wet to dry 8/30; we have seen this patient in quite [name] while however her wound has come in nicely at roughly 0.8 or 0.9 mm of direct depth also down in length and width. She is using Anasept wet-to-dry her mother is changing the dressing 9/27; 1 month follow-up. Since she is being here last she apparently has had an attempt to close her urethra by urology in [facility] this was temporarily successful but now is reopened.. We have been using Anasept wet-to-dry. Her variant of Medicaid will not  pay for wound care supplies. Most of what the  use is being paid for out of their own pocket 10/25; 1 month follow-up. Her wound is measuring better she is still using Anasept wet-to-dry. Her mother changes this twice [name] day because of drainage. Overall the wound dimensions are better. The patient states that her recent urologic procedure actually resulted in less incontinence which may be helping Apparently had her mattress overlay on her bed is disintegrating. She asked if I be willing to put in an order for replacement I told her we would try her best although I am not exactly sure how long Medicaid will allow for replacement 11/29; 1 month follow-up. She arrives with her mother who is her primary caregiver. There is still using Anasept wet-to-dry but her mother says because of drainage they are changing this 3 times [name] day. Dimensions are [name] little worse or as last time she was actually [name] little better 12/13; the patient's wound had deteriorated last time. I did [name] culture of the wound that showed of few rare Pseudomonas and [name] few rare Corynebacterium. The Corynebacterium is likely [name] skin contaminant. I did prescribe topical gentamicin under the silver alginate directed at the Pseudomonas. Her mother says that there is [name] lot of drainage she changes the odor gauze packing 3 times [name] day currently using 6 gentamicin under silver alginate covering all of this with ABDs 03/13/2021; patient is using silver alginate. Very little change in this wound this actually goes down to bone with [name] rim of tissue separating environment from underlying sacral bone. There is no real granulation. At the base of this. She has not been systemically unwell. The wound itself not much changed however it abuts right on bone. She has Medicaid which does not give [place] [name] lot of options for either home care or different dressings. We thought this over in some detail. We might be able to get [name] wound VAC through Medicaid but we would not be able to get this changed by home health.  She lives in [place] which she would be [name] far drive to this clinic twice [name] week. We might be able to teach the daughter or friend how to do this but there is [name] lot of issues that need to be worked through before we put this into the final ordering status 03/28/2021; the wound actually looks somewhat better contracted. There is still some undermining but no evidence of infection. We have been using gentamicin and silver alginate. 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 She is apparently going for some form of tendon release next week by orthopedics. Her knees are fixed in extension right leg first apparently 2/16; the wound looks clean not much change in dimensions. She has undermining at roughly 4:00. There is no exposed bone. We are using silver alginate with underlying gentamicin. The last culture I did of this in December showed Pseudomonas which is the reason for the gentamicin topical Since patient was last here she had quadricep tendon release surgery at [facility] 05/09/2021: There has been improvement overall in the wound. The base is fairly clean with minimal slough. The size has contracted.[name] Unfortunately, one of her wounds from her quadricep tendon release is open and there is extensive nonviable tissue. She reports that she is going to have to have this operatively debrided. 06/06/2021: Since her last visit in clinic, she underwent debridement of the open wound from her  quadricep tendon release with placement of Kerecis and primary closure. Her sutures have been removed and she saw her surgeon last week. In the interim, [name] clip from her suprapubic catheter caused [name] new wound to occur on her right thigh and her old left heel pressure ulcer has reopened. Her sacral wound is smaller. None of the wounds appears infected, but there is epibole around the sacral wound and fairly thick slough on the heel wound. 06/20/2021: The heel ulcer is  clean with good granulation tissue. The sacral wound is smaller but measures [name] little deeper today. There is heaped up senescent skin around the edges of the sacral wound. The wound on her thigh is [name] little bit bigger but remains fairly superficial. 07/04/2021: The heel ulcer is nearly completely healed. Very little open tissue. The sacral wound continues to contract around the cavity. There is [name] lot of heaped up senescent tissue around the edges of the wound. The wound on her thigh has not really made much improvement at all. It remains superficial but has [name] thick layer of yellow slough. No concern for infection in any of the wound sites. 07/18/2021: The heel ulcer has closed. The sacral wound is shallower with [name] little bit of slough in the wound base. There is less senescent tissue around the perimeter. The wound on her thigh looks like it might be [name] little deeper in the center. It continues to have [name] fibrous slough layer. No concern for infection. 07/25/2021: We initiated wound VAC therapy last week. The sacral wound is smaller but still has some undermining. There is just [name] little bit of slough in the wound base. The wound on her thigh we began treating with Santyl. The fibrinous slough has diminished and there is some granulation tissue beginning to form. 08/01/2021: For some reason, the sacral wound measures deeper today. It is fairly clean and there is no significant odor from the wound, but the patient's mother reports an increase in the drainage. She has struggled with maintaining [name] good seal with the drape, given the location of the wound. On the other hand, the thigh wound is smaller and shallower. There is just some fibrinous slough on the surface. 08/09/2021: The patient was hospitalized last week with [name] urinary tract infection. She is still feeling [name] bit poorly today. She is currently taking [name] course of oral antibiotics. The sacral wound is [name] little bit smaller today. It does have [name] layer of  slough on the surface. The wound on her thigh continues to contract and is fairly superficial today. There is fibrinous slough at the site, as well. 08/23/2021: The patient was hospitalized again this last week with sepsis. It sounds like urology is planning to perform [name] cystectomy with ileal conduit to help with her urinary leakage issues. She apparently developed [name] cutaneous fungal infection where her wound VAC drape was and so that has been removed and her mom is doing wet-to-dry dressing changes. The periwound skin is now healing, but it is still fairly red and irritated. She has [name] deep tissue injury on the same heel that we had previously treated for an open ulcer, but there is currently no skin opening. The wound on her thigh is very superficial and clean without any slough accumulation. 08/30/2021: The heel looks good today and has not opened. The wound on her thigh is closed. The sacral wound looks better, particularly the periwound skin. Her mother has been applying [name] mixture of Desitin and miconazole to the periwound  and just doing wet-to-dry dressings because we did not want to further irritate the skin with the wound VAC drape. The wound is clean with just [name] little bit of superficial slough and senescent skin heaped up around the margin. 09/05/2021: The periwound skin at her sacral ulcer site is markedly improved. Immediately surrounding the wound orifice, there is some thick, dried, and scaly skin. There is also some slough buildup on the wound surface. No odor or significant drainage. BERDINE, MCCLARAN [name] ([id number]) [id number] Page 7 of 17 7/14; the patient still has the same depth however surrounding granulation looks better. Using wound VAC with topical Keystone antibiotics. She may be eligible for an improved mattress cover and we will look into it 10/11/2021: The wound is [name] little bit deeper today. She is struggling with urinary contamination of her sacral  wound and has subsequently developed maceration and some tissue breakdown. No obvious external infection. There is good granulation tissue on the surface with just [name] little bit of light slough. 10/25/2021: The wound looks better today. The depth has come in by over [name] centimeter. Her skin is in better condition. There is heaped up senescent skin around the wound perimeter. She received her level 3 bed and is very happy with it. 11/08/2021: The undermining has come in substantially. The overall wound depth is about the same. She continues to build up senescent skin around the wound perimeter. 11/22/2021: The wound apparently measured [name] little bit deeper, but it no longer undermines much at all. It is clean. There is senescent skin accumulation around the perimeter, as usual. 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 This was complicated by sepsis secondary to pneumonia. Her wound actually looks quite good. The size is about the same, but the depth has come in considerably and there is less undermining. It is [name] little bit dry around the edges. No concern for infection. 03/17/2022: The patient has been in and out of the hospital since her last visit, primarily with episodes of sepsis related to urinary infections. They have been having issues with her urostomy bags leaking having issues and the patient's mother is concerned that some of the contaminated urine may be reaching her sacral ulcer, which has enlarged since the last time we saw her. On inspection, the wound is [name] little larger, but it is very clean with just some slough and accumulation of senescent skin around the margins. 04/16/2022: No real change to the sacral ulcer. It is clean without any significant slough or other debris accumulation. No concern for infection. 05/22/2022: The patient has been in the hospital and she was not on an  appropriate surface for [name] time. She has also had some leakage from her urostomy due to the hospital not having the correct bags. Finally, her air-fluidized mattress has been leaking and was only just repaired. As result of these issues, her sacral ulcer is [name] little bit bigger, but it remains quite clean with good granulation tissue at the base. 06/19/2022: Her wound is smaller and shallower this week. There is [name] little bit of slough on the surface and some senescent skin around the edges. 07/17/2022: The wound has contracted further and is shallower again today. There is light slough on the wound surface and reaccumulation of the senescent skin around the edges. 08/14/2022: Although the wound measurements have not changed, the cavity has actually contracted quite [name] bit. There is some slough on  the surface and reaccumulation of senescent skin around the wound margins. 09/11/2022: The external wound measurement is smaller but the depth is actually measuring [name] little bit deeper today. She has senescent skin that has reaccumulated around the orifice. 10/16/2022: She has [name] new wound on her right great toe. She apparently developed [name] pustular skin rash and had multiple draining sites; this area is the only remaining wound from that episode. The sacral ulcer is without any undermining, but did measure slightly deeper. She was in the hospital with pneumonia for about [name] week. 11/13/2022: The great toe wound is quite small and has [name] little bit of slough and eschar on the surface. The sacral ulcer is shallower and narrower. She has not accumulated the senescent skin around the edges and there is no discernible slough on the surface. 12/10/2022: The great toe wound is basically the same. The patient's mother said that it had closed but she apparently bumped or rubbed her toe against something and it reopened [name] bit. The sacral ulcer is the smallest I think I have seen it in ages. There is some senescent skin around the  edges and minimal slough on the surface. 01/19/2023: The great toe wound is nearly healed with just [name] small open portion remaining at the tip. She has rubbed [name] new wound open on her opposite great toe; this appears to be secondary to friction potentially on her bed. The sacral ulcer got [name] little bit bigger since her last visit, with some slough and senescent skin reaccumulation. The change in size appears likely secondary to moisture. 02/17/2023: Both toe wounds have [name] thin layer of skin over the top but there is discoloration underneath suggesting that they remain open. The sacral ulcer measured slightly deeper; they had some difficulties with her air-fluidized bed that were only just resolved this past Friday. There is senescent skin heaped up around the edges of the sacral wound, as usual. Electronic Signature(s) Signed: 02/17/2023 2:33:14 PM By: Duanne Guess MD FACS Entered By: Duanne Guess on 02/17/2023 14:24:56 -------------------------------------------------------------------------------- Physical Exam Details Patient Name: Date of Service: SHAKEENA, CANO [name]. 02/17/2023 1:15 PM Medical Record Number: [id number] Patient Account Number: [id number] Date of Birth/Sex: Treating RN: Dec 09, 1980 (42 y.o. F) Primary Care Provider: Delynn Flavin Other Clinician: Referring Provider: Treating Provider/Extender: [name] Weeks in Treatment: 346 Constitutional Slightly hypertensive. . . . no acute distress. Respiratory PAMI, FAVRET [name] ([id number]) [id number] Page 8 of 17 Normal work of breathing on room air.. Notes 02/17/2023: Both toe wounds have [name] thin layer of skin over the top but there is discoloration underneath suggesting that they remain open. The sacral ulcer measured slightly deeper. There is senescent skin heaped up around the edges of the sacral wound, as usual. Electronic Signature(s) Signed: 02/17/2023  2:33:14 PM By: Duanne Guess MD FACS Entered By: Duanne Guess on 02/17/2023 14:27:27 -------------------------------------------------------------------------------- Physician Orders Details Patient Name: Date of Service: YOVANKA, KIRCHNER [name]. 02/17/2023 1:15 PM Medical Record Number: [id number] Patient Account Number: [id number] Date of Birth/Sex: Treating RN: 1980/11/08 (42 y.o. 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 placed on ertapenem him in doxycycline for 3 weeks. Subsequently she comes in today for reevaluation and it does appear that her wound is actually doing significantly better even compared to last time we saw her. This is in regard to the medial sacral region. 2/5; I have not seen this wound in almost 3 months. Certainly has gotten better in terms of surface area although there is significant direct depth. She has completed antibiotics for the  underlying osteomyelitis. 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 She has been using silver alginate. Her mother is changing the dressing. She claims to be offloading this area except for when she gets up to go to doctors appointments 3/12; this is [name] punched-out wound on the lower sacrum. Has [name] depth of about 1.8 cm. It does not appear to undermine. There is no palpable bone. We have been using silver alginate packing her mother is changing the dressing she claims to be offloading this. She had [name] baclofen pump placed to alleviate her spasticity 5/17; the patient has not been seen in over 2 months. 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 Apparently they started to notice an odor and drainage earlier this month. Patient was admitted to [facility] from 5/3 through 07/12/2019. We do not have any records here. Apparently she was found to have pneumonia, [name] UTI. She also went underwent [name] surgical debridement of her lower sacral  wound. She comes in today with [name] really large postoperative wound. This does not have exposed bone but very close. This is right back to where this was [name] year or 2 ago. They have been using wet-to-dry. She is on an IV antibiotic but is not sure what drugs. We are not sure what home health company they are currently using. We are trying to get records from [facility]. 6/8; this the patient return to clinic 3 weeks ago. She had surgical debridement of the lower sacral wound. She apparently is completed her IV antibiotics although I am not exactly sure what IV antibiotics she had ordered. Her PICC line is come out. Her wound is large but generally clean there still is exposed bone. Fortunately the area on the right heel is callused over I cannot prove there is an open area here per 6/22; they are using [name] wet to dry dressing when we left the clinic last time however they  managed to find [name] box of silver alginate so they are using that with backing gauze. They are still changing this twice [name] day because of drainage issues. She has Medicaid and they will not be able to replace the want dressing she will have to go back to [name] wet-to-dry. In spite of this the wound actually looks quite good some improvement in dimension 7/13; using silver alginate. Slight improvement in overall wound volume including depth although there is still undermining. She tells me she is offloading this is much as possible 8/10-Patient back at 1 month, continuing to use silver alginate although she has run out and therefore using saline wet-to-dry, undermining is minimal, continuing attempts at offloading according to patient 9/21; its been about 9 weeks since I have seen this patient although I note she was seen once in August. Her wounds on the lower sacrum this looks [name] lot better than the last time I saw this. She is using silver alginate to the wound bed. They only have Medicaid therefore they have to need to pay out-of-pocket for the dressing supplies. This certainly limits options. 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 10/19; 1 month follow-up. Not much change in the wound in fact it is measuring slightly larger. Still using silver alginate which her mother is purchasing off [employer] I believe. Since she was here she had [name] suprapubic catheter placed because of [name] lacerated urethra. 11/30; patient has not been here in about 6 weeks. She apparently has had 3 admissions to the hospital for pneumonia in the last 7 months 2 in the last 2 months. She came out of hospital this time with 4 L of oxygen. Her mother is using the Anasept wet-to-dry to the sacral wound and surprisingly this looks somewhat better. The patient states she is pending 24/7 in bed except for going to doctor's appointments this may be helping. She has  an appointment with pulmonology on 12/17. She was [name] heavy smoker for [name] period of time I wonder whether she has COPD 12/28; patient is doing well she is off her oxygen which may have something to do with chronic Macrobid she was on [hypersensitivity pneumonitis]. She is also stop smoking. In any case she is doing well from [name] breathing point of view. She is using Anasept wet-to-dry. 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 She has been using Anasept gel wet-to-dry  and really has done [name] nice job here. She does not have insurance for other wound care products but truthfully so far this is done [name] really good job. I note she is back on oxygen. 2/22; stage IV wound on her lower sacrum. They have been to using an Anasept gel wet-to-dry-based dressing. Ordinarily I would like to use [name] moistened collagen wet-to-dry but she is apparently not able to afford this. There was also some suggestion about using Dakin's but apparently her mother could not make 3/14; 3-week follow-up. She is going for bladder surgery at [facility] next week. Still using Anasept gel wet-to-dry. We will try to get Prisma wet to dry through what I think is [facility]. This probably will not work 4/19; 4-week follow-up. She is using collagen with wet-to-dry dressings every day. She reports improvement to the wound healing. Patient had bladder neck surgery with suprapubic catheter placement on 3/22. On 3/30 she was sent to the emergency department because of hypotension. She was found to be in septic shock in the setting of ESBL E. coli UTI. Currently she is doing well. 5/17; 4-week follow-up. She is using collagen with backing wet-to-dry. Really nice improvement in surface area of these wounds depth at 1.2 cm. She has had problems with urinary leakage. She does have [name] suprapubic catheter Catheryn, Younis Monnica [name] ([id number]) [phone].pdf Page 13 of  17 6/14; 4-week follow-up. She was doing really well the last time we saw her in the wound had filled in quite [name] bit. However since that time her wound is gotten [name] lot deeper. Apparently her bladder surgery failed and she is incontinent [name] lot of the time. Furthermore her mother suffered [name] stroke and is staying with her sister. She now has care attendance but I am not really certain about the amount of care she has. We have been using silver collagen but apparently the family has to buy this privately. 6/28; the wound measures slightly larger I certainly do not see the difference. It has 1.5 cm of direct depth but not exposed to bone it does not appear to be infected. We had her using silver alginate although she does not seem to know what her mother's been dressing this with recently 7/12; 2-week follow-up. 1.7 mm of direct depth this is fluctuated [name] fair amount but I do not see any consistent trend towards closure. The tissue looks healthy minimal undermining no palpable bone. No evidence of surrounding infection. We have been using silver alginate wet-to-dry although the patient wants to go back to Anasept gel wet to dry 8/30; we have seen this patient in quite [name] while however her wound has come in nicely at roughly 0.8 or 0.9 mm of direct depth also down in length and width. She is using Anasept wet-to-dry her mother is changing the dressing 9/27; 1 month follow-up. Since she is being here last she apparently has had an attempt to close her urethra by urology in [facility] this was temporarily successful but now is reopened.. We have been using Anasept wet-to-dry. Her variant of Medicaid will not pay for wound care supplies. Most of what the use is being paid for out of their own pocket 10/25; 1 month follow-up. Her wound is measuring better she is still using Anasept wet-to-dry. Her mother changes this twice [name] day because of drainage. Overall the wound dimensions are better. The patient states  that her recent urologic procedure actually resulted in less incontinence which may be helping Apparently had her  mattress overlay on her bed is disintegrating. She asked if I be willing to put in an order for replacement I told her we would try her best although I am not exactly sure how long Medicaid will allow for replacement 11/29; 1 month follow-up. She arrives with her mother who is her primary caregiver. There is still using Anasept wet-to-dry but her mother says because of drainage they are changing this 3 times [name] day. Dimensions are [name] little worse or as last time she was actually [name] little better 12/13; the patient's wound had deteriorated last time. I did [name] culture of the wound that showed of few rare Pseudomonas and [name] few rare Corynebacterium. The Corynebacterium is likely [name] skin contaminant. I did prescribe topical gentamicin under the silver alginate directed at the Pseudomonas. Her mother says that there is [name] lot of drainage she changes the odor gauze packing 3 times [name] day currently using 6 gentamicin under silver alginate covering all of this with ABDs 03/13/2021; patient is using silver alginate. Very little change in this wound this actually goes down to bone with [name] rim of tissue separating environment from underlying sacral bone. There is no real granulation. At the base of this. She has not been systemically unwell. The wound itself not much changed however it abuts right on bone. She has Medicaid which does not give [place] [name] lot of options for either home care or different dressings. We thought this over in some detail. We might be able to get [name] wound VAC through Medicaid but we would not be able to get this changed by home health. She lives in [place] which she would be [name] far drive to this clinic twice [name] week. We might be able to teach the daughter or friend how to do this but there is [name] lot of issues that need to be worked through before we put this into the final  ordering status 03/28/2021; the wound actually looks somewhat better contracted. There is still some undermining but no evidence of infection. We have been using gentamicin and silver alginate. 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 She is apparently going for some form of tendon release next week by orthopedics. Her knees are fixed in extension right leg first apparently 2/16; the wound looks clean not much change in dimensions. She has undermining at roughly 4:00. There is no exposed bone. We are using silver alginate with underlying gentamicin. The last culture I did of this in December showed Pseudomonas which is the reason for the gentamicin topical Since patient was last here she had quadricep tendon release surgery at [facility] 05/09/2021: There has been improvement overall in the wound. The base is fairly clean with minimal slough. The size has contracted.[name] Unfortunately, one of her wounds from her quadricep tendon release is open and there is extensive nonviable tissue. She reports that she is going to have to have this operatively debrided. 06/06/2021: Since her last visit in clinic, she underwent debridement of the open wound from her quadricep tendon release with placement of Kerecis and primary closure. Her sutures have been removed and she saw her surgeon last week. In the interim, [name] clip from her suprapubic catheter caused [name] new wound to occur on her right thigh and her old left heel pressure ulcer has reopened. Her sacral wound is smaller. None of the wounds appears infected, but there is epibole around the sacral wound and fairly  thick slough on the heel wound. 06/20/2021: The heel ulcer is clean with good granulation tissue. The sacral wound is smaller but measures [name] little deeper today. There is heaped up senescent skin around the edges of the sacral wound. The wound on her thigh is [name] little bit bigger but remains fairly  superficial. 07/04/2021: The heel ulcer is nearly completely healed. Very little open tissue. The sacral wound continues to contract around the cavity. There is [name] lot of heaped up senescent tissue around the edges of the wound. The wound on her thigh has not really made much improvement at all. It remains superficial but has [name] thick layer of yellow slough. No concern for infection in any of the wound sites. 07/18/2021: The heel ulcer has closed. The sacral wound is shallower with [name] little bit of slough in the wound base. There is less senescent tissue around the perimeter. The wound on her thigh looks like it might be [name] little deeper in the center. It continues to have [name] fibrous slough layer. No concern for infection. 07/25/2021: We initiated wound VAC therapy last week. The sacral wound is smaller but still has some undermining. There is just [name] little bit of slough in the wound base. The wound on her thigh we began treating with Santyl. The fibrinous slough has diminished and there is some granulation tissue beginning to form. 08/01/2021: For some reason, the sacral wound measures deeper today. It is fairly clean and there is no significant odor from the wound, but the patient's mother reports an increase in the drainage. She has struggled with maintaining [name] good seal with the drape, given the location of the wound. On the other hand, the thigh wound is smaller and shallower. There is just some fibrinous slough on the surface. 08/09/2021: The patient was hospitalized last week with [name] urinary tract infection. She is still feeling [name] bit poorly today. She is currently taking [name] course of oral antibiotics. The sacral wound is [name] little bit smaller today. It does have [name] layer of slough on the surface. The wound on her thigh continues to contract and is fairly superficial today. There is fibrinous slough at the site, as well. 08/23/2021: The patient was hospitalized again this last week with sepsis. It sounds like  urology is planning to perform [name] cystectomy with ileal conduit to help with her urinary leakage issues. She apparently developed [name] cutaneous fungal infection where her wound VAC drape was and so that has been removed and her mom is doing wet-to-dry dressing changes. The periwound skin is now healing, but it is still fairly red and irritated. She has [name] deep tissue injury on the same heel that we had previously treated for an open ulcer, but there is currently no skin opening. The wound on her thigh is very superficial and clean without any slough accumulation. 08/30/2021: The heel looks good today and has not opened. The wound on her thigh is closed. The sacral wound looks better, particularly the periwound skin. Her mother has been applying [name] mixture of Desitin and miconazole to the periwound and just doing wet-to-dry dressings because we did not want to further irritate the skin with the wound VAC drape. The wound is clean with just [name] little bit of superficial slough and senescent skin heaped up around the margin. 09/05/2021: The periwound skin at her sacral ulcer site is markedly improved. Immediately surrounding the wound orifice, there is some thick, dried, and scaly skin. There is also some slough buildup  on the wound surface. No odor or significant drainage. 7/14; the patient still has the same depth however surrounding granulation looks better. Using wound VAC with topical Keystone antibiotics. She may be eligible for an improved mattress cover and we will look into it 10/11/2021: The wound is [name] little bit deeper today. She is struggling with urinary contamination of her sacral wound and has subsequently developed maceration and some tissue breakdown. No obvious external infection. There is good granulation tissue on the surface with just [name] little bit of light slough. [name], [name] ([id number]) [id number] Page 14 of 17 10/25/2021: The wound looks better today. The  depth has come in by over [name] centimeter. Her skin is in better condition. There is heaped up senescent skin around the wound perimeter. She received her level 3 bed and is very happy with it. 11/08/2021: The undermining has come in substantially. The overall wound depth is about the same. She continues to build up senescent skin around the wound perimeter. 11/22/2021: The wound apparently measured [name] little bit deeper, but it no longer undermines much at all. It is clean. There is senescent skin accumulation around the perimeter, as usual. 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 This was complicated by sepsis secondary to pneumonia. Her wound actually looks quite good. The size is about the same, but the depth has come in considerably and there is less undermining. It is [name] little bit dry around the edges. No concern for infection. 03/17/2022: The patient has been in and out of the hospital since her last visit, primarily with episodes of sepsis related to urinary infections. They have been having issues with her urostomy bags leaking having issues and the patient's mother is concerned that some of the contaminated urine may be reaching her sacral ulcer, which has enlarged since the last time we saw her. On inspection, the wound is [name] little larger, but it is very clean with just some slough and accumulation of senescent skin around the margins. 04/16/2022: No real change to the sacral ulcer. It is clean without any significant slough or other debris accumulation. No concern for infection. 05/22/2022: The patient has been in the hospital and she was not on an appropriate surface for [name] time. She has also had some leakage from her urostomy due to the hospital not having the correct bags. Finally, her air-fluidized mattress has been leaking and was only just repaired. As result of these issues, her  sacral ulcer is [name] little bit bigger, but it remains quite clean with good granulation tissue at the base. 06/19/2022: Her wound is smaller and shallower this week. There is [name] little bit of slough on the surface and some senescent skin around the edges. 07/17/2022: The wound has contracted further and is shallower again today. There is light slough on the wound surface and reaccumulation of the senescent skin around the edges. 08/14/2022: Although the wound measurements have not changed, the cavity has actually contracted quite [name] bit. There is some slough on the surface and reaccumulation of senescent skin around the wound margins. 09/11/2022: The external wound measurement is smaller but the depth is actually measuring [name] little bit deeper today. She has senescent skin that has reaccumulated around the orifice. 10/16/2022: She has [name] new wound on her right great toe. She apparently developed [name] pustular skin rash and had multiple draining sites; this area is the only remaining wound from that  episode. The sacral ulcer is without any undermining, but did measure slightly deeper. She was in the hospital with pneumonia for about [name] week. 11/13/2022: The great toe wound is quite small and has [name] little bit of slough and eschar on the surface. The sacral ulcer is shallower and narrower. She has not accumulated the senescent skin around the edges and there is no discernible slough on the surface. 12/10/2022: The great toe wound is basically the same. The patient's mother said that it had closed but she apparently bumped or rubbed her toe against something and it reopened [name] bit. The sacral ulcer is the smallest I think I have seen it in ages. There is some senescent skin around the edges and minimal slough on the surface. 01/19/2023: The great toe wound is nearly healed with just [name] small open portion remaining at the tip. She has rubbed [name] new wound open on her opposite great toe; this appears to be secondary to  friction potentially on her bed. The sacral ulcer got [name] little bit bigger since her last visit, with some slough and senescent skin reaccumulation. The change in size appears likely secondary to moisture. 02/17/2023: Both toe wounds have [name] thin layer of skin over the top but there is discoloration underneath suggesting that they remain open. The sacral ulcer measured slightly deeper; they had some difficulties with her air-fluidized bed that were only just resolved this past Friday. There is senescent skin heaped up around the edges of the sacral wound, as usual. Objective Constitutional Slightly hypertensive. no acute distress. Vitals Time Taken: 1:26 AM, Height: 65 in, Weight: 201 lbs, BMI: 33.4, Temperature: 98 F, Pulse: 94 bpm, Respiratory Rate: 18 breaths/min, Blood Pressure: 139/97 mmHg. Respiratory Normal work of breathing on room air.. General Notes: 02/17/2023: Both toe wounds have [name] thin layer of skin over the top but there is discoloration underneath suggesting that they remain open. The sacral ulcer measured slightly deeper. There is senescent skin heaped up around the edges of the sacral wound, as usual. Integumentary (Hair, Skin) Wound #1 status is Open. Original cause of wound was Pressure Injury. The date acquired was: 05/15/2016. The wound has been in treatment 346 weeks. The wound is located on the Medial Sacrum. The wound measures 2.5cm length x 1.2cm width x 1.4cm depth; 2.356cm^2 area and 3.299cm^3 volume. There is Fat Layer (Subcutaneous Tissue) exposed. There is no tunneling or undermining noted. There is [name] medium amount of serosanguineous drainage noted. The wound margin is distinct with the outline attached to the wound base. There is large (67-100%) red, pink granulation within the wound bed. There is [name] small (1-33%) amount of necrotic tissue within the wound bed including Adherent Slough. The periwound skin appearance had no abnormalities noted for texture. The periwound  skin appearance had no abnormalities noted for color. The periwound skin appearance exhibited: Maceration. The periwound skin appearance did not exhibit: Dry/Scaly. Periwound temperature was noted as No Abnormality. Wound #18 status is Open. Original cause of wound was Blister. The date acquired was: 09/14/2022. The wound has been in treatment 17 weeks. The wound is located on the Right T Great. The wound measures 0.1cm length x 0.1cm width x 0.1cm depth; 0.008cm^2 area and 0.001cm^3 volume. There is no tunneling oe or undermining noted. There is [name] none present amount of drainage noted. The wound margin is flat and intact. There is no granulation within the wound bed. TULSI, GRAETER [name] ([id number]) [id number] Page 15 of 17 There is [name] large (67-100%)  amount of necrotic tissue within the wound bed including Eschar. The periwound skin appearance had no abnormalities noted for texture. The periwound skin appearance had no abnormalities noted for moisture. The periwound skin appearance had no abnormalities noted for color. Periwound temperature was noted as No Abnormality. Wound #19 status is Open. Original cause of wound was Shear/Friction. The date acquired was: 01/19/2023. The wound has been in treatment 4 weeks. The wound is located on the Left T Great. The wound measures 0cm length x 0cm width x 0cm depth; 0cm^2 area and 0cm^3 volume. There is no tunneling or oe undermining noted. There is [name] none present amount of drainage noted. There is no granulation within the wound bed. There is no necrotic tissue within the wound bed. The periwound skin appearance had no abnormalities noted for moisture. The periwound skin appearance exhibited: Ecchymosis. Periwound temperature was noted as No Abnormality. 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 There was [name] Excisional Skin/Subcutaneous Tissue Debridement with [name] total area of 2.36 sq cm performed by Duanne Guess, MD. With the following instrument(s): Curette to remove Viable and Non-Viable tissue/material. Material removed includes Subcutaneous Tissue, Slough, and Skin: Epidermis. No specimens were taken. [name] time out was conducted at 14:00, prior to the start of the procedure. [name] Minimum amount of bleeding was controlled with Pressure. The procedure was tolerated well with [name] pain level of Insensate throughout and [name] pain level of Insensate following the procedure. Post Debridement Measurements: 2.5cm length x 1.2cm width x 1.4cm depth; 3.299cm^3 volume. Post debridement Stage noted as Category/Stage IV. Character of Wound/Ulcer Post Debridement is improved. Post procedure Diagnosis Wound #1: Same as Pre-Procedure Wound #18 Pre-procedure diagnosis of Wound #18 is an Infection - not elsewhere classified located on the Right T Great . There was [name] Excisional Skin/Subcutaneous oe Tissue Debridement with [name] total area of 0.03 sq cm performed by Duanne Guess, MD. With the following instrument(s): Curette to remove Viable and Non- Viable tissue/material. Material removed includes Subcutaneous Tissue and Skin: Epidermis and. No specimens were taken. [name] time out was conducted at 14:00, prior to the start of the procedure. [name] Minimum amount of bleeding was controlled with Pressure. The procedure was tolerated well with [name] pain level of Insensate throughout and [name] pain level of Insensate following the procedure. Post Debridement Measurements: 0.2cm length x 0.2cm width x 0.1cm depth; 0.003cm^3 volume. Character of Wound/Ulcer Post Debridement is improved. Post procedure Diagnosis Wound #18: Same as  Pre-Procedure Wound #19 Pre-procedure diagnosis of Wound #19 is an Abrasion located on the Left T Great . There was [name] Excisional Skin/Subcutaneous Tissue Debridement with [name] oe total area of 0.07 sq cm performed by Duanne Guess, MD. With the following instrument(s): Curette to remove Viable and Non-Viable tissue/material. Material removed includes Subcutaneous Tissue and Skin: Epidermis and. No specimens were taken. [name] time out was conducted at 14:00, prior to the start of the procedure. [name] Minimum amount of bleeding was controlled with Pressure. The procedure was tolerated well with [name] pain level of Insensate throughout and [name] pain level of Insensate following the procedure. Post Debridement Measurements: 0.3cm length x 0.3cm width x 0.1cm depth; 0.007cm^3 volume. Character of Wound/Ulcer Post Debridement is improved.  Post procedure Diagnosis Wound #19: Same as Pre-Procedure Plan Follow-up Appointments: Return appointment in 1 month. - Dr. Lady Gary Room 1 ***HOYER*** Anesthetic: (In clinic) Topical Lidocaine 4% applied to wound bed Bathing/ Shower/ Hygiene: May shower and wash wound with soap and water. Off-Loading: Air fluidized (Group 3) mattress - medical modalities Turn and reposition every 2 hours WOUND #1: - Sacrum Wound Laterality: Medial Cleanser: Soap and Water 1 x Per Day/30 Days Discharge Instructions: May shower and wash wound with dial antibacterial soap and water prior to dressing change. Cleanser: Vashe 5.8 (oz) 1 x Per Day/30 Days Discharge Instructions: Cleanse the wound with Vashe prior to applying [name] clean dressing using gauze sponges, not tissue or cotton balls. Peri-Wound Care: Zinc Oxide Ointment 30g tube 1 x Per Day/30 Days Discharge Instructions: Apply Zinc Oxide to periwound as needed for moisture Prim Dressing: Maxorb Extra Ag+ Alginate Dressing, 2x2 (in/in) 1 x Per Day/30 Days ary Discharge Instructions: Apply to wound bed as instructed Secondary Dressing:  Zetuvit Plus Silicone Border Dressing 5x5 (in/in) 1 x Per Day/30 Days Discharge Instructions: Apply silicone border over primary dressing as directed. [name], [name] ([id number]) [id number] Page 16 of 17 WOUND #18: - T Great Wound Laterality: Right oe Prim Dressing: Maxorb Extra Ag+ Alginate Dressing, 2x2 (in/in) Every Other Day/30 Days ary Discharge Instructions: Apply to wound bed as instructed Secondary Dressing: Woven Gauze Sponges 2x2 in Every Other Day/30 Days Discharge Instructions: Apply over primary dressing as directed. Secured With: Insurance underwriter, Sterile 2x75 (in/in) Every Other Day/30 Days Discharge Instructions: Secure with stretch gauze as directed. WOUND #19: - T Great Wound Laterality: Left oe Prim Dressing: Maxorb Extra Ag+ Alginate Dressing, 2x2 (in/in) Every Other Day/30 Days ary Discharge Instructions: Apply to wound bed as instructed Secondary Dressing: Woven Gauze Sponges 2x2 in Every Other Day/30 Days Discharge Instructions: Apply over primary dressing as directed. Secured With: Insurance underwriter, Sterile 2x75 (in/in) Every Other Day/30 Days Discharge Instructions: Secure with stretch gauze as directed. 02/17/2023: Both toe wounds have [name] thin layer of skin over the top but there is discoloration underneath suggesting that they remain open. The sacral ulcer measured slightly deeper; they had some difficulties with her air-fluidized bed that were only just resolved this past Friday. There is senescent skin heaped up around the edges of the sacral wound, as usual. I used [name] curette to debride the thin layer of skin off of the toe wounds and discovered that indeed, the wounds remained open underneath. Subcutaneous tissue was debrided. I also used [name] curette to debride slough, subcutaneous tissue, and senescent skin from the sacral ulcer. We will apply silver alginate to the toe wounds and continue to pack  the sacral wound cavity with silver alginate. Hopefully, now that her air-fluidized bed has been replaced, we will not have further issues with worsening of the sacral wound. Follow-up in 1 month. Electronic Signature(s) Signed: 02/17/2023 2:33:14 PM By: Duanne Guess MD FACS Entered By: Duanne Guess on 02/17/2023 14:29:41 -------------------------------------------------------------------------------- SuperBill Details Patient Name: Date of Service: [name]. 02/17/2023 Medical Record Number: [id number] Patient Account Number: [id number] Date of Birth/Sex: Treating RN: 1980-12-06 (42 y.o. F) Primary Care Provider: Delynn Flavin Other Clinician: Referring Provider: Treating Provider/Extender: [name] Weeks in Treatment: 346 Diagnosis Coding ICD-10 Codes Code Description L89.154 Pressure ulcer of sacral region, stage 4 L97.512 Non-pressure chronic ulcer of other part of right foot with fat layer exposed L97.522 Non-pressure chronic ulcer of other part of left  foot with fat layer exposed G82.22 Paraplegia, incomplete Facility Procedures : CPT4 Code: [id number] Description: 11042 - DEB SUBQ TISSUE 20 SQ CM/< ICD-10 Diagnosis Description L89.154 Pressure ulcer of sacral region, stage 4 L97.512 Non-pressure chronic ulcer of other part of right foot with fat layer exposed L97.522 Non-pressure chronic ulcer of  other part of left foot with fat layer exposed Modifier: Quantity: 1 Physician Procedures : CPT4 Code Description Modifier [id number] 99214 - WC PHYS LEVEL 4 - EST PT ICD-10 Diagnosis Description L89.154 Pressure ulcer of sacral region, stage 4 L97.512 Non-pressure chronic ulcer of other part of right foot with fat layer exposed L97.522  Non-pressure chronic ulcer of other part of left foot with fat layer exposed G82.22 Paraplegia, incomplete Slaby, Margeaux [name] ([id number]) [id number] P Quantity: 1 age 61 of  42 : [id number] 11042 - WC PHYS SUBQ TISS 20 SQ CM 1 ICD-10 Diagnosis Description L89.154 Pressure ulcer of sacral region, stage 4 L97.512 Non-pressure chronic ulcer of other part of right foot with fat layer exposed L97.522 Non-pressure chronic ulcer of other  part of left foot with fat layer exposed Quantity: Electronic Signature(s) Signed: 02/17/2023 2:33:14 PM By: Duanne Guess MD FACS Entered By: Duanne Guess on 02/17/2023 14:32:04

## 2023-02-18 NOTE — Progress Notes (Signed)
[name] ([id number]) [id number] Page 1 of 10 Visit Report for 02/17/2023 Arrival Information Details Patient Name: Date of Service: [name], [name] 02/17/2023 1:15 PM Medical Record Number: [id number] Patient Account Number: [id number] Date of Birth/Sex: Treating RN: 26-Dec-1980 (42 y.o. F) Primary Care Jasmaine Rochel: Delynn Flavin Other Clinician: Referring Honor Fairbank: Treating Saanvika Vazques/Extender: [name] in Treatment: 346 Visit Information History Since Last Visit Added or deleted any medications: No Patient Arrived: Wheel Chair Any new allergies or adverse reactions: No Arrival Time: 13:26 Had [name] fall or experienced change in No Accompanied By: caregiving activities of daily living that may affect Transfer Assistance: None risk of falls: Patient Identification Verified: Yes Signs or symptoms of abuse/neglect since last visito No Secondary Verification Process Completed: Yes Hospitalized since last visit: No Patient Requires Transmission-Based Precautions: No Implantable device outside of the clinic excluding No Patient Has Alerts: No cellular tissue based products placed in the center since last visit: Has Dressing in Place as Prescribed: Yes Pain Present Now: No Electronic Signature(s) Signed: 02/17/2023 5:11:40 PM By: Zenaida Deed RN, BSN Entered By: Zenaida Deed on 02/17/2023 13:45:27 -------------------------------------------------------------------------------- Encounter Discharge Information Details Patient Name: Date of Service: Jamie Rink [name]. 02/17/2023 1:15 PM Medical Record Number: [id number] Patient Account Number: [id number] Date of Birth/Sex: Treating RN: 1980-10-17 (41 y.o. [name] Primary Care Orvan Papadakis: Delynn Flavin Other Clinician: Referring Zadkiel Dragan: Treating Alessio Bogan/Extender: [name] in Treatment: [phone] Encounter Discharge  Information Items Post Procedure Vitals Discharge Condition: Stable Temperature (F): 98 Ambulatory Status: Wheelchair Pulse (bpm): 94 Discharge Destination: Home Respiratory Rate (breaths/min): 18 Transportation: Other Blood Pressure (mmHg): 139/97 Accompanied By: mother and caregiver Schedule Follow-up Appointment: Yes Clinical Summary of Care: Patient Declined Electronic Signature(s) Signed: 02/17/2023 5:11:40 PM By: Zenaida Deed RN, BSN Entered By: Zenaida Deed on 02/17/2023 16:20:27 [name] ([id number]) [id number] Page 2 of 10 -------------------------------------------------------------------------------- Lower Extremity Assessment Details Patient Name: Date of Service: [name] 02/17/2023 1:15 PM Medical Record Number: [id number] Patient Account Number: [id number] Date of Birth/Sex: Treating RN: 1980/09/27 (42 y.o. [name] Primary Care Candelario Steppe: Delynn Flavin Other Clinician: Referring Analisia Kingsford: Treating Zian Mohamed/Extender: [name] Weeks in Treatment: 346 Edema Assessment Assessed: [Left: No] [Right: No] Edema: [Left: No] [Right: No] Vascular Assessment Pulses: Dorsalis Pedis Palpable: [Left:Yes] [Right:Yes] Extremity colors, hair growth, and conditions: Extremity Color: [Left:Normal] [Right:Normal] Temperature of Extremity: [Left:Warm < 3 seconds] [Right:Warm < 3 seconds] Electronic Signature(s) Signed: 02/17/2023 5:11:40 PM By: Zenaida Deed RN, BSN Entered By: Zenaida Deed on 02/17/2023 13:48:57 -------------------------------------------------------------------------------- Multi Wound Chart Details Patient Name: Date of Service: Jamie Rink [name]. 02/17/2023 1:15 PM Medical Record Number: [id number] Patient Account Number: [id number] Date of Birth/Sex: Treating RN: 1980/08/06 (42 y.o. F) Primary Care Ferris Fielden: Delynn Flavin Other Clinician: Referring  Joreen Swearingin: Treating Brihanna Devenport/Extender: [name] Weeks in Treatment: 346 Vital Signs Height(in): 65 Pulse(bpm): 94 Weight(lbs): 201 Blood Pressure(mmHg): 139/97 Body Mass Index(BMI): 33.4 Temperature(F): 98 Respiratory Rate(breaths/min): 18 [1:Photos:] Medial Sacrum Right T Great oe Left T Great oe Wound Location: Pressure Injury Blister Shear/Friction Wounding Event: Pressure Ulcer Infection - not elsewhere classified Abrasion Primary Etiology: Anemia, Osteomyelitis Anemia, Osteomyelitis Anemia, Osteomyelitis Comorbid History: PRUDIE, NESTOR [name] ([id number]) [id number] Page 3 of 10 05/15/2016 09/14/2022 01/19/2023 Date Acquired: 346 17 4 Weeks of Treatment: Open Open Open Wound Status: No No No Wound Recurrence: 2.5x1.2x1.4 0.1x0.1x0.1 0x0x0 Measurements L x W x D (cm) 2.356 0.008 0 [name] (cm) : rea 3.299 0.001 0 Volume (  cm) : 92.80% 96.60% 100.00% % Reduction in Area: 97.70% 95.80% 100.00% % 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 Full Thickness Without Exposed Full Thickness Without Exposed Classification: Support Structures Support Structures Medium None Present None Present Exudate [name] mount: Serosanguineous N/[name] N/[name] Exudate Type: red, brown N/[name] N/[name] Exudate Color: Distinct, outline attached Flat and Intact N/[name] Wound Margin: Large (67-100%) None Present (0%) None Present (0%) Granulation [name] mount: Red, Pink N/[name] N/[name] Granulation Quality: Small (1-33%) Large (67-100%) None Present (0%) Necrotic [name] mount: Adherent Slough Eschar N/[name] Necrotic Tissue: Fat Layer (Subcutaneous Tissue): Yes Fascia: No Fascia: No Exposed Structures: Fascia: No Fat Layer (Subcutaneous Tissue): No Fat Layer (Subcutaneous Tissue): No Tendon: No Tendon: No Tendon: No Muscle: No Muscle: No Muscle: No Joint: No Joint: No Joint: No Bone: No Bone: No Bone: No Small (1-33%) Large (67-100%) Large  (67-100%) Epithelialization: Debridement - Excisional Debridement - Excisional Debridement - Excisional Debridement: Pre-procedure Verification/Time Out 14:00 14:00 14:00 Taken: Subcutaneous, Slough Subcutaneous Subcutaneous Tissue Debrided: Skin/Subcutaneous Tissue Skin/Subcutaneous Tissue Skin/Subcutaneous Tissue Level: 2.36 0.03 0.07 Debridement [name] (sq cm): rea Curette Curette Curette Instrument: Minimum Minimum Minimum Bleeding: Pressure Pressure Pressure Hemostasis [name] chieved: Insensate Insensate Insensate Procedural Pain: Insensate Insensate Insensate Post Procedural Pain: Procedure was tolerated well Procedure was tolerated well Procedure was tolerated well Debridement Treatment Response: 2.5x1.2x1.4 0.2x0.2x0.1 0.3x0.3x0.1 Post Debridement Measurements L x W x D (cm) 3.299 0.003 0.007 Post Debridement Volume: (cm) Category/Stage IV N/[name] N/[name] Post Debridement Stage: Rash: No No Abnormalities Noted Periwound Skin Texture: Maceration: Yes No Abnormalities Noted No Abnormalities Noted Periwound Skin Moisture: Dry/Scaly: No No Abnormalities Noted No Abnormalities Noted Ecchymosis: Yes Periwound Skin Color: No Abnormality No Abnormality No Abnormality Temperature: Debridement Debridement Debridement Procedures Performed: Treatment Notes Electronic Signature(s) Signed: 02/17/2023 2:33:14 PM By: Duanne Guess MD FACS Entered By: Duanne Guess on 02/17/2023 14:22:55 -------------------------------------------------------------------------------- Multi-Disciplinary Care Plan Details Patient Name: Date of Service: SKYLAR, MAROLDA [name]. 02/17/2023 1:15 PM Medical Record Number: [id number] Patient Account Number: [id number] Date of Birth/Sex: Treating RN: April 30, 1980 (42 y.o. [name] Primary Care Jasmynn Pfalzgraf: Delynn Flavin Other Clinician: Referring Crickett Abbett: Treating Lelani Garnett/Extender: [name] Weeks in Treatment:  [phone] Multidisciplinary Care Plan reviewed with physician Active Inactive Pressure PERRIN, PETROWSKI [name] ([id number]) [id number] Page 4 of 10 Nursing Diagnoses: Knowledge deficit related to management of pressures ulcers Potential for impaired tissue integrity related to pressure, friction, moisture, and shear Goals: Patient will remain free from development of additional pressure ulcers Date Initiated: 06/27/2016 Date Inactivated: 01/17/2019 Target Resolution Date: 12/24/2018 Goal Status: Met Patient/caregiver will verbalize risk factors for pressure ulcer development Date Initiated: 06/27/2016 Date Inactivated: 07/08/2017 Target Resolution Date: 07/08/2017 Goal Status: Met Patient/caregiver will verbalize understanding of pressure ulcer management Date Initiated: 06/27/2016 Target Resolution Date: 02/27/2023 Goal Status: Active Interventions: Assess: immobility, friction, shearing, incontinence upon admission and as needed Assess offloading mechanisms upon admission and as needed Assess potential for pressure ulcer upon admission and as needed Provide education on pressure ulcers Treatment Activities: Patient referred for pressure reduction/relief devices : 06/27/2016 Pressure reduction/relief device ordered : 06/27/2016 Notes: Wound/Skin Impairment Nursing Diagnoses: Impaired tissue integrity Knowledge deficit related to smoking impact on wound healing Knowledge deficit related to ulceration/compromised skin integrity Goals: Patient will demonstrate [name] reduced rate of smoking or cessation of smoking Date Initiated: 06/27/2016 Date Inactivated: 01/26/2017 Target Resolution Date: 01/08/2017 Goal Status: Unmet Unmet Reason: pt continues to smoke Patient/caregiver will verbalize understanding of skin care regimen Date Initiated: 06/27/2016 Target Resolution Date: 02/27/2023 Goal Status: Active Ulcer/skin  breakdown will have [name] volume reduction of 50% by week  8 Date Initiated: 06/27/2016 Date Inactivated: 12/11/2016 Target Resolution Date: 07/25/2016 Goal Status: Met Interventions: Assess patient/caregiver ability to obtain necessary supplies Assess patient/caregiver ability to perform ulcer/skin care regimen upon admission and as needed Assess ulceration(s) every visit Provide education on smoking Provide education on ulcer and skin care Treatment Activities: Skin care regimen initiated : 06/27/2016 Topical wound management initiated : 06/27/2016 Notes: Electronic Signature(s) Signed: 02/17/2023 5:11:40 PM By: Zenaida Deed RN, BSN Entered By: Zenaida Deed on 02/17/2023 13:56:28 -------------------------------------------------------------------------------- Pain Assessment Details Patient Name: Date of Service: MAHLET, RENZO [name]. 02/17/2023 1:15 PM Medical Record Number: [id number] Patient Account Number: [id number] Date of Birth/Sex: Treating RN: 1980/03/06 (42 y.o. F) Primary Care Ramon Brant: Delynn Flavin Other Clinician: HADDI, HEMPHILL [name] ([id number]) [id number] Page 5 of 10 Referring Vaniya Augspurger: Treating Johann Santone/Extender: [name] Weeks in Treatment: 346 Active Problems Location of Pain Severity and Description of Pain Patient Has Paino Yes Site Locations Pain Location: Generalized Pain With Dressing Change: No Duration of the Pain. Constant / Intermittento Constant Rate the pain. Current Pain Level: 8 Worst Pain Level: 10 Least Pain Level: 0 Tolerable Pain Level: 3 Character of Pain Describe the Pain: Aching Pain Management and Medication Current Pain Management: Medication: Yes How does your wound impact your activities of daily livingo Sleep: No Bathing: No Appetite: No Relationship With Others: No Bladder Continence: No Emotions: No Bowel Continence: No Work: No Toileting: No Drive: No Dressing: No Hobbies: No Electronic Signature(s) Signed:  02/17/2023 5:11:40 PM By: Zenaida Deed RN, BSN Entered By: Zenaida Deed on 02/17/2023 13:46:38 -------------------------------------------------------------------------------- Patient/Caregiver Education Details Patient Name: Date of Service: Ashley Jacobs 12/17/2024andnbsp1:15 PM Medical Record Number: [id number] Patient Account Number: [id number] Date of Birth/Gender: Treating RN: 1980/11/20 (42 y.o. [name] Primary Care Physician: Delynn Flavin Other Clinician: Referring Physician: Treating Physician/Extender: [name] in Treatment: [phone] Education Assessment Education Provided To: Patient Education Topics Provided Pressure: Methods: Explain/Verbal Responses: Reinforcements needed, State content correctly ROSALINDE, MALINOWSKI [name] ([id number]) [phone].pdf Page 6 of 10 Wound/Skin Impairment: Methods: Explain/Verbal Responses: Reinforcements needed, State content correctly Electronic Signature(s) Signed: 02/17/2023 5:11:40 PM By: Zenaida Deed RN, BSN Entered By: Zenaida Deed on 02/17/2023 13:56:56 -------------------------------------------------------------------------------- Wound Assessment Details Patient Name: Date of Service: ROSARY, MANWELL [name]. 02/17/2023 1:15 PM Medical Record Number: [id number] Patient Account Number: [id number] Date of Birth/Sex: Treating RN: [birth year] (42 y.o. F) Primary Care Saban Heinlen: Delynn Flavin Other Clinician: Referring Freada Twersky: Treating Emmani Lesueur/Extender: [name] Weeks in Treatment: 346 Wound Status Wound Number: 1 Primary Etiology: Pressure Ulcer Wound Location: Medial Sacrum Wound Status: Open Wounding Event: Pressure Injury Comorbid History: Anemia, Osteomyelitis Date Acquired: 05/15/2016 Weeks Of Treatment: 346 Clustered Wound: No Photos Wound Measurements Length: (cm) 2.5 Width: (cm) 1.2 Depth: (cm) 1.4 Area:  (cm) 2.356 Volume: (cm) 3.299 % Reduction in Area: 92.8% % Reduction in Volume: 97.7% Epithelialization: Small (1-33%) Tunneling: No Undermining: No Wound Description Classification: Category/Stage IV Wound Margin: Distinct, outline attached Exudate Amount: Medium Exudate Type: Serosanguineous Exudate Color: red, brown Foul Odor After Cleansing: No Slough/Fibrino Yes Wound Bed Granulation Amount: Large (67-100%) Exposed Structure Granulation Quality: Red, Pink Fascia Exposed: No Necrotic Amount: Small (1-33%) Fat Layer (Subcutaneous Tissue) Exposed: Yes Necrotic Quality: Adherent Slough Tendon Exposed: No Muscle Exposed: No Joint Exposed: No Bone Exposed: No Periwound Skin Texture Texture Color Valiente, Bettyjean [name] ([id number]) [id number] Page 7 of 10 No Abnormalities Noted: Yes No Abnormalities Noted: Yes Moisture  Temperature / Pain No Abnormalities Noted: No Temperature: No Abnormality Dry / Scaly: No Maceration: Yes Treatment Notes Wound #1 (Sacrum) Wound Laterality: Medial Cleanser Soap and Water Discharge Instruction: May shower and wash wound with dial antibacterial soap and water prior to dressing change. Vashe 5.8 (oz) Discharge Instruction: Cleanse the wound with Vashe prior to applying [name] clean dressing using gauze sponges, not tissue or cotton balls. Peri-Wound Care Zinc Oxide Ointment 30g tube Discharge Instruction: Apply Zinc Oxide to periwound as needed for moisture Topical Primary Dressing Maxorb Extra Ag+ Alginate Dressing, 2x2 (in/in) Discharge Instruction: Apply to wound bed as instructed Secondary Dressing Zetuvit Plus Silicone Border Dressing 5x5 (in/in) Discharge Instruction: Apply silicone border over primary dressing as directed. Secured With Compression Wrap Compression Stockings Facilities manager) Signed: 02/17/2023 5:11:40 PM By: Zenaida Deed RN, BSN Entered By: Zenaida Deed on 02/17/2023  13:53:21 -------------------------------------------------------------------------------- Wound Assessment Details Patient Name: Date of Service: TOSHIBA, HEIT [name]. 02/17/2023 1:15 PM Medical Record Number: [id number] Patient Account Number: [id number] Date of Birth/Sex: Treating RN: [birth year] (42 y.o. F) Primary Care Rosabella Edgin: Delynn Flavin Other Clinician: Referring Prajwal Fellner: Treating Kimyah Frein/Extender: [name] Weeks in Treatment: 346 Wound Status Wound Number: 18 Primary Etiology: Infection - not elsewhere classified Wound Location: Right T Great oe Wound Status: Open Wounding Event: Blister Comorbid History: Anemia, Osteomyelitis Date Acquired: 09/14/2022 Weeks Of Treatment: 17 Clustered Wound: No Photos ELYANA, CAULLEY [name] ([id number]) [id number] Page 8 of 10 Wound Measurements Length: (cm) 0.1 Width: (cm) 0.1 Depth: (cm) 0.1 Area: (cm) 0.008 Volume: (cm) 0.001 % Reduction in Area: 96.6% % Reduction in Volume: 95.8% Epithelialization: Large (67-100%) Tunneling: No Undermining: No Wound Description Classification: Full Thickness Without Exposed Suppor Wound Margin: Flat and Intact Exudate Amount: None Present t Structures Foul Odor After Cleansing: No Slough/Fibrino No Wound Bed Granulation Amount: None Present (0%) Exposed Structure Necrotic Amount: Large (67-100%) Fascia Exposed: No Necrotic Quality: Eschar Fat Layer (Subcutaneous Tissue) Exposed: No Tendon Exposed: No Muscle Exposed: No Joint Exposed: No Bone Exposed: No Periwound Skin Texture Texture Color No Abnormalities Noted: Yes No Abnormalities Noted: Yes Moisture Temperature / Pain No Abnormalities Noted: Yes Temperature: No Abnormality Treatment Notes Wound #18 (Toe Great) Wound Laterality: Right Cleanser Peri-Wound Care Topical Primary Dressing Maxorb Extra Ag+ Alginate Dressing, 2x2 (in/in) Discharge Instruction: Apply to wound  bed as instructed Secondary Dressing Woven Gauze Sponges 2x2 in Discharge Instruction: Apply over primary dressing as directed. Secured With Conforming Stretch Gauze Bandage, Sterile 2x75 (in/in) Discharge Instruction: Secure with stretch gauze as directed. Compression Wrap Compression Stockings Add-Ons Electronic Signature(s) Signed: 02/17/2023 5:11:40 PM By: Zenaida Deed RN, BSN Entered By: Zenaida Deed on 02/17/2023 13:50:16 RILEYANNE, CLINESMITH [name] ([id number]) [id number] Page 9 of 10 -------------------------------------------------------------------------------- Wound Assessment Details Patient Name: Date of Service: JUHEE, COOLEY 02/17/2023 1:15 PM Medical Record Number: [id number] Patient Account Number: [id number] Date of Birth/Sex: Treating RN: 1981/02/23 (42 y.o. F) Primary Care Zykera Abella: Delynn Flavin Other Clinician: Referring Catarino Vold: Treating Lazette Estala/Extender: [name] Weeks in Treatment: 346 Wound Status Wound Number: 19 Primary Etiology: Abrasion Wound Location: Left T Great oe Wound Status: Open Wounding Event: Shear/Friction Comorbid History: Anemia, Osteomyelitis Date Acquired: 01/19/2023 Weeks Of Treatment: 4 Clustered Wound: No Photos Wound Measurements Length: (cm) Width: (cm) Depth: (cm) Area: (cm) Volume: (cm) 0 % Reduction in Area: 100% 0 % Reduction in Volume: 100% 0 Epithelialization: Large (67-100%) 0 Tunneling: No 0 Undermining: No Wound Description Classification: Full Thickness Without Exposed Support Exudate Amount: None Present Structures  Foul Odor After Cleansing: No Slough/Fibrino No Wound Bed Granulation Amount: None Present (0%) Exposed Structure Necrotic Amount: None Present (0%) Fascia Exposed: No Fat Layer (Subcutaneous Tissue) Exposed: No Tendon Exposed: No Muscle Exposed: No Joint Exposed: No Bone Exposed: No Periwound Skin Texture Texture Color No  Abnormalities Noted: No No Abnormalities Noted: No Ecchymosis: Yes Moisture No Abnormalities Noted: Yes Temperature / Pain Temperature: No Abnormality Treatment Notes Wound #19 (Toe Great) Wound Laterality: Left Cleanser Peri-Wound Care Topical Ivelise, Disanti Lailee [name] ([id number]) [id number] Page 10 of 10 Primary Dressing Maxorb Extra Ag+ Alginate Dressing, 2x2 (in/in) Discharge Instruction: Apply to wound bed as instructed Secondary Dressing Woven Gauze Sponges 2x2 in Discharge Instruction: Apply over primary dressing as directed. Secured With Conforming Stretch Gauze Bandage, Sterile 2x75 (in/in) Discharge Instruction: Secure with stretch gauze as directed. Compression Wrap Compression Stockings Add-Ons Electronic Signature(s) Signed: 02/17/2023 5:11:40 PM By: Zenaida Deed RN, BSN Entered By: Zenaida Deed on 02/17/2023 13:49:52 -------------------------------------------------------------------------------- Vitals Details Patient Name: Date of Service: Jamie Rink [name]. 02/17/2023 1:15 PM Medical Record Number: [id number] Patient Account Number: [id number] Date of Birth/Sex: Treating RN: Jul 23, 1980 (42 y.o. F) Primary Care Ernesha Ramone: Delynn Flavin Other Clinician: Referring Wendee Hata: Treating Hanifa Antonetti/Extender: [name] Weeks in Treatment: 346 Vital Signs Time Taken: 01:26 Temperature (F): 98 Height (in): 65 Pulse (bpm): 94 Weight (lbs): 201 Respiratory Rate (breaths/min): 18 Body Mass Index (BMI): 33.4 Blood Pressure (mmHg): 139/97 Reference Range: 80 - 120 mg / dl Electronic Signature(s) Signed: 02/17/2023 5:11:40 PM By: Zenaida Deed RN, BSN Entered By: Zenaida Deed on 02/17/2023 13:45:38

## 2023-02-27 ENCOUNTER — Other Ambulatory Visit: Payer: Self-pay | Admitting: Family Medicine

## 2023-02-27 DIAGNOSIS — F3342 Major depressive disorder, recurrent, in full remission: Secondary | ICD-10-CM

## 2023-02-27 DIAGNOSIS — F411 Generalized anxiety disorder: Secondary | ICD-10-CM

## 2023-03-18 ENCOUNTER — Encounter (HOSPITAL_BASED_OUTPATIENT_CLINIC_OR_DEPARTMENT_OTHER): Payer: Medicaid Other | Attending: General Surgery | Admitting: General Surgery

## 2023-03-18 DIAGNOSIS — L89154 Pressure ulcer of sacral region, stage 4: Secondary | ICD-10-CM | POA: Insufficient documentation

## 2023-03-18 DIAGNOSIS — L97522 Non-pressure chronic ulcer of other part of left foot with fat layer exposed: Secondary | ICD-10-CM | POA: Diagnosis not present

## 2023-03-18 DIAGNOSIS — G8222 Paraplegia, incomplete: Secondary | ICD-10-CM | POA: Insufficient documentation

## 2023-03-19 NOTE — Progress Notes (Signed)
[name], [name] ([id number]) [id number] Page 1 of 10 Visit Report for 03/18/2023 Arrival Information Details Patient Name: Date of Service: [name] 03/18/2023 1:15 PM Medical Record Number: [id number] Patient Account Number: [id number] Date of Birth/Sex: Treating RN: Dec 23, 1980 (43 y.o. F) Primary Care [name]: Delynn Flavin Other Clinician: Referring Janis Cuffe: Treating [name]: [name] in Treatment: 350 Visit Information History Since Last Visit Added or deleted any medications: No Patient Arrived: Wheel Chair Any new allergies or adverse reactions: No Arrival Time: 13:37 Had [name] fall or experienced change in No Accompanied By: self activities of daily living that may affect Transfer Assistance: Michiel Sites Lift risk of falls: Patient Identification Verified: Yes Signs or symptoms of abuse/neglect since last visito No Secondary Verification Process Completed: Yes Hospitalized since last visit: No Patient Requires Transmission-Based Precautions: No Implantable device outside of the clinic excluding No Patient Has Alerts: No cellular tissue based products placed in the center since last visit: Has Dressing in Place as Prescribed: Yes Pain Present Now: No Electronic Signature(s) Signed: 03/18/2023 5:36:28 PM By: Zenaida Deed RN, BSN Entered By: Zenaida Deed on 03/18/2023 14:16:14 -------------------------------------------------------------------------------- Encounter Discharge Information Details Patient Name: Date of Service: [name]. 03/18/2023 1:15 PM Medical Record Number: [id number] Patient Account Number: [id number] Date of Birth/Sex: Treating RN: Mar 12, 1980 (42 y.o. [name] Primary Care [name]: Delynn Flavin Other Clinician: Referring [name]: Treating [name]: [name] in Treatment: 350 Encounter Discharge  Information Items Post Procedure Vitals Discharge Condition: Stable Temperature (F): 97.2 Ambulatory Status: Wheelchair Pulse (bpm): 84 Discharge Destination: Home Respiratory Rate (breaths/min): 18 Transportation: Other Blood Pressure (mmHg): 130/85 Accompanied By: self Schedule Follow-up Appointment: Yes Clinical Summary of Care: Patient Declined Notes transportation service Electronic Signature(s) Signed: 03/18/2023 5:36:28 PM By: Zenaida Deed RN, BSN Entered By: Zenaida Deed on 03/18/2023 15:29:50 [name] ([id number]) [id number] Page 2 of 10 -------------------------------------------------------------------------------- Lower Extremity Assessment Details Patient Name: Date of Service: [name], [name] 03/18/2023 1:15 PM Medical Record Number: [id number] Patient Account Number: [id number] Date of Birth/Sex: Treating RN: 11-Aug-1980 (43 y.o. [name] Primary Care [name]: Delynn Flavin Other Clinician: Referring [name]: Treating [name]: [name] Weeks in Treatment: 350 Edema Assessment Assessed: [Left: No] [Right: No] Edema: [Left: No] [Right: No] Vascular Assessment Pulses: Dorsalis Pedis Palpable: [Left:Yes] [Right:Yes] Extremity colors, hair growth, and conditions: Extremity Color: [Left:Normal] [Right:Normal] Temperature of Extremity: [Left:Warm < 3 seconds] [Right:Warm < 3 seconds] Electronic Signature(s) Signed: 03/18/2023 5:36:28 PM By: Zenaida Deed RN, BSN Entered By: Zenaida Deed on 03/18/2023 14:23:55 -------------------------------------------------------------------------------- Multi Wound Chart Details Patient Name: Date of Service: [name]. 03/18/2023 1:15 PM Medical Record Number: [id number] Patient Account Number: [id number] Date of Birth/Sex: Treating RN: 12-27-1980 (42 y.o. F) Primary Care Kashonda Sarkisyan: Delynn Flavin Other  Clinician: Referring [name]: Treating [name]: [name] Weeks in Treatment: 350 Vital Signs Height(in): 65 Pulse(bpm): 84 Weight(lbs): 201 Blood Pressure(mmHg): 130/85 Body Mass Index(BMI): 33.4 Temperature(F): 97.2 Respiratory Rate(breaths/min): 18 [1:Photos:] Medial Sacrum Right T Great oe Left T Great oe Wound Location: Pressure Injury Blister Shear/Friction Wounding Event: Pressure Ulcer Infection - not elsewhere classified Abrasion Primary Etiology: Anemia, Osteomyelitis Anemia, Osteomyelitis Anemia, Osteomyelitis Comorbid History: [name] [name] ([id number]) [id number] Page 3 of 10 05/15/2016 09/14/2022 01/19/2023 Date [name] cquired: 350 21 8 Weeks of Treatment: Open Open Open Wound Status: No No No Wound Recurrence: 2.3x1.2x0.4 0x0x0 0.2x0.2x0.1 Measurements L x W x D (cm) 2.168 0 0.031 [name] (cm) : rea 0.867  0 0.003 Volume (cm) : 93.40% 100.00% 56.30% % Reduction in [name] rea: 99.40% 100.00% 57.10% % Reduction in Volume: 5 Starting Position 1 (o'clock): 5 Ending Position 1 (o'clock): 1 Maximum Distance 1 (cm): Yes No No Undermining: Category/Stage IV Full Thickness Without Exposed Full Thickness Without Exposed Classification: Support Structures Support Structures Medium None Present Small Exudate [name] mount: Serosanguineous N/[name] Serosanguineous Exudate Type: red, brown N/[name] red, brown Exudate Color: Distinct, outline attached N/[name] Flat and Intact Wound Margin: Large (67-100%) None Present (0%) Small (1-33%) Granulation [name] mount: Red, Pink N/[name] Red Granulation Quality: Small (1-33%) None Present (0%) None Present (0%) Necrotic [name] mount: Fat Layer (Subcutaneous Tissue): Yes Fascia: No Fascia: No Exposed Structures: Fascia: No Fat Layer (Subcutaneous Tissue): No Fat Layer (Subcutaneous Tissue): No Tendon: No Tendon: No Tendon: No Muscle: No Muscle: No Muscle: No Joint: No Joint:  No Joint: No Bone: No Bone: No Bone: No Small (1-33%) Large (67-100%) Large (67-100%) Epithelialization: Debridement - Excisional N/[name] N/[name] Debridement: Pre-procedure Verification/Time Out 14:20 N/[name] N/[name] Taken: Lidocaine 4% Topical Solution N/[name] N/[name] Pain Control: Subcutaneous, Slough N/[name] N/[name] Tissue Debrided: Skin/Subcutaneous Tissue N/[name] N/[name] Level: 2.17 N/[name] N/[name] Debridement [name] (sq cm): rea Curette N/[name] N/[name] Instrument: Minimum N/[name] N/[name] Bleeding: Pressure N/[name] N/[name] Hemostasis [name] chieved: 0 N/[name] N/[name] Procedural Pain: 0 N/[name] N/[name] Post Procedural Pain: Procedure was tolerated well N/[name] N/[name] Debridement Treatment Response: 2.3x1.2x0.4 N/[name] N/[name] Post Debridement Measurements L x W x D (cm) 0.867 N/[name] N/[name] Post Debridement Volume: (cm) Category/Stage IV N/[name] N/[name] Post Debridement Stage: Rash: No No Abnormalities Noted No Abnormalities Noted Periwound Skin Texture: Maceration: Yes No Abnormalities Noted No Abnormalities Noted Periwound Skin Moisture: Dry/Scaly: No No Abnormalities Noted No Abnormalities Noted Ecchymosis: Yes Periwound Skin Color: No Abnormality No Abnormality No Abnormality Temperature: Debridement N/[name] N/[name] Procedures Performed: Treatment Notes Electronic Signature(s) Signed: 03/18/2023 2:37:49 PM By: Duanne Guess MD FACS Entered By: Duanne Guess on 03/18/2023 14:37:48 -------------------------------------------------------------------------------- Multi-Disciplinary Care Plan Details Patient Name: Date of Service: [name]. 03/18/2023 1:15 PM Medical Record Number: [id number] Patient Account Number: [id number] Date of Birth/Sex: Treating RN: 07/18/1980 (42 y.o. [name] Primary Care Rhianna Raulerson: Delynn Flavin Other Clinician: Referring Mekiah Cambridge: Treating Martavius Lusty/Extender: [name] Weeks in Treatment: 350 Multidisciplinary Care Plan reviewed with physician Active Inactive DELORES, BENEGAS [name] ([id number])  [id number] Page 4 of 10 Pressure Nursing Diagnoses: Knowledge deficit related to management of pressures ulcers Potential for impaired tissue integrity related to pressure, friction, moisture, and shear Goals: Patient will remain free from development of additional pressure ulcers Date Initiated: 06/27/2016 Date Inactivated: 01/17/2019 Target Resolution Date: 12/24/2018 Goal Status: Met Patient/caregiver will verbalize risk factors for pressure ulcer development Date Initiated: 06/27/2016 Date Inactivated: 07/08/2017 Target Resolution Date: 07/08/2017 Goal Status: Met Patient/caregiver will verbalize understanding of pressure ulcer management Date Initiated: 06/27/2016 Target Resolution Date: 04/03/2023 Goal Status: Active Interventions: Assess: immobility, friction, shearing, incontinence upon admission and as needed Assess offloading mechanisms upon admission and as needed Assess potential for pressure ulcer upon admission and as needed Provide education on pressure ulcers Treatment Activities: Patient referred for pressure reduction/relief devices : 06/27/2016 Pressure reduction/relief device ordered : 06/27/2016 Notes: Wound/Skin Impairment Nursing Diagnoses: Impaired tissue integrity Knowledge deficit related to smoking impact on wound healing Knowledge deficit related to ulceration/compromised skin integrity Goals: Patient will demonstrate [name] reduced rate of smoking or cessation of smoking Date Initiated: 06/27/2016 Date Inactivated: 01/26/2017 Target Resolution Date: 01/08/2017 Goal Status: Unmet Unmet Reason: pt continues to smoke Patient/caregiver will verbalize  understanding of skin care regimen Date Initiated: 06/27/2016 Target Resolution Date: 04/03/2023 Goal Status: Active Ulcer/skin breakdown will have [name] volume reduction of 50% by week 8 Date Initiated: 06/27/2016 Date Inactivated: 12/11/2016 Target Resolution Date: 07/25/2016 Goal Status:  Met Interventions: Assess patient/caregiver ability to obtain necessary supplies Assess patient/caregiver ability to perform ulcer/skin care regimen upon admission and as needed Assess ulceration(s) every visit Provide education on smoking Provide education on ulcer and skin care Treatment Activities: Skin care regimen initiated : 06/27/2016 Topical wound management initiated : 06/27/2016 Notes: Electronic Signature(s) Signed: 03/18/2023 5:36:28 PM By: Zenaida Deed RN, BSN Entered By: Zenaida Deed on 03/18/2023 14:23:05 Pain Assessment Details -------------------------------------------------------------------------------- Letitia Libra ([id number]) [id number] Page 5 of 10 Patient Name: Date of Service: [name], [name] 03/18/2023 1:15 PM Medical Record Number: [id number] Patient Account Number: [id number] Date of Birth/Sex: Treating RN: March 22, 1980 (43 y.o. F) Primary Care Raylee Adamec: Delynn Flavin Other Clinician: Referring Tallulah Hosman: Treating Storie Heffern/Extender: [name] Weeks in Treatment: 350 Active Problems Location of Pain Severity and Description of Pain Patient Has Paino No Site Locations Rate the pain. Current Pain Level: 0 Pain Management and Medication Current Pain Management: Electronic Signature(s) Signed: 03/18/2023 5:36:28 PM By: Zenaida Deed RN, BSN Entered By: Zenaida Deed on 03/18/2023 14:16:26 -------------------------------------------------------------------------------- Patient/Caregiver Education Details Patient Name: Date of Service: [name] 1/15/2025andnbsp1:15 PM Medical Record Number: [id number] Patient Account Number: [id number] Date of Birth/Gender: Treating RN: Nov 20, 1980 (43 y.o. [name] Primary Care Physician: Delynn Flavin Other Clinician: Referring Physician: Treating Physician/Extender: [name] in Treatment:  350 Education Assessment Education Provided To: Patient Education Topics Provided Pressure: Methods: Explain/Verbal Responses: Reinforcements needed, State content correctly Wound/Skin Impairment: Methods: Explain/Verbal Responses: Reinforcements needed, State content correctly Electronic Signature(s) Signed: 03/18/2023 5:36:28 PM By: Zenaida Deed RN, BSN [place], Hisae [name] ([id number]) By: Zenaida Deed RN, BSN [phone].pdf Page 6 of 10 Signed: 03/18/2023 5:36:28 PM Entered By: Zenaida Deed on 03/18/2023 14:23:27 -------------------------------------------------------------------------------- Wound Assessment Details Patient Name: Date of Service: [name], [name] 03/18/2023 1:15 PM Medical Record Number: [id number] Patient Account Number: [id number] Date of Birth/Sex: Treating RN: 1980/03/26 (43 y.o. F) Primary Care Sheppard Luckenbach: Delynn Flavin Other Clinician: Referring Zayn Selley: Treating Lus Kriegel/Extender: [name] Weeks in Treatment: 350 Wound Status Wound Number: 1 Primary Etiology: Pressure Ulcer Wound Location: Medial Sacrum Wound Status: Open Wounding Event: Pressure Injury Comorbid History: Anemia, Osteomyelitis Date Acquired: 05/15/2016 Weeks Of Treatment: 350 Clustered Wound: No Photos Wound Measurements Length: (cm) 2.3 Width: (cm) 1.2 Depth: (cm) 0.4 Area: (cm) 2.168 Volume: (cm) 0.867 % Reduction in Area: 93.4% % Reduction in Volume: 99.4% Epithelialization: Small (1-33%) Tunneling: No Undermining: Yes Starting Position (o'clock): 5 Ending Position (o'clock): 5 Maximum Distance: (cm) 1 Wound Description Classification: Category/Stage IV Wound Margin: Distinct, outline attached Exudate Amount: Medium Exudate Type: Serosanguineous Exudate Color: red, brown Foul Odor After Cleansing: No Slough/Fibrino Yes Wound Bed Granulation Amount: Large (67-100%) Exposed Structure Granulation  Quality: Red, Pink Fascia Exposed: No Necrotic Amount: Small (1-33%) Fat Layer (Subcutaneous Tissue) Exposed: Yes Necrotic Quality: Adherent Slough Tendon Exposed: No Muscle Exposed: No Joint Exposed: No Bone Exposed: No Periwound Skin Texture Texture Color No Abnormalities Noted: Yes No Abnormalities Noted: Yes Moisture Temperature / Pain No Abnormalities Noted: No Temperature: No Abnormality Dry / Scaly: No Nilaja, Hoxsie Baker [name] ([id number]) [id number] Page 7 of 10 Maceration: Yes Treatment Notes Wound #1 (Sacrum) Wound Laterality: Medial Cleanser Soap and Water Discharge Instruction: May shower and wash wound with dial antibacterial  soap and water prior to dressing change. Vashe 5.8 (oz) Discharge Instruction: Cleanse the wound with Vashe prior to applying [name] clean dressing using gauze sponges, not tissue or cotton balls. Peri-Wound Care Zinc Oxide Ointment 30g tube Discharge Instruction: Apply Zinc Oxide to periwound as needed for moisture Topical Primary Dressing Maxorb Extra Ag+ Alginate Dressing, 2x2 (in/in) Discharge Instruction: Apply to wound bed as instructed Secondary Dressing Zetuvit Plus Silicone Border Dressing 5x5 (in/in) Discharge Instruction: Apply silicone border over primary dressing as directed. Secured With Compression Wrap Compression Stockings Facilities manager) Signed: 03/18/2023 5:36:28 PM By: Zenaida Deed RN, BSN Entered By: Zenaida Deed on 03/18/2023 14:17:35 -------------------------------------------------------------------------------- Wound Assessment Details Patient Name: Date of Service: [name], [name]. 03/18/2023 1:15 PM Medical Record Number: [id number] Patient Account Number: [id number] Date of Birth/Sex: Treating RN: Jan 19, 1981 (43 y.o. F) Primary Care Xanthe Couillard: Delynn Flavin Other Clinician: Referring Yulissa Needham: Treating Primitivo Merkey/Extender: [name] Weeks  in Treatment: 350 Wound Status Wound Number: 18 Primary Etiology: Infection - not elsewhere classified Wound Location: Right T Great oe Wound Status: Open Wounding Event: Blister Comorbid History: Anemia, Osteomyelitis Date Acquired: 09/14/2022 Weeks Of Treatment: 21 Clustered Wound: No Photos AKARI, VINCENTE [name] ([id number]) [id number] Page 8 of 10 Wound Measurements Length: (cm) Width: (cm) Depth: (cm) Area: (cm) Volume: (cm) 0 % Reduction in Area: 100% 0 % Reduction in Volume: 100% 0 Epithelialization: Large (67-100%) 0 Tunneling: No 0 Undermining: No Wound Description Classification: Full Thickness Without Exposed Support Exudate Amount: None Present Structures Foul Odor After Cleansing: No Slough/Fibrino No Wound Bed Granulation Amount: None Present (0%) Exposed Structure Necrotic Amount: None Present (0%) Fascia Exposed: No Fat Layer (Subcutaneous Tissue) Exposed: No Tendon Exposed: No Muscle Exposed: No Joint Exposed: No Bone Exposed: No Periwound Skin Texture Texture Color No Abnormalities Noted: Yes No Abnormalities Noted: Yes Moisture Temperature / Pain No Abnormalities Noted: Yes Temperature: No Abnormality Electronic Signature(s) Signed: 03/18/2023 5:36:28 PM By: Zenaida Deed RN, BSN Entered By: Zenaida Deed on 03/18/2023 14:19:08 -------------------------------------------------------------------------------- Wound Assessment Details Patient Name: Date of Service: [name]. 03/18/2023 1:15 PM Medical Record Number: [id number] Patient Account Number: [id number] Date of Birth/Sex: Treating RN: [birth year] (43 y.o. F) Primary Care Jadda Hunsucker: Delynn Flavin Other Clinician: Referring Esli Jernigan: Treating Caiya Bettes/Extender: Tomasa Hosteller, Kathie Rhodes Weeks in Treatment: 350 Wound Status Wound Number: 19 Primary Etiology: Abrasion Wound Location: Left T Great oe Wound Status: Open Wounding Event:  Shear/Friction Comorbid History: Anemia, Osteomyelitis Date Acquired: 01/19/2023 Weeks Of Treatment: 8 Clustered Wound: No Photos [name], [name] ([id number]) [id number] Page 9 of 10 Wound Measurements Length: (cm) 0.2 Width: (cm) 0.2 Depth: (cm) 0.1 Area: (cm) 0.031 Volume: (cm) 0.003 % Reduction in Area: 56.3% % Reduction in Volume: 57.1% Epithelialization: Large (67-100%) Tunneling: No Undermining: No Wound Description Classification: Full Thickness Without Exposed Suppor Wound Margin: Flat and Intact Exudate Amount: Small Exudate Type: Serosanguineous Exudate Color: red, brown t Structures Foul Odor After Cleansing: No Slough/Fibrino No Wound Bed Granulation Amount: Small (1-33%) Exposed Structure Granulation Quality: Red Fascia Exposed: No Necrotic Amount: None Present (0%) Fat Layer (Subcutaneous Tissue) Exposed: No Tendon Exposed: No Muscle Exposed: No Joint Exposed: No Bone Exposed: No Periwound Skin Texture Texture Color No Abnormalities Noted: Yes No Abnormalities Noted: No Ecchymosis: Yes Moisture No Abnormalities Noted: Yes Temperature / Pain Temperature: No Abnormality Treatment Notes Wound #19 (Toe Great) Wound Laterality: Left Cleanser Peri-Wound Care Topical Primary Dressing Maxorb Extra Ag+ Alginate Dressing, 2x2 (in/in) Discharge Instruction: Apply to wound bed as instructed Secondary  Dressing Woven Gauze Sponges 2x2 in Discharge Instruction: Apply over primary dressing as directed. Secured With Conforming Stretch Gauze Bandage, Sterile 2x75 (in/in) Discharge Instruction: Secure with stretch gauze as directed. Compression Wrap Compression Stockings Add-Ons Electronic Signature(s) Signed: 03/18/2023 5:36:28 PM By: Zenaida Deed RN, BSN Entered By: Zenaida Deed on 03/18/2023 14:19:40 [name] ([id number]) [id number] Page 10 of  10 -------------------------------------------------------------------------------- Vitals Details Patient Name: Date of Service: [name], [name] 03/18/2023 1:15 PM Medical Record Number: [id number] Patient Account Number: [id number] Date of Birth/Sex: Treating RN: 08-21-1980 (43 y.o. F) Primary Care Miryam Mcelhinney: Delynn Flavin Other Clinician: Referring Orien Mayhall: Treating Winter Jocelyn/Extender: [name] Weeks in Treatment: 350 Vital Signs Time Taken: 01:37 Temperature (F): 97.2 Height (in): 65 Pulse (bpm): 84 Weight (lbs): 201 Respiratory Rate (breaths/min): 18 Body Mass Index (BMI): 33.4 Blood Pressure (mmHg): 130/85 Reference Range: 80 - 120 mg / dl Electronic Signature(s) Signed: 03/18/2023 5:36:28 PM By: Zenaida Deed RN, BSN Entered By: Zenaida Deed on 03/18/2023 14:16:18

## 2023-03-19 NOTE — Progress Notes (Signed)
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 completed her IV antibiotics although I am not exactly sure what IV antibiotics she had ordered. Her PICC line is come out. Her wound is large but generally clean there still is exposed bone. Fortunately the area on the right heel is callused over I cannot prove there is an open area here per 6/22; they are using [name] wet to dry dressing when we left the clinic last time however they managed to find [name] box of silver alginate so they are using that with backing gauze. They are still changing this twice [name] day because of drainage issues. She has Medicaid and they will not be able to replace the want dressing she will have to go back to [name] wet-to-dry. In spite of this the wound actually looks quite good some improvement in dimension 7/13; using silver alginate. Slight improvement in overall wound volume including depth although there is still undermining. She tells me she is offloading this is much as possible 8/10-Patient back at 1 month, continuing to use silver alginate although she has run out and therefore using saline wet-to-dry, undermining is minimal, continuing attempts at offloading according to patient 9/21; its been about 9 [name] since I have seen this patient although I note she was seen once in August. Her wounds on the lower sacrum this looks [name] lot better than the last time I saw this. She is using silver alginate  to the wound bed. They only have Medicaid therefore they have to need to pay out-of-pocket for the dressing supplies. This certainly limits options. 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 10/19; 1 month follow-up. Not much change in the wound in fact it is measuring slightly larger. Still using silver alginate which her mother is purchasing off [employer] I believe. Since she was here she had [name] suprapubic catheter placed because of [name] lacerated urethra. 11/30; patient has not been here in about 6 [name]. She apparently has had 3 admissions to the hospital for pneumonia in the last 7 months 2 in the last 2 months. She came out of hospital this time with 4 L of oxygen. Her mother is using the Anasept wet-to-dry to the sacral wound and surprisingly this looks somewhat better. The patient states she is pending 24/7 in bed except for going to doctor's appointments this may be helping. She has an appointment with pulmonology on 12/17. She was [name] heavy smoker for [name] period of time I wonder whether she has COPD 12/28; patient is doing well she is off her oxygen which may have something to do with chronic Macrobid she was on [hypersensitivity pneumonitis]. She is also stop smoking. In any case she is doing well from [name] breathing point of view. She is using Anasept wet-to-dry. 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 She has been using Anasept gel wet-to-dry and really has done [name] nice job here. She does not have insurance for other wound care products but truthfully so far this is done [name] really good job. I note she is back on oxygen. 2/22; stage IV wound on her lower sacrum. They have been to using an Anasept gel wet-to-dry-based dressing. Ordinarily I would like to use [name] moistened collagen wet-to-dry but she is apparently not able to afford this. There was also some suggestion about using Dakin's but apparently her mother could not make 3/14; 3-week follow-up. She is going for bladder surgery at [facility] next week. Still using Anasept gel  wet-to-dry. We will try to get Prisma wet to dry through what I think is [facility]. This probably will not work 4/19; 4-week follow-up. She is using collagen with wet-to-dry dressings every day. She reports improvement to the wound healing. Patient had bladder neck surgery with suprapubic catheter placement on 3/22. On 3/30 she was sent to the emergency department because of hypotension. She was found to be in septic shock in the setting of ESBL E. coli UTI. Currently she is doing well. 5/17; 4-week follow-up. She is using collagen with backing wet-to-dry. Really nice improvement in surface area of these wounds depth at 1.2 cm. She has had problems with urinary leakage. She does have [name] suprapubic catheter 6/14; 4-week follow-up. She was doing really well the last time we saw her in the wound had filled in quite [name] bit. However since that time her wound is gotten [name] lot deeper. Apparently her bladder surgery failed and she is incontinent [name] lot of the time. Furthermore her mother suffered [name] stroke and is staying with her sister. She now has care attendance but I am not really certain about the amount of care she has. We have been using silver collagen but apparently the family has to buy this privately. 6/28; the wound measures slightly larger I certainly do not see  the difference. It has 1.5 cm of direct depth but not exposed to bone it does not appear to be infected. We had her using silver alginate although she does not seem to know what her mother's been dressing this with recently 7/12; 2-week follow-up. 1.7 mm of direct depth this is fluctuated [name] fair amount but I do not see any consistent trend towards closure. The tissue looks healthy minimal undermining no palpable bone. No evidence of surrounding infection. We have been using silver alginate wet-to-dry although the patient wants to go back to Anasept gel wet to dry 8/30; we have seen this patient in quite [name] while however her wound has  come in nicely at roughly 0.8 or 0.9 mm of direct depth also down in length and width. She is using Anasept wet-to-dry her mother is changing the dressing 9/27; 1 month follow-up. Since she is being here last she apparently has had an attempt to close her urethra by urology in [facility] this was temporarily successful but now is reopened.. We have been using Anasept wet-to-dry. Her variant of Medicaid will not pay for wound care supplies. Most of what the use is being paid for out of their own pocket 10/25; 1 month follow-up. Her wound is measuring better she is still using Anasept wet-to-dry. Her mother changes this twice [name] day because of drainage. Overall the wound dimensions are better. The patient states that her recent urologic procedure actually resulted in less incontinence which may be helping Apparently had her mattress overlay on her bed is disintegrating. She asked if I be willing to put in an order for replacement I told her we would try her best although I am not exactly sure how long Medicaid will allow for replacement 11/29; 1 month follow-up. She arrives with her mother who is her primary caregiver. There is still using Anasept wet-to-dry but her mother says because of drainage they are changing this 3 times [name] day. Dimensions are [name] little worse or as last time she was actually [name] little better 12/13; the patient's wound had deteriorated last time. I did [name] culture of the wound that showed of few rare Pseudomonas and [name] few rare Corynebacterium. The Corynebacterium is likely [name] skin contaminant. I did prescribe topical gentamicin under the silver alginate directed at the Pseudomonas. Her mother says that there is [name] lot of drainage she changes the odor gauze packing 3 times [name] day currently using 6 gentamicin under silver alginate covering all of this with ABDs 03/13/2021; patient is using silver alginate. Very little change in this wound this actually goes down to bone with [name] rim of tissue  separating environment from underlying sacral bone. There is no real granulation. At the base of this. She has not been systemically unwell. The wound itself not much changed however it abuts right on bone. She has Medicaid which does not give [place] [name] lot of options for either home care or different [name], [name] ([id number]) [phone].pdf Page 5 of 15 dressings. We thought this over in some detail. We might be able to get [name] wound VAC through Medicaid but we would not be able to get this changed by home health. She lives in [facility] [facility] [place] which she would be [name] far drive to this clinic twice [name] week. We might be able to teach the daughter or friend how to do this but there is [name] lot of issues that need to be worked through before we put this into the final ordering  status 03/28/2021; the wound actually looks somewhat better contracted. There is still some undermining but no evidence of infection. We have been using gentamicin and silver alginate. 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 She is apparently going for some form of tendon release next week by orthopedics. Her knees are fixed in extension right leg first apparently 2/16; the wound looks clean not much change in dimensions. She has undermining at roughly 4:00. There is no exposed bone. We are using silver alginate with underlying gentamicin. The last culture I did of this in December showed Pseudomonas which is the reason for the gentamicin topical Since patient was last here she had quadricep tendon release surgery at [facility] 05/09/2021: There has been improvement overall in the wound. The base is fairly clean with minimal slough. The size has contracted.[name] Unfortunately, one of her wounds from her quadricep tendon release is open and there is extensive nonviable tissue. She reports that she is going to have to have this operatively debrided. 06/06/2021: Since her  last visit in clinic, she underwent debridement of the open wound from her quadricep tendon release with placement of Kerecis and primary closure. Her sutures have been removed and she saw her surgeon last week. In the interim, [name] clip from her suprapubic catheter caused [name] new wound to occur on her right thigh and her old left heel pressure ulcer has reopened. Her sacral wound is smaller. None of the wounds appears infected, but there is epibole around the sacral wound and fairly thick slough on the heel wound. 06/20/2021: The heel ulcer is clean with good granulation tissue. The sacral wound is smaller but measures [name] little deeper today. There is heaped up senescent skin around the edges of the sacral wound. The wound on her thigh is [name] little bit bigger but remains fairly superficial. 07/04/2021: The heel ulcer is nearly completely healed. Very little open tissue. The sacral wound continues to contract around the cavity. There is [name] lot of heaped up senescent tissue around the edges of the wound. The wound on her thigh has not really made much improvement at all. It remains superficial but has [name] thick layer of yellow slough. No concern for infection in any of the wound sites. 07/18/2021: The heel ulcer has closed. The sacral wound is shallower with [name] little bit of slough in the wound base. There is less senescent tissue around the perimeter. The wound on her thigh looks like it might be [name] little deeper in the center. It continues to have [name] fibrous slough layer. No concern for infection. 07/25/2021: We initiated wound VAC therapy last week. The sacral wound is smaller but still has some undermining. There is just [name] little bit of slough in the wound base. The wound on her thigh we began treating with Santyl. The fibrinous slough has diminished and there is some granulation tissue beginning to form. 08/01/2021: For some reason, the sacral wound measures deeper today. It is fairly clean and there is no significant  odor from the wound, but the patient's mother reports an increase in the drainage. She has struggled with maintaining [name] good seal with the drape, given the location of the wound. On the other hand, the thigh wound is smaller and shallower. There is just some fibrinous slough on the surface. 08/09/2021: The patient was hospitalized last week with [name] urinary tract infection. She is still feeling [name] bit poorly today. She is currently taking [name]  course of oral antibiotics. The sacral wound is [name] little bit smaller today. It does have [name] layer of slough on the surface. The wound on her thigh continues to contract and is fairly superficial today. There is fibrinous slough at the site, as well. 08/23/2021: The patient was hospitalized again this last week with sepsis. It sounds like urology is planning to perform [name] cystectomy with ileal conduit to help with her urinary leakage issues. She apparently developed [name] cutaneous fungal infection where her wound VAC drape was and so that has been removed and her mom is doing wet-to-dry dressing changes. The periwound skin is now healing, but it is still fairly red and irritated. She has [name] deep tissue injury on the same heel that we had previously treated for an open ulcer, but there is currently no skin opening. The wound on her thigh is very superficial and clean without any slough accumulation. 08/30/2021: The heel looks good today and has not opened. The wound on her thigh is closed. The sacral wound looks better, particularly the periwound skin. Her mother has been applying [name] mixture of Desitin and miconazole to the periwound and just doing wet-to-dry dressings because we did not want to further irritate the skin with the wound VAC drape. The wound is clean with just [name] little bit of superficial slough and senescent skin heaped up around the margin. 09/05/2021: The periwound skin at her sacral ulcer site is markedly improved. Immediately surrounding the wound orifice, there is  some thick, dried, and scaly skin. There is also some slough buildup on the wound surface. No odor or significant drainage. 7/14; the patient still has the same depth however surrounding granulation looks better. Using wound VAC with topical Keystone antibiotics. She may be eligible for an improved mattress cover and we will look into it 10/11/2021: The wound is [name] little bit deeper today. She is struggling with urinary contamination of her sacral wound and has subsequently developed maceration and some tissue breakdown. No obvious external infection. There is good granulation tissue on the surface with just [name] little bit of light slough. 10/25/2021: The wound looks better today. The depth has come in by over [name] centimeter. Her skin is in better condition. There is heaped up senescent skin around the wound perimeter. She received her level 3 bed and is very happy with it. 11/08/2021: The undermining has come in substantially. The overall wound depth is about the same. She continues to build up senescent skin around the wound perimeter. 11/22/2021: The wound apparently measured [name] little bit deeper, but it no longer undermines much at all. It is clean. There is senescent skin accumulation around the perimeter, as usual. 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 This was complicated by sepsis secondary to pneumonia. Her wound actually looks quite good. The size is about the same, but the depth has come in considerably and there is less undermining. It is [name] little bit dry around the edges. No concern for infection. 03/17/2022: The patient has been in and out of the hospital since her last visit, primarily with episodes of sepsis related to urinary infections. They have been having issues with her urostomy bags leaking having issues and the patient's mother is concerned that some of the contaminated  urine may be reaching her sacral ulcer, which has enlarged since the last time we saw her. On inspection, the wound is [name] little larger, but  it is very clean with just some slough and accumulation of senescent skin around the margins. 04/16/2022: No real change to the sacral ulcer. It is clean without any significant slough or other debris accumulation. No concern for infection. 05/22/2022: The patient has been in the hospital and she was not on an appropriate surface for [name] time. She has also had some leakage from her urostomy due to the hospital not having the correct bags. Finally, her air-fluidized mattress has been leaking and was only just repaired. As result of these issues, her sacral ulcer is [name] little bit bigger, but it remains quite clean with good granulation tissue at the base. 06/19/2022: Her wound is smaller and shallower this week. There is [name] little bit of slough on the surface and some senescent skin around the edges. 07/17/2022: The wound has contracted further and is shallower again today. There is light slough on the wound surface and reaccumulation of the senescent skin around the edges. 08/14/2022: Although the wound measurements have not changed, the cavity has actually contracted quite [name] bit. There is some slough on the surface and Byard, Chaquetta [name] ([id number]) [phone].pdf Page 6 of 15 reaccumulation of senescent skin around the wound margins. 09/11/2022: The external wound measurement is smaller but the depth is actually measuring [name] little bit deeper today. She has senescent skin that has reaccumulated around the orifice. 10/16/2022: She has [name] new wound on her right great toe. She apparently developed [name] pustular skin rash and had multiple draining sites; this area is the only remaining wound from that episode. The sacral ulcer is without any undermining, but did measure slightly deeper. She was in the hospital with pneumonia for about [name] week. 11/13/2022:  The great toe wound is quite small and has [name] little bit of slough and eschar on the surface. The sacral ulcer is shallower and narrower. She has not accumulated the senescent skin around the edges and there is no discernible slough on the surface. 12/10/2022: The great toe wound is basically the same. The patient's mother said that it had closed but she apparently bumped or rubbed her toe against something and it reopened [name] bit. The sacral ulcer is the smallest I think I have seen it in ages. There is some senescent skin around the edges and minimal slough on the surface. 01/19/2023: The great toe wound is nearly healed with just [name] small open portion remaining at the tip. She has rubbed [name] new wound open on her opposite great toe; this appears to be secondary to friction potentially on her bed. The sacral ulcer got [name] little bit bigger since her last visit, with some slough and senescent skin reaccumulation. The change in size appears likely secondary to moisture. 02/17/2023: Both toe wounds have [name] thin layer of skin over the top but there is discoloration underneath suggesting that they remain open. The sacral ulcer measured slightly deeper; they had some difficulties with her air-fluidized bed that were only just resolved this past Friday. There is senescent skin heaped up around the edges of the sacral wound, as usual. 03/18/2023: The right toe wound is healed. The left toe wound is very superficial and clean. Her sacral wound is substantially shallower than it was at her last visit. She does have [name] layer of slough on the surface and the senescent skin has reaccumulated around the edges. Electronic Signature(s) Signed: 03/18/2023 2:38:45 PM By: Jamie Guess MD FACS Entered By: [name] on 03/18/2023 14:38:44 -------------------------------------------------------------------------------- Physical Exam Details Patient Name:  Date of Service: [name], HOUFEK 03/18/2023 1:15 PM Medical  Record Number: [id number] Patient Account Number: [id number] Date of Birth/Sex: Treating RN: [birth year] (43 y.o. F) Primary Care Provider: Delynn [name] Other Clinician: Referring Provider: Treating Provider/Extender: [name], [name] Constitutional . . . . no acute distress. Respiratory Normal work of breathing on room air.. Notes 03/18/2023: The right toe wound is healed. The left toe wound is very superficial and clean. Her sacral wound is substantially shallower than it was at her last visit. She does have [name] layer of slough on the surface and the senescent skin has reaccumulated around the edges. Electronic Signature(s) Signed: 03/18/2023 2:40:16 PM By: Jamie Guess MD FACS Entered By: [name] on 03/18/2023 14:40:16 -------------------------------------------------------------------------------- Physician Orders Details Patient Name: Date of Service: ANAIKA, WIGGERS [name]. 03/18/2023 1:15 PM Medical Record Number: [id number] Patient Account Number: [id number] Date of Birth/Sex: Treating RN: 10/26/1980 (42 y.o. 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 - Dr. Lady Gary Room 1 ***HOYER*** Anesthetic (In clinic) Topical  Lidocaine 4% applied to wound bed Bathing/ Shower/ Hygiene May shower and wash wound with soap and water. Off-Loading [name] fluidized (Group 3) mattress - medical modalities ir Turn and reposition every 2 hours Wound Treatment Wound #1 - Sacrum Wound Laterality: Medial Cleanser: Soap and Water 1 x Per Day/30 Days Discharge Instructions: May shower and wash wound with dial antibacterial soap and water prior to dressing change. Cleanser: Vashe 5.8 (oz) 1 x Per Day/30 Days Discharge Instructions: Cleanse the wound with Vashe prior to applying [name] clean dressing using gauze sponges, not tissue or cotton balls. Peri-Wound Care: Zinc Oxide Ointment 30g tube 1 x Per Day/30 Days Discharge Instructions: Apply Zinc Oxide to periwound as needed for moisture Prim Dressing: Maxorb Extra Ag+ Alginate Dressing, 2x2 (in/in) 1 x Per Day/30 Days ary Discharge Instructions: Apply to wound bed as instructed Secondary Dressing: Zetuvit Plus Silicone Border Dressing 5x5 (in/in) 1 x Per Day/30 Days Discharge Instructions: Apply silicone border over primary dressing as directed. Wound #19 - T Great oe Wound Laterality: Left Prim Dressing: Maxorb Extra Ag+ Alginate Dressing, 2x2 (in/in) Every Other Day/30 Days ary Discharge Instructions: Apply to wound bed as instructed Secondary Dressing: Woven Gauze Sponges 2x2 in Every Other Day/30 Days Discharge Instructions: Apply over primary dressing as directed. Secured With: Insurance underwriter, Sterile 2x75 (in/in) Every Other Day/30 Days Discharge Instructions: Secure with stretch gauze as directed. Electronic Signature(s) Signed: 03/18/2023 4:06:15 PM By: Jamie Guess MD FACS Entered By: [name] on 03/18/2023 14:40:33 -------------------------------------------------------------------------------- Problem List Details Patient Name: Date of Service: [name], [name]. 03/18/2023 1:15 PM Medical Record Number: [id number] Patient Account  Number: [id number] Date of Birth/Sex: Treating RN: 1980-03-12 (43 y.o. [name] Primary Care Provider: Delynn [name] Other Clinician: Referring Provider: Treating Provider/Extender: Jamie Morin Drew, Lennyn [name] ([id number]) [id number] Page 8 of 15 [name] in Treatment: [name] Active Problems  ICD-10 Encounter Code Description Active Date MDM Diagnosis L89.154 Pressure ulcer of sacral region, stage 4 06/27/2016 No Yes L97.522 Non-pressure chronic ulcer of other part of left foot with fat layer exposed 01/19/2023 No Yes G82.22 Paraplegia, incomplete 06/27/2016 No Yes Inactive Problems ICD-10 Code Description Active Date Inactive Date L97.312 Non-pressure chronic ulcer of right 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 01/26/2017 01/26/2017 L97.322 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 lot better than the last time I saw this. She is using silver alginate to the wound bed. They only have Medicaid therefore they have to need to pay out-of-pocket for the dressing supplies. This certainly limits options. 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 10/19; 1 month follow-up. Not much change in the wound in fact it is measuring slightly larger. Still using silver alginate which her mother is purchasing off [employer] I believe. Since she was here she had [name] suprapubic catheter placed because of [name] lacerated urethra. 11/30; patient has  not been here in about 6 [name]. She apparently has had 3 admissions to the hospital for pneumonia in the last 7 months 2 in the last  2 months. She came out of hospital this time with 4 L of oxygen. Her mother is using the Anasept wet-to-dry to the sacral wound and surprisingly this looks somewhat better. The patient states she is pending 24/7 in bed except for going to doctor's appointments this may be helping. She has an appointment with pulmonology on 12/17. She was [name] heavy smoker for [name] period of time I wonder whether she has COPD 12/28; patient is doing well she is off her oxygen which may have something to do with chronic Macrobid she was on [hypersensitivity pneumonitis]. She is also stop smoking. In any case she is doing well from [name] breathing point of view. She is using Anasept wet-to-dry. 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 She has been using Anasept gel wet-to-dry and really has done [name] nice job here. She does not have insurance for other wound care products but truthfully so far this is done [name] really good job. I note she is back on oxygen. 2/22; stage IV wound on her lower sacrum. They have been to using an Anasept gel wet-to-dry-based dressing. Ordinarily I would like to use [name] moistened collagen wet-to-dry but she is apparently not able to afford this. There was also some suggestion about using Dakin's but apparently her mother could not make 3/14; 3-week follow-up. She is going for bladder surgery at [facility] next week. Still using Anasept gel wet-to-dry. We will try to get Prisma wet to dry through what I think is [facility]. This probably will not work 4/19; 4-week follow-up. She is using collagen with wet-to-dry dressings every day. She reports improvement to the wound healing. Patient had bladder neck surgery with suprapubic catheter placement on 3/22. On 3/30 she was sent to the emergency department because of hypotension. She was found to be in septic shock in the setting of ESBL E. coli UTI. Currently she is doing well. 5/17; 4-week  follow-up. She is using collagen with backing wet-to-dry. Really nice improvement in surface area of these wounds depth at 1.2 cm. She has had problems with urinary leakage. She does have [name] suprapubic catheter 6/14; 4-week follow-up. She was doing really well the last time we saw her in the wound had filled in quite [name] bit. However since that time her wound is gotten [name] lot deeper. Apparently her bladder surgery failed and she is incontinent [name] lot of the time. Furthermore her mother suffered [name] stroke and is staying with her sister. She now has care attendance but I am not really certain about the amount of care she has. We have been using silver collagen but apparently the family has to buy this privately. 6/28; the wound measures slightly larger I certainly do not see the difference. It has 1.5 cm of direct depth but not exposed to bone it does not appear to be infected. We had her using silver alginate although she does not seem to know what her mother's been dressing this with recently 7/12; 2-week follow-up. 1.7 mm of direct depth this is fluctuated [name] fair amount but I do not see any consistent trend towards closure. The tissue looks healthy minimal undermining no palpable bone. No evidence of surrounding infection. We have been using silver alginate wet-to-dry although the patient wants to  go back to Anasept gel wet to dry 8/30; we have seen this patient in quite [name] while however her wound has come in nicely at roughly 0.8 or 0.9 mm of direct depth also down in length and width. She is using Anasept wet-to-dry her mother is changing the dressing 9/27; 1 month follow-up. Since she is being here last she apparently has had an attempt to close her urethra by urology in [facility] this was temporarily successful but now is reopened.. We have been using Anasept wet-to-dry. Her variant of Medicaid will not pay for wound care supplies. Most of what the use is being paid for out of their own pocket 10/25;  1 month follow-up. Her wound is measuring better she is still using Anasept wet-to-dry. Her mother changes this twice [name] day because of drainage. Overall the wound dimensions are better. The patient states that her recent urologic procedure actually resulted in less incontinence which may be helping Apparently had her mattress overlay on her bed is disintegrating. She asked if I be willing to put in an order for replacement I told her we would try her best although I am not exactly sure how long Medicaid will allow for replacement 11/29; 1 month follow-up. She arrives with her mother who is her primary caregiver. There is still using Anasept wet-to-dry but her mother says because of drainage they are changing this 3 times [name] day. Dimensions are [name] little worse or as last time she was actually [name] little better 12/13; the patient's wound had deteriorated last time. I did [name] culture of the wound that showed of few rare Pseudomonas and [name] few rare Corynebacterium. The Corynebacterium is likely [name] skin contaminant. I did prescribe topical gentamicin under the silver alginate directed at the Pseudomonas. Her mother says that there is [name] lot of drainage she changes the odor gauze packing 3 times [name] day currently using 6 gentamicin under silver alginate covering all of this with ABDs 03/13/2021; patient is using silver alginate. Very little change in this wound this actually goes down to bone with [name] rim of tissue separating environment from underlying sacral bone. There is no real granulation. At the base of this. She has not been systemically unwell. The wound itself not much changed however it abuts right on bone. She has Medicaid which does not give [place] [name] lot of options for either home care or different dressings. We thought this over in some detail. We might be able to get [name] wound VAC through Medicaid but we would not be able to get this changed by home health. She lives in [facility] [place] which she would be [name]  far drive to this clinic twice [name] week. We might be able to teach the daughter or friend how to do this but there is [name] lot of issues that need to be worked through before we put this into the final ordering status 03/28/2021; the wound actually looks somewhat better contracted. There is still some undermining but no evidence of infection. We have been using gentamicin and silver alginate. 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 She is apparently going for some form of tendon release next week by orthopedics. Her knees are fixed in extension right leg first apparently 2/16; the wound looks clean not much change in dimensions. She has undermining at roughly 4:00. There is no exposed bone. We are using silver alginate with underlying gentamicin. The last culture I  did of this in December showed Pseudomonas which is the reason for the gentamicin topical Since patient was last here she had quadricep tendon release surgery at [facility], Kerry [name] ([id number]) [id number] Page 12 of 15 05/09/2021: There has been improvement overall in the wound. The base is fairly clean with minimal slough. The size has contracted.[name] Unfortunately, one of her wounds from her quadricep tendon release is open and there is extensive nonviable tissue. She reports that she is going to have to have this operatively debrided. 06/06/2021: Since her last visit in clinic, she underwent debridement of the open wound from her quadricep tendon release with placement of Kerecis and primary closure. Her sutures have been removed and she saw her surgeon last week. In the interim, [name] clip from her suprapubic catheter caused [name] new wound to occur on her right thigh and her old left heel pressure ulcer has reopened. Her sacral wound is smaller. None of the wounds appears infected, but there is epibole around the sacral wound and fairly thick slough on the heel  wound. 06/20/2021: The heel ulcer is clean with good granulation tissue. The sacral wound is smaller but measures [name] little deeper today. There is heaped up senescent skin around the edges of the sacral wound. The wound on her thigh is [name] little bit bigger but remains fairly superficial. 07/04/2021: The heel ulcer is nearly completely healed. Very little open tissue. The sacral wound continues to contract around the cavity. There is [name] lot of heaped up senescent tissue around the edges of the wound. The wound on her thigh has not really made much improvement at all. It remains superficial but has [name] thick layer of yellow slough. No concern for infection in any of the wound sites. 07/18/2021: The heel ulcer has closed. The sacral wound is shallower with [name] little bit of slough in the wound base. There is less senescent tissue around the perimeter. The wound on her thigh looks like it might be [name] little deeper in the center. It continues to have [name] fibrous slough layer. No concern for infection. 07/25/2021: We initiated wound VAC therapy last week. The sacral wound is smaller but still has some undermining. There is just [name] little bit of slough in the wound base. The wound on her thigh we began treating with Santyl. The fibrinous slough has diminished and there is some granulation tissue beginning to form. 08/01/2021: For some reason, the sacral wound measures deeper today. It is fairly clean and there is no significant odor from the wound, but the patient's mother reports an increase in the drainage. She has struggled with maintaining [name] good seal with the drape, given the location of the wound. On the other hand, the thigh wound is smaller and shallower. There is just some fibrinous slough on the surface. 08/09/2021: The patient was hospitalized last week with [name] urinary tract infection. She is still feeling [name] bit poorly today. She is currently taking [name] course of oral antibiotics. The sacral wound is [name] little bit smaller  today. It does have [name] layer of slough on the surface. The wound on her thigh continues to contract and is fairly superficial today. There is fibrinous slough at the site, as well. 08/23/2021: The patient was hospitalized again this last week with sepsis. It sounds like urology is planning to perform [name] cystectomy with ileal conduit to help with her urinary leakage issues. She apparently developed [name] cutaneous fungal infection where her wound VAC drape was and so that  has been removed and her mom is doing wet-to-dry dressing changes. The periwound skin is now healing, but it is still fairly red and irritated. She has [name] deep tissue injury on the same heel that we had previously treated for an open ulcer, but there is currently no skin opening. The wound on her thigh is very superficial and clean without any slough accumulation. 08/30/2021: The heel looks good today and has not opened. The wound on her thigh is closed. The sacral wound looks better, particularly the periwound skin. Her mother has been applying [name] mixture of Desitin and miconazole to the periwound and just doing wet-to-dry dressings because we did not want to further irritate the skin with the wound VAC drape. The wound is clean with just [name] little bit of superficial slough and senescent skin heaped up around the margin. 09/05/2021: The periwound skin at her sacral ulcer site is markedly improved. Immediately surrounding the wound orifice, there is some thick, dried, and scaly skin. There is also some slough buildup on the wound surface. No odor or significant drainage. 7/14; the patient still has the same depth however surrounding granulation looks better. Using wound VAC with topical Keystone antibiotics. She may be eligible for an improved mattress cover and we will look into it 10/11/2021: The wound is [name] little bit deeper today. She is struggling with urinary contamination of her sacral wound and has subsequently developed maceration and some  tissue breakdown. No obvious external infection. There is good granulation tissue on the surface with just [name] little bit of light slough. 10/25/2021: The wound looks better today. The depth has come in by over [name] centimeter. Her skin is in better condition. There is heaped up senescent skin around the wound perimeter. She received her level 3 bed and is very happy with it. 11/08/2021: The undermining has come in substantially. The overall wound depth is about the same. She continues to build up senescent skin around the wound perimeter. 11/22/2021: The wound apparently measured [name] little bit deeper, but it no longer undermines much at all. It is clean. There is senescent skin accumulation around the perimeter, as usual. 01/14/2022: The patient returns to clinic after undergoing extensive surgical intervention at [facility] for her urethral erosion. This was complicated by sepsis secondary to pneumonia. Her wound actually looks quite good. The size is about the same, but the depth has come in considerably and there is less undermining. It is [name] little bit dry around the edges. No concern for infection. 03/17/2022: The patient has been in and out of the hospital since her last visit, primarily with episodes of sepsis related to urinary infections. They have been having issues with her urostomy bags leaking having issues and the patient's mother is concerned that some of the contaminated urine may be reaching her sacral ulcer, which has enlarged since the last time we saw her. On inspection, the wound is [name] little larger, but it is very clean with just some slough and accumulation of senescent skin around the margins. 04/16/2022: No real change to the sacral ulcer. It is clean without any significant slough or other debris accumulation. No concern for infection. 05/22/2022: The patient has been in the hospital and she was not on an appropriate surface for [name] time. She has also had some leakage  from her urostomy due to the hospital not having the correct bags. Finally, her air-fluidized mattress has been leaking and was only just repaired. As result of these  issues, her sacral ulcer is [name] little bit bigger, but it remains quite clean with good granulation tissue at the base. 06/19/2022: Her wound is smaller and shallower this week. There is [name] little bit of slough on the surface and some senescent skin around the edges. 07/17/2022: The wound has contracted further and is shallower again today. There is light slough on the wound surface and reaccumulation of the senescent skin around the edges. 08/14/2022: Although the wound measurements have not changed, the cavity has actually contracted quite [name] bit. There is some slough on the surface and reaccumulation of senescent skin around the wound margins. 09/11/2022: The external wound measurement is smaller but the depth is actually measuring [name] little bit deeper today. She has senescent skin that has reaccumulated around the orifice. 10/16/2022: She has [name] new wound on her right great toe. She apparently developed [name] pustular skin rash and had multiple draining sites; this area is the only remaining wound from that episode. The sacral ulcer is without any undermining, but did measure slightly deeper. She was in the hospital with pneumonia for about [name] week. 11/13/2022: The great toe wound is quite small and has [name] little bit of slough and eschar on the surface. The sacral ulcer is shallower and narrower. She has not accumulated the senescent skin around the edges and there is no discernible slough on the surface. [name], [name] ([id number]) [id number] Page 13 of 15 12/10/2022: The great toe wound is basically the same. The patient's mother said that it had closed but she apparently bumped or rubbed her toe against something and it reopened [name] bit. The sacral ulcer is the smallest I think I have seen it in ages. There is some  senescent skin around the edges and minimal slough on the surface. 01/19/2023: The great toe wound is nearly healed with just [name] small open portion remaining at the tip. She has rubbed [name] new wound open on her opposite great toe; this appears to be secondary to friction potentially on her bed. The sacral ulcer got [name] little bit bigger since her last visit, with some slough and senescent skin reaccumulation. The change in size appears likely secondary to moisture. 02/17/2023: Both toe wounds have [name] thin layer of skin over the top but there is discoloration underneath suggesting that they remain open. The sacral ulcer measured slightly deeper; they had some difficulties with her air-fluidized bed that were only just resolved this past Friday. There is senescent skin heaped up around the edges of the sacral wound, as usual. 03/18/2023: The right toe wound is healed. The left toe wound is very superficial and clean. Her sacral wound is substantially shallower than it was at her last visit. She does have [name] layer of slough on the surface and the senescent skin has reaccumulated around the edges. Objective Constitutional no acute distress. Vitals Time Taken: 1:37 AM, Height: 65 in, Weight: 201 lbs, BMI: 33.4, Temperature: 97.2 F, Pulse: 84 bpm, Respiratory Rate: 18 breaths/min, Blood Pressure: 130/85 mmHg. Respiratory Normal work of breathing on room air.. General Notes: 03/18/2023: The right toe wound is healed. The left toe wound is very superficial and clean. Her sacral wound is substantially shallower than it was at her last visit. She does have [name] layer of slough on the surface and the senescent skin has reaccumulated around the edges. Integumentary (Hair, Skin) Wound #1 status is Open. Original cause of wound was Pressure Injury. The date acquired was: 05/15/2016. The wound has been in treatment  [name] [name]. The wound is located on the Medial Sacrum. The wound measures 2.3cm length x 1.2cm width x 0.4cm  depth; 2.168cm^2 area and 0.867cm^3 volume. There is Fat Layer (Subcutaneous Tissue) exposed. There is no tunneling noted, however, there is undermining starting at 5:00 and ending at 5:00 with [name] maximum distance of 1cm. There is [name] medium amount of serosanguineous drainage noted. The wound margin is distinct with the outline attached to the wound base. There is large (67-100%) red, pink granulation within the wound bed. There is [name] small (1-33%) amount of necrotic tissue within the wound bed including Adherent Slough. The periwound skin appearance had no abnormalities noted for texture. The periwound skin appearance had no abnormalities noted for color. The periwound skin appearance exhibited: Maceration. The periwound skin appearance did not exhibit: Dry/Scaly. Periwound temperature was noted as No Abnormality. Wound #18 status is Open. Original cause of wound was Blister. The date acquired was: 09/14/2022. The wound has been in treatment 21 [name]. The wound is located on the Right T Great. The wound measures 0cm length x 0cm width x 0cm depth; 0cm^2 area and 0cm^3 volume. There is no tunneling or undermining oe noted. There is [name] none present amount of drainage noted. There is no granulation within the wound bed. There is no necrotic tissue within the wound bed. The periwound skin appearance had no abnormalities noted for texture. The periwound skin appearance had no abnormalities noted for moisture. The periwound skin appearance had no abnormalities noted for color. Periwound temperature was noted as No Abnormality. Wound #19 status is Open. Original cause of wound was Shear/Friction. The date acquired was: 01/19/2023. The wound has been in treatment 8 [name]. The wound is located on the Left T Great. The wound measures 0.2cm length x 0.2cm width x 0.1cm depth; 0.031cm^2 area and 0.003cm^3 volume. There is no oe tunneling or undermining noted. There is [name] small amount of serosanguineous drainage noted.  The wound margin is flat and intact. There is small (1-33%) red granulation within the wound bed. There is no necrotic tissue within the wound bed. The periwound skin appearance had no abnormalities noted for texture. The periwound skin appearance had no abnormalities noted for moisture. The periwound skin appearance exhibited: Ecchymosis. Periwound temperature was noted as No Abnormality. 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 There was [name] Excisional Skin/Subcutaneous Tissue Debridement with [name] total area of 2.17 sq cm performed by Jamie Guess, MD. With the following instrument(s): Curette to remove Viable and Non-Viable tissue/material. Material removed includes Subcutaneous Tissue, Slough, and Skin: Epidermis after achieving pain control using Lidocaine 4% T opical Solution. No specimens were taken. [name] time out was conducted at 14:20, prior to the start of the procedure. [name] Minimum amount of bleeding was controlled with Pressure. The procedure was tolerated well with [name] pain level of 0 throughout and [name] pain level of 0 following the procedure. Post Debridement Measurements: 2.3cm length x 1.2cm width x 0.4cm depth; 0.867cm^3 volume. Post debridement Stage noted as Category/Stage IV. SERRITA, DEACY [name] ([id number]) [id number] Page 14 of 15 Character of Wound/Ulcer Post Debridement is improved. Post procedure Diagnosis Wound #1: Same as Pre-Procedure Plan Follow-up Appointments: Return appointment in 1 month. - Dr. Lady Gary Room 1 ***HOYER*** Anesthetic: (In clinic) Topical Lidocaine 4% applied to  wound bed Bathing/ Shower/ Hygiene: May shower and wash wound with soap and water. Off-Loading: Air fluidized (Group 3) mattress  - medical modalities Turn and reposition every 2 hours WOUND #1: - Sacrum Wound Laterality: Medial Cleanser: Soap and Water 1 x Per Day/30 Days Discharge Instructions: May shower and wash wound with dial antibacterial soap and water prior to dressing change. Cleanser: Vashe 5.8 (oz) 1 x Per Day/30 Days Discharge Instructions: Cleanse the wound with Vashe prior to applying [name] clean dressing using gauze sponges, not tissue or cotton balls. Peri-Wound Care: Zinc Oxide Ointment 30g tube 1 x Per Day/30 Days Discharge Instructions: Apply Zinc Oxide to periwound as needed for moisture Prim Dressing: Maxorb Extra Ag+ Alginate Dressing, 2x2 (in/in) 1 x Per Day/30 Days ary Discharge Instructions: Apply to wound bed as instructed Secondary Dressing: Zetuvit Plus Silicone Border Dressing 5x5 (in/in) 1 x Per Day/30 Days Discharge Instructions: Apply silicone border over primary dressing as directed. WOUND #19: - T Great Wound Laterality: Left oe Prim Dressing: Maxorb Extra Ag+ Alginate Dressing, 2x2 (in/in) Every Other Day/30 Days ary Discharge Instructions: Apply to wound bed as instructed Secondary Dressing: Woven Gauze Sponges 2x2 in Every Other Day/30 Days Discharge Instructions: Apply over primary dressing as directed. Secured With: Insurance underwriter, Sterile 2x75 (in/in) Every Other Day/30 Days Discharge Instructions: Secure with stretch gauze as directed. 03/18/2023: The right toe wound is healed. The left toe wound is very superficial and clean. Her sacral wound is substantially shallower than it was at her last visit. She does have [name] layer of slough on the surface and the senescent skin has reaccumulated around the edges. The left toe wound did not require debridement. We will continue to apply silver alginate here. I used [name] curette to debride slough, skin, and subcutaneous tissue from the sacral wound. We will continue to pack the sacral wound with silver alginate. She will  follow-up in 1 month. Electronic Signature(s) Signed: 03/18/2023 2:41:20 PM By: Jamie Guess MD FACS Entered By: [name] on 03/18/2023 14:41:20 -------------------------------------------------------------------------------- SuperBill Details Patient Name: Date of Service: Jamie Rink [name]. 03/18/2023 Medical Record Number: [id number] Patient Account Number: [id number] Date of Birth/Sex: Treating RN: 1980-04-27 (42 y.o. 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 G82.22 Paraplegia, incomplete Facility Procedures : GENAVIE, LOUCK Code: Darely [name] ([id number]) [id number] ICD L8 Description: 43 - DEB SUBQ TISSUE 20 SQ CM/< -10 Diagnosis Description 9.154 Pressure ulcer of sacral region, stage 4 Modifier: [id number] P 1 Quantity: age 63 of 44 Physician Procedures : CPT4 Code Description Modifier [phone] [id number] - WC PHYS LEVEL 4 - EST PT ICD-10 Diagnosis Description L89.154 Pressure ulcer of sacral region, 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

## 2023-03-21 ENCOUNTER — Other Ambulatory Visit: Payer: Self-pay | Admitting: Family Medicine

## 2023-03-21 DIAGNOSIS — Z8639 Personal history of other endocrine, nutritional and metabolic disease: Secondary | ICD-10-CM

## 2023-03-25 ENCOUNTER — Ambulatory Visit (INDEPENDENT_AMBULATORY_CARE_PROVIDER_SITE_OTHER): Payer: Medicaid Other | Admitting: Family Medicine

## 2023-03-25 ENCOUNTER — Encounter: Payer: Self-pay | Admitting: Family Medicine

## 2023-03-25 VITALS — BP 156/97 | HR 99 | Temp 98.5°F | Ht 66.0 in

## 2023-03-25 DIAGNOSIS — G8929 Other chronic pain: Secondary | ICD-10-CM | POA: Diagnosis not present

## 2023-03-25 DIAGNOSIS — T1490XS Injury, unspecified, sequela: Secondary | ICD-10-CM

## 2023-03-25 DIAGNOSIS — N368 Other specified disorders of urethra: Secondary | ICD-10-CM

## 2023-03-25 DIAGNOSIS — I10 Essential (primary) hypertension: Secondary | ICD-10-CM

## 2023-03-25 DIAGNOSIS — K592 Neurogenic bowel, not elsewhere classified: Secondary | ICD-10-CM

## 2023-03-25 DIAGNOSIS — S34101D Unspecified injury to L1 level of lumbar spinal cord, subsequent encounter: Secondary | ICD-10-CM

## 2023-03-25 DIAGNOSIS — Z79899 Other long term (current) drug therapy: Secondary | ICD-10-CM

## 2023-03-25 DIAGNOSIS — J069 Acute upper respiratory infection, unspecified: Secondary | ICD-10-CM

## 2023-03-25 DIAGNOSIS — J302 Other seasonal allergic rhinitis: Secondary | ICD-10-CM

## 2023-03-25 DIAGNOSIS — N319 Neuromuscular dysfunction of bladder, unspecified: Secondary | ICD-10-CM

## 2023-03-25 DIAGNOSIS — G8220 Paraplegia, unspecified: Secondary | ICD-10-CM

## 2023-03-25 DIAGNOSIS — T8389XD Other specified complication of genitourinary prosthetic devices, implants and grafts, subsequent encounter: Secondary | ICD-10-CM

## 2023-03-25 LAB — VERITOR FLU A/B WAIVED
Influenza A: NEGATIVE
Influenza B: NEGATIVE

## 2023-03-25 LAB — RSV AG, IMMUNOCHR, WAIVED: RSV Ag, Immunochr, Waived: NEGATIVE

## 2023-03-25 MED ORDER — LEVOCETIRIZINE DIHYDROCHLORIDE 5 MG PO TABS: 5.0000 mg | ORAL_TABLET | Freq: Every evening | ORAL | 3 refills | Status: DC | PRN

## 2023-03-25 MED ORDER — FLUTICASONE PROPIONATE 50 MCG/ACT NA SUSP: 2.0000 | Freq: Every day | NASAL | 2 refills | Status: DC

## 2023-03-25 MED ORDER — OXYCODONE HCL 10 MG PO TABS: ORAL_TABLET | ORAL | 0 refills | Status: DC

## 2023-03-25 MED ORDER — AMLODIPINE BESYLATE 2.5 MG PO TABS: 2.5000 mg | ORAL_TABLET | Freq: Every day | ORAL | 3 refills | Status: DC

## 2023-03-25 MED ORDER — OXYCODONE HCL 10 MG PO TABS: 10.0000 mg | ORAL_TABLET | Freq: Three times a day (TID) | ORAL | 0 refills | Status: DC

## 2023-03-25 MED ORDER — AZELASTINE HCL 0.1 % NA SOLN: 1.0000 | Freq: Two times a day (BID) | NASAL | 12 refills | Status: AC

## 2023-03-25 MED ORDER — BENZONATATE 100 MG PO CAPS: 100.0000 mg | ORAL_CAPSULE | Freq: Three times a day (TID) | ORAL | 0 refills | Status: DC | PRN
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
## 2023-03-25 NOTE — Patient Instructions (Addendum)
Schedule visit with your Ob/GYN for pap smear.  You are due for this in March.  It appears that you have a viral upper respiratory infection (cold).  Cold symptoms can last up to 2 weeks.  I recommend that you only use cold medications that are safe in high blood pressure like Coricidin (generic is fine).  Other cold medications can increase your blood pressure.    - Get plenty of rest and drink plenty of fluids. - Try to breathe moist air. Use a cold mist humidifier. - Consume warm fluids (soup or tea) to provide relief for a stuffy nose and to loosen phlegm. - For nasal stuffiness, try saline nasal spray or a Neti Pot.  Afrin nasal spray can also be used but this product should not be used longer than 3 days or it will cause rebound nasal stuffiness (worsening nasal congestion). - For sore throat pain relief: use chloraseptic spray, suck on throat lozenges, hard candy or popsicles; gargle with warm salt water (1/4 tsp. salt per 8 oz. of water); and eat soft, bland foods. - Eat a well-balanced diet. If you cannot, ensure you are getting enough nutrients by taking a daily multivitamin. - Avoid dairy products, as they can thicken phlegm. - Avoid alcohol, as it impairs your body's immune system.  CONTACT YOUR DOCTOR IF YOU EXPERIENCE ANY OF THE FOLLOWING: - High fever - Ear pain - Sinus-type headache - Unusually severe cold symptoms - Cough that gets worse while other cold symptoms improve - Flare up of any chronic lung problem, such as asthma - Your symptoms persist longer than 2 weeks

## 2023-03-27 ENCOUNTER — Encounter: Payer: Self-pay | Admitting: Family Medicine

## 2023-04-01 ENCOUNTER — Other Ambulatory Visit: Payer: Self-pay | Admitting: Family Medicine

## 2023-04-02 ENCOUNTER — Telehealth: Payer: Self-pay | Admitting: Gastroenterology

## 2023-04-02 NOTE — Telephone Encounter (Signed)
Inbound call from patient's mom stating they have not heard anything from [facility]. Requesting a call back to discuss further. Please advise, thank you.

## 2023-04-02 NOTE — Telephone Encounter (Signed)
16 pages of records/referral info faxed to [facility]. [name] has been made aware that she should hear from [facility] within the next few days.

## 2023-04-02 NOTE — Telephone Encounter (Signed)
[name]-  Do you have the referral info on this patient still? If not, I am glad to resend info to [facility] (they never got our fax).  Just let me know!

## 2023-04-03 NOTE — Telephone Encounter (Signed)
I also faxed records  Sorry, Thank You Dottie

## 2023-04-08 NOTE — Telephone Encounter (Signed)
 Per [facility], patient has been scheduled for appointment on 05/20/23.

## 2023-04-15 ENCOUNTER — Encounter (HOSPITAL_BASED_OUTPATIENT_CLINIC_OR_DEPARTMENT_OTHER): Payer: Medicaid Other | Attending: General Surgery | Admitting: General Surgery

## 2023-04-15 DIAGNOSIS — G8222 Paraplegia, incomplete: Secondary | ICD-10-CM | POA: Insufficient documentation

## 2023-04-15 DIAGNOSIS — L89154 Pressure ulcer of sacral region, stage 4: Secondary | ICD-10-CM | POA: Insufficient documentation

## 2023-04-15 DIAGNOSIS — L97522 Non-pressure chronic ulcer of other part of left foot with fat layer exposed: Secondary | ICD-10-CM | POA: Insufficient documentation

## 2023-04-21 ENCOUNTER — Ambulatory Visit: Payer: Self-pay | Admitting: Family Medicine

## 2023-04-21 ENCOUNTER — Encounter: Payer: Self-pay | Admitting: Family

## 2023-04-21 ENCOUNTER — Telehealth (INDEPENDENT_AMBULATORY_CARE_PROVIDER_SITE_OTHER): Payer: Medicaid Other | Admitting: Family

## 2023-04-21 DIAGNOSIS — Z20828 Contact with and (suspected) exposure to other viral communicable diseases: Secondary | ICD-10-CM | POA: Diagnosis not present

## 2023-04-21 DIAGNOSIS — R6889 Other general symptoms and signs: Secondary | ICD-10-CM

## 2023-04-21 DIAGNOSIS — S34101D Unspecified injury to L1 level of lumbar spinal cord, subsequent encounter: Secondary | ICD-10-CM | POA: Diagnosis not present

## 2023-04-21 MED ORDER — ONDANSETRON HCL 4 MG PO TABS: 4.0000 mg | ORAL_TABLET | Freq: Three times a day (TID) | ORAL | 0 refills | Status: DC | PRN

## 2023-04-21 MED ORDER — BENZONATATE 200 MG PO CAPS: 200.0000 mg | ORAL_CAPSULE | Freq: Three times a day (TID) | ORAL | 1 refills | Status: DC | PRN

## 2023-04-21 MED ORDER — OSELTAMIVIR PHOSPHATE 75 MG PO CAPS: 75.0000 mg | ORAL_CAPSULE | Freq: Two times a day (BID) | ORAL | 0 refills | Status: DC

## 2023-04-21 NOTE — Telephone Encounter (Signed)
 Appt today

## 2023-04-21 NOTE — Telephone Encounter (Signed)
Copied from CRM [id number]. Topic: Clinical - Red Word Triage >> Apr 21, 2023  8:29 AM Payton Doughty wrote: Red Word that prompted transfer to Nurse Triage: pt h, as fever 102.3, vomiting.  Pt has headache, and coughing.  Mom has the flu.  Pt needs video visit, she is paraplegic.  Chief Complaint: Flu symptoms Symptoms: Fever, cough, chills, vomiting, headache Frequency: Sunday night Pertinent Negatives: Patient denies difficulty breathing Disposition: []ED /[]Urgent Care (no appt availability in office) / [x]Appointment(In office/virtual)/ [] [facility] [facility]/ []Home Care/ []Refused Recommended Disposition /[][facility]/ [] Follow-up with PCP Additional Notes: Spoke to patient\'s mother on behalf of her daughter who has been exposed to the flu and is experiencing symptoms. Patient had a fever of 102.3 this morning. Patient is experiencing coughing spells that are causing her to vomit. Patient is also experiencing chills and a headache. Mother denied difficulty breathing in the patient. Patient is a paraplegic and requested virtual appointment. This RN advised patient to be seen within 4 hours. No availability with PCP. Scheduled same day virtual visit with an alternate provider in the office. Advised patient/mother to call back if anything changes. Patient/mother complied.   Reason for Disposition  [1] Fever > 100.0 F (37.8 C) AND [2] bedridden (e.g., CVA, chronic illness, recovering from surgery)  Answer Assessment - Initial Assessment Questions 1. WORST SYMPTOM: "What is your worst symptom?" (e.g., cough, runny nose, muscle aches, headache, sore throat, fever)      Fever 2. ONSET: "When did your flu symptoms start?"      Sunday night 3. COUGH: "How bad is the cough?"       Coughing spells, mother states she coughs until she pukes 4. RESPIRATORY DISTRESS: "Describe your breathing."      Denies, mother states patient has an inhaler if she needs it 5. FEVER: "Do you have a fever?" If  Yes, ask: "What is your temperature, how was it measured, and when did it start?"     10 2.3 taken orally this morning 6. EXPOSURE: "Were you exposed to someone with influenza?"       Mother has flu and RSV 8. HIGH RISK DISEASE: "Do you have any chronic medical problems?" (e.g., heart or lung disease, asthma, weak immune system, or other HIGH RISK conditions)     Patient is a paraplegic  10. OTHER SYMPTOMS: "Do you have any other symptoms?"  (e.g., runny nose, muscle aches, headache, sore throat)     Chills, headache, cough  Protocols used: Influenza (Flu) - [facility]

## 2023-04-21 NOTE — Progress Notes (Signed)
Virtual Visit Consent   [name] Daily, you are scheduled for a virtual visit with a [facility] provider today. Just as with appointments in the office, your consent must be obtained to participate. Your consent will be active for this visit and any virtual visit you may have with one of our providers in the next 365 days. If you have a MyChart account, a copy of this consent can be sent to you electronically.  As this is a virtual visit, video technology does not allow for your provider to perform a traditional examination. This may limit your provider's ability to fully assess your condition. If your provider identifies any concerns that need to be evaluated in person or the need to arrange testing (such as labs, EKG, etc.), we will make arrangements to do so. Although advances in technology are sophisticated, we cannot ensure that it will always work on either your end or our end. If the connection with a video visit is poor, the visit may have to be switched to a telephone visit. With either a video or telephone visit, we are not always able to ensure that we have a secure connection.  By engaging in this virtual visit, you consent to the provision of healthcare and authorize for your insurance to be billed (if applicable) for the services provided during this visit. Depending on your insurance coverage, you may receive a charge related to this service.  I need to obtain your verbal consent now. Are you willing to proceed with your visit today? [name] has provided verbal consent on 04/21/2023 for a virtual visit (video or telephone). Jannifer Rodney, [name]  Date: 04/21/2023 9:51 AM   Virtual Visit via Video Note   I, Jannifer Rodney, connected with  [name]  ([id number], 1980/05/01) on 04/21/23 at  9:40 AM EST by a video-enabled telemedicine application and verified that I am speaking with the correct person using two identifiers.  Location: Patient: Virtual Visit Location  Patient: Home Provider: Virtual Visit Location Provider: Home Office   I discussed the limitations of evaluation and management by telemedicine and the availability of in person appointments. The patient expressed understanding and agreed to proceed.    History of Present Illness: [name] is a 43 y.o. who identifies as a female who was assigned female at birth, and is being seen today for flu like symptoms that three days ago. Reports her mother had flu.   HPI: Influenza This is a new problem. The current episode started in the past 7 days. The problem occurs intermittently. The problem has been gradually worsening. Associated symptoms include chills, congestion, coughing, fatigue, a fever (102), headaches, myalgias, nausea and a sore throat. Pertinent negatives include no vomiting. She has tried rest for the symptoms. The treatment provided moderate relief.    Problems:  Patient Active Problem List   Diagnosis Date Noted   Thrombocytosis 03/13/2022   Hypoalbuminemia due to protein-calorie malnutrition (HCC) 03/13/2022   Seizures (HCC) 03/13/2022   Presence of intrathecal baclofen pump 01/13/2022   Major depressive disorder, recurrent episode, in full remission (HCC) 01/13/2022   Irritant contact dermatitis due to incontinence of both feces and urine 09/03/2021   Complicated open wound of right thigh    Fibrosis of right knee joint    S/P knee surgery 04/02/2021   Essential hypertension 04/02/2020   Erosion of urethra due to catheterization of urinary tract (HCC) 01/19/2020   Obesity, Class III, BMI 40-49.9 (morbid obesity) (HCC)  Bilateral ovarian cysts 12/06/2019   Muscle spasticity 12/06/2019   Chronic cystitis with hematuria 10/28/2019   Generalized anxiety disorder 07/04/2019   Controlled substance agreement signed 07/04/2019   Suprapubic catheter (HCC) 07/04/2019   Mixed hyperlipidemia 07/04/2019   Neurogenic bladder 07/04/2019   Stage IV pressure ulcer of sacral  region [facility])    GERD (gastroesophageal reflux disease)    Chronic pain syndrome 05/04/2018   Anemia 06/10/2016   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 "Lung issues"   Lisinopril Cough   Nickel Rash    Rash and green coloring when wearing jewelry containing nickel.   Medications:  Current Outpatient Medications:    benzonatate (TESSALON) 200 MG capsule, Take 1 capsule (200 mg total) by mouth 3 (three) times daily as needed., Disp: 30 capsule, Rfl: 1   ondansetron (ZOFRAN) 4 MG tablet, Take 1 tablet (4 mg total) by mouth every 8 (eight) hours as needed for nausea or vomiting., Disp: 20 tablet, Rfl: 0   oseltamivir (TAMIFLU) 75 MG capsule, Take 1 capsule (75 mg total) by mouth 2 (two) times daily., Disp: 10 capsule, Rfl: 0   acetaminophen (TYLENOL) 500 MG tablet, Take 1,000 mg by mouth daily as needed for moderate pain or headache., Disp: , Rfl:    amLODipine (NORVASC) 2.5 MG tablet, Take 1 tablet (2.5 mg total) by mouth daily. For blood pressure, Disp: 90 tablet, Rfl: 3   azelastine (ASTELIN) 0.1 % nasal spray, Place 1 spray into both nostrils 2 (two) times daily., Disp: 30 mL, Rfl: 12   busPIRone (BUSPAR) 15 MG tablet, TAKE 1 TABLET BY MOUTH 2 TIMES DAILY., Disp: 180 tablet, Rfl: 0   Cholecalciferol (VITAMIN D3) 50 MCG (2000 UT) TABS, TAKE 1 TABLET BY MOUTH EVERY DAY, Disp: 90 tablet, Rfl: 1   citalopram (CELEXA) 40 MG tablet, TAKE 1 TABLET BY MOUTH EVERY DAY, Disp: 90 tablet, Rfl: 0   [employer] VITAMIN B12 1000 MCG tablet, TAKE 1  TABLET BY MOUTH EVERY DAY, Disp: 90 tablet, Rfl: 2   cyclobenzaprine (FLEXERIL) 5 MG tablet, TAKE 1 TABLET BY MOUTH THREE TIMES A DAY, Disp: 270 tablet, Rfl: 3   diclofenac Sodium (VOLTAREN) 1 % GEL, APPLY 2 GRAMS TO AFFECTED AREA 4 TIMES A DAY (Patient taking differently: Apply 2 g topically 4 (four) times daily as needed (pain).), Disp: 300 g, Rfl: 0   docusate sodium (COLACE) 100 MG capsule, TAKE 1 CAPSULE (100 MG TOTAL) BY MOUTH EVERY 12 (TWELVE) HOURS AS NEEDED FOR MILD CONSTIPATION., Disp: 180 capsule, Rfl: 0   doxycycline (VIBRA-TABS) 100 MG tablet, Take 1 tablet (100 mg total) by mouth daily., Disp: 90 tablet, Rfl: 3   feeding supplement (ENSURE ENLIVE / ENSURE PLUS) LIQD, Take 237 mLs by mouth 2 (two) times daily between meals., Disp: 237 mL, Rfl: 12   fluticasone (FLONASE) 50 MCG/ACT nasal spray, Place 2 sprays into both nostrils daily., Disp: 48 mL, Rfl: 2   gabapentin (NEURONTIN) 600 MG tablet, Take 1 tablet (600 mg total) by mouth in the morning, at noon, in the evening, and  at bedtime., Disp: 360 tablet, Rfl: 1   hydrOXYzine (ATARAX) 25 MG tablet, TAKE 1 TABLET BY MOUTH 3 TIMES DAILY AS NEEDED FOR ANXIETY., Disp: 270 tablet, Rfl: 0   Ipratropium-Albuterol (COMBIVENT RESPIMAT) 20-100 MCG/ACT AERS respimat, Inhale 1 puff into the lungs every 6 (six) hours as needed., Disp: 4 g, Rfl: 5   lacosamide (VIMPAT) 200 MG TABS tablet, Take 1 tablet (200 mg total) by mouth 2 (two) times daily., Disp: 60 tablet, Rfl: 1   levocetirizine (XYZAL) 5 MG tablet, Take 1 tablet (5 mg total) by mouth at bedtime as needed for allergies., Disp: 90 tablet, Rfl: 3   levothyroxine (SYNTHROID) 88 MCG tablet, Take 1 tablet (88 mcg total) by mouth daily before breakfast., Disp: 90 tablet, Rfl: 3   Lidocaine 4 % PTCH, Place 1 patch onto the skin daily as needed (pain)., Disp: , Rfl:    meclizine (ANTIVERT) 12.5 MG tablet, Take 1 tablet (12.5 mg total) by mouth 3 (three) times daily as needed for dizziness., Disp: 30  tablet, Rfl: 2   nystatin (NYAMYC) powder, APPLY 1 APPLICATION TOPICALLY 3 (THREE) TIMES DAILY., Disp: 60 g, Rfl: 0   omeprazole (PRILOSEC) 40 MG capsule, Take 1 capsule (40 mg total) by mouth 2 (two) times daily., Disp: 60 capsule, Rfl: 3   Oxycodone HCl 10 MG TABS, Take 1 tablet (10 mg total) by mouth in the morning, at noon, and at bedtime. 90 tabs for 30 days, Disp: 90 tablet, Rfl: 0   [START ON 05/04/2023] Oxycodone HCl 10 MG TABS, Take 1 tablet in the morning, at noon and at bedtime. 90 tabs for 30 days, Disp: 90 tablet, Rfl: 0   [START ON 06/03/2023] Oxycodone HCl 10 MG TABS, Take 1 tablet in the morning, at noon and at bedtime. 90 tabs for 30 days, Disp: 90 tablet, Rfl: 0   PAIN MANAGEMENT INTRATHECAL, IT, PUMP, 1 each by Intrathecal route Continuous EPIDURAL. Intrathecal (IT) medication:  Baclofen, Disp: , Rfl:   Observations/Objective: Patient is well-developed, well-nourished in no acute distress.  Resting comfortably  at home.  Head is normocephalic, atraumatic.  No labored breathing.  Speech is clear and coherent with logical content.  Patient is alert and oriented at baseline.    Assessment and Plan: 1. Exposure to the flu (Primary) - oseltamivir (TAMIFLU) 75 MG capsule; Take 1 capsule (75 mg total) by mouth 2 (two) times daily.  Dispense: 10 capsule; Refill: 0 - benzonatate (TESSALON) 200 MG capsule; Take 1 capsule (200 mg total) by mouth 3 (three) times daily as needed.  Dispense: 30 capsule; Refill: 1 - ondansetron (ZOFRAN) 4 MG tablet; Take 1 tablet (4 mg total) by mouth every 8 (eight) hours as needed for nausea or vomiting.  Dispense: 20 tablet; Refill: 0  2. Flu-like symptoms - oseltamivir (TAMIFLU) 75 MG capsule; Take 1 capsule (75 mg total) by mouth 2 (two) times daily.  Dispense: 10 capsule; Refill: 0 - benzonatate (TESSALON) 200 MG capsule; Take 1 capsule (200 mg total) by mouth 3 (three) times daily as needed.  Dispense: 30 capsule; Refill: 1 - ondansetron (ZOFRAN) 4  MG tablet; Take 1 tablet (4 mg total) by mouth every 8 (eight) hours as needed for nausea or vomiting.  Dispense: 20 tablet; Refill: 0  3. 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  Follow up if symptoms worsen or  do not improve   Follow Up Instructions: I discussed the assessment and treatment plan with the patient. The patient was provided an opportunity to ask questions and all were answered. The patient agreed with the plan and demonstrated an understanding of the instructions.  A copy of instructions were sent to the patient via MyChart unless otherwise noted below.     The patient was advised to call back or seek an in-person evaluation if the symptoms worsen or if the condition fails to improve as anticipated.    Jannifer Rodney, [name]

## 2023-05-08 ENCOUNTER — Other Ambulatory Visit: Payer: Self-pay | Admitting: Family Medicine

## 2023-05-08 DIAGNOSIS — F411 Generalized anxiety disorder: Secondary | ICD-10-CM

## 2023-05-08 DIAGNOSIS — K219 Gastro-esophageal reflux disease without esophagitis: Secondary | ICD-10-CM

## 2023-05-13 ENCOUNTER — Encounter (HOSPITAL_BASED_OUTPATIENT_CLINIC_OR_DEPARTMENT_OTHER): Payer: Medicaid Other | Attending: General Surgery | Admitting: Internal Medicine

## 2023-05-13 DIAGNOSIS — L89154 Pressure ulcer of sacral region, stage 4: Secondary | ICD-10-CM | POA: Insufficient documentation

## 2023-05-13 DIAGNOSIS — L97522 Non-pressure chronic ulcer of other part of left foot with fat layer exposed: Secondary | ICD-10-CM | POA: Insufficient documentation

## 2023-05-13 DIAGNOSIS — G8220 Paraplegia, unspecified: Secondary | ICD-10-CM | POA: Diagnosis not present

## 2023-05-25 ENCOUNTER — Other Ambulatory Visit: Payer: Self-pay | Admitting: Family Medicine

## 2023-05-25 DIAGNOSIS — F411 Generalized anxiety disorder: Secondary | ICD-10-CM

## 2023-05-25 DIAGNOSIS — F3342 Major depressive disorder, recurrent, in full remission: Secondary | ICD-10-CM

## 2023-06-09 ENCOUNTER — Encounter (HOSPITAL_BASED_OUTPATIENT_CLINIC_OR_DEPARTMENT_OTHER): Attending: General Surgery | Admitting: General Surgery

## 2023-06-09 DIAGNOSIS — L97522 Non-pressure chronic ulcer of other part of left foot with fat layer exposed: Secondary | ICD-10-CM | POA: Insufficient documentation

## 2023-06-09 DIAGNOSIS — G8222 Paraplegia, incomplete: Secondary | ICD-10-CM | POA: Insufficient documentation

## 2023-06-14 ENCOUNTER — Other Ambulatory Visit: Payer: Self-pay | Admitting: Family Medicine

## 2023-06-24 ENCOUNTER — Encounter: Payer: Self-pay | Admitting: Family Medicine

## 2023-06-24 ENCOUNTER — Ambulatory Visit: Payer: Medicaid Other | Admitting: Family Medicine

## 2023-06-24 ENCOUNTER — Ambulatory Visit (INDEPENDENT_AMBULATORY_CARE_PROVIDER_SITE_OTHER): Payer: Medicaid Other | Admitting: Family Medicine

## 2023-06-24 VITALS — BP 131/90 | HR 96 | Temp 98.6°F | Ht 66.0 in

## 2023-06-24 DIAGNOSIS — S34101D Unspecified injury to L1 level of lumbar spinal cord, subsequent encounter: Secondary | ICD-10-CM

## 2023-06-24 DIAGNOSIS — G8220 Paraplegia, unspecified: Secondary | ICD-10-CM

## 2023-06-24 DIAGNOSIS — F411 Generalized anxiety disorder: Secondary | ICD-10-CM

## 2023-06-24 DIAGNOSIS — Z79899 Other long term (current) drug therapy: Secondary | ICD-10-CM | POA: Diagnosis not present

## 2023-06-24 DIAGNOSIS — G8929 Other chronic pain: Secondary | ICD-10-CM | POA: Diagnosis not present

## 2023-06-24 DIAGNOSIS — F3342 Major depressive disorder, recurrent, in full remission: Secondary | ICD-10-CM

## 2023-06-24 DIAGNOSIS — S34101S Unspecified injury to L1 level of lumbar spinal cord, sequela: Secondary | ICD-10-CM

## 2023-06-24 DIAGNOSIS — R569 Unspecified convulsions: Secondary | ICD-10-CM

## 2023-06-24 DIAGNOSIS — E039 Hypothyroidism, unspecified: Secondary | ICD-10-CM

## 2023-06-24 MED ORDER — CITALOPRAM HYDROBROMIDE 40 MG PO TABS: 40.0000 mg | ORAL_TABLET | Freq: Every day | ORAL | 4 refills | Status: AC

## 2023-06-24 MED ORDER — OXYCODONE HCL 10 MG PO TABS
ORAL_TABLET | ORAL | 0 refills | Status: DC
Start: 2023-08-02 — End: 2023-09-23

## 2023-06-24 MED ORDER — OXYCODONE HCL 10 MG PO TABS: 10.0000 mg | ORAL_TABLET | Freq: Three times a day (TID) | ORAL | 0 refills | Status: DC

## 2023-06-24 MED ORDER — KETOROLAC TROMETHAMINE 30 MG/ML IJ SOLN
30.0000 mg | Freq: Once | INTRAMUSCULAR | Status: AC
  Administered 2023-06-24: 30 mg via INTRAMUSCULAR

## 2023-06-24 MED ORDER — OXYCODONE HCL 10 MG PO TABS: ORAL_TABLET | ORAL | 0 refills | Status: DC
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
## 2023-06-25 LAB — CMP14+EGFR
ALT: 18 IU/L (ref 0–32)
AST: 19 IU/L (ref 0–40)
Albumin: 4.1 g/dL (ref 3.9–4.9)
Alkaline Phosphatase: 112 IU/L (ref 44–121)
BUN/Creatinine Ratio: 18 (ref 9–23)
BUN: 14 mg/dL (ref 6–24)
Bilirubin Total: 0.2 mg/dL (ref 0.0–1.2)
CO2: 21 mmol/L (ref 20–29)
Calcium: 9 mg/dL (ref 8.7–10.2)
Chloride: 99 mmol/L (ref 96–106)
Creatinine, Ser: 0.79 mg/dL (ref 0.57–1.00)
Globulin, Total: 2.7 g/dL (ref 1.5–4.5)
Glucose: 107 mg/dL — ABNORMAL HIGH (ref 70–99)
Potassium: 4.6 mmol/L (ref 3.5–5.2)
Sodium: 136 mmol/L (ref 134–144)
Total Protein: 6.8 g/dL (ref 6.0–8.5)
eGFR: 96 mL/min/{1.73_m2} (ref >59)

## 2023-06-25 LAB — CBC
Hematocrit: 41.1 % (ref 34.0–46.6)
Hemoglobin: 13 g/dL (ref 11.1–15.9)
MCH: 27.8 pg (ref 26.6–33.0)
MCHC: 31.6 g/dL (ref 31.5–35.7)
MCV: 88 fL (ref 79–97)
Platelets: 472 10*3/uL — ABNORMAL HIGH (ref 150–450)
RBC: 4.68 x10E6/uL (ref 3.77–5.28)
RDW: 13.6 % (ref 11.7–15.4)
WBC: 11.6 10*3/uL — ABNORMAL HIGH (ref 3.4–10.8)

## 2023-06-25 LAB — TSH+FREE T4
Free T4: 1.12 ng/dL (ref 0.82–1.77)
TSH: 3.49 u[IU]/mL (ref 0.450–4.500)

## 2023-06-26 ENCOUNTER — Encounter: Payer: Self-pay | Admitting: Family Medicine

## 2023-07-07 ENCOUNTER — Encounter (HOSPITAL_BASED_OUTPATIENT_CLINIC_OR_DEPARTMENT_OTHER): Attending: General Surgery | Admitting: General Surgery

## 2023-07-07 DIAGNOSIS — L89620 Pressure ulcer of left heel, unstageable: Secondary | ICD-10-CM | POA: Diagnosis not present

## 2023-07-07 DIAGNOSIS — L97512 Non-pressure chronic ulcer of other part of right foot with fat layer exposed: Secondary | ICD-10-CM | POA: Insufficient documentation

## 2023-07-07 DIAGNOSIS — G8222 Paraplegia, incomplete: Secondary | ICD-10-CM | POA: Diagnosis not present

## 2023-07-07 DIAGNOSIS — L97811 Non-pressure chronic ulcer of other part of right lower leg limited to breakdown of skin: Secondary | ICD-10-CM | POA: Insufficient documentation

## 2023-07-07 DIAGNOSIS — L89624 Pressure ulcer of left heel, stage 4: Secondary | ICD-10-CM | POA: Insufficient documentation

## 2023-07-07 DIAGNOSIS — L97218 Non-pressure chronic ulcer of right calf with other specified severity: Secondary | ICD-10-CM | POA: Insufficient documentation

## 2023-07-07 DIAGNOSIS — L89154 Pressure ulcer of sacral region, stage 4: Secondary | ICD-10-CM | POA: Insufficient documentation

## 2023-07-12 ENCOUNTER — Other Ambulatory Visit: Payer: Self-pay | Admitting: Family Medicine

## 2023-07-12 DIAGNOSIS — G8929 Other chronic pain: Secondary | ICD-10-CM

## 2023-07-12 DIAGNOSIS — Z8639 Personal history of other endocrine, nutritional and metabolic disease: Secondary | ICD-10-CM

## 2023-07-25 ENCOUNTER — Other Ambulatory Visit: Payer: Self-pay | Admitting: Family Medicine

## 2023-07-28 NOTE — Telephone Encounter (Signed)
 Last OV 06/24/2023. Last RF 2023 by B Lenon Radar. Next OV 09/23/2023

## 2023-08-03 ENCOUNTER — Telehealth: Payer: Self-pay

## 2023-08-03 NOTE — Telephone Encounter (Signed)
 Copied from CRM [id number]. Topic: Clinical - Medication Prior Auth >> Aug 03, 2023  2:47 PM Fredrica W wrote: Reason for CRM: Patient mother called states PA for Oxycodone  HCl 10 MG TABS expired would need a new one.

## 2023-08-04 ENCOUNTER — Other Ambulatory Visit (HOSPITAL_COMMUNITY): Payer: Self-pay

## 2023-08-04 ENCOUNTER — Telehealth: Payer: Self-pay

## 2023-08-04 NOTE — Telephone Encounter (Signed)
 Pharmacy Patient Advocate Encounter   Received notification from Pt Calls Messages that prior authorization for Oxycodone  10 mg tablets is required/requested.   Insurance verification completed.   The patient is insured through [facility] MEDICAID .   Per test claim: PA required; PA submitted to above mentioned insurance via Fax Key/confirmation #/EOC [id number] Status is pending  *[employer] website was giving an internal error message that the agent on the phone did not know how to fix so I filled out the form and faxed it along with chart documentation to [phone]

## 2023-08-04 NOTE — Telephone Encounter (Signed)
 Copied from CRM [id number]. Topic: Clinical - Medication Prior Auth >> Aug 03, 2023  2:47 PM Fredrica W wrote: Reason for CRM: Patient mother called states PA for Oxycodone  HCl 10 MG TABS expired would need a new one. >> Aug 04, 2023  2:21 PM Felizardo Hotter wrote: Pt called stated she forgot to provide Reference# [id number] for prior authorization.

## 2023-08-05 ENCOUNTER — Encounter (HOSPITAL_BASED_OUTPATIENT_CLINIC_OR_DEPARTMENT_OTHER): Attending: General Surgery | Admitting: General Surgery

## 2023-08-05 ENCOUNTER — Other Ambulatory Visit: Payer: Self-pay | Admitting: Gastroenterology

## 2023-08-05 ENCOUNTER — Telehealth: Payer: Self-pay | Admitting: *Deleted

## 2023-08-05 DIAGNOSIS — G8222 Paraplegia, incomplete: Secondary | ICD-10-CM | POA: Insufficient documentation

## 2023-08-05 DIAGNOSIS — L97512 Non-pressure chronic ulcer of other part of right foot with fat layer exposed: Secondary | ICD-10-CM | POA: Insufficient documentation

## 2023-08-05 DIAGNOSIS — L89154 Pressure ulcer of sacral region, stage 4: Secondary | ICD-10-CM | POA: Diagnosis present

## 2023-08-05 NOTE — Telephone Encounter (Signed)
 NA/VM not set up PA in process waiting on response    Copied from CRM [id number]. Topic: Clinical - Medication Question >> Aug 05, 2023  8:27 AM Lotus Round B wrote: Reason for CRM: pt called in stating that something is wrong with her insurance . She is trying to get a prescription for the Oxycodone  HCl 10 MG TABS medication and would like to see if Dr.Gottschalk can fix it to where she can get her medication . Please call patient if this is possible to do . [phone]

## 2023-08-07 ENCOUNTER — Other Ambulatory Visit (HOSPITAL_COMMUNITY): Payer: Self-pay

## 2023-08-07 ENCOUNTER — Telehealth: Payer: Self-pay

## 2023-08-07 ENCOUNTER — Telehealth: Payer: Self-pay | Admitting: Family Medicine

## 2023-08-07 NOTE — Telephone Encounter (Signed)
 Attempted to contact patient and voicemail is not set up

## 2023-08-07 NOTE — Telephone Encounter (Signed)
Noted  -LS

## 2023-08-07 NOTE — Telephone Encounter (Signed)
 Called [name] to get an update but they claimed they never received the fax. I have tried to do it online but it keeps giving me the same internal error message. I have re faxed everything over again stating that this was the 2nd request and it is an urgent request. I flagged her profile in remind me and will follow up on Monday 08/10/23.

## 2023-08-07 NOTE — Telephone Encounter (Signed)
 Copied from CRM [id number]. Topic: Clinical - Medication Question >> Aug 07, 2023  8:16 AM Jenice Mitts wrote: Reason for CRM: Patient is wondering if her Oxycodone  can be sent in and she will pay for it

## 2023-08-07 NOTE — Telephone Encounter (Signed)
 Is there any update on patients PA? Patient has been out of medication since last Sunday and is requesting to pay cash but per pharmacy PA has to be denied before they can go ahead with a cash pay. Please advise

## 2023-08-07 NOTE — Telephone Encounter (Signed)
 Copied from CRM [id number]. Topic: General - Other >> Aug 07, 2023 11:00 AM Yolanda T wrote: Reason for CRM: patient returning CAL's call. >> Aug 07, 2023  2:37 PM Emylou G wrote: Pls call patient or mom.. still waiting to hear back.. on status to pay cash

## 2023-08-07 NOTE — Telephone Encounter (Signed)
 Left detailed message making patient aware of this.

## 2023-08-07 NOTE — Telephone Encounter (Signed)
 Copied from CRM [id number]. Topic: General - Other >> Aug 07, 2023  3:06 PM Santiya F wrote: Reason for CRM: Patient is calling in checking on the status of the medication being sent in. She is requesting someone from the office give her a call as soon as possible. She says the office needs to tell the pharmacy that it's okay to fill it and she pay cash.

## 2023-08-10 ENCOUNTER — Other Ambulatory Visit (HOSPITAL_COMMUNITY): Payer: Self-pay

## 2023-08-10 NOTE — Telephone Encounter (Signed)
 Pharmacy Patient Advocate Encounter  Received notification from [facility] MEDICAID that Prior Authorization for Oxycodone  10 mg tablets  has been APPROVED from 08/10/23 to 09/16/23. Ran test claim, Copay is $0. This test claim was processed through [facility]- copay amounts may vary at other pharmacies due to pharmacy/plan contracts, or as the patient moves through the different stages of their insurance plan.   PA #/Case ID/Reference #: [id number]   *[facility]

## 2023-08-10 NOTE — Telephone Encounter (Signed)
 Pt informed via Mychart message. LS

## 2023-08-10 NOTE — Telephone Encounter (Signed)
 I have no idea what this means. Does she need PA? New Rx?  Please call pharmacy and inquire what the issue is.

## 2023-08-12 ENCOUNTER — Other Ambulatory Visit: Payer: Self-pay | Admitting: Family Medicine

## 2023-08-12 DIAGNOSIS — F411 Generalized anxiety disorder: Secondary | ICD-10-CM

## 2023-09-02 ENCOUNTER — Encounter (HOSPITAL_BASED_OUTPATIENT_CLINIC_OR_DEPARTMENT_OTHER): Attending: General Surgery | Admitting: General Surgery

## 2023-09-02 DIAGNOSIS — G8222 Paraplegia, incomplete: Secondary | ICD-10-CM | POA: Diagnosis not present

## 2023-09-02 DIAGNOSIS — L89154 Pressure ulcer of sacral region, stage 4: Secondary | ICD-10-CM | POA: Insufficient documentation

## 2023-09-08 ENCOUNTER — Telehealth: Payer: Self-pay | Admitting: Family Medicine

## 2023-09-08 NOTE — Telephone Encounter (Signed)
 Patient has post dated rx's for Oxy for May, June and July.  Please see her EMR.  You may need to confirm with her pharmacy receipt but they all show received in [facility]

## 2023-09-08 NOTE — Telephone Encounter (Unsigned)
 Copied from CRM [id number]. Topic: Clinical - Medication Refill >> Sep 08, 2023  9:39 AM Emylou G wrote: Medication: Oxycodone  HCl 10 MG TABS  Has the patient contacted their pharmacy? Yes (Agent: If no, request that the patient contact the pharmacy for the refill. If patient does not wish to contact the pharmacy document the reason why and proceed with request.) (Agent: If yes, when and what did the pharmacy advise?) said to call us   This is the patient's preferred pharmacy:  [id number] [place] [place] - [address] [place] [address] Phone: [phone] Fax: [phone]  Is this the correct pharmacy for this prescription? Yes If no, delete pharmacy and type the correct one.   Has the prescription been filled recently? Yes  Is the patient out of the medication? Yes  Has the patient been seen for an appointment in the last year OR does the patient have an upcoming appointment? Yes  Can we respond through MyChart? No  Agent: Please be advised that Rx refills may take up to 3 business days. We ask that you follow-up with your pharmacy.

## 2023-09-09 ENCOUNTER — Telehealth: Payer: Self-pay

## 2023-09-09 NOTE — Telephone Encounter (Signed)
 Contacted pharmacy.  Oxycodone  is requiring prior authorization through [employer].  Patient is asking if we could expedite PA.

## 2023-09-09 NOTE — Telephone Encounter (Signed)
>>   Sep 09, 2023 10:06 AM Montie POUR wrote: Graycee is calling back and said to change the date of [facility] to [facility]. Medicaid said to call [place] track # [phone]) when date is changed and Medicaid will approve. Please message through MyChart when this is completed    Copied from CRM [id number]. Topic: Clinical - Medication Refill >> Sep 08, 2023  9:39 AM Emylou G wrote: Medication: Oxycodone  HCl 10 MG TABS  Has the patient contacted their pharmacy? Yes (Agent: If no, request that the patient contact the pharmacy for the refill. If patient does not wish to contact the pharmacy document the reason why and proceed with request.) (Agent: If yes, when and what did the pharmacy advise?) said to call us   This is the patient's preferred pharmacy:  [id number] [place], [place] - [address] [place] [address] Phone: [phone] Fax: [phone]  Is this the correct pharmacy for this prescription? Yes If no, delete pharmacy and type the correct one.   Has the prescription been filled recently? Yes  Is the patient out of the medication? Yes  Has the patient been seen for an appointment in the last year OR does the patient have an upcoming appointment? Yes  Can we respond through MyChart? No  Agent: Please be advised that Rx refills may take up to 3 business days. We ask that you follow-up with your pharmacy.

## 2023-09-10 ENCOUNTER — Telehealth: Payer: Self-pay | Admitting: Pharmacy Technician

## 2023-09-10 ENCOUNTER — Other Ambulatory Visit (HOSPITAL_COMMUNITY): Payer: Self-pay

## 2023-09-10 NOTE — Telephone Encounter (Signed)
 Pharmacy Patient Advocate Encounter   Received notification from Pt Calls Messages that prior authorization for Oxycodone  10mg  is required/requested.   Insurance verification completed.   The patient is insured through [facility] MEDICAID .   Per test claim: PA required; PA submitted to above mentioned insurance via [facility] Key/confirmation #/EOC [id number] W Status is pending

## 2023-09-14 ENCOUNTER — Other Ambulatory Visit (HOSPITAL_COMMUNITY): Payer: Self-pay

## 2023-09-16 ENCOUNTER — Other Ambulatory Visit: Payer: Self-pay | Admitting: Family Medicine

## 2023-09-16 DIAGNOSIS — L700 Acne vulgaris: Secondary | ICD-10-CM

## 2023-09-17 NOTE — Telephone Encounter (Signed)
 PA request has been Submitted. New Encounter has been or will be created for follow up. For additional info see Pharmacy Prior Auth telephone encounter from 07/10.

## 2023-09-18 NOTE — Telephone Encounter (Signed)
 Awaiting determination. Noted. LS

## 2023-09-23 ENCOUNTER — Encounter: Payer: Self-pay | Admitting: Family Medicine

## 2023-09-23 ENCOUNTER — Ambulatory Visit: Admitting: Family Medicine

## 2023-09-23 VITALS — BP 119/76 | HR 88 | Temp 98.7°F | Ht 66.0 in

## 2023-09-23 DIAGNOSIS — K592 Neurogenic bowel, not elsewhere classified: Secondary | ICD-10-CM

## 2023-09-23 DIAGNOSIS — G8220 Paraplegia, unspecified: Secondary | ICD-10-CM

## 2023-09-23 DIAGNOSIS — T1490XS Injury, unspecified, sequela: Secondary | ICD-10-CM | POA: Diagnosis not present

## 2023-09-23 DIAGNOSIS — G8929 Other chronic pain: Secondary | ICD-10-CM | POA: Diagnosis not present

## 2023-09-23 DIAGNOSIS — S34101D Unspecified injury to L1 level of lumbar spinal cord, subsequent encounter: Secondary | ICD-10-CM | POA: Diagnosis not present

## 2023-09-23 DIAGNOSIS — N644 Mastodynia: Secondary | ICD-10-CM

## 2023-09-23 DIAGNOSIS — L700 Acne vulgaris: Secondary | ICD-10-CM

## 2023-09-23 DIAGNOSIS — N319 Neuromuscular dysfunction of bladder, unspecified: Secondary | ICD-10-CM

## 2023-09-23 DIAGNOSIS — E039 Hypothyroidism, unspecified: Secondary | ICD-10-CM

## 2023-09-23 MED ORDER — KETOROLAC TROMETHAMINE 30 MG/ML IJ SOLN
30.0000 mg | Freq: Once | INTRAMUSCULAR | Status: AC
Start: 2023-09-23 — End: 2023-09-23
  Administered 2023-09-23: 30 mg via INTRAMUSCULAR

## 2023-09-23 MED ORDER — DOXYCYCLINE HYCLATE 100 MG PO TABS: 100.0000 mg | ORAL_TABLET | Freq: Every day | ORAL | 3 refills | Status: DC

## 2023-09-23 MED ORDER — LEVOTHYROXINE SODIUM 88 MCG PO TABS: 88.0000 ug | ORAL_TABLET | Freq: Every day | ORAL | 3 refills | Status: AC

## 2023-09-23 MED ORDER — OXYCODONE HCL 10 MG PO TABS: ORAL_TABLET | ORAL | 0 refills | Status: DC

## 2023-09-23 MED ORDER — GABAPENTIN 600 MG PO TABS: 600.0000 mg | ORAL_TABLET | Freq: Four times a day (QID) | ORAL | 3 refills | Status: AC

## 2023-09-23 MED ORDER — CLINDAMYCIN PHOSPHATE 1 % EX SWAB
CUTANEOUS | 3 refills | Status: AC
Start: 2023-09-23 — End: ?

## 2023-09-23 MED ORDER — OXYCODONE HCL 10 MG PO TABS: 10.0000 mg | ORAL_TABLET | Freq: Three times a day (TID) | ORAL | 0 refills | Status: DC

## 2023-09-23 MED ORDER — BUPROPION HCL ER (SR) 150 MG PO TB12: 150.0000 mg | ORAL_TABLET | Freq: Two times a day (BID) | ORAL | 3 refills | Status: AC

## 2023-09-23 MED ORDER — CYCLOBENZAPRINE HCL 5 MG PO TABS: 5.0000 mg | ORAL_TABLET | Freq: Three times a day (TID) | ORAL | 3 refills | Status: AC
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
## 2023-09-30 ENCOUNTER — Encounter (HOSPITAL_BASED_OUTPATIENT_CLINIC_OR_DEPARTMENT_OTHER): Admitting: General Surgery

## 2023-09-30 DIAGNOSIS — L89154 Pressure ulcer of sacral region, stage 4: Secondary | ICD-10-CM | POA: Diagnosis not present

## 2023-10-05 NOTE — Telephone Encounter (Signed)
 Pharmacy Patient Advocate Encounter  Received notification from [place] MEDICAID that Prior Authorization for Oxycodone  has been APPROVED from 09/10/23 to 03/08/24   PA #/Case ID/Reference #: [id number]

## 2023-10-12 ENCOUNTER — Other Ambulatory Visit: Payer: Self-pay | Admitting: Family Medicine

## 2023-10-13 ENCOUNTER — Ambulatory Visit (HOSPITAL_COMMUNITY)
Admission: RE | Admit: 2023-10-13 | Discharge: 2023-10-13 | Disposition: A | Discharge status: Home / Self Care | Source: Ambulatory Visit | Attending: Family Medicine | Admitting: Family Medicine

## 2023-10-13 ENCOUNTER — Ambulatory Visit (HOSPITAL_COMMUNITY)
Admission: RE | Admit: 2023-10-13 | Discharge: 2023-10-13 | Disposition: A | Discharge status: Home / Self Care | Source: Ambulatory Visit | Attending: Family Medicine

## 2023-10-13 ENCOUNTER — Encounter (HOSPITAL_COMMUNITY): Payer: Self-pay

## 2023-10-13 DIAGNOSIS — N644 Mastodynia: Secondary | ICD-10-CM

## 2023-10-27 ENCOUNTER — Encounter: Payer: Self-pay | Admitting: Physician Assistant

## 2023-10-27 ENCOUNTER — Ambulatory Visit (INDEPENDENT_AMBULATORY_CARE_PROVIDER_SITE_OTHER): Admitting: Physician Assistant

## 2023-10-27 VITALS — BP 136/95

## 2023-10-27 DIAGNOSIS — Z87898 Personal history of other specified conditions: Secondary | ICD-10-CM | POA: Diagnosis not present

## 2023-10-27 DIAGNOSIS — R21 Rash and other nonspecific skin eruption: Secondary | ICD-10-CM

## 2023-10-27 DIAGNOSIS — Z09 Encounter for follow-up examination after completed treatment for conditions other than malignant neoplasm: Secondary | ICD-10-CM | POA: Diagnosis not present

## 2023-10-27 NOTE — Patient Instructions (Signed)
 Important Information  Due to recent changes in healthcare laws, you may see results of your pathology and/or laboratory studies on MyChart before the doctors have had a chance to review them. We understand that in some cases there may be results that are confusing or concerning to you. Please understand that not all results are received at the same time and often the doctors may need to interpret multiple results in order to provide you with the best plan of care or course of treatment. Therefore, we ask that you please give [place] 2 business days to thoroughly review all your results before contacting the office for clarification. Should we see a critical lab result, you will be contacted sooner.   If You Need Anything After Your Visit  If you have any questions or concerns for your doctor, please call our main line at [phone] If no one answers, please leave a voicemail as directed and we will return your call as soon as possible. Messages left after 4 pm will be answered the following business day.   You may also send [place] a message via MyChart. We typically respond to MyChart messages within 1-2 business days.  For prescription refills, please ask your pharmacy to contact our office. Our fax number is [phone].  If you have an urgent issue when the clinic is closed that cannot wait until the next business day, you can page your doctor at the number below.    Please note that while we do our best to be available for urgent issues outside of office hours, we are not available 24/7.   If you have an urgent issue and are unable to reach [place], you may choose to seek medical care at your doctor's office, retail clinic, urgent care center, or emergency room.  If you have a medical emergency, please immediately call 911 or go to the emergency department. In the event of inclement weather, please call our main line at [phone] for an update on the status of any delays or  closures.  Dermatology Medication Tips: Please keep the boxes that topical medications come in in order to help keep track of the instructions about where and how to use these. Pharmacies typically print the medication instructions only on the boxes and not directly on the medication tubes.   If your medication is too expensive, please contact our office at [phone] or send [place] a message through MyChart.   We are unable to tell what your co-pay for medications will be in advance as this is different depending on your insurance coverage. However, we may be able to find a substitute medication at lower cost or fill out paperwork to get insurance to cover a needed medication.   If a prior authorization is required to get your medication covered by your insurance company, please allow [place] 1-2 business days to complete this process.  Drug prices often vary depending on where the prescription is filled and some pharmacies may offer cheaper prices.  The website www.goodrx.com contains coupons for medications through different pharmacies. The prices here do not account for what the cost may be with help from insurance (it may be cheaper with your insurance), but the website can give you the price if you did not use any insurance.  - You can print the associated coupon and take it with your prescription to the pharmacy.  - You may also stop by our office during regular business hours and pick up a GoodRx coupon card.  - If  you need your prescription sent electronically to a different pharmacy, notify our office through [facility] or by phone at [phone]

## 2023-10-27 NOTE — Progress Notes (Signed)
   New Patient Visit   Subjective  [name] is a 43 y.o. female NEW PATIENT who presents for the following: Pt has a few spots on her legs, toes and face Patient's mother states they have a black center. Patients mom think they could be from bugs but they keep coming back. Pt denies any itching or burning. Referral for this rash was back in Sept of 2024. Rash is no longer present. .   Accompanied by Mother and Aid  The following portions of the chart were reviewed this encounter and updated as appropriate: medications, allergies, medical history  Review of Systems:  No other skin or systemic complaints except as noted in HPI or Assessment and Plan.  Objective  Well appearing patient in no apparent distress; mood and affect are within normal limits.  A full examination was performed including scalp, head, eyes, ears, nose, lips, neck, chest, axillae, abdomen, back, buttocks, bilateral upper extremities, bilateral lower extremities, hands, feet, fingers, toes, fingernails, and toenails. All findings within normal limits unless otherwise noted below.   A focused examination was performed of the following areas: Face and legs and feet  Relevant exam findings are noted in the Assessment and Plan.    Assessment & Plan   RASH - NOW RESOLVED  - Call if recurs       RASH AND OTHER NONSPECIFIC SKIN ERUPTION    Return if symptoms worsen or fail to improve.  I, Doyce Pan, CMA, am acting as scribe for Johni Narine K, PA-C.   Documentation: I have reviewed the above documentation for accuracy and completeness, and I agree with the above.  Joe Tanney K, PA-C

## 2023-10-28 ENCOUNTER — Encounter (HOSPITAL_BASED_OUTPATIENT_CLINIC_OR_DEPARTMENT_OTHER): Attending: General Surgery | Admitting: General Surgery

## 2023-10-28 DIAGNOSIS — L89154 Pressure ulcer of sacral region, stage 4: Secondary | ICD-10-CM | POA: Diagnosis not present

## 2023-10-28 DIAGNOSIS — L89124 Pressure ulcer of left upper back, stage 4: Secondary | ICD-10-CM | POA: Diagnosis present

## 2023-10-28 DIAGNOSIS — G8222 Paraplegia, incomplete: Secondary | ICD-10-CM | POA: Insufficient documentation

## 2023-11-09 ENCOUNTER — Other Ambulatory Visit: Payer: Self-pay | Admitting: Family Medicine

## 2023-11-09 DIAGNOSIS — F411 Generalized anxiety disorder: Secondary | ICD-10-CM

## 2023-11-25 ENCOUNTER — Encounter (HOSPITAL_BASED_OUTPATIENT_CLINIC_OR_DEPARTMENT_OTHER): Attending: General Surgery | Admitting: General Surgery

## 2023-11-25 DIAGNOSIS — G8222 Paraplegia, incomplete: Secondary | ICD-10-CM | POA: Diagnosis not present

## 2023-11-25 DIAGNOSIS — L89154 Pressure ulcer of sacral region, stage 4: Secondary | ICD-10-CM | POA: Diagnosis present

## 2023-11-26 ENCOUNTER — Ambulatory Visit: Payer: Self-pay

## 2023-11-26 NOTE — Telephone Encounter (Signed)
 Mother reports that it takes 5 days to coordinate transportation for this patient d/t paraplegia. Requesting increase/change in BP medication. Will continue to monitor and has upcoming visit 10/27. Prefers phone if needing to contact.  [id number] - [place], [place] - [address] [phone]   FYI Only or Action Required?: Action required by provider: clinical question for provider.  Patient was last seen in primary care on 09/23/2023 by [name], DO.  Called Nurse Triage reporting Hypertension.  Symptoms began several weeks ago.  Interventions attempted: Rest, hydration, or home remedies.  Symptoms are: unchanged.  Triage Disposition: See PCP When Office is Open (Within 3 Days)  Patient/caregiver understands and will follow disposition?: No, wishes to speak with PCP Reason for Disposition  Systolic BP >= 160 OR Diastolic >= 100  Answer Assessment - Initial Assessment Questions Patient's mother was transferred to this RN for triage. Denies higher acuity questions. 911 to ED precautions reviewed, verbalized understanding.   1. BLOOD PRESSURE: What is your blood pressure? Did you take at least two measurements 5 minutes apart?     160-150/100-102  2. ONSET:      Elevated x 2-2.5 weeks  3. HOW: How did you take your blood pressure? (e.g., automatic home BP monitor, visiting nurse)     Machine  4. HISTORY: Do you have a history of high blood pressure?     Yes  5. MEDICINES: Are you taking any medicines for blood pressure? Have you missed any doses recently?     Amlodipine   6. OTHER SYMPTOMS: Do you have any symptoms? (e.g., blurred vision, chest pain, difficulty breathing, headache, weakness)     Had an episode of dizziness last weekend when she had cold symptoms, resolved  Protocols used: Blood Pressure - High-A-AH Copied from CRM [id number]. Topic: Clinical - Red Word Triage >> Nov 26, 2023  9:54 AM Harlene ORN wrote: Red Word that prompted  transfer to Nurse Triage: Patient is taking Amlodipine  and is still running a high BP. Her latest reading was 153 over 102 (11/25/2023). Patient is having headaches and on and off dizziness.

## 2023-11-26 NOTE — Telephone Encounter (Signed)
 Please review and advise.

## 2023-11-27 NOTE — Telephone Encounter (Signed)
 I spoke with pt's mother and advised of provider feedback and scheduled appt with nurse 12/07/23 at 10:30 as they need 5 business days to arrange transportation.

## 2023-11-27 NOTE — Telephone Encounter (Signed)
 I'd really like her to have a nurse BP first.  If she is having a pain response, an increased dose of BP med could cause life threatening LOW blood pressure so I want to make sure these are true elevations and not transient ones due to her pain.

## 2023-12-07 ENCOUNTER — Other Ambulatory Visit: Payer: Self-pay | Admitting: Family Medicine

## 2023-12-07 ENCOUNTER — Ambulatory Visit (INDEPENDENT_AMBULATORY_CARE_PROVIDER_SITE_OTHER)

## 2023-12-07 DIAGNOSIS — I10 Essential (primary) hypertension: Secondary | ICD-10-CM

## 2023-12-07 MED ORDER — AMLODIPINE BESYLATE 5 MG PO TABS: 5.0000 mg | ORAL_TABLET | Freq: Every day | ORAL | 3 refills | Status: AC

## 2023-12-07 NOTE — Progress Notes (Signed)
 Patient presented to office for a blood pressure check. States her blood pressures have been running hight at home. Here in office the first reading was 133/91 with a pulse of 84 and we obtained a second reading of 139/94 with a pulse of 85. Advised patient we would contact her with any changes that you felt were necessary

## 2023-12-14 ENCOUNTER — Encounter: Payer: Self-pay | Admitting: Nurse Practitioner

## 2023-12-14 ENCOUNTER — Ambulatory Visit: Payer: Self-pay

## 2023-12-14 ENCOUNTER — Telehealth (INDEPENDENT_AMBULATORY_CARE_PROVIDER_SITE_OTHER): Admitting: Nurse Practitioner

## 2023-12-14 DIAGNOSIS — N3001 Acute cystitis with hematuria: Secondary | ICD-10-CM | POA: Diagnosis not present

## 2023-12-14 DIAGNOSIS — R8290 Unspecified abnormal findings in urine: Secondary | ICD-10-CM

## 2023-12-14 LAB — URINALYSIS, ROUTINE W REFLEX MICROSCOPIC
Bilirubin, UA: NEGATIVE
Glucose, UA: NEGATIVE
Ketones, UA: NEGATIVE
Nitrite, UA: POSITIVE — AB
Specific Gravity, UA: 1.015 (ref 1.005–1.030)
Urobilinogen, Ur: 0.2 mg/dL (ref 0.2–1.0)
pH, UA: 7.5 (ref 5.0–7.5)

## 2023-12-14 LAB — MICROSCOPIC EXAMINATION
Epithelial Cells (non renal): NONE SEEN /HPF (ref 0–10)
Renal Epithel, UA: NONE SEEN /HPF
Yeast, UA: NONE SEEN

## 2023-12-14 MED ORDER — CEPHALEXIN 500 MG PO CAPS: 500.0000 mg | ORAL_CAPSULE | Freq: Two times a day (BID) | ORAL | 0 refills | Status: DC

## 2023-12-14 NOTE — Telephone Encounter (Signed)
 FYI Only or Action Required?: Action required by provider: update on patient condition.  Patient was last seen in primary care on 09/23/2023 by [name], DO.  Called Nurse Triage reporting Dysuria.  Symptoms began a week ago.  Interventions attempted: Rest, hydration, or home remedies.  Symptoms are: unchanged.  Triage Disposition: See Physician Within 24 Hours  Patient/caregiver understands and will follow disposition?: UnsureCopied from CRM #[id number]. Topic: Clinical - Red Word Triage >> Dec 14, 2023  8:29 AM [name] wrote: Red Word that prompted transfer to Nurse Triage: Patient's mother, [name], calling to ask if she can come to clinic and get a cup for a clean catch for patient. States patient is paraplegic &  has a stoma and her urine has a very bad smell to it. States it smells almost like what she had previously, E coli and wants to check. Called clinic and spoke with [name] and she stated that she would need an appt for that because she can't just drop it off or anything. Patient currently has an appt for October 27th. Reason for Disposition  Bad or foul-smelling urine  Answer Assessment - Initial Assessment Questions Pt is paraplegic and unable to get into office due to transportation logistics. They have to give company 5 days notice. Mom afraid pt has  E coli.  Mom has E Coli in urine and currently being treated with antimitotics.   Can [name] let me come get a cup and when I change  her stoma this morning I can do a clean catch for her test  and not wait until 10/27 appt? Please advise no appt made at this time. Pt told mom  I'm not going to ED. Call my doctor.     1. SYMPTOM: What's the main symptom you're concerned about? (e.g., frequency, incontinence)     odor 2. ONSET: When did the    start?     10 days ago  3. PAIN: Is there any pain? If Yes, ask: How bad is it? (Scale: 1-10; mild, moderate, severe)     [name] is cramping 4. CAUSE: What do  you think is causing the symptoms?     UTI 5. OTHER SYMPTOMS: Do you have any other symptoms? (e.g., blood in urine, fever, flank pain, pain with urination)     Lower back pain; possible fever; fatigue  Protocols used: Urinary Symptoms-A-AH

## 2023-12-14 NOTE — Telephone Encounter (Signed)
 Lmtcb to schedule an apt

## 2023-12-14 NOTE — Telephone Encounter (Signed)
 PLEASE SCHEDULE VIRTUAL WITH DOD AND LET MOTHER KNOW SHE CAN PICK UP URINE CUP
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
## 2023-12-17 ENCOUNTER — Ambulatory Visit: Payer: Self-pay | Admitting: Nurse Practitioner

## 2023-12-17 LAB — URINE CULTURE

## 2023-12-21 ENCOUNTER — Ambulatory Visit: Payer: Self-pay

## 2023-12-21 ENCOUNTER — Other Ambulatory Visit: Payer: Self-pay | Admitting: Nurse Practitioner

## 2023-12-21 ENCOUNTER — Ambulatory Visit: Admitting: Family Medicine

## 2023-12-21 DIAGNOSIS — N3001 Acute cystitis with hematuria: Secondary | ICD-10-CM

## 2023-12-21 MED ORDER — SULFAMETHOXAZOLE-TRIMETHOPRIM 800-160 MG PO TABS: 1.0000 | ORAL_TABLET | Freq: Two times a day (BID) | ORAL | 0 refills | Status: DC

## 2023-12-21 NOTE — Progress Notes (Signed)
 [name] was on  Keflex  500 mg twice daily, mom reports she finished a course of antibiotic and still not feeling good .since she had a double bacteria in her urine plan to start Bactrim  1 tablet twice a day for 7 days.

## 2023-12-21 NOTE — Telephone Encounter (Signed)
 FYI Only or Action Required?: Action required by provider: PCP has no appts available: Mom calling to get RX for pt for UTI that has not cleared up - please call Mom to update on plan of care.  Patient was last seen in primary care on 12/14/2023 by Jamie Morton Sebastian Nena, NP.  Called Nurse Triage reporting Dysuria.  Symptoms began last Monday and worsening.  Interventions attempted: Prescription medications: ABX.  Symptoms are: rapidly worsening.  Triage Disposition: See Physician Within 24 Hours  Patient/caregiver understands and will follow disposition?: No, wishes to speak with PCP       Copied from CRM #[id number]. Topic: Clinical - Red Word Triage >> Dec 21, 2023  8:19 AM Montie POUR wrote: Red Word that prompted transfer to Nurse Triage:  She has taken all of her cephALEXin  (KEFLEX ) 500 MG capsule. Saturday her catheter bag turned blue and had chunky things in it. Today she is very tired and her stomach hurts and diarrhea.Things are getting worse. It takes 5 business days to set up transportation. Mom can bring urine sample in today if needed. She has an office visit on 12/28/23.   Was better until Thursday or Friday Blue-ecoli  chunchy Reason for Disposition  Unusual vaginal discharge (e.g., bad smelling, yellow, green, or foamy-white)    Mom would like information sent to PCP - possible new Rx sent in for pt.  Please call Mom and update if rx will be sent or if appt is needed.  Answer Assessment - Initial Assessment Questions 1. SEVERITY: How bad is the pain?  (e.g., Scale 1-10; mild, moderate, or severe)     pressure 2. FREQUENCY: How many times have you had painful urination today?     na 3. PATTERN: Is pain present every time you urinate or just sometimes?      constant 4. ONSET: When did the painful urination start?      Constant since last Monday 5. FEVER: Do you have a fever? If Yes, ask: What is your temperature, how was it measured, and when did it  start?     101.4 6. PAST UTI: Have you had a urine infection before? If Yes, ask: When was the last time? and What happened that time?      yes 7. CAUSE: What do you think is causing the painful urination?  (e.g., UTI, scratch, Herpes sore)     UTI and possible e.coli 8. OTHER SYMPTOMS: Do you have any other symptoms? (e.g., blood in urine, flank pain, genital sores, urgency, vaginal discharge)    low Back pain-moderate, pressure in vagina, stomach pain near stoma area, tired, fever 101.4 9. PREGNANCY: Is there any chance you are pregnant? When was your last menstrual period?     na   Per Mom -Blue coloring in bag - which usually means e.coli. also still having chunky debris bag which usually means infection. Mom stated pt was feeling better for a few days but now she is tired and the pressure has started back.    Pt can not take macroid because causes pt to stop breathing - scarred lungs  Protocols used: Urination Pain - Female-A-AH

## 2023-12-23 ENCOUNTER — Encounter (HOSPITAL_BASED_OUTPATIENT_CLINIC_OR_DEPARTMENT_OTHER): Attending: General Surgery | Admitting: General Surgery

## 2023-12-23 DIAGNOSIS — G8222 Paraplegia, incomplete: Secondary | ICD-10-CM | POA: Diagnosis not present

## 2023-12-23 DIAGNOSIS — L89154 Pressure ulcer of sacral region, stage 4: Secondary | ICD-10-CM | POA: Diagnosis present

## 2023-12-28 ENCOUNTER — Ambulatory Visit: Admitting: Family Medicine

## 2023-12-28 ENCOUNTER — Encounter: Payer: Self-pay | Admitting: Family Medicine

## 2023-12-28 ENCOUNTER — Telehealth: Payer: Self-pay

## 2023-12-28 VITALS — BP 127/84 | HR 107 | Temp 98.5°F

## 2023-12-28 DIAGNOSIS — S34101S Unspecified injury to L1 level of lumbar spinal cord, sequela: Secondary | ICD-10-CM

## 2023-12-28 DIAGNOSIS — G8929 Other chronic pain: Secondary | ICD-10-CM | POA: Diagnosis not present

## 2023-12-28 DIAGNOSIS — Z23 Encounter for immunization: Secondary | ICD-10-CM | POA: Diagnosis not present

## 2023-12-28 DIAGNOSIS — Z79899 Other long term (current) drug therapy: Secondary | ICD-10-CM

## 2023-12-28 DIAGNOSIS — G8220 Paraplegia, unspecified: Secondary | ICD-10-CM

## 2023-12-28 DIAGNOSIS — T1490XS Injury, unspecified, sequela: Secondary | ICD-10-CM

## 2023-12-28 DIAGNOSIS — N3001 Acute cystitis with hematuria: Secondary | ICD-10-CM

## 2023-12-28 DIAGNOSIS — E039 Hypothyroidism, unspecified: Secondary | ICD-10-CM

## 2023-12-28 DIAGNOSIS — D72829 Elevated white blood cell count, unspecified: Secondary | ICD-10-CM

## 2023-12-28 DIAGNOSIS — S34101D Unspecified injury to L1 level of lumbar spinal cord, subsequent encounter: Secondary | ICD-10-CM

## 2023-12-28 DIAGNOSIS — R739 Hyperglycemia, unspecified: Secondary | ICD-10-CM

## 2023-12-28 LAB — BAYER DCA HB A1C WAIVED: HB A1C (BAYER DCA - WAIVED): 4 % — ABNORMAL LOW (ref 4.8–5.6)

## 2023-12-28 MED ORDER — FLUCONAZOLE 150 MG PO TABS: 150.0000 mg | ORAL_TABLET | Freq: Once | ORAL | 0 refills | Status: AC

## 2023-12-28 MED ORDER — OXYCODONE HCL 10 MG PO TABS: 10.0000 mg | ORAL_TABLET | Freq: Three times a day (TID) | ORAL | 0 refills | Status: DC

## 2023-12-28 MED ORDER — KETOROLAC TROMETHAMINE 30 MG/ML IJ SOLN
30.0000 mg | Freq: Once | INTRAMUSCULAR | Status: AC
  Administered 2023-12-28: 30 mg via INTRAMUSCULAR

## 2023-12-28 MED ORDER — OXYCODONE HCL 10 MG PO TABS: ORAL_TABLET | ORAL | 0 refills | Status: DC

## 2023-12-28 MED ORDER — SULFAMETHOXAZOLE-TRIMETHOPRIM 800-160 MG PO TABS
1.0000 | ORAL_TABLET | Freq: Two times a day (BID) | ORAL | 0 refills | Status: AC
Start: 2023-12-28 — End: 2024-01-04

## 2023-12-28 NOTE — Telephone Encounter (Signed)
 Copied from CRM [id number]. Topic: Clinical - Prescription Issue >> Dec 28, 2023  2:51 PM Tobias CROME wrote: Reason for CRM: Mom states patient has future refills for the oxycodone  set for 01/08/24; 02/07/24; and 03/08/2024. Mom states 02/07/2024 falls on a Sunday and whenever patient is needing refills for this medication and the future fill is set to happen over the weekend, the pharmacy has trouble filling due to insurance. Pharmacy cannot contact insurance over the weekend and then patient goes without medication.   Mom inquiring what can be done to avoid having issue with the pharmacy. Mom states the issue is not with office it is with the pharmacy.
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
## 2023-12-29 ENCOUNTER — Ambulatory Visit: Payer: Self-pay | Admitting: Family Medicine

## 2023-12-29 MED ORDER — OXYCODONE HCL 10 MG PO TABS: ORAL_TABLET | ORAL | 0 refills | Status: DC

## 2023-12-29 NOTE — Telephone Encounter (Signed)
 Please apologize for me. I missed that one when I was ordering them/  Dec rx fixed for 12/5

## 2023-12-29 NOTE — Telephone Encounter (Signed)
 Pt informed    LS

## 2023-12-29 NOTE — Addendum Note (Signed)
 Addended by: [name] on: 12/29/2023 10:40 AM   Modules accepted: Orders

## 2023-12-30 ENCOUNTER — Telehealth: Payer: Self-pay

## 2023-12-30 NOTE — Telephone Encounter (Signed)
 Copied from CRM #[id number]. Topic: Clinical - Prescription Issue >> Dec 30, 2023  3:09 PM Rosaria BRAVO wrote: Reason for CRM: Pt's mother called reporting a prescription issue with the pharmacy, says sulfamethoxazole -trimethoprim  (BACTRIM  DS) was not called in for 800-160 MG tablet, its a weaker strength.   [id number] - [place] [place] - [address] [employer] [address] Phone: [phone] Fax: [phone]  Best contact: [phone]

## 2024-01-04 ENCOUNTER — Other Ambulatory Visit: Payer: Self-pay | Admitting: *Deleted

## 2024-01-04 DIAGNOSIS — J302 Other seasonal allergic rhinitis: Secondary | ICD-10-CM

## 2024-01-04 DIAGNOSIS — K219 Gastro-esophageal reflux disease without esophagitis: Secondary | ICD-10-CM

## 2024-01-08 LAB — CBC WITH DIFFERENTIAL/PLATELET
Basophils Absolute: 0.1 x10E3/uL (ref 0.0–0.2)
Basos: 0 %
EOS (ABSOLUTE): 0.2 x10E3/uL (ref 0.0–0.4)
Eos: 2 %
Hematocrit: 43.6 % (ref 34.0–46.6)
Hemoglobin: 13.9 g/dL (ref 11.1–15.9)
Immature Grans (Abs): 0 x10E3/uL (ref 0.0–0.1)
Immature Granulocytes: 0 %
Lymphocytes Absolute: 2.4 x10E3/uL (ref 0.7–3.1)
Lymphs: 19 %
MCH: 28.1 pg (ref 26.6–33.0)
MCHC: 31.9 g/dL (ref 31.5–35.7)
MCV: 88 fL (ref 79–97)
Monocytes Absolute: 0.8 x10E3/uL (ref 0.1–0.9)
Monocytes: 6 %
Neutrophils Absolute: 9.3 x10E3/uL — ABNORMAL HIGH (ref 1.4–7.0)
Neutrophils: 73 %
Platelets: 467 x10E3/uL — ABNORMAL HIGH (ref 150–450)
RBC: 4.95 x10E6/uL (ref 3.77–5.28)
RDW: 13.3 % (ref 11.7–15.4)
WBC: 12.7 x10E3/uL — ABNORMAL HIGH (ref 3.4–10.8)

## 2024-01-08 LAB — OXYCODONES,MS,WB/SP RFX
Oxycocone: 8.2 ng/mL
Oxycodones Confirmation: POSITIVE
Oxymorphone: NEGATIVE ng/mL

## 2024-01-08 LAB — CMP14+EGFR
ALT: 29 IU/L (ref 0–32)
AST: 18 IU/L (ref 0–40)
Albumin: 4.1 g/dL (ref 3.9–4.9)
Alkaline Phosphatase: 106 IU/L (ref 41–116)
BUN/Creatinine Ratio: 31 — ABNORMAL HIGH (ref 9–23)
BUN: 20 mg/dL (ref 6–24)
Bilirubin Total: 0.3 mg/dL (ref 0.0–1.2)
CO2: 23 mmol/L (ref 20–29)
Calcium: 9.3 mg/dL (ref 8.7–10.2)
Chloride: 99 mmol/L (ref 96–106)
Creatinine, Ser: 0.65 mg/dL (ref 0.57–1.00)
Globulin, Total: 2.5 g/dL (ref 1.5–4.5)
Glucose: 140 mg/dL — ABNORMAL HIGH (ref 70–99)
Potassium: 5 mmol/L (ref 3.5–5.2)
Sodium: 136 mmol/L (ref 134–144)
Total Protein: 6.6 g/dL (ref 6.0–8.5)
eGFR: 113 mL/min/1.73 (ref >59)

## 2024-01-08 LAB — DRUG SCREEN 10 W/CONF, SERUM
Amphetamines, IA: NEGATIVE ng/mL
Barbiturates, IA: NEGATIVE ug/mL
Benzodiazepines, IA: NEGATIVE ng/mL
Cocaine & Metabolite, IA: NEGATIVE ng/mL
Methadone, IA: NEGATIVE ng/mL
Opiates, IA: NEGATIVE ng/mL
Oxycodones, IA: POSITIVE ng/mL — AB
Phencyclidine, IA: NEGATIVE ng/mL
Propoxyphene, IA: NEGATIVE ng/mL
THC(Marijuana) Metabolite, IA: NEGATIVE ng/mL

## 2024-01-08 LAB — TSH+FREE T4
Free T4: 1.06 ng/dL (ref 0.82–1.77)
TSH: 2.92 u[IU]/mL (ref 0.450–4.500)

## 2024-01-20 ENCOUNTER — Encounter (HOSPITAL_BASED_OUTPATIENT_CLINIC_OR_DEPARTMENT_OTHER): Attending: General Surgery | Admitting: General Surgery

## 2024-01-20 DIAGNOSIS — L89154 Pressure ulcer of sacral region, stage 4: Secondary | ICD-10-CM | POA: Insufficient documentation

## 2024-01-20 DIAGNOSIS — G8222 Paraplegia, incomplete: Secondary | ICD-10-CM | POA: Insufficient documentation

## 2024-02-02 ENCOUNTER — Telehealth: Admitting: Family

## 2024-02-02 ENCOUNTER — Encounter: Payer: Self-pay | Admitting: Family

## 2024-02-02 ENCOUNTER — Ambulatory Visit: Payer: Self-pay | Admitting: Family

## 2024-02-02 DIAGNOSIS — N3001 Acute cystitis with hematuria: Secondary | ICD-10-CM

## 2024-02-02 DIAGNOSIS — R1030 Lower abdominal pain, unspecified: Secondary | ICD-10-CM

## 2024-02-02 LAB — MICROSCOPIC EXAMINATION
RBC, Urine: NONE SEEN /HPF (ref 0–2)
Renal Epithel, UA: NONE SEEN /HPF
Yeast, UA: NONE SEEN

## 2024-02-02 LAB — URINALYSIS, COMPLETE
Bilirubin, UA: NEGATIVE
Glucose, UA: NEGATIVE
Ketones, UA: NEGATIVE
Nitrite, UA: POSITIVE — AB
Protein,UA: NEGATIVE
Specific Gravity, UA: 1.015 (ref 1.005–1.030)
Urobilinogen, Ur: 0.2 mg/dL (ref 0.2–1.0)
pH, UA: 7 (ref 5.0–7.5)

## 2024-02-02 MED ORDER — CIPROFLOXACIN HCL 500 MG PO TABS: 500.0000 mg | ORAL_TABLET | Freq: Two times a day (BID) | ORAL | 0 refills | Status: DC

## 2024-02-02 MED ORDER — ONDANSETRON HCL 4 MG PO TABS: 4.0000 mg | ORAL_TABLET | Freq: Three times a day (TID) | ORAL | 0 refills | Status: AC | PRN

## 2024-02-02 NOTE — Progress Notes (Signed)
 Virtual Visit Consent   [name], you are scheduled for a virtual visit with a [facility] provider today. Just as with appointments in the office, your consent must be obtained to participate. Your consent will be active for this visit and any virtual visit you may have with one of our providers in the next 365 days. If you have a MyChart account, a copy of this consent can be sent to you electronically.  As this is a virtual visit, video technology does not allow for your provider to perform a traditional examination. This may limit your provider's ability to fully assess your condition. If your provider identifies any concerns that need to be evaluated in person or the need to arrange testing (such as labs, EKG, etc.), we will make arrangements to do so. Although advances in technology are sophisticated, we cannot ensure that it will always work on either your end or our end. If the connection with a video visit is poor, the visit may have to be switched to a telephone visit. With either a video or telephone visit, we are not always able to ensure that we have a secure connection.  By engaging in this virtual visit, you consent to the provision of healthcare and authorize for your insurance to be billed (if applicable) for the services provided during this visit. Depending on your insurance coverage, you may receive a charge related to this service.  I need to obtain your verbal consent now. Are you willing to proceed with your visit today? [name] has provided verbal consent on 02/02/2024 for a virtual visit (video or telephone). Bari Learn, [name]  Date: 02/02/2024 1:22 PM   Virtual Visit via Video Note   I, Bari Learn, connected with  [name]  ([id number], May 05, 1980) on 02/02/24 at  5:30 PM EST by a video-enabled telemedicine application and verified that I am speaking with the correct person using two identifiers.  Location: Patient: Virtual Visit Location  Patient: Home Provider: Virtual Visit Location Provider: Home Office   I discussed the limitations of evaluation and management by telemedicine and the availability of in person appointments. The patient expressed understanding and agreed to proceed.    History of Present Illness: [name] is a 43 y.o. who identifies as a female who was assigned female at birth, and is being seen today for stoma changes, lower abdomen pain, fatigue, and nausea . She has hx of chronic UTI's.   She had a UTI in 12/14/23 and had a positive urine culture that grew klebsiella pneumoniae and proteus mirabilis. She  given cipro  and then bactrim .   She is followed by Urologists.  HPI: HPI  Problems:  Patient Active Problem List   Diagnosis Date Noted   Thrombocytosis 03/13/2022   Hypoalbuminemia due to protein-calorie malnutrition 03/13/2022   Seizures (HCC) 03/13/2022   Presence of intrathecal baclofen  pump 01/13/2022   Major depressive disorder, recurrent episode, in full remission 01/13/2022   Irritant contact dermatitis due to incontinence of both feces and urine 09/03/2021   Complicated open wound of right thigh    Fibrosis of right knee joint    S/P knee surgery 04/02/2021   Essential hypertension 04/02/2020   Erosion of urethra due to catheterization of urinary tract 01/19/2020   Obesity, Class III, BMI 40-49.9 (morbid obesity) (HCC)    Bilateral ovarian cysts 12/06/2019   Muscle spasticity 12/06/2019   Chronic cystitis with hematuria 10/28/2019   Generalized anxiety disorder 07/04/2019   Controlled  substance agreement signed 07/04/2019   Suprapubic catheter (HCC) 07/04/2019   Mixed hyperlipidemia 07/04/2019   Neurogenic bladder 07/04/2019   Stage IV pressure ulcer of sacral region [facility])    GERD (gastroesophageal reflux disease)    Chronic pain syndrome 05/04/2018   Anemia 06/10/2016   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 Lung issues   Lisinopril  Cough   Nickel Rash    Rash and green coloring when wearing jewelry containing nickel.   Medications:  Current Outpatient Medications:    ciprofloxacin  (CIPRO ) 500 MG tablet, Take 1 tablet (500 mg total) by mouth 2 (two) times daily., Disp: 20 tablet, Rfl: 0   acetaminophen  (TYLENOL ) 500 MG tablet, Take 1,000 mg by mouth daily as needed for moderate pain or headache., Disp: , Rfl:    amLODipine  (NORVASC ) 5 MG tablet, Take 1 tablet (5 mg total) by mouth daily. For blood pressure *dose change, Disp: 90 tablet, Rfl: 3   azelastine  (ASTELIN ) 0.1 % nasal spray, Place 1 spray into both nostrils 2 (two) times daily., Disp: 30 mL, Rfl: 12   buPROPion  (WELLBUTRIN  SR) 150 MG 12 hr tablet, Take 1 tablet (150 mg total) by mouth 2 (two) times daily., Disp: 180 tablet, Rfl: 3   busPIRone  (BUSPAR ) 15 MG tablet, TAKE 1 TABLET BY MOUTH TWICE A DAY, Disp: 180 tablet, Rfl: 0   Cholecalciferol  (VITAMIN D3) 50 MCG (2000 UT) capsule, TAKE 1 CAPSULE BY MOUTH EVERY DAY, Disp: 90 capsule, Rfl: 1   citalopram  (CELEXA ) 40 MG tablet, Take 1 tablet (40 mg total) by mouth daily., Disp: 90 tablet, Rfl: 4   clindamycin  (CLEOCIN  T) 1 % SWAB, Apply to affected areas twice daily for acne, Disp: 180 each, Rfl: 3   [employer] VITAMIN B12 1000 MCG tablet, TAKE 1 TABLET BY MOUTH EVERY DAY, Disp: 90 tablet, Rfl: 2   cyclobenzaprine  (FLEXERIL ) 5 MG tablet, Take 1 tablet (5 mg total) by mouth 3 (three) times daily., Disp: 270 tablet, Rfl: 3   diclofenac  Sodium (VOLTAREN ) 1 % GEL, APPLY 2 GRAMS TO AFFECTED AREA 4 TIMES A  DAY (Patient taking differently: Apply 2 g topically 4 (four) times daily as needed (pain).), Disp: 300 g, Rfl: 0   docusate sodium  (COLACE) 100 MG capsule, TAKE 1 CAPSULE (100 MG TOTAL) BY MOUTH EVERY 12 (TWELVE) HOURS AS NEEDED FOR MILD CONSTIPATION., Disp: 180 capsule, Rfl: 0   feeding supplement (ENSURE ENLIVE / ENSURE PLUS) LIQD, Take 237 mLs by mouth 2 (two) times daily between meals., Disp: 237 mL, Rfl: 12   fluticasone  (FLONASE ) 50 MCG/ACT nasal spray, Place 2 sprays into both nostrils daily., Disp: 48 mL, Rfl: 2   gabapentin  (NEURONTIN ) 600 MG tablet, Take 1 tablet (600 mg total) by mouth in the morning, at noon, in the evening, and at bedtime., Disp: 360 tablet, Rfl: 3   Ipratropium-Albuterol  (COMBIVENT  RESPIMAT) 20-100 MCG/ACT AERS respimat, INHALE 1 PUFF INTO THE LUNGS EVERY 6 HOURS AS NEEDED., Disp: 4 g, Rfl: 5   Lacosamide  (VIMPAT ) 100 MG TABS, Take 100 mg by mouth in the morning and  at bedtime. Per neurologist, Disp: , Rfl:    levocetirizine (XYZAL ) 5 MG tablet, TAKE 1 TABLET (5 MG TOTAL) BY MOUTH AT BEDTIME AS NEEDED FOR ALLERGIES., Disp: 90 tablet, Rfl: 3   levothyroxine  (SYNTHROID ) 88 MCG tablet, Take 1 tablet (88 mcg total) by mouth daily before breakfast., Disp: 90 tablet, Rfl: 3   Lidocaine  4 % PTCH, Place 1 patch onto the skin daily as needed (pain)., Disp: , Rfl:    meclizine  (ANTIVERT ) 12.5 MG tablet, Take 1 tablet (12.5 mg total) by mouth 3 (three) times daily as needed for dizziness., Disp: 30 tablet, Rfl: 2   nystatin  (NYAMYC ) powder, APPLY 1 APPLICATION TOPICALLY 3 (THREE) TIMES DAILY., Disp: 60 g, Rfl: 0   omeprazole  (PRILOSEC) 40 MG capsule, TAKE 1 CAPSULE BY MOUTH TWICE A DAY, Disp: 180 capsule, Rfl: 1   ondansetron  (ZOFRAN ) 4 MG tablet, Take 1 tablet (4 mg total) by mouth every 8 (eight) hours as needed for nausea or vomiting., Disp: 20 tablet, Rfl: 0   Oxycodone  HCl 10 MG TABS, Take 1 tablet (10 mg total) by mouth in the morning, at noon, and at bedtime. 90 tabs for 30  days, Disp: 90 tablet, Rfl: 0   [START ON 03/08/2024] Oxycodone  HCl 10 MG TABS, Take 1 tablet in the morning, at noon and at bedtime. 90 tabs for 30 days, Disp: 90 tablet, Rfl: 0   [START ON 02/05/2024] Oxycodone  HCl 10 MG TABS, Take 1 tablet in the morning, at noon and at bedtime. 90 tabs for 30 days, Disp: 90 tablet, Rfl: 0   PAIN MANAGEMENT INTRATHECAL, IT, PUMP, 1 each by Intrathecal route Continuous EPIDURAL. Intrathecal (IT) medication:  Baclofen , Disp: , Rfl:   Observations/Objective: Patient is well-developed, well-nourished in no acute distress.  Resting comfortably  at home.  Head is normocephalic, atraumatic.  No labored breathing.  Speech is clear and coherent with logical content.  Patient is alert and oriented at baseline.    Assessment and Plan: 1. Lower abdominal pain (Primary) - Urinalysis, Complete - Urine Culture  2. Acute cystitis with hematuria - ciprofloxacin  (CIPRO ) 500 MG tablet; Take 1 tablet (500 mg total) by mouth 2 (two) times daily.  Dispense: 20 tablet; Refill: 0 - ondansetron  (ZOFRAN ) 4 MG tablet; Take 1 tablet (4 mg total) by mouth every 8 (eight) hours as needed for nausea or vomiting.  Dispense: 20 tablet; Refill: 0  Start cipro  today Zofran  as needed for nausea Call Urologists for follow up Follow up if symptoms worsen or do not improve   Follow Up Instructions: I discussed the assessment and treatment plan with the patient. The patient was provided an opportunity to ask questions and all were answered. The patient agreed with the plan and demonstrated an understanding of the instructions.  A copy of instructions were sent to the patient via MyChart unless otherwise noted below.    The patient was advised to call back or seek an in-person evaluation if the symptoms worsen or if the condition fails to improve as anticipated.    Bari Learn, [name]

## 2024-02-04 LAB — URINE CULTURE

## 2024-02-09 ENCOUNTER — Other Ambulatory Visit: Payer: Self-pay | Admitting: Family Medicine

## 2024-02-09 DIAGNOSIS — J302 Other seasonal allergic rhinitis: Secondary | ICD-10-CM

## 2024-02-09 DIAGNOSIS — F411 Generalized anxiety disorder: Secondary | ICD-10-CM

## 2024-02-12 ENCOUNTER — Encounter: Payer: Self-pay | Admitting: Nurse Practitioner

## 2024-02-12 ENCOUNTER — Telehealth: Admitting: Nurse Practitioner

## 2024-02-12 ENCOUNTER — Ambulatory Visit: Payer: Self-pay

## 2024-02-12 ENCOUNTER — Ambulatory Visit: Payer: Self-pay | Admitting: Nurse Practitioner

## 2024-02-12 DIAGNOSIS — A498 Other bacterial infections of unspecified site: Secondary | ICD-10-CM

## 2024-02-12 DIAGNOSIS — R8290 Unspecified abnormal findings in urine: Secondary | ICD-10-CM

## 2024-02-12 LAB — URINALYSIS, ROUTINE W REFLEX MICROSCOPIC
Bilirubin, UA: NEGATIVE
Glucose, UA: NEGATIVE
Ketones, UA: NEGATIVE
Nitrite, UA: NEGATIVE
Specific Gravity, UA: 1.01 (ref 1.005–1.030)
Urobilinogen, Ur: 0.2 mg/dL (ref 0.2–1.0)
pH, UA: 7 (ref 5.0–7.5)

## 2024-02-12 LAB — MICROSCOPIC EXAMINATION
RBC, Urine: NONE SEEN /HPF (ref 0–2)
Renal Epithel, UA: NONE SEEN /HPF
Yeast, UA: NONE SEEN

## 2024-02-12 MED ORDER — CEPHALEXIN 500 MG PO CAPS: 500.0000 mg | ORAL_CAPSULE | Freq: Four times a day (QID) | ORAL | 0 refills | Status: DC

## 2024-02-12 NOTE — Progress Notes (Addendum)
 Virtual Visit Consent   [name], you are scheduled for a virtual visit with Jamie Gladis, [name], a [facility] provider, today.     Just as with appointments in the office, your consent must be obtained to participate.  Your consent will be active for this visit and any virtual visit you may have with one of our providers in the next 365 days.     If you have a MyChart account, a copy of this consent can be sent to you electronically.  All virtual visits are billed to your insurance company just like a traditional visit in the office.    As this is a virtual visit, video technology does not allow for your provider to perform a traditional examination.  This may limit your provider's ability to fully assess your condition.  If your provider identifies any concerns that need to be evaluated in person or the need to arrange testing (such as labs, EKG, etc.), we will make arrangements to do so.     Although advances in technology are sophisticated, we cannot ensure that it will always work on either your end or our end.  If the connection with a video visit is poor, the visit may have to be switched to a telephone visit.  With either a video or telephone visit, we are not always able to ensure that we have a secure connection.     I need to obtain your verbal consent now.   Are you willing to proceed with your visit today? YES   [name] has provided verbal consent on 02/12/2024 for a virtual visit (video or telephone).   Jamie Gladis, [name]   Date: 02/12/2024 1:37 PM   Virtual Visit via Video Note   I, [name], connected with [name] ([id number], Sep 08, 1980) on 02/12/2024 at  2:20 PM EST by a video-enabled telemedicine application and verified that I am speaking with the correct person using two identifiers.  Location: Patient: Virtual Visit Location Patient: Home Provider: Virtual Visit Location Provider: Mobile   I discussed the  limitations of evaluation and management by telemedicine and the availability of in person appointments. The patient expressed understanding and agreed to proceed.    History of Present Illness: [name] is a 43 y.o. who identifies as a female who was assigned female at birth, and is being seen today for several issues.  HPI: Patient has several complaints: 1- uti- patient just came off of cipro  for uti-has a lot of sediment coming out of stoma. Urine is malodorous. 2- rash- has history of citrobacter- looks exactly the same. The rash in her stoma and around wound on buttocks.  Review of Systems  Constitutional:  Positive for malaise/fatigue. Negative for diaphoresis and weight loss.  Eyes:  Negative for blurred vision, double vision and pain.  Respiratory:  Negative for shortness of breath.   Cardiovascular:  Negative for chest pain, palpitations, orthopnea and leg swelling.  Gastrointestinal:  Negative for abdominal pain.  Genitourinary:        Sediment in urine  Skin:  Negative for rash.  Neurological:  Negative for dizziness, sensory change, loss of consciousness, weakness and headaches.  Endo/Heme/Allergies:  Negative for polydipsia. Does not bruise/bleed easily.  Psychiatric/Behavioral:  Negative for memory loss. The patient does not have insomnia.   All other systems reviewed and are negative.   Problems:  Patient Active Problem List   Diagnosis Date Noted   Thrombocytosis 03/13/2022   Hypoalbuminemia due  to protein-calorie malnutrition 03/13/2022   Seizures (HCC) 03/13/2022   Presence of intrathecal baclofen  pump 01/13/2022   Major depressive disorder, recurrent episode, in full remission 01/13/2022   Irritant contact dermatitis due to incontinence of both feces and urine 09/03/2021   Complicated open wound of right thigh    Fibrosis of right knee joint    S/P knee surgery 04/02/2021   Essential hypertension 04/02/2020   Erosion of urethra due to catheterization  of urinary tract 01/19/2020   Obesity, Class III, BMI 40-49.9 (morbid obesity) (HCC)    Bilateral ovarian cysts 12/06/2019   Muscle spasticity 12/06/2019   Chronic cystitis with hematuria 10/28/2019   Generalized anxiety disorder 07/04/2019   Controlled substance agreement signed 07/04/2019   Suprapubic catheter (HCC) 07/04/2019   Mixed hyperlipidemia 07/04/2019   Neurogenic bladder 07/04/2019   Stage IV pressure ulcer of sacral region [facility])    GERD (gastroesophageal reflux disease)    Chronic pain syndrome 05/04/2018   Anemia 06/10/2016   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  Resting comfortably  at home.  Head is normocephalic, atraumatic.  No labored breathing.  Speech is clear and coherent with logical content.  Patient is alert and oriented at baseline.  Stoma erythematous and mist appearing Cannot lesion on buttocks because they re not able to turn patient.  Assessment and Plan:  [name] in today with chief complaint of acute  1. Abnormal urine sediment (Primary) Force fluids - Urinalysis, Routine w reflex microscopic - Urine Culture - cephALEXin  (KEFLEX ) 500 MG capsule; Take 1 capsule (500 mg total) by mouth 4 (four) times daily.  Dispense: 14 capsule; Refill: 0  2. Citrobacter infection Let use know if infection and or rash does not improve with antibiotics.    Follow Up Instructions: I discussed the assessment and treatment plan with the patient.  The patient was provided an opportunity to ask questions and all were answered. The patient agreed with the plan and demonstrated an understanding of the instructions.  A copy of instructions were sent to the patient via MyChart.  The patient was advised to call back or seek an in-person evaluation if the symptoms worsen or if the condition fails to improve as anticipated.  Time:  I spent 8 minutes with the patient via telehealth technology discussing the above problems/concerns.    Jamie Gladis, [name]    [1]  Allergies Allergen Reactions   Macrobid  [Nitrofurantoin ]     Not effective for patient. Lung issues   Lisinopril  Cough   Nickel Rash    Rash and green coloring when wearing jewelry containing nickel.  [2]  Current Outpatient Medications:    acetaminophen  (TYLENOL ) 500 MG tablet, Take 1,000 mg by mouth daily as needed for moderate pain or headache., Disp: , Rfl:    amLODipine  (NORVASC ) 5 MG tablet, Take 1 tablet (5 mg total) by mouth daily. For blood pressure *dose change, Disp: 90 tablet, Rfl: 3   azelastine  (ASTELIN ) 0.1 % nasal spray, Place 1 spray into both nostrils 2 (two) times daily., Disp: 30 mL, Rfl: 12  buPROPion  (WELLBUTRIN  SR) 150 MG 12 hr tablet, Take 1 tablet (150 mg total) by mouth 2 (two) times daily., Disp: 180 tablet, Rfl: 3   busPIRone  (BUSPAR ) 15 MG tablet, TAKE 1 TABLET BY MOUTH TWICE A DAY, Disp: 180 tablet, Rfl: 0   Cholecalciferol  (VITAMIN D3) 50 MCG (2000 UT) capsule, TAKE 1 CAPSULE BY MOUTH EVERY DAY, Disp: 90 capsule, Rfl: 1   ciprofloxacin  (CIPRO ) 500 MG tablet, Take 1 tablet (500 mg total) by mouth 2 (two) times daily., Disp: 20 tablet, Rfl: 0   citalopram  (CELEXA ) 40 MG tablet, Take 1 tablet (40 mg total) by mouth daily., Disp: 90 tablet, Rfl: 4   clindamycin  (CLEOCIN  T) 1 % SWAB, Apply to affected areas twice daily for acne, Disp: 180 each, Rfl: 3   [employer]S VITAMIN B12 1000 MCG tablet, TAKE 1 TABLET BY MOUTH EVERY DAY, Disp: 90 tablet, Rfl: 2    cyclobenzaprine  (FLEXERIL ) 5 MG tablet, Take 1 tablet (5 mg total) by mouth 3 (three) times daily., Disp: 270 tablet, Rfl: 3   diclofenac  Sodium (VOLTAREN ) 1 % GEL, APPLY 2 GRAMS TO AFFECTED AREA 4 TIMES A DAY (Patient taking differently: Apply 2 g topically 4 (four) times daily as needed (pain).), Disp: 300 g, Rfl: 0   docusate sodium  (COLACE) 100 MG capsule, TAKE 1 CAPSULE (100 MG TOTAL) BY MOUTH EVERY 12 (TWELVE) HOURS AS NEEDED FOR MILD CONSTIPATION., Disp: 180 capsule, Rfl: 0   feeding supplement (ENSURE ENLIVE / ENSURE PLUS) LIQD, Take 237 mLs by mouth 2 (two) times daily between meals., Disp: 237 mL, Rfl: 12   fluticasone  (FLONASE ) 50 MCG/ACT nasal spray, SPRAY 2 SPRAYS INTO EACH NOSTRIL EVERY DAY, Disp: 48 mL, Rfl: 2   gabapentin  (NEURONTIN ) 600 MG tablet, Take 1 tablet (600 mg total) by mouth in the morning, at noon, in the evening, and at bedtime., Disp: 360 tablet, Rfl: 3   Ipratropium-Albuterol  (COMBIVENT  RESPIMAT) 20-100 MCG/ACT AERS respimat, INHALE 1 PUFF INTO THE LUNGS EVERY 6 HOURS AS NEEDED., Disp: 4 g, Rfl: 5   Lacosamide  (VIMPAT ) 100 MG TABS, Take 100 mg by mouth in the morning and at bedtime. Per neurologist, Disp: , Rfl:    levocetirizine (XYZAL ) 5 MG tablet, TAKE 1 TABLET (5 MG TOTAL) BY MOUTH AT BEDTIME AS NEEDED FOR ALLERGIES., Disp: 90 tablet, Rfl: 3   levothyroxine  (SYNTHROID ) 88 MCG tablet, Take 1 tablet (88 mcg total) by mouth daily before breakfast., Disp: 90 tablet, Rfl: 3   Lidocaine  4 % PTCH, Place 1 patch onto the skin daily as needed (pain)., Disp: , Rfl:    meclizine  (ANTIVERT ) 12.5 MG tablet, Take 1 tablet (12.5 mg total) by mouth 3 (three) times daily as needed for dizziness., Disp: 30 tablet, Rfl: 2   nystatin  (NYAMYC ) powder, APPLY 1 APPLICATION TOPICALLY 3 (THREE) TIMES DAILY., Disp: 60 g, Rfl: 0   omeprazole  (PRILOSEC) 40 MG capsule, TAKE 1 CAPSULE BY MOUTH TWICE A DAY, Disp: 180 capsule, Rfl: 1   ondansetron  (ZOFRAN ) 4 MG tablet, Take 1 tablet (4 mg total) by  mouth every 8 (eight) hours as needed for nausea or vomiting., Disp: 20 tablet, Rfl: 0   Oxycodone  HCl 10 MG TABS, Take 1 tablet (10 mg total) by mouth in the morning, at noon, and at bedtime. 90 tabs for 30 days, Disp: 90 tablet, Rfl: 0   [START ON 03/08/2024] Oxycodone  HCl 10 MG TABS, Take 1 tablet in the morning, at noon and at bedtime. 90 tabs for 30 days, Disp: 90 tablet,  Rfl: 0   Oxycodone  HCl 10 MG TABS, Take 1 tablet in the morning, at noon and at bedtime. 90 tabs for 30 days, Disp: 90 tablet, Rfl: 0   PAIN MANAGEMENT INTRATHECAL, IT, PUMP, 1 each by Intrathecal route Continuous EPIDURAL. Intrathecal (IT) medication:  Baclofen , Disp: , Rfl:

## 2024-02-12 NOTE — Telephone Encounter (Signed)
°  FYI Only or Action Required?: FYI only for provider: appointment scheduled on 02/12/24.  Patient was last seen in primary care on 02/02/2024 by Lavell Bari LABOR, [name].  Called Nurse Triage reporting Rash and Urinary Tract Infection.  Symptoms began several days ago.  Interventions attempted: OTC medications: tylenol .  Symptoms are: unchanged.  Triage Disposition: See HCP Within 4 Hours (Or PCP Triage)  Patient/caregiver understands and will follow disposition?: Yes  Copied from CRM #[id number]. Topic: Clinical - Red Word Triage >> Feb 12, 2024 11:11 AM Terri MATSU wrote: [facility] that prompted transfer to Nurse Triage: speaking with patient mom. Patient has a rash around her stoma area, hurting in back area. Pressure feeling, headaches, fatigue, chucks coming out in urine and stomach is hurting. Reason for Disposition  Fever > 100.4 F (38.0 C)  Answer Assessment - Initial Assessment Questions Schedule telemedicine appt 02/12/24 at 2:20PM  Pt's mother states will be bring urine specimen to office, prior to apt.  [phone] call this number for pt's mother.  Advised call back or ED/911 if symptoms worsen. [name] verbalized understanding.  [name] reports antibiotic did not help, ongoing symptoms. 1. SYMPTOM: What's the main symptom you're concerned about? (e.g., frequency, incontinence)     Fatigue, fever, weakness 2. ONSET: When did the    start?     Yesterday or day before finished taking Cipro , did not help  Cannot take Macrobid  3. PAIN: Is there any pain? If Yes, ask: How bad is it? (Scale: 1-10; mild, moderate, severe)     9/10 4. CAUSE: What do you think is causing the symptoms?     uti 5. OTHER SYMPTOMS: Do you have any other symptoms? (e.g., blood in urine, fever, flank pain, pain with urination)     Fever 102; last taken AM, took tylenol , rash(red dots, tiny pen marks, around stoma) Have to frequently change stoma bag, chunks of white residue in stoma  bag/urine  Protocols used: Urinary Symptoms-A-AH

## 2024-02-12 NOTE — Telephone Encounter (Signed)
 Noted. Patient is scheduled to be seen today by DOD.

## 2024-02-16 LAB — URINE CULTURE

## 2024-02-16 MED ORDER — FOSFOMYCIN TROMETHAMINE 3 G PO PACK: 3.0000 g | PACK | Freq: Once | ORAL | 0 refills | Status: AC

## 2024-02-17 ENCOUNTER — Telehealth: Payer: Self-pay

## 2024-02-17 MED ORDER — CEPHALEXIN 500 MG PO CAPS: 500.0000 mg | ORAL_CAPSULE | Freq: Two times a day (BID) | ORAL | 0 refills | Status: DC

## 2024-02-17 NOTE — Telephone Encounter (Signed)
 Copied from CRM #[id number]. Topic: Clinical - Red Word Triage >> Feb 12, 2024 11:11 AM Terri MATSU wrote: [facility] that prompted transfer to Nurse Triage: speaking with patient mom. Patient has a rash around her stoma area, hurting in back area. Pressure feeling, headaches, fatigue, chucks coming out in urine and stomach is hurting. >> Feb 17, 2024  9:42 AM Miquel SAILOR wrote: :PT Mother Edna states PT spoke with NT on 12/12 due to UTI infection PT did pick up medication  cephALEXin  (KEFLEX ) 500 MG capsule [[id number]]-[id number]: Take 1 capsule (500 mg total) by mouth 4 (four) times daily. Out of meds now as of 12/17 but still feeling  Tired/Lower back pain/Pressure in Vagina/White stuff coming out of urine still. Reached out to chat NT instructed to send message to office. Pt needs call back [phone]

## 2024-02-17 NOTE — Addendum Note (Signed)
 Addended by: Nataliah Hatlestad, MARY-MARGARET on: 02/17/2024 12:34 PM   Modules accepted: Orders

## 2024-02-17 NOTE — Telephone Encounter (Signed)
 This patient needs to be seen in office.  Virtual visits and visits through telephone is not appropriate given her high risk.  If she is not able to come to appointments, I am glad to refer her out to a homebound program who can come to her home to do appointments

## 2024-02-18 ENCOUNTER — Encounter (HOSPITAL_BASED_OUTPATIENT_CLINIC_OR_DEPARTMENT_OTHER): Attending: General Surgery | Admitting: General Surgery

## 2024-02-18 DIAGNOSIS — L89154 Pressure ulcer of sacral region, stage 4: Secondary | ICD-10-CM | POA: Diagnosis present

## 2024-02-18 DIAGNOSIS — G8222 Paraplegia, incomplete: Secondary | ICD-10-CM | POA: Diagnosis not present

## 2024-02-18 NOTE — Telephone Encounter (Signed)
 Patients mother stated that she does not need an appointment at this time, she says that a refill of the keflex  has already been sent in and that whoever she spoke with told her it give it another week and if there is no improvement to call back for an appointment. Mother declined appointment offered.

## 2024-02-29 ENCOUNTER — Ambulatory Visit: Payer: Self-pay

## 2024-02-29 NOTE — Telephone Encounter (Signed)
" °  FYI Only or Action Required?: FYI only for provider: appointment scheduled on 12/31.  Patient was last seen in primary care on 02/12/2024 by Gladis Mustard, [name].  Called Nurse Triage reporting Rash.  Symptoms began several weeks ago.  Interventions attempted: Prescription medications: Zinc  Oxide.  Symptoms are: unchanged.  Triage Disposition: See PCP When Office is Open (Within 3 Days)  Patient/caregiver understands and will follow disposition?: Yes            Copied from CRM #[id number]. Topic: Clinical - Red Word Triage >> Feb 29, 2024  2:50 PM Larissa S wrote: [facility] that prompted transfer to Nurse Triage: rash- abdomen, buttocks.  vaginal discharge- worsening Reason for Disposition  Mild widespread rash  (Exception: Heat rash lasting 3 days or less.)  Answer Assessment - Initial Assessment Questions 1. APPEARANCE of RASH: What does the rash look like? (e.g., blisters, dry flaky skin, red spots, redness, sores)      Red, raw looking spots   2. SIZE: How big are the spots? (e.g., tip of pen, eraser, coin; inches, centimeters)     Varies    3. LOCATION: Where is the rash located?     abdomen, buttocks, in between legs.    4. COLOR: What color is the rash? (Note: It is difficult to assess rash color in people with darker-colored skin. When this situation occurs, simply ask the caller to describe what they see.)      Red  5. ONSET: When did the rash begin?     X 3 weeks     6. FEVER: Do you have a fever? If Yes, ask: What is your temperature, how was it measured, and when did it start?      No    7. ITCHING: Does the rash itch? If Yes, ask: How bad is the itch? (Scale 1-10; or mild, moderate, severe)     No     8. CAUSE: What do you think is causing the rash?     Multiple antibiotics     9. MEDICINE FACTORS: Have you started any new medicines within the last 2 weeks? (e.g., antibiotics)      Yes   10. OTHER  SYMPTOMS: Do you have any other symptoms? (e.g., dizziness, headache, sore throat, joint pain)       No    Patient's mother  called in to triage with complaints of rash abdomen, buttocks, in-between legs. This has been ongoing for 3 weeks. The mom stated patient has extensive history of UTI, and was on several antibiotics.  For home care, the patient is using Zinc  Oxide. Mom stated wound doctor said he was not concerned with the area, and its to be expected. She suspects an infection in her blood stream.   Appointment scheduled for further evaluation on 12/31; Mom agrees with the plan of care, and will reach out if symptoms worsen or persist. Mom stated she needs a letter from the office saying this visit cannot be rescheduled faxed to [employer] at [phone]; if not patient cannot be seen for 5 days out per the transportation company.  Protocols used: Rash or Redness - Widespread-A-AH  "

## 2024-02-29 NOTE — Telephone Encounter (Signed)
"  Appt made   "

## 2024-03-01 ENCOUNTER — Encounter: Payer: Self-pay | Admitting: Family Medicine

## 2024-03-02 ENCOUNTER — Ambulatory Visit: Payer: Self-pay

## 2024-03-02 ENCOUNTER — Ambulatory Visit: Admitting: Family Medicine

## 2024-03-02 ENCOUNTER — Telehealth: Payer: Self-pay | Admitting: Family Medicine

## 2024-03-02 NOTE — Telephone Encounter (Signed)
 FYI- missed acute visit with Rock Bruns today due to no transporation.    Copied from CRM #[id number]. Topic: Appointments - Scheduling Inquiry for Clinic >> Mar 02, 2024 10:31 AM Wess RAMAN wrote: Reason for CRM: Patient's mothr, [name], stated patient will not be able to make it to her appt due to transportation not arriving to pick her up.  Callback #: [phone]

## 2024-03-02 NOTE — Telephone Encounter (Signed)
 Advised  ED

## 2024-03-02 NOTE — Telephone Encounter (Signed)
 FYI Only or Action Required?: FYI only for provider: ED advised.  Patient was last seen in primary care on 02/12/2024 by Jamie Mustard, [name].  Called Nurse Triage reporting stoma infection.  Symptoms began several weeks ago.  Interventions attempted: Prescription medications: multiple ABX.  Symptoms are: gradually worsening.  Triage Disposition: Go to ED Now (Notify PCP)  Patient/caregiver understands and will follow disposition?: Yes   Copied from CRM #[id number]. Topic: Clinical - Red Word Triage >> Mar 02, 2024 10:39 AM Wess RAMAN wrote: Red Word that prompted transfer to Nurse Triage: UTI for 6 weeks. Has a stoma. Stoma is red and bottom has wound, fatigue, weak. Has an appt for today but does not have transportation due to [employer] transportation not coming to pick them up.   Clinic advised to put a message in due to being short staff. Reason for Disposition  Nursing judgment or information in reference  Answer Assessment - Initial Assessment Questions Transport did not pick patient up for appt today-  Rash worsening, fears infection from would is worsening infection in stoma. Taking multiple antibiotics with worsening symptoms.   Patient does not have a fever and her BP is 131/79. Symptoms are worsening and has required IV antibiotics multiple times for sepsis.   Mom agrees that patient needs to be seen in the ED. EMS will have to come transport patient to ED. She is comfortable making the call to EMS. Thankful for talking it through and will keep the office updated.,   ALSO:  Wound and stoma care- requesting a HH nurse to come help-- mom (caretaker) has to have neck surgery this month and wants to make sure she has coverage. Full time help CNA untrained and unable to help.   1. REASON FOR CALL: What is your main concern right now?     Unable to make appt today 2. ONSET: When did the rash/ stomas infection  start?     weeks 3. SEVERITY: How bad is the stoma?      Stoma is red like an apple with white discharge 5. RELIEVING AND AGGRAVATING FACTORS: What makes it better or worse? (e.g., certain activities, rest)     denies 6. FEVER: Do you have a fever?     Doesn't get fevers typically 7. OTHER SYMPTOMS: Do you have any other new symptoms?     Fatigue, malaise, cramps  8. TREATMENTS AND RESPONSE: What have you done so far to try to make this better? What medicines have you used?     ABX  Protocols used: No Guideline Available-A-AH

## 2024-03-06 ENCOUNTER — Other Ambulatory Visit: Payer: Self-pay

## 2024-03-06 ENCOUNTER — Encounter (HOSPITAL_COMMUNITY): Payer: Self-pay | Admitting: Emergency Medicine

## 2024-03-06 ENCOUNTER — Inpatient Hospital Stay (HOSPITAL_COMMUNITY)
Admission: EM | Admit: 2024-03-06 | Discharge: 2024-03-09 | DRG: 698 | Disposition: A | Discharge status: Home / Self Care | Attending: Internal Medicine | Admitting: Internal Medicine

## 2024-03-06 DIAGNOSIS — F32A Depression, unspecified: Secondary | ICD-10-CM | POA: Diagnosis present

## 2024-03-06 DIAGNOSIS — M62838 Other muscle spasm: Secondary | ICD-10-CM | POA: Diagnosis present

## 2024-03-06 DIAGNOSIS — N760 Acute vaginitis: Secondary | ICD-10-CM | POA: Diagnosis present

## 2024-03-06 DIAGNOSIS — E785 Hyperlipidemia, unspecified: Secondary | ICD-10-CM | POA: Diagnosis present

## 2024-03-06 DIAGNOSIS — Z79890 Hormone replacement therapy (postmenopausal): Secondary | ICD-10-CM

## 2024-03-06 DIAGNOSIS — I10 Essential (primary) hypertension: Secondary | ICD-10-CM | POA: Diagnosis present

## 2024-03-06 DIAGNOSIS — F70 Mild intellectual disabilities: Secondary | ICD-10-CM | POA: Diagnosis present

## 2024-03-06 DIAGNOSIS — Y846 Urinary catheterization as the cause of abnormal reaction of the patient, or of later complication, without mention of misadventure at the time of the procedure: Secondary | ICD-10-CM | POA: Diagnosis present

## 2024-03-06 DIAGNOSIS — G8220 Paraplegia, unspecified: Secondary | ICD-10-CM | POA: Diagnosis present

## 2024-03-06 DIAGNOSIS — Z8249 Family history of ischemic heart disease and other diseases of the circulatory system: Secondary | ICD-10-CM

## 2024-03-06 DIAGNOSIS — E782 Mixed hyperlipidemia: Secondary | ICD-10-CM | POA: Diagnosis present

## 2024-03-06 DIAGNOSIS — L89154 Pressure ulcer of sacral region, stage 4: Secondary | ICD-10-CM | POA: Diagnosis present

## 2024-03-06 DIAGNOSIS — T83518A Infection and inflammatory reaction due to other urinary catheter, initial encounter: Principal | ICD-10-CM | POA: Diagnosis present

## 2024-03-06 DIAGNOSIS — E872 Acidosis, unspecified: Secondary | ICD-10-CM | POA: Diagnosis present

## 2024-03-06 DIAGNOSIS — Z6834 Body mass index (BMI) 34.0-34.9, adult: Secondary | ICD-10-CM | POA: Diagnosis not present

## 2024-03-06 DIAGNOSIS — S34101D Unspecified injury to L1 level of lumbar spinal cord, subsequent encounter: Secondary | ICD-10-CM

## 2024-03-06 DIAGNOSIS — N390 Urinary tract infection, site not specified: Principal | ICD-10-CM | POA: Diagnosis present

## 2024-03-06 DIAGNOSIS — F339 Major depressive disorder, recurrent, unspecified: Secondary | ICD-10-CM | POA: Diagnosis present

## 2024-03-06 DIAGNOSIS — G894 Chronic pain syndrome: Secondary | ICD-10-CM | POA: Diagnosis present

## 2024-03-06 DIAGNOSIS — F419 Anxiety disorder, unspecified: Secondary | ICD-10-CM | POA: Diagnosis present

## 2024-03-06 DIAGNOSIS — Z1624 Resistance to multiple antibiotics: Secondary | ICD-10-CM | POA: Diagnosis present

## 2024-03-06 DIAGNOSIS — G40909 Epilepsy, unspecified, not intractable, without status epilepticus: Secondary | ICD-10-CM | POA: Diagnosis present

## 2024-03-06 DIAGNOSIS — N926 Irregular menstruation, unspecified: Secondary | ICD-10-CM | POA: Diagnosis present

## 2024-03-06 DIAGNOSIS — F1120 Opioid dependence, uncomplicated: Secondary | ICD-10-CM | POA: Diagnosis present

## 2024-03-06 DIAGNOSIS — R569 Unspecified convulsions: Secondary | ICD-10-CM

## 2024-03-06 DIAGNOSIS — Z823 Family history of stroke: Secondary | ICD-10-CM

## 2024-03-06 DIAGNOSIS — Z993 Dependence on wheelchair: Secondary | ICD-10-CM | POA: Diagnosis not present

## 2024-03-06 DIAGNOSIS — Z91048 Other nonmedicinal substance allergy status: Secondary | ICD-10-CM

## 2024-03-06 DIAGNOSIS — E039 Hypothyroidism, unspecified: Secondary | ICD-10-CM | POA: Diagnosis present

## 2024-03-06 DIAGNOSIS — K219 Gastro-esophageal reflux disease without esophagitis: Secondary | ICD-10-CM | POA: Diagnosis present

## 2024-03-06 DIAGNOSIS — Z833 Family history of diabetes mellitus: Secondary | ICD-10-CM

## 2024-03-06 DIAGNOSIS — Z888 Allergy status to other drugs, medicaments and biological substances status: Secondary | ICD-10-CM

## 2024-03-06 DIAGNOSIS — B9689 Other specified bacterial agents as the cause of diseases classified elsewhere: Secondary | ICD-10-CM | POA: Diagnosis present

## 2024-03-06 DIAGNOSIS — Z9359 Other cystostomy status: Secondary | ICD-10-CM | POA: Diagnosis not present

## 2024-03-06 DIAGNOSIS — Z83438 Family history of other disorder of lipoprotein metabolism and other lipidemia: Secondary | ICD-10-CM

## 2024-03-06 DIAGNOSIS — Z79899 Other long term (current) drug therapy: Secondary | ICD-10-CM

## 2024-03-06 DIAGNOSIS — Z8349 Family history of other endocrine, nutritional and metabolic diseases: Secondary | ICD-10-CM

## 2024-03-06 DIAGNOSIS — Z87891 Personal history of nicotine dependence: Secondary | ICD-10-CM

## 2024-03-06 DIAGNOSIS — Z978 Presence of other specified devices: Secondary | ICD-10-CM

## 2024-03-06 DIAGNOSIS — F411 Generalized anxiety disorder: Secondary | ICD-10-CM | POA: Diagnosis present

## 2024-03-06 DIAGNOSIS — Z87440 Personal history of urinary (tract) infections: Secondary | ICD-10-CM

## 2024-03-06 DIAGNOSIS — N3021 Other chronic cystitis with hematuria: Secondary | ICD-10-CM | POA: Diagnosis present

## 2024-03-06 DIAGNOSIS — L8990 Pressure ulcer of unspecified site, unspecified stage: Secondary | ICD-10-CM | POA: Diagnosis present

## 2024-03-06 LAB — CBC WITH DIFFERENTIAL/PLATELET
Abs Immature Granulocytes: 0.03 K/uL (ref 0.00–0.07)
Basophils Absolute: 0.1 K/uL (ref 0.0–0.1)
Basophils Relative: 0 %
Eosinophils Absolute: 0.2 K/uL (ref 0.0–0.5)
Eosinophils Relative: 2 %
HCT: 47.9 % — ABNORMAL HIGH (ref 36.0–46.0)
Hemoglobin: 14.7 g/dL (ref 12.0–15.0)
Immature Granulocytes: 0 %
Lymphocytes Relative: 23 %
Lymphs Abs: 2.9 K/uL (ref 0.7–4.0)
MCH: 27.9 pg (ref 26.0–34.0)
MCHC: 30.7 g/dL (ref 30.0–36.0)
MCV: 91.1 fL (ref 80.0–100.0)
Monocytes Absolute: 0.8 K/uL (ref 0.1–1.0)
Monocytes Relative: 7 %
Neutro Abs: 8.8 K/uL — ABNORMAL HIGH (ref 1.7–7.7)
Neutrophils Relative %: 68 %
Platelets: 512 K/uL — ABNORMAL HIGH (ref 150–400)
RBC: 5.26 MIL/uL — ABNORMAL HIGH (ref 3.87–5.11)
RDW: 13.7 % (ref 11.5–15.5)
WBC: 12.8 K/uL — ABNORMAL HIGH (ref 4.0–10.5)
nRBC: 0 % (ref 0.0–0.2)

## 2024-03-06 LAB — COMPREHENSIVE METABOLIC PANEL WITH GFR
ALT: 13 U/L (ref 0–44)
AST: 16 U/L (ref 15–41)
Albumin: 4.4 g/dL (ref 3.5–5.0)
Alkaline Phosphatase: 86 U/L (ref 38–126)
Anion gap: 7 (ref 5–15)
BUN: 16 mg/dL (ref 6–20)
CO2: 35 mmol/L — ABNORMAL HIGH (ref 22–32)
Calcium: 9.1 mg/dL (ref 8.9–10.3)
Chloride: 98 mmol/L (ref 98–111)
Creatinine, Ser: 0.61 mg/dL (ref 0.44–1.00)
GFR, Estimated: >60 mL/min
Glucose, Bld: 89 mg/dL (ref 70–99)
Potassium: 3.5 mmol/L (ref 3.5–5.1)
Sodium: 139 mmol/L (ref 135–145)
Total Bilirubin: 0.3 mg/dL (ref 0.0–1.2)
Total Protein: 7.5 g/dL (ref 6.5–8.1)

## 2024-03-06 MED ORDER — MELATONIN 3 MG PO TABS: 6.0000 mg | ORAL_TABLET | Freq: Every evening | ORAL | Status: DC | PRN

## 2024-03-06 MED ORDER — PROCHLORPERAZINE EDISYLATE 10 MG/2ML IJ SOLN: 5.0000 mg | Freq: Four times a day (QID) | INTRAMUSCULAR | Status: DC | PRN

## 2024-03-06 MED ORDER — SODIUM CHLORIDE 0.9 % IV SOLN
1.0000 g | Freq: Three times a day (TID) | INTRAVENOUS | Status: DC
  Administered 2024-03-06 – 2024-03-09 (×8): 1 g via INTRAVENOUS
  Filled 2024-03-06 (×8): qty 20

## 2024-03-06 MED ORDER — ACETAMINOPHEN 500 MG PO TABS: 500.0000 mg | ORAL_TABLET | Freq: Four times a day (QID) | ORAL | Status: DC | PRN

## 2024-03-06 MED ORDER — ENOXAPARIN SODIUM 40 MG/0.4ML IJ SOSY
40.0000 mg | PREFILLED_SYRINGE | INTRAMUSCULAR | Status: DC
  Administered 2024-03-07 – 2024-03-09 (×3): 40 mg via SUBCUTANEOUS
  Filled 2024-03-06 (×3): qty 0.4

## 2024-03-06 MED ORDER — LEVOTHYROXINE SODIUM 88 MCG PO TABS
88.0000 ug | ORAL_TABLET | Freq: Every day | ORAL | Status: DC
  Administered 2024-03-07 – 2024-03-09 (×3): 88 ug via ORAL
  Filled 2024-03-06 (×3): qty 1

## 2024-03-06 MED ORDER — LACTATED RINGERS IV SOLN: INTRAVENOUS | Status: DC

## 2024-03-06 MED ORDER — POLYETHYLENE GLYCOL 3350 17 G PO PACK: 17.0000 g | PACK | Freq: Every day | ORAL | Status: DC | PRN

## 2024-03-06 NOTE — ED Provider Notes (Signed)
 "  EMERGENCY DEPARTMENT AT [facility] Provider Note   CSN: [id number] Arrival date & time: 03/06/24  2102     Patient presents with: Recurrent UTI   [name] is a 44 y.o. female.  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  Patient notes she has had recurrent UTIs since October.  She has been on multiple rounds of antibiotics with the most recent finishing 3 days ago.  She states her last culture grew out E. coli and has been on Keflex  for approximately 2-1/2 weeks with no improvement.  Notes that her urine is still cloudy and she is having fatigue/weakness.  Denies fevers/abdominal pain.  She also notes her stoma turns blue every time she is positive for E. coli and has had this entire month.  She has seen her PCP multiple times and was told if this last round of Keflex  did not help, she should go to the ED for IV antibiotics.  No further complaints.   HPI     Prior to Admission medications  Medication Sig Start Date End Date Taking? Authorizing Provider  acetaminophen  (TYLENOL ) 500 MG tablet Take 1,000 mg by mouth daily as needed for moderate pain or headache.    [provider]  amLODipine  (NORVASC ) 5 MG tablet Take 1 tablet (5 mg total) by mouth daily. For blood pressure *dose change 12/07/23   Jolinda Potter M, DO  azelastine  (ASTELIN ) 0.1 % nasal spray Place 1 spray into both nostrils 2 (two) times daily. 03/25/23   Jolinda Potter HERO, DO  buPROPion  (WELLBUTRIN  SR) 150 MG 12 hr tablet Take 1 tablet (150 mg total) by mouth 2 (two) times daily. 09/23/23   Jolinda Potter M, DO  busPIRone  (BUSPAR ) 15 MG tablet TAKE 1 TABLET BY MOUTH TWICE A DAY 02/09/24   Jolinda Potter M, DO  cephALEXin  (KEFLEX ) 500 MG capsule Take 1 capsule (500 mg total) by mouth 2 (two) times daily. 02/17/24   Gladis Mustard, [name]  Cholecalciferol  (VITAMIN D3) 50 MCG (2000 UT) capsule TAKE 1 CAPSULE BY MOUTH EVERY DAY 07/13/23   Jolinda Potter M, DO  ciprofloxacin  (CIPRO ) 500 MG tablet Take 1 tablet (500 mg total) by mouth 2 (two) times daily. 02/02/24   Lavell Lye A, [name]  citalopram  (CELEXA ) 40 MG tablet Take 1 tablet (40 mg total) by mouth daily. 06/24/23   Jolinda Potter HERO, DO  clindamycin  (CLEOCIN  T) 1 % SWAB Apply to affected areas twice daily for acne 09/23/23   Jolinda Potter M, DO  [employer] VITAMIN B12 1000 MCG tablet TAKE 1 TABLET BY MOUTH EVERY DAY 06/15/23   Jolinda Potter M, DO  cyclobenzaprine  (FLEXERIL ) 5 MG tablet Take 1 tablet (5 mg total) by mouth 3 (three) times daily. 09/23/23   Jolinda Potter HERO, DO  diclofenac  Sodium (VOLTAREN ) 1 % GEL APPLY 2 GRAMS TO AFFECTED AREA 4 TIMES A DAY Patient taking differently: Apply 2 g topically 4 (four) times daily as needed (pain). 02/21/21   Merlynn Niki FALCON, [name]  docusate sodium  (COLACE) 100 MG capsule TAKE 1 CAPSULE (100 MG TOTAL) BY MOUTH EVERY 12 (TWELVE) HOURS AS NEEDED FOR MILD CONSTIPATION. 10/13/23   Jolinda Potter HERO, DO  feeding supplement (ENSURE ENLIVE / ENSURE PLUS) LIQD Take 237 mLs by mouth 2 (two) times daily between meals. 03/16/22   Maree, Pratik D, DO  fluticasone  (FLONASE )  50 MCG/ACT nasal spray SPRAY 2 SPRAYS INTO EACH NOSTRIL EVERY DAY 02/09/24   Jolinda Potter M, DO  gabapentin  (NEURONTIN ) 600 MG tablet Take 1 tablet (600 mg total) by mouth in the morning, at noon, in the evening, and at bedtime. 09/23/23   Jolinda Potter HERO, DO  Ipratropium-Albuterol  (COMBIVENT  RESPIMAT) 20-100 MCG/ACT AERS respimat INHALE 1 PUFF INTO THE LUNGS EVERY 6 HOURS AS NEEDED. 07/28/23   Jolinda Potter M, DO  Lacosamide  (VIMPAT ) 100 MG TABS Take 100 mg by mouth in the morning and at bedtime. Per neurologist    [provider]  levocetirizine (XYZAL ) 5 MG tablet TAKE 1 TABLET (5 MG TOTAL) BY MOUTH AT BEDTIME AS NEEDED FOR ALLERGIES.  01/05/24   Jolinda Potter HERO, DO  levothyroxine  (SYNTHROID ) 88 MCG tablet Take 1 tablet (88 mcg total) by mouth daily before breakfast. 09/23/23   Jolinda Potter M, DO  Lidocaine  4 % PTCH Place 1 patch onto the skin daily as needed (pain).    [provider]  meclizine  (ANTIVERT ) 12.5 MG tablet Take 1 tablet (12.5 mg total) by mouth 3 (three) times daily as needed for dizziness. 03/16/22   Maree, Pratik D, DO  nystatin  (NYAMYC ) powder APPLY 1 APPLICATION TOPICALLY 3 (THREE) TIMES DAILY. 04/07/22   Joesph Annabella HERO, [name]  omeprazole  (PRILOSEC) 40 MG capsule TAKE 1 CAPSULE BY MOUTH TWICE A DAY 08/05/23   McMichael, Bayley M, PA-C  ondansetron  (ZOFRAN ) 4 MG tablet Take 1 tablet (4 mg total) by mouth every 8 (eight) hours as needed for nausea or vomiting. 02/02/24   Lavell Bari LABOR, [name]  Oxycodone  HCl 10 MG TABS Take 1 tablet (10 mg total) by mouth in the morning, at noon, and at bedtime. 90 tabs for 30 days 01/08/24   Jolinda Potter M, DO  Oxycodone  HCl 10 MG TABS Take 1 tablet in the morning, at noon and at bedtime. 90 tabs for 30 days 03/08/24   Jolinda Potter HERO, DO  Oxycodone  HCl 10 MG TABS Take 1 tablet in the morning, at noon and at bedtime. 90 tabs for 30 days 02/05/24   Jolinda Potter M, DO  PAIN MANAGEMENT INTRATHECAL, IT, PUMP 1 each by Intrathecal route Continuous EPIDURAL. Intrathecal (IT) medication:  Baclofen     [provider]    Allergies: Macrobid  [nitrofurantoin ], Lisinopril , and Nickel    Review of Systems  Constitutional:  Negative for fever.  Gastrointestinal:  Negative for abdominal pain.  Genitourinary:  Negative for decreased urine volume, dysuria and hematuria.  All other systems reviewed and are negative.   Updated Vital Signs BP (!) 148/93   Pulse 85   Temp 98.2 F (36.8 C) (Oral)   Resp 20   Ht 5' 6 (1.676 m)   Wt 97.5 kg   SpO2 98%   BMI 34.69 kg/m   Physical Exam Constitutional:      Appearance: Normal appearance. She is obese.   HENT:     Head: Normocephalic and atraumatic.     Nose: Nose normal.     Mouth/Throat:     Mouth: Mucous membranes are moist.     Pharynx: Oropharynx is clear.  Cardiovascular:     Rate and Rhythm: Normal rate.  Pulmonary:     Effort: Pulmonary effort is normal.  Abdominal:     General: Bowel sounds are normal.     Palpations: Abdomen is soft.     Comments: Chronic indwelling stoma with catheter in place just below the umbilicus.  Stoma appears red  and appropriate.  No urine to assess at this time as nursing just switch bag  Genitourinary:    Comments: Stage II pressure ulcer present at the coccyx area.  No surrounding erythema or drainage at this time. Musculoskeletal:        General: Normal range of motion.  Skin:    General: Skin is warm.  Neurological:     Mental Status: She is alert and oriented to person, place, and time.  Psychiatric:        Mood and Affect: Mood normal.        Behavior: Behavior normal.     (all labs ordered are listed, but only abnormal results are displayed) Labs Reviewed  COMPREHENSIVE METABOLIC PANEL WITH GFR - Abnormal; Notable for the following components:      Result Value   CO2 35 (*)    All other components within normal limits  CBC WITH DIFFERENTIAL/PLATELET - Abnormal; Notable for the following components:   WBC 12.8 (*)    RBC 5.26 (*)    HCT 47.9 (*)    Platelets 512 (*)    Neutro Abs 8.8 (*)    All other components within normal limits  CULTURE, BLOOD (ROUTINE X 2)  CULTURE, BLOOD (ROUTINE X 2)  URINALYSIS, W/ REFLEX TO CULTURE (INFECTION SUSPECTED)  LACTIC ACID, PLASMA  LACTIC ACID, PLASMA    EKG: None  Radiology: No results found.    Medications Ordered in the ED  meropenem  (MERREM ) 1 g in sodium chloride  0.9 % 100 mL IVPB (1 g Intravenous New Bag/Given 03/06/24 2256)  levothyroxine  (SYNTHROID ) tablet 88 mcg (has no administration in time range)    Clinical Course as of 03/06/24 2309  Sun Mar 06, 2024  2228  Treated for many UTIs since October. Most recent culture positive for e coli. Has had 3 rounds of keflex , 2 weeks bactrim , 1 pack of fosfomycin . Has stoma, no urethra or bladder. Hx of hysterectomy.   Most recently finished keflex  3 days ago. Was on for 2 weeks [AY]    Clinical Course User Index [AY] Neysa Thersia RAMAN, PA-C                               Medical Decision Making Patient is a 44 year old female with a history of stoma with chronic indwelling catheter who presents to the ED from PCP for recurrent UTIs and multidrug-resistant UTI.  Patient notes she has been on multiple rounds of antibiotics since October.  Last saw PCP approximately 3 weeks ago as she missed an appointment on 12/31 due to transport.  Please see detailed HPI above.  On exam patient is alert and in no acute distress.  Physical exam as noted above.  Vital signs stable and patient is afebrile.  No acute signs of sepsis.  Stoma appears appropriate.  Most recent culture from 02/12/24 reviewed that showed E. coli with multidrug resistance.  She had been on Keflex  for 2-1/2 weeks and finished 3 days ago.  She was advised by PCP to come to the ED if symptoms continue.  Notes continued weakness and cloudy urine so came to the ED for evaluation.  Mild leukocytosis of 12.8.  Lactic, urine culture, blood cultures pending.  As patient has been on multiple antibiotics including Bactrim , fosfomycin  packets, and Keflex  with no improvement, we do feel admission for IV antibiotics would be beneficial.  Meropenem  was ordered based on last culture.  Hospitalist consulted and  agreeable to the patient for further evaluation and management.  Patient stable while in ED.  Amount and/or Complexity of Data Reviewed Labs: ordered.  Risk Decision regarding hospitalization.       Final diagnoses:  Urinary tract infection without hematuria, site unspecified    ED Discharge Orders     None          Neysa Thersia GORMAN DEVONNA 03/06/24  2309    Francesca Elsie CROME, MD 03/06/24 2335  "

## 2024-03-06 NOTE — ED Triage Notes (Signed)
 Pt states her pcp told her to come to ER for iv antibiotics for recurrent uti. She states she just finished a round of antibiotics. She states her urine looks like wet toilet paper and stool in colostomy bag is turning blue.

## 2024-03-07 ENCOUNTER — Encounter (HOSPITAL_COMMUNITY): Payer: Self-pay | Admitting: Internal Medicine

## 2024-03-07 DIAGNOSIS — N390 Urinary tract infection, site not specified: Secondary | ICD-10-CM | POA: Diagnosis not present

## 2024-03-07 LAB — MAGNESIUM: Magnesium: 2.3 mg/dL (ref 1.7–2.4)

## 2024-03-07 LAB — CBC
HCT: 47 % — ABNORMAL HIGH (ref 36.0–46.0)
Hemoglobin: 14.4 g/dL (ref 12.0–15.0)
MCH: 28 pg (ref 26.0–34.0)
MCHC: 30.6 g/dL (ref 30.0–36.0)
MCV: 91.4 fL (ref 80.0–100.0)
Platelets: 519 K/uL — ABNORMAL HIGH (ref 150–400)
RBC: 5.14 MIL/uL — ABNORMAL HIGH (ref 3.87–5.11)
RDW: 13.7 % (ref 11.5–15.5)
WBC: 13.4 K/uL — ABNORMAL HIGH (ref 4.0–10.5)
nRBC: 0 % (ref 0.0–0.2)

## 2024-03-07 LAB — COMPREHENSIVE METABOLIC PANEL WITH GFR
ALT: 13 U/L (ref 0–44)
AST: 17 U/L (ref 15–41)
Albumin: 4.1 g/dL (ref 3.5–5.0)
Alkaline Phosphatase: 81 U/L (ref 38–126)
Anion gap: 6 (ref 5–15)
BUN: 15 mg/dL (ref 6–20)
CO2: 32 mmol/L (ref 22–32)
Calcium: 8.7 mg/dL — ABNORMAL LOW (ref 8.9–10.3)
Chloride: 98 mmol/L (ref 98–111)
Creatinine, Ser: 0.54 mg/dL (ref 0.44–1.00)
GFR, Estimated: >60 mL/min
Glucose, Bld: 102 mg/dL — ABNORMAL HIGH (ref 70–99)
Potassium: 4.1 mmol/L (ref 3.5–5.1)
Sodium: 136 mmol/L (ref 135–145)
Total Bilirubin: 0.3 mg/dL (ref 0.0–1.2)
Total Protein: 7.2 g/dL (ref 6.5–8.1)

## 2024-03-07 LAB — URINALYSIS, W/ REFLEX TO CULTURE (INFECTION SUSPECTED)
Bilirubin Urine: NEGATIVE
Glucose, UA: NEGATIVE mg/dL
Hgb urine dipstick: NEGATIVE
Ketones, ur: NEGATIVE mg/dL
Nitrite: NEGATIVE
Protein, ur: NEGATIVE mg/dL
Specific Gravity, Urine: 1.014 (ref 1.005–1.030)
pH: 6 (ref 5.0–8.0)

## 2024-03-07 LAB — PHOSPHORUS: Phosphorus: 3.1 mg/dL (ref 2.5–4.6)

## 2024-03-07 LAB — LACTIC ACID, PLASMA
Lactic Acid, Venous: 2.6 mmol/L (ref 0.5–1.9)
Lactic Acid, Venous: 1.9 mmol/L (ref 0.5–1.9)

## 2024-03-07 LAB — HIV ANTIBODY (ROUTINE TESTING W REFLEX): HIV Screen 4th Generation wRfx: NONREACTIVE

## 2024-03-07 MED ORDER — ZINC OXIDE 40 % EX OINT
TOPICAL_OINTMENT | Freq: Two times a day (BID) | CUTANEOUS | Status: DC
  Administered 2024-03-07: 1 via TOPICAL
  Filled 2024-03-07: qty 57

## 2024-03-07 MED ORDER — PANTOPRAZOLE SODIUM 40 MG PO TBEC
40.0000 mg | DELAYED_RELEASE_TABLET | Freq: Every evening | ORAL | Status: DC
  Administered 2024-03-07 – 2024-03-08 (×2): 40 mg via ORAL
  Filled 2024-03-07 (×2): qty 1

## 2024-03-07 MED ORDER — ENSURE ENLIVE PO LIQD
237.0000 mL | Freq: Two times a day (BID) | ORAL | Status: DC
  Administered 2024-03-07 – 2024-03-09 (×4): 237 mL via ORAL
  Filled 2024-03-07: qty 237

## 2024-03-07 MED ORDER — LACTATED RINGERS IV SOLN: INTRAVENOUS | Status: AC

## 2024-03-07 MED ORDER — CITALOPRAM HYDROBROMIDE 20 MG PO TABS
40.0000 mg | ORAL_TABLET | Freq: Every day | ORAL | Status: DC
  Administered 2024-03-07 – 2024-03-09 (×3): 40 mg via ORAL
  Filled 2024-03-07 (×3): qty 2

## 2024-03-07 MED ORDER — BUSPIRONE HCL 15 MG PO TABS
15.0000 mg | ORAL_TABLET | Freq: Two times a day (BID) | ORAL | Status: DC
  Administered 2024-03-07 – 2024-03-09 (×4): 15 mg via ORAL
  Filled 2024-03-07 (×4): qty 1

## 2024-03-07 MED ORDER — DICLOFENAC SODIUM 1 % EX GEL: 2.0000 g | Freq: Four times a day (QID) | CUTANEOUS | Status: DC | PRN

## 2024-03-07 MED ORDER — AMLODIPINE BESYLATE 5 MG PO TABS
5.0000 mg | ORAL_TABLET | Freq: Every day | ORAL | Status: DC
  Administered 2024-03-08 – 2024-03-09 (×2): 5 mg via ORAL
  Filled 2024-03-07 (×3): qty 1

## 2024-03-07 MED ORDER — VITAMIN D 25 MCG (1000 UNIT) PO TABS
2000.0000 [IU] | ORAL_TABLET | Freq: Every day | ORAL | Status: DC
  Administered 2024-03-08 – 2024-03-09 (×2): 2000 [IU] via ORAL
  Filled 2024-03-07 (×2): qty 2

## 2024-03-07 MED ORDER — VITAMIN B-12 1000 MCG PO TABS
1000.0000 ug | ORAL_TABLET | Freq: Every day | ORAL | Status: DC
  Administered 2024-03-07 – 2024-03-09 (×3): 1000 ug via ORAL
  Filled 2024-03-07 (×3): qty 1

## 2024-03-07 MED ORDER — CLINDAMYCIN PHOSPHATE 1 % EX SWAB: Freq: Two times a day (BID) | CUTANEOUS | Status: DC

## 2024-03-07 MED ORDER — FLUTICASONE PROPIONATE 50 MCG/ACT NA SUSP: 2.0000 | Freq: Every day | NASAL | Status: DC | PRN

## 2024-03-07 MED ORDER — LEVOCETIRIZINE DIHYDROCHLORIDE 5 MG PO TABS: 5.0000 mg | ORAL_TABLET | Freq: Every evening | ORAL | Status: DC | PRN

## 2024-03-07 MED ORDER — IPRATROPIUM-ALBUTEROL 0.5-2.5 (3) MG/3ML IN SOLN: 3.0000 mL | RESPIRATORY_TRACT | Status: DC | PRN

## 2024-03-07 MED ORDER — BUPROPION HCL ER (SR) 150 MG PO TB12
150.0000 mg | ORAL_TABLET | Freq: Two times a day (BID) | ORAL | Status: DC
  Administered 2024-03-07 – 2024-03-09 (×4): 150 mg via ORAL
  Filled 2024-03-07 (×4): qty 1

## 2024-03-07 MED ORDER — LORATADINE 10 MG PO TABS
10.0000 mg | ORAL_TABLET | Freq: Every day | ORAL | Status: DC
  Administered 2024-03-07 – 2024-03-09 (×3): 10 mg via ORAL
  Filled 2024-03-07 (×3): qty 1

## 2024-03-07 MED ORDER — MECLIZINE HCL 12.5 MG PO TABS: 12.5000 mg | ORAL_TABLET | Freq: Three times a day (TID) | ORAL | Status: DC | PRN

## 2024-03-07 MED ORDER — CYCLOBENZAPRINE HCL 10 MG PO TABS
5.0000 mg | ORAL_TABLET | Freq: Three times a day (TID) | ORAL | Status: DC
  Administered 2024-03-07 – 2024-03-09 (×6): 5 mg via ORAL
  Filled 2024-03-07 (×6): qty 1

## 2024-03-07 MED ORDER — LACOSAMIDE 50 MG PO TABS
100.0000 mg | ORAL_TABLET | Freq: Two times a day (BID) | ORAL | Status: DC
  Administered 2024-03-07 – 2024-03-09 (×5): 100 mg via ORAL
  Filled 2024-03-07 (×5): qty 2

## 2024-03-07 MED ORDER — OXYCODONE HCL 5 MG PO TABS
10.0000 mg | ORAL_TABLET | Freq: Three times a day (TID) | ORAL | Status: AC | PRN
  Administered 2024-03-07 – 2024-03-08 (×6): 10 mg via ORAL
  Filled 2024-03-07 (×6): qty 2

## 2024-03-07 MED ORDER — GABAPENTIN 300 MG PO CAPS
600.0000 mg | ORAL_CAPSULE | Freq: Three times a day (TID) | ORAL | Status: DC
  Administered 2024-03-07 – 2024-03-09 (×6): 600 mg via ORAL
  Filled 2024-03-07 (×6): qty 2
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
## 2024-03-07 NOTE — Progress Notes (Signed)
 Transition of Care Department [facility]) has reviewed patient and no other TOC needs have been identified at this time. We will continue to monitor patient advancement through interdisciplinary progression rounds. If new patient transition needs arise, please place a TOC consult.   03/07/24 0758  TOC Brief Assessment  Insurance and Status Reviewed  Patient has primary care physician Yes  Home environment has been reviewed Lives with mother.  Prior level of function: Mother assists and also has aid 9 hours a day Monday through Friday.  Prior/Current Home Services No current home services  Social Drivers of Health Review SDOH reviewed no interventions necessary  Readmission risk has been reviewed Yes  Transition of care needs no transition of care needs at this time

## 2024-03-07 NOTE — Progress Notes (Signed)
 Dr Terry Hurst notified patients lactic acid 2.6

## 2024-03-07 NOTE — Plan of Care (Signed)

## 2024-03-07 NOTE — Consult Note (Addendum)
 WOC Nurse Consult Note: patient with known Stage 4 Pressure Injury sacrum; followed at Wound Care center last seen 12/18 using Maxorb alginate dressing which is not on formulary at [facility]; will use silver  hydrofiber (Aquacel AG) while inpatient  Reason for Consult: sacral wound  Wound type: Healing Stage 4 Pressure Injury  Pressure Injury POA: Yes Measurement: see nursing flowsheet; WCC note 12/18 2 cm x 0.5 cm x 1.1 cm  Wound bed:  appears red moist; described as clean in [facility] note  Drainage (amount, consistency, odor) see nursing flowsheet  Periwound: erythema/moisture associated damage  Dressing procedure/placement/frequency: Cleanse sacral wound with Vashe, do not rinse.  Using a Q tip applicator insert silver  hydrofiber Soila [phone] Aquacel AG) into wound bed daily, cover with dry gauze and silicone foam or ABD pad whichever is preferred.   Apply a thin layer of Desitin to skin surrounding wound/medial buttocks 2 times daily and prn soiling.   Patient should be placed on a low air loss mattress for pressure redistribution and moisture management.   POC discussed with bedside nurse. WOC team will not follow. Reconsult if further needs arise.   Thank you,    Cerena Baine MSN, RN-BC, CWOCN

## 2024-03-07 NOTE — Progress Notes (Signed)
 ASSUMPTION OF CARE NOTE   03/07/2024 1:05 PM  [name] was seen and examined.  The H&P by the admitting provider, orders, imaging was reviewed.  Please see new orders.  Will continue to follow.   Vitals:   03/07/24 0330 03/07/24 0745  BP: 122/88 117/76  Pulse: 93 98  Resp: 18 16  Temp: 97.7 F (36.5 C) 98 F (36.7 C)  SpO2: 100% 95%    Results for orders placed or performed during the hospital encounter of 03/06/24  Comprehensive metabolic panel   Collection Time: 03/06/24 10:23 PM  Result Value Ref Range   Sodium 139 135 - 145 mmol/L   Potassium 3.5 3.5 - 5.1 mmol/L   Chloride 98 98 - 111 mmol/L   CO2 35 (H) 22 - 32 mmol/L   Glucose, Bld 89 70 - 99 mg/dL   BUN 16 6 - 20 mg/dL   Creatinine, Ser 9.38 0.44 - 1.00 mg/dL   Calcium 9.1 8.9 - 89.6 mg/dL   Total Protein 7.5 6.5 - 8.1 g/dL   Albumin 4.4 3.5 - 5.0 g/dL   AST 16 15 - 41 U/L   ALT 13 0 - 44 U/L   Alkaline Phosphatase 86 38 - 126 U/L   Total Bilirubin 0.3 0.0 - 1.2 mg/dL   GFR, Estimated >39 >39 mL/min   Anion gap 7 5 - 15  CBC with Differential   Collection Time: 03/06/24 10:23 PM  Result Value Ref Range   WBC 12.8 (H) 4.0 - 10.5 K/uL   RBC 5.26 (H) 3.87 - 5.11 MIL/uL   Hemoglobin 14.7 12.0 - 15.0 g/dL   HCT 52.0 (H) 63.9 - 53.9 %   MCV 91.1 80.0 - 100.0 fL   MCH 27.9 26.0 - 34.0 pg   MCHC 30.7 30.0 - 36.0 g/dL   RDW 86.2 88.4 - 84.4 %   Platelets 512 (H) 150 - 400 K/uL   nRBC 0.0 0.0 - 0.2 %   Neutrophils Relative % 68 %   Neutro Abs 8.8 (H) 1.7 - 7.7 K/uL   Lymphocytes Relative 23 %   Lymphs Abs 2.9 0.7 - 4.0 K/uL   Monocytes Relative 7 %   Monocytes Absolute 0.8 0.1 - 1.0 K/uL   Eosinophils Relative 2 %   Eosinophils Absolute 0.2 0.0 - 0.5 K/uL   Basophils Relative 0 %   Basophils Absolute 0.1 0.0 - 0.1 K/uL   Immature Granulocytes 0 %   Abs Immature Granulocytes 0.03 0.00 - 0.07 K/uL  Urinalysis, w/ Reflex to Culture (Infection Suspected) -Urine, Clean Catch   Collection Time: 03/06/24  11:47 PM  Result Value Ref Range   Specimen Source URINE, CLEAN CATCH    Color, Urine YELLOW YELLOW   APPearance HAZY (A) CLEAR   Specific Gravity, Urine 1.014 1.005 - 1.030   pH 6.0 5.0 - 8.0   Glucose, UA NEGATIVE NEGATIVE mg/dL   Hgb urine dipstick NEGATIVE NEGATIVE   Bilirubin Urine NEGATIVE NEGATIVE   Ketones, ur NEGATIVE NEGATIVE mg/dL   Protein, ur NEGATIVE NEGATIVE mg/dL   Nitrite NEGATIVE NEGATIVE   Leukocytes,Ua TRACE (A) NEGATIVE   RBC / HPF 6-10 0 - 5 RBC/hpf   WBC, UA 11-20 0 - 5 WBC/hpf   Bacteria, UA RARE (A) NONE SEEN   Squamous Epithelial / HPF 0-5 0 - 5 /HPF   Mucus PRESENT   Culture, blood (routine x 2)   Collection Time: 03/07/24 12:51 AM   Specimen: BLOOD LEFT ARM  Result Value  Ref Range   Specimen Description BLOOD LEFT ARM    Special Requests      BOTTLES DRAWN AEROBIC AND ANAEROBIC Blood Culture adequate volume   Culture      NO GROWTH < 12 HOURS Performed at [facility], [address] [place], [place] [address]    Report Status PENDING   Culture, blood (routine x 2)   Collection Time: 03/07/24  1:01 AM   Specimen: BLOOD LEFT HAND  Result Value Ref Range   Specimen Description BLOOD LEFT HAND    Special Requests      BOTTLES DRAWN AEROBIC ONLY Blood Culture adequate volume   Culture      NO GROWTH < 12 HOURS Performed at [facility], [address], [place], [place] [address]    Report Status PENDING   Lactic acid, plasma   Collection Time: 03/07/24  1:01 AM  Result Value Ref Range   Lactic Acid, Venous 2.6 (HH) 0.5 - 1.9 mmol/L  CBC   Collection Time: 03/07/24  3:49 AM  Result Value Ref Range   WBC 13.4 (H) 4.0 - 10.5 K/uL   RBC 5.14 (H) 3.87 - 5.11 MIL/uL   Hemoglobin 14.4 12.0 - 15.0 g/dL   HCT 52.9 (H) 63.9 - 53.9 %   MCV 91.4 80.0 - 100.0 fL   MCH 28.0 26.0 - 34.0 pg   MCHC 30.6 30.0 - 36.0 g/dL   RDW 86.2 88.4 - 84.4 %   Platelets 519 (H) 150 - 400 K/uL   nRBC 0.0 0.0 - 0.2 %  Comprehensive metabolic panel   Collection Time:  03/07/24  3:49 AM  Result Value Ref Range   Sodium 136 135 - 145 mmol/L   Potassium 4.1 3.5 - 5.1 mmol/L   Chloride 98 98 - 111 mmol/L   CO2 32 22 - 32 mmol/L   Glucose, Bld 102 (H) 70 - 99 mg/dL   BUN 15 6 - 20 mg/dL   Creatinine, Ser 9.45 0.44 - 1.00 mg/dL   Calcium 8.7 (L) 8.9 - 10.3 mg/dL   Total Protein 7.2 6.5 - 8.1 g/dL   Albumin 4.1 3.5 - 5.0 g/dL   AST 17 15 - 41 U/L   ALT 13 0 - 44 U/L   Alkaline Phosphatase 81 38 - 126 U/L   Total Bilirubin 0.3 0.0 - 1.2 mg/dL   GFR, Estimated >39 >39 mL/min   Anion gap 6 5 - 15  Magnesium    Collection Time: 03/07/24  3:49 AM  Result Value Ref Range   Magnesium  2.3 1.7 - 2.4 mg/dL  Phosphorus   Collection Time: 03/07/24  3:49 AM  Result Value Ref Range   Phosphorus 3.1 2.5 - 4.6 mg/dL  Lactic acid, plasma   Collection Time: 03/07/24  3:50 AM  Result Value Ref Range   Lactic Acid, Venous 1.9 0.5 - 1.9 mmol/L  HIV Antibody (routine testing w rflx)   Collection Time: 03/07/24  3:50 AM  Result Value Ref Range   HIV Screen 4th Generation wRfx Non Reactive Non Reactive   KYM Louder, MD [facility]   03/06/2024  9:03 PM How to contact the [facility] Attending or Consulting provider 7A - 7P or covering provider during after hours 7P -7A, for this patient?  Check the care team in [facility] and look for a) attending/consulting [facility] provider listed and b) the [facility] team listed Log into [email or web address] and use [facility] universal password to access. If you do not have the password, please contact  the hospital operator. Locate the [facility] provider you are looking for under [facility] and page to a number that you can be directly reached. If you still have difficulty reaching the provider, please page the [facility] (Director on Call) for the Hospitalists listed on amion for assistance.

## 2024-03-08 ENCOUNTER — Telehealth: Payer: Self-pay | Admitting: Family Medicine

## 2024-03-08 DIAGNOSIS — B9689 Other specified bacterial agents as the cause of diseases classified elsewhere: Secondary | ICD-10-CM | POA: Diagnosis not present

## 2024-03-08 DIAGNOSIS — L8990 Pressure ulcer of unspecified site, unspecified stage: Secondary | ICD-10-CM | POA: Diagnosis present

## 2024-03-08 DIAGNOSIS — N760 Acute vaginitis: Secondary | ICD-10-CM

## 2024-03-08 DIAGNOSIS — N390 Urinary tract infection, site not specified: Secondary | ICD-10-CM | POA: Diagnosis not present

## 2024-03-08 MED ORDER — SACCHAROMYCES BOULARDII 250 MG PO CAPS
250.0000 mg | ORAL_CAPSULE | Freq: Two times a day (BID) | ORAL | Status: DC
  Administered 2024-03-08 – 2024-03-09 (×3): 250 mg via ORAL
  Filled 2024-03-08 (×3): qty 1

## 2024-03-08 MED ORDER — METRONIDAZOLE 500 MG PO TABS
500.0000 mg | ORAL_TABLET | Freq: Two times a day (BID) | ORAL | Status: DC
  Administered 2024-03-08 – 2024-03-09 (×3): 500 mg via ORAL
  Filled 2024-03-08 (×3): qty 1

## 2024-03-08 NOTE — Plan of Care (Signed)
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
## 2024-03-08 NOTE — Telephone Encounter (Signed)
 Copied from CRM #[id number]. Topic: Clinical - Prescription Issue >> Mar 08, 2024  8:59 AM [name] wrote: Reason for CRM: Oxycodone  HCl 10 MG TABS-PTmother [name] requesting update for medication. Mzq#[id number] Needs call back [phone]  Location:  [id number] - [place], [place] - [address] [place] [address] Phone: [phone] Fax: [phone] Hours: Not open 24 hours

## 2024-03-08 NOTE — Hospital Course (Signed)
"   44 y.o. female with medical history significant for cervical spinal cord injury, seizure disorder, chronic pain syndrome, hypothyroidism, muscle spasticity, presence of intrathecal baclofen  pump, recurrent UTIs, recent history of multidrug-resistant E. coli UTI, chronic suprapubic catheter, who presents to the ER sent from her PCPs office due to need for IV antibiotics, failed outpatient oral antibiotics regimen for the past 2-1/2 weeks.  Endorses generalized fatigue.  Denies subjective fevers or abdominal pain.   In the ER, tachycardic.  UA positive for pyuria.  Urine culture and peripheral blood cultures x 2 were obtained.  The patient received IV Merrem , according to her most recent urine culture result.  TRH, hospitalist service, was asked to admit.   "

## 2024-03-08 NOTE — Telephone Encounter (Signed)
Pt notified.    LS

## 2024-03-09 ENCOUNTER — Other Ambulatory Visit (HOSPITAL_COMMUNITY): Payer: Self-pay

## 2024-03-09 ENCOUNTER — Telehealth: Payer: Self-pay | Admitting: Family Medicine

## 2024-03-09 DIAGNOSIS — G894 Chronic pain syndrome: Secondary | ICD-10-CM | POA: Diagnosis not present

## 2024-03-09 DIAGNOSIS — L89154 Pressure ulcer of sacral region, stage 4: Secondary | ICD-10-CM | POA: Diagnosis not present

## 2024-03-09 DIAGNOSIS — N760 Acute vaginitis: Secondary | ICD-10-CM | POA: Diagnosis not present

## 2024-03-09 DIAGNOSIS — B9689 Other specified bacterial agents as the cause of diseases classified elsewhere: Secondary | ICD-10-CM | POA: Diagnosis not present

## 2024-03-09 LAB — CBC WITH DIFFERENTIAL/PLATELET
Abs Immature Granulocytes: 0.02 K/uL (ref 0.00–0.07)
Basophils Absolute: 0 K/uL (ref 0.0–0.1)
Basophils Relative: 0 %
Eosinophils Absolute: 0.2 K/uL (ref 0.0–0.5)
Eosinophils Relative: 3 %
HCT: 41.4 % (ref 36.0–46.0)
Hemoglobin: 12.9 g/dL (ref 12.0–15.0)
Immature Granulocytes: 0 %
Lymphocytes Relative: 32 %
Lymphs Abs: 2.7 K/uL (ref 0.7–4.0)
MCH: 28.1 pg (ref 26.0–34.0)
MCHC: 31.2 g/dL (ref 30.0–36.0)
MCV: 90.2 fL (ref 80.0–100.0)
Monocytes Absolute: 0.6 K/uL (ref 0.1–1.0)
Monocytes Relative: 7 %
Neutro Abs: 5 K/uL (ref 1.7–7.7)
Neutrophils Relative %: 58 %
Platelets: 405 K/uL — ABNORMAL HIGH (ref 150–400)
RBC: 4.59 MIL/uL (ref 3.87–5.11)
RDW: 13.6 % (ref 11.5–15.5)
WBC: 8.5 K/uL (ref 4.0–10.5)
nRBC: 0 % (ref 0.0–0.2)

## 2024-03-09 LAB — BASIC METABOLIC PANEL WITH GFR
Anion gap: 4 — ABNORMAL LOW (ref 5–15)
BUN: 10 mg/dL (ref 6–20)
CO2: 34 mmol/L — ABNORMAL HIGH (ref 22–32)
Calcium: 8.3 mg/dL — ABNORMAL LOW (ref 8.9–10.3)
Chloride: 103 mmol/L (ref 98–111)
Creatinine, Ser: 0.5 mg/dL (ref 0.44–1.00)
GFR, Estimated: >60 mL/min
Glucose, Bld: 101 mg/dL — ABNORMAL HIGH (ref 70–99)
Potassium: 3.8 mmol/L (ref 3.5–5.1)
Sodium: 141 mmol/L (ref 135–145)

## 2024-03-09 LAB — URINE CULTURE: Culture: >=100000 — AB

## 2024-03-09 MED ORDER — AMOXICILLIN 250 MG PO CAPS
500.0000 mg | ORAL_CAPSULE | Freq: Three times a day (TID) | ORAL | Status: DC
  Administered 2024-03-09: 500 mg via ORAL
  Filled 2024-03-09: qty 2

## 2024-03-09 MED ORDER — METRONIDAZOLE 500 MG PO TABS: 500.0000 mg | ORAL_TABLET | Freq: Two times a day (BID) | ORAL | Status: DC

## 2024-03-09 MED ORDER — METRONIDAZOLE 500 MG PO TABS: 500.0000 mg | ORAL_TABLET | Freq: Two times a day (BID) | ORAL | 0 refills | Status: DC

## 2024-03-09 MED ORDER — AMOXICILLIN 500 MG PO CAPS: 500.0000 mg | ORAL_CAPSULE | Freq: Three times a day (TID) | ORAL | 0 refills | Status: DC

## 2024-03-09 MED ORDER — OXYCODONE HCL 5 MG PO TABS
10.0000 mg | ORAL_TABLET | Freq: Three times a day (TID) | ORAL | Status: DC | PRN
  Administered 2024-03-09 (×2): 10 mg via ORAL
  Filled 2024-03-09 (×2): qty 2

## 2024-03-09 NOTE — Plan of Care (Signed)
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
## 2024-03-09 NOTE — Plan of Care (Signed)
" °  Problem: Education: Goal: Knowledge of General Education information will improve Description: Including pain rating scale, medication(s)/side effects and non-pharmacologic comfort measures 03/09/2024 1409 by Mathews Norleen POUR, RN Outcome: Adequate for Discharge 03/09/2024 0753 by Mathews Norleen POUR, RN Outcome: Progressing   Problem: Health Behavior/Discharge Planning: Goal: Ability to manage health-related needs will improve 03/09/2024 1409 by Mathews Norleen POUR, RN Outcome: Adequate for Discharge 03/09/2024 0753 by Mathews Norleen POUR, RN Outcome: Progressing   Problem: Clinical Measurements: Goal: Ability to maintain clinical measurements within normal limits will improve 03/09/2024 1409 by Mathews Norleen POUR, RN Outcome: Adequate for Discharge 03/09/2024 0753 by Mathews Norleen POUR, RN Outcome: Progressing Goal: Will remain free from infection 03/09/2024 1409 by Mathews Norleen POUR, RN Outcome: Adequate for Discharge 03/09/2024 0753 by Mathews Norleen POUR, RN Outcome: Progressing Goal: Diagnostic test results will improve 03/09/2024 1409 by Mathews Norleen POUR, RN Outcome: Adequate for Discharge 03/09/2024 0753 by Mathews Norleen POUR, RN Outcome: Progressing Goal: Respiratory complications will improve 03/09/2024 1409 by Mathews Norleen POUR, RN Outcome: Adequate for Discharge 03/09/2024 0753 by Mathews Norleen POUR, RN Outcome: Progressing Goal: Cardiovascular complication will be avoided 03/09/2024 1409 by Mathews Norleen POUR, RN Outcome: Adequate for Discharge 03/09/2024 0753 by Mathews Norleen POUR, RN Outcome: Progressing   Problem: Activity: Goal: Risk for activity intolerance will decrease 03/09/2024 1409 by Mathews Norleen POUR, RN Outcome: Adequate for Discharge 03/09/2024 0753 by Mathews Norleen POUR, RN Outcome: Progressing   Problem: Nutrition: Goal: Adequate nutrition will be maintained 03/09/2024 1409 by Mathews Norleen POUR, RN Outcome: Adequate for Discharge 03/09/2024 0753 by Mathews Norleen POUR, RN Outcome: Progressing   Problem: Coping: Goal: Level of anxiety will  decrease 03/09/2024 1409 by Mathews Norleen POUR, RN Outcome: Adequate for Discharge 03/09/2024 0753 by Mathews Norleen POUR, RN Outcome: Progressing   Problem: Elimination: Goal: Will not experience complications related to bowel motility 03/09/2024 1409 by Mathews Norleen POUR, RN Outcome: Adequate for Discharge 03/09/2024 0753 by Mathews Norleen POUR, RN Outcome: Progressing Goal: Will not experience complications related to urinary retention 03/09/2024 1409 by Mathews Norleen POUR, RN Outcome: Adequate for Discharge 03/09/2024 0753 by Mathews Norleen POUR, RN Outcome: Progressing   Problem: Pain Managment: Goal: General experience of comfort will improve and/or be controlled 03/09/2024 1409 by Mathews Norleen POUR, RN Outcome: Adequate for Discharge 03/09/2024 0753 by Mathews Norleen POUR, RN Outcome: Progressing   Problem: Safety: Goal: Ability to remain free from injury will improve 03/09/2024 1409 by Mathews Norleen POUR, RN Outcome: Adequate for Discharge 03/09/2024 0753 by Mathews Norleen POUR, RN Outcome: Progressing   Problem: Skin Integrity: Goal: Risk for impaired skin integrity will decrease 03/09/2024 1409 by Mathews Norleen POUR, RN Outcome: Adequate for Discharge 03/09/2024 0753 by Mathews Norleen POUR, RN Outcome: Progressing   "

## 2024-03-09 NOTE — Telephone Encounter (Signed)
 Patient is needing prior authorization for her oxycodone  [facility] [place].   Pharmacy stated that they are able to fill a 7 day supply for her at this time while they are waiting for the authorization from the insurance.

## 2024-03-10 ENCOUNTER — Other Ambulatory Visit (HOSPITAL_COMMUNITY): Payer: Self-pay

## 2024-03-10 ENCOUNTER — Telehealth: Payer: Self-pay

## 2024-03-10 NOTE — Transitions of Care (Post Inpatient/ED Visit) (Signed)
 "  03/10/2024  Name: [name] MRN: [id number] DOB: 11/03/1980  Today's TOC FU Call Status: Today's TOC FU Call Status:: Successful TOC FU Call Completed TOC FU Call Complete Date: 03/10/24  Patient's Name and Date of Birth confirmed. Name, DOB  Transition Care Management Follow-up Telephone Call Date of Discharge: 03/09/24 [facility] Type of Discharge: Inpatient Admission Primary Inpatient Discharge Diagnosis:: UTI How have you been since you were released from the hospital?: Same Any questions or concerns?: No  Items Reviewed: Did you receive and understand the discharge instructions provided?: Yes Medications obtained,verified, and reconciled?: Yes (Medications Reviewed) Any new allergies since your discharge?: No Dietary orders reviewed?: Yes Do you have support at home?: Yes People in Home [RPT]: parent(s)  Medications Reviewed Today: Medications Reviewed Today     Reviewed by Emmitt Pan, LPN (Licensed Practical Nurse) on 03/10/24 at 1610  Med List Status: <None>   Medication Order Taking? Sig Documenting Provider Last Dose Status Informant  acetaminophen  (TYLENOL ) 500 MG tablet [id number] Yes Take 1,000 mg by mouth daily as needed for moderate pain or headache. [provider]  Active Mother  amLODipine  (NORVASC ) 5 MG tablet [id number] Yes Take 1 tablet (5 mg total) by mouth daily. For blood pressure *dose change Jolinda Potter M, DO  Active Mother  amoxicillin  (AMOXIL ) 500 MG capsule [id number] Yes Take 1 capsule (500 mg total) by mouth every 8 (eight) hours. Evonnie Lenis, MD  Active   azelastine  (ASTELIN ) 0.1 % nasal spray [id number] Yes Place 1 spray into both nostrils 2 (two) times daily. Jolinda Potter M, DO  Active Mother  buPROPion  (WELLBUTRIN  SR) 150 MG 12 hr tablet [id number] Yes Take 1 tablet (150 mg total) by mouth 2 (two) times daily. Jolinda Potter HERO, DO  Active Mother  busPIRone  (BUSPAR ) 15 MG tablet [id number]  Yes TAKE 1 TABLET BY MOUTH TWICE A DAY Jolinda Potter HERO, DO  Active Mother  Cholecalciferol  (VITAMIN D3) 50 MCG (2000 UT) capsule [id number] Yes TAKE 1 CAPSULE BY MOUTH EVERY DAY Jolinda Potter M, DO  Active Mother  citalopram  (CELEXA ) 40 MG tablet [id number] Yes Take 1 tablet (40 mg total) by mouth daily. Jolinda Potter HERO, DO  Active Mother  clindamycin  (CLEOCIN  T) 1 % SWAB [id number] Yes Apply to affected areas twice daily for acne Jolinda Potter HERO, DO  Active Mother  [facility] VITAMIN B12 1000 MCG tablet [id number] Yes TAKE 1 TABLET BY MOUTH EVERY DAY Gottschalk, Ashly M, DO  Active Mother  cyclobenzaprine  (FLEXERIL ) 5 MG tablet [id number] Yes Take 1 tablet (5 mg total) by mouth 3 (three) times daily. Jolinda Potter M, DO  Active Mother  diclofenac  Sodium (VOLTAREN ) 1 % GEL [id number] Yes APPLY 2 GRAMS TO AFFECTED AREA 4 TIMES A DAY  Patient taking differently: Apply 2 g topically 4 (four) times daily as needed (pain).   Merlynn Niki FALCON, [name]  Active Mother  docusate sodium  (COLACE) 100 MG capsule [id number] Yes TAKE 1 CAPSULE (100 MG TOTAL) BY MOUTH EVERY 12 (TWELVE) HOURS AS NEEDED FOR MILD CONSTIPATION. Jolinda Potter HERO, DO  Active Mother  feeding supplement (ENSURE ENLIVE / ENSURE PLUS) LIQD [id number] Yes Take 237 mLs by mouth 2 (two) times daily between meals. Maree Bracken D, DO  Active Mother  fluticasone  (FLONASE ) 50 MCG/ACT nasal spray [id number] Yes SPRAY 2 SPRAYS INTO EACH NOSTRIL EVERY DAY Jolinda Potter M, DO  Active Mother  gabapentin  (NEURONTIN ) 600 MG tablet [id number] Yes Take 1 tablet (600 mg total) by mouth  in the morning, at noon, in the evening, and at bedtime. [name], DO  Active Mother  Ipratropium-Albuterol  (COMBIVENT  RESPIMAT) 20-100 MCG/ACT AERS respimat [id number] Yes INHALE 1 PUFF INTO THE LUNGS EVERY 6 HOURS AS NEEDED. [name], DO  Active Mother  Lacosamide  (VIMPAT ) 100 MG TABS [id number] Yes Take 100 mg by mouth in the morning and at bedtime.  Per neurologist [provider]  Active Mother  levocetirizine (XYZAL ) 5 MG tablet [id number] Yes TAKE 1 TABLET (5 MG TOTAL) BY MOUTH AT BEDTIME AS NEEDED FOR ALLERGIES. [name], DO  Active Mother  levothyroxine  (SYNTHROID ) 88 MCG tablet [id number] Yes Take 1 tablet (88 mcg total) by mouth daily before breakfast. [name], DO  Active Mother  Lidocaine  4 % [facility] [id number] Yes Place 1 patch onto the skin daily as needed (pain). [provider]  Active Mother  meclizine  (ANTIVERT ) 12.5 MG tablet [id number] Yes Take 1 tablet (12.5 mg total) by mouth 3 (three) times daily as needed for dizziness. Maree Bracken D, DO  Active Mother  metroNIDAZOLE  (FLAGYL ) 500 MG tablet [id number] Yes Take 1 tablet (500 mg total) by mouth every 12 (twelve) hours. Evonnie Lenis, MD  Active   nystatin  (NYAMYC ) powder [id number] Yes APPLY 1 APPLICATION TOPICALLY 3 (THREE) TIMES DAILY. Joesph Annabella HERO, [name]  Active Mother  omeprazole  (PRILOSEC) 40 MG capsule [id number] Yes TAKE 1 CAPSULE BY MOUTH TWICE A DAY McMichael, Bayley M, [place]  Active Mother  ondansetron  (ZOFRAN ) 4 MG tablet [id number] Yes Take 1 tablet (4 mg total) by mouth every 8 (eight) hours as needed for nausea or vomiting. Lavell Bari LABOR, [name]  Active Mother  Oxycodone  HCl 10 MG TABS [id number] Yes Take 1 tablet (10 mg total) by mouth in the morning, at noon, and at bedtime. 90 tabs for 30 days [name], DO  Active Mother  PAIN MANAGEMENT INTRATHECAL, IT, PUMP [id number] Yes 1 each by Intrathecal route Continuous EPIDURAL. Intrathecal (IT) medication:  Baclofen  [provider]  Active Mother            Home Care and Equipment/Supplies: Were Home Health Services Ordered?: NA Any new equipment or medical supplies ordered?: NA  Functional Questionnaire: Do you need assistance with bathing/showering or dressing?: No Do you need assistance with meal preparation?: No Do you need assistance with eating?: No Do  you have difficulty maintaining continence: No Do you need assistance with getting out of bed/getting out of a chair/moving?: No Do you have difficulty managing or taking your medications?: No  Follow up appointments reviewed: PCP Follow-up appointment confirmed?: No MD Provider Line Number:[phone] Given: No Specialist Hospital Follow-up appointment confirmed?: NA Do you need transportation to your follow-up appointment?: No Do you understand care options if your condition(s) worsen?: Yes-patient verbalized understanding    SIGNATURE Julian Lemmings, LPN [facility] Nurse Health Advisor Direct Dial [phone]  "

## 2024-03-11 ENCOUNTER — Other Ambulatory Visit (HOSPITAL_COMMUNITY): Payer: Self-pay

## 2024-03-11 ENCOUNTER — Telehealth: Payer: Self-pay | Admitting: Pharmacy Technician

## 2024-03-11 NOTE — Telephone Encounter (Signed)
 PA request has been Submitted. New Encounter has been or will be created for follow up. For additional info see Pharmacy Prior Auth telephone encounter from 03/11/24.

## 2024-03-11 NOTE — Telephone Encounter (Signed)
 Pharmacy Patient Advocate Encounter   Received notification from Pt Calls Messages that prior authorization for Oxycodone  10 mg tablets is required/requested.   Insurance verification completed.   The patient is insured through [facility] MEDICAID.   Per test claim: PA required; PA submitted to above mentioned insurance via [employer]/confirmation #/EOC [id number] W Status is pending

## 2024-03-11 NOTE — Telephone Encounter (Signed)
 Called to schedule hospital follow up appointment for Monday 03/14/2024 with Ronal Lunger at 2:45. Left message for patient to call back.

## 2024-03-11 NOTE — Telephone Encounter (Signed)
 Hospital follow up appointment made Friday, January 16th with Ronal Quant.

## 2024-03-11 NOTE — Telephone Encounter (Signed)
 Not sure why this is being sent to me as she is not on my schedule.  However, please accommodate so she can see whoever is available on Monday.  Lanette, please make sure to send request like this in the future to the appropriate pool so that patient's care is not delayed

## 2024-03-12 LAB — CULTURE, BLOOD (ROUTINE X 2)
Culture: NO GROWTH
Culture: NO GROWTH
Special Requests: ADEQUATE
Special Requests: ADEQUATE

## 2024-03-14 ENCOUNTER — Telehealth: Payer: Self-pay | Admitting: Family Medicine

## 2024-03-14 DIAGNOSIS — G8929 Other chronic pain: Secondary | ICD-10-CM

## 2024-03-14 DIAGNOSIS — S34101D Unspecified injury to L1 level of lumbar spinal cord, subsequent encounter: Secondary | ICD-10-CM

## 2024-03-14 DIAGNOSIS — G8220 Paraplegia, unspecified: Secondary | ICD-10-CM

## 2024-03-14 NOTE — Telephone Encounter (Signed)
 [place] I have no idea of the update on her prior authorization.  This would be best sent to the prior authorization team to double check on this as they were actively working on this in January.  I see no record that this has been approved in the EMR so it is probably worth contacting them directly.  Please let me know once that information is available and I will gladly submit a new prescription for the remaining 23 days of medication

## 2024-03-14 NOTE — Telephone Encounter (Signed)
 Copied from CRM #[id number]. Topic: Clinical - Prescription Issue >> Mar 08, 2024  8:59 AM Miquel SAILOR wrote: Reason for CRM: Oxycodone  HCl 10 MG TABS-PTmother [name] requesting update for medication. Mzq#[id number] Needs call back [phone]  Location:  [id number] [place] [place] - [address] [place] [address] Phone: [phone] Fax: [phone] Hours: Not open 24 hours >> Mar 14, 2024 12:32 PM Antwanette L wrote: [name], the patients mother, called requesting an update on the prior authorization for Oxycodone  HCl 10 mg tablets. The patient currently has a 7-day supply, and an authorization is required for an additional 23-day supply. The patient will be out of medication tomorrow. Patient mother cannot pay for the medicine unless the authorization was denied and pharmacy has to notified of the denial. [name] is requesting a callback at [phone]

## 2024-03-15 ENCOUNTER — Other Ambulatory Visit (HOSPITAL_COMMUNITY): Payer: Self-pay

## 2024-03-15 ENCOUNTER — Telehealth: Payer: Self-pay | Admitting: Family Medicine

## 2024-03-15 ENCOUNTER — Other Ambulatory Visit: Payer: Self-pay | Admitting: Family Medicine

## 2024-03-15 MED ORDER — OXYCODONE HCL 10 MG PO TABS: 10.0000 mg | ORAL_TABLET | Freq: Three times a day (TID) | ORAL | 0 refills | Status: DC

## 2024-03-15 NOTE — Telephone Encounter (Signed)
 I called and spoke with Davina at the pharmacy and she confirmed that patient picked up a 7 day supply of the Oxycodone  Rx on 03/09/2024 so they need Dr Jolinda to send in a new Rx for the remaining 23 days.

## 2024-03-15 NOTE — Telephone Encounter (Unsigned)
 Copied from CRM [id number]. Topic: Clinical - Medication Refill >> Mar 15, 2024  9:39 AM Gustabo D wrote: Medication: Oxycodone  HCl 10 MG TABS  Has the patient contacted their pharmacy? No (Agent: If no, request that the patient contact the pharmacy for the refill. If patient does not wish to contact the pharmacy document the reason why and proceed with request.) (Agent: If yes, when and what did the pharmacy advise?)  This is the patient's preferred pharmacy:  [id number] [place], [place] - [address] [place] [address] Phone: [phone] Fax: [phone]  Is this the correct pharmacy for this prescription? Yes If no, delete pharmacy and type the correct one.   Has the prescription been filled recently? No  Is the patient out of the medication? Yes  Has the patient been seen for an appointment in the last year OR does the patient have an upcoming appointment? Yes  Can we respond through MyChart? No  Agent: Please be advised that Rx refills may take up to 3 business days. We ask that you follow-up with your pharmacy.

## 2024-03-15 NOTE — Telephone Encounter (Signed)
 DUPLICATE message. Already addressed and sent to PCP to advise on.

## 2024-03-15 NOTE — Telephone Encounter (Signed)
 Test claim shows copay $0 for #90 tabs for 30 day supply.   Unsure of approval date for PA - unable to check PA in [employer].

## 2024-03-15 NOTE — Addendum Note (Signed)
 Addended by: [name] on: 03/15/2024 12:08 PM   Modules accepted: Orders

## 2024-03-15 NOTE — Telephone Encounter (Signed)
 Copied from CRM [id number]. Topic: Clinical - Medication Refill >> Mar 15, 2024  9:39 AM Gustabo D wrote: Medication: Oxycodone  HCl 10 MG TABS  Has the patient contacted their pharmacy? No (Agent: If no, request that the patient contact the pharmacy for the refill. If patient does not wish to contact the pharmacy document the reason why and proceed with request.) (Agent: If yes, when and what did the pharmacy advise?)  This is the patient's preferred pharmacy:  [id number] [place], [place] - [address] [place] [address] Phone: [phone] Fax: [phone]  Is this the correct pharmacy for this prescription? Yes If no, delete pharmacy and type the correct one.   Has the prescription been filled recently? No  Is the patient out of the medication? Yes  Has the patient been seen for an appointment in the last year OR does the patient have an upcoming appointment? Yes  Can we respond through MyChart? No  Agent: Please be advised that Rx refills may take up to 3 business days. We ask that you follow-up with your pharmacy.

## 2024-03-15 NOTE — Telephone Encounter (Signed)
 done

## 2024-03-17 ENCOUNTER — Encounter (HOSPITAL_BASED_OUTPATIENT_CLINIC_OR_DEPARTMENT_OTHER): Attending: General Surgery | Admitting: General Surgery

## 2024-03-17 DIAGNOSIS — L89154 Pressure ulcer of sacral region, stage 4: Secondary | ICD-10-CM | POA: Diagnosis present

## 2024-03-17 DIAGNOSIS — G8222 Paraplegia, incomplete: Secondary | ICD-10-CM | POA: Insufficient documentation

## 2024-03-18 ENCOUNTER — Encounter: Payer: Self-pay | Admitting: Family Medicine

## 2024-03-18 ENCOUNTER — Inpatient Hospital Stay: Admitting: Nurse Practitioner

## 2024-03-18 ENCOUNTER — Telehealth: Payer: Self-pay | Admitting: Family Medicine

## 2024-03-18 NOTE — Telephone Encounter (Unsigned)
 Copied from CRM [id number]. Topic: Appointments - Appointment Cancel/Reschedule >> Mar 18, 2024  9:17 AM Diannia H wrote: Patient/patient representative is calling to cancel or reschedule an appointment. Patients mom is calling because she did not have a ride because transportation did not show up. She has a 5 day window to even get a ride to come to the visit. Could you assist her with getting a sooner appt and letting the transportation know its an emergency visit that way they will pick her up before those 5 day window. Could you assist? Patients callback number is [phone]   The appt can be faxed to [phone]

## 2024-03-18 NOTE — Telephone Encounter (Signed)
 I called pt & spoke to her Mom to r/s appt to next week for [facility] f/u.

## 2024-03-21 ENCOUNTER — Ambulatory Visit: Payer: Self-pay

## 2024-03-21 NOTE — Telephone Encounter (Signed)
 Pt scheduled with DOD tomorrow at 8:35 for virtual.

## 2024-03-21 NOTE — Telephone Encounter (Signed)
 She absolutely needs to have an appointment for this type of issue.  It is okay for this to be done virtually but we cannot order labs and/or medications outside an office visit as this is office policy

## 2024-03-21 NOTE — Telephone Encounter (Signed)
 FYI Only or Action Required?: Action required by provider: clinical question for provider.  Patient was last seen in primary care on 02/12/2024 by Jamie Mustard, [name].  Called Nurse Triage reporting Hematuria.  Symptoms began yesterday.  Interventions attempted: Nothing.  Symptoms are: unchanged.  Triage Disposition: See Physician Within 24 Hours  Patient/caregiver understands and will follow disposition?: No, wishes to speak with PCP  Reason for Disposition  Side (flank) or back pain present  Answer Assessment - Initial Assessment Questions [name] calling in today. Pt has a urostomy and hx UTI's. Hospital f/u 03/25/24. Mom stated she has brought in samples before and is calling in to see if she can bring in a urine sample today prior to visit. Pt is unable to come into office due to needing specific transportation set up.  Please advise. [name] requesting callback [phone].   1. COLOR of URINE: Describe the color of the urine.  (e.g., tea-colored, pink, red, bloody) Do you have blood clots in your urine? (e.g., none, pea, grape, small coin)     Orange-ish tea colored, noticing white junk floating around  2. ONSET: When did the bleeding start?      On going, went away and noticed it again yesterday  3. FEVER: Do you have a fever? If Yes, ask: What is your temperature, how was it measured, and when did it start?     Unable to assess  4. OTHER SYMPTOMS: Do you have any other symptoms? (e.g., back/flank pain, abdomen pain, vomiting)     Lower back pain, rash around stoma  Protocols used: Urine - Blood In-A-AH    Message from Alfonso ORN sent at 03/21/2024 10:46 AM EST  Summary: urine in blood   Reason for Triage:  urine still looks bad and has blood in the urine  with stuff floating in the urine ,severe backache pain and  rash on her stona patient mom [name] on phone want to know  if can bring patient urine sample in the office

## 2024-03-22 ENCOUNTER — Telehealth: Admitting: Family Medicine

## 2024-03-22 ENCOUNTER — Encounter: Payer: Self-pay | Admitting: Family Medicine

## 2024-03-22 ENCOUNTER — Telehealth: Payer: Self-pay | Admitting: Family Medicine

## 2024-03-22 DIAGNOSIS — R300 Dysuria: Secondary | ICD-10-CM | POA: Diagnosis not present

## 2024-03-22 DIAGNOSIS — R1030 Lower abdominal pain, unspecified: Secondary | ICD-10-CM

## 2024-03-22 DIAGNOSIS — G8220 Paraplegia, unspecified: Secondary | ICD-10-CM

## 2024-03-22 DIAGNOSIS — N390 Urinary tract infection, site not specified: Secondary | ICD-10-CM | POA: Diagnosis not present

## 2024-03-22 DIAGNOSIS — T1490XS Injury, unspecified, sequela: Secondary | ICD-10-CM

## 2024-03-22 LAB — URINALYSIS, ROUTINE W REFLEX MICROSCOPIC
Bilirubin, UA: NEGATIVE
Glucose, UA: NEGATIVE
Ketones, UA: NEGATIVE
Nitrite, UA: POSITIVE — AB
Protein,UA: NEGATIVE
RBC, UA: NEGATIVE
Specific Gravity, UA: 1.015 (ref 1.005–1.030)
Urobilinogen, Ur: 0.2 mg/dL (ref 0.2–1.0)
pH, UA: 7 (ref 5.0–7.5)

## 2024-03-22 LAB — MICROSCOPIC EXAMINATION: RBC, Urine: NONE SEEN /HPF (ref 0–2)

## 2024-03-22 MED ORDER — SULFAMETHOXAZOLE-TRIMETHOPRIM 800-160 MG PO TABS: 1.0000 | ORAL_TABLET | Freq: Two times a day (BID) | ORAL | 0 refills | Status: DC

## 2024-03-22 NOTE — Progress Notes (Signed)
 "   Subjective:    [name], female    DOB: Oct 11, 1980, 44 y.o.   MRN: [id number]   HPI: [name] is a 44 y.o. female presenting for  Discussed the use of AI scribe software for clinical note transcription with the patient, who gave verbal consent to proceed.  History of Present Illness [name] is a 44 year old female with paraplegia who presents with symptoms of a urinary tract infection. She is accompanied by her mother.  She has persistent symptoms of a urinary tract infection, including the presence of a white tissue paper-like substance and hematuria. 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 These symptoms have been ongoing and did not resolve after a recent three-day hospital stay.  She experiences lower back pain, fatigue, and diffuse abdominal pain. Additionally, she has headaches. Despite these symptoms, she does not develop a fever, even during episodes of sepsis.  Due to her paraplegia, transportation issues have delayed her ability to provide a urine specimen for analysis.       12/28/2023    1:38 PM 09/23/2023    1:06 PM 06/24/2023    1:29 PM 12/25/2022    3:44 PM 12/22/2022   10:27 AM  Depression screen PHQ 2/9  Decreased Interest 0 0 0 0 0  Down, Depressed, Hopeless 0 0 0 0 0  PHQ - 2 Score 0 0 0 0 0  Altered sleeping 0 0 0 0 1  Tired, decreased energy 0 0 0 0 1  Change in appetite 0 0 0 0 0  Feeling bad or failure about yourself  0 0 0 0 0  Trouble concentrating 0 0 0 0 0  Moving slowly or fidgety/restless 0 0 0 0 0  Suicidal thoughts 0 0 0 0 0  PHQ-9 Score 0  0  0  0  2   Difficult doing work/chores Not difficult at all Not difficult at all Not difficult at all  Somewhat difficult     Data saved with a previous flowsheet row definition     Relevant past medical, surgical, family and social history reviewed and updated as indicated.  Interim medical history since our  last visit reviewed. Allergies and medications reviewed and updated.  ROS:  Review of Systems  Gastrointestinal:  Positive for abdominal pain (mild to moderate, diffuse).  Genitourinary:  Positive for difficulty urinating (indwelling suprapubic catheter).  Musculoskeletal:  Positive for gait problem (paraplegia).     Tobacco Use History[1]     Objective:     Wt Readings from Last 3 Encounters:  03/06/24 214 lb 15.2 oz (97.5 kg)  02/06/23 215 lb (97.5 kg)  10/06/22 215 lb 6.4 oz (97.7 kg)     Exam deferred. Video visit performed.   Urine has positive nitrite 11-30 WBC and many bacteria  Assessment & Plan:  Dysuria -     Urine Culture -     Urinalysis, Routine w reflex microscopic  Post-traumatic paraplegia (HCC)  Lower abdominal pain  Complicated UTI (urinary tract infection)  Other orders -     Sulfamethoxazole -Trimethoprim ; Take 1 tablet by mouth 2 (two) times daily.  Dispense: 14 tablet; Refill: 0      Diagnoses and all orders for this visit:  Dysuria -     Urine Culture -     Urinalysis, Routine w reflex microscopic  Post-traumatic paraplegia (HCC)  Lower abdominal pain  Complicated UTI (  urinary tract infection)  Other orders -     sulfamethoxazole -trimethoprim  (BACTRIM  DS) 800-160 MG tablet; Take 1 tablet by mouth 2 (two) times daily.  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 No fever is reported, even during sepsis. A white tissue paper-like substance in the urine may indicate contamination or other issues. Collect a urine specimen from the tube as close to the stoma as possible to ensure sterility and submit it for analysis. Await urine analysis results to determine appropriate antibiotic treatment. She will be contacted by phone with  results and the treatment plan.  Irritant contact dermatitis of urostomy stoma   There is a rash around the urostomy stoma on the stomach, likely due to irritant contact dermatitis, associated with the stoma opening where the tube exits.    Virtual Visit  Note  I discussed the limitations, risks, security and privacy concerns of performing an evaluation and management service by video and the availability of in person appointments. The patient was identified with two identifiers. Pt.expressed understanding and agreed to proceed. Pt. Is at home. Dr. Zollie is in his office.  Follow Up Instructions:   I discussed the assessment and treatment plan with the patient. The patient was provided an opportunity to ask questions and all were answered. The patient agreed with the plan and demonstrated an understanding of the instructions.   The patient was advised to call back or seek an in-person evaluation if the symptoms worsen or if the condition fails to improve as anticipated.   Follow up plan: Return if symptoms worsen or fail to improve.  Butler Zollie, MD [facility]     [1]  Social History Tobacco Use  Smoking Status Former   Current packs/day: 0.00   Average packs/day: 2.0 packs/day for 22.0 years (44.0 ttl pk-yrs)   Types: Cigarettes   Start date: 10/29/1997   Quit date: 10/30/2019   Years since quitting: 4.3  Smokeless Tobacco Never   "

## 2024-03-22 NOTE — Telephone Encounter (Unsigned)
 Copied from CRM [id number]. Topic: General - Other >> Mar 22, 2024  8:52 AM Gustabo D wrote: Pt is waiting for her appt to start

## 2024-03-22 NOTE — Telephone Encounter (Signed)
 Will start when available

## 2024-03-25 ENCOUNTER — Ambulatory Visit: Admitting: Nurse Practitioner

## 2024-03-25 LAB — URINE CULTURE

## 2024-03-28 ENCOUNTER — Ambulatory Visit: Payer: Self-pay | Admitting: Family Medicine

## 2024-03-28 MED ORDER — AMOXICILLIN 500 MG PO CAPS: 500.0000 mg | ORAL_CAPSULE | Freq: Three times a day (TID) | ORAL | 0 refills | Status: AC

## 2024-03-28 NOTE — Progress Notes (Signed)
Your urine culture shows the presence of a germ that is resistant to the current antibiotic you are taking. Please discontinue that medication and take the new one I have sent to your pharmacy.  Best Regards,  , M.D.  

## 2024-03-30 ENCOUNTER — Encounter: Payer: Self-pay | Admitting: Family Medicine

## 2024-03-30 ENCOUNTER — Ambulatory Visit (INDEPENDENT_AMBULATORY_CARE_PROVIDER_SITE_OTHER): Payer: Self-pay | Admitting: Family Medicine

## 2024-03-30 VITALS — BP 133/83 | HR 97 | Temp 98.4°F

## 2024-03-30 DIAGNOSIS — Z8639 Personal history of other endocrine, nutritional and metabolic disease: Secondary | ICD-10-CM

## 2024-03-30 DIAGNOSIS — S34101D Unspecified injury to L1 level of lumbar spinal cord, subsequent encounter: Secondary | ICD-10-CM

## 2024-03-30 DIAGNOSIS — N319 Neuromuscular dysfunction of bladder, unspecified: Secondary | ICD-10-CM | POA: Diagnosis not present

## 2024-03-30 DIAGNOSIS — H01005 Unspecified blepharitis left lower eyelid: Secondary | ICD-10-CM

## 2024-03-30 DIAGNOSIS — S31000D Unspecified open wound of lower back and pelvis without penetration into retroperitoneum, subsequent encounter: Secondary | ICD-10-CM

## 2024-03-30 DIAGNOSIS — G8220 Paraplegia, unspecified: Secondary | ICD-10-CM | POA: Diagnosis not present

## 2024-03-30 DIAGNOSIS — G8929 Other chronic pain: Secondary | ICD-10-CM

## 2024-03-30 MED ORDER — VITAMIN D3 50 MCG (2000 UT) PO CAPS: 2000.0000 [IU] | ORAL_CAPSULE | Freq: Every day | ORAL | 3 refills | Status: AC

## 2024-03-30 MED ORDER — KETOROLAC TROMETHAMINE 30 MG/ML IJ SOLN
30.0000 mg | Freq: Once | INTRAMUSCULAR | Status: AC
  Administered 2024-03-30: 30 mg via INTRAMUSCULAR

## 2024-03-30 MED ORDER — OXYCODONE HCL 10 MG PO TABS: 10.0000 mg | ORAL_TABLET | Freq: Three times a day (TID) | ORAL | 0 refills | Status: AC

## 2024-03-30 MED ORDER — OMEPRAZOLE 40 MG PO CPDR: 40.0000 mg | DELAYED_RELEASE_CAPSULE | Freq: Two times a day (BID) | ORAL | 3 refills | Status: AC

## 2024-03-30 MED ORDER — ERYTHROMYCIN 5 MG/GM OP OINT: 1.0000 | TOPICAL_OINTMENT | Freq: Three times a day (TID) | OPHTHALMIC | 0 refills | Status: AC
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
## 2024-04-04 ENCOUNTER — Telehealth: Payer: Self-pay | Admitting: *Deleted

## 2024-04-07 ENCOUNTER — Telehealth: Payer: Self-pay | Admitting: Family Medicine

## 2024-04-07 NOTE — Telephone Encounter (Signed)
 [facility] r/c cannot assist patient

## 2024-04-07 NOTE — Telephone Encounter (Unsigned)
 Copied from CRM #[id number]. Topic: General - Other >> Apr 07, 2024  9:23 AM Wess RAMAN wrote: Reason for CRM: Patient's mother, [name], is requesting That the [facility] referral in progress be done ASAP because she will be down for two weeks after her surgery. I advised her that staff is currently working on it and waiting for another provider to sign PCS form from [facility] to [facility]  Callback #: [id number] >> Apr 07, 2024  1:57 PM Delon T wrote: Patient does  not need another aide, she needs skilled nursing for her wound and ostomy bag for her stoma, the home aide is  not allowed to touch it. Please call mother [phone]

## 2024-04-07 NOTE — Telephone Encounter (Signed)
 Aware PCS paperwork has been faxed to [place] [facility]

## 2024-04-07 NOTE — Telephone Encounter (Signed)
 I called [facility] to follow up and the rep said she would call back with an update. Referral sent to [facility] [place], [place]): [facility]) Phone [phone] [facility]) Bernarda Kitty in [place] Fax [phone] MEDICAID ONLY Faxed referral information Release: [id number] [facility]

## 2024-04-07 NOTE — Telephone Encounter (Signed)
 [facility] HH referral looks to be still in progress. I am working on AVAYA form from [facility] to [facility] with [facility] listed as agency, waiting on another provider to sign since PCP is out of the office.

## 2024-04-07 NOTE — Telephone Encounter (Signed)
 I called [facility] to follow up and the rep said she would call back with an update.

## 2024-04-07 NOTE — Telephone Encounter (Signed)
 I sent the PCS form to [place] [facility] as an Expedited assessment  Can you please advise on the [facility] referral

## 2024-04-07 NOTE — Telephone Encounter (Signed)
 Due to the patient's insurance they are not a good payor for [facility] and have been unable to get patient accepted with the agencies.

## 2024-04-07 NOTE — Telephone Encounter (Unsigned)
 Copied from CRM #[id number]. Topic: General - Other >> Apr 07, 2024  9:23 AM Wess RAMAN wrote: Reason for CRM: Patient's mother, [name], is requesting That the [facility] referral in progress be done ASAP because she will be down for two weeks after her surgery. I advised her that staff is currently working on it and waiting for another provider to sign PCS form from [facility] to [facility]  Callback #: [id number]

## 2024-04-07 NOTE — Telephone Encounter (Signed)
 Copied from CRM [id number]. Topic: Referral - Status >> Apr 06, 2024 11:29 AM [name] wrote: Reason for CRM: Pts mother is following up on the home health aide that was requested. She is having surgery on Friday and she needs to make sure that this is scheduled.

## 2024-04-08 ENCOUNTER — Telehealth: Payer: Self-pay | Admitting: Family Medicine

## 2024-04-08 ENCOUNTER — Other Ambulatory Visit: Payer: Self-pay | Admitting: Family Medicine

## 2024-04-08 ENCOUNTER — Telehealth: Payer: Self-pay

## 2024-04-08 DIAGNOSIS — S34101D Unspecified injury to L1 level of lumbar spinal cord, subsequent encounter: Secondary | ICD-10-CM

## 2024-04-08 DIAGNOSIS — T1490XS Injury, unspecified, sequela: Secondary | ICD-10-CM

## 2024-04-08 DIAGNOSIS — G8929 Other chronic pain: Secondary | ICD-10-CM

## 2024-04-08 NOTE — Telephone Encounter (Signed)
 Copied from CRM [id number]. Topic: Clinical - Medication Prior Auth >> Apr 08, 2024  8:47 AM Everette C wrote: Reason for CRM: The patient's mother has called to request that a prior authorization be submitted for the patient's prescription for Oxycodone  HCl 10 MG TABS [[id number]]  Please contact [phone]  Please contact the patient's mother or aunt at [phone] when possible if additional information is needed. The patient's mother will be having a procedure on their neck this morning and be me temporarily unavailable to be reached.  Please contact further when possible

## 2024-04-08 NOTE — Telephone Encounter (Signed)
 LMOVM RE: pt's Oxycodone  [facility], the 1st one dated to be filled today, then the next on 05/06/24 & last on 06/06/24  RE: Aide/Care/PCS - I called [place] [facility] they did receive the form/referral, pt is already on the CAPS program so they are unable to receive PCS too. Instructed on VM for them to reach out to CAPS case manager Boby Comings about getting additional help.

## 2024-04-08 NOTE — Telephone Encounter (Signed)
 Copied from CRM [id number]. Topic: Clinical - Medication Refill >> Mar 15, 2024  9:39 AM Gustabo D wrote: Medication: Oxycodone  HCl 10 MG TABS  Has the patient contacted their pharmacy? No (Agent: If no, request that the patient contact the pharmacy for the refill. If patient does not wish to contact the pharmacy document the reason why and proceed with request.) (Agent: If yes, when and what did the pharmacy advise?)  This is the patient's preferred pharmacy:  [id number] [place] [place] - [address] [place] [address] Phone: [phone] Fax: [phone]  Is this the correct pharmacy for this prescription? Yes If no, delete pharmacy and type the correct one.   Has the prescription been filled recently? No  Is the patient out of the medication? Yes  Has the patient been seen for an appointment in the last year OR does the patient have an upcoming appointment? Yes  Can we respond through MyChart? No  Agent: Please be advised that Rx refills may take up to 3 business days. We ask that you follow-up with your pharmacy. >> Apr 08, 2024  8:28 AM Miquel SAILOR wrote: Oxycodone  HCl 10 MG TABS     PT mother Edna stated PT had visit on 03/30/24 for PCP put in. ASAP. Needs call back [phone] or [phone] Kyra

## 2024-04-08 NOTE — Telephone Encounter (Unsigned)
 Copied from CRM [id number]. Topic: Clinical - Medication Prior Auth >> Apr 08, 2024  8:47 AM Everette C wrote: Reason for CRM: The patient's mother has called to request that a prior authorization be submitted for the patient's prescription for Oxycodone  HCl 10 MG TABS [[id number]]  Please contact [phone]  Please contact the patient's mother or aunt at [phone] when possible if additional information is needed. The patient's mother will be having a procedure on their neck this morning and be me temporarily unavailable to be reached.  Please contact further when possible >> Apr 08, 2024  2:16 PM Susanna ORN wrote: Patient's mother, [name], called to check if the PA had been submitted to [facility]. Informed her that the message was routed but there is no update yet. States her daughter keeps getting messages from pharmacy.  >> Apr 08, 2024  9:02 AM Alfonso ORN wrote: Mother called back and ask when call back to call the patient WILHEMENIA CAMBA at [phone]  do not call the [phone] , The patient's mother will be having a procedure on their neck this morning and be me temporarily unavailable to be reached.

## 2024-04-14 ENCOUNTER — Encounter (HOSPITAL_BASED_OUTPATIENT_CLINIC_OR_DEPARTMENT_OTHER): Admitting: General Surgery

## 2024-07-05 ENCOUNTER — Ambulatory Visit: Admitting: Family Medicine
